# Patient Record
Sex: Male | Born: 1935 | Race: White | Hispanic: No | Smoking: Former smoker
Health system: Southern US, Community
[De-identification: ages and names within clinical notes are randomized; demographics above are authoritative.]

## PROBLEM LIST (undated history)

## (undated) DIAGNOSIS — G4700 Insomnia, unspecified: Secondary | ICD-10-CM

## (undated) DIAGNOSIS — I729 Aneurysm of unspecified site: Secondary | ICD-10-CM

## (undated) DIAGNOSIS — Z5189 Encounter for other specified aftercare: Secondary | ICD-10-CM

## (undated) DIAGNOSIS — I2510 Atherosclerotic heart disease of native coronary artery without angina pectoris: Secondary | ICD-10-CM

## (undated) DIAGNOSIS — C801 Malignant (primary) neoplasm, unspecified: Secondary | ICD-10-CM

## (undated) DIAGNOSIS — E663 Overweight: Secondary | ICD-10-CM

## (undated) DIAGNOSIS — I4510 Unspecified right bundle-branch block: Secondary | ICD-10-CM

## (undated) DIAGNOSIS — I10 Essential (primary) hypertension: Secondary | ICD-10-CM

## (undated) DIAGNOSIS — N189 Chronic kidney disease, unspecified: Secondary | ICD-10-CM

## (undated) DIAGNOSIS — I4891 Unspecified atrial fibrillation: Secondary | ICD-10-CM

## (undated) DIAGNOSIS — G4730 Sleep apnea, unspecified: Secondary | ICD-10-CM

## (undated) DIAGNOSIS — R001 Bradycardia, unspecified: Secondary | ICD-10-CM

## (undated) DIAGNOSIS — K219 Gastro-esophageal reflux disease without esophagitis: Secondary | ICD-10-CM

## (undated) DIAGNOSIS — Z79818 Long term (current) use of other agents affecting estrogen receptors and estrogen levels: Secondary | ICD-10-CM

## (undated) DIAGNOSIS — N62 Hypertrophy of breast: Secondary | ICD-10-CM

## (undated) DIAGNOSIS — C61 Malignant neoplasm of prostate: Secondary | ICD-10-CM

## (undated) DIAGNOSIS — N200 Calculus of kidney: Secondary | ICD-10-CM

## (undated) DIAGNOSIS — I5030 Unspecified diastolic (congestive) heart failure: Secondary | ICD-10-CM

## (undated) DIAGNOSIS — N529 Male erectile dysfunction, unspecified: Secondary | ICD-10-CM

## (undated) DIAGNOSIS — N1330 Unspecified hydronephrosis: Secondary | ICD-10-CM

## (undated) DIAGNOSIS — G43909 Migraine, unspecified, not intractable, without status migrainosus: Secondary | ICD-10-CM

## (undated) DIAGNOSIS — R739 Hyperglycemia, unspecified: Secondary | ICD-10-CM

## (undated) DIAGNOSIS — I639 Cerebral infarction, unspecified: Secondary | ICD-10-CM

## (undated) DIAGNOSIS — R972 Elevated prostate specific antigen [PSA]: Secondary | ICD-10-CM

## (undated) DIAGNOSIS — L719 Rosacea, unspecified: Secondary | ICD-10-CM

## (undated) DIAGNOSIS — Z9289 Personal history of other medical treatment: Secondary | ICD-10-CM

## (undated) DIAGNOSIS — Z8669 Personal history of other diseases of the nervous system and sense organs: Secondary | ICD-10-CM

## (undated) DIAGNOSIS — M8580 Other specified disorders of bone density and structure, unspecified site: Secondary | ICD-10-CM

## (undated) DIAGNOSIS — IMO0002 Reserved for concepts with insufficient information to code with codable children: Secondary | ICD-10-CM

## (undated) DIAGNOSIS — E785 Hyperlipidemia, unspecified: Secondary | ICD-10-CM

## (undated) HISTORY — DX: Other specified disorders of bone density and structure, unspecified site: M85.80

## (undated) HISTORY — DX: Calculus of kidney: N20.0

## (undated) HISTORY — DX: Cerebral infarction, unspecified: I63.9

## (undated) HISTORY — DX: Personal history of other diseases of the nervous system and sense organs: Z86.69

## (undated) HISTORY — DX: Bradycardia, unspecified: R00.1

## (undated) HISTORY — DX: Migraine, unspecified, not intractable, without status migrainosus: G43.909

## (undated) HISTORY — DX: Hypertrophy of breast: N62

## (undated) HISTORY — DX: Unspecified hydronephrosis: N13.30

## (undated) HISTORY — DX: Overweight: E66.3

## (undated) HISTORY — DX: Rosacea, unspecified: L71.9

## (undated) HISTORY — DX: Gastro-esophageal reflux disease without esophagitis: K21.9

## (undated) HISTORY — DX: Elevated prostate specific antigen (PSA): R97.20

## (undated) HISTORY — DX: Malignant (primary) neoplasm, unspecified: C80.1

## (undated) HISTORY — DX: Essential (primary) hypertension: I10

## (undated) HISTORY — DX: Hyperlipidemia, unspecified: E78.5

## (undated) HISTORY — DX: Male erectile dysfunction, unspecified: N52.9

## (undated) HISTORY — DX: Encounter for other specified aftercare: Z51.89

## (undated) HISTORY — DX: Insomnia, unspecified: G47.00

## (undated) HISTORY — DX: Hyperglycemia, unspecified: R73.9

## (undated) HISTORY — DX: Long term (current) use of other agents affecting estrogen receptors and estrogen levels: Z79.818

## (undated) HISTORY — DX: Personal history of other medical treatment: Z92.89

## (undated) HISTORY — DX: Unspecified diastolic (congestive) heart failure: I50.30

## (undated) HISTORY — DX: Chronic kidney disease, unspecified: N18.9

## (undated) HISTORY — PX: CARDIAC CATHETERIZATION: SHX172

## (undated) HISTORY — DX: Aneurysm of unspecified site: I72.9

## (undated) HISTORY — DX: Unspecified right bundle-branch block: I45.10

## (undated) HISTORY — DX: Malignant neoplasm of prostate: C61

## (undated) HISTORY — DX: Unspecified atrial fibrillation: I48.91

## (undated) HISTORY — DX: Reserved for concepts with insufficient information to code with codable children: IMO0002

---

## 1976-07-05 HISTORY — PX: VAGOTOMY: SUR1431

## 1998-10-29 ENCOUNTER — Ambulatory Visit (HOSPITAL_COMMUNITY): Admission: RE | Admit: 1998-10-29 | Discharge: 1998-10-29 | Payer: Self-pay | Admitting: Gastroenterology

## 1998-10-30 ENCOUNTER — Ambulatory Visit (HOSPITAL_COMMUNITY): Admission: RE | Admit: 1998-10-30 | Discharge: 1998-10-30 | Payer: Self-pay | Admitting: Gastroenterology

## 1998-10-31 ENCOUNTER — Ambulatory Visit (HOSPITAL_COMMUNITY): Admission: RE | Admit: 1998-10-31 | Discharge: 1998-10-31 | Payer: Self-pay | Admitting: Gastroenterology

## 1999-10-26 ENCOUNTER — Inpatient Hospital Stay (HOSPITAL_COMMUNITY): Admission: EM | Admit: 1999-10-26 | Discharge: 1999-10-30 | Payer: Self-pay | Admitting: Emergency Medicine

## 1999-10-26 ENCOUNTER — Encounter (INDEPENDENT_AMBULATORY_CARE_PROVIDER_SITE_OTHER): Payer: Self-pay

## 2002-11-27 ENCOUNTER — Inpatient Hospital Stay (HOSPITAL_COMMUNITY): Admission: AC | Admit: 2002-11-27 | Discharge: 2002-11-30 | Payer: Self-pay

## 2002-11-27 ENCOUNTER — Encounter: Payer: Self-pay | Admitting: Emergency Medicine

## 2003-07-23 ENCOUNTER — Ambulatory Visit (HOSPITAL_COMMUNITY): Admission: RE | Admit: 2003-07-23 | Discharge: 2003-07-23 | Payer: Self-pay | Admitting: Cardiology

## 2003-12-09 HISTORY — PX: PROSTATE BIOPSY: SHX241

## 2003-12-19 ENCOUNTER — Ambulatory Visit: Admission: RE | Admit: 2003-12-19 | Discharge: 2004-02-10 | Payer: Self-pay | Admitting: Radiation Oncology

## 2004-07-05 DIAGNOSIS — C801 Malignant (primary) neoplasm, unspecified: Secondary | ICD-10-CM

## 2004-07-05 HISTORY — DX: Malignant (primary) neoplasm, unspecified: C80.1

## 2004-07-29 ENCOUNTER — Ambulatory Visit: Admission: RE | Admit: 2004-07-29 | Discharge: 2004-08-14 | Payer: Self-pay | Admitting: Radiation Oncology

## 2004-10-21 ENCOUNTER — Emergency Department (HOSPITAL_COMMUNITY): Admission: EM | Admit: 2004-10-21 | Discharge: 2004-10-21 | Payer: Self-pay | Admitting: Emergency Medicine

## 2005-04-26 ENCOUNTER — Inpatient Hospital Stay (HOSPITAL_COMMUNITY): Admission: EM | Admit: 2005-04-26 | Discharge: 2005-04-27 | Payer: Self-pay | Admitting: Emergency Medicine

## 2005-04-30 ENCOUNTER — Ambulatory Visit (HOSPITAL_COMMUNITY): Admission: RE | Admit: 2005-04-30 | Discharge: 2005-04-30 | Payer: Self-pay | Admitting: Neurosurgery

## 2005-06-21 ENCOUNTER — Ambulatory Visit (HOSPITAL_COMMUNITY): Admission: RE | Admit: 2005-06-21 | Discharge: 2005-06-21 | Payer: Self-pay | Admitting: Urology

## 2005-07-02 ENCOUNTER — Ambulatory Visit: Admission: RE | Admit: 2005-07-02 | Discharge: 2005-08-12 | Payer: Self-pay | Admitting: Urology

## 2006-09-19 ENCOUNTER — Encounter: Admission: RE | Admit: 2006-09-19 | Discharge: 2006-09-19 | Payer: Self-pay | Admitting: Internal Medicine

## 2007-03-30 ENCOUNTER — Encounter: Admission: RE | Admit: 2007-03-30 | Discharge: 2007-03-30 | Payer: Self-pay | Admitting: Internal Medicine

## 2008-11-25 ENCOUNTER — Emergency Department (HOSPITAL_COMMUNITY): Admission: EM | Admit: 2008-11-25 | Discharge: 2008-11-25 | Payer: Self-pay | Admitting: Emergency Medicine

## 2010-10-13 LAB — COMPREHENSIVE METABOLIC PANEL
ALT: 29 U/L (ref 0–53)
AST: 30 U/L (ref 0–37)
Albumin: 3.6 g/dL (ref 3.5–5.2)
Alkaline Phosphatase: 78 U/L (ref 39–117)
BUN: 21 mg/dL (ref 6–23)
CO2: 27 mEq/L (ref 19–32)
Calcium: 8.8 mg/dL (ref 8.4–10.5)
Chloride: 100 mEq/L (ref 96–112)
Creatinine, Ser: 1.47 mg/dL (ref 0.4–1.5)
GFR calc Af Amer: 57 mL/min — ABNORMAL LOW (ref >60)
GFR calc non Af Amer: 47 mL/min — ABNORMAL LOW (ref >60)
Glucose, Bld: 144 mg/dL — ABNORMAL HIGH (ref 70–99)
Potassium: 3.4 mEq/L — ABNORMAL LOW (ref 3.5–5.1)
Sodium: 136 mEq/L (ref 135–145)
Total Bilirubin: 0.6 mg/dL (ref 0.3–1.2)
Total Protein: 6.8 g/dL (ref 6.0–8.3)

## 2010-10-13 LAB — CBC
HCT: 40.8 % (ref 39.0–52.0)
Hemoglobin: 13.8 g/dL (ref 13.0–17.0)
MCHC: 33.8 g/dL (ref 30.0–36.0)
MCV: 91.1 fL (ref 78.0–100.0)
Platelets: 172 10*3/uL (ref 150–400)
RBC: 4.49 MIL/uL (ref 4.22–5.81)
RDW: 13.3 % (ref 11.5–15.5)
WBC: 10.9 10*3/uL — ABNORMAL HIGH (ref 4.0–10.5)

## 2010-10-13 LAB — URINE CULTURE
Colony Count: NO GROWTH
Culture: NO GROWTH

## 2010-10-13 LAB — URINALYSIS, ROUTINE W REFLEX MICROSCOPIC
Bilirubin Urine: NEGATIVE
Glucose, UA: NEGATIVE mg/dL
Ketones, ur: NEGATIVE mg/dL
Leukocytes, UA: NEGATIVE
Nitrite: NEGATIVE
Protein, ur: NEGATIVE mg/dL
Specific Gravity, Urine: 1.017 (ref 1.005–1.030)
Urobilinogen, UA: 0.2 mg/dL (ref 0.0–1.0)
pH: 5.5 (ref 5.0–8.0)

## 2010-10-13 LAB — DIFFERENTIAL
Basophils Absolute: 0 10*3/uL (ref 0.0–0.1)
Basophils Relative: 0 % (ref 0–1)
Eosinophils Absolute: 0.1 10*3/uL (ref 0.0–0.7)
Eosinophils Relative: 1 % (ref 0–5)
Lymphocytes Relative: 15 % (ref 12–46)
Lymphs Abs: 1.6 10*3/uL (ref 0.7–4.0)
Monocytes Absolute: 0.9 10*3/uL (ref 0.1–1.0)
Monocytes Relative: 8 % (ref 3–12)
Neutro Abs: 8.3 10*3/uL — ABNORMAL HIGH (ref 1.7–7.7)
Neutrophils Relative %: 76 % (ref 43–77)

## 2010-10-13 LAB — LIPASE, BLOOD: Lipase: 18 U/L (ref 11–59)

## 2010-10-13 LAB — URINE MICROSCOPIC-ADD ON

## 2010-11-20 NOTE — Discharge Summary (Signed)
NAME:  MAY, OZMENT NO.:  [id number]   MEDICAL RECORD NO.:  [id number]          PATIENT TYPE:  INP   LOCATION:  1428                         FACILITY:  [facility]   PHYSICIAN:  Hettie Holstein, D.O.    DATE OF BIRTH:  1934-10-01   DATE OF ADMISSION:  04/26/2005  DATE OF DISCHARGE:  04/27/2005                                 DISCHARGE SUMMARY   ADDENDUM:  This is an addendum to the summary as [name] had been  scheduled for follow up on testing after this hospitalization, as he did  rule out for an acute ischemic event for presentation with chest pain.  However on the chest CT ordered for evaluation of non cardiac chest pain  there were incidental yet  concerning findings that prompted an MRI that I  had requested be sent to Dr. Aldean Ast and his cardiologist, who has to a  degree been serving as his primary care physician.  In any event, I called  [name] today as I did review these MRI findings and results and was  informed by him that he had not maintained his follow up appointment with  Dr. Aldean Ast, and I did inform him that these were positive, providing him  with the results, and subsequently I discussed this with Dr. Aldean Ast, who  stated that [name] had previously elected for observation status  regarding previously diagnosed prostate cancer and did refuse yearly  biopsies and optimal monitoring of his prostate cancer.  In any event, the  MRI does reveal a blastic lesion of his L2 vertebra, and I have scheduled an  appointment for him today at 11:30 a.m. at Dr. Valda Lamb office, and I  have conveyed this back to [name] and affirmed the importance of this  follow up and the findings on this MRI.      Hettie Holstein, D.O.  Electronically Signed     ESS/MEDQ  D:  06/17/2005  T:  06/18/2005  Job:  [id number]   cc:   Macarthur Critchley. Shelva Majestic, M.D.  Fax: [phone]   Courtney Paris, M.D.  Fax: [phone]

## 2010-11-20 NOTE — Discharge Summary (Signed)
NAMEMALEKO, GREULICH NO.:  [id number]   MEDICAL RECORD NO.:  [id number]          PATIENT TYPE:  INP   LOCATION:  1428                         FACILITY:  [facility]   PHYSICIAN:  Hettie Holstein, D.O.    DATE OF BIRTH:  24-Dec-1934   DATE OF ADMISSION:  04/26/2005  DATE OF DISCHARGE:  04/27/2005                                 DISCHARGE SUMMARY   ADMISSION DIAGNOSIS:  Chest pain.   PRIMARY CARE PHYSICIAN:  Macarthur Critchley. Shelva Majestic, M.D.  Phone number [phone].   UROLOGIST:  Courtney Paris, M.D.   PRINCIPLE DIAGNOSES:  1.  Chest pain.  No evidence of acute myocardial infarction this admission.  2.  An L2 blastic-appearing lesion noted on the CT scan with a prior history      of prostate cancer.  3.  Fatty liver noted on CT scan.  4.  Hypertension.  5.  Status post history of a partial vagotomy performed in [place].   DISCHARGE MEDICATIONS:  1.  Plavix 75 mg daily.  2.  Lisinopril 20 mg daily, as before.  3.  Atenolol 25 mg daily, as before.  4.  Verapamil 120 mg daily, as before with instructions to stop aspirin if      he develops any bleeding.   He was scheduled for an MRI as an outpatient on April 28, 2005 and was  instructed to follow up with these findings with his primary urologist, Dr.  Aldean Ast, which he already had an appointment scheduled.   HISTORY OF PRESENT ILLNESS:  Mr. Lograsso is a pleasant 76 year old male with  a longstanding history of hypertension, managed by his cardiologist, Dr.  Shelva Majestic, with limited success, according to Mr. Basso, with regard to  managing his hypertension; however, in the department, Mr. Ramsburg's blood  pressure appeared to be well controlled.  He presented with complaints of  two episodes of chest pain and epigastric pressure.  He said that he was  asleep.  This was not accompanied by diaphoresis, shortness of breath, or  radiation.  This did subside eventually.  He walked around without  recurrence and did not pursue  evaluation at that time.  He consulted his own  references and decided to pursue evaluation and proceed to the emergency  department.  In any event, he was admitted for evaluation of acute ischemia.   HOSPITAL COURSE:  His cardiac markers were cycled.  He did have a CT of his  chest and evaluation of noncardiac chest pain.  There was a possible L2  blastic lesion that was not completely imaged.  He was therefore scheduled  for outpatient MRI to follow with his primary care physician as well as  urologist.  His cardiac markers were unremarkable.  He was discharged home  improved in medically stable condition.      Hettie Holstein, D.O.  Electronically Signed     ESS/MEDQ  D:  06/17/2005  T:  06/17/2005  Job:  [id number]   cc:   Macarthur Critchley. Shelva Majestic, M.D.  Fax: [phone]   Courtney Paris, M.D.  Fax: [phone]

## 2010-11-20 NOTE — H&P (Signed)
NAME:  [name], [name] NO.:  [id number]   MEDICAL RECORD NO.:  [id number]          PATIENT TYPE:  INP   LOCATION:  0101                         [facility]ACILITY:  [facility]   PHYSICIAN:  Hettie Holstein, D.O.    DATE OF BIRTH:  December 31, 1934   DATE OF ADMISSION:  04/26/2005  DATE OF DISCHARGE:                                HISTORY & PHYSICAL   PRIMARY CARE PHYSICIAN:  Unassigned. The patient does have a cardiologist in  [facility], Dr. Harland Dingwall, who follows him for refractory hypertension.   CHIEF COMPLAINT:  Chest pain.   HISTORY OF PRESENTING ILLNESS:  [name] is a pleasant 76 year old  Caucasian male with a longstanding history of hypertension managed by a  cardiologist, Dr. Shelva Majestic, who has had limited success, according to Mr.  Mullins. However, here in the emergency department his blood pressure appears  to be adequately controlled on his current regimen. In any event, [name]  presents with complaints of two episodes of chest pain. Initially, on Sunday  morning he was awakened by mid sternal and epigastric chest pressure. He  stated that he was asleep. This was not accompanied by diaphoresis,  shortness of breath, or radiation. He said this subsided shortly thereafter.  He walked around without recurrence and did not pursue evaluation at that  time. He consulted his own health compendium reference and subsequently  decided not to pursue evaluation at that time. Once again, however, this  morning around 3 a.m. he experienced a similar discomfort with description  of chest pressure, someone sitting on his chest. He stated this felt like he  pulled a muscle in his chest and this subsequently disappeared. He presented  to [facility] ED for further evaluation.   In the department, EKG was performed and he had only sinus bradycardia and  some inferior Q's in III and aVF, no evidence of acute ischemia. He did have  some nonspecific T wave abnormalities in III and  aVF. His point of care  markers were negative at 6:20, his chest pain had resolved. Chest x-ray  revealed some atelectasis and no widening of the mediastinum. He is being  admitted for further evaluation.   PAST MEDICAL HISTORY:  Significant for:  1.  Hypertension, refractory to management in the outpatient setting. He      follows with Dr. Harland Dingwall, cardiologist, and has tried multiple      combinations of medications with moderate success, according to Mr.      Mullins.  2.  Prostate cancer, follows with Dr. Aldean Ast, a urologist, with the      current plan that of watchful waiting.  3.  Partial vagotomy in [place] for an ulcer, which [name] states has      been stable. He typically takes capsaicin on a p.r.n. basis when he      feels he has a need.   In review of the E-chart records, [name] has seen Dr. Dara Hoyer for  recurrent duodenal ulcer and H. pylori positive status in the past, and it  was noted that he  did have a selective partial vagotomy in 1975.   MEDICATIONS:  1.  Verapamil 120 mg daily.  2.  Lisinopril 20 mg daily.  3.  Atenolol 25 mg daily.  4.  He takes no aspirin due to previous history of the bleeding ulcer.  5.  Capsaicin as mentioned above.   SOCIAL HISTORY:  The patient denies current tobacco. He smoked a pipe for  about 20 years, though quit 20 years ago. He denied any alcohol. He is  divorced. He has two live children. 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 He currently lives alone. He is originally from [place], then  moved here over 20 years ago.   FAMILY HISTORY:  Mother died at age 33, unknown cause. Father died at age 36  with liver cancer and a known history of alcoholism.   REVIEW OF SYSTEMS:  The patient states that weight and appetite have been  stable. He has had no nausea, vomiting, diarrhea, hematemesis, hematochezia,  melena, and no dysuria, no abdominal discomfort or swelling or edema. He is  able to perform his job  without shortness of breath or chest pain including  ascends multiple flights of stairs and crawls in crawlspaces without  difficulty. Further review of systems is unremarkable.   PHYSICAL EXAMINATION:  VITAL SIGNS:  The patient's blood pressure is 108/63,  heart rate 40s to 50s, respirations 18, temperature 97.6.  GENERAL:  The patient is alert, awake, no acute distress.  NECK:  Supple, nontender. No palpable thyromegaly or mass.  CARDIOVASCULAR:  Reveals normal S1 and S2, a nondisplaced PMI, no evidence  of murmur.  LUNGS:  Clear to auscultation bilaterally with normal effort and no dullness  to percussion.  ABDOMEN:  Soft, obese, nontender. No palpable hepatosplenomegaly or rebound.  No suprapubic or costovertebral angle tenderness. Bowel sounds are  normoactive.  EXTREMITIES:  Reveal no edema. Peripheral pulses are symmetrical and  palpable bilaterally.  NEUROLOGIC:  Reveals the patient to be dysthymic. His affect is stable.  There are no focal deficits on examination.   LABORATORY DATA:  Point of care markers at 6:20 is negative. Sodium 138,  potassium 3.4, BUN 14, creatinine 1.1, glucose 104. WBC 7, hemoglobin 14,  platelet count 205. EKG revealed sinus bradycardia with inferior Q's, age  was indeterminate. Chest x-ray revealed cardiomegaly and bibasilar  atelectasis.   ASSESSMENT:  1.  Atypical chest pain.  2.  Asymptomatic bradycardia, suspect related to prescriptions.  3.  History of bleeding ulcers status post partial vagotomy.  4.  Obesity.   PLAN AT THIS TIME:  We are going to cycle [name]'s cardiac markers, and  anticipate outpatient cardiology follow-up and likely further outpatient  risk stratification for coronary disease.      Hettie Holstein, D.O.  Electronically Signed     ESS/MEDQ  D:  04/26/2005  T:  04/26/2005  Job:  [id number]   cc:   Macarthur Critchley. Shelva Majestic, M.D.  Fax: [phone]

## 2010-11-20 NOTE — Discharge Summary (Signed)
[facility]  Patient:    RIGGIN, CUTTINO                         MRN: [id number] Adm. Date:  [id number] Disc. Date: [id number] Attending:  Judeth Cornfield Dictator:   Mike Gip, P.A. CC:         Wilhemina Bonito. Eda Keys., M.D. [facility]             Luis Abed, M.D. [facility]                           Discharge Summary  ADMITTING DIAGNOSES: 35. A 76 year old white male with recurrent upper GI bleed felt secondary to  recurrent ulcer disease. 2. Status post selective vagotomy in 1975. 3. Questionable history of hypertension.  DISCHARGE DIAGNOSES: 1. Recurrent duodenal ulcer with visible vessel stable status post bicap and  epinephrine injection. Helicobacter pylori positive being treated. 2. Acute upper gastrointestinal bleed secondary to above stable post transfusion. 3. Abnormal EKG with bradycardic and single prolonged pause. Rule out underlying    ischemic heart disease or arrhythmia. 4. Status post selective vagotomy in 1975. 5. Questionable history of hypertension. 6. Anemia secondary to acute bleed improved.  CONSULTATIONS:  Luis Abed, M.D.  PROCEDURE:  Upper endoscopy with therapeutic bicap injection per Dr. Marina Goodell.  BRIEF HISTORY:  Mr. Chauncey Mann is a very nice 76 year old white male known to Dr. Arlyce Dice who has a history of recurrent peptic ulcer disease and upper GI bleeds. He is status post vagotomy in 1975 which was done in [place]. At this time, he presented with a two day history of melena. He said the day prior to admission, he had two black stools and on the day of admission had had two black stools. He was complaining of dizziness and light-headedness. He has not been currently on any  aspirin or NSAIDs, no regular ETOH. The patient had no complaint of chest pain r shortness of breath at the time of admission. He had been evaluated in April of  last year per Dr. Arlyce Dice for severe anemia. At that time, he was found to have active  duodenal ulcers without stigmata of active hemorrhage and a scarred bulb. He had colonoscopy as well showing diverticular disease. He had been placed on PPI  and required transfusion x 4. Unfortunately, he has not continued to take chronic PPI and had been taking an herbal remedy called ______  when he first noted his dark stool. Evaluated this time by Dr. Arlyce Dice in the emergency room and admitted with recurrent acute bleed for further diagnostic evaluation. Hemoglobin at the time of admission 11.4, hematocrit of 32.9.  LABORATORY DATA:  Again on admission April 23, WBC of 8.3, hemoglobin of 11.4, hematocrit of 32.9, MCV of 84, platelets of 193. Serial values were obtained on  April 24, hemoglobin dropped to 8.1, hematocrit of 23.9. He was transfused and n April 25, hemoglobin was 9.2, hematocrit of 26.4. It once again drifted to 8.8 nd 25.3. He was transfused again and on April 26, hemoglobin was 10.9, hematocrit f 31.2. Follow-up the following morning was 10.9, hematocrit of 32.9, stable. Protime 14.3, INR of 1.2, PTT of 25. Electrolytes on admission showed a sodium of 152, potassium 4.7, glucose of 133, BUN 37, creatinine 0.9. Follow-up on April 26, electrolytes within normal limits, potassium 3.9., BUN 11, creatinine 1.2. Calcium was 8, albumin 3.6. Again liver function studies were within  normal limits. CK-MBs negative x 2. Troponin negative x 2. TSH 2.37, gastrin level was 34, salicylate  level less than 4. H. pylori antibody was positive at 2.2. EKG on April 26 showed a normal sinus rhythm with an incomplete right bundle branch block and an ST/T wave abnormality, consider anterolateral ischemia. On the morning of April 24, he also had a 4.2 second pause and a few premature ventricular contractions.  HOSPITAL COURSE:  The patient was admitted to the service of Dr. Melvia Heaps nd then cared for by Dr. Marina Goodell who was covering the hospital. On admission, he was  placed  at bed rest, placed on IV fluids, IV Pepcid and serial H&Hs. He was scheduled to undergo upper endoscopy the following morning. This was done per Dr. Marina Goodell. There was no evidence of active bleeding but hews found to have a duodenal bulb ulcer with a visible vessel and two superficial antral ulcers. He was injected with epinephrine into the bulbar ulcer and bicapped. His hemoglobin had drifted  during the night and he was transfused two units packed red blood cells. The patient had a stable hospital course thereafter without any evidence for further active bleeding though his hemoglobin did slowly drift again and he required two further units prior to discharge. On the morning of October 29, 1999 he was noted to have a four second pause on telemetry and occasion premature ventricular contractions. He was asymptomatic, his blood pressure was stable at that time. e did obtain an EKG which was abnormal as outlined above and obtained a cardiology consultation. He was continued on telemetry. His H. pylori antibody returned positive and he was begun on a PrevPak. On that same day, his hemoglobin had drifted a bit further and that was when he was transfused two further units of packed cells. He was seen in consultation by Dr. Myrtis Ser who felt that he should have outpatient 2-D echo and 24 hour Holter as well as EP studies in the office. initially, he was to have his echo done in the hospital; however, due to scheduling difficulties it was decided to arrange all of his cardiac workup as an outpatient. Dr. Myrtis Ser was in agreement with this. He was scheduled for 2-D echo and stress Cardiolite on Nov 11, 1999 at the [place] office and then follow-up office visit  with Dr. Myrtis Ser on Nov 17, 1999. EP workup was to be completed pending results of  above. On the morning of October 30, 1999 he was stable and allowed discharge to ome with instructions to follow-up for his 2D echo and Cardiolite on May 9 and  then his appointment with Dr. Myrtis Ser on May 15 at 10:15 a.m. He was to see Dr. Arlyce Dice on Nov 12, 1999 at 1:45 p.m. He was to call us in the interim for any evidence of further  recurrent bleeding or any other problems. He was advised to take it easy and not go to work for the week after hospital discharge.  DISCHARGE MEDICATIONS:  PrevPak daily x 12 days and then Protonix 40 mg q.d. for at least three months. This patient may benefit from chronic PPI therapy given the  recurrent nature of his disease. He was also advised to avoid all aspirin and NSAID products. His gastrin level again was normal at 34.DD:  11/05/99 TD:  11/07/99 Job: [id number]
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
## 2011-04-21 ENCOUNTER — Ambulatory Visit
Admission: RE | Admit: 2011-04-21 | Discharge: 2011-04-21 | Disposition: A | Discharge status: Home / Self Care | Payer: Medicare Other | Source: Ambulatory Visit | Attending: Radiation Oncology | Admitting: Radiation Oncology

## 2011-04-21 DIAGNOSIS — C61 Malignant neoplasm of prostate: Secondary | ICD-10-CM | POA: Insufficient documentation

## 2011-04-21 DIAGNOSIS — C801 Malignant (primary) neoplasm, unspecified: Secondary | ICD-10-CM | POA: Insufficient documentation

## 2011-04-21 DIAGNOSIS — N62 Hypertrophy of breast: Secondary | ICD-10-CM | POA: Insufficient documentation

## 2011-04-21 HISTORY — DX: Hypertrophy of breast: N62

## 2011-12-17 ENCOUNTER — Encounter: Payer: Self-pay | Admitting: *Deleted

## 2012-01-17 DIAGNOSIS — R972 Elevated prostate specific antigen [PSA]: Secondary | ICD-10-CM

## 2012-01-17 HISTORY — DX: Elevated prostate specific antigen (PSA): R97.20

## 2012-02-08 ENCOUNTER — Other Ambulatory Visit (HOSPITAL_COMMUNITY): Payer: Self-pay | Admitting: Urology

## 2012-02-08 DIAGNOSIS — C61 Malignant neoplasm of prostate: Secondary | ICD-10-CM

## 2012-02-16 ENCOUNTER — Encounter (HOSPITAL_COMMUNITY)
Admission: RE | Admit: 2012-02-16 | Discharge: 2012-02-16 | Disposition: A | Discharge status: Home / Self Care | Payer: Medicare Other | Source: Ambulatory Visit | Attending: Urology | Admitting: Urology

## 2012-02-16 DIAGNOSIS — C61 Malignant neoplasm of prostate: Secondary | ICD-10-CM | POA: Insufficient documentation

## 2012-02-16 MED ORDER — TECHNETIUM TC 99M MEDRONATE IV KIT
25.0000 | PACK | Freq: Once | INTRAVENOUS | Status: AC | PRN
  Administered 2012-02-16: 25 via INTRAVENOUS

## 2012-03-20 ENCOUNTER — Encounter: Payer: Self-pay | Admitting: *Deleted

## 2012-03-20 DIAGNOSIS — C801 Malignant (primary) neoplasm, unspecified: Secondary | ICD-10-CM | POA: Insufficient documentation

## 2012-03-20 DIAGNOSIS — C61 Malignant neoplasm of prostate: Secondary | ICD-10-CM | POA: Insufficient documentation

## 2012-03-20 NOTE — Progress Notes (Signed)
Patient recon Prostate Cancer, Dx:6/62005 Adenocarcinoma,gleason=3+3=6,PSa=4.6 PSA 01/17/12=10.38 PSA 10/08/11=6.96 PSA 07/08/11= 6.26 L-2 solitary metastatic lesion  In 2006 seen by Dr.Murray, asymptomatic, no radiation On Lupron 06/2005-11/2006,intermittent ADT , Degarelix 02/2008 x3 Last Lupron 22.5mg  IM given 01/31/12  Gynecomastia  04/21/11 seen by Dr.Murray, no radiation  Alert,oriented x3, bowels normal, has frequency voids q 1 hour, no dysuria, nocturia x3 Sees Dr. Georgina Pillion in a couple weeks

## 2012-03-21 ENCOUNTER — Encounter: Payer: Self-pay | Admitting: Radiation Oncology

## 2012-03-21 ENCOUNTER — Ambulatory Visit
Admission: RE | Admit: 2012-03-21 | Discharge: 2012-03-21 | Disposition: A | Discharge status: Home / Self Care | Payer: Medicare Other | Source: Ambulatory Visit | Attending: Radiation Oncology | Admitting: Radiation Oncology

## 2012-03-21 VITALS — BP 186/93 | HR 50 | Temp 97.5°F | Resp 20 | Wt 227.8 lb

## 2012-03-21 DIAGNOSIS — C61 Malignant neoplasm of prostate: Secondary | ICD-10-CM | POA: Insufficient documentation

## 2012-03-21 DIAGNOSIS — N62 Hypertrophy of breast: Secondary | ICD-10-CM | POA: Insufficient documentation

## 2012-03-21 NOTE — Progress Notes (Signed)
Please see the Nurse Progress Note in the MD Initial Consult Encounter for this patient. 

## 2012-03-21 NOTE — Progress Notes (Signed)
Followup note:  The patient returns today for followup of his carcinoma the prostate. I saw the patient for a followup visit in October 2012 for discussion of possible lactic rest radiation to prevent painful gynecomastia. He declined breast radiation. He is been on intermittent Lupron since December 2006. He was given Deborra Medina for 3 doses in August x3 doses. He was seen by Dr. Margarita Grizzle on 01/31/2012 in his PSA was noted to have risen to 10.38 from a value of 6.96 back in April 2013. He was restarted on Lupron and one month of Casodex. During this time he noted some intermittent bilateral breast discomfort. He is having less breast discomfort this week compared to the past few weeks. He is currently on Lupron alone.  A physical examination: Alert and oriented. Wt Readings from Last 3 Encounters:  03/21/12 227 lb 12.8 oz (103.329 kg)   Temp Readings from Last 3 Encounters:  03/21/12 97.5 F (36.4 C) Oral   BP Readings from Last 3 Encounters:  03/21/12 186/93   Pulse Readings from Last 3 Encounters:  03/21/12 50   Breast examination: He does have bilateral gynecomastia with essentially fatty breast. There is no significant glandular hyperplasia appear  Impression: Bilateral gynecomastia with transient discomfort presumably related to his recent Casodex which has now been discontinued . I think there is little benefit for prophylactic breast radiation unless he is on total androgen deprivation for a period of time.  Plan: He'll be followed by Dr. Margarita Grizzle.

## 2012-04-03 NOTE — Addendum Note (Signed)
Encounter addended by: Kayshaun Polanco Mintz Shoji Pertuit, RN on: 04/03/2012  7:43 PM<BR>     Documentation filed: Charges VN

## 2014-01-17 ENCOUNTER — Encounter: Payer: Self-pay | Admitting: Gastroenterology

## 2014-02-28 ENCOUNTER — Other Ambulatory Visit: Payer: Self-pay | Admitting: Otolaryngology

## 2014-02-28 DIAGNOSIS — H905 Unspecified sensorineural hearing loss: Secondary | ICD-10-CM

## 2014-02-28 DIAGNOSIS — H903 Sensorineural hearing loss, bilateral: Secondary | ICD-10-CM

## 2014-03-18 ENCOUNTER — Ambulatory Visit
Admission: RE | Admit: 2014-03-18 | Discharge: 2014-03-18 | Disposition: A | Discharge status: Home / Self Care | Payer: Medicare Other | Source: Ambulatory Visit | Attending: Otolaryngology | Admitting: Otolaryngology

## 2014-03-18 DIAGNOSIS — H903 Sensorineural hearing loss, bilateral: Secondary | ICD-10-CM

## 2014-03-18 DIAGNOSIS — H905 Unspecified sensorineural hearing loss: Secondary | ICD-10-CM

## 2014-03-18 MED ORDER — GADOBENATE DIMEGLUMINE 529 MG/ML IV SOLN
20.0000 mL | Freq: Once | INTRAVENOUS | Status: AC | PRN
  Administered 2014-03-18: 20 mL via INTRAVENOUS

## 2014-03-19 ENCOUNTER — Ambulatory Visit (AMBULATORY_SURGERY_CENTER): Payer: Self-pay | Admitting: *Deleted

## 2014-03-19 VITALS — Ht 73.0 in | Wt 237.4 lb

## 2014-03-19 DIAGNOSIS — Z1211 Encounter for screening for malignant neoplasm of colon: Secondary | ICD-10-CM

## 2014-03-19 NOTE — Progress Notes (Signed)
No allergies to eggs or soy. No problems with anesthesia.  Pt given Emmi instructions for colonoscopy  No oxygen use  No diet drug use  

## 2014-03-28 ENCOUNTER — Encounter: Payer: Self-pay | Admitting: Gastroenterology

## 2014-04-02 ENCOUNTER — Ambulatory Visit (AMBULATORY_SURGERY_CENTER): Payer: Medicare Other | Admitting: Gastroenterology

## 2014-04-02 ENCOUNTER — Encounter: Payer: Self-pay | Admitting: Gastroenterology

## 2014-04-02 VITALS — BP 186/82 | HR 57 | Temp 97.1°F | Resp 17 | Ht 73.0 in | Wt 237.0 lb

## 2014-04-02 DIAGNOSIS — K5730 Diverticulosis of large intestine without perforation or abscess without bleeding: Secondary | ICD-10-CM

## 2014-04-02 DIAGNOSIS — D126 Benign neoplasm of colon, unspecified: Secondary | ICD-10-CM

## 2014-04-02 DIAGNOSIS — Z1211 Encounter for screening for malignant neoplasm of colon: Secondary | ICD-10-CM

## 2014-04-02 MED ORDER — SODIUM CHLORIDE 0.9 % IV SOLN: 500.0000 mL | INTRAVENOUS | Status: DC

## 2014-04-02 NOTE — Patient Instructions (Signed)
YOU HAD AN ENDOSCOPIC PROCEDURE TODAY AT THE [facility]: Refer to the procedure report that was given to you for any specific questions about what was found during the examination.  If the procedure report does not answer your questions, please call your gastroenterologist to clarify.  If you requested that your care partner not be given the details of your procedure findings, then the procedure report has been included in a sealed envelope for you to review at your convenience later.  YOU SHOULD EXPECT: Some feelings of bloating in the abdomen. Passage of more gas than usual.  Walking can help get rid of the air that was put into your GI tract during the procedure and reduce the bloating. If you had a lower endoscopy (such as a colonoscopy or flexible sigmoidoscopy) you may notice spotting of blood in your stool or on the toilet paper. If you underwent a bowel prep for your procedure, then you may not have a normal bowel movement for a few days.  DIET: Your first meal following the procedure should be a light meal and then it is ok to progress to your normal diet.  A half-sandwich or bowl of soup is an example of a good first meal.  Heavy or fried foods are harder to digest and may make you feel nauseous or bloated.  Likewise meals heavy in dairy and vegetables can cause extra gas to form and this can also increase the bloating.  Drink plenty of fluids but you should avoid alcoholic beverages for 24 hours.  ACTIVITY: Your care partner should take you home directly after the procedure.  You should plan to take it easy, moving slowly for the rest of the day.  You can resume normal activity the day after the procedure however you should NOT DRIVE or use heavy machinery for 24 hours (because of the sedation medicines used during the test).    SYMPTOMS TO REPORT IMMEDIATELY: A gastroenterologist can be reached at any hour.  During normal business hours, 8:30 AM to 5:00 PM Monday through Friday,  call [phone].  After hours and on weekends, please call the GI answering service at [phone] who will take a message and have the physician on call contact you.   Following lower endoscopy (colonoscopy or flexible sigmoidoscopy):  Excessive amounts of blood in the stool  Significant tenderness or worsening of abdominal pains  Swelling of the abdomen that is new, acute  Fever of 100F or higher    FOLLOW UP: If any biopsies were taken you will be contacted by phone or by letter within the next 1-3 weeks.  Call your gastroenterologist if you have not heard about the biopsies in 3 weeks.  Our staff will call the home number listed on your records the next business day following your procedure to check on you and address any questions or concerns that you may have at that time regarding the information given to you following your procedure. This is a courtesy call and so if there is no answer at the home number and we have not heard from you through the emergency physician on call, we will assume that you have returned to your regular daily activities without incident.  SIGNATURES/CONFIDENTIALITY: You and/or your care partner have signed paperwork which will be entered into your electronic medical record.  These signatures attest to the fact that that the information above on your After Visit Summary has been reviewed and is understood.  Full responsibility of the confidentiality   of this discharge information lies with you and/or your care-partner.   Information on polyps ,diverticulosis ,& high fiber diet given to you today 

## 2014-04-02 NOTE — Progress Notes (Signed)
Patient awakening,vss,report to rn 

## 2014-04-02 NOTE — Op Note (Signed)
[facility]  [address] [place], [address]   COLONOSCOPY PROCEDURE REPORT  PATIENT: [name], [name]  MR#: [id number] BIRTHDATE: 1935/04/25 , 78  yrs. old GENDER: male ENDOSCOPIST: Inda Castle, MD REFERRED LA:GTXM [name], M.D. PROCEDURE DATE:  04/02/2014 PROCEDURE:   Colonoscopy with snare polypectomy First Screening Colonoscopy - Avg.  risk and is 50 yrs.  old or older - No.  Prior Negative Screening - Now for repeat screening. 10 or more years since last screening  History of Adenoma - Now for follow-up colonoscopy & has been > or = to 3 yrs.  N/A  Polyps Removed Today? Yes. ASA CLASS:   Class II INDICATIONS:average risk for colon cancer. MEDICATIONS: Monitored anesthesia care and Propofol 200 mg IV  DESCRIPTION OF PROCEDURE:   After the risks benefits and alternatives of the procedure were thoroughly explained, informed consent was obtained.  The digital rectal exam revealed no abnormalities of the rectum.   The LB [id number]  endoscope was introduced through the anus and advanced to the cecum, which was identified by both the appendix and ileocecal valve. No adverse events experienced.   The quality of the prep was Suprep good  The instrument was then slowly withdrawn as the colon was fully examined.      COLON FINDINGS: A sessile polyp measuring 3 mm in size was found in the sigmoid colon.  A polypectomy was performed with a cold snare. The resection was complete, the polyp tissue was completely retrieved and sent to histology.   There was moderate diverticulosis noted in the sigmoid colon.   The colon mucosa was otherwise normal.  Retroflexed views revealed no abnormalities. The time to cecum=3 minutes 27 seconds.  Withdrawal time=8 minutes 23 seconds.  The scope was withdrawn and the procedure completed. COMPLICATIONS: There were no immediate complications.  ENDOSCOPIC IMPRESSION: 1.   Sessile polyp measuring 3 mm in size was found in the  sigmoid colon; polypectomy was performed with a cold snare 2.   Moderate diverticulosis was noted in the sigmoid colon 3.   The colon mucosa was otherwise normal  RECOMMENDATIONS: Given your age, you will not need another colonoscopy for colon cancer screening or polyp surveillance.  These types of tests usually stop around the age 72.  eSigned:  Inda Castle, MD 04/02/2014 10:14 AM   cc:   PATIENT NAME:  [name], [name] MR#: [id number]

## 2014-04-02 NOTE — Progress Notes (Signed)
Called to room to assist during endoscopic procedure.  Patient ID and intended procedure confirmed with present staff. Received instructions for my participation in the procedure from the performing physician.  

## 2014-04-03 ENCOUNTER — Telehealth: Payer: Self-pay | Admitting: *Deleted

## 2014-04-03 NOTE — Telephone Encounter (Signed)
  Follow up Call-  Call back number 04/02/2014  Post procedure Call Back phone  # [phone]  Permission to leave phone message Yes     Patient questions:  Do you have a fever, pain , or abdominal swelling? No. Pain Score  0 *  Have you tolerated food without any problems? Yes.    Have you been able to return to your normal activities? Yes.    Do you have any questions about your discharge instructions: Diet   No. Medications  No. Follow up visit  No.  Do you have questions or concerns about your Care? No.  Actions: * If pain score is 4 or above: No action needed, pain <4.

## 2014-04-08 ENCOUNTER — Encounter: Payer: Self-pay | Admitting: Gastroenterology

## 2014-10-20 ENCOUNTER — Emergency Department (HOSPITAL_COMMUNITY): Payer: Medicare Other

## 2014-10-20 ENCOUNTER — Observation Stay (HOSPITAL_COMMUNITY)
Admission: EM | Admit: 2014-10-20 | Discharge: 2014-10-22 | Disposition: A | Discharge status: Home / Self Care | Payer: Medicare Other | Attending: Internal Medicine | Admitting: Internal Medicine

## 2014-10-20 ENCOUNTER — Encounter (HOSPITAL_COMMUNITY): Payer: Self-pay | Admitting: Physical Medicine and Rehabilitation

## 2014-10-20 ENCOUNTER — Observation Stay (HOSPITAL_COMMUNITY): Payer: Medicare Other

## 2014-10-20 DIAGNOSIS — I129 Hypertensive chronic kidney disease with stage 1 through stage 4 chronic kidney disease, or unspecified chronic kidney disease: Secondary | ICD-10-CM | POA: Diagnosis not present

## 2014-10-20 DIAGNOSIS — Z5189 Encounter for other specified aftercare: Secondary | ICD-10-CM | POA: Insufficient documentation

## 2014-10-20 DIAGNOSIS — R55 Syncope and collapse: Secondary | ICD-10-CM | POA: Diagnosis not present

## 2014-10-20 DIAGNOSIS — I160 Hypertensive urgency: Secondary | ICD-10-CM | POA: Diagnosis present

## 2014-10-20 DIAGNOSIS — N182 Chronic kidney disease, stage 2 (mild): Secondary | ICD-10-CM

## 2014-10-20 DIAGNOSIS — N189 Chronic kidney disease, unspecified: Secondary | ICD-10-CM | POA: Diagnosis not present

## 2014-10-20 DIAGNOSIS — H538 Other visual disturbances: Secondary | ICD-10-CM | POA: Diagnosis not present

## 2014-10-20 DIAGNOSIS — I4510 Unspecified right bundle-branch block: Secondary | ICD-10-CM | POA: Diagnosis not present

## 2014-10-20 DIAGNOSIS — I639 Cerebral infarction, unspecified: Secondary | ICD-10-CM | POA: Diagnosis present

## 2014-10-20 DIAGNOSIS — Z8546 Personal history of malignant neoplasm of prostate: Secondary | ICD-10-CM | POA: Insufficient documentation

## 2014-10-20 DIAGNOSIS — I10 Essential (primary) hypertension: Secondary | ICD-10-CM

## 2014-10-20 DIAGNOSIS — Z79818 Long term (current) use of other agents affecting estrogen receptors and estrogen levels: Secondary | ICD-10-CM | POA: Insufficient documentation

## 2014-10-20 DIAGNOSIS — R972 Elevated prostate specific antigen [PSA]: Secondary | ICD-10-CM | POA: Diagnosis not present

## 2014-10-20 DIAGNOSIS — Z8673 Personal history of transient ischemic attack (TIA), and cerebral infarction without residual deficits: Secondary | ICD-10-CM | POA: Insufficient documentation

## 2014-10-20 DIAGNOSIS — Z7982 Long term (current) use of aspirin: Secondary | ICD-10-CM | POA: Insufficient documentation

## 2014-10-20 DIAGNOSIS — Z87891 Personal history of nicotine dependence: Secondary | ICD-10-CM | POA: Diagnosis not present

## 2014-10-20 DIAGNOSIS — R001 Bradycardia, unspecified: Secondary | ICD-10-CM | POA: Diagnosis present

## 2014-10-20 DIAGNOSIS — N1330 Unspecified hydronephrosis: Secondary | ICD-10-CM | POA: Insufficient documentation

## 2014-10-20 DIAGNOSIS — N1831 Chronic kidney disease, stage 3a: Secondary | ICD-10-CM | POA: Insufficient documentation

## 2014-10-20 DIAGNOSIS — I493 Ventricular premature depolarization: Secondary | ICD-10-CM | POA: Diagnosis present

## 2014-10-20 DIAGNOSIS — Z7901 Long term (current) use of anticoagulants: Secondary | ICD-10-CM | POA: Diagnosis not present

## 2014-10-20 DIAGNOSIS — E785 Hyperlipidemia, unspecified: Secondary | ICD-10-CM | POA: Insufficient documentation

## 2014-10-20 DIAGNOSIS — C61 Malignant neoplasm of prostate: Secondary | ICD-10-CM | POA: Diagnosis present

## 2014-10-20 DIAGNOSIS — N183 Chronic kidney disease, stage 3 unspecified: Secondary | ICD-10-CM | POA: Insufficient documentation

## 2014-10-20 HISTORY — DX: Sleep apnea, unspecified: G47.30

## 2014-10-20 LAB — BASIC METABOLIC PANEL
Anion gap: 11 (ref 5–15)
BUN: 18 mg/dL (ref 6–23)
CO2: 28 mmol/L (ref 19–32)
Calcium: 9.1 mg/dL (ref 8.4–10.5)
Chloride: 100 mmol/L (ref 96–112)
Creatinine, Ser: 1.02 mg/dL (ref 0.50–1.35)
GFR calc Af Amer: 80 mL/min — ABNORMAL LOW (ref >90)
GFR calc non Af Amer: 69 mL/min — ABNORMAL LOW (ref >90)
Glucose, Bld: 140 mg/dL — ABNORMAL HIGH (ref 70–99)
POTASSIUM: 3.4 mmol/L — AB (ref 3.5–5.1)
SODIUM: 139 mmol/L (ref 135–145)

## 2014-10-20 LAB — CBC
HCT: 43.4 % (ref 39.0–52.0)
Hemoglobin: 14.2 g/dL (ref 13.0–17.0)
MCH: 28.7 pg (ref 26.0–34.0)
MCHC: 32.7 g/dL (ref 30.0–36.0)
MCV: 87.7 fL (ref 78.0–100.0)
PLATELETS: 168 10*3/uL (ref 150–400)
RBC: 4.95 MIL/uL (ref 4.22–5.81)
RDW: 14.2 % (ref 11.5–15.5)
WBC: 6.8 10*3/uL (ref 4.0–10.5)

## 2014-10-20 LAB — I-STAT TROPONIN, ED: Troponin i, poc: 0 ng/mL (ref 0.00–0.08)

## 2014-10-20 LAB — BRAIN NATRIURETIC PEPTIDE: B Natriuretic Peptide: 78.1 pg/mL (ref 0.0–100.0)

## 2014-10-20 MED ORDER — CARVEDILOL 12.5 MG PO TABS
12.5000 mg | ORAL_TABLET | Freq: Two times a day (BID) | ORAL | Status: DC
  Filled 2014-10-20 (×3): qty 1

## 2014-10-20 MED ORDER — LISINOPRIL 20 MG PO TABS
20.0000 mg | ORAL_TABLET | Freq: Two times a day (BID) | ORAL | Status: DC
  Administered 2014-10-20 – 2014-10-22 (×4): 20 mg via ORAL
  Filled 2014-10-20 (×5): qty 1

## 2014-10-20 MED ORDER — HYDRALAZINE HCL 25 MG PO TABS
25.0000 mg | ORAL_TABLET | Freq: Two times a day (BID) | ORAL | Status: DC
  Administered 2014-10-20 – 2014-10-22 (×4): 25 mg via ORAL
  Filled 2014-10-20 (×5): qty 1

## 2014-10-20 MED ORDER — PANTOPRAZOLE SODIUM 40 MG PO TBEC
40.0000 mg | DELAYED_RELEASE_TABLET | Freq: Every day | ORAL | Status: DC
  Administered 2014-10-20 – 2014-10-22 (×3): 40 mg via ORAL
  Filled 2014-10-20 (×3): qty 1

## 2014-10-20 MED ORDER — HEPARIN SODIUM (PORCINE) 5000 UNIT/ML IJ SOLN
5000.0000 [IU] | Freq: Three times a day (TID) | INTRAMUSCULAR | Status: DC
  Administered 2014-10-20 – 2014-10-22 (×6): 5000 [IU] via SUBCUTANEOUS
  Filled 2014-10-20 (×7): qty 1

## 2014-10-20 MED ORDER — SODIUM CHLORIDE 0.9 % IJ SOLN
3.0000 mL | Freq: Two times a day (BID) | INTRAMUSCULAR | Status: DC
  Administered 2014-10-20 – 2014-10-22 (×4): 3 mL via INTRAVENOUS

## 2014-10-20 NOTE — ED Provider Notes (Signed)
CSN: [id number]     Arrival date & time 10/20/14  1216 History   First MD Initiated Contact with Patient 10/20/14 1218     Chief Complaint  Patient presents with  . Loss of Consciousness     (Consider location/radiation/quality/duration/timing/severity/associated sxs/prior Treatment) Patient is a 80 y.o. male presenting with syncope. The history is provided by the patient.  Loss of Consciousness Episode history:  Single Most recent episode:  Today Duration:  5 minutes Timing:  Constant Progression:  Unchanged Chronicity:  New Context: sitting down (was sitting down in church)   Witnessed: no   Relieved by:  Nothing Worsened by:  Nothing tried Associated symptoms: dizziness   Associated symptoms: no chest pain, no fever, no shortness of breath and no vomiting     Past Medical History  Diagnosis Date  . Hypertension   . Ulcer     peptic  . Chronic kidney disease     nephrolithiasis right kidney  . Use of leuprolide acetate (Lupron) 06/2005-11/2006    02/2008 degarelix 02/2008 x 3 doses  . Prostate cancer 12/09/03 dx    Prostate ca/adenocarcinoma,gleason=3+3=6    pSA 4.6  . Gynecomastia 04/21/11    seen by Dr.Murray, no rad tx  . Hydroureteronephrosis     right   . Osteopenia   . Cancer 2006    L-2 solitary metastatic lesion/no rad tx ,asymptomatic  . Abnormal PSA 01/17/2012    10.38/4/5/ PSA:2013=6.96/ PSA 07/08/2011=6.26/ PSA 2006=8.6  . ED (erectile dysfunction)   . Blood transfusion without reported diagnosis     during vagotomy 1976   Past Surgical History  Procedure Laterality Date  . Prostate biopsy  12/09/03    adenocarcinoma,glerason: 3+3=6  . Vagotomy  1978    partial   Family History  Problem Relation Age of Onset  . Colon cancer Neg Hx    History  Substance Use Topics  . Smoking status: Former Smoker    Types: Pipe    Quit date: 07/05/1984  . Smokeless tobacco: Never Used  . Alcohol Use: No    Review of Systems  Constitutional: Negative for fever  and chills.  Respiratory: Negative for shortness of breath.   Cardiovascular: Positive for syncope. Negative for chest pain and leg swelling.  Gastrointestinal: Negative for vomiting and abdominal pain.  Neurological: Positive for dizziness.  All other systems reviewed and are negative.     Allergies  Review of patient's allergies indicates no known allergies.  Home Medications   Prior to Admission medications   Medication Sig Start Date End Date Taking? Authorizing Provider  alendronate (FOSAMAX) 70 MG tablet Take 70 mg by mouth once a week. 10/01/14   Historical Provider, MD  Bisacodyl (DULCOLAX PO) Take by mouth. Dulcolax as directed for colonoscopy prep    Historical Provider, MD  Calcium-Vitamin D (CALTRATE 600 PLUS-VIT D PO) Take 1 tablet by mouth daily.     Historical Provider, MD  carvedilol (COREG) 12.5 MG tablet Take 12.5 mg by mouth 2 (two) times daily with a meal.    Historical Provider, MD  cholecalciferol (VITAMIN D) 1000 UNITS tablet Take 1,000 Units by mouth daily.    Historical Provider, MD  GRAPE SEED EXTRACT PO Take 2 capsules by mouth 2 (two) times daily.    Historical Provider, MD  hydrALAZINE (APRESOLINE) 25 MG tablet Take 25 mg by mouth 2 (two) times daily. 10/14/14   Historical Provider, MD  hydrochlorothiazide (HYDRODIURIL) 25 MG tablet Take 25 mg by mouth daily. 09/30/14  Historical Provider, MD  lisinopril (PRINIVIL,ZESTRIL) 40 MG tablet Take 40 mg by mouth 2 (two) times daily.     Historical Provider, MD  Magnesium 400 MG TABS Take by mouth daily.    Historical Provider, MD  saw palmetto 160 MG capsule Take 160 mg by mouth daily.    Historical Provider, MD  Selenium 200 MCG CAPS Take by mouth daily.    Historical Provider, MD  simvastatin (ZOCOR) 20 MG tablet Take 20 mg by mouth daily. 09/30/14   Historical Provider, MD  Turmeric Curcumin 500 MG CAPS Take by mouth daily.    Historical Provider, MD  [place] Name: BITTER APRICOT.  TAKES 500 MG TWICE  DAILY    Historical Provider, MD  verapamil (CALAN-SR) 120 MG CR tablet Take 120 mg by mouth daily. 09/30/14   Historical Provider, MD  vitamin C (ASCORBIC ACID) 500 MG tablet Take 1,000 mg by mouth daily.     Historical Provider, MD  vitamin E 400 UNIT capsule Take 400 Units by mouth daily.    Historical Provider, MD  zinc gluconate 50 MG tablet Take 50 mg by mouth daily.    Historical Provider, MD   BP 176/96 mmHg  Pulse 61  Temp(Src) 98.7 F (37.1 C) (Oral)  Resp 18  SpO2 97% Physical Exam  Constitutional: He is oriented to person, place, and time. He appears well-developed and well-nourished. No distress.  HENT:  Head: Normocephalic and atraumatic.  Mouth/Throat: No oropharyngeal exudate.  Eyes: EOM are normal. Pupils are equal, round, and reactive to light.  Neck: Normal range of motion. Neck supple.  Cardiovascular: Normal rate and regular rhythm.  Exam reveals no friction rub.   No murmur heard. Pulmonary/Chest: Effort normal and breath sounds normal. No respiratory distress. He has no wheezes. He has no rales.  Abdominal: He exhibits no distension. There is no tenderness. There is no rebound.  Musculoskeletal: Normal range of motion. He exhibits no edema.  Neurological: He is alert and oriented to person, place, and time.  Skin: He is not diaphoretic.  Nursing note and vitals reviewed.   ED Course  Procedures (including critical care time) Labs Review Labs Reviewed  BASIC METABOLIC PANEL - Abnormal; Notable for the following:    Potassium 3.4 (*)    Glucose, Bld 140 (*)    GFR calc non Af Amer 69 (*)    GFR calc Af Amer 80 (*)    All other components within normal limits  CBC  BRAIN NATRIURETIC PEPTIDE  I-STAT TROPOININ, ED    Imaging Review Dg Chest 2 View  10/20/2014   CLINICAL DATA:  Syncope.  History of hypertension.  EXAM: CHEST  2 VIEW  COMPARISON:  04/27/2007.  FINDINGS: Borderline enlarged cardiac silhouette with an interval decrease in size. Clear lungs.  Mildly prominent pulmonary vasculature without significant change. Mild central peribronchial thickening. Mild thoracic spine degenerative changes. Upper abdominal surgical clips.  IMPRESSION: 1. No acute abnormality. 2. Borderline cardiomegaly with mild chronic pulmonary vascular congestion. 3. Mild chronic bronchitic changes.   Electronically Signed   By: Claudie Revering M.D.   On: 10/20/2014 15:08   Ct Head Wo Contrast  10/20/2014   CLINICAL DATA:  Patient with syncope while at church. No history of stroke or seizure.  EXAM: CT HEAD WITHOUT CONTRAST  TECHNIQUE: Contiguous axial images were obtained from the base of the skull through the vertex without intravenous contrast.  COMPARISON:  Brain MRI 03/18/2014 ; head CT 11/27/2002  FINDINGS:  Ventricles and sulci are appropriate for patient's age. Periventricular and subcortical white matter hypodensity compatible with chronic small vessel ischemic changes. Chronic left basal ganglia lacunar infarct. No evidence for acute cortically based infarct, intracranial hemorrhage, mass lesion or mass-effect. Orbits are unremarkable. Mucosal thickening within the left maxillary sinus. Mastoid air cells are well aerated. Calvarium is intact.  IMPRESSION: No acute intracranial process.  Chronic small vessel ischemic changes.   Electronically Signed   By: Lovey Newcomer M.D.   On: 10/20/2014 13:41     EKG Interpretation   Date/Time:  Sunday October 20 2014 12:18:43 EDT Ventricular Rate:  51 PR Interval:  182 QRS Duration: 158 QT Interval:  514 QTC Calculation: 473 R Axis:   -19 Text Interpretation:  Sinus bradycardia with occasional Premature  ventricular complexes and Possible Premature atrial complexes with  Abberant conduction Right bundle branch block Abnormal ECG PVCs today.  No  significant change since last tracing Confirmed by Mingo Amber  MD, [place]  ([id number]) on 10/20/2014 3:52:37 PM      MDM   Final diagnoses:  Blurry vision  Syncope    46M here with  syncopal event at church. Feeling well here. Was sitting down and passed out. Patient relaxing comfortably. Mild associated dizziness. No N/V, no SOB or CP. Had mild blurry vision initially, which is similar to some blurry vision he's had in the past intermittently for years.  Occasionally dipping HRs into the 40s. He is on a beta-blocker. Labs and Head CT ok. Admitted to medicine for his syncope. Labs and CT ok.    Evelina Bucy, MD 10/20/14 [phone]

## 2014-10-20 NOTE — Plan of Care (Signed)
Problem: Phase I Progression Outcomes Goal: Initial discharge plan identified Outcome: Completed/Met Date Met:  10/20/14 To return home alone

## 2014-10-20 NOTE — ED Notes (Signed)
Pt presents to department via [facility] for evaluation of syncope. Pt passed out while in church this morning. Pt doesn't remember exact events. Pt is alert and oriented x4 upon arrival. 20g R hand. CBG 158.

## 2014-10-20 NOTE — Progress Notes (Signed)
Received report from [place], Therapist, sports, will await for pt. To arrive to 5W 39.  Alphonzo Lemmings, RN

## 2014-10-20 NOTE — H&P (Signed)
[facility] History and Physical  Omarie Parcell DVV:[id number] DOB: [birth year] DOA: 10/20/2014  Referring physician: ED PCP: Precious Reel, MD  Specialists: None yet  Chief Complaint: Syncopal episode r/o CVA  HPI:    80 y/o ?, Prior UGIB-Sessile polpy foll Dr. Fuller Plan. Diverticulosis, s/p Vagotomy 1975, Htn, Chronic Sinus brady,  prostate cancer S/D therapy [last PET scan 2013 within normal limits] , fatty liver admitted from church 10/20/14 a.m.  I will see ? 5 MI in , - urinary - fecal incontinence  No confusion subsequent - Thought that at an was out maybe for about 5 minutes Speech was clear .  Has been doing well until the past couple of months . He has been told he has headaches as well as migraines in the remote past and thought that some of his symptomatology may be related to that.    Emergency room workup = potassium 3.4 BUN/creatinine 18/1.02 , glucose 140  Point of care troponin 0.00 , BNP 78  CT head chronic small vessel ischemic changes  2 view chest x-ray borderline cardiomegaly with mild chronic pulmonary vascular congestion and mild rhonchi   Review of Systems:  Minus secondary, - chest pain, - unilateral weakness, - prior episode like this, - dysuria, - rash, - cold, - sputum, - other issue   Past Medical History  Diagnosis Date  . Hypertension   . Ulcer     peptic  . Chronic kidney disease     nephrolithiasis right kidney  . Use of leuprolide acetate (Lupron) 06/2005-11/2006    02/2008 degarelix 02/2008 x 3 doses  . Prostate cancer 12/09/03 dx    Prostate ca/adenocarcinoma,gleason=3+3=6    pSA 4.6  . Gynecomastia 04/21/11    seen by Dr.Murray, no rad tx  . Hydroureteronephrosis     right   . Osteopenia   . Cancer 2006    L-2 solitary metastatic lesion/no rad tx ,asymptomatic  . Abnormal PSA 01/17/2012    10.38/4/5/ PSA:2013=6.96/ PSA 07/08/2011=6.26/ PSA 2006=8.6  . Blood transfusion without reported diagnosis     during vagotomy 1976   Past Surgical  History  Procedure Laterality Date  . Prostate biopsy  12/09/03    adenocarcinoma,glerason: 3+3=6  . Vagotomy  1978    partial   Social History:  History   Social History Narrative    No Known Allergies  Family History  Problem Relation Age of Onset  . Colon cancer Neg Hx     Prior to Admission medications   Medication Sig Start Date End Date Taking? Authorizing Provider  alendronate (FOSAMAX) 70 MG tablet Take 70 mg by mouth once a week. 10/01/14   Historical Provider, MD  Bisacodyl (DULCOLAX PO) Take by mouth. Dulcolax as directed for colonoscopy prep    Historical Provider, MD  Calcium-Vitamin D (CALTRATE 600 PLUS-VIT D PO) Take 1 tablet by mouth daily.     Historical Provider, MD  carvedilol (COREG) 12.5 MG tablet Take 12.5 mg by mouth 2 (two) times daily with a meal.    Historical Provider, MD  cholecalciferol (VITAMIN D) 1000 UNITS tablet Take 1,000 Units by mouth daily.    Historical Provider, MD  GRAPE SEED EXTRACT PO Take 2 capsules by mouth 2 (two) times daily.    Historical Provider, MD  hydrALAZINE (APRESOLINE) 25 MG tablet Take 25 mg by mouth 2 (two) times daily. 10/14/14   Historical Provider, MD  hydrochlorothiazide (HYDRODIURIL) 25 MG tablet Take 25 mg by mouth daily. 09/30/14   Historical Provider, MD  lisinopril (PRINIVIL,ZESTRIL) 40 MG tablet Take 40 mg by mouth 2 (two) times daily.     Historical Provider, MD  Magnesium 400 MG TABS Take by mouth daily.    Historical Provider, MD  saw palmetto 160 MG capsule Take 160 mg by mouth daily.    Historical Provider, MD  Selenium 200 MCG CAPS Take by mouth daily.    Historical Provider, MD  simvastatin (ZOCOR) 20 MG tablet Take 20 mg by mouth daily. 09/30/14   Historical Provider, MD  Turmeric Curcumin 500 MG CAPS Take by mouth daily.    Historical Provider, MD  [place] Name: BITTER APRICOT.  TAKES 500 MG TWICE DAILY    Historical Provider, MD  verapamil (CALAN-SR) 120 MG CR tablet Take 120 mg by mouth daily.  09/30/14   Historical Provider, MD  vitamin C (ASCORBIC ACID) 500 MG tablet Take 1,000 mg by mouth daily.     Historical Provider, MD  vitamin E 400 UNIT capsule Take 400 Units by mouth daily.    Historical Provider, MD  zinc gluconate 50 MG tablet Take 50 mg by mouth daily.    Historical Provider, MD   Physical Exam: Filed Vitals:   10/20/14 1232 10/20/14 1239 10/20/14 1422 10/20/14 1540  BP: 155/65  176/96 159/64  Pulse: 55  61 61  Temp: 98.7 F (37.1 C) 98.7 F (37.1 C)    TempSrc: Oral     Resp: 18  18 18   SpO2: 96%  97% 96%    EOMI NCAT-pleasant alert oriented Mallampati 2, mild submandibular lymphadenopathy, no thyromegaly Cannot appreciate JVD nor bruit S1-S2 no murmur rub or gallop Abdomen soft nontender nondistended no rebound no guarding Chest clinically clear no added sound No lower extremity edema Range of motion intact bilaterally Finger-nose-finger test normal, vision by direct confrontation normal, power 5/5, reflexes 2/3, gait not assessed, no pass pointing No pronator drift noted   Labs on Admission:  Basic Metabolic Panel:  Recent Labs Lab 10/20/14 1314  NA 139  K 3.4*  CL 100  CO2 28  GLUCOSE 140*  BUN 18  CREATININE 1.02  CALCIUM 9.1   Liver Function Tests: No results for input(s): AST, ALT, ALKPHOS, BILITOT, PROT, ALBUMIN in the last 168 hours. No results for input(s): LIPASE, AMYLASE in the last 168 hours. No results for input(s): AMMONIA in the last 168 hours. CBC:  Recent Labs Lab 10/20/14 1314  WBC 6.8  HGB 14.2  HCT 43.4  MCV 87.7  PLT 168   Cardiac Enzymes: No results for input(s): CKTOTAL, CKMB, CKMBINDEX, TROPONINI in the last 168 hours.  BNP (last 3 results)  Recent Labs  10/20/14 1314  BNP 78.1    ProBNP (last 3 results) No results for input(s): PROBNP in the last 8760 hours.  CBG: No results for input(s): GLUCAP in the last 168 hours.  Radiological Exams on Admission: Dg Chest 2 View  10/20/2014   CLINICAL  DATA:  Syncope.  History of hypertension.  EXAM: CHEST  2 VIEW  COMPARISON:  04/27/2007.  FINDINGS: Borderline enlarged cardiac silhouette with an interval decrease in size. Clear lungs. Mildly prominent pulmonary vasculature without significant change. Mild central peribronchial thickening. Mild thoracic spine degenerative changes. Upper abdominal surgical clips.  IMPRESSION: 1. No acute abnormality. 2. Borderline cardiomegaly with mild chronic pulmonary vascular congestion. 3. Mild chronic bronchitic changes.   Electronically Signed   By: Claudie Revering M.D.   On: 10/20/2014 15:08   Ct Head Wo Contrast  10/20/2014  CLINICAL DATA:  Patient with syncope while at church. No history of stroke or seizure.  EXAM: CT HEAD WITHOUT CONTRAST  TECHNIQUE: Contiguous axial images were obtained from the base of the skull through the vertex without intravenous contrast.  COMPARISON:  Brain MRI 03/18/2014 ; head CT 11/27/2002  FINDINGS: Ventricles and sulci are appropriate for patient's age. Periventricular and subcortical white matter hypodensity compatible with chronic small vessel ischemic changes. Chronic left basal ganglia lacunar infarct. No evidence for acute cortically based infarct, intracranial hemorrhage, mass lesion or mass-effect. Orbits are unremarkable. Mucosal thickening within the left maxillary sinus. Mastoid air cells are well aerated. Calvarium is intact.  IMPRESSION: No acute intracranial process.  Chronic small vessel ischemic changes.   Electronically Signed   By: Lovey Newcomer M.D.   On: 10/20/2014 13:41    EKG: Independently reviewed. Sinus rhythm sinus bradycardia rate 51 PVC noted pauses   Assessment/Plan  Syncope-etiology unclear but does not fit low risk category of [place] syncope rules-patient also has palpable pauses on pulse palpation. I have  discontinued HCTZ 25, lisinopril has been halved from 40-->20 , I have discontinued verapamil 120. Patient will need telemetry at least  overnight. Given his age and unclear in this of his prior functional state at church, I will get an MRI and if his MRI shows a stroke he will need a full workup including carotids as well as echocardiogram PTOT input. He is okay to eat from my standpoint  Active Problems:   Abnormal PSA outpatient follow-up with oncologist.-   Chronic kidney disease-at baseline   Hypertension-see above medication changes   History of upper GI bleed status post vagotomy 1978-continue PPI   Hyperlipidemia-hold Zocor for now    Time spent: 75 minutes  Full code Obs inpt    Rhetta Mura [facility] Pager [phone]  If 7PM-7AM, please contact night-coverage [email or web address] Password [facility] 10/20/2014, 4:08 PM

## 2014-10-20 NOTE — Progress Notes (Signed)
[name] [id number] Admitted to 8X09: 10/20/2014 5:08 PM Attending Provider: Nita Sells, MD    [name] is a 80 y.o. male patient admitted from ED awake, alert  & orientated  X 3,  Full Code, VSS - Blood pressure 162/83, pulse 61, temperature 97.8 F (36.6 C), temperature source Oral, resp. rate 16, height 6\' 1"  (1.854 m), weight 107.366 kg (236 lb 11.2 oz), SpO2 95 %., R/A, no c/o shortness of breath, no c/o chest pain, no distress noted. Tele # 19 placed and pt is currently running:sinus bradycardia.   IV site WDL:  hand right, condition patent and no redness with a transparent dsg that's clean dry and intact.  Allergies:  No Known Allergies   Past Medical History  Diagnosis Date  . Hypertension   . Ulcer     peptic  . Chronic kidney disease     nephrolithiasis right kidney  . Use of leuprolide acetate (Lupron) 06/2005-11/2006    02/2008 degarelix 02/2008 x 3 doses  . Prostate cancer 12/09/03 dx    Prostate ca/adenocarcinoma,gleason=3+3=6    pSA 4.6  . Gynecomastia 04/21/11    seen by Dr.Murray, no rad tx  . Hydroureteronephrosis     right   . Osteopenia   . Cancer 2006    L-2 solitary metastatic lesion/no rad tx ,asymptomatic  . Abnormal PSA 01/17/2012    10.38/4/5/ PSA:2013=6.96/ PSA 07/08/2011=6.26/ PSA 2006=8.6  . Blood transfusion without reported diagnosis     during vagotomy 1976  . Sleep apnea     History: attempted to obtained from the patient, before he went to MRI.  Pt orientation to unit, room and routine. Information packet given to patient/family and safety video watched.  Admission INP armband ID verified with patient/family, and in place. SR up x 2, fall risk assessment complete with Patient and family verbalizing understanding of risks associated with falls. Pt verbalizes an understanding of how to use the call bell and to call for help before getting out of bed.  Skin, clean-dry- intact without evidence of bruising, or skin tears.   No evidence of  skin break down noted on exam.    Will cont to monitor and assist as needed.  Lindalou Hose, RN 10/20/2014 5:08 PM

## 2014-10-20 NOTE — ED Notes (Signed)
Admitting MD at bedside.

## 2014-10-21 ENCOUNTER — Encounter: Payer: Self-pay | Admitting: Vascular Surgery

## 2014-10-21 DIAGNOSIS — I10 Essential (primary) hypertension: Secondary | ICD-10-CM | POA: Diagnosis not present

## 2014-10-21 DIAGNOSIS — R55 Syncope and collapse: Secondary | ICD-10-CM | POA: Diagnosis not present

## 2014-10-21 DIAGNOSIS — R001 Bradycardia, unspecified: Secondary | ICD-10-CM | POA: Diagnosis not present

## 2014-10-21 LAB — LIPID PANEL
CHOL/HDL RATIO: 4.2 ratio
Cholesterol: 146 mg/dL (ref 0–200)
HDL: 35 mg/dL — ABNORMAL LOW (ref >39)
LDL Cholesterol: 70 mg/dL (ref 0–99)
Triglycerides: 203 mg/dL — ABNORMAL HIGH (ref <150)
VLDL: 41 mg/dL — ABNORMAL HIGH (ref 0–40)

## 2014-10-21 LAB — COMPREHENSIVE METABOLIC PANEL
ALBUMIN: 3.5 g/dL (ref 3.5–5.2)
ALK PHOS: 48 U/L (ref 39–117)
ALT: 29 U/L (ref 0–53)
ANION GAP: 13 (ref 5–15)
AST: 26 U/L (ref 0–37)
BILIRUBIN TOTAL: 1 mg/dL (ref 0.3–1.2)
BUN: 14 mg/dL (ref 6–23)
CO2: 25 mmol/L (ref 19–32)
Calcium: 8.7 mg/dL (ref 8.4–10.5)
Chloride: 102 mmol/L (ref 96–112)
Creatinine, Ser: 1 mg/dL (ref 0.50–1.35)
GFR calc Af Amer: 82 mL/min — ABNORMAL LOW (ref >90)
GFR calc non Af Amer: 70 mL/min — ABNORMAL LOW (ref >90)
Glucose, Bld: 134 mg/dL — ABNORMAL HIGH (ref 70–99)
POTASSIUM: 3.4 mmol/L — AB (ref 3.5–5.1)
Sodium: 140 mmol/L (ref 135–145)
TOTAL PROTEIN: 7 g/dL (ref 6.0–8.3)

## 2014-10-21 LAB — TROPONIN I
Troponin I: <0.03 ng/mL (ref <0.031)
Troponin I: <0.03 ng/mL (ref <0.031)
Troponin I: <0.03 ng/mL (ref <0.031)

## 2014-10-21 LAB — GLUCOSE, CAPILLARY: GLUCOSE-CAPILLARY: 132 mg/dL — AB (ref 70–99)

## 2014-10-21 MED ORDER — AMLODIPINE BESYLATE 5 MG PO TABS
5.0000 mg | ORAL_TABLET | Freq: Every day | ORAL | Status: DC
  Administered 2014-10-21 – 2014-10-22 (×2): 5 mg via ORAL
  Filled 2014-10-21 (×3): qty 1

## 2014-10-21 MED ORDER — POTASSIUM CHLORIDE CRYS ER 20 MEQ PO TBCR
40.0000 meq | EXTENDED_RELEASE_TABLET | Freq: Two times a day (BID) | ORAL | Status: AC
  Administered 2014-10-21 (×2): 40 meq via ORAL
  Filled 2014-10-21 (×2): qty 2

## 2014-10-21 MED ORDER — ASPIRIN 81 MG PO CHEW
81.0000 mg | CHEWABLE_TABLET | Freq: Every day | ORAL | Status: DC
  Administered 2014-10-21: 81 mg via ORAL
  Filled 2014-10-21 (×2): qty 1

## 2014-10-21 MED ORDER — CARVEDILOL 6.25 MG PO TABS
6.2500 mg | ORAL_TABLET | Freq: Two times a day (BID) | ORAL | Status: DC
  Administered 2014-10-21 – 2014-10-22 (×3): 6.25 mg via ORAL
  Filled 2014-10-21 (×4): qty 1

## 2014-10-21 MED ORDER — SIMVASTATIN 20 MG PO TABS
20.0000 mg | ORAL_TABLET | Freq: Every day | ORAL | Status: DC
  Administered 2014-10-21 – 2014-10-22 (×2): 20 mg via ORAL
  Filled 2014-10-21 (×2): qty 1

## 2014-10-21 NOTE — Care Management Note (Unsigned)
    Page 1 of 1   10/21/2014     3:15:47 PM CARE MANAGEMENT NOTE 10/21/2014  Patient:  [name],[name]   Account Number:  [id number]  Date Initiated:  10/21/2014  Documentation initiated by:  Tomi Bamberger  Subjective/Objective Assessment:   dx syncope  admit- home alone.     Action/Plan:   pt eval- no pt f/u   Anticipated DC Date:  10/21/2014   Anticipated DC Plan:  HOME/SELF CARE         Choice offered to / List presented to:             Status of service:  In process, will continue to follow Medicare Important Message given?   (If response is "NO", the following Medicare IM given date fields will be blank) Date Medicare IM given:   Medicare IM given by:   Date Additional Medicare IM given:   Additional Medicare IM given by:    Discharge Disposition:    Per UR Regulation:    If discussed at Long Length of Stay Meetings, dates discussed:    Comments:  10/21/14 [place] RN BSN [phone] patient is from home alone, for dopplers, per pt eval- no pt f/u needed.

## 2014-10-21 NOTE — Progress Notes (Signed)
UR completed 

## 2014-10-21 NOTE — Progress Notes (Signed)
[facility] PROGRESS NOTE  [name] TOI:[id number] DOB: 05-02-1935 DOA: 10/20/2014 PCP: Precious Reel, MD  Assessment/Plan: 1-Syncope; Patient with bradycardia, RBBB. Cycle cardiac enzymes, ECHO. Hold coreg, and verapamil. Cardiology consulted for further evaluation. Does patient needs Stress test.   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  check Hb-A1c, lipid panel, ECHO, carotid doppler.  -Will start Aspirin.   3-HTN; hold verapamil, coreg in setting of bradycardia. Continue with hydralazine, lisinopril. Hold HCTZ.   4-Bradycardia; hold verapamil and coreg. Replete K.   5-Chronic kidney disease-at baseline  6-Hyperlipidemia- Resume  Zocor. Check lipid panel.    Code Status: Full code.  Family Communication: care discussed with patient.  Disposition Plan: evaluation for syncpe and old stroke.    Consultants:  Cardiology  Procedures:  ECHO.   Doppler.   Antibiotics:  none  HPI/Subjective: Denies chest pain or dyspnea. He was at church yesterday when he felt clammy , diaphoresis and subsequently pass out.  No new medications changes, denies palpitation or chest pain prior to episode.   Objective: Filed Vitals:   10/21/14 0802  BP: 153/80  Pulse: 49  Temp: 98.2 F (36.8 C)  Resp: 14    Intake/Output Summary (Last 24 hours) at 10/21/14 0840 Last data filed at 10/21/14 0639  Gross per 24 hour  Intake      3 ml  Output    575 ml  Net   -572 ml   Filed Weights   10/20/14 1646 10/21/14 0431  Weight: 107.366 kg (236 lb 11.2 oz) 107.049 kg (236 lb)    Exam:   General:  Alert in no distress.   Cardiovascular: S 1, S 2 RRR  Respiratory: CTA  Abdomen: BS present. Soft, nt  Musculoskeletal: no edema.   Data Reviewed: Basic Metabolic Panel:  Recent Labs Lab 10/20/14 1314 10/21/14 0650  NA 139 140  K 3.4* 3.4*  CL 100 102  CO2 28 25  GLUCOSE 140* 134*  BUN 18 14  CREATININE 1.02 1.00  CALCIUM 9.1 8.7    Liver Function Tests:  Recent Labs Lab 10/21/14 0650  AST 26  ALT 29  ALKPHOS 48  BILITOT 1.0  PROT 7.0  ALBUMIN 3.5   No results for input(s): LIPASE, AMYLASE in the last 168 hours. No results for input(s): AMMONIA in the last 168 hours. CBC:  Recent Labs Lab 10/20/14 1314  WBC 6.8  HGB 14.2  HCT 43.4  MCV 87.7  PLT 168   Cardiac Enzymes: No results for input(s): CKTOTAL, CKMB, CKMBINDEX, TROPONINI in the last 168 hours. BNP (last 3 results)  Recent Labs  10/20/14 1314  BNP 78.1    ProBNP (last 3 results) No results for input(s): PROBNP in the last 8760 hours.  CBG:  Recent Labs Lab 10/21/14 0740  GLUCAP 132*    No results found for this or any previous visit (from the past 240 hour(s)).   Studies: Dg Chest 2 View  10/20/2014   CLINICAL DATA:  Syncope.  History of hypertension.  EXAM: CHEST  2 VIEW  COMPARISON:  04/27/2007.  FINDINGS: Borderline enlarged cardiac silhouette with an interval decrease in size. Clear lungs. Mildly prominent pulmonary vasculature without significant change. Mild central peribronchial thickening. Mild thoracic spine degenerative changes. Upper abdominal surgical clips.  IMPRESSION: 1. No acute abnormality. 2. Borderline cardiomegaly with mild chronic pulmonary vascular congestion. 3. Mild chronic bronchitic changes.   Electronically Signed   By: Claudie Revering M.D.   On: 10/20/2014 15:08  Ct Head Wo Contrast  10/20/2014   CLINICAL DATA:  Patient with syncope while at church. No history of stroke or seizure.  EXAM: CT HEAD WITHOUT CONTRAST  TECHNIQUE: Contiguous axial images were obtained from the base of the skull through the vertex without intravenous contrast.  COMPARISON:  Brain MRI 03/18/2014 ; head CT 11/27/2002  FINDINGS: Ventricles and sulci are appropriate for patient's age. Periventricular and subcortical white matter hypodensity compatible with chronic small vessel ischemic changes. Chronic left basal ganglia lacunar  infarct. No evidence for acute cortically based infarct, intracranial hemorrhage, mass lesion or mass-effect. Orbits are unremarkable. Mucosal thickening within the left maxillary sinus. Mastoid air cells are well aerated. Calvarium is intact.  IMPRESSION: No acute intracranial process.  Chronic small vessel ischemic changes.   Electronically Signed   By: Lovey Newcomer M.D.   On: 10/20/2014 13:41   Mr Brain Wo Contrast  10/20/2014   CLINICAL DATA:  Syncopal episode at church this morning.  EXAM: MRI HEAD WITHOUT CONTRAST  TECHNIQUE: Multiplanar, multiecho pulse sequences of the brain and surrounding structures were obtained without intravenous contrast.  COMPARISON:  CT head without contrast 10/20/2014. MRI brain 03/18/2014  FINDINGS: No acute infarct, hemorrhage, or mass lesion is present. Remote lacunar infarcts of the pons are stable. There are remote lacunar infarcts in dilated perivascular spaces within the basal ganglia bilaterally. Periventricular and subcortical T2 changes bilaterally are similar to the prior study.  No acute infarct, hemorrhage, or mass lesion is present. No significant extraaxial fluid collection is present. The ventricles are proportionate to the degree of atrophy.  Abnormal signal is present in the left vertebral artery suggesting and slow or occluded flow. Flow is otherwise present in the major intracranial arteries.  The globes and orbits are intact. The paranasal sinuses and mastoid air cells are clear.  Skullbase is within normal limits. Midline structures are unremarkable.  IMPRESSION: 1. No acute intracranial abnormality. 2. Remote lacunar infarcts of the brainstem and basal ganglia bilaterally are similar to the prior exam. 3. Advanced atrophy and white matter disease likely reflects the sequela of chronic microvascular ischemia.   Electronically Signed   By: [place] M.D.   On: 10/20/2014 17:58    Scheduled Meds: . heparin  5,000 Units Subcutaneous 3 times per  day  . hydrALAZINE  25 mg Oral BID  . lisinopril  20 mg Oral BID  . pantoprazole  40 mg Oral Daily  . potassium chloride  40 mEq Oral BID  . sodium chloride  3 mL Intravenous Q12H   Continuous Infusions:   Principal Problem:   Syncope Active Problems:   Abnormal PSA   Chronic kidney disease   Hypertension    Time spent: 35 minutes.     Niel Hummer A  [facility] Pager [phone]. If 7PM-7AM, please contact night-coverage at [email or web address], password [facility] 10/21/2014, 8:40 AM

## 2014-10-21 NOTE — Evaluation (Signed)
Physical Therapy Evaluation Patient Details Name: [name] MRN: [id number] DOB: January 07, 1935 Today's Date: 10/21/2014   History of Present Illness  Pt is a 80 y./o male admitted after syncopal episode.  Work up continues.  PMHx: prostate CA, HTN  Clinical Impression  Pt is at baseline functioning and at Independent level for the community.  Discussed with pt risk factors of stroke and suggested trying to improve 1 of the risk factors.  No further PT needs.  Will sign off.    Follow Up Recommendations No PT follow up    Equipment Recommendations  None recommended by PT    Recommendations for Other Services       Precautions / Restrictions Precautions Precautions: None      Mobility  Bed Mobility Overal bed mobility: Independent                Transfers Overall transfer level: Independent                  Ambulation/Gait Ambulation/Gait assistance: Independent   Assistive device: None Gait Pattern/deviations: WFL(Within Functional Limits) Gait velocity: fast Gait velocity interpretation: at or above normal speed for age/gender General Gait Details: safe and fluid gait  Stairs Stairs: Yes Stairs assistance: Independent Stair Management: No rails Number of Stairs: 10 General stair comments: safe and fluid  Wheelchair Mobility    Modified Rankin (Stroke Patients Only)       Balance Overall balance assessment: Independent                               Standardized Balance Assessment Standardized Balance Assessment : Dynamic Gait Index   Dynamic Gait Index Level Surface: Normal Change in Gait Speed: Normal Gait with Horizontal Head Turns: Normal Gait with Vertical Head Turns: Normal Gait and Pivot Turn: Normal Step Over Obstacle: Normal Step Around Obstacles: Normal Steps: Normal Total Score: 24       Pertinent Vitals/Pain Pain Assessment: No/denies pain    Home Living Family/patient expects to be discharged to::  Private residence Living Arrangements: Alone Available Help at Discharge: Family;Available PRN/intermittently Type of Home: House Home Access: Stairs to enter Entrance Stairs-Rails: Psychiatric nurse of Steps: several Home Layout: Multi-level Home Equipment: None      Prior Function Level of Independence: Independent               Hand Dominance        Extremity/Trunk Assessment   Upper Extremity Assessment: Overall WFL for tasks assessed           Lower Extremity Assessment: Overall WFL for tasks assessed      Cervical / Trunk Assessment: Normal  Communication   Communication: No difficulties  Cognition Arousal/Alertness: Awake/alert Behavior During Therapy: WFL for tasks assessed/performed Overall Cognitive Status: Within Functional Limits for tasks assessed                      General Comments      Exercises        Assessment/Plan    PT Assessment Patent does not need any further PT services  PT Diagnosis     PT Problem List    PT Treatment Interventions     PT Goals (Current goals can be found in the Care Plan section) Acute Rehab PT Goals PT Goal Formulation: All assessment and education complete, DC therapy    Frequency     Barriers to discharge  Co-evaluation               End of Session   Activity Tolerance: Patient tolerated treatment well Patient left: in bed Nurse Communication: Mobility status    Functional Assessment Tool Used: clinical judgement Functional Limitation: Mobility: Walking and moving around Mobility: Walking and Moving Around Current Status [phone]): 0 percent impaired, limited or restricted Mobility: Walking and Moving Around Goal Status [phone]): 0 percent impaired, limited or restricted Mobility: Walking and Moving Around Discharge Status [phone]): 0 percent impaired, limited or restricted    Time: 1005-1024 PT Time Calculation (min) (ACUTE ONLY): 19 min   Charges:    PT Evaluation $Initial PT Evaluation Tier I: 1 Procedure     PT G Codes:   PT G-Codes **NOT FOR INPATIENT CLASS** Functional Assessment Tool Used: clinical judgement Functional Limitation: Mobility: Walking and moving around Mobility: Walking and Moving Around Current Status ([id number]): 0 percent impaired, limited or restricted Mobility: Walking and Moving Around Goal Status ([id number]): 0 percent impaired, limited or restricted Mobility: Walking and Moving Around Discharge Status ([id number]): 0 percent impaired, limited or restricted    [place], Tessie Fass 10/21/2014, 10:32 AM 10/21/2014  Donnella Sham, PT [phone]  (pager)

## 2014-10-21 NOTE — Consult Note (Addendum)
CARDIOLOGY CONSULT NOTE  Patient ID: [name], MRN: [id number], DOB/AGE: 1934/10/05 80 y.o. 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 The patient was in his usual state of health. He had a normal breakfast before church. While sitting in church, he developed blurry vision and diaphoresis. He then laid down on the pew and lost consciousness. He thinks he was only out for a brief period. He awoke with a church member standing over him. He did not have chest pain, shortness of breath, or loss of bowel/bladder function. He was brought to the emergency department and admitted for observation of syncope. He has been noted to have bradycardia on telemetry overnight.  The patient has a long-standing history of episodic blurry vision. Symptoms last for just a few minutes. He has attributed this to migraine headaches. He thought his episode yesterday was similar to these chronic episodes. However, he has not had frank syncope in the past. The patient is a long time hypertensive. He takes multiple medications but states that his blood pressure is usually not under very good control. Notes that carvedilol and verapamil were both held on admission. The patient feels back to his normal state of health.  Medical History:  Past Medical History  Diagnosis Date  . Hypertension   . Ulcer     peptic  . Chronic kidney disease     nephrolithiasis right kidney  . Use of leuprolide acetate (Lupron) 06/2005-11/2006    02/2008 degarelix 02/2008 x 3 doses  . Prostate cancer 12/09/03 dx    Prostate ca/adenocarcinoma,gleason=3+3=6    pSA 4.6  . Gynecomastia 04/21/11    seen by Dr.Murray, no rad tx  . Hydroureteronephrosis     right   . Osteopenia   . Cancer 2006    L-2  solitary metastatic lesion/no rad tx ,asymptomatic  . Abnormal PSA 01/17/2012    10.38/4/5/ PSA:2013=6.96/ PSA 07/08/2011=6.26/ PSA 2006=8.6  . Blood transfusion without reported diagnosis     during vagotomy 1976  . Sleep apnea       Surgical History:  Past Surgical History  Procedure Laterality Date  . Prostate biopsy  12/09/03    adenocarcinoma,glerason: 3+3=6  . Vagotomy  1978    partial     Home Meds: Prior to Admission medications   Medication Sig Start Date End Date Taking? Authorizing Provider  alendronate (FOSAMAX) 70 MG tablet Take 70 mg by mouth once a week. 10/01/14  Yes Historical Provider, MD  Calcium-Vitamin D (CALTRATE 600 PLUS-VIT D PO) Take 1 tablet by mouth daily.    Yes Historical Provider, MD  carvedilol (COREG) 12.5 MG tablet Take 12.5 mg by mouth 2 (two) times daily with a meal.   Yes Historical Provider, MD  cholecalciferol (VITAMIN D) 1000 UNITS tablet Take 1,000 Units by mouth daily.   Yes Historical Provider, MD  GRAPE SEED EXTRACT PO Take 2 capsules by mouth 2 (two) times daily.   Yes Historical Provider, MD  hydrALAZINE (APRESOLINE) 25 MG tablet Take 25 mg by mouth 2 (two) times daily. 10/14/14  Yes Historical Provider, MD  hydrochlorothiazide (HYDRODIURIL) 25 MG tablet Take 25 mg by mouth daily. 09/30/14  Yes Historical Provider, MD  lisinopril (PRINIVIL,ZESTRIL) 40 MG tablet Take 40 mg by mouth 2 (two) times daily.    Yes Historical Provider, MD  Magnesium  400 MG TABS Take by mouth daily.   Yes Historical Provider, MD  saw palmetto 160 MG capsule Take 160 mg by mouth daily.   Yes Historical Provider, MD  Selenium 200 MCG CAPS Take by mouth daily.   Yes Historical Provider, MD  simvastatin (ZOCOR) 20 MG tablet Take 20 mg by mouth daily. 09/30/14  Yes Historical Provider, MD  Turmeric Curcumin 500 MG CAPS Take by mouth daily.   Yes Historical Provider, MD  UNABLE TO FIND Med Name: BITTER APRICOT.  TAKES 500 MG TWICE DAILY   Yes Historical Provider, MD  verapamil  (CALAN-SR) 120 MG CR tablet Take 120 mg by mouth daily. 09/30/14  Yes Historical Provider, MD  vitamin C (ASCORBIC ACID) 500 MG tablet Take 1,000 mg by mouth daily.    Yes Historical Provider, MD  vitamin E 400 UNIT capsule Take 400 Units by mouth daily.   Yes Historical Provider, MD  zinc gluconate 50 MG tablet Take 50 mg by mouth daily.   Yes Historical Provider, MD    Inpatient Medications:  . aspirin  81 mg Oral Daily  . heparin  5,000 Units Subcutaneous 3 times per day  . hydrALAZINE  25 mg Oral BID  . lisinopril  20 mg Oral BID  . pantoprazole  40 mg Oral Daily  . potassium chloride  40 mEq Oral BID  . simvastatin  20 mg Oral q1800  . sodium chloride  3 mL Intravenous Q12H      Allergies: No Known Allergies  History   Social History  . Marital Status: Married    Spouse Name: N/A  . Number of Children: 2  . 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 Smoking status: Former Smoker    Types: Pipe    Quit date: 07/05/1984  . Smokeless tobacco: Never Used  . Alcohol Use: No  . Drug Use: No  . Sexual Activity: No   Other Topics Concern  . Not on file   Social History Narrative     Family History  Problem Relation Age of Onset  . Colon cancer Neg Hx      Review of Systems: General: negative for chills, fever, night sweats or weight changes.  ENT: negative for rhinorrhea or epistaxis Cardiovascular: see HPI Dermatological: negative for rash Respiratory: negative for cough or wheezing GI: negative for nausea, vomiting, diarrhea, bright red blood per rectum, melena, or hematemesis GU: no hematuria, urgency, or frequency Neurologic: positive for visual changes (blurry vision), positive for headache (hx migraines) Heme: no easy bruising or bleeding Endo: negative for excessive thirst, thyroid disorder, or flushing Musculoskeletal: negative for joint pain or swelling, negative for myalgias  All other  systems reviewed and are otherwise negative except as noted above.  Physical Exam: Blood pressure 153/80, pulse 49, temperature 98.2 F (36.8 C), temperature source Oral, resp. rate 14, height 6\' 1"  (1.854 m), weight 236 lb (107.049 kg), SpO2 92 %. Pt is alert and oriented, WD, WN, in no distress. HEENT: normal Neck: JVP normal. Carotid upstrokes normal without bruits. No thyromegaly. Lungs: equal expansion, clear bilaterally CV: Apex is discrete and nondisplaced, RRR without murmur or gallop Abd: soft, NT, +BS, no bruit, no hepatosplenomegaly Back: no CVA tenderness Ext: no C/C/E        DP/PT pulses intact and = Skin: warm and dry without rash Neuro: CNII-XII intact  Strength intact = bilaterally    Labs: No results for input(s): CKTOTAL, CKMB, TROPONINI in the last 72 hours. Lab Results  Component Value Date   WBC 6.8 10/20/2014   HGB 14.2 10/20/2014   HCT 43.4 10/20/2014   MCV 87.7 10/20/2014   PLT 168 10/20/2014    Recent Labs Lab 10/21/14 0650  NA 140  K 3.4*  CL 102  CO2 25  BUN 14  CREATININE 1.00  CALCIUM 8.7  PROT 7.0  BILITOT 1.0  ALKPHOS 48  ALT 29  AST 26  GLUCOSE 134*   No results found for: CHOL, HDL, LDLCALC, TRIG No results found for: DDIMER  Radiology/Studies:  Dg Chest 2 View  10/20/2014   CLINICAL DATA:  Syncope.  History of hypertension.  EXAM: CHEST  2 VIEW  COMPARISON:  04/27/2007.  FINDINGS: Borderline enlarged cardiac silhouette with an interval decrease in size. Clear lungs. Mildly prominent pulmonary vasculature without significant change. Mild central peribronchial thickening. Mild thoracic spine degenerative changes. Upper abdominal surgical clips.  IMPRESSION: 1. No acute abnormality. 2. Borderline cardiomegaly with mild chronic pulmonary vascular congestion. 3. Mild chronic bronchitic changes.   Electronically Signed   By: Claudie Revering M.D.   On: 10/20/2014 15:08   Ct Head Wo Contrast  10/20/2014   CLINICAL DATA:  Patient  with syncope while at church. No history of stroke or seizure.  EXAM: CT HEAD WITHOUT CONTRAST  TECHNIQUE: Contiguous axial images were obtained from the base of the skull through the vertex without intravenous contrast.  COMPARISON:  Brain MRI 03/18/2014 ; head CT 11/27/2002  FINDINGS: Ventricles and sulci are appropriate for patient's age. Periventricular and subcortical white matter hypodensity compatible with chronic small vessel ischemic changes. Chronic left basal ganglia lacunar infarct. No evidence for acute cortically based infarct, intracranial hemorrhage, mass lesion or mass-effect. Orbits are unremarkable. Mucosal thickening within the left maxillary sinus. Mastoid air cells are well aerated. Calvarium is intact.  IMPRESSION: No acute intracranial process.  Chronic small vessel ischemic changes.   Electronically Signed   By: Lovey Newcomer M.D.   On: 10/20/2014 13:41   Mr Brain Wo Contrast  10/20/2014   CLINICAL DATA:  Syncopal episode at church this morning.  EXAM: MRI HEAD WITHOUT CONTRAST  TECHNIQUE: Multiplanar, multiecho pulse sequences of the brain and surrounding structures were obtained without intravenous contrast.  COMPARISON:  CT head without contrast 10/20/2014. MRI brain 03/18/2014  FINDINGS: No acute infarct, hemorrhage, or mass lesion is present. Remote lacunar infarcts of the pons are stable. There are remote lacunar infarcts in dilated perivascular spaces within the basal ganglia bilaterally. Periventricular and subcortical T2 changes bilaterally are similar to the prior study.  No acute infarct, hemorrhage, or mass lesion is present. No significant extraaxial fluid collection is present. The ventricles are proportionate to the degree of atrophy.  Abnormal signal is present in the left vertebral artery suggesting and slow or occluded flow. Flow is otherwise present in the major intracranial arteries.  The globes and orbits are intact. The paranasal sinuses and mastoid air cells are  clear.  Skullbase is within normal limits. Midline structures are unremarkable.  IMPRESSION: 1. No acute intracranial abnormality. 2. Remote lacunar infarcts of the brainstem and basal ganglia bilaterally are similar to the prior exam. 3. Advanced atrophy and white matter disease likely reflects the sequela of chronic microvascular ischemia.   Electronically Signed   By: San Morelle M.D.   On: 10/20/2014 17:58    EKG: Sinus brady, RBBB,  PVC's  Cardiac Studies: 2D Echo Pending  Telemetry: Sinus brady/sinus rhythm, single PVC's, one ventricular couplet  ASSESSMENT AND PLAN:  80 year old gentleman with syncope. Careful history is suggestive of a neuro depressor event. Other considerations include symptomatic bradycardia or other arrhythmogenic causes of syncope. Recommend the following:   2d Echo  Stop verapamil and change to amlodipine 5 mg daily to avoid further bradycardia  Continue carvedilol (at half of home dose - new dose 6.25 mg BID) and other antihypertensive medications   Push fluids  No indication for ischemic workup unless echo shows LV dysfunction  Outpatient event monitor - I will arrange  Further workup for MRI findings of lacunar strokes as per [facility] team. Suspect findings related to hypertensive disease. He has no clinical hx of stroke.   Signed, Sherren Mocha MD, [facility] 10/21/2014, 9:54 AM

## 2014-10-22 ENCOUNTER — Other Ambulatory Visit: Payer: Self-pay | Admitting: Physician Assistant

## 2014-10-22 ENCOUNTER — Encounter: Payer: Self-pay | Admitting: Vascular Surgery

## 2014-10-22 DIAGNOSIS — I493 Ventricular premature depolarization: Secondary | ICD-10-CM | POA: Diagnosis present

## 2014-10-22 DIAGNOSIS — R55 Syncope and collapse: Secondary | ICD-10-CM

## 2014-10-22 DIAGNOSIS — I4510 Unspecified right bundle-branch block: Secondary | ICD-10-CM | POA: Diagnosis not present

## 2014-10-22 DIAGNOSIS — R001 Bradycardia, unspecified: Secondary | ICD-10-CM | POA: Diagnosis not present

## 2014-10-22 DIAGNOSIS — I129 Hypertensive chronic kidney disease with stage 1 through stage 4 chronic kidney disease, or unspecified chronic kidney disease: Secondary | ICD-10-CM | POA: Diagnosis not present

## 2014-10-22 DIAGNOSIS — I10 Essential (primary) hypertension: Secondary | ICD-10-CM | POA: Diagnosis not present

## 2014-10-22 LAB — HEMOGLOBIN A1C
Hgb A1c MFr Bld: 7.1 % — ABNORMAL HIGH (ref 4.8–5.6)
Mean Plasma Glucose: 157 mg/dL

## 2014-10-22 LAB — GLUCOSE, CAPILLARY: Glucose-Capillary: 119 mg/dL — ABNORMAL HIGH (ref 70–99)

## 2014-10-22 MED ORDER — ASPIRIN 81 MG PO CHEW: 81.0000 mg | CHEWABLE_TABLET | Freq: Every day | ORAL | Status: DC

## 2014-10-22 MED ORDER — LISINOPRIL 20 MG PO TABS: 20.0000 mg | ORAL_TABLET | Freq: Two times a day (BID) | ORAL | Status: DC

## 2014-10-22 MED ORDER — AMLODIPINE BESYLATE 5 MG PO TABS: 5.0000 mg | ORAL_TABLET | Freq: Every day | ORAL | Status: DC

## 2014-10-22 MED ORDER — CARVEDILOL 6.25 MG PO TABS: 6.2500 mg | ORAL_TABLET | Freq: Two times a day (BID) | ORAL | Status: DC

## 2014-10-22 MED ORDER — METFORMIN HCL 500 MG PO TABS: 500.0000 mg | ORAL_TABLET | Freq: Two times a day (BID) | ORAL | Status: DC

## 2014-10-22 NOTE — Progress Notes (Signed)
Bilateral carotid artery duplex completed:  1-39% ICA stenosis.  Vertebral artery flow is antegrade.     

## 2014-10-22 NOTE — Discharge Summary (Signed)
Physician Discharge Summary  [name] WER:[id number] DOB: [birth year] DOA: 10/20/2014  PCP: Precious Reel, MD  Admit date: 10/20/2014 Discharge date: 10/22/2014  Time spent: 35 minutes  Recommendations for Outpatient Follow-up:  1. Needs follow up with cardio 2. Increase metformin if patient is tolerating current dose  3. Needs follow up with cardio for further evaluation of syncope.    Discharge Diagnoses:  Principal Problem:   Syncope and collapse Active Problems:   Hx of Prostate cancer   Hypertension   Old lacunar strokes by MRI   RBBB   Bradycardia   PVC's (premature ventricular contractions)   Discharge Condition: Stable.   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 The patient was in his usual state of health. He had a normal breakfast before church. While sitting in church, he developed blurry vision and diaphoresis. He then laid down on the pew and lost consciousness. He thinks he was only out for a brief period. He awoke with a church member standing over him. He did not have chest pain, shortness of breath, or loss of bowel/bladder function. He was brought to the emergency department and admitted for observation of syncope. He has been noted to have bradycardia on telemetry overnight.  Hospital Course:  1-Syncope; Patient with bradycardia, RBBB. cardiac enzymes negative, ECHO normal EF. Marland Kitchen Hold coreg, and verapamil. Cardiology consulted for further evaluation.  -might be related to vasovagal or hypotension/  -no further evaluation per cardio.   2-Old Lacunar stroke, finding on MRI; no prior stroke work up.Hb-A1c at 7. , lipid panel ldl at 70, ECHO no evidence of clot, carotid doppler no significant stenosis. .  -continue with Aspirin.  -on  statin.  3-HTN; hold verapamil, in setting  of bradycardia. Continue with hydralazine, lisinopril. Hold HCTZ.   4-Bradycardia; hold verapamil and  Coreg dose was decrease.    5-Chronic kidney disease-at baseline  6-Hyperlipidemia- Resume Zocor. lipid panel. LDL at 70.  7-new diagnosis of DM; hba1c at 7. Started on metformin.  Procedures:  Normal EF>   Consultations:  Cardiology  Discharge Exam: Filed Vitals:   10/22/14 1335  BP: 158/81  Pulse: 51  Temp: 97.6 F (36.4 C)  Resp: 18    General: Alert in no distress.  Cardiovascular: s 1, S 2 RRR Respiratory: CTA  Discharge Instructions   Discharge Instructions    Diet - low sodium heart healthy    Complete by:  As directed      Increase activity slowly    Complete by:  As directed           Current Discharge Medication List    START taking these medications   Details  amLODipine (NORVASC) 5 MG tablet Take 1 tablet (5 mg total) by mouth daily. Qty: 30 tablet, Refills: 0    aspirin 81 MG chewable tablet Chew 1 tablet (81 mg total) by mouth daily. Qty: 30 tablet, Refills: 0    metFORMIN (GLUCOPHAGE) 500 MG tablet Take 1 tablet (500 mg total) by mouth 2 (two) times daily with a meal. Qty: 60 tablet, Refills: 0      CONTINUE these medications which have CHANGED   Details  carvedilol (COREG) 6.25 MG tablet Take 1 tablet (6.25 mg total) by mouth 2 (two) times daily with  a meal. Qty: 60 tablet, Refills: 0    lisinopril (PRINIVIL,ZESTRIL) 20 MG tablet Take 1 tablet (20 mg total) by mouth 2 (two) times daily. Qty: 60 tablet, Refills: 0      CONTINUE these medications which have NOT CHANGED   Details  alendronate (FOSAMAX) 70 MG tablet Take 70 mg by mouth once a week. Refills: 1    Calcium-Vitamin D (CALTRATE 600 PLUS-VIT D PO) Take 1 tablet by mouth daily.     cholecalciferol (VITAMIN D) 1000 UNITS tablet Take 1,000 Units by mouth daily.    GRAPE SEED EXTRACT PO Take 2 capsules by mouth 2 (two) times  daily.    hydrALAZINE (APRESOLINE) 25 MG tablet Take 25 mg by mouth 2 (two) times daily. Refills: 1    Magnesium 400 MG TABS Take by mouth daily.    Selenium 200 MCG CAPS Take by mouth daily.    simvastatin (ZOCOR) 20 MG tablet Take 20 mg by mouth daily. Refills: 1    vitamin C (ASCORBIC ACID) 500 MG tablet Take 1,000 mg by mouth daily.     vitamin E 400 UNIT capsule Take 400 Units by mouth daily.    zinc gluconate 50 MG tablet Take 50 mg by mouth daily.      STOP taking these medications     hydrochlorothiazide (HYDRODIURIL) 25 MG tablet      saw palmetto 160 MG capsule      Turmeric Curcumin 500 MG CAPS      UNABLE TO FIND      verapamil (CALAN-SR) 120 MG CR tablet        No Known Allergies Follow-up Information    Follow up with Precious Reel, MD In 1 week.   Specialty:  Internal Medicine   Contact information:   [place]  [address] [phone]       Follow up with Sherren Mocha, MD. Call in 1 week.   Specialty:  Cardiology   Contact information:   [address] [place] [address] [phone]        The results of significant diagnostics from this hospitalization (including imaging, microbiology, ancillary and laboratory) are listed below for reference.    Significant Diagnostic Studies: Dg Chest 2 View  10/20/2014   CLINICAL DATA:  Syncope.  History of hypertension.  EXAM: CHEST  2 VIEW  COMPARISON:  04/27/2007.  FINDINGS: Borderline enlarged cardiac silhouette with an interval decrease in size. Clear lungs. Mildly prominent pulmonary vasculature without significant change. Mild central peribronchial thickening. Mild thoracic spine degenerative changes. Upper abdominal surgical clips.  IMPRESSION: 1. No acute abnormality. 2. Borderline cardiomegaly with mild chronic pulmonary vascular congestion. 3. Mild chronic bronchitic changes.   Electronically Signed   By: Claudie Revering M.D.   On: 10/20/2014 15:08   Ct Head Wo  Contrast  10/20/2014   CLINICAL DATA:  Patient with syncope while at church. No history of stroke or seizure.  EXAM: CT HEAD WITHOUT CONTRAST  TECHNIQUE: Contiguous axial images were obtained from the base of the skull through the vertex without intravenous contrast.  COMPARISON:  Brain MRI 03/18/2014 ; head CT 11/27/2002  FINDINGS: Ventricles and sulci are appropriate for patient's age. Periventricular and subcortical white matter hypodensity compatible with chronic small vessel ischemic changes. Chronic left basal ganglia lacunar infarct. No evidence for acute cortically based infarct, intracranial hemorrhage, mass lesion or mass-effect. Orbits are unremarkable. Mucosal thickening within the left maxillary sinus. Mastoid air cells are well aerated. Calvarium is  intact.  IMPRESSION: No acute intracranial process.  Chronic small vessel ischemic changes.   Electronically Signed   By: Lovey Newcomer M.D.   On: 10/20/2014 13:41   Mr Brain Wo Contrast  10/20/2014   CLINICAL DATA:  Syncopal episode at church this morning.  EXAM: MRI HEAD WITHOUT CONTRAST  TECHNIQUE: Multiplanar, multiecho pulse sequences of the brain and surrounding structures were obtained without intravenous contrast.  COMPARISON:  CT head without contrast 10/20/2014. MRI brain 03/18/2014  FINDINGS: No acute infarct, hemorrhage, or mass lesion is present. Remote lacunar infarcts of the pons are stable. There are remote lacunar infarcts in dilated perivascular spaces within the basal ganglia bilaterally. Periventricular and subcortical T2 changes bilaterally are similar to the prior study.  No acute infarct, hemorrhage, or mass lesion is present. No significant extraaxial fluid collection is present. The ventricles are proportionate to the degree of atrophy.  Abnormal signal is present in the left vertebral artery suggesting and slow or occluded flow. Flow is otherwise present in the major intracranial arteries.  The globes and orbits are intact. The  paranasal sinuses and mastoid air cells are clear.  Skullbase is within normal limits. Midline structures are unremarkable.  IMPRESSION: 1. No acute intracranial abnormality. 2. Remote lacunar infarcts of the brainstem and basal ganglia bilaterally are similar to the prior exam. 3. Advanced atrophy and white matter disease likely reflects the sequela of chronic microvascular ischemia.   Electronically Signed   By: San Morelle M.D.   On: 10/20/2014 17:58    Microbiology: No results found for this or any previous visit (from the past 240 hour(s)).   Labs: Basic Metabolic Panel:  Recent Labs Lab 10/20/14 1314 10/21/14 0650  NA 139 140  K 3.4* 3.4*  CL 100 102  CO2 28 25  GLUCOSE 140* 134*  BUN 18 14  CREATININE 1.02 1.00  CALCIUM 9.1 8.7   Liver Function Tests:  Recent Labs Lab 10/21/14 0650  AST 26  ALT 29  ALKPHOS 48  BILITOT 1.0  PROT 7.0  ALBUMIN 3.5   No results for input(s): LIPASE, AMYLASE in the last 168 hours. No results for input(s): AMMONIA in the last 168 hours. CBC:  Recent Labs Lab 10/20/14 1314  WBC 6.8  HGB 14.2  HCT 43.4  MCV 87.7  PLT 168   Cardiac Enzymes:  Recent Labs Lab 10/21/14 0926 10/21/14 1432 10/21/14 2030  TROPONINI <0.03 <0.03 <0.03   BNP: BNP (last 3 results)  Recent Labs  10/20/14 1314  BNP 78.1    ProBNP (last 3 results) No results for input(s): PROBNP in the last 8760 hours.  CBG:  Recent Labs Lab 10/21/14 0740 10/22/14 0805  GLUCAP 132* 119*       Signed:  Melodee Lupe A  [facility] 10/22/2014, 6:07 PM

## 2014-10-22 NOTE — Progress Notes (Signed)
OT Cancellation Note  Patient Details Name: [name] MRN: [id number] DOB: [birth year]   Cancelled Treatment:    Reason Eval/Treat Not Completed: OT screened, no needs identified, will sign off. Note pt is independent with PT and no concerns with balance, mobility. PT screened for OT needs and reports no self care concerns noted. Will sign off.  [place], [place] 10/22/2014, 8:20 AM

## 2014-10-22 NOTE — Progress Notes (Signed)
Subjective:  No complaints, up today without problems  Objective:  Vital Signs in the last 24 hours: Temp:  [97.7 F (36.5 C)-98.9 F (37.2 C)] 98 F (36.7 C) (04/19 1042) Pulse Rate:  [48-63] 48 (04/19 1042) Resp:  [16-20] 20 (04/19 1042) BP: (137-167)/(60-94) 167/78 mmHg (04/19 1042) SpO2:  [93 %-96 %] 93 % (04/19 1042) Weight:  [242 lb 6.4 oz (109.952 kg)] 242 lb 6.4 oz (109.952 kg) (04/19 0450)  Intake/Output from previous day:  Intake/Output Summary (Last 24 hours) at 10/22/14 1150 Last data filed at 10/22/14 0915  Gross per 24 hour  Intake    720 ml  Output      0 ml  Net    720 ml   . amLODipine  5 mg Oral Daily  . aspirin  81 mg Oral Daily  . carvedilol  6.25 mg Oral BID WC  . heparin  5,000 Units Subcutaneous 3 times per day  . hydrALAZINE  25 mg Oral BID  . lisinopril  20 mg Oral BID  . pantoprazole  40 mg Oral Daily  . simvastatin  20 mg Oral q1800  . sodium chloride  3 mL Intravenous Q12H    Physical Exam: General appearance: alert, cooperative and no distress Lungs: clear to auscultation bilaterally Heart: regular rate and rhythm Extremities: no edema Skin: Skin color, texture, turgor normal. No rashes or lesions Neurologic: Grossly normal   Rate: 48-78  Rhythm: normal sinus rhythm and sinus bradycardia  Lab Results:  Recent Labs  10/20/14 1314  WBC 6.8  HGB 14.2  PLT 168    Recent Labs  10/20/14 1314 10/21/14 0650  NA 139 140  K 3.4* 3.4*  CL 100 102  CO2 28 25  GLUCOSE 140* 134*  BUN 18 14  CREATININE 1.02 1.00    Recent Labs  10/21/14 1432 10/21/14 2030  TROPONINI <0.03 <0.03   No results for input(s): INR in the last 72 hours.  Marland Kitchen amLODipine  5 mg Oral Daily  . aspirin  81 mg Oral Daily  . carvedilol  6.25 mg Oral BID WC  . heparin  5,000 Units Subcutaneous 3 times per day  . hydrALAZINE  25 mg Oral BID  . lisinopril  20 mg Oral BID  . pantoprazole  40 mg Oral Daily  . simvastatin  20 mg Oral q1800  . sodium  chloride  3 mL Intravenous Q12H     Imaging: Dg Chest 2 View  10/20/2014   CLINICAL DATA:  Syncope.  History of hypertension.  EXAM: CHEST  2 VIEW  COMPARISON:  04/27/2007.  FINDINGS: Borderline enlarged cardiac silhouette with an interval decrease in size. Clear lungs. Mildly prominent pulmonary vasculature without significant change. Mild central peribronchial thickening. Mild thoracic spine degenerative changes. Upper abdominal surgical clips.  IMPRESSION: 1. No acute abnormality. 2. Borderline cardiomegaly with mild chronic pulmonary vascular congestion. 3. Mild chronic bronchitic changes.   Electronically Signed   By: Claudie Revering M.D.   On: 10/20/2014 15:08   Ct Head Wo Contrast  10/20/2014   CLINICAL DATA:  Patient with syncope while at church. No history of stroke or seizure.  EXAM: CT HEAD WITHOUT CONTRAST  TECHNIQUE: Contiguous axial images were obtained from the base of the skull through the vertex without intravenous contrast.  COMPARISON:  Brain MRI 03/18/2014 ; head CT 11/27/2002  FINDINGS: Ventricles and sulci are appropriate for patient's age. Periventricular and subcortical white matter hypodensity compatible with chronic small vessel ischemic changes. Chronic  left basal ganglia lacunar infarct. No evidence for acute cortically based infarct, intracranial hemorrhage, mass lesion or mass-effect. Orbits are unremarkable. Mucosal thickening within the left maxillary sinus. Mastoid air cells are well aerated. Calvarium is intact.  IMPRESSION: No acute intracranial process.  Chronic small vessel ischemic changes.   Electronically Signed   By: Lovey Newcomer M.D.   On: 10/20/2014 13:41   Mr Brain Wo Contrast  10/20/2014   CLINICAL DATA:  Syncopal episode at church this morning.  EXAM: MRI HEAD WITHOUT CONTRAST  TECHNIQUE: Multiplanar, multiecho pulse sequences of the brain and surrounding structures were obtained without intravenous contrast.  COMPARISON:  CT head without contrast 10/20/2014. MRI  brain 03/18/2014  FINDINGS: No acute infarct, hemorrhage, or mass lesion is present. Remote lacunar infarcts of the pons are stable. There are remote lacunar infarcts in dilated perivascular spaces within the basal ganglia bilaterally. Periventricular and subcortical T2 changes bilaterally are similar to the prior study.  No acute infarct, hemorrhage, or mass lesion is present. No significant extraaxial fluid collection is present. The ventricles are proportionate to the degree of atrophy.  Abnormal signal is present in the left vertebral artery suggesting and slow or occluded flow. Flow is otherwise present in the major intracranial arteries.  The globes and orbits are intact. The paranasal sinuses and mastoid air cells are clear.  Skullbase is within normal limits. Midline structures are unremarkable.  IMPRESSION: 1. No acute intracranial abnormality. 2. Remote lacunar infarcts of the brainstem and basal ganglia bilaterally are similar to the prior exam. 3. Advanced atrophy and white matter disease likely reflects the sequela of chronic microvascular ischemia.   Electronically Signed   By: San Morelle M.D.   On: 10/20/2014 17:58    Cardiac Studies: Echo pending  Assessment/Plan:  80 year old male with HTN, previously followed by Dr Rayford Halsted, admitted 10/20/14 for syncope and collapse. He was noted to be bradycardic and had PVCs and RBBB. Verapamil changed to Norvasc and beta blocker decreased.    Principal Problem:   Syncope and collapse Active Problems:   Bradycardia   Hypertension   Hx of Prostate cancer   Old lacunar strokes by MRI   RBBB   PVC's (premature ventricular contractions)   PLAN: EKG this am, echo pending.   Kerin Ransom PA-C [facility] [phone] 10/22/2014, 11:50 AM     Patient seen and examined. Agree with assessment and plan. ECG reviewed by me sinus brady at 53, PVC with retrograde P wave; now on amlodipine instead of verapamil. No chest pain, no dizziness. Echo not  yet done.   Troy Sine, MD, [facility] 10/22/2014 2:34 PM

## 2014-10-22 NOTE — Progress Notes (Signed)
UR completed 

## 2014-10-22 NOTE — Progress Notes (Signed)
  Echocardiogram 2D Echocardiogram has been performed.  [name] 10/22/2014, 4:29 PM

## 2014-10-22 NOTE — Progress Notes (Signed)
[name] discharged Home per MD order.  Discharge instructions reviewed and discussed with the patient, all questions and concerns answered. Copy of instructions, care notes and scripts given to patient.    Medication List    STOP taking these medications        hydrochlorothiazide 25 MG tablet  Commonly known as:  HYDRODIURIL     saw palmetto 160 MG capsule     Turmeric Curcumin 500 MG Caps     UNABLE TO FIND     verapamil 120 MG CR tablet  Commonly known as:  CALAN-SR      TAKE these medications        alendronate 70 MG tablet  Commonly known as:  FOSAMAX  Take 70 mg by mouth once a week.     amLODipine 5 MG tablet  Commonly known as:  NORVASC  Take 1 tablet (5 mg total) by mouth daily.     aspirin 81 MG chewable tablet  Chew 1 tablet (81 mg total) by mouth daily.     CALTRATE 600 PLUS-VIT D PO  Take 1 tablet by mouth daily.     carvedilol 6.25 MG tablet  Commonly known as:  COREG  Take 1 tablet (6.25 mg total) by mouth 2 (two) times daily with a meal.     cholecalciferol 1000 UNITS tablet  Commonly known as:  VITAMIN D  Take 1,000 Units by mouth daily.     GRAPE SEED EXTRACT PO  Take 2 capsules by mouth 2 (two) times daily.     hydrALAZINE 25 MG tablet  Commonly known as:  APRESOLINE  Take 25 mg by mouth 2 (two) times daily.     lisinopril 20 MG tablet  Commonly known as:  PRINIVIL,ZESTRIL  Take 1 tablet (20 mg total) by mouth 2 (two) times daily.     Magnesium 400 MG Tabs  Take by mouth daily.     metFORMIN 500 MG tablet  Commonly known as:  GLUCOPHAGE  Take 1 tablet (500 mg total) by mouth 2 (two) times daily with a meal.     Selenium 200 MCG Caps  Take by mouth daily.     simvastatin 20 MG tablet  Commonly known as:  ZOCOR  Take 20 mg by mouth daily.     vitamin C 500 MG tablet  Commonly known as:  ASCORBIC ACID  Take 1,000 mg by mouth daily.     vitamin E 400 UNIT capsule  Take 400 Units by mouth daily.     zinc gluconate 50 MG  tablet  Take 50 mg by mouth daily.        Patients skin is clean, dry and intact, no evidence of skin break down. IV site discontinued and catheter remains intact. Site without signs and symptoms of complications. Dressing and pressure applied.  Patient escorted to car by NT in a wheelchair,  no distress noted upon discharge.  Wynetta Emery, Cattaleya Wien C 10/22/2014 7:52 PM

## 2014-10-30 ENCOUNTER — Encounter: Payer: Self-pay | Admitting: *Deleted

## 2014-10-30 ENCOUNTER — Ambulatory Visit (INDEPENDENT_AMBULATORY_CARE_PROVIDER_SITE_OTHER): Payer: Medicare Other

## 2014-10-30 DIAGNOSIS — R55 Syncope and collapse: Secondary | ICD-10-CM

## 2014-10-30 NOTE — Progress Notes (Signed)
Patient ID: [name], male   DOB: 07-Nov-1934, 80 y.o.   MRN: [id number] [employer] 30 day cardiac event monitor applied to patient.

## 2014-11-21 ENCOUNTER — Telehealth: Payer: Self-pay | Admitting: Cardiovascular Disease

## 2014-11-21 ENCOUNTER — Other Ambulatory Visit: Payer: Self-pay | Admitting: Nurse Practitioner

## 2014-11-21 DIAGNOSIS — R55 Syncope and collapse: Secondary | ICD-10-CM

## 2014-11-21 DIAGNOSIS — R000 Tachycardia, unspecified: Secondary | ICD-10-CM

## 2014-11-21 NOTE — Telephone Encounter (Signed)
Attempted to call pt to endorse new order for Lexiscan and to give instructions for procedure. No answer- left message for pt to call and ask for a triage nurse. Will forward to triage for follow up on 11/22/14.

## 2014-11-21 NOTE — Telephone Encounter (Signed)
Received a phone call from [place] in regards to pt's event monitor. Pt noted to have an episode of tachycardia with 140-150 bpm. Spoke with pt and he states at the time of the event he was tugging and pushing on boxes in his basement. Pt states that he feels fine and did not feel his heart racing during occurrence. Had Ignacia Bayley, NP review monitor and he ordered for pt to have a Lexiscan prior to his appt with [name], PA-C on 12/04/14. Attempted to call pt back with new orders but had to leave a message to call back.

## 2014-11-22 NOTE — Telephone Encounter (Signed)
I spoke with the patient. He is aware of recommendations by Ignacia Bayley, NP for [facility] prior to his hospital follow up with [name], PA on 6/1.

## 2014-11-22 NOTE — Telephone Encounter (Signed)
Follow up      Pt was told to call and speak with a triage nurse

## 2014-11-25 ENCOUNTER — Telehealth: Payer: Self-pay | Admitting: Cardiovascular Disease

## 2014-11-25 NOTE — Telephone Encounter (Signed)
New Message       Pt calling stating that he is supposed to have his stress test done before his appt on 12/04/14 w/ [name], there are no open appts for this stress test before that date. Please call pt back and advise.

## 2014-11-25 NOTE — Telephone Encounter (Signed)
Pt scheduled for myoview on 11/27/14.

## 2014-11-26 ENCOUNTER — Telehealth (HOSPITAL_COMMUNITY): Payer: Self-pay | Admitting: *Deleted

## 2014-11-26 NOTE — Telephone Encounter (Signed)
Patient given detailed instructions per Myocardial Perfusion Study Information Sheet for test on 11/28/14 at 0815 Patient verbalized understanding. Everett Ehrler, Ranae [place]

## 2014-11-27 ENCOUNTER — Ambulatory Visit (HOSPITAL_COMMUNITY): Payer: Medicare Other | Attending: Nurse Practitioner

## 2014-11-27 ENCOUNTER — Encounter (HOSPITAL_COMMUNITY): Payer: Medicare Other

## 2014-11-27 DIAGNOSIS — R000 Tachycardia, unspecified: Secondary | ICD-10-CM | POA: Diagnosis not present

## 2014-11-27 DIAGNOSIS — R55 Syncope and collapse: Secondary | ICD-10-CM

## 2014-11-27 DIAGNOSIS — R079 Chest pain, unspecified: Secondary | ICD-10-CM | POA: Diagnosis not present

## 2014-11-27 LAB — MYOCARDIAL PERFUSION IMAGING
CHL CUP NUCLEAR SDS: 0
CHL CUP NUCLEAR SSS: 4
CHL CUP RESTING HR STRESS: 54 {beats}/min
CHL CUP STRESS STAGE 1 DBP: 89 mmHg
CHL CUP STRESS STAGE 1 HR: 50 {beats}/min
CHL CUP STRESS STAGE 2 GRADE: 0 %
CHL CUP STRESS STAGE 3 SPEED: 0 mph
CHL CUP STRESS STAGE 4 GRADE: 0 %
CHL CUP STRESS STAGE 4 SPEED: 0 mph
CHL CUP STRESS STAGE 5 DBP: 74 mmHg
CHL CUP STRESS STAGE 5 HR: 67 {beats}/min
CHL CUP STRESS STAGE 5 SBP: 141 mmHg
CHL CUP STRESS STAGE 5 SPEED: 0 mph
CHL CUP STRESS STAGE 6 SPEED: 0 mph
CSEPEW: 1 METS
CSEPPMHR: 41 %
LV sys vol: 84 mL
LVDIAVOL: 170 mL
NUC STRESS EF: 50 %
NUC STRESS TID: 0.98
Peak HR: 60 {beats}/min
RATE: 0.24
SRS: 4
Stage 1 Grade: 0 %
Stage 1 SBP: 153 mmHg
Stage 1 Speed: 0 mph
Stage 2 HR: 50 {beats}/min
Stage 2 Speed: 0 mph
Stage 3 DBP: 89 mmHg
Stage 3 Grade: 0 %
Stage 3 HR: 56 {beats}/min
Stage 3 SBP: 161 mmHg
Stage 4 HR: 60 {beats}/min
Stage 5 Grade: 0 %
Stage 6 DBP: 70 mmHg
Stage 6 Grade: 0 %
Stage 6 HR: 57 {beats}/min
Stage 6 SBP: 134 mmHg

## 2014-11-27 MED ORDER — TECHNETIUM TC 99M SESTAMIBI GENERIC - CARDIOLITE
33.0000 | Freq: Once | INTRAVENOUS | Status: AC | PRN
  Administered 2014-11-27: 33 via INTRAVENOUS

## 2014-11-27 MED ORDER — REGADENOSON 0.4 MG/5ML IV SOLN
0.4000 mg | Freq: Once | INTRAVENOUS | Status: AC
  Administered 2014-11-27: 0.4 mg via INTRAVENOUS

## 2014-11-27 MED ORDER — TECHNETIUM TC 99M SESTAMIBI GENERIC - CARDIOLITE
11.0000 | Freq: Once | INTRAVENOUS | Status: AC | PRN
  Administered 2014-11-27: 11 via INTRAVENOUS

## 2014-12-03 NOTE — Progress Notes (Signed)
Cardiology Office Note   Date:  12/04/2014   ID:  [name], DOB 30-Dec-1934, MRN [id number]  PCP:  Precious Reel, MD  Cardiologist:  Dr. Sherren Mocha     Chief Complaint  Patient presents with  . Loss of Consciousness     History of Present Illness: [name] is a 80 y.o. male with a hx of HTN, Prostate CA, PUD, OSA.  He was admitted in 4/16 with syncope.  Tele demonstrated sinus brady.  He was seen by Dr. Sherren Mocha who felt his hx was suggestive of neuro depressor event.   Given brady, his Verapamil was DC'd.  Carvedilol dose was reduced.  Echo demonstrated normal LVF, mild diastolic dysfunction, mild AI, mild MR, mod LAE.  Old lacunar infarct was noted on brain MRI.  Carotid US demonstrated no significant ICA stenosis (1-39% bilaterally).  OP event monitor was arranged.  Apparently there was an episode of "tachycardia" demonstrating a HR 140-150.  Patient was set up for a nuclear stress test.  This was low risk and neg for ischemia.  He returns for FU.  Since discharge from the hospital, he denies chest pain, significant shortness of breath, orthopnea, PND or edema. He denies further syncope. He denies dizziness or near syncope. Recently treated for bronchitis by primary care. Symptoms are improving.   Studies/Reports Reviewed Today:  Myoview 11/27/14 Study Impression There is no prior study for comparison.The ejection fraction is 50% with no focal wall motion abnormalities.  This is a low risk scan. There is no scar or ischemia. There is normal wall motion.  Echo 10/22/14 - mildconcentric hypertrophy. EF 55% to 60%. Wallmotion was normal; Grade 1 diastolic dysfunction. - Aortic valve: There was mild regurgitation. - Aortic root: The aortic root was normal in size. - Mitral valve: There was mild regurgitation. - Left atrium: The atrium was moderately dilated. - Right ventricle: Systolic function was normal. - Right atrium: The atrium was normal in size. - Tricuspid  valve: There was mild regurgitation. - Pulmonary arteries: Systolic pressure was within the normalrange. - Inferior vena cava: The vessel was normal in size. - Pericardium, extracardiac: There was no pericardial effusion.  Carotid US 10/22/14 Bilateral: 1-39% ICA stenosis  Past Medical History  Diagnosis Date  . Hypertension   . Ulcer     peptic  . Chronic kidney disease     nephrolithiasis right kidney  . Use of leuprolide acetate (Lupron) 06/2005-11/2006    02/2008 degarelix 02/2008 x 3 doses  . Prostate cancer 12/09/03 dx    Prostate ca/adenocarcinoma,gleason=3+3=6    pSA 4.6  . Gynecomastia 04/21/11    seen by Dr.Murray, no rad tx  . Hydroureteronephrosis     right   . Osteopenia   . Cancer 2006    L-2 solitary metastatic lesion/no rad tx ,asymptomatic  . Abnormal PSA 01/17/2012    10.38/4/5/ PSA:2013=6.96/ PSA 07/08/2011=6.26/ PSA 2006=8.6  . Blood transfusion without reported diagnosis     during vagotomy 1976  . Sleep apnea   . History of echocardiogram     echo 4/16:  mild LVH, EF 55-60%, Gr 1 DD, no RWMA, mild AI, mild MR, mod LAE, mild TR  . Hx of cardiovascular stress test     Myoview 5/16:  EF 50%, no scar or ischemia, low risk    Past Surgical History  Procedure Laterality Date  . Prostate biopsy  12/09/03    adenocarcinoma,glerason: 3+3=6  . Vagotomy  1978    partial  Current Outpatient Prescriptions  Medication Sig Dispense Refill  . alendronate (FOSAMAX) 70 MG tablet Take 70 mg by mouth once a week.  1  . amLODipine (NORVASC) 5 MG tablet Take 1 tablet (5 mg total) by mouth daily. 30 tablet 0  . aspirin 81 MG chewable tablet Chew 1 tablet (81 mg total) by mouth daily. (Patient taking differently: Chew 81 mg by mouth every 3 (three) days. ) 30 tablet 0  . Calcium-Vitamin D (CALTRATE 600 PLUS-VIT D PO) Take 1 tablet by mouth daily.     . carvedilol (COREG) 6.25 MG tablet Take 1 tablet (6.25 mg total) by mouth 2 (two) times daily with a meal. 60 tablet 0  .  cefdinir (OMNICEF) 300 MG capsule Take 300 mg by mouth 2 (two) times daily.   0  . cholecalciferol (VITAMIN D) 1000 UNITS tablet Take 1,000 Units by mouth daily.    Marland Kitchen GRAPE SEED EXTRACT PO Take 2 capsules by mouth 2 (two) times daily.    . hydrALAZINE (APRESOLINE) 25 MG tablet Take 25 mg by mouth 2 (two) times daily.  1  . hydrochlorothiazide (HYDRODIURIL) 25 MG tablet Take 25 mg by mouth daily.   1  . lisinopril (PRINIVIL,ZESTRIL) 20 MG tablet Take 1 tablet (20 mg total) by mouth 2 (two) times daily. 60 tablet 0  . Magnesium 400 MG TABS Take by mouth daily.    . metFORMIN (GLUCOPHAGE) 500 MG tablet Take 1 tablet (500 mg total) by mouth 2 (two) times daily with a meal. 60 tablet 0  . Selenium 200 MCG CAPS Take by mouth daily.    . simvastatin (ZOCOR) 20 MG tablet Take 20 mg by mouth daily.  1  . vitamin C (ASCORBIC ACID) 500 MG tablet Take 1,000 mg by mouth daily.     . vitamin E 400 UNIT capsule Take 400 Units by mouth daily.    Marland Kitchen zinc gluconate 50 MG tablet Take 50 mg by mouth daily.     No current facility-administered medications for this visit.    Allergies:   Review of patient's allergies indicates no known allergies.    Social History:  The patient  reports that he quit smoking about 30 years ago. His smoking use included Pipe. He has never used smokeless tobacco. He reports that he does not drink alcohol or use illicit drugs.   Family History:  The patient's family history is negative for Colon cancer and Stroke.    ROS:   Please see the history of present illness.   Review of Systems  Musculoskeletal:       Left great toe pain  All other systems reviewed and are negative.    PHYSICAL EXAM: VS:  BP 100/68 mmHg  Pulse 68  Ht 6\' 1"  (1.854 m)  Wt 237 lb (107.502 kg)  BMI 31.27 kg/m2    Wt Readings from Last 3 Encounters:  12/04/14 237 lb (107.502 kg)  11/27/14 236 lb (107.049 kg)  10/22/14 242 lb 6.4 oz (109.952 kg)     GEN: Well nourished, well developed, in no  acute distress HEENT: normal Neck: no JVD,  no masses Cardiac:  Normal S1/S2, RRR; no murmur ,  no rubs or gallops, no edema   Respiratory:  clear to auscultation bilaterally, no wheezing, rhonchi or rales. GI: soft, nontender, nondistended, + BS MS: no deformity or atrophy Skin: warm and dry  Neuro:  CNs II-XII intact, Strength and sensation are intact Psych: Normal affect   EKG:  EKG  is ordered today.  It demonstrates:   NSR, HR 68, RBBB, inferolateral T-wave inversions   Recent Labs: 10/20/2014: B Natriuretic Peptide 78.1; Hemoglobin 14.2; Platelets 168 10/21/2014: ALT 29 12/04/2014: BUN 23; Creatinine 1.22; Magnesium 2.1; Potassium 3.7; Sodium 139    Lipid Panel    Component Value Date/Time   CHOL 146 10/21/2014 0926   TRIG 203* 10/21/2014 0926   HDL 35* 10/21/2014 0926   CHOLHDL 4.2 10/21/2014 0926   VLDL 41* 10/21/2014 0926   LDLCALC 70 10/21/2014 0926      ASSESSMENT AND PLAN:  Syncope and collapse:  No recurrence.  Symptoms at time of presentation sound c/w neuro-cardiogenic syncope.  He has not had a recurrence.   Essential hypertension:  Controlled.   Old lacunar strokes by MRI:  Incidental finding at time of hospitalization.  Continue ASA, statin.  FU with PCP.  Hx of Prostate cancer:  FU with urology as planned  Chronic kidney disease, stage 2 (mild) - Plan: Basic Metabolic Panel (BMET)  Bradycardia:  HR improved.  Continue current dose of beta-blocker.   Wide-complex tachycardia:  I reviewed his event monitor today.  It demonstrates NSR and one episode of WCT with HR 155.  I reviewed this with Dr. Cleatis Polka (DOD).  It is not clear if this is VT or some type of aberrant rhythm.  It does not appear to be related to his syncopal episode as this was most c/w neurocardiogenic syncope.  His syncopal episode does not sound consistent with a lethal arrhythmia.  His echo demonstrated normal LVF.  His stress test was low risk and did not suggest ischemia.  He is  already on a beta-blocker.  We have decided to refer him to EP to further evaluate and make recommendations regarding his WCT, if any.  I reviewed this with the patient today and he understands.  Check BMET, Mg2+ today as well.    Current medicines are reviewed at length with the patient today.  Concerns regarding medicines are as outlined above.  The following changes have been made:    None    Labs/ tests ordered today include:  Orders Placed This Encounter  Procedures  . Basic Metabolic Panel (BMET)  . Magnesium  . EKG 12-Lead    Disposition:   FU with Dr. Sherren Mocha 3 mos.  Refer to EP as planned.    Signed, Versie Starks, MHS 12/04/2014 4:26 PM    [facility] [place], [place], [place]  [address] Phone: [phone]; Fax: [phone]

## 2014-12-04 ENCOUNTER — Ambulatory Visit (INDEPENDENT_AMBULATORY_CARE_PROVIDER_SITE_OTHER): Payer: Medicare Other | Admitting: Physician Assistant

## 2014-12-04 ENCOUNTER — Encounter: Payer: Self-pay | Admitting: Physician Assistant

## 2014-12-04 VITALS — BP 100/68 | HR 68 | Ht 73.0 in | Wt 237.0 lb

## 2014-12-04 DIAGNOSIS — I10 Essential (primary) hypertension: Secondary | ICD-10-CM | POA: Diagnosis not present

## 2014-12-04 DIAGNOSIS — R55 Syncope and collapse: Secondary | ICD-10-CM | POA: Diagnosis not present

## 2014-12-04 DIAGNOSIS — N182 Chronic kidney disease, stage 2 (mild): Secondary | ICD-10-CM | POA: Diagnosis not present

## 2014-12-04 DIAGNOSIS — R001 Bradycardia, unspecified: Secondary | ICD-10-CM

## 2014-12-04 DIAGNOSIS — I472 Ventricular tachycardia: Secondary | ICD-10-CM

## 2014-12-04 DIAGNOSIS — R000 Tachycardia, unspecified: Secondary | ICD-10-CM

## 2014-12-04 DIAGNOSIS — I639 Cerebral infarction, unspecified: Secondary | ICD-10-CM

## 2014-12-04 DIAGNOSIS — C61 Malignant neoplasm of prostate: Secondary | ICD-10-CM | POA: Diagnosis not present

## 2014-12-04 LAB — BASIC METABOLIC PANEL
BUN: 23 mg/dL (ref 6–23)
CO2: 33 meq/L — AB (ref 19–32)
CREATININE: 1.22 mg/dL (ref 0.40–1.50)
Calcium: 9.6 mg/dL (ref 8.4–10.5)
Chloride: 100 mEq/L (ref 96–112)
GFR: 61.11 mL/min (ref >60.00)
GLUCOSE: 121 mg/dL — AB (ref 70–99)
Potassium: 3.7 mEq/L (ref 3.5–5.1)
Sodium: 139 mEq/L (ref 135–145)

## 2014-12-04 LAB — MAGNESIUM: MAGNESIUM: 2.1 mg/dL (ref 1.5–2.5)

## 2014-12-04 NOTE — Patient Instructions (Addendum)
Medication Instructions:  Your physician recommends that you continue on your current medications as directed. Please refer to the Current Medication list given to you today.   Labwork: TODAY BMET, MAGNESIUM LEVEL  Testing/Procedures: NONE  Follow-Up: 1. YOU WILL NEED TO FOLLOW UP WITH DR. Burt Knack 3 MONTHS EITHER 03/12/15 10:15 OR 03/17/15 8:30, 10 AM  2. YOU ARE BEING REFERRED TO ELECTROPHYSIOLOGY DX WIDE COMPLEX TACHYCARDIA  Any Other Special Instructions Will Be Listed Below (If Applicable).

## 2014-12-05 ENCOUNTER — Telehealth: Payer: Self-pay | Admitting: *Deleted

## 2014-12-05 NOTE — Telephone Encounter (Signed)
Pt notified of lab results with verbal understanding by phone to results.

## 2014-12-06 ENCOUNTER — Encounter: Payer: Self-pay | Admitting: Internal Medicine

## 2014-12-06 ENCOUNTER — Ambulatory Visit (INDEPENDENT_AMBULATORY_CARE_PROVIDER_SITE_OTHER): Payer: Medicare Other | Admitting: Internal Medicine

## 2014-12-06 VITALS — BP 134/74 | HR 64 | Ht 73.0 in | Wt 239.8 lb

## 2014-12-06 DIAGNOSIS — I4729 Other ventricular tachycardia: Secondary | ICD-10-CM

## 2014-12-06 DIAGNOSIS — I472 Ventricular tachycardia: Secondary | ICD-10-CM | POA: Diagnosis not present

## 2014-12-06 DIAGNOSIS — R55 Syncope and collapse: Secondary | ICD-10-CM

## 2014-12-06 NOTE — Patient Instructions (Signed)
Medication Instructions: - no changes  Labwork: - none  Procedures/Testing: - none  Follow-Up: - As needed with Dr. Rayann Heman  Any Additional Special Instructions Will Be Listed Below (If Applicable). - none

## 2014-12-06 NOTE — Progress Notes (Signed)
Electrophysiology Office Note   Date:  12/06/2014   ID:  [name], DOB [birth year], MRN [id number]  PCP:  Calvin Reel, MD  Cardiologist:  Dr [name] Primary Electrophysiologist: Calvin Grayer, MD    Chief Complaint  Patient presents with  . Wide complex tachycardia     History of Present Illness: [name] is a 80 y.o. male who presents today for electrophysiology evaluation.   He presents for evaluation of wide complex tachycardia He has a h/o HTN, Prostate CA, and PUD s/p vagotomy.  He was admitted in 4/16 with syncope. He reports that he was at church and he was very hot.  He had a suit on and the room was very hot.  He began sweating and then became nauseated.  He then had brief loss of consciousness.  He woke with people standing over him.  He did not require CPR or any real intervention.  He took several minutes to regain composure.  He was evaluated at [facility] and his workup was unremarkable.  He was noted to be bradycardic.  His verapamil was stopped and  Carvedilol dose was reduced. Echo demonstrated normal LVF, mild diastolic dysfunction, mild AI, mild MR, mod LAE. Old lacunar infarct was noted on brain MRI. Carotid US demonstrated no significant ICA stenosis (1-39% bilaterally). OP event monitor was arranged. His event monitor documented nonsustained ventricular tachycardia at 140 bpm.  He reports that at the time of the episode, he was working vigorously in his basement trying to get widow screens out of a very tight space.  He was pulling on them very hard and using all of his energy at the time.  He says that he felt "fine" and was otherwise without chest pain, palpitations, dizziness, presyncope or syncope.    He has passed out remotely in the setting of a bleeding ulcer but has had no other episodes.   Today, he denies symptoms of palpitations, chest pain, shortness of breath, orthopnea, PND, lower extremity edema, claudication,  leeding, or neurologic sequela.   His primary concern today is with gout.  He is tolerating medications without difficulties and is otherwise without complaint today.    Past Medical History  Diagnosis Date  . Hypertension   . Ulcer     peptic  . Chronic kidney disease     nephrolithiasis right kidney  . Use of leuprolide acetate (Lupron) 06/2005-11/2006    02/2008 degarelix 02/2008 x 3 doses  . Prostate cancer 12/09/03 dx    Prostate ca/adenocarcinoma,gleason=3+3=6    pSA 4.6  . Gynecomastia 04/21/11    seen by Dr.Murray, no rad tx  . Hydroureteronephrosis     right   . Osteopenia   . Cancer 2006    L-2 solitary metastatic lesion/no rad tx ,asymptomatic  . Abnormal PSA 01/17/2012    10.38/4/5/ PSA:2013=6.96/ PSA 07/08/2011=6.26/ PSA 2006=8.6  . Blood transfusion without reported diagnosis     during vagotomy 1976  . Sleep apnea     pt unaware  . History of echocardiogram     echo 4/16:  mild LVH, EF 55-60%, Gr 1 DD, no RWMA, mild AI, mild MR, mod LAE, mild TR  . Hx of cardiovascular stress test     Myoview 5/16:  EF 50%, no scar or ischemia, low risk  . Overweight   . Stroke     asymptomatic, found on head imaging   Past Surgical History  Procedure Laterality Date  . Prostate biopsy  12/09/03    adenocarcinoma,glerason:  3+3=6  . Vagotomy  1978    partial     Current Outpatient Prescriptions  Medication Sig Dispense Refill  . alendronate (FOSAMAX) 70 MG tablet Take 70 mg by mouth once a week.  1  . amLODipine (NORVASC) 5 MG tablet Take 1 tablet (5 mg total) by mouth daily. 30 tablet 0  . aspirin 81 MG chewable tablet Chew 81 mg by mouth every 3 (three) days.    . Calcium-Vitamin D (CALTRATE 600 PLUS-VIT D PO) Take 1 tablet by mouth daily.     . carvedilol (COREG) 6.25 MG tablet Take 1 tablet (6.25 mg total) by mouth 2 (two) times daily with a meal. 60 tablet 0  . cefdinir (OMNICEF) 300 MG capsule Take 300 mg by mouth 2 (two) times daily.   0  . cholecalciferol (VITAMIN D) 1000 UNITS tablet Take 1,000 Units  by mouth daily.    Marland Kitchen GRAPE SEED EXTRACT PO Take 2 capsules by mouth 2 (two) times daily.    . hydrALAZINE (APRESOLINE) 25 MG tablet Take 25 mg by mouth 2 (two) times daily.  1  . hydrochlorothiazide (HYDRODIURIL) 25 MG tablet Take 25 mg by mouth daily.   1  . lisinopril (PRINIVIL,ZESTRIL) 20 MG tablet Take 1 tablet (20 mg total) by mouth 2 (two) times daily. 60 tablet 0  . Magnesium 400 MG TABS Take 1 tablet by mouth daily.     . metFORMIN (GLUCOPHAGE) 500 MG tablet Take 1 tablet (500 mg total) by mouth 2 (two) times daily with a meal. 60 tablet 0  . Selenium 200 MCG CAPS Take 1 capsule by mouth daily.     . simvastatin (ZOCOR) 20 MG tablet Take 20 mg by mouth daily.  1  . vitamin C (ASCORBIC ACID) 500 MG tablet Take 1,000 mg by mouth daily.     . vitamin E 400 UNIT capsule Take 400 Units by mouth daily.    Marland Kitchen zinc gluconate 50 MG tablet Take 50 mg by mouth daily.     No current facility-administered medications for this visit.    Allergies:   Review of patient's allergies indicates no known allergies.   Social History:  The patient  reports that he quit smoking about 30 years ago. His smoking use included Pipe. He has never used smokeless tobacco. He reports that he does not drink alcohol or use illicit drugs.   Family History:  The patient's  family history is negative for Colon cancer and Stroke.    ROS:  Please see the history of present illness.   All other systems are reviewed and negative.    PHYSICAL EXAM: VS:  BP 134/74 mmHg  Pulse 64  Ht 6\' 1"  (1.854 m)  Wt 108.773 kg (239 lb 12.8 oz)  BMI 31.64 kg/m2 , BMI Body mass index is 31.64 kg/(m^2). GEN: Well nourished, well developed, in no acute distress HEENT: normal Neck: no JVD, carotid bruits, or masses Cardiac: RRR; no murmurs, rubs, or gallops,no edema  Respiratory:  clear to auscultation bilaterally, normal work of breathing GI: soft, nontender, nondistended, + BS MS: no deformity or atrophy Skin: warm and dry    Neuro:  Strength and sensation are intact Psych: euthymic mood, full affect  EKG:  EKG from 6/1/ is reviewed   Recent Labs: 10/20/2014: B Natriuretic Peptide 78.1; Hemoglobin 14.2; Platelets 168 10/21/2014: ALT 29 12/04/2014: BUN 23; Creatinine 1.22; Magnesium 2.1; Potassium 3.7; Sodium 139    Lipid Panel     Component Value Date/Time  CHOL 146 10/21/2014 0926   TRIG 203* 10/21/2014 0926   HDL 35* 10/21/2014 0926   CHOLHDL 4.2 10/21/2014 0926   VLDL 41* 10/21/2014 0926   LDLCALC 70 10/21/2014 0926     Wt Readings from Last 3 Encounters:  12/06/14 108.773 kg (239 lb 12.8 oz)  12/04/14 107.502 kg (237 lb)  11/27/14 107.049 kg (236 lb)      Other studies Reviewed: Additional studies/ records that were reviewed today include: Leta Speller notes are reviewed,  Event monitor is reviewed  Review of the above records today demonstrates: nonsustained VT   ASSESSMENT AND PLAN:  1.  NSVT Asymptomatic Preserved EF, benign appearing ekg No further workup planned If he develops symptoms of arrhythmia then further workup could be considered  2. Recent syncope Clearly neurocardiogenic by history No further workup planned  EP to see as needed going forward.   Army Fossa, MD  12/06/2014 10:49 AM     [facility] [address] [place] [address] [phone] (office) [phone] (fax)

## 2014-12-08 DIAGNOSIS — I472 Ventricular tachycardia: Secondary | ICD-10-CM | POA: Insufficient documentation

## 2014-12-08 DIAGNOSIS — I4729 Other ventricular tachycardia: Secondary | ICD-10-CM | POA: Insufficient documentation

## 2015-03-12 ENCOUNTER — Ambulatory Visit: Payer: Medicare Other | Admitting: Cardiovascular Disease

## 2015-07-03 ENCOUNTER — Ambulatory Visit (INDEPENDENT_AMBULATORY_CARE_PROVIDER_SITE_OTHER): Payer: Medicare Other | Admitting: Cardiovascular Disease

## 2015-07-03 ENCOUNTER — Encounter: Payer: Self-pay | Admitting: Cardiovascular Disease

## 2015-07-03 VITALS — BP 146/94 | HR 64 | Ht 73.0 in | Wt 251.6 lb

## 2015-07-03 DIAGNOSIS — I10 Essential (primary) hypertension: Secondary | ICD-10-CM | POA: Diagnosis not present

## 2015-07-03 DIAGNOSIS — R600 Localized edema: Secondary | ICD-10-CM | POA: Diagnosis not present

## 2015-07-03 NOTE — Progress Notes (Signed)
Cardiology Office Note Date:  07/03/2015   ID:  [name], DOB 10-29-1934, MRN [id number]  PCP:  Precious Reel, MD  Cardiologist:  Sherren Mocha, MD    Chief Complaint  Patient presents with  . routine 6 month follow up    denies cp/sob    +RIGHT leg edema  . Hypertension   History of Present Illness: [name] is a 80 y.o. male who presents for follow-up evaluation. I saw him in consultation in April when he was admitted for syncope in [place]. Felt to be vasovagal in nature after undergoing evaluation. Asked today if he's had any further problems and he says 'I don't go to that [facility] anymore.'  He dropped an object on his right leg a few weeks ago and has had a lot of pain and swelling since then. Slowly getting better but not much change in swelling yet. No cardiac symptoms at this time. Today, he denies symptoms of palpitations, chest pain, shortness of breath, orthopnea, PND, dizziness, or recurrence of syncope. Was recently seen by Dr [name] and notes BP was good at that visit.    Past Medical History  Diagnosis Date  . Hypertension   . Ulcer     peptic  . Chronic kidney disease     nephrolithiasis right kidney  . Use of leuprolide acetate (Lupron) 06/2005-11/2006    02/2008 degarelix 02/2008 x 3 doses  . Prostate cancer ([place]) 12/09/03 dx    Prostate ca/adenocarcinoma,gleason=3+3=6    pSA 4.6  . Gynecomastia 04/21/11    seen by Dr.Murray, no rad tx  . Hydroureteronephrosis     right   . Osteopenia   . Cancer [facility]) 2006    L-2 solitary metastatic lesion/no rad tx ,asymptomatic  . Abnormal PSA 01/17/2012    10.38/4/5/ PSA:2013=6.96/ PSA 07/08/2011=6.26/ PSA 2006=8.6  . Blood transfusion without reported diagnosis     during vagotomy 1976  . Sleep apnea     pt unaware  . History of echocardiogram     echo 4/16:  mild LVH, EF 55-60%, Gr 1 DD, no RWMA, mild AI, mild MR, mod LAE, mild TR  . Hx of cardiovascular stress test     Myoview 5/16:  EF 50%, no scar or  ischemia, low risk  . Overweight   . Stroke [facility])     asymptomatic, found on head imaging    Past Surgical History  Procedure Laterality Date  . Prostate biopsy  12/09/03    adenocarcinoma,glerason: 3+3=6  . Vagotomy  1978    partial    Current Outpatient Prescriptions  Medication Sig Dispense Refill  . alendronate (FOSAMAX) 70 MG tablet Take 70 mg by mouth once a week.  1  . aspirin 81 MG chewable tablet Chew 81 mg by mouth every 3 (three) days.    . Calcium-Vitamin D (CALTRATE 600 PLUS-VIT D PO) Take 1 tablet by mouth daily.     Marland Kitchen lisinopril (PRINIVIL,ZESTRIL) 20 MG tablet Take 1 tablet (20 mg total) by mouth 2 (two) times daily. 60 tablet 0  . simvastatin (ZOCOR) 20 MG tablet Take 20 mg by mouth daily.  1   No current facility-administered medications for this visit.    Allergies:   Review of patient's allergies indicates no known allergies.   Social History:  The patient  reports that he quit smoking about 31 years ago. His smoking use included Pipe. He has never used smokeless tobacco. He reports that he does not drink alcohol or use illicit drugs.  Family History:  The patient's family history is negative for Colon cancer and Stroke.    ROS:  Please see the history of present illness.  Otherwise, review of systems is positive for leg swelling, orthopnea, leg pain.  All other systems are reviewed and negative.    PHYSICAL EXAM: VS:  BP 146/94 mmHg  Pulse 64  Ht 6\' 1"  (1.854 m)  Wt 251 lb 9.6 oz (114.125 kg)  BMI 33.20 kg/m2 , BMI Body mass index is 33.2 kg/(m^2). GEN: Well nourished, well developed, in no acute distress HEENT: normal Neck: no JVD, no masses. No carotid bruits Cardiac: RRR without murmur or gallop                Respiratory:  clear to auscultation bilaterally, normal work of breathing GI: soft, nontender, nondistended, + BS MS: no deformity or atrophy Ext: 2+ edema right lower leg/foot, pedal pulses 2+= bilaterally Skin: warm and dry, no  rash Neuro:  Strength and sensation are intact Psych: euthymic mood, full affect  EKG:  EKG is ordered today. The ekg ordered today shows  Normal sinus rhythm with right bundle branch block, heart rate 82 bpm.  Recent Labs: 10/20/2014: B Natriuretic Peptide 78.1; Hemoglobin 14.2; Platelets 168 10/21/2014: ALT 29 12/04/2014: BUN 23; Creatinine, Ser 1.22; Magnesium 2.1; Potassium 3.7; Sodium 139   Lipid Panel     Component Value Date/Time   CHOL 146 10/21/2014 0926   TRIG 203* 10/21/2014 0926   HDL 35* 10/21/2014 0926   CHOLHDL 4.2 10/21/2014 0926   VLDL 41* 10/21/2014 0926   LDLCALC 70 10/21/2014 0926     Wt Readings from Last 3 Encounters:  07/03/15 251 lb 9.6 oz (114.125 kg)  12/06/14 239 lb 12.8 oz (108.773 kg)  12/04/14 237 lb (107.502 kg)    Cardiac Studies Reviewed: 2D Echo 10/22/2014: Study Conclusions  - Left ventricle: The cavity size was normal. There was mild concentric hypertrophy. Systolic function was normal. The estimated ejection fraction was in the range of 55% to 60%. Wall motion was normal; there were no regional wall motion abnormalities. Doppler parameters are consistent with abnormal left ventricular relaxation (grade 1 diastolic dysfunction). - Aortic valve: There was mild regurgitation. - Aortic root: The aortic root was normal in size. - Mitral valve: There was mild regurgitation. - Left atrium: The atrium was moderately dilated. - Right ventricle: Systolic function was normal. - Right atrium: The atrium was normal in size. - Tricuspid valve: There was mild regurgitation. - Pulmonary arteries: Systolic pressure was within the normal range. - Inferior vena cava: The vessel was normal in size. - Pericardium, extracardiac: There was no pericardial effusion.  MPS 11/27/2014: Rest Perfusion There is normal uptake in all segments.    Stress Perfusion There is normal uptake in all segments.    ASSESSMENT AND PLAN: 1.  Syncope,  neurocardiogenic: no recurrence. Discussed importance of adequate hydration.  2. NSVT: seen by Dr Rayann Heman in June, observation recommended in setting normal LV function and low-risk nuclear scan.  3. Essential HTN: continue to monitor. BP in good range at recent visit with Dr [name]  4. Right leg injury/swelling: order venous duplex next week if symptoms not improved. Suspect soft tissue injury but reasonable to evaluate for DVT if symptoms persist.  Current medicines are reviewed with the patient today.  The patient does not have concerns regarding medicines.  Labs/ tests ordered today include:  No orders of the defined types were placed in this encounter.  Disposition:   FU one year  Signed, Sherren Mocha, MD  07/03/2015 3:37 PM    [facility] [place], [place], [place]  [address] Phone: [phone]; Fax: [phone]

## 2015-07-03 NOTE — Patient Instructions (Signed)
Medication Instructions:  Your physician recommends that you continue on your current medications as directed. Please refer to the Current Medication list given to you today.  Labwork: No new orders.   Testing/Procedures: Your physician has requested that you have a RIGHT lower extremity venous duplex in 1 WEEK. This test is an ultrasound of the veins in the legs. It looks at venous blood flow that carries blood from the heart to the legs. Allow one hour for a Lower Venous exam. There are no restrictions or special instructions.  Follow-Up: Your physician wants you to follow-up in: 1 YEAR with Dr Burt Knack.  You will receive a reminder letter in the mail two months in advance. If you don't receive a letter, please call our office to schedule the follow-up appointment.   Any Other Special Instructions Will Be Listed Below (If Applicable).     If you need a refill on your cardiac medications before your next appointment, please call your pharmacy.

## 2015-07-11 ENCOUNTER — Ambulatory Visit (HOSPITAL_COMMUNITY)
Admission: RE | Admit: 2015-07-11 | Discharge: 2015-07-11 | Disposition: A | Discharge status: Home / Self Care | Payer: Medicare Other | Source: Ambulatory Visit | Attending: Cardiology | Admitting: Cardiology

## 2015-07-11 DIAGNOSIS — I129 Hypertensive chronic kidney disease with stage 1 through stage 4 chronic kidney disease, or unspecified chronic kidney disease: Secondary | ICD-10-CM | POA: Diagnosis not present

## 2015-07-11 DIAGNOSIS — R600 Localized edema: Secondary | ICD-10-CM

## 2015-07-11 DIAGNOSIS — R2241 Localized swelling, mass and lump, right lower limb: Secondary | ICD-10-CM | POA: Diagnosis not present

## 2015-07-11 DIAGNOSIS — N189 Chronic kidney disease, unspecified: Secondary | ICD-10-CM | POA: Insufficient documentation

## 2015-07-19 ENCOUNTER — Inpatient Hospital Stay (HOSPITAL_COMMUNITY)
Admission: EM | Admit: 2015-07-19 | Discharge: 2015-07-20 | DRG: 305 | Disposition: A | Discharge status: Home / Self Care | Payer: Medicare Other | Attending: Internal Medicine | Admitting: Internal Medicine

## 2015-07-19 ENCOUNTER — Emergency Department (HOSPITAL_COMMUNITY): Payer: Medicare Other

## 2015-07-19 ENCOUNTER — Encounter (HOSPITAL_COMMUNITY): Payer: Self-pay | Admitting: Emergency Medicine

## 2015-07-19 DIAGNOSIS — N179 Acute kidney failure, unspecified: Secondary | ICD-10-CM | POA: Diagnosis present

## 2015-07-19 DIAGNOSIS — Z6831 Body mass index (BMI) 31.0-31.9, adult: Secondary | ICD-10-CM

## 2015-07-19 DIAGNOSIS — C61 Malignant neoplasm of prostate: Secondary | ICD-10-CM | POA: Diagnosis present

## 2015-07-19 DIAGNOSIS — Z8673 Personal history of transient ischemic attack (TIA), and cerebral infarction without residual deficits: Secondary | ICD-10-CM

## 2015-07-19 DIAGNOSIS — I4510 Unspecified right bundle-branch block: Secondary | ICD-10-CM | POA: Diagnosis present

## 2015-07-19 DIAGNOSIS — I160 Hypertensive urgency: Secondary | ICD-10-CM | POA: Diagnosis not present

## 2015-07-19 DIAGNOSIS — R7989 Other specified abnormal findings of blood chemistry: Secondary | ICD-10-CM | POA: Diagnosis present

## 2015-07-19 DIAGNOSIS — R778 Other specified abnormalities of plasma proteins: Secondary | ICD-10-CM | POA: Diagnosis present

## 2015-07-19 DIAGNOSIS — R0602 Shortness of breath: Secondary | ICD-10-CM | POA: Diagnosis not present

## 2015-07-19 DIAGNOSIS — N182 Chronic kidney disease, stage 2 (mild): Secondary | ICD-10-CM | POA: Diagnosis present

## 2015-07-19 DIAGNOSIS — Z66 Do not resuscitate: Secondary | ICD-10-CM | POA: Diagnosis present

## 2015-07-19 DIAGNOSIS — Z87891 Personal history of nicotine dependence: Secondary | ICD-10-CM

## 2015-07-19 DIAGNOSIS — I639 Cerebral infarction, unspecified: Secondary | ICD-10-CM | POA: Diagnosis present

## 2015-07-19 DIAGNOSIS — E119 Type 2 diabetes mellitus without complications: Secondary | ICD-10-CM

## 2015-07-19 DIAGNOSIS — I129 Hypertensive chronic kidney disease with stage 1 through stage 4 chronic kidney disease, or unspecified chronic kidney disease: Secondary | ICD-10-CM | POA: Diagnosis present

## 2015-07-19 DIAGNOSIS — N189 Chronic kidney disease, unspecified: Secondary | ICD-10-CM

## 2015-07-19 DIAGNOSIS — Z8546 Personal history of malignant neoplasm of prostate: Secondary | ICD-10-CM

## 2015-07-19 DIAGNOSIS — E669 Obesity, unspecified: Secondary | ICD-10-CM | POA: Diagnosis present

## 2015-07-19 DIAGNOSIS — I159 Secondary hypertension, unspecified: Secondary | ICD-10-CM

## 2015-07-19 DIAGNOSIS — Z9114 Patient's other noncompliance with medication regimen: Secondary | ICD-10-CM

## 2015-07-19 DIAGNOSIS — E785 Hyperlipidemia, unspecified: Secondary | ICD-10-CM | POA: Diagnosis present

## 2015-07-19 LAB — COMPREHENSIVE METABOLIC PANEL
ALBUMIN: 4 g/dL (ref 3.5–5.0)
ALK PHOS: 62 U/L (ref 38–126)
ALT: 47 U/L (ref 17–63)
AST: 63 U/L — ABNORMAL HIGH (ref 15–41)
Anion gap: 13 (ref 5–15)
BILIRUBIN TOTAL: 0.6 mg/dL (ref 0.3–1.2)
BUN: 22 mg/dL — AB (ref 6–20)
CALCIUM: 8.7 mg/dL — AB (ref 8.9–10.3)
CO2: 23 mmol/L (ref 22–32)
CREATININE: 1.41 mg/dL — AB (ref 0.61–1.24)
Chloride: 107 mmol/L (ref 101–111)
GFR calc Af Amer: 54 mL/min — ABNORMAL LOW (ref >60)
GFR calc non Af Amer: 46 mL/min — ABNORMAL LOW (ref >60)
GLUCOSE: 155 mg/dL — AB (ref 65–99)
Potassium: 4 mmol/L (ref 3.5–5.1)
SODIUM: 143 mmol/L (ref 135–145)
Total Protein: 7.4 g/dL (ref 6.5–8.1)

## 2015-07-19 LAB — CBC
HEMATOCRIT: 42.5 % (ref 39.0–52.0)
HEMOGLOBIN: 13.6 g/dL (ref 13.0–17.0)
MCH: 28.7 pg (ref 26.0–34.0)
MCHC: 32 g/dL (ref 30.0–36.0)
MCV: 89.7 fL (ref 78.0–100.0)
Platelets: 172 10*3/uL (ref 150–400)
RBC: 4.74 MIL/uL (ref 4.22–5.81)
RDW: 13.6 % (ref 11.5–15.5)
WBC: 7.6 10*3/uL (ref 4.0–10.5)

## 2015-07-19 LAB — CBG MONITORING, ED
Glucose-Capillary: 154 mg/dL — ABNORMAL HIGH (ref 65–99)
Glucose-Capillary: 149 mg/dL — ABNORMAL HIGH (ref 65–99)

## 2015-07-19 LAB — TROPONIN I: Troponin I: 0.05 ng/mL — ABNORMAL HIGH (ref <0.031)

## 2015-07-19 MED ORDER — LISINOPRIL 20 MG PO TABS
20.0000 mg | ORAL_TABLET | Freq: Two times a day (BID) | ORAL | Status: DC
  Administered 2015-07-19: 20 mg via ORAL
  Filled 2015-07-19 (×3): qty 1

## 2015-07-19 NOTE — ED Provider Notes (Signed)
CSN: [id number]     Arrival date & time 07/19/15  2108 History   First MD Initiated Contact with Patient 07/19/15 2144     Chief Complaint  Patient presents with  . Hypertension  . Fatigue     (Consider location/radiation/quality/duration/timing/severity/associated sxs/prior Treatment) HPI Comments: Patient presents to the emergency department with chief complaint of hypertension. States that he has been noncompliant in taking his blood pressure medicine. States that he has felt fatigued for the past week, and decided take his blood pressure today. It was 204/124. He decided to come to the emergency department because of this. He denies any chest pain, nausea, vomiting, diarrhea, constipation. Denies any headache, or vision changes. He states that he has had a cough and has had some shortness of breath. He attributes this to a recent URI. He denies any productive cough. States that he only becomes short of breath when he is doing exertional activity. Denies any associated chest pain radiating symptoms.  The history is provided by the patient. No language interpreter was used.    Past Medical History  Diagnosis Date  . Hypertension   . Ulcer     peptic  . Chronic kidney disease     nephrolithiasis right kidney  . Use of leuprolide acetate (Lupron) 06/2005-11/2006    02/2008 degarelix 02/2008 x 3 doses  . Prostate cancer ([place]) 12/09/03 dx    Prostate ca/adenocarcinoma,gleason=3+3=6    pSA 4.6  . Gynecomastia 04/21/11    seen by Dr.Murray, no rad tx  . Hydroureteronephrosis     right   . Osteopenia   . Cancer [facility]) 2006    L-2 solitary metastatic lesion/no rad tx ,asymptomatic  . Abnormal PSA 01/17/2012    10.38/4/5/ PSA:2013=6.96/ PSA 07/08/2011=6.26/ PSA 2006=8.6  . Blood transfusion without reported diagnosis     during vagotomy 1976  . Sleep apnea     pt unaware  . History of echocardiogram     echo 4/16:  mild LVH, EF 55-60%, Gr 1 DD, no RWMA, mild AI, mild MR, mod LAE, mild TR  .  Hx of cardiovascular stress test     Myoview 5/16:  EF 50%, no scar or ischemia, low risk  . Overweight   . Stroke [facility])     asymptomatic, found on head imaging   Past Surgical History  Procedure Laterality Date  . Prostate biopsy  12/09/03    adenocarcinoma,glerason: 3+3=6  . Vagotomy  1978    partial   Family History  Problem Relation Age of Onset  . Colon cancer Neg Hx   . Stroke Neg Hx    Social History  Substance Use Topics  . Smoking status: Former Smoker    Types: Pipe    Quit date: 07/05/1984  . Smokeless tobacco: Never Used  . Alcohol Use: No    Review of Systems  Constitutional: Negative for fever and chills.  Respiratory: Positive for cough and shortness of breath.   Cardiovascular: Negative for chest pain.  Gastrointestinal: Negative for nausea, vomiting, diarrhea and constipation.  Genitourinary: Negative for dysuria.  All other systems reviewed and are negative.     Allergies  Review of patient's allergies indicates no known allergies.  Home Medications   Prior to Admission medications   Medication Sig Start Date End Date Taking? Authorizing Provider  alendronate (FOSAMAX) 70 MG tablet Take 70 mg by mouth once a week. 10/01/14   Historical Provider, MD  aspirin 81 MG chewable tablet Chew 81 mg by mouth every  3 (three) days.    Historical Provider, MD  Calcium-Vitamin D (CALTRATE 600 PLUS-VIT D PO) Take 1 tablet by mouth daily.     Historical Provider, MD  lisinopril (PRINIVIL,ZESTRIL) 20 MG tablet Take 1 tablet (20 mg total) by mouth 2 (two) times daily. 10/22/14   Belkys A Regalado, MD  simvastatin (ZOCOR) 20 MG tablet Take 20 mg by mouth daily. 09/30/14   Historical Provider, MD   BP 170/101 mmHg  Pulse 81  Temp(Src) 98.7 F (37.1 C) (Oral)  Resp 18  SpO2 94% Physical Exam  Constitutional: He is oriented to person, place, and time. He appears well-developed and well-nourished.  HENT:  Head: Normocephalic and atraumatic.  Eyes: Conjunctivae and  EOM are normal. Pupils are equal, round, and reactive to light. Right eye exhibits no discharge. Left eye exhibits no discharge. No scleral icterus.  Neck: Normal range of motion. Neck supple. No JVD present.  Cardiovascular: Normal rate, regular rhythm and normal heart sounds.  Exam reveals no gallop and no friction rub.   No murmur heard. Pulmonary/Chest: Effort normal and breath sounds normal. No respiratory distress. He has no wheezes. He has no rales. He exhibits no tenderness.  Abdominal: Soft. He exhibits no distension and no mass. There is no tenderness. There is no rebound and no guarding.  Musculoskeletal: Normal range of motion. He exhibits no edema or tenderness.  Neurological: He is alert and oriented to person, place, and time.  Skin: Skin is warm and dry.  Psychiatric: He has a normal mood and affect. His behavior is normal. Judgment and thought content normal.  Nursing note and vitals reviewed.   ED Course  Procedures (including critical care time) Labs Review Labs Reviewed  CBG MONITORING, ED - Abnormal; Notable for the following:    Glucose-Capillary 154 (*)    All other components within normal limits  CBC  COMPREHENSIVE METABOLIC PANEL  TROPONIN I  CBG MONITORING, ED    Imaging Review Dg Chest 2 View  07/19/2015  CLINICAL DATA:  Fatigue, shortness of breath with exertion, difficulty sleeping, elevated blood pressure, former smoker EXAM: CHEST  2 VIEW COMPARISON:  10/20/2014 FINDINGS: Enlargement of cardiac silhouette. Tortuous aorta. Mediastinal contours and pulmonary vascularity otherwise normal. Minimal chronic peribronchial thickening. No pulmonary infiltrate, pleural effusion or pneumothorax. Bones unremarkable. IMPRESSION: Enlargement of cardiac silhouette with tortuous thoracic aorta. No acute abnormalities. Electronically Signed   By: Lavonia Dana M.D.   On: 07/19/2015 22:15   I have personally reviewed and evaluated these images and lab results as part of my  medical decision-making.   EKG Interpretation   Date/Time:  Saturday July 19 2015 22:26:59 EST Ventricular Rate:  79 PR Interval:  168 QRS Duration: 154 QT Interval:  403 QTC Calculation: 462 R Axis:   -110 Text Interpretation:  Sinus rhythm Multiple ventricular premature  complexes RBBB and LAFB Confirmed by Jeneen Rinks  MD, [place] ([id number]) on 07/19/2015  11:24:26 PM      MDM   Final diagnoses:  Secondary hypertension, unspecified     Patient seen by and discussed with Dr. Jeneen Rinks, who recommends delta troponin at 3 hours.  Doubt ACS, patient doesn't have chest pain.  Plan for discharge unless troponin is trending up.  1:16 AM Patient signed out to Annamary Carolin, who will continue care.  Plan for DC if trop is less than or equal to prior.   Montine Circle, PA-C 07/20/15 [place], MD 07/24/15 [phone]

## 2015-07-19 NOTE — ED Provider Notes (Signed)
Pt seen and evaluated.  He reports being off of BP medication, and is concerned about his BP.  Denies chest pain. 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 His been doing his normal activities of daily living there. Denies any chest pain. Denies any exertional pain. When asked about shortness of breath he states at times he will get "restless" meaning that it if he sitting he feels like he needs to get up and around and if he is up around he feels like he needs to sit. He had injured his leg in December and has had some persistent swelling but had a negative Doppler last week. No history of documented heart disease. He had a normal Myoview scan in April during hospital admission for rule out.  EKG shows right bundle-branch block and PVCs. Does not show any changes suggestive of ischemia. Initial troponin 0.05. He remains asymptomatic. Creatinine is elevated, but not above his previous baseline.  If his repeat troponin remains the same would not be concerned about this being myocardial ischemia taking the appropriate for discharge. Elevating would recommend admission for rule out. He will he neither case be restarted on his blood pressure medication and asked to be compliant.  Tanna Furry, MD 07/19/15 2337

## 2015-07-19 NOTE — ED Notes (Signed)
Pt c/o fatigue, shortness of breath with exertion, difficulty sleeping and elevated BP for the past week. States he is gradually feeling worse. Denies chest pain, nausea, headache, blurred vision. Pt had BP reading of 204/124 on home BP monitor today. States he is taking BP meds daily, but he doesn't remember the name of medication. Pt alert, no acute distress. Skin warm, dry.

## 2015-07-19 NOTE — ED Notes (Signed)
PA at bedside.

## 2015-07-20 ENCOUNTER — Other Ambulatory Visit (HOSPITAL_COMMUNITY): Payer: Medicare Other

## 2015-07-20 DIAGNOSIS — I639 Cerebral infarction, unspecified: Secondary | ICD-10-CM | POA: Diagnosis present

## 2015-07-20 DIAGNOSIS — R0602 Shortness of breath: Secondary | ICD-10-CM | POA: Diagnosis present

## 2015-07-20 DIAGNOSIS — Z87891 Personal history of nicotine dependence: Secondary | ICD-10-CM | POA: Diagnosis not present

## 2015-07-20 DIAGNOSIS — E785 Hyperlipidemia, unspecified: Secondary | ICD-10-CM | POA: Diagnosis present

## 2015-07-20 DIAGNOSIS — Z9114 Patient's other noncompliance with medication regimen: Secondary | ICD-10-CM | POA: Diagnosis not present

## 2015-07-20 DIAGNOSIS — R7989 Other specified abnormal findings of blood chemistry: Secondary | ICD-10-CM | POA: Diagnosis present

## 2015-07-20 DIAGNOSIS — I160 Hypertensive urgency: Principal | ICD-10-CM

## 2015-07-20 DIAGNOSIS — E669 Obesity, unspecified: Secondary | ICD-10-CM | POA: Diagnosis present

## 2015-07-20 DIAGNOSIS — C61 Malignant neoplasm of prostate: Secondary | ICD-10-CM

## 2015-07-20 DIAGNOSIS — E119 Type 2 diabetes mellitus without complications: Secondary | ICD-10-CM | POA: Diagnosis present

## 2015-07-20 DIAGNOSIS — Z8546 Personal history of malignant neoplasm of prostate: Secondary | ICD-10-CM | POA: Diagnosis not present

## 2015-07-20 DIAGNOSIS — I129 Hypertensive chronic kidney disease with stage 1 through stage 4 chronic kidney disease, or unspecified chronic kidney disease: Secondary | ICD-10-CM | POA: Diagnosis present

## 2015-07-20 DIAGNOSIS — N182 Chronic kidney disease, stage 2 (mild): Secondary | ICD-10-CM | POA: Diagnosis present

## 2015-07-20 DIAGNOSIS — Z6831 Body mass index (BMI) 31.0-31.9, adult: Secondary | ICD-10-CM | POA: Diagnosis not present

## 2015-07-20 DIAGNOSIS — N179 Acute kidney failure, unspecified: Secondary | ICD-10-CM | POA: Diagnosis present

## 2015-07-20 DIAGNOSIS — Z66 Do not resuscitate: Secondary | ICD-10-CM | POA: Diagnosis present

## 2015-07-20 DIAGNOSIS — N189 Chronic kidney disease, unspecified: Secondary | ICD-10-CM

## 2015-07-20 DIAGNOSIS — I4510 Unspecified right bundle-branch block: Secondary | ICD-10-CM | POA: Diagnosis present

## 2015-07-20 DIAGNOSIS — Z8673 Personal history of transient ischemic attack (TIA), and cerebral infarction without residual deficits: Secondary | ICD-10-CM | POA: Diagnosis not present

## 2015-07-20 DIAGNOSIS — R778 Other specified abnormalities of plasma proteins: Secondary | ICD-10-CM | POA: Diagnosis present

## 2015-07-20 LAB — LIPID PANEL
CHOLESTEROL: 118 mg/dL (ref 0–200)
HDL: 35 mg/dL — ABNORMAL LOW (ref >40)
LDL Cholesterol: 66 mg/dL (ref 0–99)
TRIGLYCERIDES: 86 mg/dL (ref <150)
Total CHOL/HDL Ratio: 3.4 RATIO
VLDL: 17 mg/dL (ref 0–40)

## 2015-07-20 LAB — BASIC METABOLIC PANEL
Anion gap: 13 (ref 5–15)
BUN: 20 mg/dL (ref 6–20)
CALCIUM: 8.6 mg/dL — AB (ref 8.9–10.3)
CO2: 25 mmol/L (ref 22–32)
CREATININE: 1.05 mg/dL (ref 0.61–1.24)
Chloride: 104 mmol/L (ref 101–111)
GFR calc Af Amer: >60 mL/min (ref >60)
Glucose, Bld: 128 mg/dL — ABNORMAL HIGH (ref 65–99)
POTASSIUM: 3.8 mmol/L (ref 3.5–5.1)
SODIUM: 142 mmol/L (ref 135–145)

## 2015-07-20 LAB — RAPID URINE DRUG SCREEN, HOSP PERFORMED
AMPHETAMINES: NOT DETECTED
BARBITURATES: NOT DETECTED
BENZODIAZEPINES: NOT DETECTED
Cocaine: NOT DETECTED
Opiates: NOT DETECTED
TETRAHYDROCANNABINOL: NOT DETECTED

## 2015-07-20 LAB — CBC
HEMATOCRIT: 41.1 % (ref 39.0–52.0)
Hemoglobin: 12.9 g/dL — ABNORMAL LOW (ref 13.0–17.0)
MCH: 28.8 pg (ref 26.0–34.0)
MCHC: 31.4 g/dL (ref 30.0–36.0)
MCV: 91.7 fL (ref 78.0–100.0)
PLATELETS: 169 10*3/uL (ref 150–400)
RBC: 4.48 MIL/uL (ref 4.22–5.81)
RDW: 13.9 % (ref 11.5–15.5)
WBC: 6.5 10*3/uL (ref 4.0–10.5)

## 2015-07-20 LAB — PROTIME-INR
INR: 1.17 (ref 0.00–1.49)
Prothrombin Time: 15.1 seconds (ref 11.6–15.2)

## 2015-07-20 LAB — GLUCOSE, CAPILLARY
GLUCOSE-CAPILLARY: 122 mg/dL — AB (ref 65–99)
GLUCOSE-CAPILLARY: 127 mg/dL — AB (ref 65–99)

## 2015-07-20 LAB — APTT: APTT: 36 s (ref 24–37)

## 2015-07-20 LAB — TROPONIN I
TROPONIN I: 0.03 ng/mL (ref <0.031)
TROPONIN I: 0.06 ng/mL — AB (ref <0.031)
Troponin I: 0.06 ng/mL — ABNORMAL HIGH (ref <0.031)

## 2015-07-20 LAB — SODIUM, URINE, RANDOM: SODIUM UR: 139 mmol/L

## 2015-07-20 LAB — BRAIN NATRIURETIC PEPTIDE: B NATRIURETIC PEPTIDE 5: 235.7 pg/mL — AB (ref 0.0–100.0)

## 2015-07-20 LAB — CREATININE, URINE, RANDOM: CREATININE, URINE: 127.32 mg/dL

## 2015-07-20 MED ORDER — AMLODIPINE BESYLATE 10 MG PO TABS: 10.0000 mg | ORAL_TABLET | Freq: Every day | ORAL | Status: DC

## 2015-07-20 MED ORDER — SIMVASTATIN 20 MG PO TABS: 20.0000 mg | ORAL_TABLET | Freq: Every day | ORAL | Status: DC

## 2015-07-20 MED ORDER — SODIUM CHLORIDE 0.9 % IJ SOLN
3.0000 mL | Freq: Two times a day (BID) | INTRAMUSCULAR | Status: DC
  Administered 2015-07-20 (×2): 3 mL via INTRAVENOUS

## 2015-07-20 MED ORDER — HYDRALAZINE HCL 10 MG PO TABS
10.0000 mg | ORAL_TABLET | Freq: Three times a day (TID) | ORAL | Status: DC
  Administered 2015-07-20 (×2): 10 mg via ORAL
  Filled 2015-07-20 (×2): qty 1

## 2015-07-20 MED ORDER — NITROGLYCERIN 0.4 MG SL SUBL: 0.4000 mg | SUBLINGUAL_TABLET | SUBLINGUAL | Status: DC | PRN

## 2015-07-20 MED ORDER — LISINOPRIL 10 MG PO TABS: 10.0000 mg | ORAL_TABLET | Freq: Every day | ORAL | Status: DC

## 2015-07-20 MED ORDER — ACETAMINOPHEN 325 MG PO TABS: 650.0000 mg | ORAL_TABLET | Freq: Four times a day (QID) | ORAL | Status: DC | PRN

## 2015-07-20 MED ORDER — ASPIRIN EC 81 MG PO TBEC
81.0000 mg | DELAYED_RELEASE_TABLET | Freq: Every day | ORAL | Status: DC
  Filled 2015-07-20: qty 1

## 2015-07-20 MED ORDER — HYDRALAZINE HCL 20 MG/ML IJ SOLN: 5.0000 mg | INTRAMUSCULAR | Status: DC | PRN

## 2015-07-20 MED ORDER — INSULIN ASPART 100 UNIT/ML ~~LOC~~ SOLN
0.0000 [IU] | Freq: Three times a day (TID) | SUBCUTANEOUS | Status: DC
  Administered 2015-07-20 (×2): 1 [IU] via SUBCUTANEOUS

## 2015-07-20 MED ORDER — SODIUM CHLORIDE 0.9 % IV SOLN
INTRAVENOUS | Status: DC
  Administered 2015-07-20: 05:00:00 via INTRAVENOUS

## 2015-07-20 MED ORDER — ACETAMINOPHEN 650 MG RE SUPP: 650.0000 mg | Freq: Four times a day (QID) | RECTAL | Status: DC | PRN

## 2015-07-20 MED ORDER — HEPARIN SODIUM (PORCINE) 5000 UNIT/ML IJ SOLN
5000.0000 [IU] | Freq: Three times a day (TID) | INTRAMUSCULAR | Status: DC
  Administered 2015-07-20 (×2): 5000 [IU] via SUBCUTANEOUS
  Filled 2015-07-20 (×2): qty 1

## 2015-07-20 MED ORDER — AMLODIPINE BESYLATE 10 MG PO TABS
10.0000 mg | ORAL_TABLET | Freq: Every day | ORAL | Status: DC
  Administered 2015-07-20: 10 mg via ORAL
  Filled 2015-07-20: qty 1

## 2015-07-20 MED ORDER — PANTOPRAZOLE SODIUM 40 MG PO TBEC
40.0000 mg | DELAYED_RELEASE_TABLET | Freq: Every day | ORAL | Status: DC
  Administered 2015-07-20: 40 mg via ORAL
  Filled 2015-07-20 (×2): qty 1

## 2015-07-20 NOTE — Discharge Summary (Signed)
Physician Discharge Summary  [name] [id number] DOB: 03/28/1935 DOA: 07/19/2015  PCP: Precious Reel, MD  Admit date: 07/19/2015 Discharge date: 07/20/2015  Recommendations for Outpatient Follow-up:  1. Pt will need to follow up with PCP in 2-3 weeks post discharge 2. Please obtain BMP to evaluate electrolytes and kidney function  Discharge Diagnoses:  Principal Problem:   Hypertensive urgency Active Problems:   Hx of Prostate cancer   RBBB   Acute on chronic kidney failure (HCC)   HLD (hyperlipidemia)   Stroke (cerebrum) (HCC)   Elevated troponin   Diabetes mellitus without complication ([place])    Discharge Condition: Stable  Diet recommendation: Heart healthy diet discussed in details   History of present illness:  81 y.o. male with PMH of hypertension, hyperlipidemia, CKD-2, prostate cancer (last PET scan 2013 within normal limits), stroke, obesity, right bundle blockade, upper GI bleeding (s/p Vagotomy 1975), medication noncompliance, who presented with shortness of breath and elevated blood pressure.  Patient reports that he has not taken his blood pressure medications for long time. He developed shortness of breath about one-week PTA.   In ED, patient was found to have elevated blood pressure at 186/117, positive troponin 0.05, worsening renal function. Chest x-ray showed enlargement of cardiac silhouette. TRH asked to admit for further evaluation.   Hospital Course:  Principal Problem:   Hypertensive urgency - BP better this AM - pt wants to go home - d/c on Amlodipine and Lisinopril with recommendation for outpatient follow up as soon as possible   Active Problems:   Hx of Prostate cancer - stable for now    Acute on chronic kidney failure, stage II ([place]) - resolved with IVF and BP control  - Cr is WNL this AM    Elevated troponin - from HTN-ive urgency  - stabilized with BP control  - Body mass index is 31.76 kg/(m^2).    Obesity - Body mass index  is 31.76 kg/(m^2).  Procedures/Studies: Dg Chest 2 View  07/19/2015  CLINICAL DATA:  Fatigue, shortness of breath with exertion, difficulty sleeping, elevated blood pressure, former smoker EXAM: CHEST  2 VIEW COMPARISON:  10/20/2014 FINDINGS: Enlargement of cardiac silhouette. Tortuous aorta. Mediastinal contours and pulmonary vascularity otherwise normal. Minimal chronic peribronchial thickening. No pulmonary infiltrate, pleural effusion or pneumothorax. Bones unremarkable. IMPRESSION: Enlargement of cardiac silhouette with tortuous thoracic aorta. No acute abnormalities. Electronically Signed   By: Lavonia Dana M.D.   On: 07/19/2015 22:15   Discharge Exam: Filed Vitals:   07/20/15 0829 07/20/15 1420  BP: 160/82 130/68  Pulse:  73  Temp:  97.7 F (36.5 C)  Resp:  20   Filed Vitals:   07/20/15 0330 07/20/15 0640 07/20/15 0829 07/20/15 1420  BP:  158/86 160/82 130/68  Pulse:  73  73  Temp:  98.2 F (36.8 C)  97.7 F (36.5 C)  TempSrc:  Oral  Oral  Resp:  18  20  Height: 6\' 1"  (1.854 m)     Weight: 109.181 kg (240 lb 11.2 oz)     SpO2:  95%  96%    General: Pt is alert, follows commands appropriately, not in acute distress Cardiovascular: Regular rate and rhythm, S1/S2 +, no rubs, no gallops Respiratory: Clear to auscultation bilaterally, no wheezing, no crackles, no rhonchi Abdominal: Soft, non tender, non distended, bowel sounds +, no guarding Extremities: no edema, no cyanosis, pulses palpable bilaterally DP and PT Neuro: Grossly nonfocal  Discharge Instructions  Discharge Instructions    Diet - low  sodium heart healthy    Complete by:  As directed      Increase activity slowly    Complete by:  As directed             Medication List    TAKE these medications        amLODipine 10 MG tablet  Commonly known as:  NORVASC  Take 1 tablet (10 mg total) by mouth daily.     CALTRATE 600 PLUS-VIT D PO  Take 1 tablet by mouth daily.     lisinopril 10 MG tablet   Commonly known as:  PRINIVIL,ZESTRIL  Take 1 tablet (10 mg total) by mouth daily.     simvastatin 20 MG tablet  Commonly known as:  ZOCOR  Take 20 mg by mouth daily.            Follow-up Information    Follow up with Precious Reel, MD In 3 days.   Specialty:  Internal Medicine   Contact information:   [address] [place] [address] [phone]       Follow up with [facility] In 3 days.   Specialty:  Cardiology   Contact information:   [address], [place] [phone]      Follow up with Precious Reel, MD.   Specialty:  Internal Medicine   Contact information:   [address] [place] [address] [phone]       Call Faye Ramsay, MD.   Specialty:  Internal Medicine   Why:  As needed call my cell phone [phone]   Contact information:   [address] [place] [address] [phone]        The results of significant diagnostics from this hospitalization (including imaging, microbiology, ancillary and laboratory) are listed below for reference.     Microbiology: No results found for this or any previous visit (from the past 240 hour(s)).   Labs: Basic Metabolic Panel:  Recent Labs Lab 07/19/15 2148 07/20/15 0529  NA 143 142  K 4.0 3.8  CL 107 104  CO2 23 25  GLUCOSE 155* 128*  BUN 22* 20  CREATININE 1.41* 1.05  CALCIUM 8.7* 8.6*   Liver Function Tests:  Recent Labs Lab 07/19/15 2148  AST 63*  ALT 47  ALKPHOS 62  BILITOT 0.6  PROT 7.4  ALBUMIN 4.0   No results for input(s): LIPASE, AMYLASE in the last 168 hours. No results for input(s): AMMONIA in the last 168 hours. CBC:  Recent Labs Lab 07/19/15 2148 07/20/15 0529  WBC 7.6 6.5  HGB 13.6 12.9*  HCT 42.5 41.1  MCV 89.7 91.7  PLT 172 169   Cardiac Enzymes:  Recent Labs Lab 07/19/15 2148 07/20/15 0020 07/20/15 0529 07/20/15 1127  TROPONINI 0.05* 0.06* 0.06* 0.03    BNP: BNP (last 3 results)  Recent Labs  10/20/14 1314 07/20/15 0529  BNP 78.1 235.7*    ProBNP (last 3 results) No results for input(s): PROBNP in the last 8760 hours.  CBG:  Recent Labs Lab 07/19/15 2151 07/19/15 2231 07/20/15 0749 07/20/15 1138  GLUCAP 154* 149* 122* 127*     SIGNED: Time coordinating discharge: 30 minutes  MAGICK-Euell Schiff, MD  [facility] 07/20/2015, 3:45 PM Pager [phone]  If 7PM-7AM, please contact night-coverage [email or web address] Password [email or web address]

## 2015-07-20 NOTE — ED Provider Notes (Signed)
Patient signout from [employer] PA-C. The patient came in today because of fatigue for the past week and uncontrolled hypertension. He is not compliant with his blood pressure medicines and has not taken them very long time. On arrival his blood pressure was elevated which was resolved with medications here in the emergency department. The patient was seen by Dr. Jeneen Rinks supervising physician who do not feel like this was ACS related. His initial troponin was 0.05, and therefore a delta troponin was ordered.  The repeat troponin is slightly more elevated at 0.06. It is Dr. Jeneen Rinks recommendation that the patient be admitted for cardiac rule out. The patient has not had any chest pain and currently denies chest pain or any symptoms. He is agreeable to admission.  Obs, tele, [facility] admits, [facility]  Delos Haring, PA-C 07/20/15 0211  Leo Grosser, MD 07/20/15 [phone]

## 2015-07-20 NOTE — H&P (Signed)
[facility] History and Physical  Mindy Verzosa [id number] DOB: 06-Mar-1935 DOA: 07/19/2015  Referring physician: ED physician PCP: Precious Reel, MD  Specialists:   Chief Complaint: Shortness of breath and elevated blood pressure  HPI: [name] is a 81 y.o. male with PMH of hypertension, hyperlipidemia, CKD-2, prostate cancer (last PET scan 2013 within normal limits), stroke, obesity, right bundle blockade, upper GI bleeding (s/p Vagotomy 1975), medication noncompliance, who presents with shortness of breath and elevated blood pressure.  Patient reports that he has not taken his blood pressure medications for long time. He developed shortness of breath about one-week ago. His blood pressure is elevated to 204/124 at 1 time point at home. He strongly denies chest pain. No cough, fever or chills. He does not have abdominal pain, diarrhea, symptoms of UTI or unilateral weakness. He reports that he had right lower leg injury recently, causing R leg swelling. He had LE venous Doppler by Dr. Burt Knack one week ago, which is negative for DVT.  In ED, patient was found to have elevated blood pressure at 186/117, positive troponin 0.05, worsening renal function. Chest x-ray showed enlargement of cardiac silhouette with tortuous thoracic aorta. Patient submitted 3 inpatient for further eval and treatment.  EKG: Independently reviewed. QTC 462, old right bundle blockade, bifascicular block, T-wave inversion in inferior leads and precordial leads.  Where does patient live?   At home    Can patient participate in ADLs?  Some   Review of Systems:   General: no fevers, chills, no changes in body weight, has poor appetite, has fatigue HEENT: no blurry vision, hearing changes or sore throat Pulm: has dyspnea, no coughing, wheezing CV: no chest pain, palpitations Abd: no nausea, vomiting, abdominal pain, diarrhea, constipation GU: no dysuria, burning on urination, increased urinary frequency,  hematuria  Ext: has R lower leg edema. Neuro: no unilateral weakness, numbness, or tingling, no vision change or hearing loss Skin: no rash MSK: No muscle spasm, no deformity, no limitation of range of movement in spin Heme: No easy bruising.  Travel history: No recent long distant travel.  Allergy: No Known Allergies  Past Medical History  Diagnosis Date  . Hypertension   . Ulcer     peptic  . Chronic kidney disease     nephrolithiasis right kidney  . Use of leuprolide acetate (Lupron) 06/2005-11/2006    02/2008 degarelix 02/2008 x 3 doses  . Prostate cancer ([place]) 12/09/03 dx    Prostate ca/adenocarcinoma,gleason=3+3=6    pSA 4.6  . Gynecomastia 04/21/11    seen by Dr.Murray, no rad tx  . Hydroureteronephrosis     right   . Osteopenia   . Cancer Revision [facility]) 2006    L-2 solitary metastatic lesion/no rad tx ,asymptomatic  . Abnormal PSA 01/17/2012    10.38/4/5/ PSA:2013=6.96/ PSA 07/08/2011=6.26/ PSA 2006=8.6  . Blood transfusion without reported diagnosis     during vagotomy 1976  . Sleep apnea     pt unaware  . History of echocardiogram     echo 4/16:  mild LVH, EF 55-60%, Gr 1 DD, no RWMA, mild AI, mild MR, mod LAE, mild TR  . Hx of cardiovascular stress test     Myoview 5/16:  EF 50%, no scar or ischemia, low risk  . Overweight   . Stroke [facility])     asymptomatic, found on head imaging    Past Surgical History  Procedure Laterality Date  . Prostate biopsy  12/09/03    adenocarcinoma,glerason: 3+3=6  . Vagotomy  1978    partial    Social History:  reports that he quit smoking about 31 years ago. His smoking use included Pipe. He has never used smokeless tobacco. He reports that he does not drink alcohol or use illicit drugs.  Family History:  Family History  Problem Relation Age of Onset  . Colon cancer Neg Hx   . Stroke Neg Hx      Prior to Admission medications   Medication Sig Start Date End Date Taking? Authorizing Provider  Calcium-Vitamin D (CALTRATE 600 PLUS-VIT D  PO) Take 1 tablet by mouth daily.    Yes Historical Provider, MD  simvastatin (ZOCOR) 20 MG tablet Take 20 mg by mouth daily. 09/30/14  Yes Historical Provider, MD  lisinopril (PRINIVIL,ZESTRIL) 10 MG tablet Take 1 tablet (10 mg total) by mouth daily. 07/20/15   Montine Circle, PA-C    Physical Exam: Filed Vitals:   07/20/15 0230 07/20/15 0300 07/20/15 0305 07/20/15 0330  BP: 140/94 145/100    Pulse:   71   Temp:      TempSrc:      Resp: 18 17    Height:    6\' 1"  (1.854 m)  Weight:    109.181 kg (240 lb 11.2 oz)  SpO2:   98%    General: Not in acute distress HEENT:       Eyes: PERRL, EOMI, no scleral icterus.       ENT: No discharge from the ears and nose, no pharynx injection, no tonsillar enlargement.        Neck: No JVD, no bruit, no mass felt. Heme: No neck lymph node enlargement. Cardiac: S1/S2, RRR, No murmurs, No gallops or rubs. Pulm: No rales, wheezing, rhonchi or rubs. Abd: Soft, nondistended, nontender, no rebound pain, no organomegaly, BS present. Ext: 1+ pitting right leg edema bilaterally. 2+DP/PT pulse bilaterally. Musculoskeletal: No joint deformities, No joint redness or warmth, no limitation of ROM in spin. Skin: No rashes.  Neuro: Alert, oriented X3, cranial nerves II-XII grossly intact, moving all extremities normally. Psych: Patient is not psychotic, no suicidal or hemocidal ideation.  Labs on Admission:  Basic Metabolic Panel:  Recent Labs Lab 07/19/15 2148  NA 143  K 4.0  CL 107  CO2 23  GLUCOSE 155*  BUN 22*  CREATININE 1.41*  CALCIUM 8.7*   Liver Function Tests:  Recent Labs Lab 07/19/15 2148  AST 63*  ALT 47  ALKPHOS 62  BILITOT 0.6  PROT 7.4  ALBUMIN 4.0   No results for input(s): LIPASE, AMYLASE in the last 168 hours. No results for input(s): AMMONIA in the last 168 hours. CBC:  Recent Labs Lab 07/19/15 2148  WBC 7.6  HGB 13.6  HCT 42.5  MCV 89.7  PLT 172   Cardiac Enzymes:  Recent Labs Lab 07/19/15 2148  07/20/15 0020  TROPONINI 0.05* 0.06*    BNP (last 3 results)  Recent Labs  10/20/14 1314  BNP 78.1    ProBNP (last 3 results) No results for input(s): PROBNP in the last 8760 hours.  CBG:  Recent Labs Lab 07/19/15 2151 07/19/15 2231  GLUCAP 154* 149*    Radiological Exams on Admission: Dg Chest 2 View  07/19/2015  CLINICAL DATA:  Fatigue, shortness of breath with exertion, difficulty sleeping, elevated blood pressure, former smoker EXAM: CHEST  2 VIEW COMPARISON:  10/20/2014 FINDINGS: Enlargement of cardiac silhouette. Tortuous aorta. Mediastinal contours and pulmonary vascularity otherwise normal. Minimal chronic peribronchial thickening. No pulmonary infiltrate, pleural effusion or pneumothorax.  Bones unremarkable. IMPRESSION: Enlargement of cardiac silhouette with tortuous thoracic aorta. No acute abnormalities. Electronically Signed   By: Lavonia Dana M.D.   On: 07/19/2015 22:15    Assessment/Plan Principal Problem:   Hypertensive urgency Active Problems:   Hx of Prostate cancer   RBBB   Acute on chronic kidney failure (HCC)   HLD (hyperlipidemia)   Stroke (cerebrum) (HCC)   Elevated troponin   Diabetes mellitus without complication (HCC)   Hypertensive urgency: Patient presents with elevated blood pressure, which is likely the etiology for his shortness of breath. He may have a flush of pulmonary edema. He does not have any chest pain, less likely to have pulmonary embolism. No infiltration by chest x-ray.  -Admitted to telemetry bed -IV hydralazine when necessary -Start amlodipine 10 mg daily now -check BNP and UDS  Elevated trop: Troponin 0.05->0.06, no any chest pain. Most likely due to demanding ischemic secondary to hypertensive urgency. -Start aspirin 81 mg (patient had a history of upper GI bleeding), will start Protonix to prevent GIB -When necessary nitroglycerin -Continue Zocor -Troponin 3 next-repeat EKG morning -2-D echo -Check A1c and  FLP  AoCKD-II: Baseline Cre is 1.2, his Cre 1.41, BUN 22 is on admission. Likely due to hypertensive urgency and possible prerenal failure. - IVF: Normal saline 75 mL per hour - Check FeNa  - Follow up renal function by BMP - Avoid ACEI and NSAIDs  HLD: Last LDL was 70 and 10/21/14 -Continue home medications: Zocor -Check FLP  Diabetes mellitus without complication ([place]): 123456 7.1 on 10/21/14. Patient has not taken medications at home. -SSI.  Tortuous thoracic aorta: as shown by CXR. Given his significantly elevated blood pressure, aortic dissection is a potential differential diagnosis, however patient strongly denies any chest pain, less likely to have aortic dissection at this moment -will observe patient closely. If the patient developed any chest pain, particularly typical tearing chest pain, will need to get CT angiogram to rule out aortic dissection.    DVT ppx: SQ Heparin  Code Status: DNR Family Communication: None at bed side. Disposition Plan: Admit to inpatient   Date of Service 07/20/2015    Ivor Costa [facility] Pager [phone]  If 7PM-7AM, please contact night-coverage [email or web address] Password [email or web address] 07/20/2015, 5:01 AM

## 2015-07-20 NOTE — Evaluation (Signed)
Physical Therapy Evaluation-1x Patient Details Name: [name] MRN: [id number] DOB: 1934/11/23 Today's Date: 07/20/2015   History of Present Illness  81 yo male admitted with hypertensive urgency. Hx of HTN, CKD, CVA, obesity, RBBB, noncompliance, prostate cancer.   Clinical Impression  On eval, pt was Min guard assist for mobility-walked ~500 feet. Pt tolerated distance well. LOB x1 while descending steps. Instructed pt to slow down, use caution, use handrail for safety-pt agreeable. Do not feel pt has any follow up PT needs. Will sign off. Recommend daily ambulation with supervision for remainder of stay.      Follow Up Recommendations No PT follow up    Equipment Recommendations  None recommended by PT    Recommendations for Other Services       Precautions / Restrictions Precautions Precautions: Fall Restrictions Weight Bearing Restrictions: No      Mobility  Bed Mobility Overal bed mobility: Independent                Transfers Overall transfer level: Independent                  Ambulation/Gait Ambulation/Gait assistance: Modified independent (Device/Increase time) Ambulation Distance (Feet): 500 Feet Assistive device: None Gait Pattern/deviations: WFL(Within Functional Limits)        Stairs Stairs: Yes Stairs assistance: Min guard Stair Management: Alternating pattern;Forwards Number of Stairs: 9 General stair comments: close guard for safety. LOB while descending. Instructed pt to slow down, use caution, and to use handrail for safety.   Wheelchair Mobility    Modified Rankin (Stroke Patients Only)       Balance Overall balance assessment: Needs assistance             Standing balance comment: Had pt perform static standing: EO/EC, narrow BOS, withstanding of external perturbations; 360 degree turns, picking up object-all without difficulty.              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, international aid)       Home Layout: Multi-level (4 levels to home) Home Equipment: None      Prior Function Level of Independence: Independent               Hand Dominance        Extremity/Trunk Assessment   Upper Extremity Assessment: Overall WFL for tasks assessed           Lower Extremity Assessment: Overall WFL for tasks assessed      Cervical / Trunk Assessment: Normal  Communication   Communication: No difficulties  Cognition Arousal/Alertness: Awake/alert Behavior During Therapy: WFL for tasks assessed/performed Overall Cognitive Status: Within Functional Limits for tasks assessed                      General Comments      Exercises        Assessment/Plan    PT Assessment Patent does not need any further PT services  PT Diagnosis     PT Problem List    PT Treatment Interventions     PT Goals (Current goals can be found in the Care Plan section) Acute Rehab PT Goals Patient Stated Goal: home soon  PT Goal Formulation: All assessment and education complete, DC therapy    Frequency     Barriers to discharge        Co-evaluation               End of Session Equipment Utilized During Treatment: Gait belt Activity Tolerance: Patient tolerated treatment well Patient left: in chair;with call bell/phone within reach           Time: [id number] PT Time Calculation (min) (ACUTE ONLY): 11 min   Charges:   PT Evaluation $PT Eval Low Complexity: 1 Procedure     PT G Codes:        Weston Anna, MPT Pager: [phone]

## 2015-07-20 NOTE — Progress Notes (Signed)
Pt admitted after midnight, Please see Dr. Blaine Hamper note. Admitted with hypertensive urgency and SBP in 180. SBP in 150's this AM. Pt on Norvasc and hydralazine as needed. Will add low dose of Hydralazine 10mg  TID and will monitor clinical response. Please see full detailed H&P from Dr. Blaine Hamper.  Faye Ramsay, MD  [facility] Pager [phone]  If 7PM-7AM, please contact night-coverage [email or web address] Password [email or web address]

## 2015-07-20 NOTE — Discharge Instructions (Signed)
Please follow-up with your primary care doctor or your cardiologist in the next 3 days. You need to take your lisinopril on a daily basis. You'll need to have your kidney function rechecked in 1 week's time.   Hypertension Hypertension, commonly called high blood pressure, is when the force of blood pumping through your arteries is too strong. Your arteries are the blood vessels that carry blood from your heart throughout your body. A blood pressure reading consists of a higher number over a lower number, such as 110/72. The higher number (systolic) is the pressure inside your arteries when your heart pumps. The lower number (diastolic) is the pressure inside your arteries when your heart relaxes. Ideally you want your blood pressure below 120/80. Hypertension forces your heart to work harder to pump blood. Your arteries may become narrow or stiff. Having untreated or uncontrolled hypertension can cause heart attack, stroke, kidney disease, and other problems. RISK FACTORS Some risk factors for high blood pressure are controllable. Others are not.  Risk factors you cannot control include:   Race. You may be at higher risk if you are African American.  Age. Risk increases with age.  Gender. Men are at higher risk than women before age 31 years. After age 39, women are at higher risk than men. Risk factors you can control include:  Not getting enough exercise or physical activity.  Being overweight.  Getting too much fat, sugar, calories, or salt in your diet.  Drinking too much alcohol. SIGNS AND SYMPTOMS Hypertension does not usually cause signs or symptoms. Extremely high blood pressure (hypertensive crisis) may cause headache, anxiety, shortness of breath, and nosebleed. DIAGNOSIS To check if you have hypertension, your health care provider will measure your blood pressure while you are seated, with your arm held at the level of your heart. It should be measured at least twice using the  same arm. Certain conditions can cause a difference in blood pressure between your right and left arms. A blood pressure reading that is higher than normal on one occasion does not mean that you need treatment. If it is not clear whether you have high blood pressure, you may be asked to return on a different day to have your blood pressure checked again. Or, you may be asked to monitor your blood pressure at home for 1 or more weeks. TREATMENT Treating high blood pressure includes making lifestyle changes and possibly taking medicine. Living a healthy lifestyle can help lower high blood pressure. You may need to change some of your habits. Lifestyle changes may include:  Following the DASH diet. This diet is high in fruits, vegetables, and whole grains. It is low in salt, red meat, and added sugars.  Keep your sodium intake below 2,300 mg per day.  Getting at least 30-45 minutes of aerobic exercise at least 4 times per week.  Losing weight if necessary.  Not smoking.  Limiting alcoholic beverages.  Learning ways to reduce stress. Your health care provider may prescribe medicine if lifestyle changes are not enough to get your blood pressure under control, and if one of the following is true:  You are 84-68 years of age and your systolic blood pressure is above 140.  You are 47 years of age or older, and your systolic blood pressure is above 150.  Your diastolic blood pressure is above 90.  You have diabetes, and your systolic blood pressure is over [id number] or your diastolic blood pressure is over 90.  You have kidney disease  and your blood pressure is above 140/90.  You have heart disease and your blood pressure is above 140/90. Your personal target blood pressure may vary depending on your medical conditions, your age, and other factors. HOME CARE INSTRUCTIONS  Have your blood pressure rechecked as directed by your health care provider.   Take medicines only as directed by your  health care provider. Follow the directions carefully. Blood pressure medicines must be taken as prescribed. The medicine does not work as well when you skip doses. Skipping doses also puts you at risk for problems.  Do not smoke.   Monitor your blood pressure at home as directed by your health care provider. SEEK MEDICAL CARE IF:   You think you are having a reaction to medicines taken.  You have recurrent headaches or feel dizzy.  You have swelling in your ankles.  You have trouble with your vision. SEEK IMMEDIATE MEDICAL CARE IF:  You develop a severe headache or confusion.  You have unusual weakness, numbness, or feel faint.  You have severe chest or abdominal pain.  You vomit repeatedly.  You have trouble breathing. MAKE SURE YOU:   Understand these instructions.  Will watch your condition.  Will get help right away if you are not doing well or get worse.   This information is not intended to replace advice given to you by your health care provider. Make sure you discuss any questions you have with your health care provider.   Document Released: 06/21/2005 Document Revised: 11/05/2014 Document Reviewed: 04/13/2013 Elsevier Interactive Patient Education [employer].

## 2015-07-21 LAB — HEMOGLOBIN A1C
HEMOGLOBIN A1C: 6.8 % — AB (ref 4.8–5.6)
Mean Plasma Glucose: 148 mg/dL

## 2018-04-27 ENCOUNTER — Observation Stay (HOSPITAL_COMMUNITY)
Admission: EM | Admit: 2018-04-27 | Discharge: 2018-04-29 | Disposition: A | Discharge status: Home / Self Care | Payer: Medicare Other | Attending: Internal Medicine | Admitting: Internal Medicine

## 2018-04-27 ENCOUNTER — Encounter (HOSPITAL_COMMUNITY): Payer: Self-pay | Admitting: Emergency Medicine

## 2018-04-27 ENCOUNTER — Emergency Department (HOSPITAL_COMMUNITY): Payer: Medicare Other

## 2018-04-27 ENCOUNTER — Other Ambulatory Visit: Payer: Self-pay

## 2018-04-27 DIAGNOSIS — I130 Hypertensive heart and chronic kidney disease with heart failure and stage 1 through stage 4 chronic kidney disease, or unspecified chronic kidney disease: Principal | ICD-10-CM | POA: Insufficient documentation

## 2018-04-27 DIAGNOSIS — Z8673 Personal history of transient ischemic attack (TIA), and cerebral infarction without residual deficits: Secondary | ICD-10-CM | POA: Insufficient documentation

## 2018-04-27 DIAGNOSIS — E785 Hyperlipidemia, unspecified: Secondary | ICD-10-CM | POA: Diagnosis not present

## 2018-04-27 DIAGNOSIS — I160 Hypertensive urgency: Secondary | ICD-10-CM | POA: Diagnosis present

## 2018-04-27 DIAGNOSIS — Z79899 Other long term (current) drug therapy: Secondary | ICD-10-CM | POA: Insufficient documentation

## 2018-04-27 DIAGNOSIS — E119 Type 2 diabetes mellitus without complications: Secondary | ICD-10-CM | POA: Diagnosis not present

## 2018-04-27 DIAGNOSIS — I509 Heart failure, unspecified: Secondary | ICD-10-CM

## 2018-04-27 DIAGNOSIS — C61 Malignant neoplasm of prostate: Secondary | ICD-10-CM | POA: Diagnosis present

## 2018-04-27 DIAGNOSIS — Z8546 Personal history of malignant neoplasm of prostate: Secondary | ICD-10-CM | POA: Diagnosis not present

## 2018-04-27 DIAGNOSIS — I5033 Acute on chronic diastolic (congestive) heart failure: Secondary | ICD-10-CM | POA: Diagnosis present

## 2018-04-27 DIAGNOSIS — R51 Headache: Secondary | ICD-10-CM | POA: Diagnosis not present

## 2018-04-27 DIAGNOSIS — Z87891 Personal history of nicotine dependence: Secondary | ICD-10-CM | POA: Diagnosis not present

## 2018-04-27 NOTE — ED Triage Notes (Signed)
Pt from home with c/o sob that has been ongoing x 1 week but got increasingly worse while he was working on his computer. Pt has hx of htn and is hypertensive at time of assessment. Pt denies CP but had sudden onset headache.

## 2018-04-27 NOTE — ED Provider Notes (Signed)
[facility] DEPT Provider Note   CSN: [id number] Arrival date & time: 04/27/18  2332     History   Chief Complaint Chief Complaint  Patient presents with  . Shortness of Breath    HPI Nyron Mozer is a 83 y.o. male.  Patient presents to the emergency department for evaluation of progressively worsening shortness of breath over period of 3 weeks.  Patient reports that he has noticed over this period of time that he has had progressive worsening fatigue and dyspnea on exertion.  He has not had any associated chest pain.  He does report that he has had some swelling of his ankles in the past, but is on a fluid pill and he does not have any swelling currently.  Denies any previous cardiac or pulmonary history.  He has not had any cough, chest congestion, fever.     Past Medical History:  Diagnosis Date  . Abnormal PSA 01/17/2012   10.38/4/5/ PSA:2013=6.96/ PSA 07/08/2011=6.26/ PSA 2006=8.6  . Blood transfusion without reported diagnosis    during vagotomy 1976  . Cancer [facility]) 2006   L-2 solitary metastatic lesion/no rad tx ,asymptomatic  . Chronic kidney disease    nephrolithiasis right kidney  . Gynecomastia 04/21/11   seen by Dr.Murray, no rad tx  . History of echocardiogram    echo 4/16:  mild LVH, EF 55-60%, Gr 1 DD, no RWMA, mild AI, mild MR, mod LAE, mild TR  . Hx of cardiovascular stress test    Myoview 5/16:  EF 50%, no scar or ischemia, low risk  . Hydroureteronephrosis    right   . Hypertension   . Osteopenia   . Overweight   . Prostate cancer ([place]) 12/09/03 dx   Prostate ca/adenocarcinoma,gleason=3+3=6    pSA 4.6  . Sleep apnea    pt unaware  . Stroke [facility])    asymptomatic, found on head imaging  . Ulcer    peptic  . Use of leuprolide acetate (Lupron) 06/2005-11/2006   02/2008 degarelix 02/2008 x 3 doses    Patient Active Problem List   Diagnosis Date Noted  . Acute on chronic kidney failure ([place]) 07/20/2015  . HLD  (hyperlipidemia) 07/20/2015  . Stroke (cerebrum) ([place]) 07/20/2015  . Elevated troponin 07/20/2015  . Diabetes mellitus without complication ([place]) 40/98/1191  . Nonsustained paroxysmal ventricular tachycardia ([place]) 12/08/2014  . RBBB 10/22/2014  . Bradycardia 10/22/2014  . PVC's (premature ventricular contractions) 10/22/2014  . Chronic kidney disease 10/20/2014  . Hypertensive urgency 10/20/2014  . Syncope and collapse 10/20/2014  . Use of leuprolide acetate (Lupron)   . Hydroureteronephrosis   . Blood transfusion without reported diagnosis   . Blurry vision   . Old lacunar strokes by MRI   . Hx of Prostate cancer   . Cancer ([place])   . Abnormal PSA 01/17/2012    Past Surgical History:  Procedure Laterality Date  . PROSTATE BIOPSY  12/09/03   adenocarcinoma,glerason: 3+3=6  . VAGOTOMY  1978   partial        Home Medications    Prior to Admission medications   Medication Sig Start Date End Date Taking? Authorizing Provider  alfuzosin (UROXATRAL) 10 MG 24 hr tablet Take 10 mg by mouth daily. 03/08/18  Yes [provider]  amLODipine (NORVASC) 5 MG tablet Take 5 mg by mouth daily. 04/25/18  Yes [provider]  Calcium-Vitamin D (CALTRATE 600 PLUS-VIT D PO) Take 1 tablet by mouth daily.    Yes [provider]  lisinopril (PRINIVIL,ZESTRIL) 20 MG tablet Take 20 mg by mouth daily. 02/06/18  Yes [provider]    Family History Family History  Problem Relation Age of Onset  . Colon cancer Neg Hx   . Stroke Neg Hx     Social History Social History   Tobacco Use  . Smoking status: Former Smoker    Types: Pipe    Last attempt to quit: 07/05/1984    Years since quitting: 33.8  . Smokeless tobacco: Never Used  Substance Use Topics  . Alcohol use: No  . Drug use: No     Allergies   Patient has no known allergies.   Review of Systems Review of Systems  Constitutional: Positive for fatigue.  Respiratory: Positive for shortness of  breath.   All other systems reviewed and are negative.    Physical Exam Updated Vital Signs BP (!) 151/100   Pulse 80   Temp 98.7 F (37.1 C) (Oral)   Resp 17   SpO2 94%   Physical Exam  Constitutional: He is oriented to person, place, and time. He appears well-developed and well-nourished. No distress.  HENT:  Head: Normocephalic and atraumatic.  Right Ear: Hearing normal.  Left Ear: Hearing normal.  Nose: Nose normal.  Mouth/Throat: Oropharynx is clear and moist and mucous membranes are normal.  Eyes: Pupils are equal, round, and reactive to light. Conjunctivae and EOM are normal.  Neck: Normal range of motion. Neck supple.  Cardiovascular: Regular rhythm, S1 normal and S2 normal. Exam reveals no gallop and no friction rub.  No murmur heard. Pulmonary/Chest: Effort normal and breath sounds normal. No respiratory distress. He exhibits no tenderness.  Abdominal: Soft. Normal appearance and bowel sounds are normal. There is no hepatosplenomegaly. There is no tenderness. There is no rebound, no guarding, no tenderness at McBurney's point and negative Murphy's sign. No hernia.  Musculoskeletal: Normal range of motion.  Neurological: He is alert and oriented to person, place, and time. He has normal strength. No cranial nerve deficit or sensory deficit. Coordination normal. GCS eye subscore is 4. GCS verbal subscore is 5. GCS motor subscore is 6.  Skin: Skin is warm, dry and intact. No rash noted. No cyanosis.  Psychiatric: He has a normal mood and affect. His speech is normal and behavior is normal. Thought content normal.  Nursing note and vitals reviewed.    ED Treatments / Results  Labs (all labs ordered are listed, but only abnormal results are displayed) Labs Reviewed  COMPREHENSIVE METABOLIC PANEL - Abnormal; Notable for the following components:      Result Value   Glucose, Bld 197 (*)    Calcium 8.7 (*)    AST 48 (*)    ALT 50 (*)    All other components within  normal limits  BRAIN NATRIURETIC PEPTIDE - Abnormal; Notable for the following components:   B Natriuretic Peptide 248.9 (*)    All other components within normal limits  CBC WITH DIFFERENTIAL/PLATELET - Abnormal; Notable for the following components:   Hemoglobin 12.7 (*)    All other components within normal limits  CBC WITH DIFFERENTIAL/PLATELET  I-STAT TROPONIN, ED    EKG EKG Interpretation  Date/Time:  Thursday April 27 2018 23:41:43 EDT Ventricular Rate:  89 PR Interval:    QRS Duration: 147 QT Interval:  418 QTC Calculation: 509 R Axis:   -76 Text Interpretation:  Sinus rhythm Ventricular premature complex Right bundle branch block Inferior infarct, old No significant change since last  tracing Confirmed by Orpah Greek [id number]) on 04/28/2018 12:06:57 AM   Radiology Dg Chest 2 View  Result Date: 04/28/2018 CLINICAL DATA:  Shortness of breath. Symptoms for 1 week, worsening. EXAM: CHEST - 2 VIEW COMPARISON:  Radiograph 07/19/2015. FINDINGS: Unchanged cardiomegaly and tortuous thoracic aorta. Mild vascular congestion. Suspect early pulmonary edema with faint Kerley B-lines at the lung bases. Trace pleural effusions. No confluent airspace disease. No pneumothorax. No acute osseous abnormalities. IMPRESSION: 1. Unchanged cardiomegaly and tortuous thoracic aorta. 2. Vascular congestion with suspected mild pulmonary edema. Tiny pleural effusions. Electronically Signed   By: Rakin Rake M.D.   On: 04/28/2018 00:04   Ct Angio Chest Pe W Or Wo Contrast  Result Date: 04/28/2018 CLINICAL DATA:  Shortness of breath. EXAM: CT ANGIOGRAPHY CHEST WITH CONTRAST TECHNIQUE: Multidetector CT imaging of the chest was performed using the standard protocol during bolus administration of intravenous contrast. Multiplanar CT image reconstructions and MIPs were obtained to evaluate the vascular anatomy. CONTRAST:  111mL ISOVUE-370 IOPAMIDOL (ISOVUE-370) INJECTION 76% COMPARISON:   Radiograph yesterday.  Chest CT 04/26/2005 FINDINGS: Cardiovascular: There are no filling defects within the pulmonary arteries to suggest pulmonary embolus. Multi chamber cardiomegaly with contrast refluxing into the hepatic veins and IVC. Diffuse aortic atherosclerosis and tortuosity. Fusiform aneurysmal dilatation of the ascending aorta, 4.1 cm. No periaortic stranding. Limited assessment for dissection given phase of contrast tailored to pulmonary artery evaluation. There are coronary artery calcifications. Mediastinum/Nodes: Multiple small mediastinal and hilar nodes, not enlarged by size criteria. The esophagus is decompressed. No visualized thyroid nodule. Lungs/Pleura: No confluent airspace disease. Mild basilar congestive changes and bronchial thickening. Scattered atelectasis. Motion artifact limits detailed assessment. No significant pleural effusion. No pulmonary mass. Trachea and mainstem bronchi are patent. Upper Abdomen: Contrast refluxes into the hepatic veins and IVC. Atherosclerosis of included upper abdominal vasculature. Surgical clips at the gastroesophageal junction. Musculoskeletal: No focal osseous lesion or acute abnormality. Mild degenerative change in the spine. Review of the MIP images confirms the above findings. IMPRESSION: 1. No pulmonary embolus. 2. Cardiomegaly with elevated right heart pressures. Mild vascular congestion. 3. Aortic atherosclerosis and tortuosity. Fusiform ascending aneurysmal dilatation, maximal dimension 4.1 cm. Recommend annual imaging followup by CTA or MRA. This recommendation follows 2010 ACCF/AHA/AATS/ACR/ASA/SCA/SCAI/SIR/STS/SVM Guidelines for the Diagnosis and Management of Patients with Thoracic Aortic Disease. Circulation. 2010; 121: N562-Z308 4. Dense coronary artery calcifications versus stents. Aortic Atherosclerosis (ICD10-I70.0). Electronically Signed   By: Kodey Rake M.D.   On: 04/28/2018 03:39    Procedures Procedures (including critical  care time)  Medications Ordered in ED Medications  sodium chloride 0.9 % injection (has no administration in time range)  iopamidol (ISOVUE-370) 76 % injection (has no administration in time range)  nitroGLYCERIN (NITROSTAT) SL tablet 0.4 mg (0.4 mg Sublingual Given 04/28/18 0100)  nitroGLYCERIN (NITROGLYN) 2 % ointment 1 inch (1 inch Topical Given 04/28/18 0100)  furosemide (LASIX) injection 40 mg (40 mg Intravenous Given 04/28/18 0100)  iopamidol (ISOVUE-370) 76 % injection 100 mL (100 mLs Intravenous Contrast Given 04/28/18 0322)     Initial Impression / Assessment and Plan / ED Course  I have reviewed the triage vital signs and the nursing notes.  Pertinent labs & imaging results that were available during my care of the patient were reviewed by me and considered in my medical decision making (see chart for details).     Patient presents to the emergency department for evaluation of difficulty breathing.  Patient reports a 3-week history of progressively worsening dyspnea on  exertion and shortness of breath at rest.  He has not experienced any chest pain associated with the symptoms.  Patient initially told me that he was on medication for "swelling of his ankles".  Further discussions with him, however, reveals that he has been somewhat noncompliant with medications and is not clear which ones he has been taking and when.  He has not currently on a diuretic.  He does have a history of documented diastolic heart failure.  Patient was very dyspneic at arrival.  He has been given Lasix and has diuresed 1.5 L, but is still dyspneic, tachycardic and hypoxic to 88% on room air with 25 feet of walking in the ER.  He will therefore be admitted.  Final Clinical Impressions(s) / ED Diagnoses   Final diagnoses:  Acute on chronic diastolic congestive heart failure [facility])    ED Discharge Orders    None       Senay Sistrunk, Gwenyth Allegra, MD 04/28/18 [id number]

## 2018-04-28 ENCOUNTER — Other Ambulatory Visit: Payer: Self-pay

## 2018-04-28 ENCOUNTER — Observation Stay (HOSPITAL_COMMUNITY): Payer: Medicare Other

## 2018-04-28 ENCOUNTER — Emergency Department (HOSPITAL_COMMUNITY): Payer: Medicare Other

## 2018-04-28 ENCOUNTER — Encounter (HOSPITAL_COMMUNITY): Payer: Self-pay | Admitting: Internal Medicine

## 2018-04-28 DIAGNOSIS — I5033 Acute on chronic diastolic (congestive) heart failure: Secondary | ICD-10-CM | POA: Diagnosis present

## 2018-04-28 DIAGNOSIS — I160 Hypertensive urgency: Secondary | ICD-10-CM | POA: Diagnosis not present

## 2018-04-28 DIAGNOSIS — I509 Heart failure, unspecified: Secondary | ICD-10-CM

## 2018-04-28 LAB — CBC WITH DIFFERENTIAL/PLATELET
Abs Immature Granulocytes: 0.02 10*3/uL (ref 0.00–0.07)
Basophils Absolute: 0.1 10*3/uL (ref 0.0–0.1)
Basophils Relative: 1 %
Eosinophils Absolute: 0.1 10*3/uL (ref 0.0–0.5)
Eosinophils Relative: 2 %
HCT: 40.4 % (ref 39.0–52.0)
Hemoglobin: 12.7 g/dL — ABNORMAL LOW (ref 13.0–17.0)
Immature Granulocytes: 0 %
Lymphocytes Relative: 33 %
Lymphs Abs: 2 10*3/uL (ref 0.7–4.0)
MCH: 29.2 pg (ref 26.0–34.0)
MCHC: 31.4 g/dL (ref 30.0–36.0)
MCV: 92.9 fL (ref 80.0–100.0)
Monocytes Absolute: 0.5 10*3/uL (ref 0.1–1.0)
Monocytes Relative: 8 %
Neutro Abs: 3.4 10*3/uL (ref 1.7–7.7)
Neutrophils Relative %: 56 %
Platelets: 160 10*3/uL (ref 150–400)
RBC: 4.35 MIL/uL (ref 4.22–5.81)
RDW: 14.3 % (ref 11.5–15.5)
WBC: 6.1 10*3/uL (ref 4.0–10.5)
nRBC: 0 % (ref 0.0–0.2)

## 2018-04-28 LAB — COMPREHENSIVE METABOLIC PANEL
ALBUMIN: 3.6 g/dL (ref 3.5–5.0)
ALT: 50 U/L — ABNORMAL HIGH (ref 0–44)
AST: 48 U/L — AB (ref 15–41)
Alkaline Phosphatase: 72 U/L (ref 38–126)
Anion gap: 12 (ref 5–15)
BUN: 17 mg/dL (ref 8–23)
CHLORIDE: 104 mmol/L (ref 98–111)
CO2: 24 mmol/L (ref 22–32)
Calcium: 8.7 mg/dL — ABNORMAL LOW (ref 8.9–10.3)
Creatinine, Ser: 0.86 mg/dL (ref 0.61–1.24)
GFR calc Af Amer: >60 mL/min (ref >60)
GLUCOSE: 197 mg/dL — AB (ref 70–99)
POTASSIUM: 4 mmol/L (ref 3.5–5.1)
Sodium: 140 mmol/L (ref 135–145)
Total Bilirubin: 0.7 mg/dL (ref 0.3–1.2)
Total Protein: 7.1 g/dL (ref 6.5–8.1)

## 2018-04-28 LAB — HEMOGLOBIN A1C
HEMOGLOBIN A1C: 7.5 % — AB (ref 4.8–5.6)
Mean Plasma Glucose: 168.55 mg/dL

## 2018-04-28 LAB — CBC
HCT: 41.3 % (ref 39.0–52.0)
HEMOGLOBIN: 13 g/dL (ref 13.0–17.0)
MCH: 29 pg (ref 26.0–34.0)
MCHC: 31.5 g/dL (ref 30.0–36.0)
MCV: 92.2 fL (ref 80.0–100.0)
NRBC: 0 % (ref 0.0–0.2)
PLATELETS: 168 10*3/uL (ref 150–400)
RBC: 4.48 MIL/uL (ref 4.22–5.81)
RDW: 14.4 % (ref 11.5–15.5)
WBC: 6.3 10*3/uL (ref 4.0–10.5)

## 2018-04-28 LAB — CREATININE, SERUM
CREATININE: 0.99 mg/dL (ref 0.61–1.24)
GFR calc non Af Amer: >60 mL/min (ref >60)

## 2018-04-28 LAB — MAGNESIUM: Magnesium: 2.3 mg/dL (ref 1.7–2.4)

## 2018-04-28 LAB — TROPONIN I
TROPONIN I: 0.07 ng/mL — AB (ref <0.03)
TROPONIN I: 0.07 ng/mL — AB (ref <0.03)
Troponin I: 0.06 ng/mL (ref <0.03)

## 2018-04-28 LAB — BRAIN NATRIURETIC PEPTIDE: B NATRIURETIC PEPTIDE 5: 248.9 pg/mL — AB (ref 0.0–100.0)

## 2018-04-28 LAB — I-STAT TROPONIN, ED: Troponin i, poc: 0.05 ng/mL (ref 0.00–0.08)

## 2018-04-28 MED ORDER — HYDRALAZINE HCL 20 MG/ML IJ SOLN
5.0000 mg | INTRAMUSCULAR | Status: DC | PRN
  Administered 2018-04-28: 5 mg via INTRAVENOUS
  Filled 2018-04-28: qty 1

## 2018-04-28 MED ORDER — IOPAMIDOL (ISOVUE-370) INJECTION 76%
100.0000 mL | Freq: Once | INTRAVENOUS | Status: AC | PRN
  Administered 2018-04-28: 100 mL via INTRAVENOUS

## 2018-04-28 MED ORDER — LISINOPRIL 20 MG PO TABS
20.0000 mg | ORAL_TABLET | Freq: Every day | ORAL | Status: DC
  Administered 2018-04-28 – 2018-04-29 (×2): 20 mg via ORAL
  Filled 2018-04-28 (×2): qty 1

## 2018-04-28 MED ORDER — FUROSEMIDE 10 MG/ML IJ SOLN
40.0000 mg | Freq: Two times a day (BID) | INTRAMUSCULAR | Status: DC
  Administered 2018-04-28 – 2018-04-29 (×2): 40 mg via INTRAVENOUS
  Filled 2018-04-28 (×2): qty 4

## 2018-04-28 MED ORDER — ACETAMINOPHEN 325 MG PO TABS: 650.0000 mg | ORAL_TABLET | Freq: Four times a day (QID) | ORAL | Status: DC | PRN

## 2018-04-28 MED ORDER — ACETAMINOPHEN 650 MG RE SUPP: 650.0000 mg | Freq: Four times a day (QID) | RECTAL | Status: DC | PRN

## 2018-04-28 MED ORDER — ONDANSETRON HCL 4 MG PO TABS: 4.0000 mg | ORAL_TABLET | Freq: Four times a day (QID) | ORAL | Status: DC | PRN

## 2018-04-28 MED ORDER — FUROSEMIDE 10 MG/ML IJ SOLN
40.0000 mg | Freq: Once | INTRAMUSCULAR | Status: AC
  Administered 2018-04-28: 40 mg via INTRAVENOUS
  Filled 2018-04-28: qty 4

## 2018-04-28 MED ORDER — NITROGLYCERIN 0.4 MG SL SUBL
0.4000 mg | SUBLINGUAL_TABLET | Freq: Once | SUBLINGUAL | Status: AC
  Administered 2018-04-28: 0.4 mg via SUBLINGUAL
  Filled 2018-04-28: qty 1

## 2018-04-28 MED ORDER — NITROGLYCERIN 2 % TD OINT
1.0000 [in_us] | TOPICAL_OINTMENT | Freq: Once | TRANSDERMAL | Status: AC
  Administered 2018-04-28: 1 [in_us] via TOPICAL
  Filled 2018-04-28: qty 1

## 2018-04-28 MED ORDER — SODIUM CHLORIDE 0.9 % IJ SOLN
INTRAMUSCULAR | Status: AC
  Filled 2018-04-28: qty 50

## 2018-04-28 MED ORDER — ONDANSETRON HCL 4 MG/2ML IJ SOLN: 4.0000 mg | Freq: Four times a day (QID) | INTRAMUSCULAR | Status: DC | PRN

## 2018-04-28 MED ORDER — IOPAMIDOL (ISOVUE-370) INJECTION 76%
INTRAVENOUS | Status: AC
  Filled 2018-04-28: qty 100

## 2018-04-28 MED ORDER — ENOXAPARIN SODIUM 40 MG/0.4ML ~~LOC~~ SOLN
40.0000 mg | SUBCUTANEOUS | Status: DC
  Administered 2018-04-28 – 2018-04-29 (×2): 40 mg via SUBCUTANEOUS
  Filled 2018-04-28 (×2): qty 0.4

## 2018-04-28 MED ORDER — POTASSIUM CHLORIDE CRYS ER 20 MEQ PO TBCR
20.0000 meq | EXTENDED_RELEASE_TABLET | Freq: Every day | ORAL | Status: DC
  Administered 2018-04-28 – 2018-04-29 (×2): 20 meq via ORAL
  Filled 2018-04-28 (×2): qty 1

## 2018-04-28 MED ORDER — ALFUZOSIN HCL ER 10 MG PO TB24
10.0000 mg | ORAL_TABLET | Freq: Every day | ORAL | Status: DC
  Administered 2018-04-28 – 2018-04-29 (×2): 10 mg via ORAL
  Filled 2018-04-28 (×2): qty 1

## 2018-04-28 MED ORDER — AMLODIPINE BESYLATE 5 MG PO TABS
5.0000 mg | ORAL_TABLET | Freq: Every day | ORAL | Status: DC
  Administered 2018-04-28 – 2018-04-29 (×2): 5 mg via ORAL
  Filled 2018-04-28 (×2): qty 1

## 2018-04-28 NOTE — Progress Notes (Addendum)
PROGRESS NOTE    [name]  UVO:[id number] DOB: 08-Mar-1935 DOA: 04/27/2018 PCP: [name], MD    Brief Narrative:  83 year old male who presented with dyspnea, he does have significant past medical history for hypertension, dyslipidemia and diastolic heart failure.  Reported progressive dyspnea and orthopnea, no chest pain but increased abdominal girth.  He has been not taking his antihypertensive as prescribed for last 3 days   Assessment & Plan:   Principal Problem:   Acute on chronic diastolic CHF (congestive heart failure) (HCC) Active Problems:   Hx of Prostate cancer   Hypertensive urgency   Acute CHF (congestive heart failure) ([place])   1. Decompensated diastolic heart failure, acute on chronic. Will continue diuresis with IV furosemide to target a negative fluid balance, urine output since admission 1450 ml, continue blood pressure control with amlodipine and lisinopril. Flat troponin no clinical signs of ACS. Follow on echocardiogram.  2. Uncontrolled HTN. Will continue blood pressure control with amlodipine and lisinopril.   3. Hx of prostate cancer. No signs of urinary retention. Continue uroxatral.      DVT prophylaxis: enoxaparin   Code Status: full Family Communication: no family at the bedside  Disposition Plan/ discharge barriers:  Pending clinical improvement.    Consultants:     Procedures:     Antimicrobials:       Subjective: Patient continue to have dyspnea, but improved from yesterday, no nausea or vomiting, no chest pain. Not compliant with blood pressure medications, no angina at home.   Objective: Vitals:   04/28/18 0430 04/28/18 0500 04/28/18 0502 04/28/18 0547  BP: (!) 168/104 (!) 163/102 (!) 163/102 (!) 148/78  Pulse: 81 89 78 86  Resp: 19 (!) 22 (!) 21 20  Temp:    98 F (36.7 C)  TempSrc:    Oral  SpO2: 95% 92% 93% 96%  Weight:    111.1 kg  Height:    6' (1.829 m)    Intake/Output Summary (Last 24 hours) at 04/28/2018  1604 Last data filed at 04/28/2018 0945 Gross per 24 hour  Intake 120 ml  Output 1450 ml  Net -1330 ml   Filed Weights   04/28/18 0547  Weight: 111.1 kg    Examination:   General: Not in pain or dyspnea Neurology: Awake and alert, non focal  E ENT: no pallor, no icterus, oral mucosa moist Cardiovascular: No JVD. S1-S2 present, rhythmic, no gallops, rubs, or 3/6 systolic murmur at the apex radiated to the axilla. No lower extremity edema. Pulmonary: decreased breath sounds bilaterally, poor air movement, no wheezing, rhonchi or rales. Gastrointestinal. Abdomen protuberant with no organomegaly, non tender, no rebound or guarding Skin. No rashes Musculoskeletal: no joint deformities     Data Reviewed: I have personally reviewed following labs and imaging studies  CBC: Recent Labs  Lab 04/28/18 0055 04/28/18 0618  WBC 6.1 6.3  NEUTROABS 3.4  --   HGB 12.7* 13.0  HCT 40.4 41.3  MCV 92.9 92.2  PLT 160 742   Basic Metabolic Panel: Recent Labs  Lab 04/28/18 0017 04/28/18 0618  NA 140  --   K 4.0  --   CL 104  --   CO2 24  --   GLUCOSE 197*  --   BUN 17  --   CREATININE 0.86 0.99  CALCIUM 8.7*  --   MG  --  2.3   GFR: Estimated Creatinine Clearance: 75.3 mL/min (by C-G formula based on SCr of 0.99 mg/dL). Liver Function Tests: Recent  Labs  Lab 04/28/18 0017  AST 48*  ALT 50*  ALKPHOS 72  BILITOT 0.7  PROT 7.1  ALBUMIN 3.6   No results for input(s): LIPASE, AMYLASE in the last 168 hours. No results for input(s): AMMONIA in the last 168 hours. Coagulation Profile: No results for input(s): INR, PROTIME in the last 168 hours. Cardiac Enzymes: Recent Labs  Lab 04/28/18 0618 04/28/18 1126  TROPONINI 0.07* 0.07*   BNP (last 3 results) No results for input(s): PROBNP in the last 8760 hours. HbA1C: Recent Labs    04/28/18 0618  HGBA1C 7.5*   CBG: No results for input(s): GLUCAP in the last 168 hours. Lipid Profile: No results for input(s): CHOL,  HDL, LDLCALC, TRIG, CHOLHDL, LDLDIRECT in the last 72 hours. Thyroid Function Tests: No results for input(s): TSH, T4TOTAL, FREET4, T3FREE, THYROIDAB in the last 72 hours. Anemia Panel: No results for input(s): VITAMINB12, FOLATE, FERRITIN, TIBC, IRON, RETICCTPCT in the last 72 hours.    Radiology Studies: I have reviewed all of the imaging during this hospital visit personally     Scheduled Meds: . alfuzosin  10 mg Oral Daily  . amLODipine  5 mg Oral Daily  . enoxaparin (LOVENOX) injection  40 mg Subcutaneous Q24H  . furosemide  40 mg Intravenous Q12H  . lisinopril  20 mg Oral Daily  . potassium chloride  20 mEq Oral Daily   Continuous Infusions:   LOS: 0 days        [name], MD [facility] Pager [phone]

## 2018-04-28 NOTE — ED Notes (Signed)
Ambulated patient in hallway, saturations 88-93%, hr increased to 100. EDP aware

## 2018-04-28 NOTE — ED Notes (Signed)
ED TO INPATIENT HANDOFF REPORT  Name/Age/Gender [name] 83 y.o. male  Code Status    Code Status Orders  (From admission, onward)         Start     Ordered   04/28/18 0514  Full code  Continuous     04/28/18 0516        Code Status History    Date Active Date Inactive Code Status Order ID Comments User Context   07/20/2015 0327 07/20/2015 1941 DNR [id number]  Ivor Costa, MD Inpatient   10/20/2014 1706 10/23/2014 0132 Full Code [id number]  Nita Sells, MD Inpatient      Home/SNF/Other Home  Chief Complaint Shortness of Breath   Level of Care/Admitting Diagnosis ED Disposition    ED Disposition Condition [place]: [facility] [[id number]]  Level of Care: Telemetry [5]  Admit to tele based on following criteria: Acute CHF  Diagnosis: Acute CHF (congestive heart failure) [facility]) [[id number]]  Admitting Physician: [name] [phone]  Attending Physician: [name] Lei.Right  PT Class (Do Not Modify): Observation [104]  PT Acc Code (Do Not Modify): Observation [10022]       Medical History Past Medical History:  Diagnosis Date  . Abnormal PSA 01/17/2012   10.38/4/5/ PSA:2013=6.96/ PSA 07/08/2011=6.26/ PSA 2006=8.6  . Blood transfusion without reported diagnosis    during vagotomy 1976  . Cancer [facility]) 2006   L-2 solitary metastatic lesion/no rad tx ,asymptomatic  . Chronic kidney disease    nephrolithiasis right kidney  . Gynecomastia 04/21/11   seen by Dr.Murray, no rad tx  . History of echocardiogram    echo 4/16:  mild LVH, EF 55-60%, Gr 1 DD, no RWMA, mild AI, mild MR, mod LAE, mild TR  . Hx of cardiovascular stress test    Myoview 5/16:  EF 50%, no scar or ischemia, low risk  . Hydroureteronephrosis    right   . Hypertension   . Osteopenia   . Overweight   . Prostate cancer ([place]) 12/09/03 dx   Prostate ca/adenocarcinoma,gleason=3+3=6    pSA 4.6  . Sleep apnea    pt unaware  . Stroke [facility])     asymptomatic, found on head imaging  . Ulcer    peptic  . Use of leuprolide acetate (Lupron) 06/2005-11/2006   02/2008 degarelix 02/2008 x 3 doses    Allergies No Known Allergies  IV Location/Drains/Wounds Patient Lines/Drains/Airways Status   Active Line/Drains/Airways    Name:   Placement date:   Placement time:   Site:   Days:   Peripheral IV 04/28/18 Left Forearm   04/28/18    0030    Forearm   less than 1          Labs/Imaging Results for orders placed or performed during the hospital encounter of 04/27/18 (from the past 48 hour(s))  Comprehensive metabolic panel     Status: Abnormal   Collection Time: 04/28/18 12:17 AM  Result Value Ref Range   Sodium 140 135 - 145 mmol/L   Potassium 4.0 3.5 - 5.1 mmol/L   Chloride 104 98 - 111 mmol/L   CO2 24 22 - 32 mmol/L   Glucose, Bld 197 (H) 70 - 99 mg/dL   BUN 17 8 - 23 mg/dL   Creatinine, Ser 0.86 0.61 - 1.24 mg/dL   Calcium 8.7 (L) 8.9 - 10.3 mg/dL   Total Protein 7.1 6.5 - 8.1 g/dL   Albumin 3.6 3.5 - 5.0 g/dL  AST 48 (H) 15 - 41 U/L   ALT 50 (H) 0 - 44 U/L   Alkaline Phosphatase 72 38 - 126 U/L   Total Bilirubin 0.7 0.3 - 1.2 mg/dL   GFR calc non Af Amer >60 >60 mL/min   GFR calc Af Amer >60 >60 mL/min    Comment: (NOTE) The eGFR has been calculated using the CKD EPI equation. This calculation has not been validated in all clinical situations. eGFR's persistently <60 mL/min signify possible Chronic Kidney Disease.    Anion gap 12 5 - 15    Comment: Performed at [facility], [place] [address] [place], [place] [address]  Brain natriuretic peptide     Status: Abnormal   Collection Time: 04/28/18 12:17 AM  Result Value Ref Range   B Natriuretic Peptide 248.9 (H) 0.0 - 100.0 pg/mL    Comment: Performed at [facility], [place] [address], [place], [place] [address]  I-stat troponin, ED     Status: None   Collection Time: 04/28/18 12:22 AM  Result Value Ref Range   Troponin i, poc 0.05  0.00 - 0.08 ng/mL   Comment 3            Comment: Due to the release kinetics of cTnI, a negative result within the first hours of the onset of symptoms does not rule out myocardial infarction with certainty. If myocardial infarction is still suspected, repeat the test at appropriate intervals.   CBC with Differential/Platelet     Status: Abnormal   Collection Time: 04/28/18 12:55 AM  Result Value Ref Range   WBC 6.1 4.0 - 10.5 K/uL   RBC 4.35 4.22 - 5.81 MIL/uL   Hemoglobin 12.7 (L) 13.0 - 17.0 g/dL   HCT 40.4 39.0 - 52.0 %   MCV 92.9 80.0 - 100.0 fL   MCH 29.2 26.0 - 34.0 pg   MCHC 31.4 30.0 - 36.0 g/dL   RDW 14.3 11.5 - 15.5 %   Platelets 160 150 - 400 K/uL    Comment: REPEATED TO VERIFY SPECIMEN CHECKED FOR CLOTS    nRBC 0.0 0.0 - 0.2 %   Neutrophils Relative % 56 %   Neutro Abs 3.4 1.7 - 7.7 K/uL   Lymphocytes Relative 33 %   Lymphs Abs 2.0 0.7 - 4.0 K/uL   Monocytes Relative 8 %   Monocytes Absolute 0.5 0.1 - 1.0 K/uL   Eosinophils Relative 2 %   Eosinophils Absolute 0.1 0.0 - 0.5 K/uL   Basophils Relative 1 %   Basophils Absolute 0.1 0.0 - 0.1 K/uL   Immature Granulocytes 0 %   Abs Immature Granulocytes 0.02 0.00 - 0.07 K/uL    Comment: Performed at [facility], [place] [address] [place], [place] [address]   Dg Chest 2 View  Result Date: 04/28/2018 CLINICAL DATA:  Shortness of breath. Symptoms for 1 week, worsening. EXAM: CHEST - 2 VIEW COMPARISON:  Radiograph 07/19/2015. FINDINGS: Unchanged cardiomegaly and tortuous thoracic aorta. Mild vascular congestion. Suspect early pulmonary edema with faint Kerley B-lines at the lung bases. Trace pleural effusions. No confluent airspace disease. No pneumothorax. No acute osseous abnormalities. IMPRESSION: 1. Unchanged cardiomegaly and tortuous thoracic aorta. 2. Vascular congestion with suspected mild pulmonary edema. Tiny pleural effusions. Electronically Signed   By: Zailyn Rake M.D.   On: 04/28/2018  00:04   Ct Angio Chest Pe W Or Wo Contrast  Result Date: 04/28/2018 CLINICAL DATA:  Shortness of breath. EXAM: CT ANGIOGRAPHY CHEST WITH  CONTRAST TECHNIQUE: Multidetector CT imaging of the chest was performed using the standard protocol during bolus administration of intravenous contrast. Multiplanar CT image reconstructions and MIPs were obtained to evaluate the vascular anatomy. CONTRAST:  123m ISOVUE-370 IOPAMIDOL (ISOVUE-370) INJECTION 76% COMPARISON:  Radiograph yesterday.  Chest CT 04/26/2005 FINDINGS: Cardiovascular: There are no filling defects within the pulmonary arteries to suggest pulmonary embolus. Multi chamber cardiomegaly with contrast refluxing into the hepatic veins and IVC. Diffuse aortic atherosclerosis and tortuosity. Fusiform aneurysmal dilatation of the ascending aorta, 4.1 cm. No periaortic stranding. Limited assessment for dissection given phase of contrast tailored to pulmonary artery evaluation. There are coronary artery calcifications. Mediastinum/Nodes: Multiple small mediastinal and hilar nodes, not enlarged by size criteria. The esophagus is decompressed. No visualized thyroid nodule. Lungs/Pleura: No confluent airspace disease. Mild basilar congestive changes and bronchial thickening. Scattered atelectasis. Motion artifact limits detailed assessment. No significant pleural effusion. No pulmonary mass. Trachea and mainstem bronchi are patent. Upper Abdomen: Contrast refluxes into the hepatic veins and IVC. Atherosclerosis of included upper abdominal vasculature. Surgical clips at the gastroesophageal junction. Musculoskeletal: No focal osseous lesion or acute abnormality. Mild degenerative change in the spine. Review of the MIP images confirms the above findings. IMPRESSION: 1. No pulmonary embolus. 2. Cardiomegaly with elevated right heart pressures. Mild vascular congestion. 3. Aortic atherosclerosis and tortuosity. Fusiform ascending aneurysmal dilatation, maximal dimension  4.1 cm. Recommend annual imaging followup by CTA or MRA. This recommendation follows 2010 ACCF/AHA/AATS/ACR/ASA/SCA/SCAI/SIR/STS/SVM Guidelines for the Diagnosis and Management of Patients with Thoracic Aortic Disease. Circulation. 2010; 121: eZ610-R6044. Dense coronary artery calcifications versus stents. Aortic Atherosclerosis (ICD10-I70.0). Electronically Signed   By: MKeith RakeM.D.   On: 04/28/2018 03:39   EKG Interpretation  Date/Time:  Thursday April 27 2018 23:41:43 EDT Ventricular Rate:  89 PR Interval:    QRS Duration: 147 QT Interval:  418 QTC Calculation: 509 R Axis:   -76 Text Interpretation:  Sinus rhythm Ventricular premature complex Right bundle branch block Inferior infarct, old No significant change since last tracing Confirmed by POrpah [phone] on 04/28/2018 12:06:57 AM   Pending Labs Unresulted Labs (From admission, onward)    Start     Ordered   05/05/18 0500  Creatinine, serum  (enoxaparin (LOVENOX)    CrCl >/= 30 ml/min)  Weekly,   R    Comments:  while on enoxaparin therapy    04/28/18 0516   04/29/18 01191 Basic metabolic panel  Tomorrow morning,   R     04/28/18 0516   04/29/18 0500  CBC  Tomorrow morning,   R     04/28/18 0516   04/28/18 0515  Troponin I  Now then every 6 hours,   R     04/28/18 0516   04/28/18 0515  Magnesium  Once,   R     04/28/18 0516   04/28/18 0513  CBC  (enoxaparin (LOVENOX)    CrCl >/= 30 ml/min)  Once,   R    Comments:  Baseline for enoxaparin therapy IF NOT ALREADY DRAWN.  Notify MD if PLT < 100 K.    04/28/18 0516   04/28/18 0513  Creatinine, serum  (enoxaparin (LOVENOX)    CrCl >/= 30 ml/min)  Once,   R    Comments:  Baseline for enoxaparin therapy IF NOT ALREADY DRAWN.    04/28/18 0516   04/28/18 0000  CBC with Differential/Platelet  STAT,   R     04/27/18 2359  Vitals/Pain Today's Vitals   04/28/18 0405 04/28/18 0430 04/28/18 0500 04/28/18 0502  BP: (!) 151/100 (!) 168/104 (!)  163/102 (!) 163/102  Pulse: 80 81 89 78  Resp: 17 19 (!) 22 (!) 21  Temp:      TempSrc:      SpO2: 94% 95% 92% 93%  PainSc:        Isolation Precautions No active isolations  Medications Medications  sodium chloride 0.9 % injection (has no administration in time range)  iopamidol (ISOVUE-370) 76 % injection (has no administration in time range)  amLODipine (NORVASC) tablet 5 mg (has no administration in time range)  lisinopril (PRINIVIL,ZESTRIL) tablet 20 mg (has no administration in time range)  alfuzosin (UROXATRAL) 24 hr tablet 10 mg (has no administration in time range)  acetaminophen (TYLENOL) tablet 650 mg (has no administration in time range)    Or  acetaminophen (TYLENOL) suppository 650 mg (has no administration in time range)  ondansetron (ZOFRAN) tablet 4 mg (has no administration in time range)    Or  ondansetron (ZOFRAN) injection 4 mg (has no administration in time range)  enoxaparin (LOVENOX) injection 40 mg (has no administration in time range)  hydrALAZINE (APRESOLINE) injection 5 mg (has no administration in time range)  furosemide (LASIX) injection 40 mg (has no administration in time range)  potassium chloride SA (K-DUR,KLOR-CON) CR tablet 20 mEq (has no administration in time range)  nitroGLYCERIN (NITROSTAT) SL tablet 0.4 mg (0.4 mg Sublingual Given 04/28/18 0100)  nitroGLYCERIN (NITROGLYN) 2 % ointment 1 inch (1 inch Topical Given 04/28/18 0100)  furosemide (LASIX) injection 40 mg (40 mg Intravenous Given 04/28/18 0100)  iopamidol (ISOVUE-370) 76 % injection 100 mL (100 mLs Intravenous Contrast Given 04/28/18 0322)    Mobility walks

## 2018-04-28 NOTE — Progress Notes (Signed)
CRITICAL VALUE ALERT  Critical Value:  Troponin  Date & Time Notied:  04/28/18 0728  Provider Notified: Dr Cathlean Sauer  Orders Received/Actions taken: pending return call

## 2018-04-28 NOTE — Care Management Obs Status (Signed)
MEDICARE OBSERVATION STATUS NOTIFICATION   Patient Details  Name: [name] MRN: [id number] Date of Birth: 1934-08-30   Medicare Observation Status Notification Given:  Yes    Leeroy Cha, RN 04/28/2018, 2:09 PM

## 2018-04-28 NOTE — ED Notes (Signed)
Delay in transporting patient to floor d/t radiology CT order.

## 2018-04-28 NOTE — ED Notes (Signed)
Patient transported to CT 

## 2018-04-28 NOTE — Progress Notes (Signed)
PROGRESS NOTE    Bay Wayson  TMH:[id number] DOB: 21-May-1935 DOA: 04/27/2018 PCP: [name], MD Outpatient Specialists:     Brief Narrative: Yash Cacciola is 83 y.o admitted for CHF. Has a history of prostate cancer hypertension, hyperlipidemia, diastolic CHF with EF of 55 to 60%.  Has been feeling SOB with exertion and on lying flat. Patient has not been taking one of his hypertensive medication. He has been having severe headaches and occasional epistaxis in the last couple of weeks. In the ED, BNP was 248. CT angiogram of the chest showed ascending aortic aneurysm of 4.1 cm and features concerning for elevated right heart pressure. On admission, patient BP was elevated with systolic 921 and diastolic 194.     Assessment & Plan:   Principal Problem:   Acute on chronic diastolic CHF (congestive heart failure) (HCC) Active Problems:   Hx of Prostate cancer   Hypertensive urgency   Acute CHF (congestive heart failure) ([place])  1. Acute on chronic diastolic CHF: Likely secondary to not taking hypertensive medication. EF done in 2016 wqs 55 to 60%. Patient still have SOB, mild leg swelling and abdomenal distension. Urine output -1330 with in the last 24 hrs. Continue Lasix 40 mg IV BID.   2.Hypertensive urgency: Secondary to not taking hypertensive medication and CHF. BP is trending down. Last reported 148/78. Continue on lisinopril 5mg  daily, amlodipine 5mg  daily, and hydralaxine PRN. Monitor BP levels. CT head pending.  3.Occipital HA with mild epistaxis: epistaxis has resolved, still has persistent headache. Acetaminophen 650 mg PRN  4.DM2: mentioned on chart, on no medication. A1C is 7.5. Follow up with PCP.  5.Hyperlipidemia: mentioned on chart, on no medication. Follow up with PCP.    DVT prophylaxis:  Lovenox  Code Status:   Full  Family Communication:   Patient's friend present at bed side  Disposition Plan:  Will discharge once BP level  stabilize.  Consultants:    Procedures: Antimicrobials: None  Subjective: Patient indicated that his breathing is getting better than yesterday, but not at baseline. Still has persistent headache. Denied chest pain, nausea, and vomiting.  Objective: Vitals:   04/28/18 0430 04/28/18 0500 04/28/18 0502 04/28/18 0547  BP: (!) 168/104 (!) 163/102 (!) 163/102 (!) 148/78  Pulse: 81 89 78 86  Resp: 19 (!) 22 (!) 21 20  Temp:    98 F (36.7 C)  TempSrc:    Oral  SpO2: 95% 92% 93% 96%  Weight:    111.1 kg  Height:    6' (1.829 m)    Intake/Output Summary (Last 24 hours) at 04/28/2018 1200 Last data filed at 04/28/2018 0945 Gross per 24 hour  Intake 120 ml  Output 1450 ml  Net -1330 ml   Filed Weights   04/28/18 0547  Weight: 111.1 kg    Examination:  General exam: Appears with mild discomfort Respiratory system: Clear to auscultation. Respiratory effort normal. Cardiovascular system: S1 & S2 heard, RRR. No JVD, murmurs, rubs, gallops or clicks. 1+ edema. Gastrointestinal system: Abdomen is nondistended, soft and nontender. No organomegaly or masses felt. Normal bowel sounds heard. Central nervous system: Alert and oriented. No focal neurological deficits. Extremities: Symmetric 5 x 5 power. Skin: No rashes, lesions or ulcers Psychiatry: Judgement and insight appear normal. Mood & affect appropriate.     Data Reviewed: I have personally reviewed following labs and imaging studies  CBC: Recent Labs  Lab 04/28/18 0055 04/28/18 0618  WBC 6.1 6.3  NEUTROABS 3.4  --  HGB 12.7* 13.0  HCT 40.4 41.3  MCV 92.9 92.2  PLT 160 295   Basic Metabolic Panel: Recent Labs  Lab 04/28/18 0017 04/28/18 0618  NA 140  --   K 4.0  --   CL 104  --   CO2 24  --   GLUCOSE 197*  --   BUN 17  --   CREATININE 0.86 0.99  CALCIUM 8.7*  --   MG  --  2.3   GFR: Estimated Creatinine Clearance: 75.3 mL/min (by C-G formula based on SCr of 0.99 mg/dL). Liver Function Tests: Recent  Labs  Lab 04/28/18 0017  AST 48*  ALT 50*  ALKPHOS 72  BILITOT 0.7  PROT 7.1  ALBUMIN 3.6   No results for input(s): LIPASE, AMYLASE in the last 168 hours. No results for input(s): AMMONIA in the last 168 hours. Coagulation Profile: No results for input(s): INR, PROTIME in the last 168 hours. Cardiac Enzymes: Recent Labs  Lab 04/28/18 0618  TROPONINI 0.07*   BNP (last 3 results) No results for input(s): PROBNP in the last 8760 hours. HbA1C: Recent Labs    04/28/18 0618  HGBA1C 7.5*   CBG: No results for input(s): GLUCAP in the last 168 hours. Lipid Profile: No results for input(s): CHOL, HDL, LDLCALC, TRIG, CHOLHDL, LDLDIRECT in the last 72 hours. Thyroid Function Tests: No results for input(s): TSH, T4TOTAL, FREET4, T3FREE, THYROIDAB in the last 72 hours. Anemia Panel: No results for input(s): VITAMINB12, FOLATE, FERRITIN, TIBC, IRON, RETICCTPCT in the last 72 hours. Urine analysis:    Component Value Date/Time   COLORURINE YELLOW 11/25/2008 0201   APPEARANCEUR CLEAR 11/25/2008 0201   LABSPEC 1.017 11/25/2008 0201   PHURINE 5.5 11/25/2008 0201   GLUCOSEU NEGATIVE 11/25/2008 0201   HGBUR LARGE (A) 11/25/2008 0201   BILIRUBINUR NEGATIVE 11/25/2008 0201   KETONESUR NEGATIVE 11/25/2008 0201   PROTEINUR NEGATIVE 11/25/2008 0201   UROBILINOGEN 0.2 11/25/2008 0201   NITRITE NEGATIVE 11/25/2008 0201   LEUKOCYTESUR NEGATIVE 11/25/2008 0201   Sepsis Labs: @LABRCNTIP (procalcitonin:4,lacticidven:4)  )No results found for this or any previous visit (from the past 240 hour(s)).       Radiology Studies: Dg Chest 2 View  Result Date: 04/28/2018 CLINICAL DATA:  Shortness of breath. Symptoms for 1 week, worsening. EXAM: CHEST - 2 VIEW COMPARISON:  Radiograph 07/19/2015. FINDINGS: Unchanged cardiomegaly and tortuous thoracic aorta. Mild vascular congestion. Suspect early pulmonary edema with faint Kerley B-lines at the lung bases. Trace pleural effusions. No confluent  airspace disease. No pneumothorax. No acute osseous abnormalities. IMPRESSION: 1. Unchanged cardiomegaly and tortuous thoracic aorta. 2. Vascular congestion with suspected mild pulmonary edema. Tiny pleural effusions. Electronically Signed   By: Nekoda Rake M.D.   On: 04/28/2018 00:04   Ct Angio Chest Pe W Or Wo Contrast  Result Date: 04/28/2018 CLINICAL DATA:  Shortness of breath. EXAM: CT ANGIOGRAPHY CHEST WITH CONTRAST TECHNIQUE: Multidetector CT imaging of the chest was performed using the standard protocol during bolus administration of intravenous contrast. Multiplanar CT image reconstructions and MIPs were obtained to evaluate the vascular anatomy. CONTRAST:  148mL ISOVUE-370 IOPAMIDOL (ISOVUE-370) INJECTION 76% COMPARISON:  Radiograph yesterday.  Chest CT 04/26/2005 FINDINGS: Cardiovascular: There are no filling defects within the pulmonary arteries to suggest pulmonary embolus. Multi chamber cardiomegaly with contrast refluxing into the hepatic veins and IVC. Diffuse aortic atherosclerosis and tortuosity. Fusiform aneurysmal dilatation of the ascending aorta, 4.1 cm. No periaortic stranding. Limited assessment for dissection given phase of contrast tailored to pulmonary artery evaluation.  There are coronary artery calcifications. Mediastinum/Nodes: Multiple small mediastinal and hilar nodes, not enlarged by size criteria. The esophagus is decompressed. No visualized thyroid nodule. Lungs/Pleura: No confluent airspace disease. Mild basilar congestive changes and bronchial thickening. Scattered atelectasis. Motion artifact limits detailed assessment. No significant pleural effusion. No pulmonary mass. Trachea and mainstem bronchi are patent. Upper Abdomen: Contrast refluxes into the hepatic veins and IVC. Atherosclerosis of included upper abdominal vasculature. Surgical clips at the gastroesophageal junction. Musculoskeletal: No focal osseous lesion or acute abnormality. Mild degenerative change in  the spine. Review of the MIP images confirms the above findings. IMPRESSION: 1. No pulmonary embolus. 2. Cardiomegaly with elevated right heart pressures. Mild vascular congestion. 3. Aortic atherosclerosis and tortuosity. Fusiform ascending aneurysmal dilatation, maximal dimension 4.1 cm. Recommend annual imaging followup by CTA or MRA. This recommendation follows 2010 ACCF/AHA/AATS/ACR/ASA/SCA/SCAI/SIR/STS/SVM Guidelines for the Diagnosis and Management of Patients with Thoracic Aortic Disease. Circulation. 2010; 121: T654-Y503 4. Dense coronary artery calcifications versus stents. Aortic Atherosclerosis (ICD10-I70.0). Electronically Signed   By: Stran Rake M.D.   On: 04/28/2018 03:39        Scheduled Meds: . alfuzosin  10 mg Oral Daily  . amLODipine  5 mg Oral Daily  . enoxaparin (LOVENOX) injection  40 mg Subcutaneous Q24H  . furosemide  40 mg Intravenous Q12H  . iopamidol      . lisinopril  20 mg Oral Daily  . potassium chloride  20 mEq Oral Daily  . sodium chloride       Continuous Infusions:   LOS: 0 days    Time spent:     Sterling Big, MD [facility] Pager [phone]  If 7PM-7AM, please contact night-coverage [email or web address] Password [email or web address] 04/28/2018, 12:00 PM

## 2018-04-28 NOTE — H&P (Signed)
History and Physical    [name] UXL:[id number] DOB: December 18, 1934 DOA: 04/27/2018  PCP: [name], MD  Patient coming from: Home.  Chief Complaint: Shortness of breath.  HPI: [name] is a 83 y.o. male with history of hypertension, hyperlipidemia, diastolic dysfunction per 2D echo done in April 2016 with EF of 55 to 60% presents to the ER with complaints of increasing shortness of breath over the last few days.  Patient states he gets short of breath on exertion and on lying flat.  Sometimes gets anxious.  Denies any chest pain.  Has noticed increasing abdominal girth.  Has been taking his lisinopril but has not been taking his amlodipine until last 3 days when he noticed increasing headache in the occipital area with epistaxis.  He denies any weakness of the extremities or any visual symptoms.  ED Course: In the ER labs done showed BNP of 248 troponin was negative.  CT angiogram of the chest done shows ascending aortic aneurysm of 4.1 cm and features concerning for elevated right heart pressure.  Patient on admission also was found to be having markedly elevated blood pressure with systolic blood pressure in the 215.  Diastolic 536.  Patient was given Lasix 40 mg IV and admitted for acute on chronic diastolic CHF with hypertensive urgency.  Review of Systems: As per HPI, rest all negative.   Past Medical History:  Diagnosis Date  . Abnormal PSA 01/17/2012   10.38/4/5/ PSA:2013=6.96/ PSA 07/08/2011=6.26/ PSA 2006=8.6  . Blood transfusion without reported diagnosis    during vagotomy 1976  . Cancer [facility]) 2006   L-2 solitary metastatic lesion/no rad tx ,asymptomatic  . Chronic kidney disease    nephrolithiasis right kidney  . Gynecomastia 04/21/11   seen by Dr.Murray, no rad tx  . History of echocardiogram    echo 4/16:  mild LVH, EF 55-60%, Gr 1 DD, no RWMA, mild AI, mild MR, mod LAE, mild TR  . Hx of cardiovascular stress test    Myoview 5/16:  EF 50%, no scar or ischemia, low  risk  . Hydroureteronephrosis    right   . Hypertension   . Osteopenia   . Overweight   . Prostate cancer ([place]) 12/09/03 dx   Prostate ca/adenocarcinoma,gleason=3+3=6    pSA 4.6  . Sleep apnea    pt unaware  . Stroke [facility])    asymptomatic, found on head imaging  . Ulcer    peptic  . Use of leuprolide acetate (Lupron) 06/2005-11/2006   02/2008 degarelix 02/2008 x 3 doses    Past Surgical History:  Procedure Laterality Date  . PROSTATE BIOPSY  12/09/03   adenocarcinoma,glerason: 3+3=6  . VAGOTOMY  1978   partial     reports that he quit smoking about 33 years ago. His smoking use included pipe. He has never used smokeless tobacco. He reports that he does not drink alcohol or use drugs.  No Known Allergies  Family History  Problem Relation Age of Onset  . Colon cancer Neg Hx   . Stroke Neg Hx     Prior to Admission medications   Medication Sig Start Date End Date Taking? Authorizing Provider  alfuzosin (UROXATRAL) 10 MG 24 hr tablet Take 10 mg by mouth daily. 03/08/18  Yes [provider]  amLODipine (NORVASC) 5 MG tablet Take 5 mg by mouth daily. 04/25/18  Yes [provider]  Calcium-Vitamin D (CALTRATE 600 PLUS-VIT D PO) Take 1 tablet by mouth daily.    Yes [provider]  lisinopril (PRINIVIL,ZESTRIL) 20 MG tablet Take 20 mg by mouth daily. 02/06/18  Yes [provider]    Physical Exam: Vitals:   04/28/18 0405 04/28/18 0430 04/28/18 0500 04/28/18 0502  BP: (!) 151/100 (!) 168/104 (!) 163/102 (!) 163/102  Pulse: 80 81 89 78  Resp: 17 19 (!) 22 (!) 21  Temp:      TempSrc:      SpO2: 94% 95% 92% 93%      Constitutional: Moderately built and nourished. Vitals:   04/28/18 0405 04/28/18 0430 04/28/18 0500 04/28/18 0502  BP: (!) 151/100 (!) 168/104 (!) 163/102 (!) 163/102  Pulse: 80 81 89 78  Resp: 17 19 (!) 22 (!) 21  Temp:      TempSrc:      SpO2: 94% 95% 92% 93%   Eyes: Anicteric no pallor. ENMT: No discharge from the ears  eyes nose or mouth. Neck: JVD mildly elevated no mass felt. Respiratory: No rhonchi or crepitations. Cardiovascular: S1-S2 heard no murmurs appreciated. Abdomen: Soft mildly distended nontender no guarding or rigidity. Musculoskeletal: Mild edema of the both lower extremities. Skin: No rash. Neurologic: Alert awake oriented to time place and person.  Moves all extremities. Psychiatric: Appears normal.  Normal affect.   Labs on Admission: I have personally reviewed following labs and imaging studies  CBC: Recent Labs  Lab 04/28/18 0055  WBC 6.1  NEUTROABS 3.4  HGB 12.7*  HCT 40.4  MCV 92.9  PLT 119   Basic Metabolic Panel: Recent Labs  Lab 04/28/18 0017  NA 140  K 4.0  CL 104  CO2 24  GLUCOSE 197*  BUN 17  CREATININE 0.86  CALCIUM 8.7*   GFR: CrCl cannot be calculated (Unknown ideal weight.). Liver Function Tests: Recent Labs  Lab 04/28/18 0017  AST 48*  ALT 50*  ALKPHOS 72  BILITOT 0.7  PROT 7.1  ALBUMIN 3.6   No results for input(s): LIPASE, AMYLASE in the last 168 hours. No results for input(s): AMMONIA in the last 168 hours. Coagulation Profile: No results for input(s): INR, PROTIME in the last 168 hours. Cardiac Enzymes: No results for input(s): CKTOTAL, CKMB, CKMBINDEX, TROPONINI in the last 168 hours. BNP (last 3 results) No results for input(s): PROBNP in the last 8760 hours. HbA1C: No results for input(s): HGBA1C in the last 72 hours. CBG: No results for input(s): GLUCAP in the last 168 hours. Lipid Profile: No results for input(s): CHOL, HDL, LDLCALC, TRIG, CHOLHDL, LDLDIRECT in the last 72 hours. Thyroid Function Tests: No results for input(s): TSH, T4TOTAL, FREET4, T3FREE, THYROIDAB in the last 72 hours. Anemia Panel: No results for input(s): VITAMINB12, FOLATE, FERRITIN, TIBC, IRON, RETICCTPCT in the last 72 hours. Urine analysis:    Component Value Date/Time   COLORURINE YELLOW 11/25/2008 0201   APPEARANCEUR CLEAR 11/25/2008 0201    LABSPEC 1.017 11/25/2008 0201   PHURINE 5.5 11/25/2008 0201   GLUCOSEU NEGATIVE 11/25/2008 0201   HGBUR LARGE (A) 11/25/2008 0201   BILIRUBINUR NEGATIVE 11/25/2008 0201   KETONESUR NEGATIVE 11/25/2008 0201   PROTEINUR NEGATIVE 11/25/2008 0201   UROBILINOGEN 0.2 11/25/2008 0201   NITRITE NEGATIVE 11/25/2008 0201   LEUKOCYTESUR NEGATIVE 11/25/2008 0201   Sepsis Labs: @LABRCNTIP (procalcitonin:4,lacticidven:4) )No results found for this or any previous visit (from the past 240 hour(s)).   Radiological Exams on Admission: Dg Chest 2 View  Result Date: 04/28/2018 CLINICAL DATA:  Shortness of breath. Symptoms for 1 week, worsening. EXAM: CHEST - 2 VIEW COMPARISON:  Radiograph 07/19/2015. FINDINGS:  Unchanged cardiomegaly and tortuous thoracic aorta. Mild vascular congestion. Suspect early pulmonary edema with faint Kerley B-lines at the lung bases. Trace pleural effusions. No confluent airspace disease. No pneumothorax. No acute osseous abnormalities. IMPRESSION: 1. Unchanged cardiomegaly and tortuous thoracic aorta. 2. Vascular congestion with suspected mild pulmonary edema. Tiny pleural effusions. Electronically Signed   By: Adebayo Rake M.D.   On: 04/28/2018 00:04   Ct Angio Chest Pe W Or Wo Contrast  Result Date: 04/28/2018 CLINICAL DATA:  Shortness of breath. EXAM: CT ANGIOGRAPHY CHEST WITH CONTRAST TECHNIQUE: Multidetector CT imaging of the chest was performed using the standard protocol during bolus administration of intravenous contrast. Multiplanar CT image reconstructions and MIPs were obtained to evaluate the vascular anatomy. CONTRAST:  146mL ISOVUE-370 IOPAMIDOL (ISOVUE-370) INJECTION 76% COMPARISON:  Radiograph yesterday.  Chest CT 04/26/2005 FINDINGS: Cardiovascular: There are no filling defects within the pulmonary arteries to suggest pulmonary embolus. Multi chamber cardiomegaly with contrast refluxing into the hepatic veins and IVC. Diffuse aortic atherosclerosis and tortuosity.  Fusiform aneurysmal dilatation of the ascending aorta, 4.1 cm. No periaortic stranding. Limited assessment for dissection given phase of contrast tailored to pulmonary artery evaluation. There are coronary artery calcifications. Mediastinum/Nodes: Multiple small mediastinal and hilar nodes, not enlarged by size criteria. The esophagus is decompressed. No visualized thyroid nodule. Lungs/Pleura: No confluent airspace disease. Mild basilar congestive changes and bronchial thickening. Scattered atelectasis. Motion artifact limits detailed assessment. No significant pleural effusion. No pulmonary mass. Trachea and mainstem bronchi are patent. Upper Abdomen: Contrast refluxes into the hepatic veins and IVC. Atherosclerosis of included upper abdominal vasculature. Surgical clips at the gastroesophageal junction. Musculoskeletal: No focal osseous lesion or acute abnormality. Mild degenerative change in the spine. Review of the MIP images confirms the above findings. IMPRESSION: 1. No pulmonary embolus. 2. Cardiomegaly with elevated right heart pressures. Mild vascular congestion. 3. Aortic atherosclerosis and tortuosity. Fusiform ascending aneurysmal dilatation, maximal dimension 4.1 cm. Recommend annual imaging followup by CTA or MRA. This recommendation follows 2010 ACCF/AHA/AATS/ACR/ASA/SCA/SCAI/SIR/STS/SVM Guidelines for the Diagnosis and Management of Patients with Thoracic Aortic Disease. Circulation. 2010; 121: W413-K440 4. Dense coronary artery calcifications versus stents. Aortic Atherosclerosis (ICD10-I70.0). Electronically Signed   By: Jamel Rake M.D.   On: 04/28/2018 03:39    EKG: Independently reviewed.  Normal sinus rhythm with RBBB.  Assessment/Plan Principal Problem:   Acute on chronic diastolic CHF (congestive heart failure) (HCC) Active Problems:   Hx of Prostate cancer   Hypertensive urgency   Acute CHF (congestive heart failure) ([place])    1. Acute on chronic diastolic CHF last EF  measured in 2016 was 55 to 60% with grade 1 diastolic dysfunction -patient has been placed on Lasix 40 mg IV every 12.  Closely follow intake output metabolic panel and daily weights. 2. Hypertensive urgency likely contributing to #1.  Patient has been continued on lisinopril and amlodipine.  Patient is presently on Lasix 2.  PRN IV hydralazine.  Closely follow blood pressure trends. 3. Occipital headache with mild epistaxis -epistaxis have resolved.  Occipital headache has improved but still persist.  Nonfocal appearing.  CT head is pending. 4. History of hyperlipidemia -patient states he does not like to take the statins. 5. He had prostate cancer. 6. History of diabetes mellitus type 2 per the chart patient not on any medication.  Will check hemoglobin A1c.   DVT prophylaxis: Lovenox if CT head is negative for bleed. Code Status: Full code.  Family Communication: Discussed with patient. Disposition Plan: Home. Consults called: None. Admission  status: Observation.   [name] MD [facility] Pager [phone].  If 7PM-7AM, please contact night-coverage [email or web address] Password [email or web address]  04/28/2018, 5:16 AM

## 2018-04-29 ENCOUNTER — Observation Stay (HOSPITAL_BASED_OUTPATIENT_CLINIC_OR_DEPARTMENT_OTHER): Payer: Medicare Other

## 2018-04-29 DIAGNOSIS — I5030 Unspecified diastolic (congestive) heart failure: Secondary | ICD-10-CM | POA: Diagnosis not present

## 2018-04-29 DIAGNOSIS — E1169 Type 2 diabetes mellitus with other specified complication: Secondary | ICD-10-CM

## 2018-04-29 DIAGNOSIS — I5031 Acute diastolic (congestive) heart failure: Secondary | ICD-10-CM | POA: Diagnosis not present

## 2018-04-29 DIAGNOSIS — C61 Malignant neoplasm of prostate: Secondary | ICD-10-CM | POA: Diagnosis not present

## 2018-04-29 DIAGNOSIS — I160 Hypertensive urgency: Secondary | ICD-10-CM | POA: Diagnosis not present

## 2018-04-29 DIAGNOSIS — I5033 Acute on chronic diastolic (congestive) heart failure: Secondary | ICD-10-CM | POA: Diagnosis not present

## 2018-04-29 LAB — BASIC METABOLIC PANEL
ANION GAP: 9 (ref 5–15)
BUN: 17 mg/dL (ref 8–23)
CHLORIDE: 104 mmol/L (ref 98–111)
CO2: 28 mmol/L (ref 22–32)
Calcium: 8.4 mg/dL — ABNORMAL LOW (ref 8.9–10.3)
Creatinine, Ser: 0.91 mg/dL (ref 0.61–1.24)
GFR calc Af Amer: >60 mL/min (ref >60)
Glucose, Bld: 159 mg/dL — ABNORMAL HIGH (ref 70–99)
POTASSIUM: 3.5 mmol/L (ref 3.5–5.1)
Sodium: 141 mmol/L (ref 135–145)

## 2018-04-29 LAB — CBC
HEMATOCRIT: 39.2 % (ref 39.0–52.0)
HEMOGLOBIN: 12.3 g/dL — AB (ref 13.0–17.0)
MCH: 29 pg (ref 26.0–34.0)
MCHC: 31.4 g/dL (ref 30.0–36.0)
MCV: 92.5 fL (ref 80.0–100.0)
Platelets: 176 10*3/uL (ref 150–400)
RBC: 4.24 MIL/uL (ref 4.22–5.81)
RDW: 14.6 % (ref 11.5–15.5)
WBC: 7.1 10*3/uL (ref 4.0–10.5)
nRBC: 0 % (ref 0.0–0.2)

## 2018-04-29 LAB — ECHOCARDIOGRAM COMPLETE
Height: 72 in
Weight: 3827.2 oz

## 2018-04-29 MED ORDER — FUROSEMIDE 20 MG PO TABS: 20.0000 mg | ORAL_TABLET | Freq: Every day | ORAL | 0 refills | Status: DC | PRN

## 2018-04-29 MED ORDER — ATORVASTATIN CALCIUM 20 MG PO TABS: 20.0000 mg | ORAL_TABLET | Freq: Every day | ORAL | 0 refills | Status: DC

## 2018-04-29 MED ORDER — METFORMIN HCL 500 MG PO TABS: 500.0000 mg | ORAL_TABLET | Freq: Two times a day (BID) | ORAL | 0 refills | Status: DC

## 2018-04-29 MED ORDER — AMLODIPINE BESYLATE 10 MG PO TABS: 10.0000 mg | ORAL_TABLET | Freq: Every day | ORAL | 0 refills | Status: DC

## 2018-04-29 MED ORDER — POTASSIUM CHLORIDE CRYS ER 20 MEQ PO TBCR: 20.0000 meq | EXTENDED_RELEASE_TABLET | Freq: Every day | ORAL | 0 refills | Status: DC | PRN

## 2018-04-29 MED ORDER — GABAPENTIN 100 MG PO CAPS: 100.0000 mg | ORAL_CAPSULE | Freq: Three times a day (TID) | ORAL | 0 refills | Status: DC

## 2018-04-29 NOTE — Progress Notes (Signed)
Echocardiogram 2D Echocardiogram has been performed.  [name] 04/29/2018, 9:39 AM

## 2018-04-29 NOTE — Discharge Summary (Addendum)
Physician Discharge Summary  [name] HCW:[id number] DOB: 1935/05/05 DOA: 04/27/2018  PCP: Calvin Baton, MD  Admit date: 04/27/2018 Discharge date: 04/29/2018  Admitted From: Home  Disposition:  Home   Recommendations for Outpatient Follow-up and new medication changes:  1. Follow up with Dr. Virgina Mullins in 7 days. 2. Amlodipine has been increased to 10 mg daily. 3. Added as needed furosemide for edema or weight (gain 3 lbs in 24 hours or 5 lbs in seven days).  4. Hemoglobin A1c 7.5, started on metformin 500 mg po bid. 5. Patient has been placed on low-dose atorvastatin for secondary prophylaxis.  6. Added gabapentin for peripheral neuropathy.  7. Annual follow up chest CT or MRA, for aneurysmal dilatation of ascending aorta. (4.1 cm).   Home Health: no   Equipment/Devices: no    Discharge Condition: stable  CODE STATUS: full   Diet recommendation:  Heart healthy   Brief/Interim Summary: 83 year old male who presented with dyspnea, he does have the significant past medical history for hypertension, dyslipidemia and diastolic heart failure. Reported progressive dyspnea and orthopnea, no chest pain but increased abdominal girth.  He has been not taking his antihypertensive as prescribed for last 3 days prior to hospitalization.  On his initial physical examination blood pressure 151/100, heart rate 80, respiratory rate 22, oxygen saturation 92%, positive JVD, lungs with no crackles, heart S1-S2 present rhythmic, abdomen distended, nontender, positive lower extremity edema.  Sodium 140, potassium 4.0, chloride 104, bicarb 24, glucose 197, BUN 17, creatinine 0.86, BNP 248, troponin I 0.07, white count 6.1, hemoglobin 12.7, hematocrit 40.4, platelets 160.  Chest x-ray with increased vascular congestion.  CT chest negative for pulmonary embolism, positive vascular congestion, aortic ascending aneurysm 4.1 cm dense coronary artery calcifications.  Head CT with no acute changes, old right basal  ganglia lacunar infarct.   Patient was admitted to the hospital with a working diagnosis of acute on chronic diastolic heart failure exacerbation complicated by uncontrolled hypertension/hypertensive emergency.  1.  Acute on chronic diastolic heart failure, present on admission.  Patient was admitted to the medical ward, was placed on intravenous furosemide, negative fluid balance was achieved (- 3,430 ml since admission) with improvement of his symptoms.  Echocardiography still pending report.  Patient will continue blood pressure control with an increased dose of amlodipine, continue lisinopril.  Will prescribe as needed furosemide for volume overload, weight gain 3 pounds in 24 hours or 5 pounds in 7 days.  Take potassium only with furosemide.  He was counseled about medical compliance.  2.  Uncontrolled hypertension/ hypertensive emergency.  He was diuresed with furosemide, his amlodipine has been increased to 10 mg daily, continue taking lisinopril 20 mill grams daily, discharge systolic blood pressure 176 mmHg.  3.  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  No neurologic deficit, likely related to uncontrolled hypertension.  Lipid profile with LDL of 66, HDL 35, total cholesterol 118.  Patient will be discharged on low-dose atorvastatin.  4.  Type 2 diabetes mellitus.  Patient not taking any antihyperglycemic agent, his fasting glucose 197 at discharge, his hemoglobin A1c is 7.5, patient will be discharged on metformin. Positive neuropathic pain on his lower extremities, gabapentin has been added.   Discharge Diagnoses:  Principal Problem:   Acute on chronic diastolic CHF (congestive heart failure) (HCC) Active Problems:   Hx of Prostate cancer   Hypertensive urgency   Acute CHF (congestive heart failure) ([place])    Discharge Instructions   Allergies as of 04/29/2018   No Known Allergies  Medication List    TAKE these medications   alfuzosin 10 MG 24 hr tablet Commonly known as:  UROXATRAL Take 10  mg by mouth daily.   amLODipine 10 MG tablet Commonly known as:  NORVASC Take 1 tablet (10 mg total) by mouth daily. What changed:    medication strength  how much to take   atorvastatin 20 MG tablet Commonly known as:  LIPITOR Take 1 tablet (20 mg total) by mouth daily.   CALTRATE 600 PLUS-VIT D PO Take 1 tablet by mouth daily.   furosemide 20 MG tablet Commonly known as:  LASIX Take 1 tablet (20 mg total) by mouth daily as needed for up to 20 days for fluid or edema (as needed in case of swelling, or weight gain 3 lbs in 24 hours or 5lbs in seven days.).   lisinopril 20 MG tablet Commonly known as:  PRINIVIL,ZESTRIL Take 20 mg by mouth daily.   metFORMIN 500 MG tablet Commonly known as:  GLUCOPHAGE Take 1 tablet (500 mg total) by mouth 2 (two) times daily with a meal.   potassium chloride SA 20 MEQ tablet Commonly known as:  K-DUR,KLOR-CON Take 1 tablet (20 mEq total) by mouth daily as needed for up to 20 days (Take only if taking furosemide (Lasix)).       No Known Allergies  Consultations:     Procedures/Studies: Dg Chest 2 View  Result Date: 04/28/2018 CLINICAL DATA:  Shortness of breath. Symptoms for 1 week, worsening. EXAM: CHEST - 2 VIEW COMPARISON:  Radiograph 07/19/2015. FINDINGS: Unchanged cardiomegaly and tortuous thoracic aorta. Mild vascular congestion. Suspect early pulmonary edema with faint Kerley B-lines at the lung bases. Trace pleural effusions. No confluent airspace disease. No pneumothorax. No acute osseous abnormalities. IMPRESSION: 1. Unchanged cardiomegaly and tortuous thoracic aorta. 2. Vascular congestion with suspected mild pulmonary edema. Tiny pleural effusions. Electronically Signed   By: Atif Rake M.D.   On: 04/28/2018 00:04   Ct Head Wo Contrast  Result Date: 04/28/2018 CLINICAL DATA:  83 year old male with a history headache EXAM: CT HEAD WITHOUT CONTRAST TECHNIQUE: Contiguous axial images were obtained from the base of the  skull through the vertex without intravenous contrast. COMPARISON:  MR 10/20/2014, CT 10/20/2014 FINDINGS: Brain: No acute intracranial hemorrhage. No midline shift or mass effect. Unremarkable configuration the ventricles. Mild hypodensity in the periventricular white matter. Focal hypodensity in the right basal ganglia and left basal ganglia. The right-sided hypodensity is new from the comparison CT and MR. Gray-white differentiation maintained. Vascular: Vascular calcifications. Skull: No acute bony abnormality. Sinuses/Orbits: No acute finding. Other: None. IMPRESSION: No CT evidence of acute intracranial abnormality. Since the prior imaging of April 2016 there is a new focal hypodensity in the right basal ganglia, compatible with interval development of lacunar infarct. Redemonstration of changes of microvascular ischemic disease. Electronically Signed   By: Corrie Mckusick D.O.   On: 04/28/2018 15:26   Ct Angio Chest Pe W Or Wo Contrast  Result Date: 04/28/2018 CLINICAL DATA:  Shortness of breath. EXAM: CT ANGIOGRAPHY CHEST WITH CONTRAST TECHNIQUE: Multidetector CT imaging of the chest was performed using the standard protocol during bolus administration of intravenous contrast. Multiplanar CT image reconstructions and MIPs were obtained to evaluate the vascular anatomy. CONTRAST:  173mL ISOVUE-370 IOPAMIDOL (ISOVUE-370) INJECTION 76% COMPARISON:  Radiograph yesterday.  Chest CT 04/26/2005 FINDINGS: Cardiovascular: There are no filling defects within the pulmonary arteries to suggest pulmonary embolus. Multi chamber cardiomegaly with contrast refluxing into the hepatic veins and IVC. Diffuse  aortic atherosclerosis and tortuosity. Fusiform aneurysmal dilatation of the ascending aorta, 4.1 cm. No periaortic stranding. Limited assessment for dissection given phase of contrast tailored to pulmonary artery evaluation. There are coronary artery calcifications. Mediastinum/Nodes: Multiple small mediastinal and  hilar nodes, not enlarged by size criteria. The esophagus is decompressed. No visualized thyroid nodule. Lungs/Pleura: No confluent airspace disease. Mild basilar congestive changes and bronchial thickening. Scattered atelectasis. Motion artifact limits detailed assessment. No significant pleural effusion. No pulmonary mass. Trachea and mainstem bronchi are patent. Upper Abdomen: Contrast refluxes into the hepatic veins and IVC. Atherosclerosis of included upper abdominal vasculature. Surgical clips at the gastroesophageal junction. Musculoskeletal: No focal osseous lesion or acute abnormality. Mild degenerative change in the spine. Review of the MIP images confirms the above findings. IMPRESSION: 1. No pulmonary embolus. 2. Cardiomegaly with elevated right heart pressures. Mild vascular congestion. 3. Aortic atherosclerosis and tortuosity. Fusiform ascending aneurysmal dilatation, maximal dimension 4.1 cm. Recommend annual imaging followup by CTA or MRA. This recommendation follows 2010 ACCF/AHA/AATS/ACR/ASA/SCA/SCAI/SIR/STS/SVM Guidelines for the Diagnosis and Management of Patients with Thoracic Aortic Disease. Circulation. 2010; 121: [id number] 4. Dense coronary artery calcifications versus stents. Aortic Atherosclerosis (ICD10-I70.0). Electronically Signed   By: Bazil Rake M.D.   On: 04/28/2018 03:39       Subjective: Patient is feeling better, no nausea or vomiting, no chest pain, dyspnea is back to baseline.   Discharge Exam: Vitals:   04/29/18 0509 04/29/18 0958  BP: (!) 154/95 (!) 163/104  Pulse: 87 88  Resp: 18   Temp: 98.4 F (36.9 C)   SpO2: 93%    Vitals:   04/28/18 1305 04/28/18 2040 04/29/18 0509 04/29/18 0958  BP: 137/75 135/88 (!) 154/95 (!) 163/104  Pulse: 89 89 87 88  Resp: 20 16 18    Temp: 98.6 F (37 C) 98.7 F (37.1 C) 98.4 F (36.9 C)   TempSrc: Oral Oral Oral   SpO2: 98% 96% 93%   Weight:   108.5 kg   Height:        General: Not in pain or dyspnea   Neurology: Awake and alert, non focal  E ENT: no pallor, no icterus, oral mucosa moist Cardiovascular: No JVD. S1-S2 present, rhythmic, no gallops, rubs, or murmurs. No lower extremity edema. Pulmonary: vesicular breath sounds bilaterally, adequate air movement, no wheezing, rhonchi or rales. Gastrointestinal. Abdomen with no organomegaly, non tender, no rebound or guarding Skin. No rashes Musculoskeletal: no joint deformities   The results of significant diagnostics from this hospitalization (including imaging, microbiology, ancillary and laboratory) are listed below for reference.     Microbiology: No results found for this or any previous visit (from the past 240 hour(s)).   Labs: BNP (last 3 results) Recent Labs    04/28/18 0017  BNP 376.2*   Basic Metabolic Panel: Recent Labs  Lab 04/28/18 0017 04/28/18 0618 04/29/18 0425  NA 140  --  141  K 4.0  --  3.5  CL 104  --  104  CO2 24  --  28  GLUCOSE 197*  --  159*  BUN 17  --  17  CREATININE 0.86 0.99 0.91  CALCIUM 8.7*  --  8.4*  MG  --  2.3  --    Liver Function Tests: Recent Labs  Lab 04/28/18 0017  AST 48*  ALT 50*  ALKPHOS 72  BILITOT 0.7  PROT 7.1  ALBUMIN 3.6   No results for input(s): LIPASE, AMYLASE in the last 168 hours. No results for input(s): AMMONIA  in the last 168 hours. CBC: Recent Labs  Lab 04/28/18 0055 04/28/18 0618 04/29/18 0425  WBC 6.1 6.3 7.1  NEUTROABS 3.4  --   --   HGB 12.7* 13.0 12.3*  HCT 40.4 41.3 39.2  MCV 92.9 92.2 92.5  PLT 160 168 176   Cardiac Enzymes: Recent Labs  Lab 04/28/18 0618 04/28/18 1126 04/28/18 1705  TROPONINI 0.07* 0.07* 0.06*   BNP: Invalid input(s): POCBNP CBG: No results for input(s): GLUCAP in the last 168 hours. D-Dimer No results for input(s): DDIMER in the last 72 hours. Hgb A1c Recent Labs    04/28/18 0618  HGBA1C 7.5*   Lipid Profile No results for input(s): CHOL, HDL, LDLCALC, TRIG, CHOLHDL, LDLDIRECT in the last 72  hours. Thyroid function studies No results for input(s): TSH, T4TOTAL, T3FREE, THYROIDAB in the last 72 hours.  Invalid input(s): FREET3 Anemia work up No results for input(s): VITAMINB12, FOLATE, FERRITIN, TIBC, IRON, RETICCTPCT in the last 72 hours. Urinalysis    Component Value Date/Time   COLORURINE YELLOW 11/25/2008 0201   APPEARANCEUR CLEAR 11/25/2008 0201   LABSPEC 1.017 11/25/2008 0201   PHURINE 5.5 11/25/2008 0201   GLUCOSEU NEGATIVE 11/25/2008 0201   HGBUR LARGE (A) 11/25/2008 0201   BILIRUBINUR NEGATIVE 11/25/2008 0201   KETONESUR NEGATIVE 11/25/2008 0201   PROTEINUR NEGATIVE 11/25/2008 0201   UROBILINOGEN 0.2 11/25/2008 0201   NITRITE NEGATIVE 11/25/2008 0201   LEUKOCYTESUR NEGATIVE 11/25/2008 0201   Sepsis Labs Invalid input(s): PROCALCITONIN,  WBC,  LACTICIDVEN Microbiology No results found for this or any previous visit (from the past 240 hour(s)).   Time coordinating discharge: 45 minutes  SIGNED:   Tawni Millers, MD  [facility] 04/29/2018, 12:21 PM Pager [phone]  If 7PM-7AM, please contact night-coverage [email or web address] Password [email or web address]

## 2018-04-29 NOTE — Plan of Care (Signed)
Pt SOB at rest. Pt given O2 @ 2LPM

## 2018-06-13 ENCOUNTER — Emergency Department (HOSPITAL_COMMUNITY): Payer: Medicare Other

## 2018-06-13 ENCOUNTER — Encounter (HOSPITAL_COMMUNITY): Payer: Self-pay

## 2018-06-13 ENCOUNTER — Other Ambulatory Visit: Payer: Self-pay

## 2018-06-13 ENCOUNTER — Inpatient Hospital Stay (HOSPITAL_COMMUNITY)
Admission: EM | Admit: 2018-06-13 | Discharge: 2018-06-22 | DRG: 308 | Disposition: A | Discharge status: Home / Self Care | Payer: Medicare Other | Source: Ambulatory Visit | Attending: Internal Medicine | Admitting: Internal Medicine

## 2018-06-13 DIAGNOSIS — I4891 Unspecified atrial fibrillation: Secondary | ICD-10-CM | POA: Diagnosis not present

## 2018-06-13 DIAGNOSIS — I083 Combined rheumatic disorders of mitral, aortic and tricuspid valves: Secondary | ICD-10-CM | POA: Diagnosis present

## 2018-06-13 DIAGNOSIS — Z886 Allergy status to analgesic agent status: Secondary | ICD-10-CM

## 2018-06-13 DIAGNOSIS — I4510 Unspecified right bundle-branch block: Secondary | ICD-10-CM | POA: Diagnosis present

## 2018-06-13 DIAGNOSIS — N179 Acute kidney failure, unspecified: Secondary | ICD-10-CM

## 2018-06-13 DIAGNOSIS — E785 Hyperlipidemia, unspecified: Secondary | ICD-10-CM | POA: Diagnosis present

## 2018-06-13 DIAGNOSIS — Z7984 Long term (current) use of oral hypoglycemic drugs: Secondary | ICD-10-CM

## 2018-06-13 DIAGNOSIS — Z8673 Personal history of transient ischemic attack (TIA), and cerebral infarction without residual deficits: Secondary | ICD-10-CM

## 2018-06-13 DIAGNOSIS — Z87442 Personal history of urinary calculi: Secondary | ICD-10-CM

## 2018-06-13 DIAGNOSIS — I10 Essential (primary) hypertension: Secondary | ICD-10-CM | POA: Diagnosis present

## 2018-06-13 DIAGNOSIS — I2721 Secondary pulmonary arterial hypertension: Secondary | ICD-10-CM | POA: Diagnosis present

## 2018-06-13 DIAGNOSIS — M8580 Other specified disorders of bone density and structure, unspecified site: Secondary | ICD-10-CM | POA: Diagnosis present

## 2018-06-13 DIAGNOSIS — Z8546 Personal history of malignant neoplasm of prostate: Secondary | ICD-10-CM

## 2018-06-13 DIAGNOSIS — E119 Type 2 diabetes mellitus without complications: Secondary | ICD-10-CM | POA: Diagnosis not present

## 2018-06-13 DIAGNOSIS — J9601 Acute respiratory failure with hypoxia: Secondary | ICD-10-CM | POA: Diagnosis present

## 2018-06-13 DIAGNOSIS — Z87891 Personal history of nicotine dependence: Secondary | ICD-10-CM

## 2018-06-13 DIAGNOSIS — G4733 Obstructive sleep apnea (adult) (pediatric): Secondary | ICD-10-CM | POA: Diagnosis present

## 2018-06-13 DIAGNOSIS — R0602 Shortness of breath: Secondary | ICD-10-CM | POA: Diagnosis not present

## 2018-06-13 DIAGNOSIS — E860 Dehydration: Secondary | ICD-10-CM | POA: Diagnosis present

## 2018-06-13 DIAGNOSIS — Q211 Atrial septal defect: Secondary | ICD-10-CM

## 2018-06-13 DIAGNOSIS — I5043 Acute on chronic combined systolic (congestive) and diastolic (congestive) heart failure: Secondary | ICD-10-CM | POA: Diagnosis present

## 2018-06-13 DIAGNOSIS — Z79899 Other long term (current) drug therapy: Secondary | ICD-10-CM

## 2018-06-13 DIAGNOSIS — Z8711 Personal history of peptic ulcer disease: Secondary | ICD-10-CM

## 2018-06-13 DIAGNOSIS — Z6835 Body mass index (BMI) 35.0-35.9, adult: Secondary | ICD-10-CM

## 2018-06-13 DIAGNOSIS — E669 Obesity, unspecified: Secondary | ICD-10-CM | POA: Diagnosis present

## 2018-06-13 DIAGNOSIS — I110 Hypertensive heart disease with heart failure: Secondary | ICD-10-CM | POA: Diagnosis present

## 2018-06-13 LAB — COMPREHENSIVE METABOLIC PANEL
ALBUMIN: 3.5 g/dL (ref 3.5–5.0)
ALT: 42 U/L (ref 0–44)
AST: 50 U/L — ABNORMAL HIGH (ref 15–41)
Alkaline Phosphatase: 64 U/L (ref 38–126)
Anion gap: 10 (ref 5–15)
BUN: 16 mg/dL (ref 8–23)
CO2: 23 mmol/L (ref 22–32)
Calcium: 8.5 mg/dL — ABNORMAL LOW (ref 8.9–10.3)
Chloride: 106 mmol/L (ref 98–111)
Creatinine, Ser: 1.01 mg/dL (ref 0.61–1.24)
GFR calc Af Amer: >60 mL/min (ref >60)
GFR calc non Af Amer: >60 mL/min (ref >60)
GLUCOSE: 180 mg/dL — AB (ref 70–99)
Potassium: 4 mmol/L (ref 3.5–5.1)
Sodium: 139 mmol/L (ref 135–145)
Total Bilirubin: 0.8 mg/dL (ref 0.3–1.2)
Total Protein: 6.7 g/dL (ref 6.5–8.1)

## 2018-06-13 LAB — CBC
HCT: 40.4 % (ref 39.0–52.0)
HEMOGLOBIN: 12.7 g/dL — AB (ref 13.0–17.0)
MCH: 28.5 pg (ref 26.0–34.0)
MCHC: 31.4 g/dL (ref 30.0–36.0)
MCV: 90.8 fL (ref 80.0–100.0)
Platelets: 166 10*3/uL (ref 150–400)
RBC: 4.45 MIL/uL (ref 4.22–5.81)
RDW: 14 % (ref 11.5–15.5)
WBC: 6.4 10*3/uL (ref 4.0–10.5)
nRBC: 0 % (ref 0.0–0.2)

## 2018-06-13 LAB — CBG MONITORING, ED: Glucose-Capillary: 175 mg/dL — ABNORMAL HIGH (ref 70–99)

## 2018-06-13 LAB — URINALYSIS, ROUTINE W REFLEX MICROSCOPIC
Bilirubin Urine: NEGATIVE
GLUCOSE, UA: NEGATIVE mg/dL
Hgb urine dipstick: NEGATIVE
Ketones, ur: NEGATIVE mg/dL
Leukocytes, UA: NEGATIVE
Nitrite: NEGATIVE
Protein, ur: NEGATIVE mg/dL
Specific Gravity, Urine: 1.009 (ref 1.005–1.030)
pH: 5 (ref 5.0–8.0)

## 2018-06-13 LAB — I-STAT TROPONIN, ED: Troponin i, poc: 0.05 ng/mL (ref 0.00–0.08)

## 2018-06-13 LAB — BRAIN NATRIURETIC PEPTIDE: B NATRIURETIC PEPTIDE 5: 336.7 pg/mL — AB (ref 0.0–100.0)

## 2018-06-13 LAB — I-STAT CG4 LACTIC ACID, ED: Lactic Acid, Venous: 1.68 mmol/L (ref 0.5–1.9)

## 2018-06-13 MED ORDER — IOPAMIDOL (ISOVUE-370) INJECTION 76%
100.0000 mL | Freq: Once | INTRAVENOUS | Status: AC | PRN
  Administered 2018-06-13: 100 mL via INTRAVENOUS

## 2018-06-13 MED ORDER — ALFUZOSIN HCL ER 10 MG PO TB24
10.0000 mg | ORAL_TABLET | Freq: Every day | ORAL | Status: DC
  Administered 2018-06-14 – 2018-06-22 (×9): 10 mg via ORAL
  Filled 2018-06-13 (×9): qty 1

## 2018-06-13 MED ORDER — LISINOPRIL 20 MG PO TABS
20.0000 mg | ORAL_TABLET | Freq: Every day | ORAL | Status: DC
  Filled 2018-06-13: qty 1

## 2018-06-13 MED ORDER — HEPARIN (PORCINE) 25000 UT/250ML-% IV SOLN
1900.0000 [IU]/h | INTRAVENOUS | Status: DC
  Administered 2018-06-13: 1400 [IU]/h via INTRAVENOUS
  Filled 2018-06-13 (×2): qty 250

## 2018-06-13 MED ORDER — ACETAMINOPHEN 325 MG PO TABS: 650.0000 mg | ORAL_TABLET | ORAL | Status: DC | PRN

## 2018-06-13 MED ORDER — DILTIAZEM HCL 25 MG/5ML IV SOLN
20.0000 mg | Freq: Once | INTRAVENOUS | Status: AC
  Administered 2018-06-13: 20 mg via INTRAVENOUS
  Filled 2018-06-13: qty 5

## 2018-06-13 MED ORDER — POLYVINYL ALCOHOL 1.4 % OP SOLN: 1.0000 [drp] | Freq: Three times a day (TID) | OPHTHALMIC | Status: DC | PRN

## 2018-06-13 MED ORDER — HEPARIN BOLUS VIA INFUSION
4000.0000 [IU] | Freq: Once | INTRAVENOUS | Status: AC
  Administered 2018-06-13: 4000 [IU] via INTRAVENOUS
  Filled 2018-06-13: qty 4000

## 2018-06-13 MED ORDER — DILTIAZEM HCL-DEXTROSE 100-5 MG/100ML-% IV SOLN (PREMIX)
5.0000 mg/h | INTRAVENOUS | Status: DC
  Administered 2018-06-13: 5 mg/h via INTRAVENOUS
  Filled 2018-06-13: qty 100

## 2018-06-13 MED ORDER — INSULIN ASPART 100 UNIT/ML ~~LOC~~ SOLN
0.0000 [IU] | Freq: Three times a day (TID) | SUBCUTANEOUS | Status: DC
  Administered 2018-06-14: 5 [IU] via SUBCUTANEOUS
  Administered 2018-06-14 – 2018-06-15 (×2): 3 [IU] via SUBCUTANEOUS
  Administered 2018-06-15 – 2018-06-16 (×2): 2 [IU] via SUBCUTANEOUS
  Administered 2018-06-16 – 2018-06-17 (×2): 3 [IU] via SUBCUTANEOUS
  Administered 2018-06-17 – 2018-06-18 (×3): 2 [IU] via SUBCUTANEOUS
  Administered 2018-06-18: 3 [IU] via SUBCUTANEOUS
  Administered 2018-06-19: 2 [IU] via SUBCUTANEOUS
  Administered 2018-06-19: 3 [IU] via SUBCUTANEOUS
  Administered 2018-06-19 – 2018-06-21 (×4): 2 [IU] via SUBCUTANEOUS

## 2018-06-13 MED ORDER — ONDANSETRON HCL 4 MG/2ML IJ SOLN: 4.0000 mg | Freq: Four times a day (QID) | INTRAMUSCULAR | Status: DC | PRN

## 2018-06-13 MED ORDER — ATORVASTATIN CALCIUM 10 MG PO TABS
20.0000 mg | ORAL_TABLET | Freq: Every day | ORAL | Status: DC
  Administered 2018-06-13 – 2018-06-22 (×10): 20 mg via ORAL
  Filled 2018-06-13 (×10): qty 2

## 2018-06-13 MED ORDER — GABAPENTIN 100 MG PO CAPS
100.0000 mg | ORAL_CAPSULE | Freq: Three times a day (TID) | ORAL | Status: DC
  Administered 2018-06-13 – 2018-06-22 (×25): 100 mg via ORAL
  Filled 2018-06-13 (×26): qty 1

## 2018-06-13 MED ORDER — ALBUTEROL SULFATE (2.5 MG/3ML) 0.083% IN NEBU
3.0000 mL | INHALATION_SOLUTION | RESPIRATORY_TRACT | Status: DC
  Filled 2018-06-13: qty 3

## 2018-06-13 MED ORDER — IOPAMIDOL (ISOVUE-370) INJECTION 76%
INTRAVENOUS | Status: AC
  Filled 2018-06-13: qty 100

## 2018-06-13 NOTE — ED Triage Notes (Signed)
At dr's for routine check-up following [facility] visit for CHF. Noted to be in new-onset AFIB RVR. Aox54, denies chest pain  92% on RA, SOB x1mo

## 2018-06-13 NOTE — ED Notes (Signed)
Patient transported to CT 

## 2018-06-13 NOTE — H&P (Signed)
History and Physical    [name] KTG:[id number] DOB: Feb 08, 1935 DOA: 06/13/2018  PCP: [name], MD  Patient coming from: Home  I have personally briefly reviewed patient's old medical records in [place]  Chief Complaint: New onset A.Fib RVR  HPI: [name] is a 83 y.o. male with medical history significant of prostate cancer, grade 2 diastolic CHF with PAH found on echo in October, HTN.  Patient has had DOE for past 6 months or so he says.  Symptoms constant, slow progression.  Was admitted in October for CHF admission.  Evaluated today by PCP, found to be in A.Fib RVR, sent to ED for further evaluation.  ED Course: A.Fib RVR to 140, BP 123/89, HR improved and BP dropped to 93/69 after 20mg  IV cardizem, slowly trending back up now 100-110 HR.  I stat trop 0.05, CT neg for PE.   Review of Systems: As per HPI otherwise 10 point review of systems negative.   Past Medical History:  Diagnosis Date  . Abnormal PSA 01/17/2012   10.38/4/5/ PSA:2013=6.96/ PSA 07/08/2011=6.26/ PSA 2006=8.6  . Blood transfusion without reported diagnosis    during vagotomy 1976  . Cancer [facility]) 2006   L-2 solitary metastatic lesion/no rad tx ,asymptomatic  . Chronic kidney disease    nephrolithiasis right kidney  . Gynecomastia 04/21/11   seen by Dr.Murray, no rad tx  . History of echocardiogram    echo 4/16:  mild LVH, EF 55-60%, Gr 1 DD, no RWMA, mild AI, mild MR, mod LAE, mild TR  . Hx of cardiovascular stress test    Myoview 5/16:  EF 50%, no scar or ischemia, low risk  . Hydroureteronephrosis    right   . Hypertension   . Osteopenia   . Overweight   . Prostate cancer ([place]) 12/09/03 dx   Prostate ca/adenocarcinoma,gleason=3+3=6    pSA 4.6  . Sleep apnea    pt unaware  . Stroke [facility])    asymptomatic, found on head imaging  . Ulcer    peptic  . Use of leuprolide acetate (Lupron) 06/2005-11/2006   02/2008 degarelix 02/2008 x 3 doses    Past Surgical History:  Procedure  Laterality Date  . PROSTATE BIOPSY  12/09/03   adenocarcinoma,glerason: 3+3=6  . VAGOTOMY  1978   partial     reports that he quit smoking about 33 years ago. His smoking use included pipe. He has never used smokeless tobacco. He reports that he does not drink alcohol or use drugs.  Allergies  Allergen Reactions  . Aspirin Other (See Comments)    History of "stomach ulcers," was told to not take this     Family History  Problem Relation Age of Onset  . Colon cancer Neg Hx   . Stroke Neg Hx      Prior to Admission medications   Medication Sig Start Date End Date Taking? Authorizing Provider  alfuzosin (UROXATRAL) 10 MG 24 hr tablet Take 10 mg by mouth daily. 03/08/18  Yes [provider]  amLODipine (NORVASC) 10 MG tablet Take 1 tablet (10 mg total) by mouth daily. 04/29/18 06/13/18 Yes Arrien, Jimmy Picket, MD  atorvastatin (LIPITOR) 20 MG tablet Take 1 tablet (20 mg total) by mouth daily. 04/29/18 06/13/18 Yes Arrien, Jimmy Picket, MD  Calcium-Vitamin D (CALTRATE 600 PLUS-VIT D PO) Take 1 tablet by mouth daily.    Yes [provider]  furosemide (LASIX) 20 MG tablet Take 1 tablet (20 mg total) by mouth daily as needed  for up to 20 days for fluid or edema (as needed in case of swelling, or weight gain 3 lbs in 24 hours or 5lbs in seven days.). Patient taking differently: Take 20 mg by mouth daily as needed for fluid or edema (as needed in case of swelling, or weight gain 3 lbs in 24 hours or 5lbs in seven days).  04/29/18 06/13/18 Yes Arrien, Jimmy Picket, MD  gabapentin (NEURONTIN) 100 MG capsule Take 1 capsule (100 mg total) by mouth 3 (three) times daily. 04/29/18 06/13/18 Yes Arrien, Jimmy Picket, MD  lisinopril (PRINIVIL,ZESTRIL) 20 MG tablet Take 20 mg by mouth daily. 02/06/18  Yes [provider]  metFORMIN (GLUCOPHAGE) 500 MG tablet Take 1 tablet (500 mg total) by mouth 2 (two) times daily with a meal. 04/29/18 06/13/18 Yes Arrien, Jimmy Picket, MD  potassium chloride SA (K-DUR,KLOR-CON) 20 MEQ tablet Take 1 tablet (20 mEq total) by mouth daily as needed for up to 20 days (Take only if taking furosemide (Lasix)). Patient taking differently: Take 20 mEq by mouth daily as needed (only if taking Furosemide).  04/29/18 06/13/18 Yes Arrien, Jimmy Picket, MD  PROAIR HFA 108 [phone] Base) MCG/ACT inhaler Inhale 2 puffs into the lungs See admin instructions. Inhale 2 puffs into lungs every 4-6 hours as needed for shortness of breath or wheezing 05/26/18  Yes [provider]  Propylene Glycol (SYSTANE COMPLETE) 0.6 % SOLN Place 1-2 drops into both eyes 3 (three) times daily as needed (for dryness).   Yes [provider]    Physical Exam: Vitals:   06/13/18 1828 06/13/18 1830 06/13/18 1900 06/13/18 1930  BP:  109/81 117/80 96/68  Pulse: (!) 113 (!) 114 (!) 118 (!) 119  Resp: 19 (!) 21 (!) 23 18  Temp:      TempSrc:      SpO2: 93% (!) 88% 94% 94%  Weight:      Height:        Constitutional: NAD, calm, comfortable Eyes: PERRL, lids and conjunctivae normal ENMT: Mucous membranes are moist. Posterior pharynx clear of any exudate or lesions.Normal dentition.  Neck: normal, supple, no masses, no thyromegaly Respiratory: clear to auscultation bilaterally, no wheezing, no crackles. Normal respiratory effort. No accessory muscle use.  Cardiovascular: irr, irr, tachycardic Abdomen: no tenderness, no masses palpated. No hepatosplenomegaly. Bowel sounds positive.  Musculoskeletal: no clubbing / cyanosis. No joint deformity upper and lower extremities. Good ROM, no contractures. Normal muscle tone.  Skin: no rashes, lesions, ulcers. No induration Neurologic: CN 2-12 grossly intact. Sensation intact, DTR normal. Strength 5/5 in all 4.  Psychiatric: Normal judgment and insight. Alert and oriented x 3. Normal mood.    Labs on Admission: I have personally reviewed following labs and imaging studies  CBC: Recent Labs  Lab  06/13/18 1515  WBC 6.4  HGB 12.7*  HCT 40.4  MCV 90.8  PLT 332   Basic Metabolic Panel: Recent Labs  Lab 06/13/18 1515  NA 139  K 4.0  CL 106  CO2 23  GLUCOSE 180*  BUN 16  CREATININE 1.01  CALCIUM 8.5*   GFR: Estimated Creatinine Clearance: 74.9 mL/min (by C-G formula based on SCr of 1.01 mg/dL). Liver Function Tests: Recent Labs  Lab 06/13/18 1515  AST 50*  ALT 42  ALKPHOS 64  BILITOT 0.8  PROT 6.7  ALBUMIN 3.5   No results for input(s): LIPASE, AMYLASE in the last 168 hours. No results for input(s): AMMONIA in the last 168 hours. Coagulation Profile: No results  for input(s): INR, PROTIME in the last 168 hours. Cardiac Enzymes: No results for input(s): CKTOTAL, CKMB, CKMBINDEX, TROPONINI in the last 168 hours. BNP (last 3 results) No results for input(s): PROBNP in the last 8760 hours. HbA1C: No results for input(s): HGBA1C in the last 72 hours. CBG: No results for input(s): GLUCAP in the last 168 hours. Lipid Profile: No results for input(s): CHOL, HDL, LDLCALC, TRIG, CHOLHDL, LDLDIRECT in the last 72 hours. Thyroid Function Tests: No results for input(s): TSH, T4TOTAL, FREET4, T3FREE, THYROIDAB in the last 72 hours. Anemia Panel: No results for input(s): VITAMINB12, FOLATE, FERRITIN, TIBC, IRON, RETICCTPCT in the last 72 hours. Urine analysis:    Component Value Date/Time   COLORURINE YELLOW 06/13/2018 [place] 06/13/2018 1515   LABSPEC 1.009 06/13/2018 1515   PHURINE 5.0 06/13/2018 1515   GLUCOSEU NEGATIVE 06/13/2018 1515   HGBUR NEGATIVE 06/13/2018 1515   [place] 06/13/2018 1515   KETONESUR NEGATIVE 06/13/2018 1515   PROTEINUR NEGATIVE 06/13/2018 1515   UROBILINOGEN 0.2 11/25/2008 0201   NITRITE NEGATIVE 06/13/2018 1515   LEUKOCYTESUR NEGATIVE 06/13/2018 1515    Radiological Exams on Admission: Ct Angio Chest Pe W And/or Wo Contrast  Result Date: 06/13/2018 CLINICAL DATA:  New onset AFib EXAM: CT ANGIOGRAPHY  CHEST WITH CONTRAST TECHNIQUE: Multidetector CT imaging of the chest was performed using the standard protocol during bolus administration of intravenous contrast. Multiplanar CT image reconstructions and MIPs were obtained to evaluate the vascular anatomy. CONTRAST:  167mL ISOVUE-370 IOPAMIDOL (ISOVUE-370) INJECTION 76% COMPARISON:  Chest x-ray 06/13/2018, CT chest 04/28/2018 FINDINGS: Cardiovascular: Satisfactory opacification of the pulmonary arteries to the segmental level. Poor opacification of lower order branch vessels, most notable in the right greater than left upper lobes which limits evaluation for distal emboli. No acute filling defects are seen within the central vessels. Mild aortic atherosclerosis. Aneurysmal dilatation of the ascending aorta up to 4.1 cm. Cardiomegaly. Coronary vascular calcification. No large pericardial effusion Mediastinum/Nodes: Midline trachea. No thyroid mass. No change in 15 mm low right paratracheal lymph node. Esophagus within normal limits. Lungs/Pleura: Small bilateral pleural effusions are increased compared to prior. There is no acute consolidation or pneumothorax. Bleb in the right upper lobe. Upper Abdomen: No acute abnormality. Musculoskeletal: No chest wall abnormality. No acute or significant osseous findings. Review of the MIP images confirms the above findings. IMPRESSION: 1. Limited evaluation of distal pulmonary arterial vessels due to poor opacification peripherally. No definite acute central embolus is seen. 2. Cardiomegaly.  Small bilateral pleural effusions Aortic Atherosclerosis (ICD10-I70.0). Electronically Signed   By: Donavan Foil M.D.   On: 06/13/2018 19:15   Dg Chest Port 1 View  Result Date: 06/13/2018 CLINICAL DATA:  Chronic six-month history of shortness of breath. Personal history of prostate cancer diagnosed in 2005. Current history of hypertension and prediabetes. EXAM: PORTABLE CHEST 1 VIEW COMPARISON:  CTA chest 04/28/2018. Chest x-rays  04/27/2018 and earlier. FINDINGS: Cardiac silhouette moderately enlarged, unchanged. Thoracic aorta tortuous and mildly atherosclerotic, unchanged. Hilar and mediastinal contours otherwise unremarkable. Scarring at the LEFT lung base, unchanged. Lungs otherwise clear. Pulmonary vascularity normal. No visible pleural effusions. IMPRESSION: Stable cardiomegaly without pulmonary edema. Stable scarring at the LEFT lung base. No acute cardiopulmonary disease. Electronically Signed   By: Evangeline Dakin M.D.   On: 06/13/2018 15:36    EKG: Independently reviewed.  Assessment/Plan Principal Problem:   Atrial fibrillation with RVR (HCC) Active Problems:   Diabetes mellitus without complication (HCC)   PAH (pulmonary artery hypertension) (  [place])   HTN (hypertension)    1. A.Fib RVR - 1. A.Fib pathway 2. cardizem gtt 3. Heparin gtt 4. CHADS-VASc = 5 5. Tele monitor 6. Will hold off on repeat echo since he just had one in Oct. 2. PAH - 1. PAP 75 in Oct 2. Call pulm in AM, probably needs follow up 3. Suspect he may have ambulatory O2 requirement that's not yet diagnosed. 3. DM2 - 1. Hold metformin 2. Mod scale SSI AC 4. HTN - 1. Holding Amlodipine (starting on cardizem).  DVT prophylaxis: Heparin gtt Code Status: Full Family Communication: No family in room Disposition Plan: Home after admit Consults called: None Admission status: Place in obs   Doloras [place], [place] [facility] Pager [phone] Only works nights!  If 7AM-7PM, please contact the primary day team physician taking care of patient  [email or web address] Password [email or web address]  06/13/2018, 8:01 PM

## 2018-06-13 NOTE — ED Provider Notes (Signed)
[facility] Emergency Department Provider Note MRN:  [id number]  Arrival date & time: 06/13/18     Chief Complaint   Atrial Fibrillation   History of Present Illness   [name] is a 83 y.o. year-old male with a history of heart failure, stroke presenting to the ED with chief complaint of atrial fibrillation.  Patient explains that he has been feeling general malaise, fatigue, dyspnea on exertion for several months.  Was admitted back in October for a heart failure exacerbation.  Symptoms have not changed recently, slow progression, explains that he just feels terrible, feels ill.  Evaluated by PCP today, found to be in A. fib with RVR.  Sent here for further evaluation.  Patient denies any palpitations, no chest pain, no dizziness, no abdominal pain, no fever, no cough.  Notes that he has been getting up very frequently at night to urinate recently.  Review of Systems  A complete 10 system review of systems was obtained and all systems are negative except as noted in the HPI and PMH.   Patient's Health History    Past Medical History:  Diagnosis Date  . Abnormal PSA 01/17/2012   10.38/4/5/ PSA:2013=6.96/ PSA 07/08/2011=6.26/ PSA 2006=8.6  . Blood transfusion without reported diagnosis    during vagotomy 1976  . Cancer [facility]) 2006   L-2 solitary metastatic lesion/no rad tx ,asymptomatic  . Chronic kidney disease    nephrolithiasis right kidney  . Gynecomastia 04/21/11   seen by Dr.Murray, no rad tx  . History of echocardiogram    echo 4/16:  mild LVH, EF 55-60%, Gr 1 DD, no RWMA, mild AI, mild MR, mod LAE, mild TR  . Hx of cardiovascular stress test    Myoview 5/16:  EF 50%, no scar or ischemia, low risk  . Hydroureteronephrosis    right   . Hypertension   . Osteopenia   . Overweight   . Prostate cancer ([place]) 12/09/03 dx   Prostate ca/adenocarcinoma,gleason=3+3=6    pSA 4.6  . Sleep apnea    pt unaware  . Stroke [facility])    asymptomatic, found on head imaging   . Ulcer    peptic  . Use of leuprolide acetate (Lupron) 06/2005-11/2006   02/2008 degarelix 02/2008 x 3 doses    Past Surgical History:  Procedure Laterality Date  . PROSTATE BIOPSY  12/09/03   adenocarcinoma,glerason: 3+3=6  . VAGOTOMY  1978   partial    Family History  Problem Relation Age of Onset  . Colon cancer Neg Hx   . Stroke Neg Hx     Social History   Socioeconomic History  . Marital status: Married    Spouse name: Not on file  . Number of children: 2  . Years of education: Not on file  . 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 Financial resource strain: Not on file  . Food insecurity:    Worry: Not on file    Inability: Not on file  . Transportation needs:    Medical: Not on file    Non-medical: Not on file  Tobacco Use  . Smoking status: Former Smoker    Types: Pipe    Last attempt to quit: 07/05/1984    Years since quitting: 33.9  . Smokeless tobacco: Never Used  Substance and Sexual Activity  . Alcohol use: No  . Drug use: No  . Sexual activity: Never  Lifestyle  .  Physical activity:    Days per week: Not on file    Minutes per session: Not on file  . Stress: Not on file  Relationships  . Social connections:    Talks on phone: Not on file    Gets together: Not on file    Attends religious service: Not on file    Active member of club or organization: Not on file    Attends meetings of clubs or organizations: Not on file    Relationship status: Not on file  . Intimate partner violence:    Fear of current or ex partner: Not on file    Emotionally abused: Not on file    Physically abused: Not on file    Forced sexual activity: Not on file  Other Topics Concern  . Not on file  Social History Narrative   Pt lives in [place], alone.  Widower.   Retired Animator.     Physical Exam  Vital Signs and Nursing Notes reviewed Vitals:   06/13/18 2300 06/13/18 2330  BP:  115/80 107/85  Pulse: (!) 58 63  Resp: (!) 22 (!) 29  Temp:    SpO2: 94% 93%    CONSTITUTIONAL: Well-appearing, NAD NEURO:  Alert and oriented x 3, no focal deficits EYES:  eyes equal and reactive ENT/NECK:  no LAD, no JVD CARDIO: Tachycardic and irregular rate, well-perfused, normal S1 and S2 PULM:  CTAB no wheezing or rhonchi GI/GU:  normal bowel sounds, non-distended, non-tender MSK/SPINE:  No gross deformities, no edema SKIN:  no rash, atraumatic PSYCH:  Appropriate speech and behavior  Diagnostic and Interventional Summary    EKG Interpretation  Date/Time:  Tuesday June 13 2018 15:29:17 EST Ventricular Rate:  106 PR Interval:    QRS Duration: 152 QT Interval:  375 QTC Calculation: 498 R Axis:   -75 Text Interpretation:  Atrial fibrillation Ventricular premature complex Right bundle branch block Inferior infarct, old Confirmed by Gerlene Fee [phone]) on 06/13/2018 4:14:41 PM      Labs Reviewed  CBC - Abnormal; Notable for the following components:      Result Value   Hemoglobin 12.7 (*)    All other components within normal limits  COMPREHENSIVE METABOLIC PANEL - Abnormal; Notable for the following components:   Glucose, Bld 180 (*)    Calcium 8.5 (*)    AST 50 (*)    All other components within normal limits  BRAIN NATRIURETIC PEPTIDE - Abnormal; Notable for the following components:   B Natriuretic Peptide 336.7 (*)    All other components within normal limits  CBG MONITORING, ED - Abnormal; Notable for the following components:   Glucose-Capillary 175 (*)    All other components within normal limits  URINALYSIS, ROUTINE W REFLEX MICROSCOPIC  HEPARIN LEVEL (UNFRACTIONATED)  CBC  I-STAT TROPONIN, ED  I-STAT CG4 LACTIC ACID, ED    CT Angio Chest PE W and/or Wo Contrast  Final Result    DG Chest Port 1 View  Final Result      Medications  acetaminophen (TYLENOL) tablet 650 mg (has no administration in time range)  ondansetron (ZOFRAN) injection 4  mg (has no administration in time range)  diltiazem (CARDIZEM) 100 mg in dextrose 5% 137mL (1 mg/mL) infusion (7.5 mg/hr Intravenous Rate/Dose Change 06/13/18 2322)  lisinopril (PRINIVIL,ZESTRIL) tablet 20 mg (has no administration in time range)  gabapentin (NEURONTIN) capsule 100 mg (100 mg Oral Given 06/13/18 0900)  insulin aspart (novoLOG) injection 0-15 Units (has no administration in  time range)  atorvastatin (LIPITOR) tablet 20 mg (20 mg Oral Given 06/13/18 2220)  alfuzosin (UROXATRAL) 24 hr tablet 10 mg (has no administration in time range)  albuterol (PROVENTIL) (2.5 MG/3ML) 0.083% nebulizer solution 3 mL (has no administration in time range)  polyvinyl alcohol (LIQUIFILM TEARS) 1.4 % ophthalmic solution 1-2 drop (has no administration in time range)  heparin ADULT infusion 100 units/mL (25000 units/272mL sodium chloride 0.45%) (1,400 Units/hr Intravenous New Bag/Given 06/13/18 2218)  diltiazem (CARDIZEM) injection 20 mg (20 mg Intravenous Given 06/13/18 1516)  iopamidol (ISOVUE-370) 76 % injection 100 mL (100 mLs Intravenous Contrast Given 06/13/18 1824)  heparin bolus via infusion 4,000 Units (4,000 Units Intravenous Bolus from Bag 06/13/18 2218)     Procedures Critical Care Critical Care Documentation Critical care time provided by me (excluding procedures): 38 minutes  Condition necessitating critical care: Atrial fibrillation with rapid ventricular response, hypoxic respiratory failure  Components of critical care management: reviewing of prior records, laboratory and imaging interpretation, frequent re-examination and reassessment of vital signs, administration of IV diltiazem, supplemental oxygen, discussion with consulting services.    ED Course and Medical Decision Making  I have reviewed the triage vital signs and the nursing notes.  Pertinent labs & imaging results that were available during my care of the patient were reviewed by me and considered in my medical  decision making (see below for details).  A. fib with RVR, time of onset unknown in this 83 year old male.  Will evaluate for possible secondary causes of A. fib, no lower extremity edema or tenderness, no sudden onset shortness of breath, low concern for PE.  Given single dose 20 mg diltiazem, will reassess.  Decent rate control with the diltiazem IV, heart rate down to 70s and 80s for 1 to 2 hours.  Heart rate creeping back up to the 110s, patient continues to have new oxygen requirement, CTPA unremarkable.  Admitted to hospitalist service for further care and evaluation.  Barth Kirks. Sedonia Small, MD [place] [email or web address]  Final Clinical Impressions(s) / ED Diagnoses     ICD-10-CM   1. Atrial fibrillation with RVR (HCC) I48.91   2. SOB (shortness of breath) R06.02 DG Chest [facility] 1 View    DG Chest [place] 1 View  3. Acute respiratory failure with hypoxia [facility]) J96.01     ED Discharge Orders    None         Maudie Flakes, MD 06/13/18 2354

## 2018-06-13 NOTE — Progress Notes (Signed)
Oval for heparin Indication: atrial fibrillation  Patient Measurements: Height: 6' (182.9 cm) Weight: 252 lb (114.3 kg) IBW/kg (Calculated) : 77.6 Heparin Dosing Weight: 102 kg  Vital Signs: Temp: 97.5 F (36.4 C) (12/10 1458) Temp Source: Oral (12/10 1458) BP: 96/68 (12/10 1930) Pulse Rate: 119 (12/10 1930)  Labs: Recent Labs    06/13/18 1515  HGB 12.7*  HCT 40.4  PLT 166  CREATININE 1.01    Assessment: Sent from outpatient MD with new onset AFib. He is not on any anticoagulation prior to admission.  H/h 12.7/40, plts wnl. SCr 1. Will start heparin infusion.   Goal of Therapy:  Heparin level 0.3-0.7 units/ml Monitor platelets by anticoagulation protocol: Yes    Plan:  -Heparin bolus 4000 units x1 then 1400 units/hr -Daily HL, CBC -Level with AM labs    [name] 06/13/2018,8:20 PM

## 2018-06-13 NOTE — ED Notes (Signed)
Pt given turkey sandwich and water

## 2018-06-13 NOTE — ED Notes (Signed)
ED Provider at bedside. 

## 2018-06-14 DIAGNOSIS — I4891 Unspecified atrial fibrillation: Secondary | ICD-10-CM | POA: Diagnosis present

## 2018-06-14 DIAGNOSIS — I480 Paroxysmal atrial fibrillation: Secondary | ICD-10-CM | POA: Diagnosis not present

## 2018-06-14 DIAGNOSIS — Z7984 Long term (current) use of oral hypoglycemic drugs: Secondary | ICD-10-CM | POA: Diagnosis not present

## 2018-06-14 DIAGNOSIS — Z6835 Body mass index (BMI) 35.0-35.9, adult: Secondary | ICD-10-CM | POA: Diagnosis not present

## 2018-06-14 DIAGNOSIS — I083 Combined rheumatic disorders of mitral, aortic and tricuspid valves: Secondary | ICD-10-CM | POA: Diagnosis present

## 2018-06-14 DIAGNOSIS — E860 Dehydration: Secondary | ICD-10-CM | POA: Diagnosis present

## 2018-06-14 DIAGNOSIS — I351 Nonrheumatic aortic (valve) insufficiency: Secondary | ICD-10-CM | POA: Diagnosis not present

## 2018-06-14 DIAGNOSIS — Z79899 Other long term (current) drug therapy: Secondary | ICD-10-CM | POA: Diagnosis not present

## 2018-06-14 DIAGNOSIS — I4510 Unspecified right bundle-branch block: Secondary | ICD-10-CM | POA: Diagnosis present

## 2018-06-14 DIAGNOSIS — M8580 Other specified disorders of bone density and structure, unspecified site: Secondary | ICD-10-CM | POA: Diagnosis present

## 2018-06-14 DIAGNOSIS — J9601 Acute respiratory failure with hypoxia: Secondary | ICD-10-CM | POA: Diagnosis present

## 2018-06-14 DIAGNOSIS — I10 Essential (primary) hypertension: Secondary | ICD-10-CM | POA: Diagnosis not present

## 2018-06-14 DIAGNOSIS — Z87442 Personal history of urinary calculi: Secondary | ICD-10-CM | POA: Diagnosis not present

## 2018-06-14 DIAGNOSIS — Z87891 Personal history of nicotine dependence: Secondary | ICD-10-CM | POA: Diagnosis not present

## 2018-06-14 DIAGNOSIS — I5043 Acute on chronic combined systolic (congestive) and diastolic (congestive) heart failure: Secondary | ICD-10-CM | POA: Diagnosis present

## 2018-06-14 DIAGNOSIS — Q211 Atrial septal defect: Secondary | ICD-10-CM | POA: Diagnosis not present

## 2018-06-14 DIAGNOSIS — Z8711 Personal history of peptic ulcer disease: Secondary | ICD-10-CM | POA: Diagnosis not present

## 2018-06-14 DIAGNOSIS — Z8546 Personal history of malignant neoplasm of prostate: Secondary | ICD-10-CM | POA: Diagnosis not present

## 2018-06-14 DIAGNOSIS — Z886 Allergy status to analgesic agent status: Secondary | ICD-10-CM | POA: Diagnosis not present

## 2018-06-14 DIAGNOSIS — E119 Type 2 diabetes mellitus without complications: Secondary | ICD-10-CM | POA: Diagnosis present

## 2018-06-14 DIAGNOSIS — I5033 Acute on chronic diastolic (congestive) heart failure: Secondary | ICD-10-CM | POA: Diagnosis not present

## 2018-06-14 DIAGNOSIS — R0602 Shortness of breath: Secondary | ICD-10-CM | POA: Diagnosis present

## 2018-06-14 DIAGNOSIS — I2721 Secondary pulmonary arterial hypertension: Secondary | ICD-10-CM | POA: Diagnosis present

## 2018-06-14 DIAGNOSIS — E669 Obesity, unspecified: Secondary | ICD-10-CM | POA: Diagnosis present

## 2018-06-14 DIAGNOSIS — I361 Nonrheumatic tricuspid (valve) insufficiency: Secondary | ICD-10-CM | POA: Diagnosis not present

## 2018-06-14 DIAGNOSIS — Z8673 Personal history of transient ischemic attack (TIA), and cerebral infarction without residual deficits: Secondary | ICD-10-CM | POA: Diagnosis not present

## 2018-06-14 DIAGNOSIS — I340 Nonrheumatic mitral (valve) insufficiency: Secondary | ICD-10-CM | POA: Diagnosis not present

## 2018-06-14 DIAGNOSIS — E785 Hyperlipidemia, unspecified: Secondary | ICD-10-CM | POA: Diagnosis present

## 2018-06-14 DIAGNOSIS — I110 Hypertensive heart disease with heart failure: Secondary | ICD-10-CM | POA: Diagnosis present

## 2018-06-14 DIAGNOSIS — I5021 Acute systolic (congestive) heart failure: Secondary | ICD-10-CM | POA: Diagnosis not present

## 2018-06-14 DIAGNOSIS — N179 Acute kidney failure, unspecified: Secondary | ICD-10-CM | POA: Diagnosis not present

## 2018-06-14 DIAGNOSIS — G4733 Obstructive sleep apnea (adult) (pediatric): Secondary | ICD-10-CM | POA: Diagnosis present

## 2018-06-14 LAB — CBC
HCT: 38.7 % — ABNORMAL LOW (ref 39.0–52.0)
Hemoglobin: 12.2 g/dL — ABNORMAL LOW (ref 13.0–17.0)
MCH: 28.6 pg (ref 26.0–34.0)
MCHC: 31.5 g/dL (ref 30.0–36.0)
MCV: 90.8 fL (ref 80.0–100.0)
Platelets: 150 10*3/uL (ref 150–400)
RBC: 4.26 MIL/uL (ref 4.22–5.81)
RDW: 14.2 % (ref 11.5–15.5)
WBC: 6.1 10*3/uL (ref 4.0–10.5)
nRBC: 0 % (ref 0.0–0.2)

## 2018-06-14 LAB — GLUCOSE, CAPILLARY
Glucose-Capillary: 220 mg/dL — ABNORMAL HIGH (ref 70–99)
Glucose-Capillary: 120 mg/dL — ABNORMAL HIGH (ref 70–99)

## 2018-06-14 LAB — HEPARIN LEVEL (UNFRACTIONATED)
Heparin Unfractionated: 0.2 IU/mL — ABNORMAL LOW (ref 0.30–0.70)
Heparin Unfractionated: 0.26 IU/mL — ABNORMAL LOW (ref 0.30–0.70)

## 2018-06-14 LAB — CBG MONITORING, ED: GLUCOSE-CAPILLARY: 159 mg/dL — AB (ref 70–99)

## 2018-06-14 MED ORDER — METOPROLOL TARTRATE 25 MG PO TABS
25.0000 mg | ORAL_TABLET | Freq: Two times a day (BID) | ORAL | Status: DC
  Administered 2018-06-14 – 2018-06-19 (×11): 25 mg via ORAL
  Filled 2018-06-14 (×11): qty 1

## 2018-06-14 MED ORDER — HEPARIN BOLUS VIA INFUSION
2000.0000 [IU] | Freq: Once | INTRAVENOUS | Status: AC
  Administered 2018-06-14: 2000 [IU] via INTRAVENOUS
  Filled 2018-06-14: qty 2000

## 2018-06-14 MED ORDER — APIXABAN 5 MG PO TABS
5.0000 mg | ORAL_TABLET | Freq: Two times a day (BID) | ORAL | Status: DC
Start: 2018-06-14 — End: 2018-06-21
  Administered 2018-06-14 – 2018-06-20 (×13): 5 mg via ORAL
  Filled 2018-06-14 (×14): qty 1

## 2018-06-14 MED ORDER — APIXABAN 5 MG PO TABS: 5.0000 mg | ORAL_TABLET | Freq: Two times a day (BID) | ORAL | Status: DC

## 2018-06-14 MED ORDER — SODIUM CHLORIDE 0.9 % IV BOLUS: 250.0000 mL | Freq: Once | INTRAVENOUS | Status: DC

## 2018-06-14 NOTE — ED Notes (Signed)
Admitting MD - Dr. Cathlean Sauer, paged to [facility] RN to [phone] at 1209.

## 2018-06-14 NOTE — ED Notes (Signed)
Got patient up to the bedside toilet patient has call bell in reach 

## 2018-06-14 NOTE — Progress Notes (Signed)
[place] for heparin > Eliquis Indication: atrial fibrillation  Patient Measurements: Height: 5\' 11"  (180.3 cm) Weight: 251 lb 15.8 oz (114.3 kg) IBW/kg (Calculated) : 75.3 Heparin Dosing Weight: 102 kg  Vital Signs: Temp: 97.5 F (36.4 C) (12/11 1416) Temp Source: Oral (12/11 1416) BP: 99/83 (12/11 1545) Pulse Rate: 115 (12/11 1545)  Labs: Recent Labs    06/13/18 1515 06/14/18 0227 06/14/18 0958  HGB 12.7* 12.2*  --   HCT 40.4 38.7*  --   PLT 166 150  --   HEPARINUNFRC  --  0.20* 0.26*  CREATININE 1.01  --   --     Assessment: Sent from outpatient MD with new onset AFib. He is not on any anticoagulation prior to admission.    Heparin level came back slightly subtherapeutic at 0.26, on 1700 units/hr. Hgb 12.2, plt 150. No s/sx of bleeding.   PM f/u > pharmacy asked to switch patient to Eliquis.  He is > 83 yo, but weight > 60 kg and Scr only 1.01.  Appropriate for full dose.  Goal of Therapy:  Heparin level 0.3-0.7 units/ml Monitor platelets by anticoagulation protocol: Yes    Plan:  Eliquis 5 mg BID. Will complete Eliquis education prior to discharge.  Marguerite Olea, [facility] Clinical Pharmacist Phone [phone]  06/14/2018 5:07 PM

## 2018-06-14 NOTE — ED Notes (Signed)
Paged Admitting for RN Roselyn Reef

## 2018-06-14 NOTE — Progress Notes (Signed)
[name] for heparin Indication: atrial fibrillation  Patient Measurements: Height: 6' (182.9 cm) Weight: 252 lb (114.3 kg) IBW/kg (Calculated) : 77.6 Heparin Dosing Weight: 102 kg  Vital Signs: Temp: 97.5 F (36.4 C) (12/10 1458) Temp Source: Oral (12/10 1458) BP: 109/67 (12/11 0116) Pulse Rate: 57 (12/11 0116)  Labs: Recent Labs    06/13/18 1515 06/14/18 0227  HGB 12.7* 12.2*  HCT 40.4 38.7*  PLT 166 150  HEPARINUNFRC  --  0.20*  CREATININE 1.01  --     Assessment: Sent from outpatient MD with new onset AFib. He is not on any anticoagulation prior to admission.  H/h 12.7/40, plts wnl. SCr 1. Will start heparin infusion. Initial heparin level 0.20 units/ml. Drawn ~ 4 hours after start of drip.   Goal of Therapy:  Heparin level 0.3-0.7 units/ml Monitor platelets by anticoagulation protocol: Yes    Plan:  -Heparin bolus 2000 units x1 and increase heparin drip to 1700 units/hr -Heparin level in 6-8 hours -Daily HL, CBC  Exodus Kutzer [place] 06/14/2018,2:57 AM

## 2018-06-14 NOTE — Progress Notes (Signed)
PROGRESS NOTE    [name]  [id number]:[id number] DOB: [birth year] DOA: 06/13/2018 PCP: [name], MD    Brief Narrative:  83 year old male who presented with new onset atrial fibrillation.  He does have significant past medical history for prostate cancer, diastolic heart failure, hypertension and pulmonary artery hypertension.  Patient reported progressive dyspnea exertion for the last 6 months, he was seen by his primary care physician who found him in atrial fibrillation with rapid ventricular response, was referred for admission for evaluation.  On his initial physical examination blood pressure 109/81, heart rate 113 219, respirate 23, oxygen saturation 88%.  Moist mucous membranes, lungs clear to auscultation bilaterally, heart S1-S2 present tachycardic, irregularly irregular, abdomen soft nontender nondistended, no lower extremity edema.  Patient was admitted to the hospital with working diagnosis of atrial fibrillation with rapid ventricular response.  Assessment & Plan:   Principal Problem:   Atrial fibrillation with RVR (HCC) Active Problems:   Diabetes mellitus without complication (HCC)   PAH (pulmonary artery hypertension) (HCC)   HTN (hypertension)  1. Atrial fibrillation with rapid ventricular response, complicated by acute on chronic diastolic heart failure. Patient is now off diltiazem infusion, will continue rate control with metoprolol 25 mg bid, will continue telemetry monitoring and diuresis with IV furosemide (if blood pressure allows) to target negative fluid balance. Will start patient on anticoagulation for high Chads-Vasc score 5. Echocardiogram from 04/2018 had preserved LV systolic function.   2. HTN. Episodic hypotension in the Ed, will continue to hold on amlodipine, continue blood pressure monitoring, continue metoprolol for heart rate control.   3. T2DM. Will continue glucose cover and monitoring with insulin sliding scale. Patient tolerating po well.   4. Hx  of CVA. Will continue asa and statin, physical therapy evaluation, when more stable.    DVT prophylaxis: apixaban   Code Status: full Family Communication: no family at the bedside  Disposition Plan/ discharge barriers: pending clinical improvement   Body mass index is 34.18 kg/m. Malnutrition Type:      Malnutrition Characteristics:      Nutrition Interventions:     RN Pressure Injury Documentation:     Consultants:     Procedures:     Antimicrobials:       Subjective: Patient continue to have dyspnea and lower extremity edema, positive palpitations on admission, no nausea or vomiting.   Objective: Vitals:   06/14/18 0735 06/14/18 0803 06/14/18 0816 06/14/18 0924  BP: 99/70 105/76 91/61 107/87  Pulse: 71 70 (!) 54 88  Resp:   (!) 27   Temp:      TempSrc:      SpO2: 92% 93% 92%   Weight:      Height:       No intake or output data in the 24 hours ending 06/14/18 1042 Filed Weights   06/13/18 1454  Weight: 114.3 kg    Examination:   General: deconditioned and ill looking appearing  Neurology: Awake and alert, non focal  E ENT: mild pallor, no icterus, oral mucosa moist Cardiovascular: No JVD. S1-S2 present, irregularly irregular rhythm, no gallops, rubs, or murmurs. +++ pitting bilateral lower extremity edema. Pulmonary: decreased breath sounds bilaterally, adequate air movement, no wheezing, rhonchi or rales. Gastrointestinal. Abdomen distended with no organomegaly, non tender, no rebound or guarding Skin. No rashes Musculoskeletal: no joint deformities     Data Reviewed: I have personally reviewed following labs and imaging studies  CBC: Recent Labs  Lab 06/13/18 1515 06/14/18 0227  WBC 6.4  6.1  HGB 12.7* 12.2*  HCT 40.4 38.7*  MCV 90.8 90.8  PLT 166 144   Basic Metabolic Panel: Recent Labs  Lab 06/13/18 1515  NA 139  K 4.0  CL 106  CO2 23  GLUCOSE 180*  BUN 16  CREATININE 1.01  CALCIUM 8.5*   GFR: Estimated  Creatinine Clearance: 74.9 mL/min (by C-G formula based on SCr of 1.01 mg/dL). Liver Function Tests: Recent Labs  Lab 06/13/18 1515  AST 50*  ALT 42  ALKPHOS 64  BILITOT 0.8  PROT 6.7  ALBUMIN 3.5   No results for input(s): LIPASE, AMYLASE in the last 168 hours. No results for input(s): AMMONIA in the last 168 hours. Coagulation Profile: No results for input(s): INR, PROTIME in the last 168 hours. Cardiac Enzymes: No results for input(s): CKTOTAL, CKMB, CKMBINDEX, TROPONINI in the last 168 hours. BNP (last 3 results) No results for input(s): PROBNP in the last 8760 hours. HbA1C: No results for input(s): HGBA1C in the last 72 hours. CBG: Recent Labs  Lab 06/13/18 2208 06/14/18 0757  GLUCAP 175* 159*   Lipid Profile: No results for input(s): CHOL, HDL, LDLCALC, TRIG, CHOLHDL, LDLDIRECT in the last 72 hours. Thyroid Function Tests: No results for input(s): TSH, T4TOTAL, FREET4, T3FREE, THYROIDAB in the last 72 hours. Anemia Panel: No results for input(s): VITAMINB12, FOLATE, FERRITIN, TIBC, IRON, RETICCTPCT in the last 72 hours.    Radiology Studies: I have reviewed all of the imaging during this hospital visit personally     Scheduled Meds: . albuterol  3 mL Inhalation See admin instructions  . alfuzosin  10 mg Oral Daily  . atorvastatin  20 mg Oral Daily  . gabapentin  100 mg Oral TID  . insulin aspart  0-15 Units Subcutaneous TID WC  . lisinopril  20 mg Oral Daily  . metoprolol tartrate  25 mg Oral BID   Continuous Infusions: . diltiazem (CARDIZEM) infusion 2.5 mg/hr (06/14/18 1025)  . heparin 1,700 Units/hr (06/14/18 0317)     LOS: 0 days        Tawni Millers, MD [facility] Pager [phone]

## 2018-06-14 NOTE — Progress Notes (Signed)
[place] for heparin Indication: atrial fibrillation  Patient Measurements: Height: 6' (182.9 cm) Weight: 252 lb (114.3 kg) IBW/kg (Calculated) : 77.6 Heparin Dosing Weight: 102 kg  Vital Signs: BP: 111/70 (12/11 1030) Pulse Rate: 73 (12/11 1030)  Labs: Recent Labs    06/13/18 1515 06/14/18 0227 06/14/18 0958  HGB 12.7* 12.2*  --   HCT 40.4 38.7*  --   PLT 166 150  --   HEPARINUNFRC  --  0.20* 0.26*  CREATININE 1.01  --   --     Assessment: Sent from outpatient MD with new onset AFib. He is not on any anticoagulation prior to admission.    Heparin level came back slightly subtherapeutic at 0.26, on 1700 units/hr. Hgb 12.2, plt 150. No s/sx of bleeding.   Goal of Therapy:  Heparin level 0.3-0.7 units/ml Monitor platelets by anticoagulation protocol: Yes    Plan:  -Increase heparin infusion to 1900 units/hr -Heparin level in 6-8 hours -Daily HL, CBC  Antonietta Jewel, PharmD, BCCCP Clinical Pharmacist  Pager: [phone] Phone: [phone] 06/14/2018,11:07 AM

## 2018-06-15 LAB — BASIC METABOLIC PANEL
Anion gap: 9 (ref 5–15)
BUN: 21 mg/dL (ref 8–23)
CALCIUM: 8.4 mg/dL — AB (ref 8.9–10.3)
CO2: 25 mmol/L (ref 22–32)
CREATININE: 1.18 mg/dL (ref 0.61–1.24)
Chloride: 107 mmol/L (ref 98–111)
GFR calc Af Amer: >60 mL/min (ref >60)
GFR, EST NON AFRICAN AMERICAN: 58 mL/min — AB (ref >60)
Glucose, Bld: 154 mg/dL — ABNORMAL HIGH (ref 70–99)
Potassium: 4.6 mmol/L (ref 3.5–5.1)
Sodium: 141 mmol/L (ref 135–145)

## 2018-06-15 LAB — CBC
HCT: 40 % (ref 39.0–52.0)
Hemoglobin: 11.9 g/dL — ABNORMAL LOW (ref 13.0–17.0)
MCH: 27.3 pg (ref 26.0–34.0)
MCHC: 29.8 g/dL — ABNORMAL LOW (ref 30.0–36.0)
MCV: 91.7 fL (ref 80.0–100.0)
Platelets: 167 10*3/uL (ref 150–400)
RBC: 4.36 MIL/uL (ref 4.22–5.81)
RDW: 14.2 % (ref 11.5–15.5)
WBC: 6.1 10*3/uL (ref 4.0–10.5)
nRBC: 0 % (ref 0.0–0.2)

## 2018-06-15 LAB — GLUCOSE, CAPILLARY
Glucose-Capillary: 119 mg/dL — ABNORMAL HIGH (ref 70–99)
Glucose-Capillary: 169 mg/dL — ABNORMAL HIGH (ref 70–99)
Glucose-Capillary: 127 mg/dL — ABNORMAL HIGH (ref 70–99)
Glucose-Capillary: 151 mg/dL — ABNORMAL HIGH (ref 70–99)

## 2018-06-15 MED ORDER — DILTIAZEM 12 MG/ML ORAL SUSPENSION
30.0000 mg | Freq: Three times a day (TID) | ORAL | Status: DC
  Administered 2018-06-15: 30 mg via ORAL
  Filled 2018-06-15 (×2): qty 3

## 2018-06-15 MED ORDER — DILTIAZEM HCL 60 MG PO TABS
30.0000 mg | ORAL_TABLET | Freq: Four times a day (QID) | ORAL | Status: DC
  Administered 2018-06-15 – 2018-06-16 (×3): 30 mg via ORAL
  Filled 2018-06-15 (×4): qty 1

## 2018-06-15 NOTE — Care Management (Signed)
Patient provided with 30 day Eliquis card. Will continue to follow for results of benefits check as to what the cost will be. Will update patient on cost.

## 2018-06-15 NOTE — Progress Notes (Signed)
SATURATION QUALIFICATIONS: (This note is used to comply with regulatory documentation for home oxygen)  Patient Saturations on Room Air at Rest = 91%  Patient Saturations on Room Air while Ambulating = 88%  Patient Saturations on 2 Liters of oxygen while Ambulating = 94%  Please briefly explain why patient needs home oxygen:Pt desats on RA with activity.  May need home O2.  Thanks.  [place] Pager:  [phone]  Office:  [phone]

## 2018-06-15 NOTE — Progress Notes (Addendum)
PROGRESS NOTE    [name]  URK:[id number] DOB: [birth year] DOA: 06/13/2018 PCP: [name], MD   Brief Narrative:83 year old male who presented with new onset atrial fibrillation.  He does have significant past medical history for prostate cancer, diastolic heart failure, hypertension and pulmonary artery hypertension.  Patient reported progressive dyspnea exertion for the last 6 months, he was seen by his primary care physician who found him in atrial fibrillation with rapid ventricular response, was referred for admission for evaluation.  On his initial physical examination blood pressure 109/81, heart rate 113 219, respirate 23, oxygen saturation 88%.  Moist mucous membranes, lungs clear to auscultation bilaterally, heart S1-S2 present tachycardic, irregularly irregular, abdomen soft nontender nondistended, no lower extremity edema.  Patient was admitted to the hospital with working diagnosis of atrial fibrillation with rapid ventricular response.  Assessment & Plan:   Principal Problem:   Atrial fibrillation with RVR (HCC) Active Problems:   Diabetes mellitus without complication (HCC)   PAH (pulmonary artery hypertension) (HCC)   HTN (hypertension)   Atrial fibrillation ([place])  1. Atrial fibrillation with rapid ventricular response, complicated by acute on chronic diastolic heart failure. Patient is now off diltiazem infusion, will continue rate control with metoprolol 25 mg bid, will continue telemetry monitoring and diuresis with IV furosemide (if blood pressure allows) to target negative fluid balance. Will start patient on anticoagulation for high Chads-Vasc score 5. Echocardiogram from 04/2018 had preserved LV systolic function. Still with RVR.start cardizem.  2. HTN. On metoprolol and cardizem  3. T2DM. Will continue glucose cover and monitoring with insulin sliding scale. Patient tolerating po well.   4. Hx of CVA. Will continue asa and statin  5severe pulmonary HTN  -hypoxic on ambulation dropped sats to 88.will arrange home o2.   Severity patient is at high risk for cardiac decompensation with afib rvr severe p htn and chf    Estimated body mass index is 35.3 kg/m as calculated from the following:   Height as of this encounter: 5\' 11"  (1.803 m).   Weight as of this encounter: 114.8 kg.  DVT prophylaxis: eliquis Code Status: full Family Communication: none  Disposition Plan:pending clinical improvement  Consultants:  none  Procedures:none  Antimicrobials: none   Subjective: Resting in bed  Complains of sob  Objective: Vitals:   06/14/18 2145 06/15/18 0703 06/15/18 0750 06/15/18 1100  BP:  132/90 (!) 121/97   Pulse: (!) 119 (!) 127 (!) 123 (!) 121  Resp:  20 (!) 25   Temp:  98.1 F (36.7 C)    TempSrc:      SpO2:  92% 96%   Weight:  114.8 kg    Height:        Intake/Output Summary (Last 24 hours) at 06/15/2018 1246 Last data filed at 06/15/2018 1033 Gross per 24 hour  Intake 1163.89 ml  Output 300 ml  Net 863.89 ml   Filed Weights   06/13/18 1454 06/14/18 1416 06/15/18 0703  Weight: 114.3 kg 114.3 kg 114.8 kg    Examination:  General exam: Appears calm and comfortable  Respiratory system: Clear to auscultation. Respiratory effort normal. Cardiovascular system: S1 & S2 heard,  irreg irreg No JVD, murmurs, rubs, gallops or clicks. No pedal edema. Gastrointestinal system: Abdomen is nondistended, soft and nontender. No organomegaly or masses felt. Normal bowel sounds heard. Central nervous system: Alert and oriented. No focal neurological deficits. Extremities 1 plus edema Skin: No rashes, lesions or ulcers Psychiatry: Judgement and insight appear normal. Mood & affect appropriate.  Data Reviewed: I have personally reviewed following labs and imaging studies  CBC: Recent Labs  Lab 06/13/18 1515 06/14/18 0227 06/15/18 0330  WBC 6.4 6.1 6.1  HGB 12.7* 12.2* 11.9*  HCT 40.4 38.7* 40.0  MCV 90.8 90.8 91.7    PLT 166 150 222   Basic Metabolic Panel: Recent Labs  Lab 06/13/18 1515 06/15/18 0330  NA 139 141  K 4.0 4.6  CL 106 107  CO2 23 25  GLUCOSE 180* 154*  BUN 16 21  CREATININE 1.01 1.18  CALCIUM 8.5* 8.4*   GFR: Estimated Creatinine Clearance: 63.3 mL/min (by C-G formula based on SCr of 1.18 mg/dL). Liver Function Tests: Recent Labs  Lab 06/13/18 1515  AST 50*  ALT 42  ALKPHOS 64  BILITOT 0.8  PROT 6.7  ALBUMIN 3.5   No results for input(s): LIPASE, AMYLASE in the last 168 hours. No results for input(s): AMMONIA in the last 168 hours. Coagulation Profile: No results for input(s): INR, PROTIME in the last 168 hours. Cardiac Enzymes: No results for input(s): CKTOTAL, CKMB, CKMBINDEX, TROPONINI in the last 168 hours. BNP (last 3 results) No results for input(s): PROBNP in the last 8760 hours. HbA1C: No results for input(s): HGBA1C in the last 72 hours. CBG: Recent Labs  Lab 06/14/18 0757 06/14/18 1720 06/14/18 2143 06/15/18 0753 06/15/18 1120  GLUCAP 159* 220* 120* 119* 169*   Lipid Profile: No results for input(s): CHOL, HDL, LDLCALC, TRIG, CHOLHDL, LDLDIRECT in the last 72 hours. Thyroid Function Tests: No results for input(s): TSH, T4TOTAL, FREET4, T3FREE, THYROIDAB in the last 72 hours. Anemia Panel: No results for input(s): VITAMINB12, FOLATE, FERRITIN, TIBC, IRON, RETICCTPCT in the last 72 hours. Sepsis Labs: Recent Labs  Lab 06/13/18 1529  LATICACIDVEN 1.68    No results found for this or any previous visit (from the past 240 hour(s)).       Radiology Studies: Ct Angio Chest Pe W And/or Wo Contrast  Result Date: 06/13/2018 CLINICAL DATA:  New onset AFib EXAM: CT ANGIOGRAPHY CHEST WITH CONTRAST TECHNIQUE: Multidetector CT imaging of the chest was performed using the standard protocol during bolus administration of intravenous contrast. Multiplanar CT image reconstructions and MIPs were obtained to evaluate the vascular anatomy. CONTRAST:   167mL ISOVUE-370 IOPAMIDOL (ISOVUE-370) INJECTION 76% COMPARISON:  Chest x-ray 06/13/2018, CT chest 04/28/2018 FINDINGS: Cardiovascular: Satisfactory opacification of the pulmonary arteries to the segmental level. Poor opacification of lower order branch vessels, most notable in the right greater than left upper lobes which limits evaluation for distal emboli. No acute filling defects are seen within the central vessels. Mild aortic atherosclerosis. Aneurysmal dilatation of the ascending aorta up to 4.1 cm. Cardiomegaly. Coronary vascular calcification. No large pericardial effusion Mediastinum/Nodes: Midline trachea. No thyroid mass. No change in 15 mm low right paratracheal lymph node. Esophagus within normal limits. Lungs/Pleura: Small bilateral pleural effusions are increased compared to prior. There is no acute consolidation or pneumothorax. Bleb in the right upper lobe. Upper Abdomen: No acute abnormality. Musculoskeletal: No chest wall abnormality. No acute or significant osseous findings. Review of the MIP images confirms the above findings. IMPRESSION: 1. Limited evaluation of distal pulmonary arterial vessels due to poor opacification peripherally. No definite acute central embolus is seen. 2. Cardiomegaly.  Small bilateral pleural effusions Aortic Atherosclerosis (ICD10-I70.0). Electronically Signed   By: Donavan Foil M.D.   On: 06/13/2018 19:15   Dg Chest Port 1 View  Result Date: 06/13/2018 CLINICAL DATA:  Chronic six-month history of shortness of breath. Personal  history of prostate cancer diagnosed in 2005. Current history of hypertension and prediabetes. EXAM: PORTABLE CHEST 1 VIEW COMPARISON:  CTA chest 04/28/2018. Chest x-rays 04/27/2018 and earlier. FINDINGS: Cardiac silhouette moderately enlarged, unchanged. Thoracic aorta tortuous and mildly atherosclerotic, unchanged. Hilar and mediastinal contours otherwise unremarkable. Scarring at the LEFT lung base, unchanged. Lungs otherwise clear.  Pulmonary vascularity normal. No visible pleural effusions. IMPRESSION: Stable cardiomegaly without pulmonary edema. Stable scarring at the LEFT lung base. No acute cardiopulmonary disease. Electronically Signed   By: Evangeline Dakin M.D.   On: 06/13/2018 15:36        Scheduled Meds: . albuterol  3 mL Inhalation See admin instructions  . alfuzosin  10 mg Oral Daily  . apixaban  5 mg Oral BID  . atorvastatin  20 mg Oral Daily  . diltiazem  30 mg Oral Q6H  . gabapentin  100 mg Oral TID  . insulin aspart  0-15 Units Subcutaneous TID WC  . metoprolol tartrate  25 mg Oral BID   Continuous Infusions: . sodium chloride       LOS: 1 day     Georgette Shell, MD [facility] If 7PM-7AM, please contact night-coverage [email or web address] Password [email or web address] 06/15/2018, 12:46 PM

## 2018-06-15 NOTE — Care Management (Signed)
3.  S/W   JESSIE  @ [facility] # [phone]   APIXABAN :  NONE FORMULARY  1. ELIQUIS    5 MG BID COVER- YES CO-PAY- $ 45.00    TWO PILL PER DAY TIER- 3 DRUG PRIOR APPROVAL- NO   2. ELIQUIS  2.5 MG  BID COVER- YES CO-PAY- $ 45.00    Q/L TWO PILL PER DAY TIER- 3 DRUG PRIOR APPROVAL- NO  DEDUCTIBLE: NOT MET  PREFERRED PHARMACY : YES   [facility] M/O 90 DAY SUPPLY   FOR M/O   $ 125.00

## 2018-06-15 NOTE — Evaluation (Signed)
Physical Therapy Evaluation Patient Details Name: [name] MRN: [id number] DOB: 23-Aug-1934 Today's Date: 06/15/2018   History of Present Illness  83 year old male who presented with new onset atrial fibrillation.  He does have significant past medical history for prostate cancer, diastolic heart failure, hypertension, diabetes, and pulmonary artery hypertension.  Patient reported progressive dyspnea exertion for the last 6 months, he was seen by his primary care physician who found him in atrial fibrillation with rapid ventricular response, was referred for admission for evaluation.    Clinical Impression  Pt admitted with above diagnosis. Pt currently with functional limitations due to the deficits listed below (see PT Problem List). Pt was able to ambulate without device with overall stability.  Did desat to 88% with DOE 2/4 with activity.  Will follow acutely as pt has several flights of steps at home and will need to practice.   Pt will benefit from skilled PT to increase their independence and safety with mobility to allow discharge to the venue listed below.    Patient Saturations on Room Air at Rest = 91%  Patient Saturations on [employer] while Ambulating = 88%  Patient Saturations on 2 Liters of oxygen while Ambulating = 94%  Please briefly explain why patient needs home oxygen:Pt desats on RA with activity.  May need home O2.  Follow Up Recommendations No PT follow up    Equipment Recommendations  None recommended by PT    Recommendations for Other Services       Precautions / Restrictions Precautions Precautions: Fall Restrictions Weight Bearing Restrictions: No      Mobility  Bed Mobility Overal bed mobility: Independent                Transfers Overall transfer level: Independent                  Ambulation/Gait Ambulation/Gait assistance: Supervision;Min guard Gait Distance (Feet): 450 Feet Assistive device: None Gait Pattern/deviations:  Step-through pattern;Decreased stride length   Gait velocity interpretation: 1.31 - 2.62 ft/sec, indicative of limited community ambulator General Gait Details: Pt with overall steady gait.  No LOB with min to mod challenges.  Did desat to 88% on RA but not below that.    Stairs            Wheelchair Mobility    Modified Rankin (Stroke Patients Only)       Balance Overall balance assessment: Needs assistance Sitting-balance support: No upper extremity supported;Feet supported Sitting balance-Leahy Scale: Fair     Standing balance support: No upper extremity supported;During functional activity Standing balance-Leahy Scale: Fair Standing balance comment: can stand statically without support.  Dynamic balance with minguard assist to supervision.                             Pertinent Vitals/Pain Pain Assessment: No/denies pain    Home Living Family/patient expects to be discharged to:: Private residence Living Arrangements: Alone Available Help at Discharge: Family;Available PRN/intermittently Type of Home: House(runs air BNB) Home Access: Stairs to enter Entrance Stairs-Rails: Right;Left Entrance Stairs-Number of Steps: several Home Layout: Multi-level Home Equipment: Grab bars - toilet      Prior Function Level of Independence: Independent               Hand Dominance   Dominant Hand: Right    Extremity/Trunk Assessment   Upper Extremity Assessment Upper Extremity Assessment: Defer to OT evaluation    Lower Extremity Assessment  Lower Extremity Assessment: Generalized weakness    Cervical / Trunk Assessment Cervical / Trunk Assessment: Normal  Communication   Communication: No difficulties  Cognition Arousal/Alertness: Awake/alert Behavior During Therapy: WFL for tasks assessed/performed Overall Cognitive Status: Within Functional Limits for tasks assessed                                        General Comments       Exercises General Exercises - Lower Extremity Ankle Circles/Pumps: AROM;Both;10 reps;Seated Long Arc Quad: AROM;Both;10 reps;Seated   Assessment/Plan    PT Assessment Patient needs continued PT services  PT Problem List Decreased activity tolerance;Decreased balance;Decreased mobility;Decreased knowledge of use of DME;Decreased safety awareness;Decreased knowledge of precautions       PT Treatment Interventions DME instruction;Gait training;Functional mobility training;Therapeutic activities;Therapeutic exercise;Balance training;Patient/family education;Stair training    PT Goals (Current goals can be found in the Care Plan section)  Acute Rehab PT Goals Patient Stated Goal: to go home PT Goal Formulation: With patient Time For Goal Achievement: 06/29/18 Potential to Achieve Goals: Good    Frequency Min 3X/week   Barriers to discharge        Co-evaluation               AM-PAC PT "6 Clicks" Mobility  Outcome Measure Help needed turning from your back to your side while in a flat bed without using bedrails?: None Help needed moving from lying on your back to sitting on the side of a flat bed without using bedrails?: None Help needed moving to and from a bed to a chair (including a wheelchair)?: None Help needed standing up from a chair using your arms (e.g., wheelchair or bedside chair)?: None Help needed to walk in hospital room?: A Little Help needed climbing 3-5 steps with a railing? : A Little 6 Click Score: 22    End of Session Equipment Utilized During Treatment: Gait belt;Oxygen Activity Tolerance: Patient limited by fatigue Patient left: in chair;with call bell/phone within reach Nurse Communication: Mobility status PT Visit Diagnosis: Muscle weakness (generalized) (M62.81)    Time: 3295-1884 PT Time Calculation (min) (ACUTE ONLY): 16 min   Charges:   PT Evaluation $PT Eval Moderate Complexity: 1 Mod          Syre Knerr,PT Acute  Rehabilitation Services Pager:  [phone]  Office:  [place] 06/15/2018, 1:46 PM

## 2018-06-16 ENCOUNTER — Other Ambulatory Visit: Payer: Self-pay | Admitting: Cardiology

## 2018-06-16 DIAGNOSIS — I4891 Unspecified atrial fibrillation: Principal | ICD-10-CM

## 2018-06-16 DIAGNOSIS — I2721 Secondary pulmonary arterial hypertension: Secondary | ICD-10-CM

## 2018-06-16 DIAGNOSIS — I5033 Acute on chronic diastolic (congestive) heart failure: Secondary | ICD-10-CM

## 2018-06-16 DIAGNOSIS — I10 Essential (primary) hypertension: Secondary | ICD-10-CM

## 2018-06-16 LAB — CBC
HCT: 42.2 % (ref 39.0–52.0)
Hemoglobin: 12.6 g/dL — ABNORMAL LOW (ref 13.0–17.0)
MCH: 27.5 pg (ref 26.0–34.0)
MCHC: 29.9 g/dL — ABNORMAL LOW (ref 30.0–36.0)
MCV: 92.1 fL (ref 80.0–100.0)
Platelets: 171 10*3/uL (ref 150–400)
RBC: 4.58 MIL/uL (ref 4.22–5.81)
RDW: 14.5 % (ref 11.5–15.5)
WBC: 6.4 10*3/uL (ref 4.0–10.5)
nRBC: 0 % (ref 0.0–0.2)

## 2018-06-16 LAB — TSH: TSH: 1.859 u[IU]/mL (ref 0.350–4.500)

## 2018-06-16 LAB — GLUCOSE, CAPILLARY
Glucose-Capillary: 129 mg/dL — ABNORMAL HIGH (ref 70–99)
Glucose-Capillary: 180 mg/dL — ABNORMAL HIGH (ref 70–99)
Glucose-Capillary: 120 mg/dL — ABNORMAL HIGH (ref 70–99)
Glucose-Capillary: 205 mg/dL — ABNORMAL HIGH (ref 70–99)

## 2018-06-16 MED ORDER — AMIODARONE HCL IN DEXTROSE 360-4.14 MG/200ML-% IV SOLN
30.0000 mg/h | INTRAVENOUS | Status: DC
  Administered 2018-06-17 – 2018-06-19 (×5): 30 mg/h via INTRAVENOUS
  Filled 2018-06-16 (×8): qty 200

## 2018-06-16 MED ORDER — SODIUM CHLORIDE 0.9 % IV SOLN
250.0000 mL | INTRAVENOUS | Status: DC
  Administered 2018-06-19: 250 mL via INTRAVENOUS

## 2018-06-16 MED ORDER — SODIUM CHLORIDE 0.9% FLUSH
3.0000 mL | Freq: Two times a day (BID) | INTRAVENOUS | Status: DC
  Administered 2018-06-19: 3 mL via INTRAVENOUS

## 2018-06-16 MED ORDER — AMIODARONE HCL IN DEXTROSE 360-4.14 MG/200ML-% IV SOLN
60.0000 mg/h | INTRAVENOUS | Status: AC
  Administered 2018-06-16 (×2): 60 mg/h via INTRAVENOUS

## 2018-06-16 MED ORDER — SODIUM CHLORIDE 0.9% FLUSH: 3.0000 mL | INTRAVENOUS | Status: DC | PRN

## 2018-06-16 NOTE — Discharge Instructions (Addendum)
Follow with Primary MD [name], MD in 7 days   Get CBC, CMP, 2 view Chest X ray -  checked  by Primary MD in 5-7 days   Activity: As tolerated with Full fall precautions use walker/cane & assistance as needed  Disposition Home    Diet: Heart Healthy & low carbohydrate diet, 1.5 L/day total fluid restriction.  Special Instructions: If you have smoked or chewed Tobacco  in the last 2 yrs please stop smoking, stop any regular Alcohol  and or any Recreational drug use.  On your next visit with your primary care physician please Get Medicines reviewed and adjusted.  Please request your Prim.MD to go over all Hospital Tests and Procedure/Radiological results at the follow up, please get all Hospital records sent to your Prim MD by signing hospital release before you go home.  If you experience worsening of your admission symptoms, develop shortness of breath, life threatening emergency, suicidal or homicidal thoughts you must seek medical attention immediately by calling 911 or calling your MD immediately  if symptoms less severe.  You Must read complete instructions/literature along with all the possible adverse reactions/side effects for all the Medicines you take and that have been prescribed to you. Take any new Medicines after you have completely understood and accpet all the possible adverse reactions/side effects.       Information on my medicine - ELIQUIS (apixaban)  This medication education was reviewed with me or my healthcare representative as part of my discharge preparation.  The pharmacist that spoke with me during my hospital stay was:  Einar Grad, [facility]  Why was Eliquis prescribed for you? Eliquis was prescribed for you to reduce the risk of a blood clot forming that can cause a stroke if you have a medical condition called atrial fibrillation (a type of irregular heartbeat).  What do You need to know about Eliquis ? Take your Eliquis TWICE DAILY - one tablet  in the morning and one tablet in the evening with or without food. If you have difficulty swallowing the tablet whole please discuss with your pharmacist how to take the medication safely.  Take Eliquis exactly as prescribed by your doctor and DO NOT stop taking Eliquis without talking to the doctor who prescribed the medication.  Stopping may increase your risk of developing a stroke.  Refill your prescription before you run out.  After discharge, you should have regular check-up appointments with your healthcare provider that is prescribing your Eliquis.  In the future your dose may need to be changed if your kidney function or weight changes by a significant amount or as you get older.  What do you do if you miss a dose? If you miss a dose, take it as soon as you remember on the same day and resume taking twice daily.  Do not take more than one dose of ELIQUIS at the same time to make up a missed dose.  Important Safety Information A possible side effect of Eliquis is bleeding. You should call your healthcare provider right away if you experience any of the following: ? Bleeding from an injury or your nose that does not stop. ? Unusual colored urine (red or dark brown) or unusual colored stools (red or black). ? Unusual bruising for unknown reasons. ? A serious fall or if you hit your head (even if there is no bleeding).  Some medicines may interact with Eliquis and might increase your risk of bleeding or clotting while on Eliquis.  To help avoid this, consult your healthcare provider or pharmacist prior to using any new prescription or non-prescription medications, including herbals, vitamins, non-steroidal anti-inflammatory drugs (NSAIDs) and supplements.  This website has more information on Eliquis (apixaban): http://www.eliquis.com/eliquis/home

## 2018-06-16 NOTE — Progress Notes (Signed)
BP 87/71, rechecked 75/61, HR 102 afib.  Denies dizziness.  Dr Zigmund Daniel notified.  Cardizem held.  Will continue to monitor.

## 2018-06-16 NOTE — Progress Notes (Signed)
PROGRESS NOTE    [name]  GOT:[id number] DOB: [birth year] DOA: 06/13/2018 PCP: [name], MD    Brief Narrative::83 year old male who presented with new onset atrial fibrillation. He does have significant past medical history for prostate cancer, diastolic heart failure, hypertension and pulmonary artery hypertension. Patient reported progressive dyspnea exertion for the last 6 months, he was seen by his primary care physician who found him in atrial fibrillation with rapid ventricular response, was referred for admission for evaluation. On his initial physical examination blood pressure 109/81, heart rate 113 219, respirate 23, oxygen saturation 88%. Moist mucous membranes, lungs clear to auscultation bilaterally, heart S1-S2 present tachycardic, irregularly irregular,abdomen soft nontender nondistended, no lower extremity edema.  Patient was admitted to the hospital with working diagnosis of atrial fibrillation with rapid ventricular response.   Assessment & Plan:   Principal Problem:   Atrial fibrillation with RVR (HCC) Active Problems:   Diabetes mellitus without complication (HCC)   PAH (pulmonary artery hypertension) (HCC)   HTN (hypertension)   Atrial fibrillation ([place])   1. Atrial fibrillation with rapid ventricular response, complicated by acute on chronic diastolic heart failure. Patient is now off diltiazem infusion, will continue rate control with metoprolol 25 mg bid, will continue telemetry monitoring and diuresis with IV furosemide (if blood pressure allows) to target negative fluid balance. Will start patient on anticoagulation for high Chads-Vasc score 5. Echocardiogram from 04/2018 had preserved LV systolic function. Still with RVR.started cardizem 30 q6..still in RVR.may need to add amio.?Cardiology consulted.  2. HTN. On metoprolol and cardizem  3. T2DM. Will continue glucose cover and monitoring with insulin sliding scale. Patient tolerating po well.     4. Hx of CVA. Will continue asa and statin  5severe pulmonary HTN -hypoxic on ambulation dropped sats to 88.will arrange home o2.   Severity patient is at high risk for cardiac decompensation with afib rvr severe p htn and chf    Estimated body mass index is 35.39 kg/m as calculated from the following:   Height as of this encounter: 5\' 11"  (1.803 m).   Weight as of this encounter: 115.1 kg.  DVT prophylaxis: eliquis Code Status:full Family Communication:he has no family Disposition Plan pending improvement in RVR  Consultants:   cardiology Procedures: none Antimicrobials: none Subjective:feels about the same as a month ago tired    Objective: Vitals:   06/15/18 2309 06/16/18 0545 06/16/18 0546 06/16/18 0902  BP: 110/70 98/84 98/84  109/66  Pulse:  (!) 129  90  Resp:  18    Temp:  97.7 F (36.5 C)    TempSrc:  Oral    SpO2:  92%    Weight:   115.1 kg   Height:        Intake/Output Summary (Last 24 hours) at 06/16/2018 1015 Last data filed at 06/16/2018 0500 Gross per 24 hour  Intake 355 ml  Output 300 ml  Net 55 ml   Filed Weights   06/14/18 1416 06/15/18 0703 06/16/18 0546  Weight: 114.3 kg 114.8 kg 115.1 kg    Examination:  General exam: Appears calm and comfortable  Respiratory system: Clear to auscultation. Respiratory effort normal. Cardiovascular system: S1 & S2 heard,  irreg irregNo JVD, murmurs, rubs, gallops or clicks. No pedal edema. Gastrointestinal system: Abdomen is nondistended, soft and nontender. No organomegaly or masses felt. Normal bowel sounds heard. Central nervous system: Alert and oriented. No focal neurological deficits. Extremities: 1 plus edema Skin: No rashes, lesions or ulcers Psychiatry: Judgement and insight appear  normal. Mood & affect appropriate.     Data Reviewed: I have personally reviewed following labs and imaging studies  CBC: Recent Labs  Lab 06/13/18 1515 06/14/18 0227 06/15/18 0330 06/16/18 0558   WBC 6.4 6.1 6.1 6.4  HGB 12.7* 12.2* 11.9* 12.6*  HCT 40.4 38.7* 40.0 42.2  MCV 90.8 90.8 91.7 92.1  PLT 166 150 167 237   Basic Metabolic Panel: Recent Labs  Lab 06/13/18 1515 06/15/18 0330  NA 139 141  K 4.0 4.6  CL 106 107  CO2 23 25  GLUCOSE 180* 154*  BUN 16 21  CREATININE 1.01 1.18  CALCIUM 8.5* 8.4*   GFR: Estimated Creatinine Clearance: 63.3 mL/min (by C-G formula based on SCr of 1.18 mg/dL). Liver Function Tests: Recent Labs  Lab 06/13/18 1515  AST 50*  ALT 42  ALKPHOS 64  BILITOT 0.8  PROT 6.7  ALBUMIN 3.5   No results for input(s): LIPASE, AMYLASE in the last 168 hours. No results for input(s): AMMONIA in the last 168 hours. Coagulation Profile: No results for input(s): INR, PROTIME in the last 168 hours. Cardiac Enzymes: No results for input(s): CKTOTAL, CKMB, CKMBINDEX, TROPONINI in the last 168 hours. BNP (last 3 results) No results for input(s): PROBNP in the last 8760 hours. HbA1C: No results for input(s): HGBA1C in the last 72 hours. CBG: Recent Labs  Lab 06/15/18 0753 06/15/18 1120 06/15/18 1612 06/15/18 2134 06/16/18 0738  GLUCAP 119* 169* 127* 151* 129*   Lipid Profile: No results for input(s): CHOL, HDL, LDLCALC, TRIG, CHOLHDL, LDLDIRECT in the last 72 hours. Thyroid Function Tests: No results for input(s): TSH, T4TOTAL, FREET4, T3FREE, THYROIDAB in the last 72 hours. Anemia Panel: No results for input(s): VITAMINB12, FOLATE, FERRITIN, TIBC, IRON, RETICCTPCT in the last 72 hours. Sepsis Labs: Recent Labs  Lab 06/13/18 1529  LATICACIDVEN 1.68    No results found for this or any previous visit (from the past 240 hour(s)).       Radiology Studies: No results found.      Scheduled Meds: . albuterol  3 mL Inhalation See admin instructions  . alfuzosin  10 mg Oral Daily  . apixaban  5 mg Oral BID  . atorvastatin  20 mg Oral Daily  . diltiazem  30 mg Oral Q6H  . gabapentin  100 mg Oral TID  . insulin aspart  0-15  Units Subcutaneous TID WC  . metoprolol tartrate  25 mg Oral BID   Continuous Infusions: . sodium chloride       LOS: 2 days     Georgette Shell, MD [facility] If 7PM-7AM, please contact night-coverage [email or web address] Password [email or web address] 06/16/2018, 10:15 AM

## 2018-06-16 NOTE — Care Management Important Message (Signed)
Important Message  Patient Details  Name: [name] MRN: [id number] Date of Birth: 07-06-1934   Medicare Important Message Given:  Yes    Dailon Sheeran P Luvia Orzechowski 06/16/2018, 1:05 PM

## 2018-06-16 NOTE — Consult Note (Addendum)
Cardiology Consultation:   Patient ID: [name] MRN: [id number]; DOB: 07/01/1935  Admit date: 06/13/2018 Date of Consult: 06/16/2018  Primary Care Provider: [name], MD Primary Cardiologist: Sherren Mocha, MD  Primary Electrophysiologist:  Thompson Grayer, MD    Patient Profile:   [name] is a 83 y.o. male with a hx of neurocardiogenic syncope in 6967, HTN, diastolic HF, pulmonary hypertension, h/o CVA, T2DM,  h/o prostate CA and PUD and who is being seen today for the evaluation of atrial fibrillation, at the request of Dr. Zigmund Daniel, Internal Medicine.   History of Present Illness:   [name] is a 83 y.o. male with a hx of HTN, T2DM, CVA, Prostate CA, PUD and OSA.  He was admitted in 4/16 with syncope.  Tele demonstrated sinus brady.  He was seen by Dr. Sherren Mocha who felt his syncope was vasovagal/ neurocardiogenic in nature.   Given bradycardia, his Verapamil was DC'd.  Carvedilol dose was reduced.  Echo demonstrated normal LVF, mild diastolic dysfunction, mild AI, mild MR, mod LAE.  Old lacunar infarct was noted on brain MRI.  Carotid US demonstrated no significant ICA stenosis (1-39% bilaterally).  OP event monitor was arranged.  This showed some NSVT.  Patient was set up for a nuclear stress test.  This was low risk and neg for ischemia. Pt was then referred to Dr. Rayann Heman for evaluation. Dr. Rayann Heman recommended observation in the setting of normal LVF and low risk nuclear scan. Pt has not been seen since 2016. He was lost to f/u.   Pt was admitted to the [facility] hospital this past October with dyspnea and found to be in acute CHF. This was in the setting poor compliance with his antihypertensives. His BP was noted to be elevated at time of admit. He was admitted by IM, diuresed with Lasix and BP meds adjusted. Amlodipine increased to 10 mg and lisinopril continued at 20 mg. Once diuresed with IV Lasix, he was advised to take PO lasix PRN based on weight gain.  2D echo that  admit showed normal LVEF at 55-60% and G2DD and severely increased PA pressure at 75 mm Hg. There was also moderate MR. The LA was severely dilated.   He was seen by his PCP on 06/13/18 for regular f/u and found to be in new onset atrial fibrillation and was referred to the [facility] ED.   Pt reports that he has felt short of breath for the past several months and more tired. No CP. OSA is listed in his PMH, but pt not on CPAP. No ETOH. Has h/o PUD and surgery in [place] ~50 years ago but no issues with ulcers in over 20 years. He lives home along. 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   He remains in atrial fibrillation w/ RVR in the 110s-120s. SBPs have been soft in the 90s-110s. He is currently on PO Cardizem 30 mg Q6H and metoprolol tartrate 25 mg BID. He just feels tired.     Past Medical History:  Diagnosis Date  . Abnormal PSA 01/17/2012   10.38/4/5/ PSA:2013=6.96/ PSA 07/08/2011=6.26/ PSA 2006=8.6  . Blood transfusion without reported diagnosis    during vagotomy 1976  . Cancer [facility]) 2006   L-2 solitary metastatic lesion/no rad tx ,asymptomatic  . Chronic kidney disease    nephrolithiasis right kidney  . Gynecomastia 04/21/11   seen by Dr.Murray, no rad tx  . History of echocardiogram    echo 4/16:  mild LVH, EF 55-60%, Gr  1 DD, no RWMA, mild AI, mild MR, mod LAE, mild TR  . Hx of cardiovascular stress test    Myoview 5/16:  EF 50%, no scar or ischemia, low risk  . Hydroureteronephrosis    right   . Hypertension   . Osteopenia   . Overweight   . Prostate cancer ([place]) 12/09/03 dx   Prostate ca/adenocarcinoma,gleason=3+3=6    pSA 4.6  . Sleep apnea    pt unaware  . Stroke [facility])    asymptomatic, found on head imaging  . Ulcer    peptic  . Use of leuprolide acetate (Lupron) 06/2005-11/2006   02/2008 degarelix 02/2008 x 3 doses    Past Surgical History:  Procedure Laterality Date  . PROSTATE BIOPSY  12/09/03   adenocarcinoma,glerason: 3+3=6  . VAGOTOMY  1978   partial      Home Medications:  Prior to Admission medications   Medication Sig Start Date End Date Taking? Authorizing Provider  alfuzosin (UROXATRAL) 10 MG 24 hr tablet Take 10 mg by mouth daily. 03/08/18  Yes [provider]  amLODipine (NORVASC) 10 MG tablet Take 1 tablet (10 mg total) by mouth daily. 04/29/18 06/13/18 Yes Arrien, Jimmy Picket, MD  atorvastatin (LIPITOR) 20 MG tablet Take 1 tablet (20 mg total) by mouth daily. 04/29/18 06/13/18 Yes Arrien, Jimmy Picket, MD  Calcium-Vitamin D (CALTRATE 600 PLUS-VIT D PO) Take 1 tablet by mouth daily.    Yes [provider]  furosemide (LASIX) 20 MG tablet Take 1 tablet (20 mg total) by mouth daily as needed for up to 20 days for fluid or edema (as needed in case of swelling, or weight gain 3 lbs in 24 hours or 5lbs in seven days.). Patient taking differently: Take 20 mg by mouth daily as needed for fluid or edema (as needed in case of swelling, or weight gain 3 lbs in 24 hours or 5lbs in seven days).  04/29/18 06/13/18 Yes Arrien, Jimmy Picket, MD  gabapentin (NEURONTIN) 100 MG capsule Take 1 capsule (100 mg total) by mouth 3 (three) times daily. 04/29/18 06/13/18 Yes Arrien, Jimmy Picket, MD  lisinopril (PRINIVIL,ZESTRIL) 20 MG tablet Take 20 mg by mouth daily. 02/06/18  Yes [provider]  metFORMIN (GLUCOPHAGE) 500 MG tablet Take 1 tablet (500 mg total) by mouth 2 (two) times daily with a meal. 04/29/18 06/13/18 Yes Arrien, Jimmy Picket, MD  potassium chloride SA (K-DUR,KLOR-CON) 20 MEQ tablet Take 1 tablet (20 mEq total) by mouth daily as needed for up to 20 days (Take only if taking furosemide (Lasix)). Patient taking differently: Take 20 mEq by mouth daily as needed (only if taking Furosemide).  04/29/18 06/13/18 Yes Arrien, Jimmy Picket, MD  PROAIR HFA 108 [phone] Base) MCG/ACT inhaler Inhale 2 puffs into the lungs See admin instructions. Inhale 2 puffs into lungs every 4-6 hours as needed for shortness of breath  or wheezing 05/26/18  Yes [provider]  Propylene Glycol (SYSTANE COMPLETE) 0.6 % SOLN Place 1-2 drops into both eyes 3 (three) times daily as needed (for dryness).   Yes [provider]    Inpatient Medications: Scheduled Meds: . albuterol  3 mL Inhalation See admin instructions  . alfuzosin  10 mg Oral Daily  . apixaban  5 mg Oral BID  . atorvastatin  20 mg Oral Daily  . diltiazem  30 mg Oral Q6H  . gabapentin  100 mg Oral TID  . insulin aspart  0-15 Units Subcutaneous TID WC  . metoprolol tartrate  25  mg Oral BID   Continuous Infusions: . sodium chloride     PRN Meds: acetaminophen, ondansetron (ZOFRAN) IV, polyvinyl alcohol  Allergies:    Allergies  Allergen Reactions  . Aspirin Other (See Comments)    History of "stomach ulcers," was told to not take this     Social History:   Social History   Socioeconomic History  . Marital status: Married    Spouse name: Not on file  . Number of children: 2  . Years of education: Not on file  . 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 Financial resource strain: Not on file  . Food insecurity:    Worry: Not on file    Inability: Not on file  . Transportation needs:    Medical: Not on file    Non-medical: Not on file  Tobacco Use  . Smoking status: Former Smoker    Types: Pipe    Last attempt to quit: 07/05/1984    Years since quitting: 33.9  . Smokeless tobacco: Never Used  Substance and Sexual Activity  . Alcohol use: No  . Drug use: No  . Sexual activity: Never  Lifestyle  . Physical activity:    Days per week: Not on file    Minutes per session: Not on file  . Stress: Not on file  Relationships  . Social connections:    Talks on phone: Not on file    Gets together: Not on file    Attends religious service: Not on file    Active member of club or organization: Not on file    Attends meetings of clubs or organizations: Not on  file    Relationship status: Not on file  . Intimate partner violence:    Fear of current or ex partner: Not on file    Emotionally abused: Not on file    Physically abused: Not on file    Forced sexual activity: Not on file  Other Topics Concern  . Not on file  Social History Narrative   Pt lives in [place], alone.  Widower.   Retired Animator.    Family History:    Family History  Problem Relation Age of Onset  . Colon cancer Neg Hx   . Stroke Neg Hx      ROS:  Please see the history of present illness.   All other ROS reviewed and negative.     Physical Exam/Data:   Vitals:   06/16/18 1130 06/16/18 1131 06/16/18 1137 06/16/18 1406  BP: (!) 87/71 (!) 75/61 (!) 87/71   Pulse:    87  Resp:      Temp:    97.8 F (36.6 C)  TempSrc:    Oral  SpO2:    94%  Weight:      Height:        Intake/Output Summary (Last 24 hours) at 06/16/2018 1518 Last data filed at 06/16/2018 1411 Gross per 24 hour  Intake 480 ml  Output 600 ml  Net -120 ml   Filed Weights   06/14/18 1416 06/15/18 0703 06/16/18 0546  Weight: 114.3 kg 114.8 kg 115.1 kg   Body mass index is 35.39 kg/m.  General:  83 y/o WM, looks younger that actual age, obese in no acute distress HEENT: normal Lymph: no adenopathy Neck: no JVD Endocrine:  No thryomegaly Vascular: No carotid bruits; FA pulses 2+ bilaterally without bruits  Cardiac:  Irregularly, irregularly rhythm, tachy rate Lungs:  clear to auscultation bilaterally, no wheezing, rhonchi or rales  Abd: obese, old midline surgical scar present, soft, nontender, no hepatomegaly  Ext: 1-2+ bilateral LEE  Musculoskeletal:  No deformities, BUE and BLE strength normal and equal Skin: warm and dry  Neuro:  CNs 2-12 intact, no focal abnormalities noted Psych:  Normal affect   EKG:  The EKG was personally reviewed and demonstrates:  06/13/18 Atrial fibrillation 106 bpm Telemetry:  Telemetry was personally reviewed and demonstrates:  Atrial  fibrillation w/ RVR 110s-120s  Relevant CV Studies: 2D echo 04/2018 Study Conclusions  - Left ventricle: The cavity size was normal. Wall thickness was   increased in a pattern of moderate LVH. Systolic function was   normal. The estimated ejection fraction was in the range of 55%   to 60%. Features are consistent with a pseudonormal left   ventricular filling pattern, with concomitant abnormal relaxation   and increased filling pressure (grade 2 diastolic dysfunction). - Aortic valve: There was mild regurgitation. - Mitral valve: There was moderate regurgitation. - Left atrium: The atrium was severely dilated. - Right atrium: The atrium was mildly dilated. - Pulmonary arteries: Systolic pressure was severely increased. PA   peak pressure: 75 mm Hg (S).  Laboratory Data:  Chemistry Recent Labs  Lab 06/13/18 1515 06/15/18 0330  NA 139 141  K 4.0 4.6  CL 106 107  CO2 23 25  GLUCOSE 180* 154*  BUN 16 21  CREATININE 1.01 1.18  CALCIUM 8.5* 8.4*  GFRNONAA >60 58*  GFRAA >60 >60  ANIONGAP 10 9    Recent Labs  Lab 06/13/18 1515  PROT 6.7  ALBUMIN 3.5  AST 50*  ALT 42  ALKPHOS 64  BILITOT 0.8   Hematology Recent Labs  Lab 06/14/18 0227 06/15/18 0330 06/16/18 0558  WBC 6.1 6.1 6.4  RBC 4.26 4.36 4.58  HGB 12.2* 11.9* 12.6*  HCT 38.7* 40.0 42.2  MCV 90.8 91.7 92.1  MCH 28.6 27.3 27.5  MCHC 31.5 29.8* 29.9*  RDW 14.2 14.2 14.5  PLT 150 167 171   Cardiac EnzymesNo results for input(s): TROPONINI in the last 168 hours.  Recent Labs  Lab 06/13/18 1527  TROPIPOC 0.05    BNP Recent Labs  Lab 06/13/18 1515  BNP 336.7*    DDimer No results for input(s): DDIMER in the last 168 hours.  Radiology/Studies:  Ct Angio Chest Pe W And/or Wo Contrast  Result Date: 06/13/2018 CLINICAL DATA:  New onset AFib EXAM: CT ANGIOGRAPHY CHEST WITH CONTRAST TECHNIQUE: Multidetector CT imaging of the chest was performed using the standard protocol during bolus  administration of intravenous contrast. Multiplanar CT image reconstructions and MIPs were obtained to evaluate the vascular anatomy. CONTRAST:  130mL ISOVUE-370 IOPAMIDOL (ISOVUE-370) INJECTION 76% COMPARISON:  Chest x-ray 06/13/2018, CT chest 04/28/2018 FINDINGS: Cardiovascular: Satisfactory opacification of the pulmonary arteries to the segmental level. Poor opacification of lower order branch vessels, most notable in the right greater than left upper lobes which limits evaluation for distal emboli. No acute filling defects are seen within the central vessels. Mild aortic atherosclerosis. Aneurysmal dilatation of the ascending aorta up to 4.1 cm. Cardiomegaly. Coronary vascular calcification. No large pericardial effusion Mediastinum/Nodes: Midline trachea. No thyroid mass. No change in 15 mm low right paratracheal lymph node. Esophagus within normal limits. Lungs/Pleura: Small bilateral pleural effusions are increased compared to prior. There is no acute consolidation or pneumothorax. Bleb in the right upper lobe. Upper Abdomen: No acute abnormality.  Musculoskeletal: No chest wall abnormality. No acute or significant osseous findings. Review of the MIP images confirms the above findings. IMPRESSION: 1. Limited evaluation of distal pulmonary arterial vessels due to poor opacification peripherally. No definite acute central embolus is seen. 2. Cardiomegaly.  Small bilateral pleural effusions Aortic Atherosclerosis (ICD10-I70.0). Electronically Signed   By: Donavan Foil M.D.   On: 06/13/2018 19:15   Dg Chest Port 1 View  Result Date: 06/13/2018 CLINICAL DATA:  Chronic six-month history of shortness of breath. Personal history of prostate cancer diagnosed in 2005. Current history of hypertension and prediabetes. EXAM: PORTABLE CHEST 1 VIEW COMPARISON:  CTA chest 04/28/2018. Chest x-rays 04/27/2018 and earlier. FINDINGS: Cardiac silhouette moderately enlarged, unchanged. Thoracic aorta tortuous and mildly  atherosclerotic, unchanged. Hilar and mediastinal contours otherwise unremarkable. Scarring at the LEFT lung base, unchanged. Lungs otherwise clear. Pulmonary vascularity normal. No visible pleural effusions. IMPRESSION: Stable cardiomegaly without pulmonary edema. Stable scarring at the LEFT lung base. No acute cardiopulmonary disease. Electronically Signed   By: Evangeline Dakin M.D.   On: 06/13/2018 15:36    Assessment and Plan:   [name] is a 83 y.o. male with a hx of neurocardiogenic syncope in 1497, HTN, diastolic HF, pulmonary hypertension, CVA, T2DM, h/o prostate CA and PUD and who is being seen today for the evaluation of atrial fibrillation, at the request of Dr. Zigmund Daniel, Internal Medicine.   1.  Atrial Fibrillation: new onset. Diagnosed at PCP office 06/13/18. RVR on arrival to the ED. Electrolytes have been stable. UA negative. CXR with no active disease. Recent echo 04/2018 showed normal LVEF, G2DD, severe pulmonary artery pressure, moderate MR, severely dilated LA, mildly dilated RA. No CP. He has reported h/o OSA in his chart but not on CPAP at home. He is currently on rate control therapy but upward titration is limited by soft BP. He remains in atrial fibrillation w/ RVR in the 110s-120s. SBPs have been soft in the 90s-110s. He is currently on PO Cardizem 30 mg Q6H and metoprolol tartrate 25 mg BID. Eliquis has been started for a/c. CHA2DS2 VASc score is 7 (CHF, HTN, DM, Age >75 (2), CVA (2)). This patients CHA2DS2-VASc Score and unadjusted Ischemic Stroke Rate (% per year) is equal to 11.2 % stroke rate/year from a score of 7.  - He needs better rate control. His BP limits titration of Cardizem and metoprolol. We will stop Cardizem and continue metoprolol. We will add IV amiodarone drip w/o bolus. Hold for SBP <95.  We will also attempt TEE guided DCCV on Monday (scheduled w/ Dr. Aundra Dubin at 1:15), although given severely dilated LA and untreated OSA, he may not hold NSR. He may do  better however with amiodarone on board. We will check TSH and will get updated echo.   2. Chronic Diastolic HF: pt was admitted in October for acute CHF exacerbation. He is now in atrial fibrillation w/ RVR and 1-2 + bilateral LEE on exam. His weight is up compared to recent discharge weigh in October. His weight was 108 kg (238 lb). Weight today is 253 lb and he has mild LEE on exam, BNP mildly elevated in the 300s. His lungs are CTAB. Given his soft BP today, we will hold off on Lasix. If his BP is better tomorrow, consider giving a dose of IV lasix.  He needs fluid restriction and low sodium diet. Continue daily weights, strict I/Os, close monitoring of renal function and electrolytes. Update echo.   3. Mitral Regurgitation: moderate by echo  04/2018.  Update echo.   4. ? OSA: reported in [place] but pt unaware and states he is not on CPAP at home. Recent echo showed severe Pulmonary HTN and severe dilated LA. Also new onset atrial fibrillation. He needs outpatient sleep study to confirm diagnosis and started on CPAP if study is c/w OSA.   5. H/O PUD: pt reports undergoing surgery for gastric ulcer in [place] over 50 years ago. He had recurrence after surgery but no issues in over the last 20 years. Given new afib and  CHA2DS2-VASc Score of 7, his unadjusted Ischemic Stroke Rate (% per year) is equal to 11.2 % stroke rate/year from a score of 7. He has been started on Eliquis. Recommend addition of a PPI for GI protection. He denies ETOH use. Avoid NSAID use.   6. Hypertension: h/o HTN but pressures have been low this admit. Monitor.   7. T2DM: management per IM.   8. H/o Prostate CA:  9. Pulmonary HTN: PA pressure was 75 mm Hg on recent echo in October. ? OSA. He needs sleep study. Continue management of diastolic HF. Update echo.     For questions or updates, please contact [place] Please consult [email or web address] for contact info under     Signed, Lyda Jester, PA-C  06/16/2018  3:18 PM

## 2018-06-17 ENCOUNTER — Inpatient Hospital Stay (HOSPITAL_COMMUNITY): Payer: Medicare Other

## 2018-06-17 DIAGNOSIS — I361 Nonrheumatic tricuspid (valve) insufficiency: Secondary | ICD-10-CM

## 2018-06-17 LAB — CBC
HCT: 40.8 % (ref 39.0–52.0)
Hemoglobin: 12.6 g/dL — ABNORMAL LOW (ref 13.0–17.0)
MCH: 28 pg (ref 26.0–34.0)
MCHC: 30.9 g/dL (ref 30.0–36.0)
MCV: 90.7 fL (ref 80.0–100.0)
Platelets: 163 10*3/uL (ref 150–400)
RBC: 4.5 MIL/uL (ref 4.22–5.81)
RDW: 14.4 % (ref 11.5–15.5)
WBC: 7.2 10*3/uL (ref 4.0–10.5)
nRBC: 0 % (ref 0.0–0.2)

## 2018-06-17 LAB — GLUCOSE, CAPILLARY
Glucose-Capillary: 171 mg/dL — ABNORMAL HIGH (ref 70–99)
Glucose-Capillary: 150 mg/dL — ABNORMAL HIGH (ref 70–99)
Glucose-Capillary: 124 mg/dL — ABNORMAL HIGH (ref 70–99)
Glucose-Capillary: 154 mg/dL — ABNORMAL HIGH (ref 70–99)

## 2018-06-17 LAB — ECHOCARDIOGRAM COMPLETE
Height: 71 in
Weight: 4109.37 oz

## 2018-06-17 MED ORDER — ALPRAZOLAM 0.25 MG PO TABS
0.2500 mg | ORAL_TABLET | Freq: Once | ORAL | Status: AC
  Administered 2018-06-17: 0.25 mg via ORAL
  Filled 2018-06-17: qty 1

## 2018-06-17 MED ORDER — SODIUM CHLORIDE 0.9 % IV SOLN: INTRAVENOUS | Status: DC

## 2018-06-17 NOTE — Progress Notes (Signed)
Patient ID: [name], male   DOB: 1934/09/30, 83 y.o.   MRN: [id number]   PROGRESS NOTE    [name]  ION:[id number] DOB: 1934-07-09 DOA: 06/13/2018 PCP: [name], MD    Brief Narrative:   83 year old male who presented with new onset atrial fibrillation. He does have significant past medical history for prostate cancer, diastolic heart failure, hypertension and pulmonary artery hypertension. Patient reported progressive dyspnea exertion for the last 6 months, he was seen by his primary care physician who found him in atrial fibrillation with rapid ventricular response, was referred for admission for evaluation.  Patient has had cardiac consultation.  Plan for possible ablation on Monday   Assessment & Plan:   Principal Problem:   Atrial fibrillation with RVR ([place]) Active Problems:   Diabetes mellitus without complication (HCC)   PAH (pulmonary artery hypertension) (HCC)   HTN (hypertension)   Atrial fibrillation ([place])   #1 atrial fibrillation with rapid ventricular response: Patient is being followed by cardiology and will follow recommendations.  At this point he appears to be rate controlled on amiodarone.  Cardiology has discontinued Cardizem as that has not controlled his rhythm.  Ablation of his A. fib is scheduled for Monday.  He has enlarged right atrium.  We will continue with amiodarone and anticoagulation until Monday.  Already on Eliquis.  #2 diabetes: Blood sugar is controlled at the moment.  On sliding scale insulin.  Continue monitoring.  #3 hypertension: Continue metoprolol.  Blood pressure appears controlled now.  #5 pulmonary hypertension: Severe based on echo.  Continue per cardiology.  #6 diastolic dysfunction CHF: Compensated at this point.     DVT prophylaxis: Eliquis Code Status: Full code Family Communication: No family at bedside Disposition Plan: Home most likely  Consultants:   Dr. Acie Fredrickson cardiology  Procedures:   None  today  Antimicrobials:   None   Subjective: Patient is sitting in the chair.  He denied any new chest pain.  No fever or chills no nausea vomiting or diarrhea.  Patient is worried about being in the hospital too long.  Objective: Vitals:   06/16/18 2057 06/17/18 0513 06/17/18 0513 06/17/18 0735  BP: 99/76  98/84   Pulse: 81  (!) 112   Resp:    17  Temp: 98.4 F (36.9 C)  98.5 F (36.9 C)   TempSrc: Oral  Oral   SpO2: 92%  91%   Weight:  116.5 kg    Height:        Intake/Output Summary (Last 24 hours) at 06/17/2018 0923 Last data filed at 06/17/2018 0653 Gross per 24 hour  Intake 991.83 ml  Output 675 ml  Net 316.83 ml   Filed Weights   06/15/18 0703 06/16/18 0546 06/17/18 0513  Weight: 114.8 kg 115.1 kg 116.5 kg    Examination:  General exam: Appears calm and comfortable  Respiratory system: Clear to auscultation. Respiratory effort normal. Cardiovascular system: Irregularly irregular rhythm with some mild systolic ejection murmur. Gastrointestinal system: Abdomen is nondistended, soft and nontender. No organomegaly or masses felt. Normal bowel sounds heard. Central nervous system: Alert and oriented. No focal neurological deficits. Extremities: Symmetric 5 x 5 power. Skin: No rashes, lesions or ulcers Psychiatry: Judgement and insight appear normal. Mood & affect appropriate.     Data Reviewed: I have personally reviewed following labs and imaging studies  CBC: Recent Labs  Lab 06/13/18 1515 06/14/18 0227 06/15/18 0330 06/16/18 0558 06/17/18 0446  WBC 6.4 6.1 6.1 6.4 7.2  HGB 12.7*  12.2* 11.9* 12.6* 12.6*  HCT 40.4 38.7* 40.0 42.2 40.8  MCV 90.8 90.8 91.7 92.1 90.7  PLT 166 150 167 171 381   Basic Metabolic Panel: Recent Labs  Lab 06/13/18 1515 06/15/18 0330  NA 139 141  K 4.0 4.6  CL 106 107  CO2 23 25  GLUCOSE 180* 154*  BUN 16 21  CREATININE 1.01 1.18  CALCIUM 8.5* 8.4*   GFR: Estimated Creatinine Clearance: 63.8 mL/min (by C-G  formula based on SCr of 1.18 mg/dL). Liver Function Tests: Recent Labs  Lab 06/13/18 1515  AST 50*  ALT 42  ALKPHOS 64  BILITOT 0.8  PROT 6.7  ALBUMIN 3.5   No results for input(s): LIPASE, AMYLASE in the last 168 hours. No results for input(s): AMMONIA in the last 168 hours. Coagulation Profile: No results for input(s): INR, PROTIME in the last 168 hours. Cardiac Enzymes: No results for input(s): CKTOTAL, CKMB, CKMBINDEX, TROPONINI in the last 168 hours. BNP (last 3 results) No results for input(s): PROBNP in the last 8760 hours. HbA1C: No results for input(s): HGBA1C in the last 72 hours. CBG: Recent Labs  Lab 06/16/18 0738 06/16/18 1049 06/16/18 1615 06/16/18 2057 06/17/18 0730  GLUCAP 129* 180* 120* 205* 171*   Lipid Profile: No results for input(s): CHOL, HDL, LDLCALC, TRIG, CHOLHDL, LDLDIRECT in the last 72 hours. Thyroid Function Tests: Recent Labs    06/16/18 2019  TSH 1.859   Anemia Panel: No results for input(s): VITAMINB12, FOLATE, FERRITIN, TIBC, IRON, RETICCTPCT in the last 72 hours. Sepsis Labs: Recent Labs  Lab 06/13/18 1529  LATICACIDVEN 1.68    No results found for this or any previous visit (from the past 240 hour(s)).       Radiology Studies: No results found.      Scheduled Meds: . albuterol  3 mL Inhalation See admin instructions  . alfuzosin  10 mg Oral Daily  . apixaban  5 mg Oral BID  . atorvastatin  20 mg Oral Daily  . gabapentin  100 mg Oral TID  . insulin aspart  0-15 Units Subcutaneous TID WC  . metoprolol tartrate  25 mg Oral BID  . [START ON 06/19/2018] sodium chloride flush  3 mL Intravenous Q12H   Continuous Infusions: . [START ON 06/19/2018] sodium chloride    . amiodarone 30 mg/hr (06/17/18 0518)  . sodium chloride       LOS: 3 days    Time spent: 32 minutes    Adrielle Polakowski,LAWAL, MD [facility] Pager [phone] If 7PM-7AM, please contact night-coverage [email or web address] Password  [facility] 06/17/2018, 9:23 AM

## 2018-06-17 NOTE — Progress Notes (Signed)
  Echocardiogram 2D Echocardiogram has been performed.  [name] [name] 06/17/2018, 11:55 AM

## 2018-06-18 LAB — CBC
HCT: 39.6 % (ref 39.0–52.0)
Hemoglobin: 12.6 g/dL — ABNORMAL LOW (ref 13.0–17.0)
MCH: 28.4 pg (ref 26.0–34.0)
MCHC: 31.8 g/dL (ref 30.0–36.0)
MCV: 89.2 fL (ref 80.0–100.0)
Platelets: 157 10*3/uL (ref 150–400)
RBC: 4.44 MIL/uL (ref 4.22–5.81)
RDW: 14.2 % (ref 11.5–15.5)
WBC: 7.6 10*3/uL (ref 4.0–10.5)
nRBC: 0 % (ref 0.0–0.2)

## 2018-06-18 LAB — GLUCOSE, CAPILLARY
Glucose-Capillary: 132 mg/dL — ABNORMAL HIGH (ref 70–99)
Glucose-Capillary: 173 mg/dL — ABNORMAL HIGH (ref 70–99)

## 2018-06-18 MED ORDER — ALPRAZOLAM 0.25 MG PO TABS
0.2500 mg | ORAL_TABLET | Freq: Once | ORAL | Status: AC
  Administered 2018-06-19: 0.25 mg via ORAL
  Filled 2018-06-18 (×2): qty 1

## 2018-06-18 NOTE — Progress Notes (Signed)
Progress Note  Patient Name: [name] Date of Encounter: 06/18/2018  Primary Cardiologist: Sherren Mocha, MD    Subjective   83 year old gentleman with a history of hypertension, chronic diastolic congestive heart failure, pulmonary hypertension, type II but diabetes mellitus.  He was admitted with rapid atrial fibrillation.  Scheduled for TEE/cardioversion tomorrow.  Inpatient Medications    Scheduled Meds: . albuterol  3 mL Inhalation See admin instructions  . alfuzosin  10 mg Oral Daily  . apixaban  5 mg Oral BID  . atorvastatin  20 mg Oral Daily  . gabapentin  100 mg Oral TID  . insulin aspart  0-15 Units Subcutaneous TID WC  . metoprolol tartrate  25 mg Oral BID  . [START ON 06/19/2018] sodium chloride flush  3 mL Intravenous Q12H   Continuous Infusions: . sodium chloride    . [START ON 06/19/2018] sodium chloride    . amiodarone 30 mg/hr (06/18/18 0700)  . sodium chloride     PRN Meds: acetaminophen, ondansetron (ZOFRAN) IV, polyvinyl alcohol, [START ON 06/19/2018] sodium chloride flush   Vital Signs    Vitals:   06/17/18 2048 06/17/18 2222 06/18/18 0644 06/18/18 0908  BP: 97/73  110/78 101/90  Pulse: (!) 104 (!) 109 (!) 108   Resp: 20  20   Temp: 97.9 F (36.6 C)  98.2 F (36.8 C)   TempSrc: Oral  Oral   SpO2: 90%  93%   Weight:   117.4 kg   Height:        Intake/Output Summary (Last 24 hours) at 06/18/2018 1028 Last data filed at 06/18/2018 0944 Gross per 24 hour  Intake 408.96 ml  Output 750 ml  Net -341.04 ml   Filed Weights   06/16/18 0546 06/17/18 0513 06/18/18 0644  Weight: 115.1 kg 116.5 kg 117.4 kg    Telemetry    AF , controlled ventricular response  - Personally Reviewed  ECG    Af  - Personally Reviewed  Physical Exam   GEN:  Elderly gentleman, no acute distress Neck: No JVD Cardiac:  Irregularly irregular  .  Respiratory: Clear to auscultation bilaterally. GI: Soft, nontender, non-distended  MS: No edema; No  deformity. Neuro:  Nonfocal  Psych: Normal affect   Labs    Chemistry Recent Labs  Lab 06/13/18 1515 06/15/18 0330  NA 139 141  K 4.0 4.6  CL 106 107  CO2 23 25  GLUCOSE 180* 154*  BUN 16 21  CREATININE 1.01 1.18  CALCIUM 8.5* 8.4*  PROT 6.7  --   ALBUMIN 3.5  --   AST 50*  --   ALT 42  --   ALKPHOS 64  --   BILITOT 0.8  --   GFRNONAA >60 58*  GFRAA >60 >60  ANIONGAP 10 9     Hematology Recent Labs  Lab 06/16/18 0558 06/17/18 0446 06/18/18 0342  WBC 6.4 7.2 7.6  RBC 4.58 4.50 4.44  HGB 12.6* 12.6* 12.6*  HCT 42.2 40.8 39.6  MCV 92.1 90.7 89.2  MCH 27.5 28.0 28.4  MCHC 29.9* 30.9 31.8  RDW 14.5 14.4 14.2  PLT 171 163 157    Cardiac EnzymesNo results for input(s): TROPONINI in the last 168 hours.  Recent Labs  Lab 06/13/18 1527  TROPIPOC 0.05     BNP Recent Labs  Lab 06/13/18 1515  BNP 336.7*     DDimer No results for input(s): DDIMER in the last 168 hours.   Radiology    No results  found.  Cardiac Studies    Patient Profile     83 y.o. male with a history of chronic diastolic congestive heart failure, pulmonary hypertension, hypertension.  He is admitted with rapid atrial fibrillation.  Assessment & Plan    1.  Atrial fibrillation: The patient was found to have atrial fibrillation with rapid ventricular response.  His rate is better on amiodarone drip and metoprolol 25 mg twice a day.  He continues on Eliquis.  2.  Hyperlipidemia: Continue atorvastatin 20 mg a day.        For questions or updates, please contact [place] Please consult [email or web address] for contact info under        Signed, Mertie Moores, MD  06/18/2018, 10:28 AM

## 2018-06-18 NOTE — Progress Notes (Signed)
Patient ID: [name], male   DOB: 1935/03/29, 83 y.o.   MRN: [id number]   PROGRESS NOTE    [name]  RCV:[id number] DOB: Nov 11, 1934 DOA: 06/13/2018 PCP: [name], MD    Brief Narrative:   83 year old male who presented with new onset atrial fibrillation. He does have significant past medical history for prostate cancer, diastolic heart failure, hypertension and pulmonary artery hypertension. Patient reported progressive dyspnea exertion for the last 6 months, he was seen by his primary care physician who found him in atrial fibrillation with rapid ventricular response, was referred for admission for evaluation.  Patient has had cardiac consultation.  Plan for possible ablation on Monday   Assessment & Plan:   Principal Problem:   Atrial fibrillation with RVR ([place]) Active Problems:   Diabetes mellitus without complication (HCC)   PAH (pulmonary artery hypertension) (HCC)   HTN (hypertension)   Atrial fibrillation ([place])   #1 atrial fibrillation with rapid ventricular response: Patient has no new issues today.  Is sitting in the chair awaiting procedures tomorrow and worried.  Patient is being followed by cardiology and will follow recommendations.  At this point he appears to be rate controlled on amiodarone.  Cardiology has discontinued Cardizem as that has not controlled his rhythm.  Ablation of his A. fib is scheduled for Monday.  He has enlarged right atrium.  We will continue with amiodarone and anticoagulation until Monday.  Already on Eliquis.  #2 diabetes: Blood sugar is controlled at the moment.  On sliding scale insulin.  Continue monitoring.  #3 hypertension: Continue metoprolol.  Blood pressure appears controlled now.  #5 pulmonary hypertension: Severe based on echo.  Continue per cardiology.  #6 diastolic dysfunction CHF: Compensated at this point.     DVT prophylaxis: Eliquis Code Status: Full code Family Communication: No family at bedside Disposition  Plan: Home most likely  Consultants:   Dr. Acie Fredrickson cardiology  Procedures:   None today  Antimicrobials:   None   Subjective: Patient is sitting in the chair.  Rate is still controlled on amiodarone.  No chest pain and no shortness of breath.  Objective: Vitals:   06/17/18 2048 06/17/18 2222 06/18/18 0644 06/18/18 0908  BP: 97/73  110/78 101/90  Pulse: (!) 104 (!) 109 (!) 108   Resp: 20  20   Temp: 97.9 F (36.6 C)  98.2 F (36.8 C)   TempSrc: Oral  Oral   SpO2: 90%  93%   Weight:   117.4 kg   Height:        Intake/Output Summary (Last 24 hours) at 06/18/2018 1251 Last data filed at 06/18/2018 0944 Gross per 24 hour  Intake 408.96 ml  Output 750 ml  Net -341.04 ml   Filed Weights   06/16/18 0546 06/17/18 0513 06/18/18 0644  Weight: 115.1 kg 116.5 kg 117.4 kg    Examination:  General exam: Appears calm and comfortable  Respiratory system: Clear to auscultation. Respiratory effort normal. Cardiovascular system: Irregularly irregular rhythm with some mild systolic ejection murmur. Gastrointestinal system: Abdomen is nondistended, soft and nontender. No organomegaly or masses felt. Normal bowel sounds heard. Central nervous system: Alert and oriented. No focal neurological deficits. Extremities: Symmetric 5 x 5 power. Skin: No rashes, lesions or ulcers Psychiatry: Judgement and insight appear normal. Mood & affect appropriate.     Data Reviewed: I have personally reviewed following labs and imaging studies  CBC: Recent Labs  Lab 06/14/18 0227 06/15/18 0330 06/16/18 0558 06/17/18 0446 06/18/18 0342  WBC  6.1 6.1 6.4 7.2 7.6  HGB 12.2* 11.9* 12.6* 12.6* 12.6*  HCT 38.7* 40.0 42.2 40.8 39.6  MCV 90.8 91.7 92.1 90.7 89.2  PLT 150 167 171 163 539   Basic Metabolic Panel: Recent Labs  Lab 06/13/18 1515 06/15/18 0330  NA 139 141  K 4.0 4.6  CL 106 107  CO2 23 25  GLUCOSE 180* 154*  BUN 16 21  CREATININE 1.01 1.18  CALCIUM 8.5* 8.4*    GFR: Estimated Creatinine Clearance: 64 mL/min (by C-G formula based on SCr of 1.18 mg/dL). Liver Function Tests: Recent Labs  Lab 06/13/18 1515  AST 50*  ALT 42  ALKPHOS 64  BILITOT 0.8  PROT 6.7  ALBUMIN 3.5   No results for input(s): LIPASE, AMYLASE in the last 168 hours. No results for input(s): AMMONIA in the last 168 hours. Coagulation Profile: No results for input(s): INR, PROTIME in the last 168 hours. Cardiac Enzymes: No results for input(s): CKTOTAL, CKMB, CKMBINDEX, TROPONINI in the last 168 hours. BNP (last 3 results) No results for input(s): PROBNP in the last 8760 hours. HbA1C: No results for input(s): HGBA1C in the last 72 hours. CBG: Recent Labs  Lab 06/17/18 1119 06/17/18 1659 06/17/18 2047 06/18/18 0743 06/18/18 1106  GLUCAP 150* 124* 154* 132* 173*   Lipid Profile: No results for input(s): CHOL, HDL, LDLCALC, TRIG, CHOLHDL, LDLDIRECT in the last 72 hours. Thyroid Function Tests: Recent Labs    06/16/18 2019  TSH 1.859   Anemia Panel: No results for input(s): VITAMINB12, FOLATE, FERRITIN, TIBC, IRON, RETICCTPCT in the last 72 hours. Sepsis Labs: Recent Labs  Lab 06/13/18 1529  LATICACIDVEN 1.68    No results found for this or any previous visit (from the past 240 hour(s)).       Radiology Studies: No results found.      Scheduled Meds: . albuterol  3 mL Inhalation See admin instructions  . alfuzosin  10 mg Oral Daily  . apixaban  5 mg Oral BID  . atorvastatin  20 mg Oral Daily  . gabapentin  100 mg Oral TID  . insulin aspart  0-15 Units Subcutaneous TID WC  . metoprolol tartrate  25 mg Oral BID  . [START ON 06/19/2018] sodium chloride flush  3 mL Intravenous Q12H   Continuous Infusions: . sodium chloride    . [START ON 06/19/2018] sodium chloride    . amiodarone 30 mg/hr (06/18/18 0700)  . sodium chloride       LOS: 4 days    Time spent: 22 minutes    GARBA,LAWAL, MD [facility] Pager [phone]  [phone] If 7PM-7AM, please contact night-coverage [email or web address] Password [email or web address] 06/18/2018, 12:51 PM

## 2018-06-18 NOTE — H&P (View-Only) (Signed)
Progress Note  Patient Name: [name] Date of Encounter: 06/18/2018  Primary Cardiologist: Sherren Mocha, MD    Subjective   83 year old gentleman with a history of hypertension, chronic diastolic congestive heart failure, pulmonary hypertension, type II but diabetes mellitus.  He was admitted with rapid atrial fibrillation.  Scheduled for TEE/cardioversion tomorrow.  Inpatient Medications    Scheduled Meds: . albuterol  3 mL Inhalation See admin instructions  . alfuzosin  10 mg Oral Daily  . apixaban  5 mg Oral BID  . atorvastatin  20 mg Oral Daily  . gabapentin  100 mg Oral TID  . insulin aspart  0-15 Units Subcutaneous TID WC  . metoprolol tartrate  25 mg Oral BID  . [START ON 06/19/2018] sodium chloride flush  3 mL Intravenous Q12H   Continuous Infusions: . sodium chloride    . [START ON 06/19/2018] sodium chloride    . amiodarone 30 mg/hr (06/18/18 0700)  . sodium chloride     PRN Meds: acetaminophen, ondansetron (ZOFRAN) IV, polyvinyl alcohol, [START ON 06/19/2018] sodium chloride flush   Vital Signs    Vitals:   06/17/18 2048 06/17/18 2222 06/18/18 0644 06/18/18 0908  BP: 97/73  110/78 101/90  Pulse: (!) 104 (!) 109 (!) 108   Resp: 20  20   Temp: 97.9 F (36.6 C)  98.2 F (36.8 C)   TempSrc: Oral  Oral   SpO2: 90%  93%   Weight:   117.4 kg   Height:        Intake/Output Summary (Last 24 hours) at 06/18/2018 1028 Last data filed at 06/18/2018 0944 Gross per 24 hour  Intake 408.96 ml  Output 750 ml  Net -341.04 ml   Filed Weights   06/16/18 0546 06/17/18 0513 06/18/18 0644  Weight: 115.1 kg 116.5 kg 117.4 kg    Telemetry    AF , controlled ventricular response  - Personally Reviewed  ECG    Af  - Personally Reviewed  Physical Exam   GEN:  Elderly gentleman, no acute distress Neck: No JVD Cardiac:  Irregularly irregular  .  Respiratory: Clear to auscultation bilaterally. GI: Soft, nontender, non-distended  MS: No edema; No  deformity. Neuro:  Nonfocal  Psych: Normal affect   Labs    Chemistry Recent Labs  Lab 06/13/18 1515 06/15/18 0330  NA 139 141  K 4.0 4.6  CL 106 107  CO2 23 25  GLUCOSE 180* 154*  BUN 16 21  CREATININE 1.01 1.18  CALCIUM 8.5* 8.4*  PROT 6.7  --   ALBUMIN 3.5  --   AST 50*  --   ALT 42  --   ALKPHOS 64  --   BILITOT 0.8  --   GFRNONAA >60 58*  GFRAA >60 >60  ANIONGAP 10 9     Hematology Recent Labs  Lab 06/16/18 0558 06/17/18 0446 06/18/18 0342  WBC 6.4 7.2 7.6  RBC 4.58 4.50 4.44  HGB 12.6* 12.6* 12.6*  HCT 42.2 40.8 39.6  MCV 92.1 90.7 89.2  MCH 27.5 28.0 28.4  MCHC 29.9* 30.9 31.8  RDW 14.5 14.4 14.2  PLT 171 163 157    Cardiac EnzymesNo results for input(s): TROPONINI in the last 168 hours.  Recent Labs  Lab 06/13/18 1527  TROPIPOC 0.05     BNP Recent Labs  Lab 06/13/18 1515  BNP 336.7*     DDimer No results for input(s): DDIMER in the last 168 hours.   Radiology    No results  found.  Cardiac Studies    Patient Profile     83 y.o. male with a history of chronic diastolic congestive heart failure, pulmonary hypertension, hypertension.  He is admitted with rapid atrial fibrillation.  Assessment & Plan    1.  Atrial fibrillation: The patient was found to have atrial fibrillation with rapid ventricular response.  His rate is better on amiodarone drip and metoprolol 25 mg twice a day.  He continues on Eliquis.  2.  Hyperlipidemia: Continue atorvastatin 20 mg a day.        For questions or updates, please contact [place] Please consult [email or web address] for contact info under        Signed, Mertie Moores, MD  06/18/2018, 10:28 AM

## 2018-06-19 ENCOUNTER — Encounter (HOSPITAL_COMMUNITY): Payer: Self-pay | Admitting: *Deleted

## 2018-06-19 ENCOUNTER — Encounter (HOSPITAL_COMMUNITY)
Admission: EM | Disposition: A | Discharge status: Home / Self Care | Payer: Self-pay | Source: Home / Self Care | Attending: Internal Medicine

## 2018-06-19 ENCOUNTER — Inpatient Hospital Stay (HOSPITAL_COMMUNITY): Payer: Medicare Other

## 2018-06-19 DIAGNOSIS — I4891 Unspecified atrial fibrillation: Secondary | ICD-10-CM

## 2018-06-19 DIAGNOSIS — I351 Nonrheumatic aortic (valve) insufficiency: Secondary | ICD-10-CM

## 2018-06-19 DIAGNOSIS — I340 Nonrheumatic mitral (valve) insufficiency: Secondary | ICD-10-CM

## 2018-06-19 HISTORY — PX: TEE WITHOUT CARDIOVERSION: SHX5443

## 2018-06-19 HISTORY — PX: CARDIOVERSION: SHX1299

## 2018-06-19 LAB — CBC
HCT: 39.2 % (ref 39.0–52.0)
Hemoglobin: 12.3 g/dL — ABNORMAL LOW (ref 13.0–17.0)
MCH: 28 pg (ref 26.0–34.0)
MCHC: 31.4 g/dL (ref 30.0–36.0)
MCV: 89.1 fL (ref 80.0–100.0)
Platelets: 145 10*3/uL — ABNORMAL LOW (ref 150–400)
RBC: 4.4 MIL/uL (ref 4.22–5.81)
RDW: 14.2 % (ref 11.5–15.5)
WBC: 7.3 10*3/uL (ref 4.0–10.5)
nRBC: 0 % (ref 0.0–0.2)

## 2018-06-19 LAB — BASIC METABOLIC PANEL
Anion gap: 12 (ref 5–15)
BUN: 41 mg/dL — ABNORMAL HIGH (ref 8–23)
CO2: 24 mmol/L (ref 22–32)
Calcium: 8.6 mg/dL — ABNORMAL LOW (ref 8.9–10.3)
Chloride: 99 mmol/L (ref 98–111)
Creatinine, Ser: 1.44 mg/dL — ABNORMAL HIGH (ref 0.61–1.24)
GFR calc Af Amer: 52 mL/min — ABNORMAL LOW (ref >60)
GFR calc non Af Amer: 45 mL/min — ABNORMAL LOW (ref >60)
Glucose, Bld: 142 mg/dL — ABNORMAL HIGH (ref 70–99)
Potassium: 4.6 mmol/L (ref 3.5–5.1)
Sodium: 135 mmol/L (ref 135–145)

## 2018-06-19 LAB — GLUCOSE, CAPILLARY
Glucose-Capillary: 131 mg/dL — ABNORMAL HIGH (ref 70–99)
Glucose-Capillary: 159 mg/dL — ABNORMAL HIGH (ref 70–99)
Glucose-Capillary: 128 mg/dL — ABNORMAL HIGH (ref 70–99)
Glucose-Capillary: 141 mg/dL — ABNORMAL HIGH (ref 70–99)
Glucose-Capillary: 158 mg/dL — ABNORMAL HIGH (ref 70–99)
Glucose-Capillary: 113 mg/dL — ABNORMAL HIGH (ref 70–99)

## 2018-06-19 SURGERY — ECHOCARDIOGRAM, TRANSESOPHAGEAL
Anesthesia: General

## 2018-06-19 MED ORDER — PHENYLEPHRINE HCL 10 MG/ML IJ SOLN
INTRAMUSCULAR | Status: DC | PRN
  Administered 2018-06-19: 120 ug via INTRAVENOUS
  Administered 2018-06-19 (×2): 160 ug via INTRAVENOUS
  Administered 2018-06-19: 120 ug via INTRAVENOUS
  Administered 2018-06-19: 80 ug via INTRAVENOUS
  Administered 2018-06-19: 160 ug via INTRAVENOUS

## 2018-06-19 MED ORDER — PROPOFOL 10 MG/ML IV BOLUS
INTRAVENOUS | Status: DC | PRN
  Administered 2018-06-19 (×4): 30 mg via INTRAVENOUS

## 2018-06-19 MED ORDER — EPHEDRINE SULFATE 50 MG/ML IJ SOLN
INTRAMUSCULAR | Status: DC | PRN
  Administered 2018-06-19: 10 mg via INTRAVENOUS

## 2018-06-19 MED ORDER — SODIUM CHLORIDE 0.9 % IV SOLN
INTRAVENOUS | Status: DC | PRN
  Administered 2018-06-19: 09:00:00 via INTRAVENOUS

## 2018-06-19 MED ORDER — LACTATED RINGERS IV SOLN
INTRAVENOUS | Status: AC
  Administered 2018-06-19: 12:00:00 via INTRAVENOUS

## 2018-06-19 MED ORDER — FUROSEMIDE 40 MG PO TABS
40.0000 mg | ORAL_TABLET | Freq: Every day | ORAL | Status: DC
  Administered 2018-06-19: 40 mg via ORAL
  Filled 2018-06-19 (×2): qty 1

## 2018-06-19 MED ORDER — METOPROLOL SUCCINATE ER 50 MG PO TB24
50.0000 mg | ORAL_TABLET | Freq: Every day | ORAL | Status: DC
  Administered 2018-06-19 – 2018-06-22 (×4): 50 mg via ORAL
  Filled 2018-06-19 (×4): qty 1

## 2018-06-19 MED ORDER — AMIODARONE HCL 200 MG PO TABS
200.0000 mg | ORAL_TABLET | Freq: Two times a day (BID) | ORAL | Status: DC
  Administered 2018-06-19 – 2018-06-22 (×6): 200 mg via ORAL
  Filled 2018-06-19 (×6): qty 1

## 2018-06-19 NOTE — Procedures (Signed)
Electrical Cardioversion Procedure Note [name] [id number] [birth year]  Procedure: Electrical Cardioversion Indications:  Atrial Fibrillation  Procedure Details Consent: Risks of procedure as well as the alternatives and risks of each were explained to the (patient/caregiver).  Consent for procedure obtained. Time Out: Verified patient identification, verified procedure, site/side was marked, verified correct patient position, special equipment/implants available, medications/allergies/relevent history reviewed, required imaging and test results available.  Performed  Patient placed on cardiac monitor, pulse oximetry, supplemental oxygen as necessary.  Sedation given: propofol per anesthesiology Pacer pads placed anterior and posterior chest.  Cardioverted 1 time(s).  Cardioverted at [place].  Evaluation Findings: Post procedure EKG shows: NSR Complications: None Patient did tolerate procedure well.   Loralie Champagne 06/19/2018, 9:39 AM

## 2018-06-19 NOTE — CV Procedure (Signed)
Procedure: TEE  Indication: Atrial fibrillation  Sedation: Per anesthesiology  Findings: Please see echo section for full report.  Severe LV dilation with normal wall thickness.  EF 25% with diffuse hypokinesis.  Mildly dilated RV with moderate systolic dysfunction.  Moderate left atrial enlargement, no LA appendage thrombus.  Mild right atrial enlargement.  Small PFO noted by color doppler.  Mild tricuspid regurgitation, peak RV-RA gradient 24 mmHg.  Trileaflet aortic valve with mild aortic insufficiency, no aortic stenosis.  There was severe mitral regurgitation with anteriorly-directed jet.  There appeared to be a ruptured chord and the anterior and posterior leaflets did not coapt completely with restriction of posterior leaflet.  PISA ERO 0.78 cm^2.  There was systolic flow reversal in the pulmonary vein doppler pattern.  Normal caliber aorta mild plaque.    Impression:  1. No LA thrombus, may proceed to DCCV.  2. Severe LV dilation with EF 25%, diffuse hypokinesis.  3. Mild RV dilation with moderate systolic dysfunction.  4. Severe MR with restriction of posterior leaflet and evidence for a ruptured chord.

## 2018-06-19 NOTE — Interval H&P Note (Signed)
History and Physical Interval Note:  06/19/2018 9:14 AM  [name]  has presented today for surgery, with the diagnosis of AFIB  The various methods of treatment have been discussed with the patient and family. After consideration of risks, benefits and other options for treatment, the patient has consented to  Procedure(s): TRANSESOPHAGEAL ECHOCARDIOGRAM (TEE) (N/A) CARDIOVERSION (N/A) as a surgical intervention .  The patient's history has been reviewed, patient examined, no change in status, stable for surgery.  I have reviewed the patient's chart and labs.  Questions were answered to the patient's satisfaction.     [name] [employer]

## 2018-06-19 NOTE — Progress Notes (Addendum)
Progress Note  Patient Name: [name] Date of Encounter: 06/19/2018  Primary Cardiologist: Sherren Mocha, MD   Subjective   Denies any CP or SOB. Feeling tired.   Inpatient Medications    Scheduled Meds: . albuterol  3 mL Inhalation See admin instructions  . alfuzosin  10 mg Oral Daily  . apixaban  5 mg Oral BID  . atorvastatin  20 mg Oral Daily  . gabapentin  100 mg Oral TID  . insulin aspart  0-15 Units Subcutaneous TID WC  . metoprolol tartrate  25 mg Oral BID  . sodium chloride flush  3 mL Intravenous Q12H   Continuous Infusions: . sodium chloride    . sodium chloride 250 mL (06/19/18 0026)  . amiodarone 30 mg/hr (06/19/18 0108)  . sodium chloride     PRN Meds: acetaminophen, ondansetron (ZOFRAN) IV, polyvinyl alcohol, sodium chloride flush   Vital Signs    Vitals:   06/18/18 0908 06/18/18 1431 06/18/18 2119 06/19/18 0550  BP: 101/90 90/73 110/65 90/67  Pulse:  94 96 86  Resp:   20 20  Temp:  97.6 F (36.4 C) 98.1 F (36.7 C) 98.1 F (36.7 C)  TempSrc:  Oral Oral Oral  SpO2:  98% 95% 96%  Weight:    117.8 kg  Height:        Intake/Output Summary (Last 24 hours) at 06/19/2018 0751 Last data filed at 06/18/2018 1901 Gross per 24 hour  Intake 600 ml  Output 125 ml  Net 475 ml   Filed Weights   06/17/18 0513 06/18/18 0644 06/19/18 0550  Weight: 116.5 kg 117.4 kg 117.8 kg    Telemetry    Atrial fibrillation, HR 90s - Personally Reviewed  ECG    Atrial fibrillation with RBBB - Personally Reviewed  Physical Exam   GEN: No acute distress.   Neck: No JVD Cardiac: irregularly irregular, no murmurs, rubs, or gallops.  Respiratory: Clear to auscultation bilaterally. GI: Soft, nontender, non-distended  MS: No deformity. 2+ pitting edema Neuro:  Nonfocal  Psych: Normal affect   Labs    Chemistry Recent Labs  Lab 06/13/18 1515 06/15/18 0330 06/19/18 0214  NA 139 141 135  K 4.0 4.6 4.6  CL 106 107 99  CO2 23 25 24   GLUCOSE 180*  154* 142*  BUN 16 21 41*  CREATININE 1.01 1.18 1.44*  CALCIUM 8.5* 8.4* 8.6*  PROT 6.7  --   --   ALBUMIN 3.5  --   --   AST 50*  --   --   ALT 42  --   --   ALKPHOS 64  --   --   BILITOT 0.8  --   --   GFRNONAA >60 58* 45*  GFRAA >60 >60 52*  ANIONGAP 10 9 12      Hematology Recent Labs  Lab 06/17/18 0446 06/18/18 0342 06/19/18 0214  WBC 7.2 7.6 7.3  RBC 4.50 4.44 4.40  HGB 12.6* 12.6* 12.3*  HCT 40.8 39.6 39.2  MCV 90.7 89.2 89.1  MCH 28.0 28.4 28.0  MCHC 30.9 31.8 31.4  RDW 14.4 14.2 14.2  PLT 163 157 145*    Cardiac EnzymesNo results for input(s): TROPONINI in the last 168 hours.  Recent Labs  Lab 06/13/18 1527  TROPIPOC 0.05     BNP Recent Labs  Lab 06/13/18 1515  BNP 336.7*     DDimer No results for input(s): DDIMER in the last 168 hours.   Radiology    No  results found.  Cardiac Studies   Echo 04/29/2018 LV EF: 55% -   60% Study Conclusions  - Left ventricle: The cavity size was normal. Wall thickness was   increased in a pattern of moderate LVH. Systolic function was   normal. The estimated ejection fraction was in the range of 55%   to 60%. Features are consistent with a pseudonormal left   ventricular filling pattern, with concomitant abnormal relaxation   and increased filling pressure (grade 2 diastolic dysfunction). - Aortic valve: There was mild regurgitation. - Mitral valve: There was moderate regurgitation. - Left atrium: The atrium was severely dilated. - Right atrium: The atrium was mildly dilated. - Pulmonary arteries: Systolic pressure was severely increased. PA   peak pressure: 75 mm Hg (S).     Echo 06/17/2018 LV EF: 20% Study Conclusions  - Left ventricle: The cavity size was severely dilated. There was   mild focal basal hypertrophy of the septum. The estimated   ejection fraction was 20%. Diffuse hypokinesis with regional   variation. There is severe hypokinesis of the basal-midinferior   myocardium.  Indeterminate diastolic function. - Aortic valve: Mildly calcified annulus. Trileaflet; mildly   calcified leaflets. There was mild regurgitation. Valve area   (Vmax): 1.88 cm^2. - Mitral valve: Mildly calcified annulus. Mildly thickened   leaflets. There was severe regurgitation. Regurgitant volume   (PISA): 104 ml. - Left atrium: The atrium was mildly dilated. - Right ventricle: Systolic function was moderately to severely   reduced. - Right atrium: The atrium was mildly dilated. Central venous   pressure (est): 15 mm Hg. - Tricuspid valve: There was moderate regurgitation. - Pulmonary arteries: Systolic pressure was moderately to severely   increased. PA peak pressure: 58 mm Hg (S). - Pericardium, extracardiac: There was no pericardial effusion.  Patient Profile     83 y.o. male with a history of hypertension, chronic diastolic congestive heart failure, pulmonary hypertension, type II diabetes mellitus and neurocardiogenic syncope in 2016. He was admitted with rapid atrial fibrillation.  Assessment & Plan    1. Atrial fibrillation with RVR  - started on eliquis  - pending TEE DCCV today at 1PM by Dr. Aundra Dubin  2. 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   - Echo 04/29/2018 EF 55-60%, grade 2 DD, moderate MR, PA peak pressure 45mmhg  - Echo 06/17/2018 EF 20%, severe MR, mild AI, mild LAE, severely reduced RVEF, PA peak pressure 68mmhg  - volume overloaded on exam with 1-2+ pitting edema in bilateral LE, plan to consolidate metoprolol tartrate to succinate post cath. Likely start on soft diuresis post cath as well with plan to add low dose ACEI or ARB once BP improve. Anticipate D/C in 48 hours  3. Severe MR: was moderate MR on echo 2 month ago, question if severe MR was due to volume overload. Consider repeat limited echo once patient is fully diuresed as outpatient.   4. HLD  5. Pulmonary hypertension: unclear  if due to volume overload, the previous echo was also obtained during CHF exacerbation. Maybe beneficial to repeat limited echo once fully diuresed to look at the MR and PA peak pressure  6. DM II      For questions or updates, please contact [place] Please consult [email or web address] for contact info under        Signed, Almyra Deforest, [place]  06/19/2018, 7:51 AM

## 2018-06-19 NOTE — Anesthesia Postprocedure Evaluation (Signed)
Anesthesia Post Note  Patient: [name]  Procedure(s) Performed: TRANSESOPHAGEAL ECHOCARDIOGRAM (TEE) (N/A ) CARDIOVERSION (N/A )     Patient location during evaluation: Endoscopy Anesthesia Type: General Level of consciousness: awake Pain management: pain level controlled Vital Signs Assessment: post-procedure vital signs reviewed and stable Respiratory status: spontaneous breathing Cardiovascular status: stable Postop Assessment: no apparent nausea or vomiting Anesthetic complications: no    Last Vitals:  Vitals:   06/19/18 1005 06/19/18 1212  BP: (!) 83/58 94/78  Pulse: 66 68  Resp: 18   Temp:  (!) 36.4 C  SpO2: 94% 95%    Last Pain:  Vitals:   06/19/18 1212  TempSrc: Axillary  PainSc:    Pain Goal: Patients Stated Pain Goal: 0 (06/19/18 0826)               [name]

## 2018-06-19 NOTE — Transfer of Care (Signed)
Immediate Anesthesia Transfer of Care Note  Patient: [name]  Procedure(s) Performed: TRANSESOPHAGEAL ECHOCARDIOGRAM (TEE) (N/A ) CARDIOVERSION (N/A )  Patient Location: PACU  Anesthesia Type:General  Level of Consciousness: awake, alert  and oriented  Airway & Oxygen Therapy: Patient Spontanous Breathing and Patient connected to nasal cannula oxygen  Post-op Assessment: Report given to RN, Post -op Vital signs reviewed and stable and Patient moving all extremities X 4  Post vital signs: Reviewed and stable  Last Vitals:  Vitals Value Taken Time  BP 88/65 06/19/2018  9:44 AM  Temp    Pulse 70 06/19/2018  9:46 AM  Resp 20 06/19/2018  9:46 AM  SpO2 91 % 06/19/2018  9:46 AM  Vitals shown include unvalidated device data.  Last Pain:  Vitals:   06/19/18 0855  TempSrc: Oral  PainSc: 0-No pain      Patients Stated Pain Goal: 0 (65/99/35 7017)  Complications: No apparent anesthesia complications

## 2018-06-19 NOTE — Anesthesia Preprocedure Evaluation (Addendum)
Anesthesia Evaluation  Patient identified by MRN, date of birth, ID band Patient awake    Reviewed: Allergy & Precautions, NPO status , Patient's Chart, lab work & pertinent test results  Airway Mallampati: II       Dental  (+) Poor Dentition   Pulmonary sleep apnea and Continuous Positive Airway Pressure Ventilation , former smoker,    Pulmonary exam normal        Cardiovascular hypertension, Pt. on medications +CHF  + dysrhythmias Atrial Fibrillation  Rhythm:Irregular Rate:Normal     Neuro/Psych    GI/Hepatic negative GI ROS, Neg liver ROS,   Endo/Other  diabetes, Type 2, Oral Hypoglycemic Agents  Renal/GU Renal disease     Musculoskeletal   Abdominal (+) + obese,   Peds  Hematology   Anesthesia Other Findings   Reproductive/Obstetrics                            Anesthesia Physical Anesthesia Plan  ASA: III  Anesthesia Plan: General   Post-op Pain Management:    Induction: Intravenous  PONV Risk Score and Plan: 2  Airway Management Planned: Natural Airway and Mask  Additional Equipment:   Intra-op Plan:   Post-operative Plan:   Informed Consent: I have reviewed the patients History and Physical, chart, labs and discussed the procedure including the risks, benefits and alternatives for the proposed anesthesia with the patient or authorized representative who has indicated his/her understanding and acceptance.   Dental advisory given  Plan Discussed with: CRNA  Anesthesia Plan Comments:         Anesthesia Quick Evaluation

## 2018-06-19 NOTE — Progress Notes (Signed)
  Echocardiogram Echocardiogram Transesophageal has been performed.  Bobbye Charleston 06/19/2018, 9:49 AM

## 2018-06-19 NOTE — Progress Notes (Signed)
Patient ID: [name], male   DOB: 04-Apr-1935, 83 y.o.   MRN: [id number]   PROGRESS NOTE    Brent Taillon  LPF:[id number] DOB: 06/27/1935 DOA: 06/13/2018 PCP: [name], MD    Brief Narrative:   83 year old male who presented with new onset atrial fibrillation. He does have significant past medical history for prostate cancer, diastolic heart failure, hypertension and pulmonary artery hypertension. Patient reported progressive dyspnea exertion for the last 6 months, he was seen by his primary care physician who found him in atrial fibrillation with rapid ventricular response, was referred for admission for evaluation.  Patient has had cardiac consultation.  Plan for possible ablation on Monday    Subjective:  Patient in bed, appears comfortable, denies any headache, no fever, no chest pain or pressure, no shortness of breath , no abdominal pain. No focal weakness.    Assessment & Plan:    #1. Newly diagnosed Atrial fibrillation with rapid ventricular response: [place] vas 2 score of at least 3.  Seen by cardiology, currently on amiodarone drip, oral beta-blocker along with Eliquis, due for TEE with cardioversion later on 06/19/2018.  If stable likely discharge in the next 1 to 2 days.  Currently has developed ARF.  #2 DM type II: Blood sugar is controlled at the moment.  On sliding scale insulin.  Continue monitoring.  CBG (last 3)  Recent Labs    06/18/18 1620 06/18/18 2117 06/19/18 0733  GLUCAP 131* 159* 128*     #3 hypertension: Metoprolol stable and monitor.  #5 Pulmonary hypertension: Severe based on echo.  Continue per cardiology.  #6  Chronic systolic and diastolic dysfunction CHF 53%: Compensated at this point.  Continue beta-blocker, no ACE/ARB due to ARF.  Defer further work-up for wall motion abnormality to cardiology.  #7.  ARF.  Hydrate and monitor.  #8.  Dyslipidemia.  On statin.     DVT prophylaxis: Eliquis Code Status: Full code Family  Communication: No family at bedside Disposition Plan: Home most likely  Consultants:   Dr. Acie Fredrickson cardiology  Procedures:    TTE  - Left ventricle: The cavity size was severely dilated. There was mild focal basal hypertrophy of the septum. The estimated ejection fraction was 20%. Diffuse hypokinesis with regional variation. There is severe hypokinesis of the basal-midinferior myocardium. Indeterminate diastolic function. - Aortic valve: Mildly calcified annulus. Trileaflet; mildly calcified leaflets. There was mild regurgitation. Valve area (Vmax): 1.88 cm^2. - Mitral valve: Mildly calcified annulus. Mildly thickened leaflets. There was severe regurgitation. Regurgitant volume (PISA): 104 ml. - Left atrium: The atrium was mildly dilated. - Right ventricle: Systolic function was moderately to severely reduced. - Right atrium: The atrium was mildly dilated. Central venous   pressure (est): 15 mm Hg. - Tricuspid valve: There was moderate regurgitation. - Pulmonary arteries: Systolic pressure was moderately to severely increased. PA peak pressure: 58 mm Hg (S). - Pericardium, extracardiac: There was no pericardial effusion.    Cardioversion with TEE  Antimicrobials:   None  Objective: Vitals:   06/18/18 0908 06/18/18 1431 06/18/18 2119 06/19/18 0550  BP: 101/90 90/73 110/65 90/67  Pulse:  94 96 86  Resp:   20 20  Temp:  97.6 F (36.4 C) 98.1 F (36.7 C) 98.1 F (36.7 C)  TempSrc:  Oral Oral Oral  SpO2:  98% 95% 96%  Weight:    117.8 kg  Height:        Intake/Output Summary (Last 24 hours) at 06/19/2018 0854 Last data filed at 06/18/2018 1901  Gross per 24 hour  Intake 600 ml  Output 125 ml  Net 475 ml   Filed Weights   06/17/18 0513 06/18/18 0644 06/19/18 0550  Weight: 116.5 kg 117.4 kg 117.8 kg    Examination:  Awake Alert, Oriented X 3, No new F.N deficits, Normal affect [place].AT,PERRAL Supple Neck,No JVD, No cervical lymphadenopathy appriciated.  Symmetrical  Chest wall movement, Good air movement bilaterally, CTAB iRRR,No Gallops, Rubs or new Murmurs, No Parasternal Heave +ve B.Sounds, Abd Soft, No tenderness, No organomegaly appriciated, No rebound - guarding or rigidity. No Cyanosis, Clubbing or edema, No new Rash or bruise   Data Reviewed: I have personally reviewed following labs and imaging studies  CBC: Recent Labs  Lab 06/15/18 0330 06/16/18 0558 06/17/18 0446 06/18/18 0342 06/19/18 0214  WBC 6.1 6.4 7.2 7.6 7.3  HGB 11.9* 12.6* 12.6* 12.6* 12.3*  HCT 40.0 42.2 40.8 39.6 39.2  MCV 91.7 92.1 90.7 89.2 89.1  PLT 167 171 163 157 294*   Basic Metabolic Panel: Recent Labs  Lab 06/13/18 1515 06/15/18 0330 06/19/18 0214  NA 139 141 135  K 4.0 4.6 4.6  CL 106 107 99  CO2 23 25 24   GLUCOSE 180* 154* 142*  BUN 16 21 41*  CREATININE 1.01 1.18 1.44*  CALCIUM 8.5* 8.4* 8.6*   GFR: Estimated Creatinine Clearance: 52.5 mL/min (A) (by C-G formula based on SCr of 1.44 mg/dL (H)). Liver Function Tests: Recent Labs  Lab 06/13/18 1515  AST 50*  ALT 42  ALKPHOS 64  BILITOT 0.8  PROT 6.7  ALBUMIN 3.5   No results for input(s): LIPASE, AMYLASE in the last 168 hours. No results for input(s): AMMONIA in the last 168 hours. Coagulation Profile: No results for input(s): INR, PROTIME in the last 168 hours. Cardiac Enzymes: No results for input(s): CKTOTAL, CKMB, CKMBINDEX, TROPONINI in the last 168 hours. BNP (last 3 results) No results for input(s): PROBNP in the last 8760 hours. HbA1C: No results for input(s): HGBA1C in the last 72 hours. CBG: Recent Labs  Lab 06/18/18 0743 06/18/18 1106 06/18/18 1620 06/18/18 2117 06/19/18 0733  GLUCAP 132* 173* 131* 159* 128*   Lipid Profile: No results for input(s): CHOL, HDL, LDLCALC, TRIG, CHOLHDL, LDLDIRECT in the last 72 hours. Thyroid Function Tests: Recent Labs    06/16/18 2019  TSH 1.859   Anemia Panel: No results for input(s): VITAMINB12, FOLATE, FERRITIN, TIBC,  IRON, RETICCTPCT in the last 72 hours. Sepsis Labs: Recent Labs  Lab 06/13/18 1529  LATICACIDVEN 1.68    No results found for this or any previous visit (from the past 240 hour(s)).    Radiology Studies: No results found.   Scheduled Meds: . [MAR Hold] albuterol  3 mL Inhalation See admin instructions  . [MAR Hold] alfuzosin  10 mg Oral Daily  . [MAR Hold] apixaban  5 mg Oral BID  . [MAR Hold] atorvastatin  20 mg Oral Daily  . [MAR Hold] gabapentin  100 mg Oral TID  . [MAR Hold] insulin aspart  0-15 Units Subcutaneous TID WC  . [MAR Hold] metoprolol tartrate  25 mg Oral BID   Continuous Infusions: . amiodarone 30 mg/hr (06/19/18 0108)  . lactated ringers       LOS: 5 days   Time spent: 25 minutes  Signature  Lala Lund M.D on 06/19/2018 at 8:54 AM   -  To page go to [email or web address] - password [facility]

## 2018-06-19 NOTE — Progress Notes (Signed)
Physical Therapy Treatment Patient Details Name: [name] MRN: [id number] DOB: [birth year] Today's Date: 06/19/2018    History of Present Illness 83 year old male who presented with new onset A-fib. PXT:GGYIRSWN cancer, diastolic heart failure, HTN, DM, PAH.  Patient reported progressive dyspnea exertion for the last 6 months, he was seen by his primary care physician who found him in A-fib with RVR, referred for admission for evaluation.  s/p cardioversion 12/16.    PT Comments    Patient s/p cardioversion this AM and still feeling lethargic and nauseous from procedure. Tolerated gait training with supervision for safety. Requires close Min guard for stair training due to safety/lines. Pt has to negotiate 4 flights of stairs in house as he runs an air BnB. Will attempt more stairs once lines are d/ced. Encouraged ambulation with nursing. Will follow.   Follow Up Recommendations  No PT follow up     Equipment Recommendations  None recommended by PT    Recommendations for Other Services       Precautions / Restrictions Precautions Precautions: Fall Precaution Comments: watch HR Restrictions Weight Bearing Restrictions: No    Mobility  Bed Mobility               General bed mobility comments: Up in chair upon PT arrival.  Transfers Overall transfer level: Independent               General transfer comment: Stood from chair x1, no dizziness.  Ambulation/Gait Ambulation/Gait assistance: Supervision Gait Distance (Feet): 200 Feet Assistive device: None Gait Pattern/deviations: Step-through pattern;Decreased stride length     General Gait Details: Mildly unsteady gait- still slightly nauseous and woozy from procedure, 3/4 DOE Sp02 remained >90% on RA. HR stable in NSR.   Stairs Stairs: Yes Stairs assistance: Min guard Stair Management: Alternating pattern;One rail Left Number of Stairs: 3 General stair comments: Cues for safety. CLose min guard for  safety due to lines.    Wheelchair Mobility    Modified Rankin (Stroke Patients Only)       Balance Overall balance assessment: Needs assistance Sitting-balance support: No upper extremity supported;Feet supported Sitting balance-Leahy Scale: Good     Standing balance support: No upper extremity supported;During functional activity Standing balance-Leahy Scale: Good                              Cognition Arousal/Alertness: Awake/alert Behavior During Therapy: WFL for tasks assessed/performed Overall Cognitive Status: Within Functional Limits for tasks assessed                                        Exercises      General Comments General comments (skin integrity, edema, etc.): BP soft.  pre activity BP 98/75, post BP 113/80       Pertinent Vitals/Pain Pain Assessment: No/denies pain    Home Living                      Prior Function            PT Goals (current goals can now be found in the care plan section) Progress towards PT goals: Progressing toward goals    Frequency    Min 3X/week      PT Plan Current plan remains appropriate    Co-evaluation  AM-PAC PT "6 Clicks" Mobility   Outcome Measure  Help needed turning from your back to your side while in a flat bed without using bedrails?: None Help needed moving from lying on your back to sitting on the side of a flat bed without using bedrails?: None Help needed moving to and from a bed to a chair (including a wheelchair)?: None Help needed standing up from a chair using your arms (e.g., wheelchair or bedside chair)?: None Help needed to walk in hospital room?: A Little Help needed climbing 3-5 steps with a railing? : A Little 6 Click Score: 22    End of Session Equipment Utilized During Treatment: Gait belt Activity Tolerance: Patient limited by fatigue;Patient limited by lethargy Patient left: in chair;with call bell/phone within  reach Nurse Communication: Mobility status PT Visit Diagnosis: Muscle weakness (generalized) (M62.81)     Time: 2836-6294 PT Time Calculation (min) (ACUTE ONLY): 20 min  Charges:  $Gait Training: 8-22 mins                     Wray Kearns, PT, DPT Acute Rehabilitation Services Pager [phone] Office [phone]       Marguarite Arbour A Sabra Heck 06/19/2018, 3:31 PM

## 2018-06-20 ENCOUNTER — Encounter (HOSPITAL_COMMUNITY): Payer: Self-pay | Admitting: General Practice

## 2018-06-20 ENCOUNTER — Inpatient Hospital Stay (HOSPITAL_COMMUNITY): Payer: Medicare Other

## 2018-06-20 DIAGNOSIS — I5021 Acute systolic (congestive) heart failure: Secondary | ICD-10-CM

## 2018-06-20 LAB — BASIC METABOLIC PANEL
Anion gap: 12 (ref 5–15)
BUN: 37 mg/dL — ABNORMAL HIGH (ref 8–23)
CO2: 23 mmol/L (ref 22–32)
Calcium: 8.4 mg/dL — ABNORMAL LOW (ref 8.9–10.3)
Chloride: 100 mmol/L (ref 98–111)
Creatinine, Ser: 1.55 mg/dL — ABNORMAL HIGH (ref 0.61–1.24)
GFR calc Af Amer: 48 mL/min — ABNORMAL LOW (ref >60)
GFR calc non Af Amer: 41 mL/min — ABNORMAL LOW (ref >60)
Glucose, Bld: 118 mg/dL — ABNORMAL HIGH (ref 70–99)
Potassium: 4.4 mmol/L (ref 3.5–5.1)
SODIUM: 135 mmol/L (ref 135–145)

## 2018-06-20 LAB — CBC
HCT: 37.7 % — ABNORMAL LOW (ref 39.0–52.0)
Hemoglobin: 11.6 g/dL — ABNORMAL LOW (ref 13.0–17.0)
MCH: 27.4 pg (ref 26.0–34.0)
MCHC: 30.8 g/dL (ref 30.0–36.0)
MCV: 89.1 fL (ref 80.0–100.0)
Platelets: 161 10*3/uL (ref 150–400)
RBC: 4.23 MIL/uL (ref 4.22–5.81)
RDW: 14.2 % (ref 11.5–15.5)
WBC: 6.5 10*3/uL (ref 4.0–10.5)
nRBC: 0 % (ref 0.0–0.2)

## 2018-06-20 LAB — URINALYSIS, ROUTINE W REFLEX MICROSCOPIC
Bilirubin Urine: NEGATIVE
Glucose, UA: NEGATIVE mg/dL
Hgb urine dipstick: NEGATIVE
Ketones, ur: NEGATIVE mg/dL
Leukocytes, UA: NEGATIVE
Nitrite: NEGATIVE
Protein, ur: NEGATIVE mg/dL
Specific Gravity, Urine: 1.015 (ref 1.005–1.030)
pH: 5 (ref 5.0–8.0)

## 2018-06-20 LAB — OSMOLALITY: Osmolality: 297 mosm/kg — ABNORMAL HIGH (ref 275–295)

## 2018-06-20 LAB — GLUCOSE, CAPILLARY
GLUCOSE-CAPILLARY: 150 mg/dL — AB (ref 70–99)
Glucose-Capillary: 113 mg/dL — ABNORMAL HIGH (ref 70–99)
Glucose-Capillary: 127 mg/dL — ABNORMAL HIGH (ref 70–99)
Glucose-Capillary: 165 mg/dL — ABNORMAL HIGH (ref 70–99)

## 2018-06-20 LAB — OSMOLALITY, URINE: Osmolality, Ur: 516 mOsm/kg (ref 300–900)

## 2018-06-20 LAB — CREATININE, URINE, RANDOM: Creatinine, Urine: 110 mg/dL

## 2018-06-20 LAB — SODIUM, URINE, RANDOM: Sodium, Ur: <10 mmol/L

## 2018-06-20 MED ORDER — LACTATED RINGERS IV SOLN
INTRAVENOUS | Status: DC
  Administered 2018-06-20 – 2018-06-21 (×2): via INTRAVENOUS

## 2018-06-20 NOTE — Care Management Important Message (Signed)
Important Message  Patient Details  Name: [name] MRN: [id number] Date of Birth: 12-30-1934   Medicare Important Message Given:  Yes    Manami Tutor P Tyqwan Pink 06/20/2018, 2:05 PM

## 2018-06-20 NOTE — Progress Notes (Signed)
Patient ID: [name], male   DOB: 03/25/1935, 83 y.o.   MRN: [id number]   PROGRESS NOTE    [name]  DXA:[id number] DOB: [birth year] DOA: 06/13/2018 PCP: [name], MD    Brief Narrative:   83 year old male who presented with new onset atrial fibrillation. He does have significant past medical history for prostate cancer, diastolic heart failure, hypertension and pulmonary artery hypertension. Patient reported progressive dyspnea exertion for the last 6 months, he was seen by his primary care physician who found him in atrial fibrillation with rapid ventricular response, was referred for admission for evaluation.  Patient has had cardiac consultation.  Underwent cardioversion however now developing ARF.    Subjective:  Patient in bed, appears comfortable, denies any headache, no fever, no chest pain or pressure, no shortness of breath , no abdominal pain. No focal weakness.  Assessment & Plan:    #1. Newly diagnosed Atrial fibrillation with rapid ventricular response: [place] vas 2 score of at least 3.  Seen by cardiology, he underwent TEE with cardioversion and currently in sinus, currently placed on oral amiodarone, beta-blocker and Eliquis.  His TSH and transthoracic echocardiogram were stable, he was initially on amnio drip.  He is stable from cardiac standpoint except that he is developing worsening renal function.    #2 DM type II: Blood sugar is controlled at the moment.  On sliding scale insulin.  Continue monitoring.  CBG (last 3)  Recent Labs    06/19/18 1632 06/19/18 2111 06/20/18 0729  GLUCAP 158* 113* 113*     #3 Hypertension: Metoprolol stable and monitor.  #5 Pulmonary hypertension: Severe based on echo.  Continue per cardiology.  #6  Chronic systolic and diastolic dysfunction CHF 20%: Compensated at this point.  Continue beta-blocker, no ACE/ARB due to ARF.  Defer further work-up for wall motion abnormality to cardiology.  #7.  ARF.  Check UA with urine  electrolytes, renal ultrasound nonacute, hydrate and repeat BMP in the morning.  Hold Lasix and nephrotoxins.  #8.  Dyslipidemia.  On statin.     DVT prophylaxis: Eliquis Code Status: Full code Family Communication: No family at bedside Disposition Plan: Home most likely  Consultants:   Dr. Acie Fredrickson cardiology  Procedures:    Renal ultrasound.  06/20/2018.  Nonacute.    Cardioversion with TEE by cardiology on 06/19/2018.  TTE  - Left ventricle: The cavity size was severely dilated. There was mild focal basal hypertrophy of the septum. The estimated ejection fraction was 20%. Diffuse hypokinesis with regional variation. There is severe hypokinesis of the basal-midinferior myocardium. Indeterminate diastolic function. - Aortic valve: Mildly calcified annulus. Trileaflet; mildly calcified leaflets. There was mild regurgitation. Valve area (Vmax): 1.88 cm^2. - Mitral valve: Mildly calcified annulus. Mildly thickened leaflets. There was severe regurgitation. Regurgitant volume (PISA): 104 ml. - Left atrium: The atrium was mildly dilated. - Right ventricle: Systolic function was moderately to severely reduced. - Right atrium: The atrium was mildly dilated. Central venous   pressure (est): 15 mm Hg. - Tricuspid valve: There was moderate regurgitation. - Pulmonary arteries: Systolic pressure was moderately to severely increased. PA peak pressure: 58 mm Hg (S). - Pericardium, extracardiac: There was no pericardial effusion.       Antimicrobials:   None  Objective: Vitals:   06/19/18 1358 06/19/18 1400 06/19/18 2111 06/20/18 0644  BP: 104/80 104/80 106/75 101/76  Pulse: 69 69 69 68  Resp: 20 19 11  (!) 22  Temp:   (!) 97.5 F (36.4 C) 98 F (  36.7 C)  TempSrc:   Oral Oral  SpO2: 95% 95% 93% 96%  Weight:    118.2 kg  Height:        Intake/Output Summary (Last 24 hours) at 06/20/2018 0943 Last data filed at 06/20/2018 0324 Gross per 24 hour  Intake 652 ml  Output 675  ml  Net -23 ml   Filed Weights   06/18/18 0644 06/19/18 0550 06/20/18 0644  Weight: 117.4 kg 117.8 kg 118.2 kg    Examination:  Awake Alert, Oriented X 3, No new F.N deficits, Normal affect [place].AT,PERRAL Supple Neck,No JVD, No cervical lymphadenopathy appriciated.  Symmetrical Chest wall movement, Good air movement bilaterally, CTAB RRR,No Gallops, Rubs or new Murmurs, No Parasternal Heave +ve B.Sounds, Abd Soft, No tenderness, No organomegaly appriciated, No rebound - guarding or rigidity. No Cyanosis, Clubbing or edema, No new Rash or bruise    Data Reviewed: I have personally reviewed following labs and imaging studies  CBC: Recent Labs  Lab 06/16/18 0558 06/17/18 0446 06/18/18 0342 06/19/18 0214 06/20/18 0302  WBC 6.4 7.2 7.6 7.3 6.5  HGB 12.6* 12.6* 12.6* 12.3* 11.6*  HCT 42.2 40.8 39.6 39.2 37.7*  MCV 92.1 90.7 89.2 89.1 89.1  PLT 171 163 157 145* 638   Basic Metabolic Panel: Recent Labs  Lab 06/13/18 1515 06/15/18 0330 06/19/18 0214 06/20/18 0302  NA 139 141 135 135  K 4.0 4.6 4.6 4.4  CL 106 107 99 100  CO2 23 25 24 23   GLUCOSE 180* 154* 142* 118*  BUN 16 21 41* 37*  CREATININE 1.01 1.18 1.44* 1.55*  CALCIUM 8.5* 8.4* 8.6* 8.4*   GFR: Estimated Creatinine Clearance: 48.9 mL/min (A) (by C-G formula based on SCr of 1.55 mg/dL (H)). Liver Function Tests: Recent Labs  Lab 06/13/18 1515  AST 50*  ALT 42  ALKPHOS 64  BILITOT 0.8  PROT 6.7  ALBUMIN 3.5   No results for input(s): LIPASE, AMYLASE in the last 168 hours. No results for input(s): AMMONIA in the last 168 hours. Coagulation Profile: No results for input(s): INR, PROTIME in the last 168 hours. Cardiac Enzymes: No results for input(s): CKTOTAL, CKMB, CKMBINDEX, TROPONINI in the last 168 hours. BNP (last 3 results) No results for input(s): PROBNP in the last 8760 hours. HbA1C: No results for input(s): HGBA1C in the last 72 hours. CBG: Recent Labs  Lab 06/19/18 0733 06/19/18 1201  06/19/18 1632 06/19/18 2111 06/20/18 0729  GLUCAP 128* 141* 158* 113* 113*   Lab Results  Component Value Date   TSH 1.859 06/16/2018    Lipid Profile: No results for input(s): CHOL, HDL, LDLCALC, TRIG, CHOLHDL, LDLDIRECT in the last 72 hours. Thyroid Function Tests: No results for input(s): TSH, T4TOTAL, FREET4, T3FREE, THYROIDAB in the last 72 hours. Anemia Panel: No results for input(s): VITAMINB12, FOLATE, FERRITIN, TIBC, IRON, RETICCTPCT in the last 72 hours. Sepsis Labs: Recent Labs  Lab 06/13/18 1529  LATICACIDVEN 1.68    No results found for this or any previous visit (from the past 240 hour(s)).    Radiology Studies: US Renal  Result Date: 06/20/2018 CLINICAL DATA:  Acute renal failure EXAM: RENAL / URINARY TRACT ULTRASOUND COMPLETE COMPARISON:  Abdominal CT 09/11/2014 FINDINGS: Right Kidney: Renal measurements: 12 x 5 x 5 cm = volume: 150 mL . Echogenicity within normal limits. No mass or hydronephrosis visualized. Left Kidney: Renal measurements: 12.6 x 6 x 5 cm = volume: 200 mL. Echogenicity within normal limits. No mass or hydronephrosis visualized. Bladder: Appears normal for  degree of bladder distention. Trace ascites seen about the liver IMPRESSION: Normal ultrasound of the kidneys and bladder. Trace ascites seen about the liver. Electronically Signed   By: Monte Fantasia M.D.   On: 06/20/2018 08:57     Scheduled Meds: . albuterol  3 mL Inhalation See admin instructions  . alfuzosin  10 mg Oral Daily  . amiodarone  200 mg Oral BID  . apixaban  5 mg Oral BID  . atorvastatin  20 mg Oral Daily  . gabapentin  100 mg Oral TID  . insulin aspart  0-15 Units Subcutaneous TID WC  . metoprolol succinate  50 mg Oral Daily   Continuous Infusions: . lactated ringers       LOS: 6 days   Time spent: 25 minutes  Signature  Lala Lund M.D on 06/20/2018 at 9:43 AM   -  To page go to [email or web address] - password [email or web address]

## 2018-06-20 NOTE — Progress Notes (Signed)
Progress Note  Patient Name: [name] Date of Encounter: 06/20/2018  Primary Cardiologist: Sherren Mocha, MD   Subjective   Patient awakened from sleep, lying almost flat without any trouble breathing.  He says that he still has shortness of breath with minimal activity.  No chest pain.  He slept the best he has slept last night.  He does have JVD, lower extremity edema and a few crackles in his lung bases.  Inpatient Medications    Scheduled Meds: . albuterol  3 mL Inhalation See admin instructions  . alfuzosin  10 mg Oral Daily  . amiodarone  200 mg Oral BID  . apixaban  5 mg Oral BID  . atorvastatin  20 mg Oral Daily  . furosemide  40 mg Oral Daily  . gabapentin  100 mg Oral TID  . insulin aspart  0-15 Units Subcutaneous TID WC  . metoprolol succinate  50 mg Oral Daily   Continuous Infusions:  PRN Meds: acetaminophen, ondansetron (ZOFRAN) IV, polyvinyl alcohol   Vital Signs    Vitals:   06/19/18 1358 06/19/18 1400 06/19/18 2111 06/20/18 0644  BP: 104/80 104/80 106/75 101/76  Pulse: 69 69 69 68  Resp: 20 19 11  (!) 22  Temp:   (!) 97.5 F (36.4 C) 98 F (36.7 C)  TempSrc:   Oral Oral  SpO2: 95% 95% 93% 96%  Weight:    118.2 kg  Height:        Intake/Output Summary (Last 24 hours) at 06/20/2018 0929 Last data filed at 06/20/2018 0324 Gross per 24 hour  Intake 652 ml  Output 675 ml  Net -23 ml   Filed Weights   06/18/18 0644 06/19/18 0550 06/20/18 0644  Weight: 117.4 kg 117.8 kg 118.2 kg    Telemetry    Sinus rhythm in the 60s-70s- Personally Reviewed  ECG    No new tracings- Personally Reviewed  Physical Exam   GEN: No acute distress.   Neck: + JVD Cardiac: RRR, no murmurs, rubs, or gallops.  Respiratory: Clear to auscultation bilaterally except few basilar crackles. GI: Soft, nontender, non-distended  MS:  Lower leg 1+ edema; No deformity. Neuro:  Nonfocal  Psych: Normal affect   Labs    Chemistry Recent Labs  Lab  06/13/18 1515 06/15/18 0330 06/19/18 0214 06/20/18 0302  NA 139 141 135 135  K 4.0 4.6 4.6 4.4  CL 106 107 99 100  CO2 23 25 24 23   GLUCOSE 180* 154* 142* 118*  BUN 16 21 41* 37*  CREATININE 1.01 1.18 1.44* 1.55*  CALCIUM 8.5* 8.4* 8.6* 8.4*  PROT 6.7  --   --   --   ALBUMIN 3.5  --   --   --   AST 50*  --   --   --   ALT 42  --   --   --   ALKPHOS 64  --   --   --   BILITOT 0.8  --   --   --   GFRNONAA >60 58* 45* 41*  GFRAA >60 >60 52* 48*  ANIONGAP 10 9 12 12      Hematology Recent Labs  Lab 06/18/18 0342 06/19/18 0214 06/20/18 0302  WBC 7.6 7.3 6.5  RBC 4.44 4.40 4.23  HGB 12.6* 12.3* 11.6*  HCT 39.6 39.2 37.7*  MCV 89.2 89.1 89.1  MCH 28.4 28.0 27.4  MCHC 31.8 31.4 30.8  RDW 14.2 14.2 14.2  PLT 157 145* 161    Cardiac EnzymesNo  results for input(s): TROPONINI in the last 168 hours.  Recent Labs  Lab 06/13/18 1527  TROPIPOC 0.05     BNP Recent Labs  Lab 06/13/18 1515  BNP 336.7*     DDimer No results for input(s): DDIMER in the last 168 hours.   Radiology    US Renal  Result Date: 06/20/2018 CLINICAL DATA:  Acute renal failure EXAM: RENAL / URINARY TRACT ULTRASOUND COMPLETE COMPARISON:  Abdominal CT 09/11/2014 FINDINGS: Right Kidney: Renal measurements: 12 x 5 x 5 cm = volume: 150 mL . Echogenicity within normal limits. No mass or hydronephrosis visualized. Left Kidney: Renal measurements: 12.6 x 6 x 5 cm = volume: 200 mL. Echogenicity within normal limits. No mass or hydronephrosis visualized. Bladder: Appears normal for degree of bladder distention. Trace ascites seen about the liver IMPRESSION: Normal ultrasound of the kidneys and bladder. Trace ascites seen about the liver. Electronically Signed   By: Monte Fantasia M.D.   On: 06/20/2018 08:57    Cardiac Studies   TEE 06/19/2018 Impression:  1. No LA thrombus, may proceed to DCCV.  2. Severe LV dilation with EF 25%, diffuse hypokinesis.  3. Mild RV dilation with moderate systolic  dysfunction.  4. Severe MR with restriction of posterior leaflet and evidence for a ruptured chord.    Echo 04/29/2018 LV EF: 55% - 60% Study Conclusions  - Left ventricle: The cavity size was normal. Wall thickness was increased in a pattern of moderate LVH. Systolic function was normal. The estimated ejection fraction was in the range of 55% to 60%. Features are consistent with a pseudonormal left ventricular filling pattern, with concomitant abnormal relaxation and increased filling pressure (grade 2 diastolic dysfunction). - Aortic valve: There was mild regurgitation. - Mitral valve: There was moderate regurgitation. - Left atrium: The atrium was severely dilated. - Right atrium: The atrium was mildly dilated. - Pulmonary arteries: Systolic pressure was severely increased. PA peak pressure: 75 mm Hg (S).  Echo 06/17/2018 LV EF: 20% Study Conclusions  - Left ventricle: The cavity size was severely dilated. There was mild focal basal hypertrophy of the septum. The estimated ejection fraction was 20%. Diffuse hypokinesis with regional variation. There is severe hypokinesis of the basal-midinferior myocardium. Indeterminate diastolic function. - Aortic valve: Mildly calcified annulus. Trileaflet; mildly calcified leaflets. There was mild regurgitation. Valve area (Vmax): 1.88 cm^2. - Mitral valve: Mildly calcified annulus. Mildly thickened leaflets. There was severe regurgitation. Regurgitant volume (PISA): 104 ml. - Left atrium: The atrium was mildly dilated. - Right ventricle: Systolic function was moderately to severely reduced. - Right atrium: The atrium was mildly dilated. Central venous pressure (est): 15 mm Hg. - Tricuspid valve: There was moderate regurgitation. - Pulmonary arteries: Systolic pressure was moderately to severely increased. PA peak pressure: 58 mm Hg (S). - Pericardium, extracardiac: There was no pericardial  effusion.   Patient Profile     83 y.o. male with a history of hypertension, chronic diastolic congestive heart failure, pulmonary hypertension, type II diabetes mellitus and neurocardiogenic syncope in 2016. He was admitted with rapid atrial fibrillation.   Assessment & Plan    Atrial fibrillation with RVR -On antiarrhythmic therapy with amiodarone, now on oral dosing -Patient underwent TEE guided DCCV yesterday with conversion to sinus rhythm with rate 60s-70s -He has been started on Eliquis 5 mg BID for stroke risk reduction with CHA2DS2/VAS Stroke Risk Score of at least 5 (CHF, HTN, age (2), DM) .  He is > 54 years old.  His creatinine  is 1.55 today, up from 1.18 on admission, likely related to diuresis.  He will need follow-up of labs as an outpatient, adjust Eliquis if serum creatinine remains >= 1.5.   Acute on chronic combined systolic and diastolic heart failure -Newly reduced LV function, likely tachycardia mediated cardiomyopathy -Echo in 04/2018 with EF 55-60%, echo 06/17/2018 with EF 20%, severe MR, mild AI, mild LAE, severely reduced RV EF, PA peak pressure 58 mmHg -At home the patient was on Lasix on an as-needed basis.  He has been diuresing here with 40 mg Lasix orally, daily. -Weight has been creeping up from 252 on admission to 260.5 today.  -Patient had only 675 mL urine output recorded yesterday.  He had been taking in excessive amounts of liquid which he was cautioned to reduce yesterday.  He has a net positive fluid balance of 1.8 L since admission. -Further recommendations per Dr. Harrell Gave.   Acute kidney injury -Serum creatinine has climbed from 1.01 on 06/14/2019 19-1.55 today.  -Question if related to hypoperfusion during A. fib with RVR versus related to diuresis -Renal ultrasound done by primary team with normal findings except trace ascites seen about the liver.   -Plan is to hold diuretic and nephrotoxins and repeat BMP in the morning  Severe  MR -Moderate MR on echo 2 months ago, question if severe MR related to volume overload.  Consider repeat limited echo once patient is fully diuresed as an outpatient -No murmur on exam  Hyperlipidemia -Atorvastatin 20 mg daily. No recent labs in [place].   Pulmonary hypertension  Diabetes type 2 -Last A1c was 7.5 on 04/27/18. Advise goal of <7.      For questions or updates, please contact [place] Please consult [email or web address] for contact info under        Signed, Daune Perch, NP  06/20/2018, 9:29 AM

## 2018-06-21 ENCOUNTER — Inpatient Hospital Stay (HOSPITAL_COMMUNITY): Payer: Medicare Other

## 2018-06-21 DIAGNOSIS — I5043 Acute on chronic combined systolic (congestive) and diastolic (congestive) heart failure: Secondary | ICD-10-CM

## 2018-06-21 DIAGNOSIS — R0602 Shortness of breath: Secondary | ICD-10-CM

## 2018-06-21 DIAGNOSIS — I480 Paroxysmal atrial fibrillation: Secondary | ICD-10-CM

## 2018-06-21 LAB — CBC
HCT: 37.8 % — ABNORMAL LOW (ref 39.0–52.0)
Hemoglobin: 11.9 g/dL — ABNORMAL LOW (ref 13.0–17.0)
MCH: 27.9 pg (ref 26.0–34.0)
MCHC: 31.5 g/dL (ref 30.0–36.0)
MCV: 88.5 fL (ref 80.0–100.0)
PLATELETS: 143 10*3/uL — AB (ref 150–400)
RBC: 4.27 MIL/uL (ref 4.22–5.81)
RDW: 14.1 % (ref 11.5–15.5)
WBC: 5.5 10*3/uL (ref 4.0–10.5)
nRBC: 0 % (ref 0.0–0.2)

## 2018-06-21 LAB — BASIC METABOLIC PANEL
Anion gap: 9 (ref 5–15)
BUN: 35 mg/dL — ABNORMAL HIGH (ref 8–23)
CO2: 23 mmol/L (ref 22–32)
Calcium: 8.4 mg/dL — ABNORMAL LOW (ref 8.9–10.3)
Chloride: 102 mmol/L (ref 98–111)
Creatinine, Ser: 1.5 mg/dL — ABNORMAL HIGH (ref 0.61–1.24)
GFR calc Af Amer: 50 mL/min — ABNORMAL LOW (ref >60)
GFR calc non Af Amer: 43 mL/min — ABNORMAL LOW (ref >60)
Glucose, Bld: 120 mg/dL — ABNORMAL HIGH (ref 70–99)
Potassium: 4 mmol/L (ref 3.5–5.1)
Sodium: 134 mmol/L — ABNORMAL LOW (ref 135–145)

## 2018-06-21 LAB — GLUCOSE, CAPILLARY
Glucose-Capillary: 112 mg/dL — ABNORMAL HIGH (ref 70–99)
Glucose-Capillary: 128 mg/dL — ABNORMAL HIGH (ref 70–99)
Glucose-Capillary: 98 mg/dL (ref 70–99)
Glucose-Capillary: 192 mg/dL — ABNORMAL HIGH (ref 70–99)

## 2018-06-21 MED ORDER — APIXABAN 2.5 MG PO TABS
2.5000 mg | ORAL_TABLET | Freq: Two times a day (BID) | ORAL | Status: DC
  Administered 2018-06-21 – 2018-06-22 (×3): 2.5 mg via ORAL
  Filled 2018-06-21 (×3): qty 1

## 2018-06-21 MED ORDER — FUROSEMIDE 10 MG/ML IJ SOLN
60.0000 mg | Freq: Once | INTRAMUSCULAR | Status: AC
  Administered 2018-06-21: 60 mg via INTRAVENOUS
  Filled 2018-06-21: qty 6

## 2018-06-21 NOTE — Progress Notes (Signed)
Patient ID: [name], male   DOB: 1935-04-21, 83 y.o.   MRN: [id number]   PROGRESS NOTE    [name]  VHQ:[id number] DOB: 1934/08/28 DOA: 06/13/2018 PCP: [name], MD    Brief Narrative:   83 year old male who presented with new onset atrial fibrillation. He does have significant past medical history for prostate cancer, diastolic heart failure, hypertension and pulmonary artery hypertension. Patient reported progressive dyspnea exertion for the last 6 months, he was seen by his primary care physician who found him in atrial fibrillation with rapid ventricular response, was referred for admission for evaluation.  Patient has had cardiac consultation.  Underwent cardioversion however now developing ARF.    Subjective: Patient in bed, appears comfortable, denies any headache, no fever, no chest pain or pressure, does have mild shortness of breath , no abdominal pain. No focal weakness.  Assessment & Plan:    #1. Newly diagnosed Atrial fibrillation with rapid ventricular response: [place] vas 2 score of at least 3.  Seen by cardiology, he underwent TEE with cardioversion and currently in sinus, currently placed on oral amiodarone, beta-blocker and Eliquis.  His TSH and transthoracic echocardiogram were stable, he was initially on amnio drip.  He is stable from cardiac standpoint except that he is developing worsening renal function.    #2 DM type II: Blood sugar is controlled at the moment.  On sliding scale insulin.  Continue monitoring.  CBG (last 3)  Recent Labs    06/20/18 1612 06/20/18 2120 06/21/18 0741  GLUCAP 127* 165* 112*     #3 Hypertension: Metoprolol stable and monitor.  #5 Pulmonary hypertension: Severe based on echo.  Continue per cardiology.  #6  Acute on chronic systolic and diastolic dysfunction CHF 41%: Likely decompensated, given IV Lasix 60 mg x 1 dose on 06/21/2018, continue beta-blocker, no ACE/ARB due to ARF.  Defer further work-up for wall motion  abnormality to cardiology.  #7.  ARF.  Although his UA suggests sodium of less than 10 clinically he has evidence of fluid overload and now getting short of breath, stop IV fluids and give him a trial of Lasix on 06/21/2018, repeat BMP tomorrow.  #8.  Dyslipidemia.  On statin.     DVT prophylaxis: Eliquis Code Status: Full code Family Communication: No family at bedside Disposition Plan: Home most likely  Consultants:   Dr. Acie Fredrickson cardiology  Procedures:    Renal ultrasound.  06/20/2018.  Nonacute.    Cardioversion with TEE by cardiology on 06/19/2018.  TTE  - Left ventricle: The cavity size was severely dilated. There was mild focal basal hypertrophy of the septum. The estimated ejection fraction was 20%. Diffuse hypokinesis with regional variation. There is severe hypokinesis of the basal-midinferior myocardium. Indeterminate diastolic function. - Aortic valve: Mildly calcified annulus. Trileaflet; mildly calcified leaflets. There was mild regurgitation. Valve area (Vmax): 1.88 cm^2. - Mitral valve: Mildly calcified annulus. Mildly thickened leaflets. There was severe regurgitation. Regurgitant volume (PISA): 104 ml. - Left atrium: The atrium was mildly dilated. - Right ventricle: Systolic function was moderately to severely reduced. - Right atrium: The atrium was mildly dilated. Central venous   pressure (est): 15 mm Hg. - Tricuspid valve: There was moderate regurgitation. - Pulmonary arteries: Systolic pressure was moderately to severely increased. PA peak pressure: 58 mm Hg (S). - Pericardium, extracardiac: There was no pericardial effusion.   Antimicrobials:   None  Objective: Vitals:   06/20/18 1438 06/20/18 2000 06/21/18 0455 06/21/18 0943  BP: 103/71 119/79 96/64 115/77  Pulse: 70 76 69 71  Resp: (!) 21 (!) 23 (!) 22   Temp: 97.7 F (36.5 C) 97.6 F (36.4 C) 98 F (36.7 C)   TempSrc: Oral  Oral   SpO2: 94% 94% 90%   Weight:   119.5 kg   Height:         Intake/Output Summary (Last 24 hours) at 06/21/2018 1007 Last data filed at 06/21/2018 0919 Gross per 24 hour  Intake 1946.96 ml  Output 1075 ml  Net 871.96 ml   Filed Weights   06/19/18 0550 06/20/18 0644 06/21/18 0455  Weight: 117.8 kg 118.2 kg 119.5 kg    Examination:  Awake Alert, Oriented X 3, No new F.N deficits, Normal affect [place].AT,PERRAL Supple Neck,No JVD, No cervical lymphadenopathy appriciated.  Symmetrical Chest wall movement, Good air movement bilaterally, positive Rales RRR,No Gallops, Rubs or new Murmurs, No Parasternal Heave +ve B.Sounds, Abd Soft, No tenderness, No organomegaly appriciated, No rebound - guarding or rigidity. No Cyanosis, Clubbing, 1+ edema today, No new Rash or bruise   Data Reviewed: I have personally reviewed following labs and imaging studies  CBC: Recent Labs  Lab 06/17/18 0446 06/18/18 0342 06/19/18 0214 06/20/18 0302 06/21/18 0258  WBC 7.2 7.6 7.3 6.5 5.5  HGB 12.6* 12.6* 12.3* 11.6* 11.9*  HCT 40.8 39.6 39.2 37.7* 37.8*  MCV 90.7 89.2 89.1 89.1 88.5  PLT 163 157 145* 161 846*   Basic Metabolic Panel: Recent Labs  Lab 06/15/18 0330 06/19/18 0214 06/20/18 0302 06/21/18 0258  NA 141 135 135 134*  K 4.6 4.6 4.4 4.0  CL 107 99 100 102  CO2 25 24 23 23   GLUCOSE 154* 142* 118* 120*  BUN 21 41* 37* 35*  CREATININE 1.18 1.44* 1.55* 1.50*  CALCIUM 8.4* 8.6* 8.4* 8.4*   GFR: Estimated Creatinine Clearance: 50.8 mL/min (A) (by C-G formula based on SCr of 1.5 mg/dL (H)). Liver Function Tests: No results for input(s): AST, ALT, ALKPHOS, BILITOT, PROT, ALBUMIN in the last 168 hours. No results for input(s): LIPASE, AMYLASE in the last 168 hours. No results for input(s): AMMONIA in the last 168 hours. Coagulation Profile: No results for input(s): INR, PROTIME in the last 168 hours. Cardiac Enzymes: No results for input(s): CKTOTAL, CKMB, CKMBINDEX, TROPONINI in the last 168 hours. BNP (last 3 results) No results for  input(s): PROBNP in the last 8760 hours. HbA1C: No results for input(s): HGBA1C in the last 72 hours. CBG: Recent Labs  Lab 06/20/18 0729 06/20/18 1143 06/20/18 1612 06/20/18 2120 06/21/18 0741  GLUCAP 113* 150* 127* 165* 112*   Lab Results  Component Value Date   TSH 1.859 06/16/2018    Lipid Profile: No results for input(s): CHOL, HDL, LDLCALC, TRIG, CHOLHDL, LDLDIRECT in the last 72 hours. Thyroid Function Tests: No results for input(s): TSH, T4TOTAL, FREET4, T3FREE, THYROIDAB in the last 72 hours. Anemia Panel: No results for input(s): VITAMINB12, FOLATE, FERRITIN, TIBC, IRON, RETICCTPCT in the last 72 hours. Sepsis Labs: No results for input(s): PROCALCITON, LATICACIDVEN in the last 168 hours.  No results found for this or any previous visit (from the past 240 hour(s)).    Radiology Studies: US Renal  Result Date: 06/20/2018 CLINICAL DATA:  Acute renal failure EXAM: RENAL / URINARY TRACT ULTRASOUND COMPLETE COMPARISON:  Abdominal CT 09/11/2014 FINDINGS: Right Kidney: Renal measurements: 12 x 5 x 5 cm = volume: 150 mL . Echogenicity within normal limits. No mass or hydronephrosis visualized. Left Kidney: Renal measurements: 12.6 x 6 x 5 cm =  volume: 200 mL. Echogenicity within normal limits. No mass or hydronephrosis visualized. Bladder: Appears normal for degree of bladder distention. Trace ascites seen about the liver IMPRESSION: Normal ultrasound of the kidneys and bladder. Trace ascites seen about the liver. Electronically Signed   By: Monte Fantasia M.D.   On: 06/20/2018 08:57     Scheduled Meds: . albuterol  3 mL Inhalation See admin instructions  . alfuzosin  10 mg Oral Daily  . amiodarone  200 mg Oral BID  . apixaban  2.5 mg Oral BID  . atorvastatin  20 mg Oral Daily  . furosemide  60 mg Intravenous Once  . gabapentin  100 mg Oral TID  . insulin aspart  0-15 Units Subcutaneous TID WC  . metoprolol succinate  50 mg Oral Daily   Continuous Infusions:     LOS: 7 days   Time spent: 25 minutes  Signature  Lala Lund M.D on 06/21/2018 at 10:07 AM   -  To page go to [email or web address] - password [email or web address]

## 2018-06-21 NOTE — Progress Notes (Addendum)
Progress Note  Patient Name: [name] Date of Encounter: 06/21/2018  Primary Cardiologist: Sherren Mocha, MD   Subjective   Mr Foushee still has dyspnea on exertion, JVD, lower leg edema.  He has no chest pain.  He does report that he has had dyspnea on exertion for the last 6 months.  Inpatient Medications    Scheduled Meds: . albuterol  3 mL Inhalation See admin instructions  . alfuzosin  10 mg Oral Daily  . amiodarone  200 mg Oral BID  . apixaban  5 mg Oral BID  . atorvastatin  20 mg Oral Daily  . gabapentin  100 mg Oral TID  . insulin aspart  0-15 Units Subcutaneous TID WC  . metoprolol succinate  50 mg Oral Daily   Continuous Infusions: . lactated ringers 75 mL/hr at 06/21/18 0040   PRN Meds: acetaminophen, ondansetron (ZOFRAN) IV, polyvinyl alcohol   Vital Signs    Vitals:   06/20/18 1023 06/20/18 1438 06/20/18 2000 06/21/18 0455  BP: 110/75 103/71 119/79 96/64  Pulse: 69 70 76 69  Resp:  (!) 21 (!) 23 (!) 22  Temp:  97.7 F (36.5 C) 97.6 F (36.4 C) 98 F (36.7 C)  TempSrc:  Oral  Oral  SpO2:  94% 94% 90%  Weight:    119.5 kg  Height:        Intake/Output Summary (Last 24 hours) at 06/21/2018 0703 Last data filed at 06/21/2018 0600 Gross per 24 hour  Intake 1946.96 ml  Output 700 ml  Net 1246.96 ml   Filed Weights   06/19/18 0550 06/20/18 0644 06/21/18 0455  Weight: 117.8 kg 118.2 kg 119.5 kg    Telemetry    Sinus rhythm in the 70s with occasional PACs- Personally Reviewed  ECG    No new tracings- Personally Reviewed  Physical Exam   GEN: No acute distress.   Neck: + JVD Cardiac: RRR, apical systolic murmur 1/6 Respiratory: Clear to auscultation bilaterally. GI: Soft, nontender, non-distended  MS:  1+ lower leg edema; No deformity. Neuro:  Nonfocal  Psych: Normal affect   Labs    Chemistry Recent Labs  Lab 06/19/18 0214 06/20/18 0302 06/21/18 0258  NA 135 135 134*  K 4.6 4.4 4.0  CL 99 100 102  CO2 24 23 23     GLUCOSE 142* 118* 120*  BUN 41* 37* 35*  CREATININE 1.44* 1.55* 1.50*  CALCIUM 8.6* 8.4* 8.4*  GFRNONAA 45* 41* 43*  GFRAA 52* 48* 50*  ANIONGAP 12 12 9      Hematology Recent Labs  Lab 06/19/18 0214 06/20/18 0302 06/21/18 0258  WBC 7.3 6.5 5.5  RBC 4.40 4.23 4.27  HGB 12.3* 11.6* 11.9*  HCT 39.2 37.7* 37.8*  MCV 89.1 89.1 88.5  MCH 28.0 27.4 27.9  MCHC 31.4 30.8 31.5  RDW 14.2 14.2 14.1  PLT 145* 161 143*    Cardiac EnzymesNo results for input(s): TROPONINI in the last 168 hours. No results for input(s): TROPIPOC in the last 168 hours.   BNPNo results for input(s): BNP, PROBNP in the last 168 hours.   DDimer No results for input(s): DDIMER in the last 168 hours.   Radiology    US Renal  Result Date: 06/20/2018 CLINICAL DATA:  Acute renal failure EXAM: RENAL / URINARY TRACT ULTRASOUND COMPLETE COMPARISON:  Abdominal CT 09/11/2014 FINDINGS: Right Kidney: Renal measurements: 12 x 5 x 5 cm = volume: 150 mL . Echogenicity within normal limits. No mass or hydronephrosis visualized. Left Kidney: Renal  measurements: 12.6 x 6 x 5 cm = volume: 200 mL. Echogenicity within normal limits. No mass or hydronephrosis visualized. Bladder: Appears normal for degree of bladder distention. Trace ascites seen about the liver IMPRESSION: Normal ultrasound of the kidneys and bladder. Trace ascites seen about the liver. Electronically Signed   By: Monte Fantasia M.D.   On: 06/20/2018 08:57    Cardiac Studies   TEE 06/19/2018 Impression:  1. No LA thrombus, may proceed to DCCV.  2. Severe LV dilation with EF 25%, diffuse hypokinesis.  3. Mild RV dilation with moderate systolic dysfunction.  4. Severe MR with restriction of posterior leaflet and evidence for a ruptured chord.   Echo 04/29/2018 LV EF: 55% - 60% Study Conclusions  - Left ventricle: The cavity size was normal. Wall thickness was increased in a pattern of moderate LVH. Systolic function was normal. The estimated  ejection fraction was in the range of 55% to 60%. Features are consistent with a pseudonormal left ventricular filling pattern, with concomitant abnormal relaxation and increased filling pressure (grade 2 diastolic dysfunction). - Aortic valve: There was mild regurgitation. - Mitral valve: There was moderate regurgitation. - Left atrium: The atrium was severely dilated. - Right atrium: The atrium was mildly dilated. - Pulmonary arteries: Systolic pressure was severely increased. PA peak pressure: 75 mm Hg (S).  Echo 06/17/2018 LV EF: 20% Study Conclusions  - Left ventricle: The cavity size was severely dilated. There was mild focal basal hypertrophy of the septum. The estimated ejection fraction was 20%. Diffuse hypokinesis with regional variation. There is severe hypokinesis of the basal-midinferior myocardium. Indeterminate diastolic function. - Aortic valve: Mildly calcified annulus. Trileaflet; mildly calcified leaflets. There was mild regurgitation. Valve area (Vmax): 1.88 cm^2. - Mitral valve: Mildly calcified annulus. Mildly thickened leaflets. There was severe regurgitation. Regurgitant volume (PISA): 104 ml. - Left atrium: The atrium was mildly dilated. - Right ventricle: Systolic function was moderately to severely reduced. - Right atrium: The atrium was mildly dilated. Central venous pressure (est): 15 mm Hg. - Tricuspid valve: There was moderate regurgitation. - Pulmonary arteries: Systolic pressure was moderately to severely increased. PA peak pressure: 58 mm Hg (S). - Pericardium, extracardiac: There was no pericardial effusion.   Patient Profile     83 y.o. male with a history of hypertension, chronic diastolic congestive heart failure, pulmonary hypertension, type II diabetes mellitusand neurocardiogenic syncope in 2016. He was admitted with rapid atrial fibrillation.  Assessment & Plan    Atrial fibrillation with  RVR -Maintaining sinus rhythm on amiodarone, status post successful electrical cardioversion -He has been started on Eliquis 5 mg BID for stroke risk reduction with CHA2DS2/VAS Stroke Risk Score of at least 5 (CHF, HTN, age (2), DM) .  He is > 69 years old.  His creatinine has been elevated.  His Eliquis dose may need adjustment if serum creatinine remains >= 1.5.   Acute on chronic combined systolic and diastolic heart failure -Patient with newly reduced LV function and wall motion abnormalities.  Possibly tachycardia mediated cardiomyopathy. -With his current renal function and need for uninterrupted anticoagulation a good candidate for a cardiac cath.  Also he is having no chest pain.  Dr. Harrell Gave recommends to follow-up with an echocardiogram as an outpatient and if low EF/wall motion abnormality persists, will need work-up at that time. -The patient continues to exhibit volume overload.  His weight is up another 3 pounds today.  Diuresis has been halted due to worsened renal function.  Labs appear to  indicate volume depletion and primary team is giving IV fluids.  -I will defer further recommendations to Dr. Harrell Gave  Acute kidney injury -Serum creatinine and BUN are elevated.  Primary team is giving IV fluids.  Serum creatinine is stable at 1.5, was 1.55 yesterday.  Possible sleep apnea -Apparently patient has been noted to have sleep apnea in the past but not on CPAP.  Recommend outpatient sleep study to evaluate for obstructive sleep apnea and arrange for therapy if needed.     For questions or updates, please contact [place] Please consult [email or web address] for contact info under        Signed, Daune Perch, NP  06/21/2018, 7:03 AM

## 2018-06-21 NOTE — Progress Notes (Signed)
Physical Therapy Treatment Patient Details Name: [name] MRN: [id number] DOB: 1935/03/06 Today's Date: 06/21/2018    History of Present Illness 83 year old male who presented with new onset A-fib. TKZ:SWFUXNAT cancer, diastolic heart failure, HTN, DM, PAH.  Patient reported progressive dyspnea exertion for the last 6 months, he was seen by his primary care physician who found him in A-fib with RVR, referred for admission for evaluation.  s/p cardioversion 12/16.    PT Comments    Pt admitted with above diagnosis. Pt currently with functional limitations due to the deficits listed below (see PT Problem List). Pt was able to ambulate and go up and down 18 steps today with min guard to min assist.  Pt unsteady at times and is frustrated by being in hospital so long.  Desat as well with up and down 18 steps with DOE 3/4.    Recommend HHPT and RW at home and discussed energy conservation and for pt to plan when he does steps with rest breaks.   Pt will benefit from skilled PT to increase their independence and safety with mobility to allow discharge to the venue listed below.    SATURATION QUALIFICATIONS: (This note is used to comply with regulatory documentation for home oxygen)  Patient Saturations on Room Air at Rest = 92%  Patient Saturations on Room Air while Ambulating = 87%  Patient Saturations on 2 Liters of oxygen while Ambulating = 94%  Please briefly explain why patient needs home oxygen:Pt needs O2 with activity after pt has incr activity such as long distance or up and down stairs.  May need home O2.    Follow Up Recommendations  Home health PT     Equipment Recommendations  Rolling walker with 5" wheels    Recommendations for Other Services       Precautions / Restrictions Precautions Precautions: Fall Precaution Comments: watch HR Restrictions Weight Bearing Restrictions: No    Mobility  Bed Mobility Overal bed mobility: Independent             General  bed mobility comments: Up in chair upon PT arrival.  Transfers Overall transfer level: Independent               General transfer comment: Stood from chair x1, no dizziness.  Ambulation/Gait Ambulation/Gait assistance: Supervision;Min guard Gait Distance (Feet): 200 Feet Assistive device: None Gait Pattern/deviations: Step-through pattern;Decreased stride length;Drifts right/left   Gait velocity interpretation: 1.31 - 2.62 ft/sec, indicative of limited community ambulator General Gait Details: Mildly unsteady gait- 3/4 DOE Sp02 remained >87% on RA. HR stable in NSR.   Stairs Stairs: Yes Stairs assistance: Min guard;Min assist Stair Management: Alternating pattern;One rail Left;Forwards Number of Stairs: 18 General stair comments: Cues for safety. CLose min guard for safety due to lines. Pt did lean into wall twice for balance.     Wheelchair Mobility    Modified Rankin (Stroke Patients Only)       Balance Overall balance assessment: Needs assistance Sitting-balance support: No upper extremity supported;Feet supported Sitting balance-Leahy Scale: Good     Standing balance support: No upper extremity supported;During functional activity;Single extremity supported Standing balance-Leahy Scale: Fair Standing balance comment: can stand statically without support.  Dynamic balance with minguard assist to supervision.                            Cognition Arousal/Alertness: Awake/alert Behavior During Therapy: WFL for tasks assessed/performed Overall Cognitive Status: Within Functional Limits for  tasks assessed                                        Exercises General Exercises - Lower Extremity Ankle Circles/Pumps: AROM;Both;10 reps;Seated Long Arc Quad: AROM;Both;10 reps;Seated    General Comments        Pertinent Vitals/Pain Pain Assessment: No/denies pain    Home Living                      Prior Function             PT Goals (current goals can now be found in the care plan section) Acute Rehab PT Goals Patient Stated Goal: to go home Progress towards PT goals: Progressing toward goals    Frequency    Min 3X/week      PT Plan Discharge plan needs to be updated;Equipment recommendations need to be updated    Co-evaluation              AM-PAC PT "6 Clicks" Mobility   Outcome Measure  Help needed turning from your back to your side while in a flat bed without using bedrails?: None Help needed moving from lying on your back to sitting on the side of a flat bed without using bedrails?: None Help needed moving to and from a bed to a chair (including a wheelchair)?: None Help needed standing up from a chair using your arms (e.g., wheelchair or bedside chair)?: None Help needed to walk in hospital room?: A Little Help needed climbing 3-5 steps with a railing? : A Lot 6 Click Score: 21    End of Session Equipment Utilized During Treatment: Gait belt;Oxygen Activity Tolerance: Patient limited by fatigue;Patient limited by lethargy Patient left: in chair;with call bell/phone within reach Nurse Communication: Mobility status PT Visit Diagnosis: Muscle weakness (generalized) (M62.81)     Time: 0263-7858 PT Time Calculation (min) (ACUTE ONLY): 17 min  Charges:  $Gait Training: 8-22 mins                     [place] Pager:  [phone]  Office:  [place] 06/21/2018, 12:01 PM

## 2018-06-21 NOTE — Progress Notes (Signed)
SATURATION QUALIFICATIONS: (This note is used to comply with regulatory documentation for home oxygen)  Patient Saturations on Room Air at Rest = 92%  Patient Saturations on Room Air while Ambulating = 87%  Patient Saturations on 2 Liters of oxygen while Ambulating = 94%  Please briefly explain why patient needs home oxygen:Pt needs O2 with activity after pt has incr activity such as long distance or up and down stairs.  May need home O2.   [place] Pager:  [phone]  Office:  [phone]

## 2018-06-22 ENCOUNTER — Telehealth: Payer: Self-pay | Admitting: Cardiology

## 2018-06-22 LAB — CBC
HCT: 39.1 % (ref 39.0–52.0)
Hemoglobin: 12.3 g/dL — ABNORMAL LOW (ref 13.0–17.0)
MCH: 28.6 pg (ref 26.0–34.0)
MCHC: 31.5 g/dL (ref 30.0–36.0)
MCV: 90.9 fL (ref 80.0–100.0)
Platelets: 178 10*3/uL (ref 150–400)
RBC: 4.3 MIL/uL (ref 4.22–5.81)
RDW: 14.2 % (ref 11.5–15.5)
WBC: 6.9 10*3/uL (ref 4.0–10.5)
nRBC: 0 % (ref 0.0–0.2)

## 2018-06-22 LAB — BASIC METABOLIC PANEL
ANION GAP: 11 (ref 5–15)
BUN: 29 mg/dL — ABNORMAL HIGH (ref 8–23)
CO2: 27 mmol/L (ref 22–32)
Calcium: 8.5 mg/dL — ABNORMAL LOW (ref 8.9–10.3)
Chloride: 102 mmol/L (ref 98–111)
Creatinine, Ser: 1.29 mg/dL — ABNORMAL HIGH (ref 0.61–1.24)
GFR calc non Af Amer: 52 mL/min — ABNORMAL LOW (ref >60)
GFR, EST AFRICAN AMERICAN: 60 mL/min — AB (ref >60)
Glucose, Bld: 157 mg/dL — ABNORMAL HIGH (ref 70–99)
Potassium: 3.5 mmol/L (ref 3.5–5.1)
Sodium: 140 mmol/L (ref 135–145)

## 2018-06-22 LAB — GLUCOSE, CAPILLARY
Glucose-Capillary: 111 mg/dL — ABNORMAL HIGH (ref 70–99)
Glucose-Capillary: 112 mg/dL — ABNORMAL HIGH (ref 70–99)

## 2018-06-22 MED ORDER — APIXABAN 5 MG PO TABS: 5.0000 mg | ORAL_TABLET | Freq: Two times a day (BID) | ORAL | Status: DC

## 2018-06-22 MED ORDER — FUROSEMIDE 10 MG/ML IJ SOLN
40.0000 mg | Freq: Once | INTRAMUSCULAR | Status: AC
  Administered 2018-06-22: 40 mg via INTRAVENOUS
  Filled 2018-06-22: qty 4

## 2018-06-22 MED ORDER — SPIRONOLACTONE 25 MG PO TABS
25.0000 mg | ORAL_TABLET | Freq: Every day | ORAL | Status: DC
  Administered 2018-06-22: 25 mg via ORAL
  Filled 2018-06-22: qty 1

## 2018-06-22 MED ORDER — AMIODARONE HCL 200 MG PO TABS: ORAL_TABLET | ORAL | 0 refills | Status: DC

## 2018-06-22 MED ORDER — LISINOPRIL 20 MG PO TABS: 20.0000 mg | ORAL_TABLET | Freq: Every day | ORAL | 1 refills | Status: DC

## 2018-06-22 MED ORDER — APIXABAN 5 MG PO TABS: 5.0000 mg | ORAL_TABLET | Freq: Two times a day (BID) | ORAL | 0 refills | Status: DC

## 2018-06-22 MED ORDER — METOPROLOL SUCCINATE ER 50 MG PO TB24: 50.0000 mg | ORAL_TABLET | Freq: Every day | ORAL | 0 refills | Status: DC

## 2018-06-22 MED ORDER — APIXABAN 2.5 MG PO TABS: 2.5000 mg | ORAL_TABLET | Freq: Two times a day (BID) | ORAL | 0 refills | Status: DC

## 2018-06-22 NOTE — Progress Notes (Signed)
Brief cardiology note:  Per primary team, patient planned for discharge today.  [facility] will sign off in anticipation of discharge.   Medication Recommendations:  apixaban 5 mg BID, amiodarone BID until loaded, then 200 mg daily, metoprolol succinate 50 mg BID. Would hold home lisinopril until BMET rechecked and stabilized as outpatient. Would give daily lasix and spironolactone until he returns to his baseline weight, then can dose lasix weight based as prior Other recommendations (labs, testing, etc):  BMET with PCP in one week Follow up as an outpatient:  We will arrange post hospital cardiology follow up.  Buford Dresser, MD, PhD [facility]  [address] [place], [place] [address] [phone]

## 2018-06-22 NOTE — Discharge Summary (Addendum)
[name] VEH:[id number] DOB: [birth year] DOA: 06/13/2018  PCP: [name], MD  Admit date: 06/13/2018  Discharge date: 06/22/2018  Admitted From: Home  Disposition:  Home   Recommendations for Outpatient Follow-up:   Follow up with PCP in 1-2 weeks  PCP Please obtain BMP/CBC, 2 view CXR in 1week,  (see Discharge instructions)   PCP Please follow up on the following pending results:    Home Health: None   Equipment/Devices: None  Consultations: Cards Discharge Condition: Stable CODE STATUS: Full   Diet Recommendation: Heart Healthy Low Carb, 1.5 L/day fluid restriction  Diet Order            Diet heart healthy/carb modified Room service appropriate? Yes; Fluid consistency: Thin; Fluid restriction: 1500 mL Fluid  Diet effective now               Chief Complaint  Patient presents with  . Atrial Fibrillation     Brief history of present illness from the day of admission and additional interim summary     83 year old male who presented with new onset atrial fibrillation. He does have significant past medical history for prostate cancer, diastolic heart failure, hypertension and pulmonary artery hypertension. Patient reported progressive dyspnea exertion for the last 6 months, he was seen by his primary care physician who found him in atrial fibrillation with rapid ventricular response, was referred for admission for evaluation.  Patient has had cardiac consultation.  Underwent cardioversion however now developing ARF.                                                                  Hospital Course   #1. Newly diagnosed Atrial fibrillation with rapid ventricular response: [place] vas 2 score of at least 3.  Seen by cardiology, he underwent TEE with cardioversion and currently in sinus, currently placed  on oral amiodarone, beta-blocker and Eliquis.  His TSH and transthoracic echocardiogram were stable, he will now be discharged on Eliquis and oral amiodarone along with low-dose beta-blocker.  Case discussed with cardiologist Dr. Al Pimple.    #2 DM type II:  Will continue home regimen unchanged follow with PCP in a week.  #3 Hypertension: Metoprolol stable and monitor.  # 5 Pulmonary hypertension: Severe based on echo.  Continue per cardiology.  # 6  Acute on chronic systolic and diastolic dysfunction CHF 83%: Likely decompensated, given IV Lasix 60 mg x 1 dose on 06/21/2018, continue beta-blocker, functional shortness of breath have resolved and return to normal, resume ACE inhibitor home dose in the next 2 to 3 days, other home medications resumed as well, discharged with outpatient cardiology and PCP follow-up within 1 to 2 weeks.  # 7.  ARF.    Dehydration later on she went into mild decompensation, was given IV on 06/21/2018 with  improvement in ARF and fluid status.  # 8.  Dyslipidemia.  On statin.    Discharge diagnosis     Principal Problem:   Atrial fibrillation with RVR ([place]) Active Problems:   Diabetes mellitus without complication (HCC)   PAH (pulmonary artery hypertension) ([place])   HTN (hypertension)   Atrial fibrillation [facility])    Discharge instructions    Discharge Instructions    Discharge instructions   Complete by:  As directed    Follow with Primary MD [name], MD in 7 days   Get CBC, CMP, 2 view Chest X ray -  checked  by Primary MD in 5-7 days   Activity: As tolerated with Full fall precautions use walker/cane & assistance as needed  Disposition Home    Diet: Heart Healthy & low carbohydrate diet, 1.5 L/day total fluid restriction.  Special Instructions: If you have smoked or chewed Tobacco  in the last 2 yrs please stop smoking, stop any regular Alcohol  and or any Recreational drug use.  On your next visit with your primary care  physician please Get Medicines reviewed and adjusted.  Please request your Prim.MD to go over all Hospital Tests and Procedure/Radiological results at the follow up, please get all Hospital records sent to your Prim MD by signing hospital release before you go home.  If you experience worsening of your admission symptoms, develop shortness of breath, life threatening emergency, suicidal or homicidal thoughts you must seek medical attention immediately by calling 911 or calling your MD immediately  if symptoms less severe.  You Must read complete instructions/literature along with all the possible adverse reactions/side effects for all the Medicines you take and that have been prescribed to you. Take any new Medicines after you have completely understood and accpet all the possible adverse reactions/side effects.   Increase activity slowly   Complete by:  As directed       Discharge Medications   Allergies as of 06/22/2018      Reactions   Aspirin Other (See Comments)   History of "stomach ulcers," was told to not take this       Medication List    STOP taking these medications   amLODipine 10 MG tablet Commonly known as:  NORVASC     TAKE these medications   alfuzosin 10 MG 24 hr tablet Commonly known as:  UROXATRAL Take 10 mg by mouth daily.   amiodarone 200 MG tablet Commonly known as:  PACERONE Take 200 mg twice a day for 10 days thereafter continue once a day only   apixaban 5 MG Tabs tablet Commonly known as:  ELIQUIS Take 1 tablet (5 mg total) by mouth 2 (two) times daily.   atorvastatin 20 MG tablet Commonly known as:  LIPITOR Take 1 tablet (20 mg total) by mouth daily.   CALTRATE 600 PLUS-VIT D PO Take 1 tablet by mouth daily.   furosemide 20 MG tablet Commonly known as:  LASIX Take 1 tablet (20 mg total) by mouth daily as needed for up to 20 days for fluid or edema (as needed in case of swelling, or weight gain 3 lbs in 24 hours or 5lbs in seven days.). What  changed:  reasons to take this   gabapentin 100 MG capsule Commonly known as:  NEURONTIN Take 1 capsule (100 mg total) by mouth 3 (three) times daily.   lisinopril 20 MG tablet Commonly known as:  PRINIVIL,ZESTRIL Take 1 tablet (20 mg total) by mouth daily. Start taking on:  June 24, 2018 What changed:  These instructions start on June 24, 2018. If you are unsure what to do until then, ask your doctor or other care provider.   metFORMIN 500 MG tablet Commonly known as:  GLUCOPHAGE Take 1 tablet (500 mg total) by mouth 2 (two) times daily with a meal.   metoprolol succinate 50 MG 24 hr tablet Commonly known as:  TOPROL-XL Take 1 tablet (50 mg total) by mouth daily. Take with or immediately following a meal. Start taking on:  June 23, 2018   potassium chloride SA 20 MEQ tablet Commonly known as:  K-DUR,KLOR-CON Take 1 tablet (20 mEq total) by mouth daily as needed for up to 20 days (Take only if taking furosemide (Lasix)). What changed:  reasons to take this   PROAIR HFA 108 (90 Base) MCG/ACT inhaler Generic drug:  albuterol Inhale 2 puffs into the lungs See admin instructions. Inhale 2 puffs into lungs every 4-6 hours as needed for shortness of breath or wheezing   SYSTANE COMPLETE 0.6 % Soln Generic drug:  Propylene Glycol Place 1-2 drops into both eyes 3 (three) times daily as needed (for dryness).       Follow-up Information    [name], MD. Schedule an appointment as soon as possible for a visit in 1 week(s).   Specialty:  Internal Medicine Contact information: [place] [address] [phone]        Thompson Grayer, MD. Schedule an appointment as soon as possible for a visit in 1 week(s).   Specialty:  Cardiology Contact information: [place] [address] [phone]        Sherren Mocha, MD .   Specialty:  Cardiology Contact information: [address] [place]  [address] [phone]           Major procedures and Radiology Reports - PLEASE review detailed and final reports thoroughly  -     Renal ultrasound.  06/20/2018.  Nonacute.    Cardioversion with TEE by cardiology on 06/19/2018.  TTE  - Left ventricle: The cavity size was severely dilated. There was mild focal basal hypertrophy of the septum. The estimated ejection fraction was 20%. Diffuse hypokinesis with regionalvariation. There is severe hypokinesis of the basal-midinferior myocardium. Indeterminate diastolic function. - Aortic valve: Mildly calcified annulus. Trileaflet; mildly calcified leaflets. There was mild regurgitation. Valve area(Vmax): 1.88 cm^2. - Mitral valve: Mildly calcified annulus. Mildly thickened leaflets. There was severe regurgitation. Regurgitant volume(PISA): 104 ml. - Left atrium: The atrium was mildly dilated. - Right ventricle: Systolic function was moderately to severelyreduced. - Right atrium: The atrium was mildly dilated. Central venous pressure (est): 15 mm Hg. - Tricuspid valve: There was moderate regurgitation. - Pulmonary arteries: Systolic pressure was moderately to severely increased. PA peak pressure: 58 mm Hg (S). - Pericardium, extracardiac: There was no pericardial effusion.  Dg Chest 2 View  Result Date: 06/21/2018 CLINICAL DATA:  Shortness of breath EXAM: CHEST - 2 VIEW COMPARISON:  06/13/2018 CT chest FINDINGS: There is mild bilateral interstitial thickening. There are trace bilateral pleural effusions. There is no pneumothorax. There is stable cardiomegaly. The osseous structures are unremarkable. IMPRESSION: 1. Mild CHF. Electronically Signed   By: Kathreen Devoid   On: 06/21/2018 11:07   Ct Angio Chest Pe W And/or Wo Contrast  Result Date: 06/13/2018 CLINICAL DATA:  New onset AFib EXAM: CT ANGIOGRAPHY CHEST WITH CONTRAST TECHNIQUE: Multidetector CT imaging of the chest was performed using the  standard protocol during bolus  administration of intravenous contrast. Multiplanar CT image reconstructions and MIPs were obtained to evaluate the vascular anatomy. CONTRAST:  19mL ISOVUE-370 IOPAMIDOL (ISOVUE-370) INJECTION 76% COMPARISON:  Chest x-ray 06/13/2018, CT chest 04/28/2018 FINDINGS: Cardiovascular: Satisfactory opacification of the pulmonary arteries to the segmental level. Poor opacification of lower order branch vessels, most notable in the right greater than left upper lobes which limits evaluation for distal emboli. No acute filling defects are seen within the central vessels. Mild aortic atherosclerosis. Aneurysmal dilatation of the ascending aorta up to 4.1 cm. Cardiomegaly. Coronary vascular calcification. No large pericardial effusion Mediastinum/Nodes: Midline trachea. No thyroid mass. No change in 15 mm low right paratracheal lymph node. Esophagus within normal limits. Lungs/Pleura: Small bilateral pleural effusions are increased compared to prior. There is no acute consolidation or pneumothorax. Bleb in the right upper lobe. Upper Abdomen: No acute abnormality. Musculoskeletal: No chest wall abnormality. No acute or significant osseous findings. Review of the MIP images confirms the above findings. IMPRESSION: 1. Limited evaluation of distal pulmonary arterial vessels due to poor opacification peripherally. No definite acute central embolus is seen. 2. Cardiomegaly.  Small bilateral pleural effusions Aortic Atherosclerosis (ICD10-I70.0). Electronically Signed   By: Donavan Foil M.D.   On: 06/13/2018 19:15   US Renal  Result Date: 06/20/2018 CLINICAL DATA:  Acute renal failure EXAM: RENAL / URINARY TRACT ULTRASOUND COMPLETE COMPARISON:  Abdominal CT 09/11/2014 FINDINGS: Right Kidney: Renal measurements: 12 x 5 x 5 cm = volume: 150 mL . Echogenicity within normal limits. No mass or hydronephrosis visualized. Left Kidney: Renal measurements: 12.6 x 6 x 5 cm = volume: 200 mL. Echogenicity within normal limits. No mass or  hydronephrosis visualized. Bladder: Appears normal for degree of bladder distention. Trace ascites seen about the liver IMPRESSION: Normal ultrasound of the kidneys and bladder. Trace ascites seen about the liver. Electronically Signed   By: Monte Fantasia M.D.   On: 06/20/2018 08:57   Dg Chest Port 1 View  Result Date: 06/13/2018 CLINICAL DATA:  Chronic six-month history of shortness of breath. Personal history of prostate cancer diagnosed in 2005. Current history of hypertension and prediabetes. EXAM: PORTABLE CHEST 1 VIEW COMPARISON:  CTA chest 04/28/2018. Chest x-rays 04/27/2018 and earlier. FINDINGS: Cardiac silhouette moderately enlarged, unchanged. Thoracic aorta tortuous and mildly atherosclerotic, unchanged. Hilar and mediastinal contours otherwise unremarkable. Scarring at the LEFT lung base, unchanged. Lungs otherwise clear. Pulmonary vascularity normal. No visible pleural effusions. IMPRESSION: Stable cardiomegaly without pulmonary edema. Stable scarring at the LEFT lung base. No acute cardiopulmonary disease. Electronically Signed   By: Evangeline Dakin M.D.   On: 06/13/2018 15:36    Micro Results     No results found for this or any previous visit (from the past 240 hour(s)).  Today   Subjective    [name] today has no headache,no chest abdominal pain,no new weakness tingling or numbness, feels much better wants to go home today.     Objective   Blood pressure 129/82, pulse 74, temperature 97.6 F (36.4 C), temperature source Oral, resp. rate 18, height 5\' 11"  (1.803 m), weight 119.5 kg, SpO2 90 %.   Intake/Output Summary (Last 24 hours) at 06/22/2018 0937 Last data filed at 06/21/2018 1710 Gross per 24 hour  Intake 221.8 ml  Output 1275 ml  Net -1053.2 ml    Exam  Awake Alert, Oriented x 3, No new F.N deficits, Normal affect [place].AT,PERRAL Supple Neck,No JVD, No cervical lymphadenopathy appriciated.  Symmetrical Chest wall movement, Good  air movement  bilaterally, CTAB RRR,No Gallops,Rubs or new Murmurs, No Parasternal Heave +ve B.Sounds, Abd Soft, Non tender, No organomegaly appriciated, No rebound -guarding or rigidity. No Cyanosis, Clubbing or edema, No new Rash or bruise   Data Review   CBC w Diff:  Lab Results  Component Value Date   WBC 6.9 06/22/2018   HGB 12.3 (L) 06/22/2018   HCT 39.1 06/22/2018   PLT 178 06/22/2018   LYMPHOPCT 33 04/28/2018   MONOPCT 8 04/28/2018   EOSPCT 2 04/28/2018   BASOPCT 1 04/28/2018    CMP:  Lab Results  Component Value Date   NA 140 06/22/2018   K 3.5 06/22/2018   CL 102 06/22/2018   CO2 27 06/22/2018   BUN 29 (H) 06/22/2018   CREATININE 1.29 (H) 06/22/2018   PROT 6.7 06/13/2018   ALBUMIN 3.5 06/13/2018   BILITOT 0.8 06/13/2018   ALKPHOS 64 06/13/2018   AST 50 (H) 06/13/2018   ALT 42 06/13/2018  .   Total Time in preparing paper work, data evaluation and todays exam - 29 minutes  Lala Lund M.D on 06/22/2018 at 9:37 AM  [facility]   Office  [phone]

## 2018-06-22 NOTE — Care Management Note (Signed)
Case Management Note  Patient Details  Name: [name] MRN: [id number] Date of Birth: January 29, 1935  Subjective/Objective:  Pt presented for Atrial Fib. PTA from home alone. Staff RN did re-ambulate the patient and patient does not qualify for home 02.                   Action/Plan: PT recommendations for [facility] PT- Patient declines needing services. Pt is aware to contact PCP if needs any additional services post transition home. No further needs from CM at this time.   Expected Discharge Date:  06/22/18               Expected Discharge Plan:  Home/Self Care  In-House Referral:  NA  Discharge planning Services  CM Consult  Post Acute Care Choice:  NA Choice offered to:  NA  DME Arranged:  N/A DME Agency:  NA  HH Arranged:  NA HH Agency:  NA  Status of Service:  Completed, signed off  If discussed at [place] of Stay Meetings, dates discussed:    Additional Comments:  Bethena Roys, RN 06/22/2018, 11:33 AM

## 2018-06-22 NOTE — Progress Notes (Signed)
Patient ambulated x350 feet in the hallway. Steady gait with no assistive devices. Patient maintained oxygen saturations >94% at all time. Patient became slightly short of breath towards end of the walk, but overall tolerated ambulation well.

## 2018-06-22 NOTE — Telephone Encounter (Signed)
° °  TOC appt requested by Pecolia Ades   Appt scheduled for 1/6 with [facility] (no appt on care team 1)

## 2018-06-23 NOTE — Telephone Encounter (Signed)
  Patient contacted regarding discharge from [facility] on 06/22/18 Patient understands to follow up with provider Cecilie Kicks NP  on 07/10/17 at 9:00 AM  at [facility] Patient understands discharge instructions? es  Patient understands medications and regiment? yes  Patient understands to bring all medications to this visit? yes PT STILL  NOTES SOB AND WEAKNESS WILL CALL IF HAS QUESTIONS OR INCREASE OR NO IMPROVEMENT IN SYMPTOMS

## 2018-07-03 ENCOUNTER — Encounter (HOSPITAL_COMMUNITY): Payer: Self-pay | Admitting: Cardiology

## 2018-07-09 NOTE — Progress Notes (Deleted)
Cardiology Office Note   Date:  07/09/2018   ID:  [name], DOB Nov 04, 1934, MRN [id number]  PCP:  [name], MD  Cardiologist:  Dr. Burt Knack    No chief complaint on file.     History of Present Illness: [name] is a 84 y.o. male who presents for post hospital   84 y.o.malewith a history of hypertension, chronic diastolic congestive heart failure, pulmonary hypertension, type II diabetes mellitusand neurocardiogenic syncope in 2016. He was admitted with rapid atrial fibrillation.  Placed on amiodarone and underwent DCCV and on Eliquis for CHA2DS2VASc of 5. He did have decrease in EF 20%  thought to be due to rapid A fib.   No chest pain, plan for out pt echo , if EF remains low would need cath depending on Cr.  He had AKI with hospitalization,  also possible sleep apnea, needing sleep study.   nuc study in 2016 was low risk.     Past Medical History:  Diagnosis Date  . Abnormal PSA 01/17/2012   10.38/4/5/ PSA:2013=6.96/ PSA 07/08/2011=6.26/ PSA 2006=8.6  . Blood transfusion without reported diagnosis    during vagotomy 1976  . Cancer [facility]) 2006   L-2 solitary metastatic lesion/no rad tx ,asymptomatic  . Chronic kidney disease    nephrolithiasis right kidney  . Gynecomastia 04/21/11   seen by Dr.Murray, no rad tx  . History of echocardiogram    echo 4/16:  mild LVH, EF 55-60%, Gr 1 DD, no RWMA, mild AI, mild MR, mod LAE, mild TR  . Hx of cardiovascular stress test    Myoview 5/16:  EF 50%, no scar or ischemia, low risk  . Hydroureteronephrosis    right   . Hypertension   . Osteopenia   . Overweight   . Prostate cancer ([place]) 12/09/03 dx   Prostate ca/adenocarcinoma,gleason=3+3=6    pSA 4.6  . Sleep apnea    pt unaware  . Stroke [facility])    asymptomatic, found on head imaging  . Ulcer    peptic  . Use of leuprolide acetate (Lupron) 06/2005-11/2006   02/2008 degarelix 02/2008 x 3 doses    Past Surgical History:  Procedure Laterality Date  . CARDIOVERSION N/A  06/19/2018   Procedure: CARDIOVERSION;  Surgeon: Larey Dresser, MD;  Location: [facility] ENDOSCOPY;  Service: Cardiovascular;  Laterality: N/A;  . PROSTATE BIOPSY  12/09/03   adenocarcinoma,glerason: 3+3=6  . TEE WITHOUT CARDIOVERSION N/A 06/19/2018   Procedure: TRANSESOPHAGEAL ECHOCARDIOGRAM (TEE);  Surgeon: Larey Dresser, MD;  Location: [facility] ENDOSCOPY;  Service: Cardiovascular;  Laterality: N/A;  . VAGOTOMY  1978   partial     Current Outpatient Medications  Medication Sig Dispense Refill  . alfuzosin (UROXATRAL) 10 MG 24 hr tablet Take 10 mg by mouth daily.  11  . amiodarone (PACERONE) 200 MG tablet Take 200 mg twice a day for 10 days thereafter continue once a day only 40 tablet 0  . apixaban (ELIQUIS) 5 MG TABS tablet Take 1 tablet (5 mg total) by mouth 2 (two) times daily. 60 tablet 0  . atorvastatin (LIPITOR) 20 MG tablet Take 1 tablet (20 mg total) by mouth daily. 30 tablet 0  . Calcium-Vitamin D (CALTRATE 600 PLUS-VIT D PO) Take 1 tablet by mouth daily.     . furosemide (LASIX) 20 MG tablet Take 1 tablet (20 mg total) by mouth daily as needed for up to 20 days for fluid or edema (as needed in case of swelling, or weight gain 3 lbs in  24 hours or 5lbs in seven days.). (Patient taking differently: Take 20 mg by mouth daily as needed for fluid or edema (as needed in case of swelling, or weight gain 3 lbs in 24 hours or 5lbs in seven days). ) 20 tablet 0  . gabapentin (NEURONTIN) 100 MG capsule Take 1 capsule (100 mg total) by mouth 3 (three) times daily. 90 capsule 0  . lisinopril (PRINIVIL,ZESTRIL) 20 MG tablet Take 1 tablet (20 mg total) by mouth daily.  1  . metFORMIN (GLUCOPHAGE) 500 MG tablet Take 1 tablet (500 mg total) by mouth 2 (two) times daily with a meal. 60 tablet 0  . metoprolol succinate (TOPROL-XL) 50 MG 24 hr tablet Take 1 tablet (50 mg total) by mouth daily. Take with or immediately following a meal. 30 tablet 0  . potassium chloride SA (K-DUR,KLOR-CON) 20 MEQ tablet Take 1  tablet (20 mEq total) by mouth daily as needed for up to 20 days (Take only if taking furosemide (Lasix)). (Patient taking differently: Take 20 mEq by mouth daily as needed (only if taking Furosemide). ) 20 tablet 0  . PROAIR HFA 108 (90 Base) MCG/ACT inhaler Inhale 2 puffs into the lungs See admin instructions. Inhale 2 puffs into lungs every 4-6 hours as needed for shortness of breath or wheezing  0  . Propylene Glycol (SYSTANE COMPLETE) 0.6 % SOLN Place 1-2 drops into both eyes 3 (three) times daily as needed (for dryness).     No current facility-administered medications for this visit.     Allergies:   Aspirin    Social History:  The patient  reports that he quit smoking about 34 years ago. His smoking use included pipe. He has never used smokeless tobacco. He reports that he does not drink alcohol or use drugs.   Family History:  The patient's ***family history is not on file.    ROS:  General:no colds or fevers, no weight changes Skin:no rashes or ulcers HEENT:no blurred vision, no congestion CV:see HPI PUL:see HPI GI:no diarrhea constipation or melena, no indigestion GU:no hematuria, no dysuria MS:no joint pain, no claudication Neuro:no syncope, no lightheadedness Endo:no diabetes, no thyroid disease Wt Readings from Last 3 Encounters:  06/21/18 263 lb 6.4 oz (119.5 kg)  04/29/18 239 lb 3.2 oz (108.5 kg)  07/20/15 240 lb 11.2 oz (109.2 kg)     PHYSICAL EXAM: VS:  There were no vitals taken for this visit. , BMI There is no height or weight on file to calculate BMI. General:Pleasant affect, NAD Skin:Warm and dry, brisk capillary refill HEENT:normocephalic, sclera clear, mucus membranes moist Neck:supple, no JVD, no bruits  Heart:S1S2 RRR without murmur, gallup, rub or click Lungs:clear without rales, rhonchi, or wheezes XAJ:OINO, non tender, + BS, do not palpate liver spleen or masses Ext:no lower ext edema, 2+ pedal pulses, 2+ radial pulses Neuro:alert and oriented,  MAE, follows commands, + facial symmetry    EKG:  EKG is ordered today. The ekg ordered today demonstrates ***   Recent Labs: 04/28/2018: Magnesium 2.3 06/13/2018: ALT 42; B Natriuretic Peptide 336.7 06/16/2018: TSH 1.859 06/22/2018: BUN 29; Creatinine, Ser 1.29; Hemoglobin 12.3; Platelets 178; Potassium 3.5; Sodium 140    Lipid Panel    Component Value Date/Time   CHOL 118 07/20/2015 0529   TRIG 86 07/20/2015 0529   HDL 35 (L) 07/20/2015 0529   CHOLHDL 3.4 07/20/2015 0529   VLDL 17 07/20/2015 0529   LDLCALC 66 07/20/2015 0529       Other studies Reviewed:  Additional studies/ records that were reviewed today include: ***.   ASSESSMENT AND PLAN:  1.  ***   Current medicines are reviewed with the patient today.  The patient Has no concerns regarding medicines.  The following changes have been made:  See above Labs/ tests ordered today include:see above  Disposition:   FU:  see above  Signed, Cecilie Kicks, NP  07/09/2018 10:04 PM    [address], [address] [place], [place] Phone: [phone]; Fax: [phone]

## 2018-07-10 ENCOUNTER — Ambulatory Visit: Payer: Medicare Other | Admitting: Cardiology

## 2018-07-11 ENCOUNTER — Encounter: Payer: Self-pay | Admitting: Cardiology

## 2018-07-13 NOTE — Telephone Encounter (Signed)
  Patient no showed for TOC appt 07/10/18. Patient refused TOC appt when he called to reschedule because it was too early. Patient has appt with [name] 08/10/18.

## 2018-07-25 ENCOUNTER — Encounter: Payer: Self-pay | Admitting: Cardiology

## 2018-08-06 ENCOUNTER — Other Ambulatory Visit: Payer: Self-pay

## 2018-08-06 ENCOUNTER — Emergency Department (HOSPITAL_COMMUNITY)
Admission: EM | Admit: 2018-08-06 | Discharge: 2018-08-06 | Disposition: A | Discharge status: Home / Self Care | Payer: Medicare Other | Attending: Emergency Medicine | Admitting: Emergency Medicine

## 2018-08-06 DIAGNOSIS — I4891 Unspecified atrial fibrillation: Secondary | ICD-10-CM | POA: Insufficient documentation

## 2018-08-06 DIAGNOSIS — E119 Type 2 diabetes mellitus without complications: Secondary | ICD-10-CM | POA: Diagnosis not present

## 2018-08-06 DIAGNOSIS — Z7984 Long term (current) use of oral hypoglycemic drugs: Secondary | ICD-10-CM | POA: Diagnosis not present

## 2018-08-06 DIAGNOSIS — Z7901 Long term (current) use of anticoagulants: Secondary | ICD-10-CM | POA: Diagnosis not present

## 2018-08-06 DIAGNOSIS — Z87891 Personal history of nicotine dependence: Secondary | ICD-10-CM | POA: Diagnosis not present

## 2018-08-06 DIAGNOSIS — R040 Epistaxis: Secondary | ICD-10-CM | POA: Insufficient documentation

## 2018-08-06 DIAGNOSIS — N189 Chronic kidney disease, unspecified: Secondary | ICD-10-CM | POA: Insufficient documentation

## 2018-08-06 DIAGNOSIS — I5032 Chronic diastolic (congestive) heart failure: Secondary | ICD-10-CM | POA: Diagnosis not present

## 2018-08-06 DIAGNOSIS — I130 Hypertensive heart and chronic kidney disease with heart failure and stage 1 through stage 4 chronic kidney disease, or unspecified chronic kidney disease: Secondary | ICD-10-CM | POA: Diagnosis not present

## 2018-08-06 DIAGNOSIS — Z79899 Other long term (current) drug therapy: Secondary | ICD-10-CM | POA: Diagnosis not present

## 2018-08-06 MED ORDER — CEPHALEXIN 250 MG PO CAPS: 250.0000 mg | ORAL_CAPSULE | Freq: Four times a day (QID) | ORAL | 0 refills | Status: DC

## 2018-08-06 MED ORDER — OXYMETAZOLINE HCL 0.05 % NA SOLN
1.0000 | Freq: Once | NASAL | Status: AC
  Administered 2018-08-06: 1 via NASAL
  Filled 2018-08-06: qty 15

## 2018-08-06 NOTE — Discharge Instructions (Addendum)
You were seen in the emergency department for 1 week of intermittent nosebleeds.  It is stopped when you were here but likely will recur.  We talked about some strategies to control this but I am also asking you to contact ENT for follow-up with them this week.  Please return if uncontrolled bleeding.

## 2018-08-06 NOTE — ED Notes (Signed)
Pt ambulated down hallway and back without any difficulty or without feeling dizzy.

## 2018-08-06 NOTE — ED Provider Notes (Signed)
[facility] DEPT Provider Note   CSN: [id number] Arrival date & time: 08/06/18  1059     History   Chief Complaint Chief Complaint  Patient presents with  . Epistaxis    HPI [name] is a 84 y.o. male.  He is complaining of a nosebleed from the right side that is been going on and off for over a week.  He was recently put on Eliquis for A. fib.  He denies any nasal trauma.  He said he is been troubled by nosebleeds before.  He does not have any ear nose throat doctor.  No fevers no shortness of breath.  The history is provided by the patient.  Epistaxis  Location:  R nare Severity:  Moderate Duration:  1 week Timing:  Intermittent Progression:  Unchanged Chronicity:  New Context: anticoagulants   Context: not foreign body, not home oxygen, not nose picking and not trauma   Relieved by:  Applying pressure Worsened by:  Sneezing Ineffective treatments:  Applying pressure Associated symptoms: no fever, no headaches and no sore throat   Risk factors: no recent nasal surgery     Past Medical History:  Diagnosis Date  . A-fib ([place])    RVR-DCCV  . Abnormal PSA 01/17/2012   10.38/4/5/ PSA:2013=6.96/ PSA 07/08/2011=6.26/ PSA 2006=8.6  . 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 Blood transfusion without reported diagnosis    during vagotomy 1976  . Cancer [facility]) 2006   L-2 solitary metastatic lesion/no rad tx ,asymptomatic  . Chronic kidney disease    nephrolithiasis right kidney  . ED (erectile dysfunction)   . GERD (gastroesophageal reflux disease)    PUD S/P PARTIAL VAGOTOMY  . Gynecomastia 04/21/11   seen by Dr.Murray, no rad tx  . H/O impacted cerumen    S/P ENT DR BATES  . History of echocardiogram    echo 4/16:  mild LVH, EF 55-60%, Gr 1 DD, no RWMA, mild AI, mild MR, mod LAE, mild TR  . Hx of cardiovascular stress test    Myoview 5/16:  EF 50%, no scar or ischemia, low risk  . Hydroureteronephrosis    right   .  Hyperglycemia    WITHOUT DM2  . Hyperlipidemia   . Hypertension   . Insomnia   . Migraines    WITH AURA  . Nephrolithiasis   . Osteopenia   . Overweight   . Prostate cancer ([place]) 12/09/03 dx   Prostate ca/adenocarcinoma,gleason=3+3=6    pSA 4.6  . Prostate cancer ([place])    METASTATIC WITH L2 SCLEROTIC LESION ON LUPRON------DR. WOODRUFF  . RBBB (right bundle branch block)   . Rosacea   . Sinus bradycardia   . Sleep apnea    pt unaware  . Stroke [facility])    asymptomatic, found on head imaging  . Ulcer    peptic  . Use of leuprolide acetate (Lupron) 06/2005-11/2006   02/2008 degarelix 02/2008 x 3 doses    Patient Active Problem List   Diagnosis Date Noted  . Atrial fibrillation ([place]) 06/14/2018  . Atrial fibrillation with RVR ([place]) 06/13/2018  . PAH (pulmonary artery hypertension) ([place]) 06/13/2018  . HTN (hypertension) 06/13/2018  . Acute CHF (congestive heart failure) ([place]) 04/28/2018  . Acute on chronic diastolic CHF (congestive heart failure) ([place]) 04/28/2018  . Acute on chronic kidney failure ([place]) 07/20/2015  . HLD (hyperlipidemia) 07/20/2015  . Stroke (cerebrum) ([place]) 07/20/2015  . Elevated troponin 07/20/2015  . Diabetes mellitus without  complication ([place]) 89/38/1017  . Nonsustained paroxysmal ventricular tachycardia ([place]) 12/08/2014  . RBBB 10/22/2014  . Bradycardia 10/22/2014  . PVC's (premature ventricular contractions) 10/22/2014  . Hypertensive urgency 10/20/2014  . Syncope and collapse 10/20/2014  . Use of leuprolide acetate (Lupron)   . Hydroureteronephrosis   . Blood transfusion without reported diagnosis   . Blurry vision   . Old lacunar strokes by MRI   . Hx of Prostate cancer   . Cancer ([place])   . Abnormal PSA 01/17/2012    Past Surgical History:  Procedure Laterality Date  . CARDIOVERSION N/A 06/19/2018   Procedure: CARDIOVERSION;  Surgeon: Larey Dresser, MD;  Location: [facility] ENDOSCOPY;  Service: Cardiovascular;  Laterality: N/A;  . PROSTATE BIOPSY   12/09/03   adenocarcinoma,glerason: 3+3=6  . TEE WITHOUT CARDIOVERSION N/A 06/19/2018   Procedure: TRANSESOPHAGEAL ECHOCARDIOGRAM (TEE);  Surgeon: Larey Dresser, MD;  Location: [facility] ENDOSCOPY;  Service: Cardiovascular;  Laterality: N/A;  . VAGOTOMY  1978   partial        Home Medications    Prior to Admission medications   Medication Sig Start Date End Date Taking? Authorizing Provider  alfuzosin (UROXATRAL) 10 MG 24 hr tablet Take 10 mg by mouth daily. 03/08/18   [provider]  amiodarone (PACERONE) 200 MG tablet Take 200 mg twice a day for 10 days thereafter continue once a day only 06/22/18   Thurnell Lose, MD  apixaban (ELIQUIS) 5 MG TABS tablet Take 1 tablet (5 mg total) by mouth 2 (two) times daily. 06/22/18   Thurnell Lose, MD  atorvastatin (LIPITOR) 20 MG tablet Take 1 tablet (20 mg total) by mouth daily. 04/29/18 06/13/18  Arrien, Jimmy Picket, MD  Calcium-Vitamin D (CALTRATE 600 PLUS-VIT D PO) Take 1 tablet by mouth daily.     [provider]  furosemide (LASIX) 20 MG tablet Take 1 tablet (20 mg total) by mouth daily as needed for up to 20 days for fluid or edema (as needed in case of swelling, or weight gain 3 lbs in 24 hours or 5lbs in seven days.). Patient taking differently: Take 20 mg by mouth daily as needed for fluid or edema (as needed in case of swelling, or weight gain 3 lbs in 24 hours or 5lbs in seven days).  04/29/18 06/13/18  Arrien, Jimmy Picket, MD  gabapentin (NEURONTIN) 100 MG capsule Take 1 capsule (100 mg total) by mouth 3 (three) times daily. 04/29/18 06/13/18  Arrien, Jimmy Picket, MD  lisinopril (PRINIVIL,ZESTRIL) 20 MG tablet Take 1 tablet (20 mg total) by mouth daily. 06/24/18   Thurnell Lose, MD  metFORMIN (GLUCOPHAGE) 500 MG tablet Take 1 tablet (500 mg total) by mouth 2 (two) times daily with a meal. 04/29/18 06/13/18  Arrien, Jimmy Picket, MD  metoprolol succinate (TOPROL-XL) 50 MG 24 hr tablet Take 1 tablet (50  mg total) by mouth daily. Take with or immediately following a meal. 06/23/18   Thurnell Lose, MD  potassium chloride SA (K-DUR,KLOR-CON) 20 MEQ tablet Take 1 tablet (20 mEq total) by mouth daily as needed for up to 20 days (Take only if taking furosemide (Lasix)). Patient taking differently: Take 20 mEq by mouth daily as needed (only if taking Furosemide).  04/29/18 06/13/18  Arrien, Jimmy Picket, MD  PROAIR HFA 108 [phone] Base) MCG/ACT inhaler Inhale 2 puffs into the lungs See admin instructions. Inhale 2 puffs into lungs every 4-6 hours as needed for shortness of breath or wheezing 05/26/18   [provider]  Propylene Glycol (SYSTANE COMPLETE) 0.6 % SOLN Place 1-2 drops into both eyes 3 (three) times daily as needed (for dryness).    [provider]    Family History Family History  Problem Relation Age of Onset  . Liver cancer Father   . Kidney Stones Father   . Colon cancer Neg Hx   . Stroke Neg Hx     Social History Social History   Tobacco Use  . Smoking status: Former Smoker    Types: Pipe    Last attempt to quit: 07/05/1984    Years since quitting: 34.1  . Smokeless tobacco: Never Used  Substance Use Topics  . Alcohol use: No  . Drug use: No     Allergies   Aspirin   Review of Systems Review of Systems  Constitutional: Negative for fever.  HENT: Positive for nosebleeds. Negative for sore throat.   Eyes: Negative for visual disturbance.  Respiratory: Negative for shortness of breath.   Cardiovascular: Negative for chest pain.  Gastrointestinal: Negative for abdominal pain.  Genitourinary: Negative for dysuria.  Musculoskeletal: Negative for neck pain.  Skin: Negative for rash.  Neurological: Negative for headaches.     Physical Exam Updated Vital Signs BP 135/81 (BP Location: Left Arm)   Pulse (!) 57   Temp 97.6 F (36.4 C) (Oral)   Resp 18   Ht 5\' 11"  (1.803 m)   Wt 108.9 kg   SpO2 95%   BMI 33.47 kg/m   Physical  Exam Vitals signs and nursing note reviewed.  Constitutional:      Appearance: He is well-developed.  HENT:     Head: Normocephalic and atraumatic.     Nose: No nasal deformity or nasal tenderness.     Right Nostril: Epistaxis present. No septal hematoma.     Left Nostril: No epistaxis.     Mouth/Throat:     Mouth: Mucous membranes are moist.     Pharynx: Oropharynx is clear.  Eyes:     Conjunctiva/sclera: Conjunctivae normal.  Neck:     Musculoskeletal: Neck supple.  Cardiovascular:     Rate and Rhythm: Normal rate.     Pulses: Normal pulses.  Pulmonary:     Effort: Pulmonary effort is normal.  Skin:    General: Skin is warm and dry.     Capillary Refill: Capillary refill takes less than 2 seconds.  Neurological:     General: No focal deficit present.     Mental Status: He is alert and oriented to person, place, and time.     GCS: GCS eye subscore is 4. GCS verbal subscore is 5. GCS motor subscore is 6.     Gait: Gait normal.      ED Treatments / Results  Labs (all labs ordered are listed, but only abnormal results are displayed) Labs Reviewed - No data to display  EKG None  Radiology No results found.  Procedures .Epistaxis Management Date/Time: 08/06/2018 2:15 PM Performed by: Hayden Rasmussen, MD Authorized by: Hayden Rasmussen, MD   Consent:    Consent obtained:  Verbal   Consent given by:  Patient   Risks discussed:  Bleeding, infection, nasal injury and pain   Alternatives discussed:  No treatment, delayed treatment and referral Anesthesia (see MAR for exact dosages):    Anesthesia method:  None Procedure details:    Treatment site:  R anterior   Treatment method:  Nasal tampon   Treatment complexity:  Limited   Treatment episode:  initial   Post-procedure details:    Assessment:  Bleeding decreased   Patient tolerance of procedure:  Tolerated well, no immediate complications   (including critical care time)  Medications Ordered in  ED Medications  oxymetazoline (AFRIN) 0.05 % nasal spray 1 spray (1 spray Each Nare Given 08/06/18 1326)     Initial Impression / Assessment and Plan / ED Course  I have reviewed the triage vital signs and the nursing notes.  Pertinent labs & imaging results that were available during my care of the patient were reviewed by me and considered in my medical decision making (see chart for details).  Clinical Course as of Aug 06 1522  Sun Aug 06, 2018  1332 Patient's nosebleed is started back here prior to discharge.  The pad of blood his nose remove any clot and packed him with some Afrin cotton balls.  Will reassess.   [MB]  0258 Nosebleed appears to have stopped after the [employer].  Have him ambulate and if this comes will probably discharge soon.   [MB]    Clinical Course User Index [MB] Hayden Rasmussen, MD     Final Clinical Impressions(s) / ED Diagnoses   Final diagnoses:  Right-sided epistaxis    ED Discharge Orders         Ordered    cephALEXin (KEFLEX) 250 MG capsule  4 times daily     08/06/18 1454           Hayden Rasmussen, MD 08/06/18 1524

## 2018-08-06 NOTE — ED Triage Notes (Signed)
Pt reports he has been having frequent nosebleeds. Pt reports it has been happening every day for approximately half an hour since he was discharged from the hospital.  Pt reports he was recently placed on eliquis after being hospitalized at [facility].  Pt reports the bleeding did not start until last weekend.

## 2018-08-10 ENCOUNTER — Encounter: Payer: Self-pay | Admitting: Cardiology

## 2018-08-10 ENCOUNTER — Ambulatory Visit: Payer: Medicare Other | Admitting: Cardiology

## 2018-08-10 DIAGNOSIS — I10 Essential (primary) hypertension: Secondary | ICD-10-CM | POA: Diagnosis not present

## 2018-08-10 DIAGNOSIS — I5043 Acute on chronic combined systolic (congestive) and diastolic (congestive) heart failure: Secondary | ICD-10-CM | POA: Diagnosis not present

## 2018-08-10 DIAGNOSIS — E782 Mixed hyperlipidemia: Secondary | ICD-10-CM

## 2018-08-10 DIAGNOSIS — I480 Paroxysmal atrial fibrillation: Secondary | ICD-10-CM | POA: Diagnosis not present

## 2018-08-10 DIAGNOSIS — I340 Nonrheumatic mitral (valve) insufficiency: Secondary | ICD-10-CM | POA: Diagnosis not present

## 2018-08-10 HISTORY — DX: Nonrheumatic mitral (valve) insufficiency: I34.0

## 2018-08-10 LAB — PRO B NATRIURETIC PEPTIDE: NT-Pro BNP: 942 pg/mL — ABNORMAL HIGH (ref 0–486)

## 2018-08-10 LAB — BASIC METABOLIC PANEL
BUN/Creatinine Ratio: 19 (ref 10–24)
BUN: 25 mg/dL (ref 8–27)
CO2: 27 mmol/L (ref 20–29)
Calcium: 9.1 mg/dL (ref 8.6–10.2)
Chloride: 100 mmol/L (ref 96–106)
Creatinine, Ser: 1.35 mg/dL — ABNORMAL HIGH (ref 0.76–1.27)
GFR calc non Af Amer: 49 mL/min/{1.73_m2} — ABNORMAL LOW (ref >59)
GFR, EST AFRICAN AMERICAN: 57 mL/min/{1.73_m2} — AB (ref >59)
Glucose: 164 mg/dL — ABNORMAL HIGH (ref 65–99)
Potassium: 4.8 mmol/L (ref 3.5–5.2)
Sodium: 144 mmol/L (ref 134–144)

## 2018-08-10 NOTE — Patient Instructions (Addendum)
Medication Instructions:  Your physician recommends that you continue on your current medications as directed. Please refer to the Current Medication list given to you today.  Call our office when you get home so we can document the correct medication your taking [phone]  If you need a refill on your cardiac medications before your next appointment, please call your pharmacy.   Lab work: TODAY: BMET & BNP  If you have labs (blood work) drawn today and your tests are completely normal, you will receive your results only by: Marland Kitchen MyChart Message (if you have MyChart) OR . A paper copy in the mail If you have any lab test that is abnormal or we need to change your treatment, we will call you to review the results.  Testing/Procedures: Your physician has requested that you have an echocardiogram in March. Echocardiography is a painless test that uses sound waves to create images of your heart. It provides your doctor with information about the size and shape of your heart and how well your heart's chambers and valves are working. This procedure takes approximately one hour. There are no restrictions for this procedure.    Follow-Up: At [facility], you and your health needs are our priority.  As part of our continuing mission to provide you with exceptional heart care, we have created designated Provider Care Teams.  These Care Teams include your primary Cardiologist (physician) and Advanced Practice Providers (APPs -  Physician Assistants and Nurse Practitioners) who all work together to provide you with the care you need, when you need it. You will need a follow up appointment in:  3-4 months.  Please call our office 2 months in advance to schedule this appointment.  You may see Sherren Mocha, MDor one of the following Advanced Practice Providers on your designated Care Team: [name], PA-C [place], [place] . Daune Perch, NP  Any Other Special Instructions Will Be Listed Below  (If Applicable).   Heart Failure Action Plan A heart failure action plan helps you understand what to do when you have symptoms of heart failure. Follow the plan that was created by you and your health care provider. Review your plan each time you visit your health care provider. Red zone These signs and symptoms mean you should get medical help right away:  You have trouble breathing when resting.  You have a dry cough that is getting worse.  You have swelling or pain in your legs or abdomen that is getting worse.  You suddenly gain more than 2-3 lb (0.9-1.4 kg) in a day, or more than 5 lb (2.3 kg) in one week. This amount may be more or less depending on your condition.  You have trouble staying awake or you feel confused.  You have chest pain.  You do not have an appetite.  You pass out. If you experience any of these symptoms:  Call your local emergency services (911 in the [place] right away or seek help at the emergency department of the nearest hospital. Yellow zone These signs and symptoms mean your condition may be getting worse and you should make some changes:  You have trouble breathing when you are active or you need to sleep with extra pillows.  You have swelling in your legs or abdomen.  You gain 2-3 lb (0.9-1.4 kg) in one day, or 5 lb (2.3 kg) in one week. This amount may be more or less depending on your condition.  You get tired easily.  You  have trouble sleeping.  You have a dry cough. If you experience any of these symptoms:  Contact your health care provider within the next day.  Your health care provider may adjust your medicines. Green zone These signs mean you are doing well and can continue what you are doing:  You do not have shortness of breath.  You have very little swelling or no new swelling.  Your weight is stable (no gain or loss).  You have a normal activity level.  You do not have chest pain or any other new symptoms. Follow  these instructions at home:  Take over-the-counter and prescription medicines only as told by your health care provider.  Weigh yourself daily. Your target weight is __________ lb (__________ kg). ? Call your health care provider if you gain more than __________ lb (__________ kg) in a day, or more than __________ lb (__________ kg) in one week.  Eat a heart-healthy diet. Work with a diet and nutrition specialist (dietitian) to create an eating plan that is best for you.  Keep all follow-up visits as told by your health care provider. This is important. Where to find more information  American Heart Association: www.heart.org Summary  Follow the action plan that was created by you and your health care provider.  Get help right away if you have any symptoms in the Red zone. This information is not intended to replace advice given to you by your health care provider. Make sure you discuss any questions you have with your health care provider. Document Released: 07/31/2016 Document Revised: 02/23/2017 Document Reviewed: 07/31/2016 Elsevier Interactive Patient Education  2019 [place].   Low-Sodium Eating Plan Sodium, which is an element that makes up salt, helps you maintain a healthy balance of fluids in your body. Too much sodium can increase your blood pressure and cause fluid and waste to be held in your body. Your health care provider or dietitian may recommend following this plan if you have high blood pressure (hypertension), kidney disease, liver disease, or heart failure. Eating less sodium can help lower your blood pressure, reduce swelling, and protect your heart, liver, and kidneys. What are tips for following this plan? General guidelines  Most people on this plan should limit their sodium intake to 1,500-2,000 mg (milligrams) of sodium each day. Reading food labels   The Nutrition Facts label lists the amount of sodium in one serving of the food. If you eat more than  one serving, you must multiply the listed amount of sodium by the number of servings.  Choose foods with less than 140 mg of sodium per serving.  Avoid foods with 300 mg of sodium or more per serving. Shopping  Look for lower-sodium products, often labeled as "low-sodium" or "no salt added."  Always check the sodium content even if foods are labeled as "unsalted" or "no salt added".  Buy fresh foods. ? Avoid canned foods and premade or frozen meals. ? Avoid canned, cured, or processed meats  Buy breads that have less than 80 mg of sodium per slice. Cooking  Eat more home-cooked food and less restaurant, buffet, and fast food.  Avoid adding salt when cooking. Use salt-free seasonings or herbs instead of table salt or sea salt. Check with your health care provider or pharmacist before using salt substitutes.  Cook with plant-based oils, such as canola, sunflower, or olive oil. Meal planning  When eating at a restaurant, ask that your food be prepared with less salt or no salt,  if possible.  Avoid foods that contain MSG (monosodium glutamate). MSG is sometimes added to [place] food, bouillon, and some canned foods. What foods are recommended? The items listed may not be a complete list. Talk with your dietitian about what dietary choices are best for you. Grains Low-sodium cereals, including oats, puffed wheat and rice, and shredded wheat. Low-sodium crackers. Unsalted rice. Unsalted pasta. Low-sodium bread. Whole-grain breads and whole-grain pasta. Vegetables Fresh or frozen vegetables. "No salt added" canned vegetables. "No salt added" tomato sauce and paste. Low-sodium or reduced-sodium tomato and vegetable juice. Fruits Fresh, frozen, or canned fruit. Fruit juice. Meats and other protein foods Fresh or frozen (no salt added) meat, poultry, seafood, and fish. Low-sodium canned tuna and salmon. Unsalted nuts. Dried peas, beans, and lentils without added salt. Unsalted canned  beans. Eggs. Unsalted nut butters. Dairy Milk. Soy milk. Cheese that is naturally low in sodium, such as ricotta cheese, fresh mozzarella, or Swiss cheese Low-sodium or reduced-sodium cheese. Cream cheese. Yogurt. Fats and oils Unsalted butter. Unsalted margarine with no trans fat. Vegetable oils such as canola or olive oils. Seasonings and other foods Fresh and dried herbs and spices. Salt-free seasonings. Low-sodium mustard and ketchup. Sodium-free salad dressing. Sodium-free light mayonnaise. Fresh or refrigerated horseradish. Lemon juice. Vinegar. Homemade, reduced-sodium, or low-sodium soups. Unsalted popcorn and pretzels. Low-salt or salt-free chips. What foods are not recommended? The items listed may not be a complete list. Talk with your dietitian about what dietary choices are best for you. Grains Instant hot cereals. Bread stuffing, pancake, and biscuit mixes. Croutons. Seasoned rice or pasta mixes. Noodle soup cups. Boxed or frozen macaroni and cheese. Regular salted crackers. Self-rising flour. Vegetables Sauerkraut, pickled vegetables, and relishes. Olives. [place] fries. Onion rings. Regular canned vegetables (not low-sodium or reduced-sodium). Regular canned tomato sauce and paste (not low-sodium or reduced-sodium). Regular tomato and vegetable juice (not low-sodium or reduced-sodium). Frozen vegetables in sauces. Meats and other protein foods Meat or fish that is salted, canned, smoked, spiced, or pickled. Bacon, ham, sausage, hotdogs, corned beef, chipped beef, packaged lunch meats, salt pork, jerky, pickled herring, anchovies, regular canned tuna, sardines, salted nuts. Dairy Processed cheese and cheese spreads. Cheese curds. Blue cheese. Feta cheese. String cheese. Regular cottage cheese. Buttermilk. Canned milk. Fats and oils Salted butter. Regular margarine. Ghee. Bacon fat. Seasonings and other foods Onion salt, garlic salt, seasoned salt, table salt, and sea salt. Canned  and packaged gravies. Worcestershire sauce. Tartar sauce. Barbecue sauce. Teriyaki sauce. Soy sauce, including reduced-sodium. Steak sauce. Fish sauce. Oyster sauce. Cocktail sauce. Horseradish that you find on the shelf. Regular ketchup and mustard. Meat flavorings and tenderizers. Bouillon cubes. Hot sauce and Tabasco sauce. Premade or packaged marinades. Premade or packaged taco seasonings. Relishes. Regular salad dressings. Salsa. Potato and tortilla chips. Corn chips and puffs. Salted popcorn and pretzels. Canned or dried soups. Pizza. Frozen entrees and pot pies. Summary  Eating less sodium can help lower your blood pressure, reduce swelling, and protect your heart, liver, and kidneys.  Most people on this plan should limit their sodium intake to 1,500-2,000 mg (milligrams) of sodium each day.  Canned, boxed, and frozen foods are high in sodium. Restaurant foods, fast foods, and pizza are also very high in sodium. You also get sodium by adding salt to food.  Try to cook at home, eat more fresh fruits and vegetables, and eat less fast food, canned, processed, or prepared foods. This information is not intended to replace advice given to you by your  health care provider. Make sure you discuss any questions you have with your health care provider. Document Released: 12/11/2001 Document Revised: 06/14/2016 Document Reviewed: 06/14/2016 Elsevier Interactive Patient Education  2019 [employer].

## 2018-08-10 NOTE — Progress Notes (Signed)
Cardiology Office Note:    Date:  08/10/2018   ID:  [name], DOB January 16, 1935, MRN [id number]  PCP:  [name], MD  Cardiologist:  Sherren Mocha, MD  Referring MD: [name], MD   Chief Complaint  Patient presents with  . Hospitalization Follow-up    Atrial fibrillation and heart failure    History of Present Illness:    [name] is a 84 y.o. male with a past medical history significant for hypertension, chronic diastolic CHF, pulmonary hypertension, type 2 diabetes, prostate cancer being monitored and neurocardiogenic syncope in 2016.  He was admitted to the hospital in December with rapid atrial fibrillation.  He was treated with amiodarone and discharged home on oral dosing.  Patient underwent TEE guided cardioversion and obtain sinus rhythm.  He was started on Eliquis 5 mg twice daily for stroke risk reduction with CHA2DS2/VAS Stroke Risk Score of at least 5 (CHF, HTN, age (2), DM).  He is greater than 37 years old and creatinine was elevated during hospital stay likely related to diuresis.  Will need to check renal function for apixaban dosing.  Echocardiogram showed newly reduced EF to 20% with severe MR and severely reduced RV EF.  This was felt to be possibly tachycardia mediated with heart failure.  He was diuresed and discharged on his prior Lasix 20 mg daily as needed.  Today [name] is here alone. His HR at home has been running in the low 60's. He feels that he is getting stronger each day. He runs an air B&B. He is having no chest discomfort or shortness of breath going up and down 5 flights of stairs. Prior to hospitalization he had to rest going up one flight of stairs. This is now much better. He is having no orthopnea or PND. He has occ lightheadedness when he first stands, clears quickly, no falls.    Labs from PCP office on 07/17/18. SCR 1.41, K+ 3.9. NT ProBNP 3680. Taking meds differently from on hospital AVS. Lasix 40 mg BID and Spironolacotne 25 mg   He  does not smoke or drink alcohol.    Past Medical History:  Diagnosis Date  . A-fib ([place])    RVR-DCCV  . Abnormal PSA 01/17/2012   10.38/4/5/ PSA:2013=6.96/ PSA 07/08/2011=6.26/ PSA 2006=8.6  . 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 Blood transfusion without reported diagnosis    during vagotomy 1976  . Cancer [facility]) 2006   L-2 solitary metastatic lesion/no rad tx ,asymptomatic  . Chronic kidney disease    nephrolithiasis right kidney  . ED (erectile dysfunction)   . GERD (gastroesophageal reflux disease)    PUD S/P PARTIAL VAGOTOMY  . Gynecomastia 04/21/11   seen by Dr.Murray, no rad tx  . H/O impacted cerumen    S/P ENT DR BATES  . History of echocardiogram    echo 4/16:  mild LVH, EF 55-60%, Gr 1 DD, no RWMA, mild AI, mild MR, mod LAE, mild TR  . Hx of cardiovascular stress test    Myoview 5/16:  EF 50%, no scar or ischemia, low risk  . Hydroureteronephrosis    right   . Hyperglycemia    WITHOUT DM2  . Hyperlipidemia   . Hypertension   . Insomnia   . Migraines    WITH AURA  . Nephrolithiasis   . Osteopenia   . Overweight   . Prostate cancer ([place]) 12/09/03 dx   Prostate ca/adenocarcinoma,gleason=3+3=6    pSA 4.6  .  Prostate cancer ([place])    METASTATIC WITH L2 SCLEROTIC LESION ON LUPRON------DR. WOODRUFF  . RBBB (right bundle branch block)   . Rosacea   . Sinus bradycardia   . Sleep apnea    pt unaware  . Stroke [facility])    asymptomatic, found on head imaging  . Ulcer    peptic  . Use of leuprolide acetate (Lupron) 06/2005-11/2006   02/2008 degarelix 02/2008 x 3 doses    Past Surgical History:  Procedure Laterality Date  . CARDIOVERSION N/A 06/19/2018   Procedure: CARDIOVERSION;  Surgeon: Larey Dresser, MD;  Location[facility] ENDOSCOPY;  Service: Cardiovascular;  Laterality: N/A;  . PROSTATE BIOPSY  12/09/03   adenocarcinoma,glerason: 3+3=6  . TEE WITHOUT CARDIOVERSION N/A 06/19/2018   Procedure: TRANSESOPHAGEAL ECHOCARDIOGRAM (TEE);  Surgeon: Larey Dresser, MD;  Location[facility] ENDOSCOPY;  Service: Cardiovascular;  Laterality: N/A;  . VAGOTOMY  1978   partial    Current Medications: Current Meds  Medication Sig  . alfuzosin (UROXATRAL) 10 MG 24 hr tablet Take 10 mg by mouth daily.  Marland Kitchen amiodarone (PACERONE) 200 MG tablet Take 200 mg by mouth daily.  Marland Kitchen apixaban (ELIQUIS) 5 MG TABS tablet Take 1 tablet (5 mg total) by mouth 2 (two) times daily.  . Calcium-Vitamin D (CALTRATE 600 PLUS-VIT D PO) Take 1 tablet by mouth daily.   . cephALEXin (KEFLEX) 250 MG capsule Take 1 capsule (250 mg total) by mouth 4 (four) times daily.  . furosemide (LASIX) 20 MG tablet Take 40 mg by mouth 2 (two) times daily.  Marland Kitchen lisinopril (PRINIVIL,ZESTRIL) 20 MG tablet Take 1 tablet (20 mg total) by mouth daily.  Marland Kitchen lisinopril (PRINIVIL,ZESTRIL) 20 MG tablet Take 20 mg by mouth 2 (two) times daily.  . metoprolol succinate (TOPROL-XL) 50 MG 24 hr tablet Take 1 tablet (50 mg total) by mouth daily. Take with or immediately following a meal.  . PROAIR HFA 108 (90 Base) MCG/ACT inhaler Inhale 2 puffs into the lungs See admin instructions. Inhale 2 puffs into lungs every 4-6 hours as needed for shortness of breath or wheezing  . Propylene Glycol (SYSTANE COMPLETE) 0.6 % SOLN Place 1-2 drops into both eyes 3 (three) times daily as needed (for dryness).  Marland Kitchen spironolactone (ALDACTONE) 25 MG tablet Take 1 tablet by mouth daily.     Allergies:   Aspirin   Social History   Socioeconomic History  . Marital status: Widowed    Spouse name: Not on file  . Number of children: 2  . Years of education: Not on file  . Highest education level: Not on file  Occupational History  . 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 Financial resource strain: Not on file  . Food insecurity:    Worry: Not on file    Inability: Not on file  . Transportation needs:    Medical: Not on file    Non-medical: Not on file  Tobacco Use  . Smoking status: Former  Smoker    Types: Pipe    Last attempt to quit: 07/05/1984    Years since quitting: 34.1  . Smokeless tobacco: Never Used  Substance and Sexual Activity  . Alcohol use: No  . Drug use: No  . Sexual activity: Never  Lifestyle  . Physical activity:    Days per week: Not on file    Minutes per session: Not on file  . Stress: Not on file  Relationships  . Social connections:  Talks on phone: Not on file    Gets together: Not on file    Attends religious service: Not on file    Active member of club or organization: Not on file    Attends meetings of clubs or organizations: Not on file    Relationship status: Not on file  Other Topics Concern  . Not on file  Social History Narrative   Pt lives in [place], alone.  Widower.   Retired Animator.     Family History: The patient's family history includes Kidney Stones in his father; Liver cancer in his father. There is no history of Colon cancer or Stroke. ROS:   Please see the history of present illness.     All other systems reviewed and are negative.  EKGs/Labs/Other Studies Reviewed:    The following studies were reviewed today:  TEE 06/19/2018 Impression:  1. No LA thrombus, may proceed to DCCV.  2. Severe LV dilation with EF 25%, diffuse hypokinesis.  3. Mild RV dilation with moderate systolic dysfunction.  4. Severe MR with restriction of posterior leaflet and evidence for a ruptured chord.   Echo 04/29/2018 LV EF: 55% - 60% Study Conclusions  - Left ventricle: The cavity size was normal. Wall thickness was increased in a pattern of moderate LVH. Systolic function was normal. The estimated ejection fraction was in the range of 55% to 60%. Features are consistent with a pseudonormal left ventricular filling pattern, with concomitant abnormal relaxation and increased filling pressure (grade 2 diastolic dysfunction). - Aortic valve: There was mild regurgitation. - Mitral valve: There was  moderate regurgitation. - Left atrium: The atrium was severely dilated. - Right atrium: The atrium was mildly dilated. - Pulmonary arteries: Systolic pressure was severely increased. PA peak pressure: 75 mm Hg (S).  Echo 06/17/2018 LV EF: 20% Study Conclusions  - Left ventricle: The cavity size was severely dilated. There was mild focal basal hypertrophy of the septum. The estimated ejection fraction was 20%. Diffuse hypokinesis with regional variation. There is severe hypokinesis of the basal-midinferior myocardium. Indeterminate diastolic function. - Aortic valve: Mildly calcified annulus. Trileaflet; mildly calcified leaflets. There was mild regurgitation. Valve area (Vmax): 1.88 cm^2. - Mitral valve: Mildly calcified annulus. Mildly thickened leaflets. There was severe regurgitation. Regurgitant volume (PISA): 104 ml. - Left atrium: The atrium was mildly dilated. - Right ventricle: Systolic function was moderately to severely reduced. - Right atrium: The atrium was mildly dilated. Central venous pressure (est): 15 mm Hg. - Tricuspid valve: There was moderate regurgitation. - Pulmonary arteries: Systolic pressure was moderately to severely increased. PA peak pressure: 58 mm Hg (S). - Pericardium, extracardiac: There was no pericardial effusion.   EKG:  EKG is ordered today.  The ekg ordered today demonstrates NSR with LAD, RBBB, Q waves inferiorly.  Recent Labs: 04/28/2018: Magnesium 2.3 06/13/2018: ALT 42; B Natriuretic Peptide 336.7 06/16/2018: TSH 1.859 06/22/2018: Hemoglobin 12.3; Platelets 178 08/10/2018: BUN 25; Creatinine, Ser 1.35; NT-Pro BNP 942; Potassium 4.8; Sodium 144   Recent Lipid Panel    Component Value Date/Time   CHOL 118 07/20/2015 0529   TRIG 86 07/20/2015 0529   HDL 35 (L) 07/20/2015 0529   CHOLHDL 3.4 07/20/2015 0529   VLDL 17 07/20/2015 0529   LDLCALC 66 07/20/2015 0529    Physical Exam:    VS:  BP 136/76    Pulse 60   Ht 5\' 11"  (1.803 m)   Wt 241 lb 12.8 oz (109.7 kg)   SpO2 94%  BMI 33.72 kg/m     Wt Readings from Last 3 Encounters:  08/10/18 241 lb 12.8 oz (109.7 kg)  08/06/18 240 lb (108.9 kg)  06/21/18 263 lb 6.4 oz (119.5 kg)     Physical Exam  Constitutional: He is oriented to person, place, and time. He appears well-developed and well-nourished. No distress.  HENT:  Head: Normocephalic and atraumatic.  Neck: Normal range of motion. Neck supple. No JVD present.  Cardiovascular: Normal rate, regular rhythm, normal heart sounds and intact distal pulses. Exam reveals no gallop and no friction rub.  No murmur heard. Pulmonary/Chest: Effort normal and breath sounds normal. No respiratory distress. He has no wheezes. He has no rales.  Abdominal: Soft. Bowel sounds are normal.  Musculoskeletal: Normal range of motion.        General: No deformity or edema.  Neurological: He is alert and oriented to person, place, and time.  Skin: Skin is warm and dry.  Psychiatric: He has a normal mood and affect. His behavior is normal. Judgment and thought content normal.  Vitals reviewed.   ASSESSMENT:    1. Paroxysmal atrial fibrillation (HCC)   2. Essential hypertension   3. Acute on chronic combined systolic (congestive) and diastolic (congestive) heart failure ([place])   4. Mitral valve insufficiency, unspecified etiology   5. Mixed hyperlipidemia    PLAN:    In order of problems listed above:  1.  Atrial fibrillation -Newly diagnosed in December, status post cardioversion to sinus rhythm.  Maintaining sinus rhythm on amiodarone, beta-blocker. -on Eliquis 5 mg twice daily for stroke risk reduction with CHA2DS2/VAS Stroke Risk Score of at least 5 (CHF, HTN, age (2), DM).  Patient had altered renal function in the hospital.  Will check metabolic panel today and adjust Eliquis dose if needed. He had a nose bleed last month that had to be treated with packing in the ED. No further epistaxis.     2.  Hypertension -On lisinopril 20 mg BID, metoprolol succinate 50 mg and is on lasix and spironolactone.   3.  Acute on chronic combined heart failure -Found to have newly reduced EF to 20% felt to be related to tachyarrhythmia. -Treated with metoprolol succinate, ACE inhibitor.  -Has been taking lasix 40 mg BID and spironolactone which were not on his discharge list, apparently started by his PCP due to excess volume.  -No signs symptoms or fluid overload today, looks very well.  He is quite active without any exertional dyspnea. -Will check BMet today -If labs stable will consider reducing lasix to once daily.  -Will recheck echo next month since maintaining sinus rhythm and volume status looks good. Hopefully will have improved.  -Watch potassium since patient on lisinopril 20 mg twice daily as well as spironolactone.  May consider decreasing lisinopril to once daily if we reduce Lasix.  4.  Mitral regurgitation -Had been moderate on prior echo, noted to be severe in the hospital likely related to volume overload. -We will repeat echo in March since maintaining sinus rhythm  5.  Dyslipidemia -On atorvastatin 20 mg daily   Medication Adjustments/Labs and Tests Ordered: Current medicines are reviewed at length with the patient today.  Concerns regarding medicines are outlined above. Labs and tests ordered and medication changes are outlined in the patient instructions below:  Patient Instructions  Medication Instructions:  Your physician recommends that you continue on your current medications as directed. Please refer to the Current Medication list given to you today.  Call our office  when you get home so we can document the correct medication your taking [phone]  If you need a refill on your cardiac medications before your next appointment, please call your pharmacy.   Lab work: TODAY: BMET & BNP  If you have labs (blood work) drawn today and your tests are completely  normal, you will receive your results only by: Marland Kitchen MyChart Message (if you have MyChart) OR . A paper copy in the mail If you have any lab test that is abnormal or we need to change your treatment, we will call you to review the results.  Testing/Procedures: Your physician has requested that you have an echocardiogram in March. Echocardiography is a painless test that uses sound waves to create images of your heart. It provides your doctor with information about the size and shape of your heart and how well your heart's chambers and valves are working. This procedure takes approximately one hour. There are no restrictions for this procedure.    Follow-Up: At [facility], you and your health needs are our priority.  As part of our continuing mission to provide you with exceptional heart care, we have created designated Provider Care Teams.  These Care Teams include your primary Cardiologist (physician) and Advanced Practice Providers (APPs -  Physician Assistants and Nurse Practitioners) who all work together to provide you with the care you need, when you need it. You will need a follow up appointment in:  3-4 months.  Please call our office 2 months in advance to schedule this appointment.  You may see Sherren Mocha, MDor one of the following Advanced Practice Providers on your designated Care Team: [name], PA-C [place], [place] . Daune Perch, NP  Any Other Special Instructions Will Be Listed Below (If Applicable).   Heart Failure Action Plan A heart failure action plan helps you understand what to do when you have symptoms of heart failure. Follow the plan that was created by you and your health care provider. Review your plan each time you visit your health care provider. Red zone These signs and symptoms mean you should get medical help right away:  You have trouble breathing when resting.  You have a dry cough that is getting worse.  You have swelling or pain in your legs  or abdomen that is getting worse.  You suddenly gain more than 2-3 lb (0.9-1.4 kg) in a day, or more than 5 lb (2.3 kg) in one week. This amount may be more or less depending on your condition.  You have trouble staying awake or you feel confused.  You have chest pain.  You do not have an appetite.  You pass out. If you experience any of these symptoms:  Call your local emergency services (911 in the [place] right away or seek help at the emergency department of the nearest hospital. Yellow zone These signs and symptoms mean your condition may be getting worse and you should make some changes:  You have trouble breathing when you are active or you need to sleep with extra pillows.  You have swelling in your legs or abdomen.  You gain 2-3 lb (0.9-1.4 kg) in one day, or 5 lb (2.3 kg) in one week. This amount may be more or less depending on your condition.  You get tired easily.  You have trouble sleeping.  You have a dry cough. If you experience any of these symptoms:  Contact your health care provider within the next day.  Your health  care provider may adjust your medicines. Green zone These signs mean you are doing well and can continue what you are doing:  You do not have shortness of breath.  You have very little swelling or no new swelling.  Your weight is stable (no gain or loss).  You have a normal activity level.  You do not have chest pain or any other new symptoms. Follow these instructions at home:  Take over-the-counter and prescription medicines only as told by your health care provider.  Weigh yourself daily. Your target weight is __________ lb (__________ kg). ? Call your health care provider if you gain more than __________ lb (__________ kg) in a day, or more than __________ lb (__________ kg) in one week.  Eat a heart-healthy diet. Work with a diet and nutrition specialist (dietitian) to create an eating plan that is best for you.  Keep all  follow-up visits as told by your health care provider. This is important. Where to find more information  American Heart Association: www.heart.org Summary  Follow the action plan that was created by you and your health care provider.  Get help right away if you have any symptoms in the Red zone. This information is not intended to replace advice given to you by your health care provider. Make sure you discuss any questions you have with your health care provider. Document Released: 07/31/2016 Document Revised: 02/23/2017 Document Reviewed: 07/31/2016 Elsevier Interactive Patient Education  2019 [place].   Low-Sodium Eating Plan Sodium, which is an element that makes up salt, helps you maintain a healthy balance of fluids in your body. Too much sodium can increase your blood pressure and cause fluid and waste to be held in your body. Your health care provider or dietitian may recommend following this plan if you have high blood pressure (hypertension), kidney disease, liver disease, or heart failure. Eating less sodium can help lower your blood pressure, reduce swelling, and protect your heart, liver, and kidneys. What are tips for following this plan? General guidelines  Most people on this plan should limit their sodium intake to 1,500-2,000 mg (milligrams) of sodium each day. Reading food labels   The Nutrition Facts label lists the amount of sodium in one serving of the food. If you eat more than one serving, you must multiply the listed amount of sodium by the number of servings.  Choose foods with less than 140 mg of sodium per serving.  Avoid foods with 300 mg of sodium or more per serving. Shopping  Look for lower-sodium products, often labeled as "low-sodium" or "no salt added."  Always check the sodium content even if foods are labeled as "unsalted" or "no salt added".  Buy fresh foods. ? Avoid canned foods and premade or frozen meals. ? Avoid canned, cured, or  processed meats  Buy breads that have less than 80 mg of sodium per slice. Cooking  Eat more home-cooked food and less restaurant, buffet, and fast food.  Avoid adding salt when cooking. Use salt-free seasonings or herbs instead of table salt or sea salt. Check with your health care provider or pharmacist before using salt substitutes.  Cook with plant-based oils, such as canola, sunflower, or olive oil. Meal planning  When eating at a restaurant, ask that your food be prepared with less salt or no salt, if possible.  Avoid foods that contain MSG (monosodium glutamate). MSG is sometimes added to [place] food, bouillon, and some canned foods. What foods are recommended? The items listed  may not be a complete list. Talk with your dietitian about what dietary choices are best for you. Grains Low-sodium cereals, including oats, puffed wheat and rice, and shredded wheat. Low-sodium crackers. Unsalted rice. Unsalted pasta. Low-sodium bread. Whole-grain breads and whole-grain pasta. Vegetables Fresh or frozen vegetables. "No salt added" canned vegetables. "No salt added" tomato sauce and paste. Low-sodium or reduced-sodium tomato and vegetable juice. Fruits Fresh, frozen, or canned fruit. Fruit juice. Meats and other protein foods Fresh or frozen (no salt added) meat, poultry, seafood, and fish. Low-sodium canned tuna and salmon. Unsalted nuts. Dried peas, beans, and lentils without added salt. Unsalted canned beans. Eggs. Unsalted nut butters. Dairy Milk. Soy milk. Cheese that is naturally low in sodium, such as ricotta cheese, fresh mozzarella, or Swiss cheese Low-sodium or reduced-sodium cheese. Cream cheese. Yogurt. Fats and oils Unsalted butter. Unsalted margarine with no trans fat. Vegetable oils such as canola or olive oils. Seasonings and other foods Fresh and dried herbs and spices. Salt-free seasonings. Low-sodium mustard and ketchup. Sodium-free salad dressing. Sodium-free light  mayonnaise. Fresh or refrigerated horseradish. Lemon juice. Vinegar. Homemade, reduced-sodium, or low-sodium soups. Unsalted popcorn and pretzels. Low-salt or salt-free chips. What foods are not recommended? The items listed may not be a complete list. Talk with your dietitian about what dietary choices are best for you. Grains Instant hot cereals. Bread stuffing, pancake, and biscuit mixes. Croutons. Seasoned rice or pasta mixes. Noodle soup cups. Boxed or frozen macaroni and cheese. Regular salted crackers. Self-rising flour. Vegetables Sauerkraut, pickled vegetables, and relishes. Olives. [place] fries. Onion rings. Regular canned vegetables (not low-sodium or reduced-sodium). Regular canned tomato sauce and paste (not low-sodium or reduced-sodium). Regular tomato and vegetable juice (not low-sodium or reduced-sodium). Frozen vegetables in sauces. Meats and other protein foods Meat or fish that is salted, canned, smoked, spiced, or pickled. Bacon, ham, sausage, hotdogs, corned beef, chipped beef, packaged lunch meats, salt pork, jerky, pickled herring, anchovies, regular canned tuna, sardines, salted nuts. Dairy Processed cheese and cheese spreads. Cheese curds. Blue cheese. Feta cheese. String cheese. Regular cottage cheese. Buttermilk. Canned milk. Fats and oils Salted butter. Regular margarine. Ghee. Bacon fat. Seasonings and other foods Onion salt, garlic salt, seasoned salt, table salt, and sea salt. Canned and packaged gravies. Worcestershire sauce. Tartar sauce. Barbecue sauce. Teriyaki sauce. Soy sauce, including reduced-sodium. Steak sauce. Fish sauce. Oyster sauce. Cocktail sauce. Horseradish that you find on the shelf. Regular ketchup and mustard. Meat flavorings and tenderizers. Bouillon cubes. Hot sauce and Tabasco sauce. Premade or packaged marinades. Premade or packaged taco seasonings. Relishes. Regular salad dressings. Salsa. Potato and tortilla chips. Corn chips and puffs. Salted  popcorn and pretzels. Canned or dried soups. Pizza. Frozen entrees and pot pies. Summary  Eating less sodium can help lower your blood pressure, reduce swelling, and protect your heart, liver, and kidneys.  Most people on this plan should limit their sodium intake to 1,500-2,000 mg (milligrams) of sodium each day.  Canned, boxed, and frozen foods are high in sodium. Restaurant foods, fast foods, and pizza are also very high in sodium. You also get sodium by adding salt to food.  Try to cook at home, eat more fresh fruits and vegetables, and eat less fast food, canned, processed, or prepared foods. This information is not intended to replace advice given to you by your health care provider. Make sure you discuss any questions you have with your health care provider. Document Released: 12/11/2001 Document Revised: 06/14/2016 Document Reviewed: 06/14/2016 Elsevier Interactive Patient Education  [address] [place], Daune Perch, NP  08/10/2018 [facility]

## 2018-08-15 ENCOUNTER — Telehealth: Payer: Self-pay

## 2018-08-15 MED ORDER — FUROSEMIDE 20 MG PO TABS: 40.0000 mg | ORAL_TABLET | Freq: Every day | ORAL | 5 refills | Status: DC

## 2018-08-15 NOTE — Telephone Encounter (Signed)
Per Pecolia Ades NP,          Labs shows that fluid status is much better. I think that since he is doing better with heart back in normal rhyhtm, we can reduce his lasix to 40 mg once daily instead of twice. If he develops swelling increased shortness of breath he can take a second dose for that day.    Spoke with pt. Pt verbalized understanding

## 2018-08-18 ENCOUNTER — Telehealth: Payer: Self-pay

## 2018-08-18 MED ORDER — FUROSEMIDE 40 MG PO TABS: 40.0000 mg | ORAL_TABLET | Freq: Every day | ORAL | 3 refills | Status: DC

## 2018-08-18 NOTE — Telephone Encounter (Signed)
Orders

## 2018-08-22 ENCOUNTER — Emergency Department (HOSPITAL_COMMUNITY)
Admission: EM | Admit: 2018-08-22 | Discharge: 2018-08-22 | Disposition: A | Discharge status: Home / Self Care | Payer: Medicare Other | Attending: Emergency Medicine | Admitting: Emergency Medicine

## 2018-08-22 ENCOUNTER — Encounter (HOSPITAL_COMMUNITY): Payer: Self-pay

## 2018-08-22 DIAGNOSIS — R040 Epistaxis: Secondary | ICD-10-CM | POA: Insufficient documentation

## 2018-08-22 DIAGNOSIS — E1122 Type 2 diabetes mellitus with diabetic chronic kidney disease: Secondary | ICD-10-CM | POA: Diagnosis not present

## 2018-08-22 DIAGNOSIS — Z7901 Long term (current) use of anticoagulants: Secondary | ICD-10-CM | POA: Diagnosis not present

## 2018-08-22 DIAGNOSIS — Z7984 Long term (current) use of oral hypoglycemic drugs: Secondary | ICD-10-CM | POA: Diagnosis not present

## 2018-08-22 DIAGNOSIS — Z87891 Personal history of nicotine dependence: Secondary | ICD-10-CM | POA: Diagnosis not present

## 2018-08-22 DIAGNOSIS — Z8673 Personal history of transient ischemic attack (TIA), and cerebral infarction without residual deficits: Secondary | ICD-10-CM | POA: Diagnosis not present

## 2018-08-22 DIAGNOSIS — Z8546 Personal history of malignant neoplasm of prostate: Secondary | ICD-10-CM | POA: Insufficient documentation

## 2018-08-22 DIAGNOSIS — N189 Chronic kidney disease, unspecified: Secondary | ICD-10-CM | POA: Insufficient documentation

## 2018-08-22 DIAGNOSIS — Z79899 Other long term (current) drug therapy: Secondary | ICD-10-CM | POA: Diagnosis not present

## 2018-08-22 DIAGNOSIS — I129 Hypertensive chronic kidney disease with stage 1 through stage 4 chronic kidney disease, or unspecified chronic kidney disease: Secondary | ICD-10-CM | POA: Insufficient documentation

## 2018-08-22 LAB — CBC
HCT: 39.3 % (ref 39.0–52.0)
Hemoglobin: 12.1 g/dL — ABNORMAL LOW (ref 13.0–17.0)
MCH: 27.4 pg (ref 26.0–34.0)
MCHC: 30.8 g/dL (ref 30.0–36.0)
MCV: 88.9 fL (ref 80.0–100.0)
Platelets: 188 10*3/uL (ref 150–400)
RBC: 4.42 MIL/uL (ref 4.22–5.81)
RDW: 16 % — ABNORMAL HIGH (ref 11.5–15.5)
WBC: 6.3 10*3/uL (ref 4.0–10.5)
nRBC: 0 % (ref 0.0–0.2)

## 2018-08-22 MED ORDER — OXYMETAZOLINE HCL 0.05 % NA SOLN
1.0000 | Freq: Once | NASAL | Status: AC
  Administered 2018-08-22: 1 via NASAL
  Filled 2018-08-22: qty 30

## 2018-08-22 NOTE — ED Triage Notes (Signed)
Pt presents for evaluation of ongoing nosebleeds. On elliquis for afib. Bleeding is controlled at this time.

## 2018-08-22 NOTE — ED Provider Notes (Signed)
84 year old male here with epistaxis.  Patient is currently on Eliquis for A. fib, he has slow active bleed in triage.  Afrin and gauze was placed help stabilize until patient can be seen in acute bed.  Vitals:   08/22/18 1419  BP: (!) 165/109  Pulse: 72  Resp: 18  Temp: (!) 97.4 F (36.3 C)  SpO2: 93%     Okey Regal, PA-C 08/22/18 1436    Julianne Rice, MD 08/23/18 [phone]

## 2018-08-22 NOTE — ED Notes (Signed)
Pt given discharge instructions. Pt given follow up information. Pt given the opportunity to ask questions. Pt verbalized understanding.

## 2018-08-22 NOTE — ED Provider Notes (Signed)
[facility] EMERGENCY DEPARTMENT Provider Note   CSN: [id number] Arrival date & time: 08/22/18  1415    History   Chief Complaint Chief Complaint  Patient presents with  . Epistaxis    HPI [name] is a 84 y.o. male.     HPI Patient reports he had bleeding from his right nare.  He got started on Eliquis several weeks ago.  He was having periodic bleeding and had to be seen in the emergency department about 2 weeks ago with a nasal packing.  He removed the packing after a day.  He followed up with ENT and they did not see any bleeding source at that time.  He reports this time he has been trying to hold pressure and use tissues but it has continued to bleed and bleed rather heavily. Past Medical History:  Diagnosis Date  . A-fib ([place])    RVR-DCCV  . Abnormal PSA 01/17/2012   10.38/4/5/ PSA:2013=6.96/ PSA 07/08/2011=6.26/ PSA 2006=8.6  . 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 Blood transfusion without reported diagnosis    during vagotomy 1976  . Cancer [facility]) 2006   L-2 solitary metastatic lesion/no rad tx ,asymptomatic  . Chronic kidney disease    nephrolithiasis right kidney  . ED (erectile dysfunction)   . GERD (gastroesophageal reflux disease)    PUD S/P PARTIAL VAGOTOMY  . Gynecomastia 04/21/11   seen by Dr.Murray, no rad tx  . H/O impacted cerumen    S/P ENT DR BATES  . History of echocardiogram    echo 4/16:  mild LVH, EF 55-60%, Gr 1 DD, no RWMA, mild AI, mild MR, mod LAE, mild TR  . Hx of cardiovascular stress test    Myoview 5/16:  EF 50%, no scar or ischemia, low risk  . Hydroureteronephrosis    right   . Hyperglycemia    WITHOUT DM2  . Hyperlipidemia   . Hypertension   . Insomnia   . Migraines    WITH AURA  . Nephrolithiasis   . Osteopenia   . Overweight   . Prostate cancer ([place]) 12/09/03 dx   Prostate ca/adenocarcinoma,gleason=3+3=6    pSA 4.6  . Prostate cancer ([place])    METASTATIC WITH L2 SCLEROTIC LESION ON  LUPRON------DR. WOODRUFF  . RBBB (right bundle branch block)   . Rosacea   . Sinus bradycardia   . Sleep apnea    pt unaware  . Stroke [facility])    asymptomatic, found on head imaging  . Ulcer    peptic  . Use of leuprolide acetate (Lupron) 06/2005-11/2006   02/2008 degarelix 02/2008 x 3 doses    Patient Active Problem List   Diagnosis Date Noted  . Acute on chronic combined systolic (congestive) and diastolic (congestive) heart failure ([place]) 08/10/2018  . Mitral regurgitation 08/10/2018  . Atrial fibrillation ([place]) 06/14/2018  . Atrial fibrillation with RVR ([place]) 06/13/2018  . PAH (pulmonary artery hypertension) ([place]) 06/13/2018  . HTN (hypertension) 06/13/2018  . Acute CHF (congestive heart failure) ([place]) 04/28/2018  . Acute on chronic diastolic CHF (congestive heart failure) ([place]) 04/28/2018  . Acute on chronic kidney failure ([place]) 07/20/2015  . HLD (hyperlipidemia) 07/20/2015  . Stroke (cerebrum) ([place]) 07/20/2015  . Elevated troponin 07/20/2015  . Diabetes mellitus without complication ([place]) 32/44/0102  . Nonsustained paroxysmal ventricular tachycardia ([place]) 12/08/2014  . RBBB 10/22/2014  . Bradycardia 10/22/2014  . PVC's (premature ventricular contractions) 10/22/2014  . Hypertensive urgency 10/20/2014  . Syncope and  collapse 10/20/2014  . Use of leuprolide acetate (Lupron)   . Hydroureteronephrosis   . Blood transfusion without reported diagnosis   . Blurry vision   . Old lacunar strokes by MRI   . Hx of Prostate cancer   . Cancer ([place])   . Abnormal PSA 01/17/2012    Past Surgical History:  Procedure Laterality Date  . CARDIOVERSION N/A 06/19/2018   Procedure: CARDIOVERSION;  Surgeon: Larey Dresser, MD;  Location: [facility] ENDOSCOPY;  Service: Cardiovascular;  Laterality: N/A;  . PROSTATE BIOPSY  12/09/03   adenocarcinoma,glerason: 3+3=6  . TEE WITHOUT CARDIOVERSION N/A 06/19/2018   Procedure: TRANSESOPHAGEAL ECHOCARDIOGRAM (TEE);  Surgeon: Larey Dresser, MD;   Location: [facility] ENDOSCOPY;  Service: Cardiovascular;  Laterality: N/A;  . VAGOTOMY  1978   partial        Home Medications    Prior to Admission medications   Medication Sig Start Date End Date Taking? Authorizing Provider  alfuzosin (UROXATRAL) 10 MG 24 hr tablet Take 10 mg by mouth daily. 03/08/18   [provider]  amiodarone (PACERONE) 200 MG tablet Take 200 mg by mouth daily.    [provider]  apixaban (ELIQUIS) 5 MG TABS tablet Take 1 tablet (5 mg total) by mouth 2 (two) times daily. 06/22/18   Thurnell Lose, MD  atorvastatin (LIPITOR) 20 MG tablet Take 1 tablet (20 mg total) by mouth daily. 04/29/18 06/13/18  Arrien, Jimmy Picket, MD  Calcium-Vitamin D (CALTRATE 600 PLUS-VIT D PO) Take 1 tablet by mouth daily.     [provider]  cephALEXin (KEFLEX) 250 MG capsule Take 1 capsule (250 mg total) by mouth 4 (four) times daily. 08/06/18   Hayden Rasmussen, MD  furosemide (LASIX) 40 MG tablet Take 1 tablet (40 mg total) by mouth daily. You may take an extra tablet as needed for shortness of breath and swelling 08/18/18   Daune Perch, NP  gabapentin (NEURONTIN) 100 MG capsule Take 1 capsule (100 mg total) by mouth 3 (three) times daily. 04/29/18 06/13/18  Arrien, Jimmy Picket, MD  lisinopril (PRINIVIL,ZESTRIL) 20 MG tablet Take 1 tablet (20 mg total) by mouth daily. 06/24/18   Thurnell Lose, MD  lisinopril (PRINIVIL,ZESTRIL) 20 MG tablet Take 20 mg by mouth 2 (two) times daily.    [provider]  metFORMIN (GLUCOPHAGE) 500 MG tablet Take 1 tablet (500 mg total) by mouth 2 (two) times daily with a meal. 04/29/18 06/13/18  Arrien, Jimmy Picket, MD  metoprolol succinate (TOPROL-XL) 50 MG 24 hr tablet Take 1 tablet (50 mg total) by mouth daily. Take with or immediately following a meal. 06/23/18   Thurnell Lose, MD  potassium chloride SA (K-DUR,KLOR-CON) 20 MEQ tablet Take 1 tablet (20 mEq total) by mouth daily as needed for up to 20 days  (Take only if taking furosemide (Lasix)). Patient taking differently: Take 20 mEq by mouth daily as needed (only if taking Furosemide).  04/29/18 06/13/18  Arrien, Jimmy Picket, MD  PROAIR HFA 108 [phone] Base) MCG/ACT inhaler Inhale 2 puffs into the lungs See admin instructions. Inhale 2 puffs into lungs every 4-6 hours as needed for shortness of breath or wheezing 05/26/18   [provider]  Propylene Glycol (SYSTANE COMPLETE) 0.6 % SOLN Place 1-2 drops into both eyes 3 (three) times daily as needed (for dryness).    [provider]  spironolactone (ALDACTONE) 25 MG tablet Take 1 tablet by mouth daily. 07/03/18   [provider]    Family  History Family History  Problem Relation Age of Onset  . Liver cancer Father   . Kidney Stones Father   . Colon cancer Neg Hx   . Stroke Neg Hx     Social History Social History   Tobacco Use  . Smoking status: Former Smoker    Types: Pipe    Last attempt to quit: 07/05/1984    Years since quitting: 34.1  . Smokeless tobacco: Never Used  Substance Use Topics  . Alcohol use: No  . Drug use: No     Allergies   Aspirin   Review of Systems Review of Systems 10 Systems reviewed and are negative for acute change except as noted in the HPI.   Physical Exam Updated Vital Signs BP (!) 167/92 (BP Location: Left Arm)   Pulse 64   Temp (!) 97.4 F (36.3 C) (Oral)   Resp 18   SpO2 96%   Physical Exam Constitutional:      Comments: Patient is alert and nontoxic.  No respiratory distress.  Clinically well in appearance.  HENT:     Head:     Comments: Right nare has a blood saturated gauze pledget in it.  No active bleeding at this time.  Just fine trace of blood tinged mucus in the posterior oropharynx.  No dangling clot.  Once pledget removed, nasal passages clear without residual clot.  There is a focus on Kiesselbach plexus with small pulsatile bleeding.    Mouth/Throat:     Mouth: Mucous membranes are moist.    Eyes:     Extraocular Movements: Extraocular movements intact.  Pulmonary:     Breath sounds: Normal breath sounds.  Musculoskeletal: Normal range of motion.  Skin:    General: Skin is warm and dry.  Neurological:     General: No focal deficit present.     Mental Status: He is oriented to person, place, and time.     Coordination: Coordination normal.     Comments: Speech and cognitive function normal.  Mental status clear.  Psychiatric:        Mood and Affect: Mood normal.      ED Treatments / Results  Labs (all labs ordered are listed, but only abnormal results are displayed) Labs Reviewed  CBC - Abnormal; Notable for the following components:      Result Value   Hemoglobin 12.1 (*)    RDW 16.0 (*)    All other components within normal limits    EKG None  Radiology No results found.  Procedures .Epistaxis Management Date/Time: 08/22/2018 5:41 PM Performed by: Charlesetta Shanks, MD Authorized by: Charlesetta Shanks, MD   Consent:    Consent obtained:  Verbal   Consent given by:  Patient   Risks discussed:  Bleeding, infection, nasal injury and pain   Alternatives discussed:  No treatment and delayed treatment Anesthesia (see MAR for exact dosages):    Anesthesia method:  None Procedure details:    Treatment site:  R anterior   Treatment method:  Silver nitrate and merocel sponge   Treatment complexity:  Extensive   Treatment episode: initial   Post-procedure details:    Assessment:  Bleeding stopped   Patient tolerance of procedure:  Tolerated well, no immediate complications Comments:      clot nare was cleared of all clot was good breathing through the passage.  There was a visible focus of punctate, pulsatile bleeding on the Kiesselbach plexus.  Silver nitrate applied.  There was still slight bleeding, hemostatic  wound powder used.  This resolved bleeding.  I observed the patient for 15 minutes with no bleeding through or around the quick clot.  Then 2 short  Merocel sponges placed in the anterior nose and expanded for compression on the area.  No rebleeding.  Patient tolerated well.   (including critical care time)  Medications Ordered in ED Medications  oxymetazoline (AFRIN) 0.05 % nasal spray 1 spray (1 spray Each Nare Given 08/22/18 1428)     Initial Impression / Assessment and Plan / ED Course  I have reviewed the triage vital signs and the nursing notes.  Pertinent labs & imaging results that were available during my care of the patient were reviewed by me and considered in my medical decision making (see chart for details).       Patient has had episodes of recurrent bleeding.  He is on Eliquis started recently over the past several weeks for atrial fibrillation.  Bleeding was anterior.  Management as outlined in procedure note.  Bleeding stopped and controlled.  Patient will follow-up with ENT.  He is advised to leave packing in place for 2 to 3 days.  Return precautions reviewed.  Final Clinical Impressions(s) / ED Diagnoses   Final diagnoses:  Right-sided epistaxis    ED Discharge Orders    None       Charlesetta Shanks, MD 08/22/18 [phone]

## 2018-08-22 NOTE — Discharge Instructions (Signed)
1.  Leave your packing in place for 2 to 3 days.  If you are having bleeding around or behind the packing, hold firm pressure on the nose with the packing in place for 5 minutes.  If bleeding does not stop, return to the emergency department. 2.  Make an appointment to follow-up with Dr. Blenda Nicely of ear nose throat this week.  Call tomorrow morning to schedule a recheck to have your packing removed. 3.  You can let your ear nose throat provider note that when you were seen in the emergency department, there was a punctate focus of brisk bleeding from Kiesselbach plexus on the right.

## 2018-09-07 ENCOUNTER — Ambulatory Visit (HOSPITAL_COMMUNITY): Payer: Medicare Other | Attending: Cardiovascular Disease

## 2018-09-07 DIAGNOSIS — I5043 Acute on chronic combined systolic (congestive) and diastolic (congestive) heart failure: Secondary | ICD-10-CM | POA: Diagnosis not present

## 2018-10-30 ENCOUNTER — Emergency Department (HOSPITAL_COMMUNITY): Payer: Medicare Other

## 2018-10-30 ENCOUNTER — Encounter (HOSPITAL_COMMUNITY): Payer: Self-pay | Admitting: Pharmacy Technician

## 2018-10-30 ENCOUNTER — Observation Stay (HOSPITAL_COMMUNITY)
Admission: EM | Admit: 2018-10-30 | Discharge: 2018-10-31 | Disposition: A | Discharge status: Home / Self Care | Payer: Medicare Other | Attending: Internal Medicine | Admitting: Internal Medicine

## 2018-10-30 ENCOUNTER — Other Ambulatory Visit: Payer: Self-pay

## 2018-10-30 DIAGNOSIS — I480 Paroxysmal atrial fibrillation: Secondary | ICD-10-CM

## 2018-10-30 DIAGNOSIS — I130 Hypertensive heart and chronic kidney disease with heart failure and stage 1 through stage 4 chronic kidney disease, or unspecified chronic kidney disease: Secondary | ICD-10-CM | POA: Insufficient documentation

## 2018-10-30 DIAGNOSIS — Z8673 Personal history of transient ischemic attack (TIA), and cerebral infarction without residual deficits: Secondary | ICD-10-CM | POA: Insufficient documentation

## 2018-10-30 DIAGNOSIS — E785 Hyperlipidemia, unspecified: Secondary | ICD-10-CM | POA: Diagnosis not present

## 2018-10-30 DIAGNOSIS — Z7901 Long term (current) use of anticoagulants: Secondary | ICD-10-CM | POA: Insufficient documentation

## 2018-10-30 DIAGNOSIS — I4891 Unspecified atrial fibrillation: Secondary | ICD-10-CM | POA: Diagnosis not present

## 2018-10-30 DIAGNOSIS — Z87891 Personal history of nicotine dependence: Secondary | ICD-10-CM | POA: Diagnosis not present

## 2018-10-30 DIAGNOSIS — N183 Chronic kidney disease, stage 3 unspecified: Secondary | ICD-10-CM | POA: Diagnosis present

## 2018-10-30 DIAGNOSIS — R55 Syncope and collapse: Secondary | ICD-10-CM | POA: Diagnosis present

## 2018-10-30 DIAGNOSIS — N179 Acute kidney failure, unspecified: Secondary | ICD-10-CM | POA: Diagnosis present

## 2018-10-30 DIAGNOSIS — Z79899 Other long term (current) drug therapy: Secondary | ICD-10-CM | POA: Insufficient documentation

## 2018-10-30 DIAGNOSIS — I639 Cerebral infarction, unspecified: Secondary | ICD-10-CM | POA: Diagnosis present

## 2018-10-30 DIAGNOSIS — I5022 Chronic systolic (congestive) heart failure: Secondary | ICD-10-CM | POA: Diagnosis present

## 2018-10-30 DIAGNOSIS — I10 Essential (primary) hypertension: Secondary | ICD-10-CM | POA: Diagnosis present

## 2018-10-30 DIAGNOSIS — Z8546 Personal history of malignant neoplasm of prostate: Secondary | ICD-10-CM | POA: Diagnosis not present

## 2018-10-30 LAB — CBC WITH DIFFERENTIAL/PLATELET
Abs Immature Granulocytes: 0.03 10*3/uL (ref 0.00–0.07)
Basophils Absolute: 0 10*3/uL (ref 0.0–0.1)
Basophils Relative: 0 %
Eosinophils Absolute: 0.1 10*3/uL (ref 0.0–0.5)
Eosinophils Relative: 1 %
HCT: 39.1 % (ref 39.0–52.0)
Hemoglobin: 12.3 g/dL — ABNORMAL LOW (ref 13.0–17.0)
Immature Granulocytes: 0 %
Lymphocytes Relative: 13 %
Lymphs Abs: 1 10*3/uL (ref 0.7–4.0)
MCH: 28.9 pg (ref 26.0–34.0)
MCHC: 31.5 g/dL (ref 30.0–36.0)
MCV: 92 fL (ref 80.0–100.0)
Monocytes Absolute: 0.3 10*3/uL (ref 0.1–1.0)
Monocytes Relative: 4 %
Neutro Abs: 6.2 10*3/uL (ref 1.7–7.7)
Neutrophils Relative %: 82 %
Platelets: 180 10*3/uL (ref 150–400)
RBC: 4.25 MIL/uL (ref 4.22–5.81)
RDW: 15.2 % (ref 11.5–15.5)
WBC: 7.6 10*3/uL (ref 4.0–10.5)
nRBC: 0 % (ref 0.0–0.2)

## 2018-10-30 LAB — COMPREHENSIVE METABOLIC PANEL
ALT: 21 U/L (ref 0–44)
AST: 37 U/L (ref 15–41)
Albumin: 3.9 g/dL (ref 3.5–5.0)
Alkaline Phosphatase: 62 U/L (ref 38–126)
Anion gap: 13 (ref 5–15)
BUN: 27 mg/dL — ABNORMAL HIGH (ref 8–23)
CO2: 23 mmol/L (ref 22–32)
Calcium: 9.1 mg/dL (ref 8.9–10.3)
Chloride: 103 mmol/L (ref 98–111)
Creatinine, Ser: 1.88 mg/dL — ABNORMAL HIGH (ref 0.61–1.24)
GFR calc Af Amer: 38 mL/min — ABNORMAL LOW (ref >60)
GFR calc non Af Amer: 33 mL/min — ABNORMAL LOW (ref >60)
Glucose, Bld: 158 mg/dL — ABNORMAL HIGH (ref 70–99)
Potassium: 6.1 mmol/L — ABNORMAL HIGH (ref 3.5–5.1)
Sodium: 139 mmol/L (ref 135–145)
Total Bilirubin: 1.4 mg/dL — ABNORMAL HIGH (ref 0.3–1.2)
Total Protein: 7 g/dL (ref 6.5–8.1)

## 2018-10-30 LAB — POTASSIUM: Potassium: 4.9 mmol/L (ref 3.5–5.1)

## 2018-10-30 LAB — TROPONIN I: Troponin I: <0.03 ng/mL (ref <0.03)

## 2018-10-30 MED ORDER — ATORVASTATIN CALCIUM 10 MG PO TABS: 20.0000 mg | ORAL_TABLET | Freq: Every day | ORAL | Status: DC

## 2018-10-30 MED ORDER — POLYVINYL ALCOHOL 1.4 % OP SOLN
1.0000 [drp] | Freq: Three times a day (TID) | OPHTHALMIC | Status: DC | PRN
  Filled 2018-10-30: qty 15

## 2018-10-30 MED ORDER — SODIUM CHLORIDE 0.9 % IV SOLN
INTRAVENOUS | Status: DC
  Administered 2018-10-30: 22:00:00 via INTRAVENOUS

## 2018-10-30 MED ORDER — INSULIN ASPART 100 UNIT/ML ~~LOC~~ SOLN: 0.0000 [IU] | Freq: Three times a day (TID) | SUBCUTANEOUS | Status: DC

## 2018-10-30 MED ORDER — GABAPENTIN 100 MG PO CAPS
100.0000 mg | ORAL_CAPSULE | Freq: Every morning | ORAL | Status: DC
  Administered 2018-10-31: 100 mg via ORAL
  Filled 2018-10-30: qty 1

## 2018-10-30 MED ORDER — GABAPENTIN 100 MG PO CAPS
200.0000 mg | ORAL_CAPSULE | Freq: Every day | ORAL | Status: DC
  Administered 2018-10-30: 22:00:00 200 mg via ORAL
  Filled 2018-10-30: qty 2

## 2018-10-30 MED ORDER — ACETAMINOPHEN 650 MG RE SUPP: 650.0000 mg | Freq: Four times a day (QID) | RECTAL | Status: DC | PRN

## 2018-10-30 MED ORDER — ACETAMINOPHEN 325 MG PO TABS: 650.0000 mg | ORAL_TABLET | Freq: Four times a day (QID) | ORAL | Status: DC | PRN

## 2018-10-30 MED ORDER — APIXABAN 2.5 MG PO TABS
2.5000 mg | ORAL_TABLET | Freq: Two times a day (BID) | ORAL | Status: DC
  Administered 2018-10-30 – 2018-10-31 (×2): 2.5 mg via ORAL
  Filled 2018-10-30 (×2): qty 1

## 2018-10-30 MED ORDER — SODIUM CHLORIDE 0.9% FLUSH
3.0000 mL | Freq: Two times a day (BID) | INTRAVENOUS | Status: DC
  Administered 2018-10-30: 3 mL via INTRAVENOUS

## 2018-10-30 MED ORDER — INSULIN ASPART 100 UNIT/ML ~~LOC~~ SOLN: 0.0000 [IU] | Freq: Every day | SUBCUTANEOUS | Status: DC

## 2018-10-30 MED ORDER — SODIUM CHLORIDE 0.9 % IV BOLUS
500.0000 mL | Freq: Once | INTRAVENOUS | Status: AC
  Administered 2018-10-30: 500 mL via INTRAVENOUS

## 2018-10-30 MED ORDER — ALFUZOSIN HCL ER 10 MG PO TB24
10.0000 mg | ORAL_TABLET | Freq: Every day | ORAL | Status: DC
  Administered 2018-10-31: 09:00:00 10 mg via ORAL
  Filled 2018-10-30: qty 1

## 2018-10-30 MED ORDER — AMIODARONE HCL 200 MG PO TABS
200.0000 mg | ORAL_TABLET | Freq: Every day | ORAL | Status: DC
  Administered 2018-10-31: 09:00:00 200 mg via ORAL
  Filled 2018-10-30: qty 1

## 2018-10-30 MED ORDER — HYDRALAZINE HCL 20 MG/ML IJ SOLN: 5.0000 mg | INTRAMUSCULAR | Status: DC | PRN

## 2018-10-30 MED ORDER — GLUCOSAMINE-CHONDROITIN 250-200 MG PO TABS: ORAL_TABLET | Freq: Two times a day (BID) | ORAL | Status: DC

## 2018-10-30 NOTE — ED Provider Notes (Signed)
[facility] EMERGENCY DEPARTMENT Provider Note   CSN: [id number] Arrival date & time: 10/30/18  1402    History   Chief Complaint Chief Complaint  Patient presents with  . Loss of Consciousness    HPI [name] is a 84 y.o. male.     HPI Patient presents after syncopal episode.  Was out in the backyard of his daughter's place doing work.  States he began to feel bad.  States his vision got blurry which is not unusual for him.  However then he felt as if he had to have diarrhea.  States he went and had diarrhea and then felt worse.  Reportedly passed out.  Reportedly was only responsive to pain and was diaphoretic.  Reportedly had heart rate of 40 with a blood pressure of 70 palp.  Pressure now improved with some IV fluids.  States he has been feeling little fatigued and eating less over the last 2 days.  States that his probably related to a break-up he has had.  No chest pain.  No fevers or chills.  No trauma. Past Medical History:  Diagnosis Date  . A-fib ([place])    RVR-DCCV  . Abnormal PSA 01/17/2012   10.38/4/5/ PSA:2013=6.96/ PSA 07/08/2011=6.26/ PSA 2006=8.6  . 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 Blood transfusion without reported diagnosis    during vagotomy 1976  . Cancer [facility]) 2006   L-2 solitary metastatic lesion/no rad tx ,asymptomatic  . Chronic kidney disease    nephrolithiasis right kidney  . ED (erectile dysfunction)   . GERD (gastroesophageal reflux disease)    PUD S/P PARTIAL VAGOTOMY  . Gynecomastia 04/21/11   seen by Dr.Murray, no rad tx  . H/O impacted cerumen    S/P ENT DR BATES  . History of echocardiogram    echo 4/16:  mild LVH, EF 55-60%, Gr 1 DD, no RWMA, mild AI, mild MR, mod LAE, mild TR  . Hx of cardiovascular stress test    Myoview 5/16:  EF 50%, no scar or ischemia, low risk  . Hydroureteronephrosis    right   . Hyperglycemia    WITHOUT DM2  . Hyperlipidemia   . Hypertension   . Insomnia   .  Migraines    WITH AURA  . Nephrolithiasis   . Osteopenia   . Overweight   . Prostate cancer ([place]) 12/09/03 dx   Prostate ca/adenocarcinoma,gleason=3+3=6    pSA 4.6  . Prostate cancer ([place])    METASTATIC WITH L2 SCLEROTIC LESION ON LUPRON------DR. WOODRUFF  . RBBB (right bundle branch block)   . Rosacea   . Sinus bradycardia   . Sleep apnea    pt unaware  . Stroke [facility])    asymptomatic, found on head imaging  . Ulcer    peptic  . Use of leuprolide acetate (Lupron) 06/2005-11/2006   02/2008 degarelix 02/2008 x 3 doses    Patient Active Problem List   Diagnosis Date Noted  . Acute on chronic combined systolic (congestive) and diastolic (congestive) heart failure ([place]) 08/10/2018  . Mitral regurgitation 08/10/2018  . Atrial fibrillation ([place]) 06/14/2018  . Atrial fibrillation with RVR ([place]) 06/13/2018  . PAH (pulmonary artery hypertension) ([place]) 06/13/2018  . HTN (hypertension) 06/13/2018  . Acute CHF (congestive heart failure) ([place]) 04/28/2018  . Acute on chronic diastolic CHF (congestive heart failure) ([place]) 04/28/2018  . Acute on chronic kidney failure ([place]) 07/20/2015  . HLD (hyperlipidemia) 07/20/2015  . Stroke (cerebrum) ([place])  07/20/2015  . Elevated troponin 07/20/2015  . Diabetes mellitus without complication ([place]) 78/58/8502  . Nonsustained paroxysmal ventricular tachycardia ([place]) 12/08/2014  . RBBB 10/22/2014  . Bradycardia 10/22/2014  . PVC's (premature ventricular contractions) 10/22/2014  . Hypertensive urgency 10/20/2014  . Syncope and collapse 10/20/2014  . Use of leuprolide acetate (Lupron)   . Hydroureteronephrosis   . Blood transfusion without reported diagnosis   . Blurry vision   . Old lacunar strokes by MRI   . Hx of Prostate cancer   . Cancer ([place])   . Abnormal PSA 01/17/2012    Past Surgical History:  Procedure Laterality Date  . CARDIOVERSION N/A 06/19/2018   Procedure: CARDIOVERSION;  Surgeon: Larey Dresser, MD;  Location[facility] ENDOSCOPY;   Service: Cardiovascular;  Laterality: N/A;  . PROSTATE BIOPSY  12/09/03   adenocarcinoma,glerason: 3+3=6  . TEE WITHOUT CARDIOVERSION N/A 06/19/2018   Procedure: TRANSESOPHAGEAL ECHOCARDIOGRAM (TEE);  Surgeon: Larey Dresser, MD;  Location: [facility] ENDOSCOPY;  Service: Cardiovascular;  Laterality: N/A;  . VAGOTOMY  1978   partial        Home Medications    Prior to Admission medications   Medication Sig Start Date End Date Taking? Authorizing Provider  alfuzosin (UROXATRAL) 10 MG 24 hr tablet Take 10 mg by mouth daily. 03/08/18   [provider]  amiodarone (PACERONE) 200 MG tablet Take 200 mg by mouth daily.    [provider]  apixaban (ELIQUIS) 5 MG TABS tablet Take 1 tablet (5 mg total) by mouth 2 (two) times daily. 06/22/18   Thurnell Lose, MD  atorvastatin (LIPITOR) 20 MG tablet Take 1 tablet (20 mg total) by mouth daily. 04/29/18 06/13/18  Arrien, Jimmy Picket, MD  Calcium-Vitamin D (CALTRATE 600 PLUS-VIT D PO) Take 1 tablet by mouth daily.     [provider]  cephALEXin (KEFLEX) 250 MG capsule Take 1 capsule (250 mg total) by mouth 4 (four) times daily. 08/06/18   Hayden Rasmussen, MD  furosemide (LASIX) 40 MG tablet Take 1 tablet (40 mg total) by mouth daily. You may take an extra tablet as needed for shortness of breath and swelling 08/18/18   Daune Perch, NP  gabapentin (NEURONTIN) 100 MG capsule Take 1 capsule (100 mg total) by mouth 3 (three) times daily. 04/29/18 06/13/18  Arrien, Jimmy Picket, MD  lisinopril (PRINIVIL,ZESTRIL) 20 MG tablet Take 1 tablet (20 mg total) by mouth daily. 06/24/18   Thurnell Lose, MD  lisinopril (PRINIVIL,ZESTRIL) 20 MG tablet Take 20 mg by mouth 2 (two) times daily.    [provider]  metFORMIN (GLUCOPHAGE) 500 MG tablet Take 1 tablet (500 mg total) by mouth 2 (two) times daily with a meal. 04/29/18 06/13/18  Arrien, Jimmy Picket, MD  metoprolol succinate (TOPROL-XL) 50 MG 24 hr tablet Take 1  tablet (50 mg total) by mouth daily. Take with or immediately following a meal. 06/23/18   Thurnell Lose, MD  potassium chloride SA (K-DUR,KLOR-CON) 20 MEQ tablet Take 1 tablet (20 mEq total) by mouth daily as needed for up to 20 days (Take only if taking furosemide (Lasix)). Patient taking differently: Take 20 mEq by mouth daily as needed (only if taking Furosemide).  04/29/18 06/13/18  Arrien, Jimmy Picket, MD  PROAIR HFA 108 [phone] Base) MCG/ACT inhaler Inhale 2 puffs into the lungs See admin instructions. Inhale 2 puffs into lungs every 4-6 hours as needed for shortness of breath or wheezing 05/26/18   [provider]  Propylene Glycol (SYSTANE  COMPLETE) 0.6 % SOLN Place 1-2 drops into both eyes 3 (three) times daily as needed (for dryness).    [provider]  spironolactone (ALDACTONE) 25 MG tablet Take 1 tablet by mouth daily. 07/03/18   [provider]    Family History Family History  Problem Relation Age of Onset  . Liver cancer Father   . Kidney Stones Father   . Colon cancer Neg Hx   . Stroke Neg Hx     Social History Social History   Tobacco Use  . Smoking status: Former Smoker    Types: Pipe    Last attempt to quit: 07/05/1984    Years since quitting: 34.3  . Smokeless tobacco: Never Used  Substance Use Topics  . Alcohol use: No  . Drug use: No     Allergies   Aspirin   Review of Systems Review of Systems  Constitutional: Positive for appetite change and diaphoresis.  Respiratory: Negative for shortness of breath.   Cardiovascular: Negative for chest pain.  Gastrointestinal: Positive for diarrhea. Negative for abdominal distention.  Genitourinary: Negative for flank pain.  Musculoskeletal: Negative for arthralgias.  Neurological: Positive for syncope.  Psychiatric/Behavioral: Negative for confusion.     Physical Exam Updated Vital Signs BP (!) 141/71   Pulse (!) 53   Temp 97.6 F (36.4 C) (Oral)   Resp 12   Ht 5\' 11"   (1.803 m)   Wt 108.9 kg   SpO2 99%   BMI 33.47 kg/m   Physical Exam Vitals signs and nursing note reviewed.  HENT:     Head: Normocephalic.  Eyes:     Extraocular Movements: Extraocular movements intact.  Neck:     Musculoskeletal: Neck supple.  Cardiovascular:     Rate and Rhythm: Regular rhythm. Bradycardia present.  Pulmonary:     Effort: Pulmonary effort is normal.  Abdominal:     Tenderness: There is no abdominal tenderness.  Musculoskeletal:     Right lower leg: No edema.     Left lower leg: No edema.  Skin:    General: Skin is warm.     Capillary Refill: Capillary refill takes less than 2 seconds.  Neurological:     General: No focal deficit present.      ED Treatments / Results  Labs (all labs ordered are listed, but only abnormal results are displayed) Labs Reviewed  CBC WITH DIFFERENTIAL/PLATELET - Abnormal; Notable for the following components:      Result Value   Hemoglobin 12.3 (*)    All other components within normal limits  TROPONIN I  COMPREHENSIVE METABOLIC PANEL    EKG None  Radiology Dg Chest Portable 1 View  Result Date: 10/30/2018 CLINICAL DATA:  Syncope EXAM: PORTABLE CHEST 1 VIEW COMPARISON:  06/21/2018 and prior radiographs FINDINGS: Cardiomegaly again noted. Defibrillator/pacing pads overlying the chest noted. There is no evidence of focal airspace disease, pulmonary edema, suspicious pulmonary nodule/mass, pleural effusion, or pneumothorax. No acute bony abnormalities are identified. IMPRESSION: Cardiomegaly without evidence of acute cardiopulmonary disease. Electronically Signed   By: Margarette Canada M.D.   On: 10/30/2018 14:57    Procedures Procedures (including critical care time)  Medications Ordered in ED Medications - No data to display   Initial Impression / Assessment and Plan / ED Course  I have reviewed the triage vital signs and the nursing notes.  Pertinent labs & imaging results that were available during my care of  the patient were reviewed by me and considered in my medical  decision making (see chart for details).        Patient with syncope.  Potentially could have been a vagal event but also has history of atrial fibrillation.  In sinus rhythm now.  May require admission to the hospital for further work-up of the syncope.  Care turned over to Dr. Vanita Panda.  Final Clinical Impressions(s) / ED Diagnoses   Final diagnoses:  Syncope, unspecified syncope type    ED Discharge Orders    None       Davonna Belling, MD 10/30/18 1523

## 2018-10-30 NOTE — ED Provider Notes (Signed)
Patient awake and alert. We reviewed all findings, including elevated creatinine, elevated BUN.  Patient is on spironolactone and Lasix, which may be contributing to his presentation. With concern for syncope, patient will be admitted for further monitoring, management.   Carmin Muskrat, MD 10/30/18 [phone]

## 2018-10-30 NOTE — ED Notes (Signed)
ED TO INPATIENT HANDOFF REPORT  ED Nurse Name and Phone #: [phone]  S Name/Age/Gender [name] 84 y.o. male Room/Bed: 023C/023C  Code Status   Code Status: Full Code  Home/SNF/Other Home Patient oriented to: self, place, time and situation Is this baseline? Yes   Triage Complete: Triage complete  Chief Complaint synco  Triage Note Pt bib ems after doing yard work, acute onset of ams. Became unresponsive. On scene pt Pale cool diaphoretic. Responded to pain initially. Initial bp on scene 70 palpated. HR 44. En route BP increased. 500cc given by ems. 118/64, HR 48-55, cbg 176, RR 16.    Allergies Allergies  Allergen Reactions  . Aspirin Other (See Comments)    History of "stomach ulcers," was told to not take this     Level of Care/Admitting Diagnosis ED Disposition    ED Disposition Condition [place]: [place] [100100]  Level of Care: Telemetry Medical [104]  I expect the patient will be discharged within 24 hours: Yes  LOW acuity---Tx typically complete <24 hrs---ACUTE conditions typically can be evaluated <24 hours---LABS likely to return to acceptable levels <24 hours---IS near functional baseline---EXPECTED to return to current living arrangement---NOT newly hypoxic: Meets criteria for 5C-Observation unit  Covid Evaluation: N/A  Diagnosis: Syncope [206001]  Admitting Physician: Ivor Costa [[id number]]  Attending Physician: Ivor Costa [[id number]]  PT Class (Do Not Modify): Observation [104]  PT Acc Code (Do Not Modify): Observation [10022]       B Medical/Surgery History Past Medical History:  Diagnosis Date  . A-fib ([place])    RVR-DCCV  . Abnormal PSA 01/17/2012   10.38/4/5/ PSA:2013=6.96/ PSA 07/08/2011=6.26/ PSA 2006=8.6  . 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 Blood transfusion without reported diagnosis    during vagotomy 1976  . Cancer [facility]) 2006   L-2 solitary metastatic lesion/no rad tx  ,asymptomatic  . Chronic kidney disease    nephrolithiasis right kidney  . ED (erectile dysfunction)   . GERD (gastroesophageal reflux disease)    PUD S/P PARTIAL VAGOTOMY  . Gynecomastia 04/21/11   seen by Dr.Murray, no rad tx  . H/O impacted cerumen    S/P ENT DR BATES  . History of echocardiogram    echo 4/16:  mild LVH, EF 55-60%, Gr 1 DD, no RWMA, mild AI, mild MR, mod LAE, mild TR  . Hx of cardiovascular stress test    Myoview 5/16:  EF 50%, no scar or ischemia, low risk  . Hydroureteronephrosis    right   . Hyperglycemia    WITHOUT DM2  . Hyperlipidemia   . Hypertension   . Insomnia   . Migraines    WITH AURA  . Nephrolithiasis   . Osteopenia   . Overweight   . Prostate cancer ([place]) 12/09/03 dx   Prostate ca/adenocarcinoma,gleason=3+3=6    pSA 4.6  . Prostate cancer ([place])    METASTATIC WITH L2 SCLEROTIC LESION ON LUPRON------DR. WOODRUFF  . RBBB (right bundle branch block)   . Rosacea   . Sinus bradycardia   . Sleep apnea    pt unaware  . Stroke [facility])    asymptomatic, found on head imaging  . Ulcer    peptic  . Use of leuprolide acetate (Lupron) 06/2005-11/2006   02/2008 degarelix 02/2008 x 3 doses   Past Surgical History:  Procedure Laterality Date  . CARDIOVERSION N/A 06/19/2018   Procedure: CARDIOVERSION;  Surgeon: Larey Dresser, MD;  Location: [facility] ENDOSCOPY;  Service: Cardiovascular;  Laterality: N/A;  . PROSTATE BIOPSY  12/09/03   adenocarcinoma,glerason: 3+3=6  . TEE WITHOUT CARDIOVERSION N/A 06/19/2018   Procedure: TRANSESOPHAGEAL ECHOCARDIOGRAM (TEE);  Surgeon: Larey Dresser, MD;  Location: [facility] ENDOSCOPY;  Service: Cardiovascular;  Laterality: N/A;  . VAGOTOMY  1978   partial     A IV Location/Drains/Wounds Patient Lines/Drains/Airways Status   Active Line/Drains/Airways    Name:   Placement date:   Placement time:   Site:   Days:   Peripheral IV 10/30/18 Left Forearm   10/30/18    1327    Forearm   less than 1          Intake/Output Last  24 hours No intake or output data in the 24 hours ending 10/30/18 2051  Labs/Imaging Results for orders placed or performed during the hospital encounter of 10/30/18 (from the past 48 hour(s))  CBC with Differential     Status: Abnormal   Collection Time: 10/30/18  2:24 PM  Result Value Ref Range   WBC 7.6 4.0 - 10.5 K/uL   RBC 4.25 4.22 - 5.81 MIL/uL   Hemoglobin 12.3 (L) 13.0 - 17.0 g/dL   HCT 39.1 39.0 - 52.0 %   MCV 92.0 80.0 - 100.0 fL   MCH 28.9 26.0 - 34.0 pg   MCHC 31.5 30.0 - 36.0 g/dL   RDW 15.2 11.5 - 15.5 %   Platelets 180 150 - 400 K/uL   nRBC 0.0 0.0 - 0.2 %   Neutrophils Relative % 82 %   Neutro Abs 6.2 1.7 - 7.7 K/uL   Lymphocytes Relative 13 %   Lymphs Abs 1.0 0.7 - 4.0 K/uL   Monocytes Relative 4 %   Monocytes Absolute 0.3 0.1 - 1.0 K/uL   Eosinophils Relative 1 %   Eosinophils Absolute 0.1 0.0 - 0.5 K/uL   Basophils Relative 0 %   Basophils Absolute 0.0 0.0 - 0.1 K/uL   Immature Granulocytes 0 %   Abs Immature Granulocytes 0.03 0.00 - 0.07 K/uL    Comment: Performed at [facility], [address], [place], [place] [address]  Troponin I - Once     Status: None   Collection Time: 10/30/18  2:24 PM  Result Value Ref Range   Troponin I <0.03 <0.03 ng/mL    Comment: Performed at [place] [address], [place], [place] [address]  Comprehensive metabolic panel     Status: Abnormal   Collection Time: 10/30/18  2:24 PM  Result Value Ref Range   Sodium 139 135 - 145 mmol/L   Potassium 6.1 (H) 3.5 - 5.1 mmol/L    Comment: SLIGHT HEMOLYSIS   Chloride 103 98 - 111 mmol/L   CO2 23 22 - 32 mmol/L   Glucose, Bld 158 (H) 70 - 99 mg/dL   BUN 27 (H) 8 - 23 mg/dL   Creatinine, Ser 1.88 (H) 0.61 - 1.24 mg/dL   Calcium 9.1 8.9 - 10.3 mg/dL   Total Protein 7.0 6.5 - 8.1 g/dL   Albumin 3.9 3.5 - 5.0 g/dL   AST 37 15 - 41 U/L   ALT 21 0 - 44 U/L   Alkaline Phosphatase 62 38 - 126 U/L   Total Bilirubin 1.4 (H) 0.3 - 1.2 mg/dL   GFR calc non Af Amer  33 (L) >60 mL/min   GFR calc Af Amer 38 (L) >60 mL/min   Anion gap 13 5 - 15  Comment: Performed at [facility], [place] [address], [place], [place] [address]  Potassium     Status: None   Collection Time: 10/30/18  5:24 PM  Result Value Ref Range   Potassium 4.9 3.5 - 5.1 mmol/L    Comment: Performed at [facility], [place] [address] [place], [place] [address]   Dg Chest Portable 1 View  Result Date: 10/30/2018 CLINICAL DATA:  Syncope EXAM: PORTABLE CHEST 1 VIEW COMPARISON:  06/21/2018 and prior radiographs FINDINGS: Cardiomegaly again noted. Defibrillator/pacing pads overlying the chest noted. There is no evidence of focal airspace disease, pulmonary edema, suspicious pulmonary nodule/mass, pleural effusion, or pneumothorax. No acute bony abnormalities are identified. IMPRESSION: Cardiomegaly without evidence of acute cardiopulmonary disease. Electronically Signed   By: Margarette Canada M.D.   On: 10/30/2018 14:57    Pending Labs Unresulted Labs (From admission, onward)    Start     Ordered   10/31/18 0500  Brain natriuretic peptide  Tomorrow morning,   R     10/30/18 1957          Vitals/Pain Today's Vitals   10/30/18 1800 10/30/18 1815 10/30/18 1830 10/30/18 1940  BP: (!) 159/77 (!) 154/77 (!) 159/78 (!) 148/82  Pulse: (!) 56 (!) 53 (!) 55 (!) 52  Resp: 16 15 16 17   Temp:      TempSrc:      SpO2: 100% 99% 98% 100%  Weight:      Height:      PainSc:        Isolation Precautions No active isolations  Medications Medications  amiodarone (PACERONE) tablet 200 mg (has no administration in time range)  atorvastatin (LIPITOR) tablet 20 mg (has no administration in time range)  alfuzosin (UROXATRAL) 24 hr tablet 10 mg (has no administration in time range)  apixaban (ELIQUIS) tablet 2.5 mg (has no administration in time range)  gabapentin (NEURONTIN) capsule 100 mg (has no administration in time range)  Propylene Glycol 0.6 % SOLN 1-2 drop (has no administration in  time range)  insulin aspart (novoLOG) injection 0-9 Units (has no administration in time range)  insulin aspart (novoLOG) injection 0-5 Units (has no administration in time range)  hydrALAZINE (APRESOLINE) injection 5 mg (has no administration in time range)  sodium chloride flush (NS) 0.9 % injection 3 mL (has no administration in time range)  acetaminophen (TYLENOL) tablet 650 mg (has no administration in time range)    Or  acetaminophen (TYLENOL) suppository 650 mg (has no administration in time range)  0.9 %  sodium chloride infusion (has no administration in time range)  gabapentin (NEURONTIN) capsule 200 mg (has no administration in time range)  sodium chloride 0.9 % bolus 500 mL (0 mLs Intravenous Stopped 10/30/18 1908)    Mobility walks Low fall risk   Focused Assessments Cardiac Assessment Handoff:  Cardiac Rhythm: Sinus bradycardia Lab Results  Component Value Date   TROPONINI <0.03 10/30/2018   No results found for: DDIMER Does the Patient currently have chest pain? No     R Recommendations: See Admitting Provider Note  Report given to:   Additional Notes:

## 2018-10-30 NOTE — ED Notes (Signed)
ED Provider at bedside. 

## 2018-10-30 NOTE — H&P (Signed)
History and Physical    [name] EXB:[id number] DOB: 02-11-1935 DOA: 10/30/2018  Referring MD/NP/PA:   PCP: [name], MD   Patient coming from:  The patient is coming from home.  At baseline, pt is partially dependent for most of ADL.        Chief Complaint: syncope  HPI: [name] is a 84 y.o. male with medical history significant of dementia, hypertension, hyperlipidemia, diabetes mellitus, sCHF, stroke, GERD, atrial fibrillation on Eliquis, prostate cancer, right bundle blockage, OSA not on CPAP, peptic ulcer disease, CKD 3, dementia, who presents with syncope.  Per report, pt was out in the backyard of his daughter's place at about 1:00 PM, and began to feel bad, and had blurred vision. He felt a need to have a bowel movement, then went inside. He had one episode of diarrhea, then reportedly passed out briefly. Pt is not sure how long he has passed out. Per report, pt had blood pressure of 70s, RR 16 and HR 44. His Bp improved to 119/65 after giving 500 cc of NS. Pt states that he had mild abdominal discomfort, which has resolved.  No nausea or vomiting.  Denies symptoms of UTI.  Patient denies unilateral weakness, numbness or tingling to extremities.  No facial droop or slurred speech.  He does not have chest pain or shortness of breath.  No cough, fever or chills. Pt states he has been feeling little fatigued and eating less over the last 2 days.  ED Course: pt was found to have WBC 7.6, negative troponin, worsening renal function, temperature normal, oxygen saturation 95% on room air, heart rate 50s, current blood pressure 159/82.  Chest x-ray showed cardiomegaly without pulmonary edema. Pt is place on tele bed for obs.   Review of Systems:   General: no fevers, chills, no body weight gain, has poor appetite, has fatigue HEENT: had blurry vision, no hearing changes or sore throat Respiratory: no dyspnea, coughing, wheezing CV: no chest pain, no palpitations GI: no nausea,  vomiting, has abdominal discomfort and diarrhea, no constipation GU: no dysuria, burning on urination, increased urinary frequency, hematuria  Ext: no leg edema Neuro: no unilateral weakness, numbness, or tingling, no vision loss or hearing loss. Has syncope Skin: no rash, no skin tear. MSK: No muscle spasm, no deformity, no limitation of range of movement in spin Heme: No easy bruising.  Travel history: No recent long distant travel.  Allergy:  Allergies  Allergen Reactions  . Aspirin Other (See Comments)    History of "stomach ulcers," was told to not take this     Past Medical History:  Diagnosis Date  . A-fib ([place])    RVR-DCCV  . Abnormal PSA 01/17/2012   10.38/4/5/ PSA:2013=6.96/ PSA 07/08/2011=6.26/ PSA 2006=8.6  . 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 Blood transfusion without reported diagnosis    during vagotomy 1976  . Cancer [facility]) 2006   L-2 solitary metastatic lesion/no rad tx ,asymptomatic  . Chronic kidney disease    nephrolithiasis right kidney  . ED (erectile dysfunction)   . GERD (gastroesophageal reflux disease)    PUD S/P PARTIAL VAGOTOMY  . Gynecomastia 04/21/11   seen by Dr.Murray, no rad tx  . H/O impacted cerumen    S/P ENT DR BATES  . History of echocardiogram    echo 4/16:  mild LVH, EF 55-60%, Gr 1 DD, no RWMA, mild AI, mild MR, mod LAE, mild TR  . Hx of cardiovascular stress  test    Myoview 5/16:  EF 50%, no scar or ischemia, low risk  . Hydroureteronephrosis    right   . Hyperglycemia    WITHOUT DM2  . Hyperlipidemia   . Hypertension   . Insomnia   . Migraines    WITH AURA  . Nephrolithiasis   . Osteopenia   . Overweight   . Prostate cancer ([place]) 12/09/03 dx   Prostate ca/adenocarcinoma,gleason=3+3=6    pSA 4.6  . Prostate cancer ([place])    METASTATIC WITH L2 SCLEROTIC LESION ON LUPRON------DR. WOODRUFF  . RBBB (right bundle branch block)   . Rosacea   . Sinus bradycardia   . Sleep apnea    pt unaware  . Stroke  [facility])    asymptomatic, found on head imaging  . Ulcer    peptic  . Use of leuprolide acetate (Lupron) 06/2005-11/2006   02/2008 degarelix 02/2008 x 3 doses    Past Surgical History:  Procedure Laterality Date  . CARDIOVERSION N/A 06/19/2018   Procedure: CARDIOVERSION;  Surgeon: Larey Dresser, MD;  Location: [facility] ENDOSCOPY;  Service: Cardiovascular;  Laterality: N/A;  . PROSTATE BIOPSY  12/09/03   adenocarcinoma,glerason: 3+3=6  . TEE WITHOUT CARDIOVERSION N/A 06/19/2018   Procedure: TRANSESOPHAGEAL ECHOCARDIOGRAM (TEE);  Surgeon: Larey Dresser, MD;  Location: [facility] ENDOSCOPY;  Service: Cardiovascular;  Laterality: N/A;  . VAGOTOMY  1978   partial    Social History:  reports that he quit smoking about 34 years ago. His smoking use included pipe. He has never used smokeless tobacco. He reports that he does not drink alcohol or use drugs.  Family History:  Family History  Problem Relation Age of Onset  . Liver cancer Father   . Kidney Stones Father   . Colon cancer Neg Hx   . Stroke Neg Hx      Prior to Admission medications   Medication Sig Start Date End Date Taking? Authorizing Provider  alfuzosin (UROXATRAL) 10 MG 24 hr tablet Take 10 mg by mouth daily. 03/08/18  Yes [provider]  amiodarone (PACERONE) 200 MG tablet Take 200 mg by mouth daily.   Yes [provider]  apixaban (ELIQUIS) 5 MG TABS tablet Take 1 tablet (5 mg total) by mouth 2 (two) times daily. Patient taking differently: Take 2.5 mg by mouth 2 (two) times daily.  06/22/18  Yes Thurnell Lose, MD  atorvastatin (LIPITOR) 20 MG tablet Take 1 tablet (20 mg total) by mouth daily. 04/29/18 11/29/18 Yes Arrien, Jimmy Picket, MD  Calcium-Vitamin D (CALTRATE 600 PLUS-VIT D PO) Take 1 tablet by mouth daily.    Yes [provider]  furosemide (LASIX) 40 MG tablet Take 1 tablet (40 mg total) by mouth daily. You may take an extra tablet as needed for shortness of breath and swelling Patient  taking differently: Take 20 mg by mouth 2 (two) times daily.  08/18/18  Yes Daune Perch, NP  gabapentin (NEURONTIN) 100 MG capsule Take 1 capsule (100 mg total) by mouth 3 (three) times daily. Patient taking differently: Take 100-200 mg by mouth See admin instructions. Take one capsule (100 mg) by mouth every morning and two capsules (200 mg) at night 04/29/18 11/29/18 Yes Arrien, Jimmy Picket, MD  GLUCOSAMINE-CHONDROITIN PO Take 1 tablet by mouth 2 (two) times a day.   Yes [provider]  lisinopril (PRINIVIL,ZESTRIL) 20 MG tablet Take 1 tablet (20 mg total) by mouth daily. Patient taking differently: Take 20 mg by mouth 2 (two) times a day.  06/24/18  Yes Thurnell Lose, MD  metFORMIN (GLUCOPHAGE) 500 MG tablet Take 1 tablet (500 mg total) by mouth 2 (two) times daily with a meal. 04/29/18 11/29/18 Yes Arrien, Jimmy Picket, MD  metoprolol succinate (TOPROL-XL) 50 MG 24 hr tablet Take 1 tablet (50 mg total) by mouth daily. Take with or immediately following a meal. 06/23/18  Yes Thurnell Lose, MD  potassium chloride SA (K-DUR,KLOR-CON) 20 MEQ tablet Take 1 tablet (20 mEq total) by mouth daily as needed for up to 20 days (Take only if taking furosemide (Lasix)). Patient taking differently: Take 20 mEq by mouth every other day.  04/29/18 11/29/18 Yes Arrien, Jimmy Picket, MD  Propylene Glycol (SYSTANE COMPLETE) 0.6 % SOLN Place 1-2 drops into both eyes 3 (three) times daily as needed (for dryness).   Yes [provider]  spironolactone (ALDACTONE) 25 MG tablet Take 25 mg by mouth daily.  07/03/18  Yes [provider]    Physical Exam: Vitals:   10/30/18 1815 10/30/18 1830 10/30/18 1940 10/30/18 2150  BP: (!) 154/77 (!) 159/78 (!) 148/82 (!) 169/95  Pulse: (!) 53 (!) 55 (!) 52 60  Resp: 15 16 17 20   Temp:    97.9 F (36.6 C)  TempSrc:    Oral  SpO2: 99% 98% 100% 98%  Weight:      Height:       General: Not in acute distress HEENT:       Eyes:  PERRL, EOMI, no scleral icterus.       ENT: No discharge from the ears and nose, no pharynx injection, no tonsillar enlargement.        Neck: No JVD, no bruit, no mass felt. Heme: No neck lymph node enlargement. Cardiac: S1/S2, RRR, No murmurs, No gallops or rubs. Respiratory: No rales, wheezing, rhonchi or rubs. GI: Soft, nondistended, nontender, no rebound pain, no organomegaly, BS present. GU: No hematuria Ext: No pitting leg edema bilaterally. 2+DP/PT pulse bilaterally. Musculoskeletal: No joint deformities, No joint redness or warmth, no limitation of ROM in spin. Skin: No rashes.  Neuro: Alert, oriented X3, cranial nerves II-XII grossly intact, moves all extremities normally. Muscle strength 5/5 in all extremities, sensation to light touch intact. Brachial reflex 2+ bilaterally. Psych: Patient is not psychotic, no suicidal or hemocidal ideation.  Labs on Admission: I have personally reviewed following labs and imaging studies  CBC: Recent Labs  Lab 10/30/18 1424  WBC 7.6  NEUTROABS 6.2  HGB 12.3*  HCT 39.1  MCV 92.0  PLT 301   Basic Metabolic Panel: Recent Labs  Lab 10/30/18 1424 10/30/18 1724  NA 139  --   K 6.1* 4.9  CL 103  --   CO2 23  --   GLUCOSE 158*  --   BUN 27*  --   CREATININE 1.88*  --   CALCIUM 9.1  --    GFR: Estimated Creatinine Clearance: 38.7 mL/min (A) (by C-G formula based on SCr of 1.88 mg/dL (H)). Liver Function Tests: Recent Labs  Lab 10/30/18 1424  AST 37  ALT 21  ALKPHOS 62  BILITOT 1.4*  PROT 7.0  ALBUMIN 3.9   No results for input(s): LIPASE, AMYLASE in the last 168 hours. No results for input(s): AMMONIA in the last 168 hours. Coagulation Profile: No results for input(s): INR, PROTIME in the last 168 hours. Cardiac Enzymes: Recent Labs  Lab 10/30/18 1424  TROPONINI <0.03   BNP (last 3 results) Recent Labs    08/10/18 1148  PROBNP 942*   HbA1C: No results for input(s): HGBA1C in the last 72 hours. CBG: No results  for input(s): GLUCAP in the last 168 hours. Lipid Profile: No results for input(s): CHOL, HDL, LDLCALC, TRIG, CHOLHDL, LDLDIRECT in the last 72 hours. Thyroid Function Tests: No results for input(s): TSH, T4TOTAL, FREET4, T3FREE, THYROIDAB in the last 72 hours. Anemia Panel: No results for input(s): VITAMINB12, FOLATE, FERRITIN, TIBC, IRON, RETICCTPCT in the last 72 hours. Urine analysis:    Component Value Date/Time   COLORURINE YELLOW 06/20/2018 1318   APPEARANCEUR CLEAR 06/20/2018 1318   LABSPEC 1.015 06/20/2018 1318   PHURINE 5.0 06/20/2018 1318   GLUCOSEU NEGATIVE 06/20/2018 1318   HGBUR NEGATIVE 06/20/2018 1318   BILIRUBINUR NEGATIVE 06/20/2018 1318   KETONESUR NEGATIVE 06/20/2018 1318   PROTEINUR NEGATIVE 06/20/2018 1318   UROBILINOGEN 0.2 11/25/2008 0201   NITRITE NEGATIVE 06/20/2018 1318   LEUKOCYTESUR NEGATIVE 06/20/2018 1318   Sepsis Labs: @LABRCNTIP (procalcitonin:4,lacticidven:4) )No results found for this or any previous visit (from the past 240 hour(s)).   Radiological Exams on Admission: Dg Chest Portable 1 View  Result Date: 10/30/2018 CLINICAL DATA:  Syncope EXAM: PORTABLE CHEST 1 VIEW COMPARISON:  06/21/2018 and prior radiographs FINDINGS: Cardiomegaly again noted. Defibrillator/pacing pads overlying the chest noted. There is no evidence of focal airspace disease, pulmonary edema, suspicious pulmonary nodule/mass, pleural effusion, or pneumothorax. No acute bony abnormalities are identified. IMPRESSION: Cardiomegaly without evidence of acute cardiopulmonary disease. Electronically Signed   By: Margarette Canada M.D.   On: 10/30/2018 14:57     EKG: Independently reviewed.  Sinus rhythm, QTC 522, bradycardia, bifascicular block, poor R wave progression  Assessment/Plan Principal Problem:   Syncope and collapse Active Problems:   HLD (hyperlipidemia)   Stroke (cerebrum) (HCC)   HTN (hypertension)   Atrial fibrillation (HCC)   Chronic systolic CHF (congestive heart  failure) (HCC)   Acute renal failure superimposed on stage 3 chronic kidney disease (HCC)   Syncope and collapse: Etiology is not clear. Per report pt had hypotension with BP 70s and bradycardia with HR of 44. Currently his Bp is 159/82 and HR at 50s. Pt could have vasovagal syncope.  Other differential diagnosis includeTIA, ACS (less likely, given no chest pain) and orthostatic status. Pt just had 2D echo on 09/07/2018 which showed EF 50-55%. No focal neurologic findings on physical examination.  - Place on tele bed for obs - Orthostatic vital signs  - MRI-brain - Neuro checks  - IVF: NS 500 cc and then 75 cc/h - hold metoprolol, Lasix and spironolactone tonight - Neuro check  HLD (hyperlipidemia): -Lipitor  Stroke (cerebrum) ([place]): -On Eliquis and Lipitor  HTN (hypertension): -hold metoprolol due to bradycardia -Hold lisinopril due to worsening renal function -Hold Lasix and spironolactone as above -IV hydralazine.  Atrial fibrillation [facility]): CHA2DS2-VASc Score is 7, needs oral anticoagulation. Patient is on Eliquis at home. Heart rate is slow, at 50s now. -hold metoprolol -Continue amiodarone -Continue Eliquis   Chronic systolic CHF (congestive heart failure) ([place]): 2D echo on 09/07/2018 showed EF of 50-55%.  No leg edema JVD.  CHF is compensated. - Check BNP -Hold Lasix and spironolactone  Acute renal failure superimposed on stage 3 chronic kidney disease ([place]): Baseline Cre is 1.2-1.3.  His creatinine is 1.88, BUN 27. Likely due to dehydration and continuation of ACEI, diuretics. - IVF as above - Follow up renal function by BMP - Hold diuretics and lisinopril   DVT ppx: Eliquis Code Status: Full code Family Communication: None at  bed side.  Disposition Plan:  Anticipate discharge back to previous home environment Consults called:  none Admission status: Obs / tele     Date of Service 10/30/2018    [facility]   If 7PM-7AM, please contact  night-coverage [email or web address] Password [facility] 10/30/2018, 10:59 PM

## 2018-10-30 NOTE — ED Triage Notes (Signed)
Pt bib ems after doing yard work, acute onset of ams. Became unresponsive. On scene pt Pale cool diaphoretic. Responded to pain initially. Initial bp on scene 70 palpated. HR 44. En route BP increased. 500cc given by ems. 118/64, HR 48-55, cbg 176, RR 16.

## 2018-10-31 ENCOUNTER — Observation Stay (HOSPITAL_COMMUNITY): Payer: Medicare Other

## 2018-10-31 DIAGNOSIS — R55 Syncope and collapse: Secondary | ICD-10-CM | POA: Diagnosis not present

## 2018-10-31 LAB — BASIC METABOLIC PANEL
Anion gap: 6 (ref 5–15)
BUN: 24 mg/dL — ABNORMAL HIGH (ref 8–23)
CO2: 26 mmol/L (ref 22–32)
Calcium: 8.6 mg/dL — ABNORMAL LOW (ref 8.9–10.3)
Chloride: 108 mmol/L (ref 98–111)
Creatinine, Ser: 1.38 mg/dL — ABNORMAL HIGH (ref 0.61–1.24)
GFR calc Af Amer: 55 mL/min — ABNORMAL LOW (ref >60)
GFR calc non Af Amer: 48 mL/min — ABNORMAL LOW (ref >60)
Glucose, Bld: 106 mg/dL — ABNORMAL HIGH (ref 70–99)
Potassium: 4.2 mmol/L (ref 3.5–5.1)
Sodium: 140 mmol/L (ref 135–145)

## 2018-10-31 LAB — BRAIN NATRIURETIC PEPTIDE: B Natriuretic Peptide: 126.8 pg/mL — ABNORMAL HIGH (ref 0.0–100.0)

## 2018-10-31 MED ORDER — GABAPENTIN 100 MG PO CAPS: 100.0000 mg | ORAL_CAPSULE | ORAL | Status: DC

## 2018-10-31 MED ORDER — APIXABAN 5 MG PO TABS: 2.5000 mg | ORAL_TABLET | Freq: Two times a day (BID) | ORAL | Status: DC

## 2018-10-31 MED ORDER — METOPROLOL SUCCINATE ER 25 MG PO TB24: 50.0000 mg | ORAL_TABLET | Freq: Every day | ORAL | 0 refills | Status: DC

## 2018-10-31 NOTE — TOC Initial Note (Signed)
Transition of Care [facility]) - Initial/Assessment Note    Patient Details  Name: [name] MRN: [id number] Date of Birth: 1934/12/11  Transition of Care [facility]) CM/SW Contact:    Pollie Friar, RN Phone Number: 10/31/2018, 11:23 AM  Clinical Narrative:                   Expected Discharge Plan: Home/Self Care Barriers to Discharge: No Barriers Identified   Patient Goals and CMS Choice Patient states their goals for this hospitalization and ongoing recovery are:: to get back home      Expected Discharge Plan and Services Expected Discharge Plan: Home/Self Care       Living arrangements for the past 2 months: Single Family Home(4 levels) Expected Discharge Date: 10/31/18                                    Prior Living Arrangements/Services Living arrangements for the past 2 months: Single Family Home(4 levels) Lives with:: Roommate Patient language and need for interpreter reviewed:: Yes(no needs) Do you feel safe going back to the place where you live?: Yes      Need for Family Participation in Patient Care: No (Comment) Care giver support system in place?: No (comment)   Criminal Activity/Legal Involvement Pertinent to Current Situation/Hospitalization: No - Comment as needed  Activities of Daily Living Home Assistive Devices/Equipment: None ADL Screening (condition at time of admission) Patient's cognitive ability adequate to safely complete daily activities?: Yes Is the patient deaf or have difficulty hearing?: Yes Does the patient have difficulty seeing, even when wearing glasses/contacts?: Yes Does the patient have difficulty concentrating, remembering, or making decisions?: No Patient able to express need for assistance with ADLs?: Yes Does the patient have difficulty dressing or bathing?: No Independently performs ADLs?: Yes (appropriate for developmental age) Does the patient have difficulty walking or climbing stairs?: No Weakness of Legs:  None Weakness of Arms/Hands: None  Permission Sought/Granted                  Emotional Assessment Appearance:: Appears stated age Attitude/Demeanor/Rapport: Engaged Affect (typically observed): Accepting, Appropriate, Pleasant Orientation: : Oriented to Self, Oriented to Place, Oriented to  Time, Oriented to Situation   Psych Involvement: No (comment)  Admission diagnosis:  Syncope, unspecified syncope type [R55] Patient Active Problem List   Diagnosis Date Noted  . Chronic systolic CHF (congestive heart failure) ([place]) 10/30/2018  . Acute renal failure superimposed on stage 3 chronic kidney disease ([place]) 10/30/2018  . Syncope 10/30/2018  . Acute on chronic combined systolic (congestive) and diastolic (congestive) heart failure ([place]) 08/10/2018  . Mitral regurgitation 08/10/2018  . Atrial fibrillation ([place]) 06/14/2018  . Atrial fibrillation with RVR ([place]) 06/13/2018  . PAH (pulmonary artery hypertension) ([place]) 06/13/2018  . HTN (hypertension) 06/13/2018  . Acute CHF (congestive heart failure) ([place]) 04/28/2018  . Acute on chronic diastolic CHF (congestive heart failure) ([place]) 04/28/2018  . Acute on chronic kidney failure ([place]) 07/20/2015  . HLD (hyperlipidemia) 07/20/2015  . Stroke (cerebrum) ([place]) 07/20/2015  . Elevated troponin 07/20/2015  . Diabetes mellitus without complication ([place]) 23/55/7322  . Nonsustained paroxysmal ventricular tachycardia ([place]) 12/08/2014  . RBBB 10/22/2014  . Bradycardia 10/22/2014  . PVC's (premature ventricular contractions) 10/22/2014  . Hypertensive urgency 10/20/2014  . Syncope and collapse 10/20/2014  . Use of leuprolide acetate (Lupron)   . Hydroureteronephrosis   . Blood transfusion without reported diagnosis   .  Blurry vision   . Old lacunar strokes by MRI   . Hx of Prostate cancer   . Cancer ([place])   . Abnormal PSA 01/17/2012   PCP:  [name], MD Pharmacy:   [facility] [address] [place]  AT [place] [address] [place] [address] Phone: [phone] Fax: [phone]     Social Determinants of Health (SDOH) Interventions    Readmission Risk Interventions Readmission Risk Prevention Plan 10/31/2018  Transportation Screening Complete  PCP or Specialist Appt within 3-5 Days Complete  HRI or [place] Not Complete  HRI or Home Care Consult comments no needs  Social Work Consult for [place] Planning/Counseling Not Complete  SW consult not completed comments no needs  Palliative Care Screening Not Applicable  Medication Review (RN Transport planner) Referral to Pharmacy  Some recent data might be hidden

## 2018-10-31 NOTE — Discharge Summary (Signed)
Physician Discharge Summary  [name] GXQ:[id number] DOB: Nov 19, 1934 DOA: 10/30/2018  PCP: [name], MD  Admit date: 10/30/2018 Discharge date: 10/31/2018  Admitted From: home Discharge disposition: home   Recommendations for Outpatient Follow-Up:   1. Encouraged hydration, especially when working outside 2. BB dose decreased, may need further titration-- also may need less diuretics as well-- cardiology reduced in February already   Discharge Diagnosis:   Principal Problem:   Syncope and collapse Active Problems:   HLD (hyperlipidemia)   Stroke (cerebrum) (HCC)   HTN (hypertension)   Atrial fibrillation (HCC)   Chronic systolic CHF (congestive heart failure) (HCC)   Acute renal failure superimposed on stage 3 chronic kidney disease ([place])    Discharge Condition: Improved.  Diet recommendation: Low sodium, heart healthy  Wound care: None.  Code status: Full.   History of Present Illness:   [name] is a 84 y.o. male with medical history significant of dementia, hypertension, hyperlipidemia, diabetes mellitus, sCHF, stroke, GERD, atrial fibrillation on Eliquis, prostate cancer, right bundle blockage, OSA not on CPAP, peptic ulcer disease, CKD 3, dementia, who presents with syncope.   Hospital Course by Problem:   Syncope and collapse:  -suspect vasovagal syncope.  -just had 2D echo on 09/07/2018 which showed EF 50-55%. No focal neurologic findings on physical examination. - MRI-brain: 1. No acute abnormality to explain syncopal episode. 2. Remote lacunar infarcts involving the right centrum semi ovale and the left basal ganglia. 3. Moderate atrophy and white matter disease likely reflects the sequela of chronic microvascular ischemia. 4. Multiple punctate foci of susceptibility consistent with remote foci of punctate hemorrhage suggest an underlying vasculitis. No acute hemorrhage is present. - improved s/p IVF  HLD (hyperlipidemia): -Lipitor   H/o Stroke (cerebrum) ([place]): -On Eliquis and Lipitor  HTN (hypertension): -reduced dose of metoprolol due to bradycardia  Atrial fibrillation ([place]): CHA2DS2-VASc Score is 7, needs oral anticoagulation. Patient is on Eliquis at home.    Chronic systolic CHF (congestive heart failure) ([place]): 2D echo on 09/07/2018 showed EF of 50-55%.  No leg edema JVD.  CHF is compensated.  Acute renal failure superimposed on stage 3 chronic kidney disease ([place]): Baseline Cre is 1.2-1.3.  His creatinine is 1.88, BUN 27. Likely due to dehydration  -monitoring of diuretic as an outpatient-- may need further reduction    Medical Consultants:      Discharge Exam:   Vitals:   10/31/18 0509 10/31/18 0834  BP: (!) 168/88 131/72  Pulse: (!) 56 (!) 52  Resp: 20 15  Temp: 98.1 F (36.7 C) (!) 97.5 F (36.4 C)  SpO2: 95% 95%   Vitals:   10/30/18 2351 10/31/18 0500 10/31/18 0509 10/31/18 0834  BP: 139/75  (!) 168/88 131/72  Pulse: (!) 59  (!) 56 (!) 52  Resp: 20  20 15   Temp: 98.1 F (36.7 C)  98.1 F (36.7 C) (!) 97.5 F (36.4 C)  TempSrc: Oral  Oral Oral  SpO2: 95%  95% 95%  Weight:  110.2 kg    Height:        General exam: Appears calm and comfortable. Up walking around  The results of significant diagnostics from this hospitalization (including imaging, microbiology, ancillary and laboratory) are listed below for reference.     Procedures and Diagnostic Studies:   Mr Brain Wo Contrast  Result Date: 10/31/2018 CLINICAL DATA:  Syncope. Episode of blurred vision. Syncopal episode associated with bowel movement. EXAM: MRI HEAD WITHOUT CONTRAST TECHNIQUE: Multiplanar, multiecho  pulse sequences of the brain and surrounding structures were obtained without intravenous contrast. COMPARISON:  None. FINDINGS: Brain: No acute infarct, hemorrhage, or mass lesion is present. The lacunar infarct of the right centrum semi ovale is remote. There is a remote lacunar infarct in the left lentiform  nucleus as well. Susceptibility weighted images demonstrate multiple punctate foci of susceptibility bilaterally. No acute hemorrhage is present. Moderate generalized white matter disease is present. Moderate periventricular T2 signal changes are present. Scattered subcortical T2 hyperintensities are noted as well. White matter changes extend into the brainstem. Cerebellum is within normal limits. The ventricles are of proportionate to the degree of atrophy. Vascular: Flow is present in the major intracranial arteries. Skull and upper cervical spine: The craniocervical junction is normal. Upper cervical spine is within normal limits. Marrow signal is unremarkable. Sinuses/Orbits: The paranasal sinuses and mastoid air cells are clear. The globes and orbits are within normal limits. IMPRESSION: 1. No acute abnormality to explain syncopal episode. 2. Remote lacunar infarcts involving the right centrum semi ovale and the left basal ganglia. 3. Moderate atrophy and white matter disease likely reflects the sequela of chronic microvascular ischemia. 4. Multiple punctate foci of susceptibility consistent with remote foci of punctate hemorrhage suggest an underlying vasculitis. No acute hemorrhage is present. Electronically Signed   By: San Morelle M.D.   On: 10/31/2018 04:59   Dg Chest Portable 1 View  Result Date: 10/30/2018 CLINICAL DATA:  Syncope EXAM: PORTABLE CHEST 1 VIEW COMPARISON:  06/21/2018 and prior radiographs FINDINGS: Cardiomegaly again noted. Defibrillator/pacing pads overlying the chest noted. There is no evidence of focal airspace disease, pulmonary edema, suspicious pulmonary nodule/mass, pleural effusion, or pneumothorax. No acute bony abnormalities are identified. IMPRESSION: Cardiomegaly without evidence of acute cardiopulmonary disease. Electronically Signed   By: Margarette Canada M.D.   On: 10/30/2018 14:57     Labs:   Basic Metabolic Panel: Recent Labs  Lab 10/30/18 1424 10/30/18  1724 10/31/18 0631  NA 139  --  140  K 6.1* 4.9 4.2  CL 103  --  108  CO2 23  --  26  GLUCOSE 158*  --  106*  BUN 27*  --  24*  CREATININE 1.88*  --  1.38*  CALCIUM 9.1  --  8.6*   GFR Estimated Creatinine Clearance: 53 mL/min (A) (by C-G formula based on SCr of 1.38 mg/dL (H)). Liver Function Tests: Recent Labs  Lab 10/30/18 1424  AST 37  ALT 21  ALKPHOS 62  BILITOT 1.4*  PROT 7.0  ALBUMIN 3.9   No results for input(s): LIPASE, AMYLASE in the last 168 hours. No results for input(s): AMMONIA in the last 168 hours. Coagulation profile No results for input(s): INR, PROTIME in the last 168 hours.  CBC: Recent Labs  Lab 10/30/18 1424  WBC 7.6  NEUTROABS 6.2  HGB 12.3*  HCT 39.1  MCV 92.0  PLT 180   Cardiac Enzymes: Recent Labs  Lab 10/30/18 1424  TROPONINI <0.03   BNP: Invalid input(s): POCBNP CBG: No results for input(s): GLUCAP in the last 168 hours. D-Dimer No results for input(s): DDIMER in the last 72 hours. Hgb A1c No results for input(s): HGBA1C in the last 72 hours. Lipid Profile No results for input(s): CHOL, HDL, LDLCALC, TRIG, CHOLHDL, LDLDIRECT in the last 72 hours. Thyroid function studies No results for input(s): TSH, T4TOTAL, T3FREE, THYROIDAB in the last 72 hours.  Invalid input(s): FREET3 Anemia work up No results for input(s): VITAMINB12, FOLATE, FERRITIN, TIBC, IRON,  RETICCTPCT in the last 72 hours. Microbiology No results found for this or any previous visit (from the past 240 hour(s)).   Discharge Instructions:   Discharge Instructions    Diet - low sodium heart healthy   Complete by:  As directed    Diet Carb Modified   Complete by:  As directed    Discharge instructions   Complete by:  As directed    HR low-- have decreased your metoprolol   Increase activity slowly   Complete by:  As directed      Allergies as of 10/31/2018      Reactions   Aspirin Other (See Comments)   History of "stomach ulcers," was told to not  take this       Medication List    TAKE these medications   alfuzosin 10 MG 24 hr tablet Commonly known as:  UROXATRAL Take 10 mg by mouth daily.   amiodarone 200 MG tablet Commonly known as:  PACERONE Take 200 mg by mouth daily.   apixaban 5 MG Tabs tablet Commonly known as:  ELIQUIS Take 1 tablet (5 mg total) by mouth 2 (two) times daily. What changed:  how much to take   atorvastatin 20 MG tablet Commonly known as:  Lipitor Take 1 tablet (20 mg total) by mouth daily.   CALTRATE 600 PLUS-VIT D PO Take 1 tablet by mouth daily.   furosemide 40 MG tablet Commonly known as:  LASIX Take 1 tablet (40 mg total) by mouth daily. You may take an extra tablet as needed for shortness of breath and swelling What changed:    how much to take  when to take this  additional instructions   gabapentin 100 MG capsule Commonly known as:  Neurontin Take 1-2 capsules (100-200 mg total) by mouth See admin instructions for 30 days. Take one capsule (100 mg) by mouth every morning and two capsules (200 mg) at night   GLUCOSAMINE-CHONDROITIN PO Take 1 tablet by mouth 2 (two) times a day.   lisinopril 20 MG tablet Commonly known as:  ZESTRIL Take 1 tablet (20 mg total) by mouth daily. What changed:  when to take this   metFORMIN 500 MG tablet Commonly known as:  Glucophage Take 1 tablet (500 mg total) by mouth 2 (two) times daily with a meal.   metoprolol succinate 25 MG 24 hr tablet Commonly known as:  TOPROL-XL Take 2 tablets (50 mg total) by mouth daily. Take with or immediately following a meal. What changed:  medication strength   potassium chloride SA 20 MEQ tablet Commonly known as:  K-DUR Take 1 tablet (20 mEq total) by mouth daily as needed for up to 20 days (Take only if taking furosemide (Lasix)). What changed:  when to take this   spironolactone 25 MG tablet Commonly known as:  ALDACTONE Take 25 mg by mouth daily.   Systane Complete 0.6 % Soln Generic drug:   Propylene Glycol Place 1-2 drops into both eyes 3 (three) times daily as needed (for dryness).      Follow-up Information    [name], MD Follow up in 1 week(s).   Specialty:  Internal Medicine Why:  check BP/HR and BMP Contact information: [facility] [place] [address] [phone]        Sherren Mocha, MD .   Specialty:  Cardiology Contact information: [address] [place] [address] [phone]            Time coordinating discharge:  25 min  Signed:  Geradine Girt DO  [facility] 10/31/2018, 10:16 AM

## 2018-10-31 NOTE — TOC Transition Note (Signed)
Transition of Care [facility]) - CM/SW Discharge Note   Patient Details  Name: Seward Coran MRN: [id number] Date of Birth: [birth year]  [facility] CM/SW Contact:  Pollie Friar, RN Phone Number: 10/31/2018, 11:23 AM   Clinical Narrative:    Pt discharging home with self care. Pt has transportation home.  Pt's daughter or exwife can assist with transport as needed. Pt denies issues with home meds.   Final next level of care: Home/Self Care Barriers to Discharge: No Barriers Identified   Patient Goals and CMS Choice Patient states their goals for this hospitalization and ongoing recovery are:: to get back home      Discharge Placement                       Discharge Plan and Services                                     Social Determinants of Health (SDOH) Interventions     Readmission Risk Interventions Readmission Risk Prevention Plan 10/31/2018  Transportation Screening Complete  PCP or Specialist Appt within 3-5 Days Complete  HRI or [place] Not Complete  HRI or Home Care Consult comments no needs  Social Work Consult for [place] Planning/Counseling Not Complete  SW consult not completed comments no needs  Palliative Care Screening Not Applicable  Medication Review (RN Transport planner) Referral to Pharmacy  Some recent data might be hidden

## 2018-11-03 ENCOUNTER — Telehealth: Payer: Self-pay | Admitting: Physician Assistant

## 2018-11-03 LAB — GLUCOSE, CAPILLARY
Glucose-Capillary: 197 mg/dL — ABNORMAL HIGH (ref 70–99)
Glucose-Capillary: 110 mg/dL — ABNORMAL HIGH (ref 70–99)
Glucose-Capillary: 110 mg/dL — ABNORMAL HIGH (ref 70–99)
Glucose-Capillary: 125 mg/dL — ABNORMAL HIGH (ref 70–99)

## 2018-11-03 NOTE — Telephone Encounter (Signed)
VIDEO/Doxy.me visit on 11/08/2018     Phone Call to obtain consent    -   11/03/2018         Virtual Visit Pre-Appointment Phone Call  "(Name), I am calling you today to discuss your upcoming appointment. We are currently trying to limit exposure to the virus that causes COVID-19 by seeing patients at home rather than in the office."  1. "What is the BEST phone number to call the day of the visit?" - include this in appointment notes  2. Do you have or have access to (through a family member/friend) a smartphone with video capability that we can use for your visit?" a. If yes - list this number in appt notes as cell (if different from BEST phone #) and list the appointment type as a VIDEO visit in appointment notes b. If no - list the appointment type as a PHONE visit in appointment notes  3. Confirm consent - "In the setting of the current Covid19 crisis, you are scheduled for a (phone or video) visit with your provider on (date) at (time).  Just as we do with many in-office visits, in order for you to participate in this visit, we must obtain consent.  If you'd like, I can send this to your mychart (if signed up) or email for you to review.  Otherwise, I can obtain your verbal consent now.  All virtual visits are billed to your insurance company just like a normal visit would be.  By agreeing to a virtual visit, we'd like you to understand that the technology does not allow for your provider to perform an examination, and thus may limit your provider's ability to fully assess your condition. If your provider identifies any concerns that need to be evaluated in person, we will make arrangements to do so.  Finally, though the technology is pretty good, we cannot assure that it will always work on either your or our end, and in the setting of a video visit, we may have to convert it to a phone-only visit.  In either situation, we cannot ensure that we have a secure connection.  Are you  willing to proceed?" STAFF: Did the patient verbally acknowledge consent to telehealth visit? Document YES/NO here: YES  4. Advise patient to be prepared - "Two hours prior to your appointment, go ahead and check your blood pressure, pulse, oxygen saturation, and your weight (if you have the equipment to check those) and write them all down. When your visit starts, your provider will ask you for this information. If you have an Apple Watch or Kardia device, please plan to have heart rate information ready on the day of your appointment. Please have a pen and paper handy nearby the day of the visit as well."  5. Give patient instructions for MyChart download to smartphone OR Doximity/Doxy.me as below if video visit (depending on what platform provider is using)  6. Inform patient they will receive a phone call 15 minutes prior to their appointment time (may be from unknown caller ID) so they should be prepared to answer    TELEPHONE CALL NOTE  [name] has been deemed a candidate for a follow-up tele-health visit to limit community exposure during the Covid-19 pandemic. I spoke with the patient via phone to ensure availability of phone/video source, confirm preferred email & phone number, and discuss instructions and expectations.  I reminded [name] to be prepared with any vital sign and/or heart rhythm information  that could potentially be obtained via home monitoring, at the time of his visit. I reminded [name] to expect a phone call prior to his visit.  Thayer Headings 11/03/2018 11:13 AM   INSTRUCTIONS FOR DOWNLOADING THE MYCHART APP TO SMARTPHONE  - The patient must first make sure to have activated MyChart and know their login information - If Apple, go to [employer] and type in MyChart in the search bar and download the app. If Android, ask patient to go to [employer] and type in [place] in the search bar and download the app. The app is free but as with any other app  downloads, their phone may require them to verify saved payment information or Apple/Android password.  - The patient will need to then log into the app with their MyChart username and password, and select [facility] as their healthcare provider to link the account. When it is time for your visit, go to the MyChart app, find appointments, and click Begin Video Visit. Be sure to Select Allow for your device to access the Microphone and Camera for your visit. You will then be connected, and your provider will be with you shortly.  **If they have any issues connecting, or need assistance please contact MyChart service desk [phone])**  **If using a computer, in order to ensure the best quality for their visit they will need to use either of the following Internet Browsers: [employer], or Google Chrome**  IF USING DOXIMITY or DOXY.ME - The patient will receive a link just prior to their visit by text.     FULL LENGTH CONSENT FOR TELE-HEALTH VISIT   I hereby voluntarily request, consent and authorize [facility] and its employed or contracted physicians, physician assistants, nurse practitioners or other licensed health care professionals (the Practitioner), to provide me with telemedicine health care services (the Services") as deemed necessary by the treating Practitioner. I acknowledge and consent to receive the Services by the Practitioner via telemedicine. I understand that the telemedicine visit will involve communicating with the Practitioner through live audiovisual communication technology and the disclosure of certain medical information by electronic transmission. I acknowledge that I have been given the opportunity to request an in-person assessment or other available alternative prior to the telemedicine visit and am voluntarily participating in the telemedicine visit.  I understand that I have the right to withhold or withdraw my consent to the use of telemedicine  in the course of my care at any time, without affecting my right to future care or treatment, and that the Practitioner or I may terminate the telemedicine visit at any time. I understand that I have the right to inspect all information obtained and/or recorded in the course of the telemedicine visit and may receive copies of available information for a reasonable fee.  I understand that some of the potential risks of receiving the Services via telemedicine include:   Delay or interruption in medical evaluation due to technological equipment failure or disruption;  Information transmitted may not be sufficient (e.g. poor resolution of images) to allow for appropriate medical decision making by the Practitioner; and/or   In rare instances, security protocols could fail, causing a breach of personal health information.  Furthermore, I acknowledge that it is my responsibility to provide information about my medical history, conditions and care that is complete and accurate to the best of my ability. I acknowledge that Practitioner's advice, recommendations, and/or decision may be based on factors not within their  control, such as incomplete or inaccurate data provided by me or distortions of diagnostic images or specimens that may result from electronic transmissions. I understand that the practice of medicine is not an exact science and that Practitioner makes no warranties or guarantees regarding treatment outcomes. I acknowledge that I will receive a copy of this consent concurrently upon execution via email to the email address I last provided but may also request a printed copy by calling the office of [facility].    I understand that my insurance will be billed for this visit.   I have read or had this consent read to me.  I understand the contents of this consent, which adequately explains the benefits and risks of the Services being provided via telemedicine.   I have been provided ample  opportunity to ask questions regarding this consent and the Services and have had my questions answered to my satisfaction.  I give my informed consent for the services to be provided through the use of telemedicine in my medical care  By participating in this telemedicine visit I agree to the above.

## 2018-11-08 ENCOUNTER — Telehealth: Payer: Self-pay | Admitting: Physician Assistant

## 2018-11-08 ENCOUNTER — Telehealth (INDEPENDENT_AMBULATORY_CARE_PROVIDER_SITE_OTHER): Payer: Medicare Other | Admitting: Physician Assistant

## 2018-11-08 ENCOUNTER — Encounter: Payer: Self-pay | Admitting: Physician Assistant

## 2018-11-08 ENCOUNTER — Other Ambulatory Visit: Payer: Self-pay

## 2018-11-08 VITALS — BP 137/74 | HR 60 | Ht 71.0 in | Wt 238.0 lb

## 2018-11-08 DIAGNOSIS — N183 Chronic kidney disease, stage 3 unspecified: Secondary | ICD-10-CM

## 2018-11-08 DIAGNOSIS — R55 Syncope and collapse: Secondary | ICD-10-CM

## 2018-11-08 DIAGNOSIS — Z79899 Other long term (current) drug therapy: Secondary | ICD-10-CM

## 2018-11-08 DIAGNOSIS — I10 Essential (primary) hypertension: Secondary | ICD-10-CM

## 2018-11-08 DIAGNOSIS — I5022 Chronic systolic (congestive) heart failure: Secondary | ICD-10-CM

## 2018-11-08 DIAGNOSIS — Z7189 Other specified counseling: Secondary | ICD-10-CM

## 2018-11-08 DIAGNOSIS — I4819 Other persistent atrial fibrillation: Secondary | ICD-10-CM

## 2018-11-08 DIAGNOSIS — I340 Nonrheumatic mitral (valve) insufficiency: Secondary | ICD-10-CM

## 2018-11-08 NOTE — Telephone Encounter (Addendum)
[facility] Visit Initial Request  Agency Requested:    [facility] Contact:  Glory Buff, NP [place], [place] [address] Phone #:  [phone] Fax #:  [phone]  Patient Demographic Information: Name:  [name] Lessley Age:  84 y.o.   DOB:  February 25, 1935  MRN:  [id number]   Address:   [address] [place] [address]   Phone Numbers:   Home Phone [phone]  Mobile [phone]     Emergency Contact Information on File:   Contact Information    Name Relation Home Work Mobile   [place] Daughter   [phone]   [place], [name]   [phone]   Preciliano, Castell Daughter   [phone]      The above family members may be contacted for information on this patient (review DPR on file):  No - No DPR on file   Patient Clinical Information:  Primary Care Provider:  [name], MD  Primary Cardiologist:  Sherren Mocha, MD  Primary Electrophysiologist:  Thompson Grayer, MD   Requesting Provider:  [name], PA-C     Past Medical Hx: Mr. Marinos  has a past medical history of A-fib [facility]), Abnormal PSA (01/17/2012), Aneurysm ([facility]), Blood transfusion without reported diagnosis, Cancer ([place]) (2006), Chronic kidney disease, ED (erectile dysfunction), GERD (gastroesophageal reflux disease), Gynecomastia (04/21/11), H/O impacted cerumen, History of echocardiogram, cardiovascular stress test, Hydroureteronephrosis, Hyperglycemia, Hyperlipidemia, Hypertension, Insomnia, Migraines, Nephrolithiasis, Osteopenia, Overweight, Prostate cancer ([facility]) (12/09/03 dx), Prostate cancer ([facility]), RBBB (right bundle branch block), Rosacea, Sinus bradycardia, Sleep apnea, Stroke ([facility]), Ulcer, and Use of leuprolide acetate (Lupron) (06/2005-11/2006).   Allergies: He is allergic to aspirin.   Medications: Current Outpatient Medications on File Prior to Visit  Medication Sig  . alfuzosin (UROXATRAL) 10 MG 24 hr tablet Take 10 mg by mouth daily.   Marland Kitchen amiodarone (PACERONE) 200 MG tablet Take 200 mg by mouth daily.  Marland Kitchen apixaban (ELIQUIS) 5 MG TABS tablet Take 2.5 mg by mouth 2 (two) times daily.  Marland Kitchen atorvastatin (LIPITOR) 20 MG tablet Take 1 tablet (20 mg total) by mouth daily.  . furosemide (LASIX) 40 MG tablet Take 40 mg by mouth daily.  Marland Kitchen gabapentin (NEURONTIN) 100 MG capsule Take 300 mg by mouth daily.  Marland Kitchen GLUCOSAMINE-CHONDROITIN PO Take 1 tablet by mouth 2 (two) times a day.  . lisinopril (ZESTRIL) 20 MG tablet Take 20 mg by mouth 2 (two) times a day.  . metFORMIN (GLUCOPHAGE) 500 MG tablet Take 1 tablet (500 mg total) by mouth 2 (two) times daily with a meal.  . metoprolol succinate (TOPROL-XL) 50 MG 24 hr tablet Take 50 mg by mouth daily. Take with or immediately following a meal.  . nystatin cream (MYCOSTATIN) APP EXT AA BID  . potassium chloride SA (K-DUR) 20 MEQ tablet Take 20 mEq by mouth every other day.  Marland Kitchen Propylene Glycol (SYSTANE COMPLETE) 0.6 % SOLN Place 1-2 drops into both eyes 3 (three) times daily as needed (for dryness).  Marland Kitchen spironolactone (ALDACTONE) 25 MG tablet Take 25 mg by mouth daily.    No current facility-administered medications on file prior to visit.      Social Hx: He  reports that he quit smoking about 34 years ago. His smoking use included pipe. He has never used smokeless tobacco. He reports that he does not drink alcohol or use drugs.    Diagnosis/Reason for Visit:   CKD, AFib on Amiodarone - Patient high risk for COVID-19; needs follow  up labs  Services Requested:  Labs:  BMET, TSH  # of Visits Needed/Frequency per Week: 1   A copy of the office note will be faxed with this form.  All labs ordered for this home visit have been released.

## 2018-11-08 NOTE — Progress Notes (Signed)
Virtual Visit via Video Note   This visit type was conducted due to national recommendations for restrictions regarding the COVID-19 Pandemic (e.g. social distancing) in an effort to limit this patient's exposure and mitigate transmission in our community.  Due to his co-morbid illnesses, this patient is at least at moderate risk for complications without adequate follow up.  This format is felt to be most appropriate for this patient at this time.  All issues noted in this document were discussed and addressed.  A limited physical exam was performed with this format.  Please refer to the patient's chart for his consent to telehealth for [facility].   Date:  11/08/2018   ID:  [name], DOB [birth year], MRN [id number]  Patient Location: Home Provider Location: Home  PCP:  [name], MD  Cardiologist:  Sherren Mocha, MD   Electrophysiologist:  Thompson Grayer, MD   Evaluation Performed:  Follow-Up Visit  Chief Complaint: Posthospitalization follow-up, admitted for syncope  History of Present Illness:    [name] is a 84 y.o. male with prior hx of syncope in 2016 felt to be vasovagal in nature, NSVT managed conservatively due to hx of normal LVEF, diastolic CHF, pulmonary hypertension, diabetes, hypertension, prior stroke, sleep apnea, prostate cancer, dementia, RBBB.  He was admitted in 06/2018 with new onset systolic CHF in the setting of AFib with RVR.  He was placed on Amiodarone, Eliquis and underwent TEE guided DCCV.  Of note, he had severe mitral regurgitation noted on transthoracic and trans-esophageal echocardiogram.  He was last seen in clinic by Daune Perch, NP in 08/2018.  He was maintaining normal sinus rhythm at that time.  Follow-up echo in March 2020 demonstrated improved LV function with an EF of 50-55, moderate MR.  He was admitted 4/27-4/28 with syncope.  Notes indicate he was working outside when the syncopal episode occurred.  He was hypotensive with systolic  blood pressures in the 70s and his creatinine had increased to 1.88.  Medications were held and he had improvement with IV fluids.  ECG did not demonstrate any significant change from his prior tracing.  He was noted to have sinus bradycardia with heart rate in the 50s.  Cardiac enzymes were negative.  MRI did not demonstrate anything acute.  He had remote lacunar infarcts, moderate atrophy and evidence of remote foci of punctate hemorrhage suggesting an underlying vasculitis but no acute hemorrhage.  Creatinine improved to 1.38 prior to discharge.  His syncope was thought to be vasovagal.  Of note, his lisinopril, metoprolol, spironolactone and furosemide were continued at discharge.  Today, he notes he is doing well.  He has not had any further syncope or near syncope.  He remains quite active and works on his house doing some repair work.  On the day he passed out, he was outside working for a long time.  He went inside and had diarrhea.  Shortly after this, he passed out.  He has not had any chest discomfort, significant shortness of breath, orthopnea or lower extremity swelling.  Of note, his Apixaban was reduced to 2.5 mg twice daily several weeks ago due to recurrent epistaxis.  He has not had any epistaxis since that time.  The patient does not have symptoms concerning for COVID-19 infection (fever, chills, cough, or new shortness of breath).    Past Medical History:  Diagnosis Date  . A-fib ([place])    RVR-DCCV  . Abnormal PSA 01/17/2012   10.38/4/5/ PSA:2013=6.96/ PSA 07/08/2011=6.26/ PSA 2006=8.6  .  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 Blood transfusion without reported diagnosis    during vagotomy 1976  . Cancer [facility]) 2006   L-2 solitary metastatic lesion/no rad tx ,asymptomatic  . Chronic kidney disease    nephrolithiasis right kidney  . ED (erectile dysfunction)   . GERD (gastroesophageal reflux disease)    PUD S/P PARTIAL VAGOTOMY  . Gynecomastia 04/21/11   seen  by Dr.Murray, no rad tx  . H/O impacted cerumen    S/P ENT DR BATES  . History of echocardiogram    echo 4/16:  mild LVH, EF 55-60%, Gr 1 DD, no RWMA, mild AI, mild MR, mod LAE, mild TR  . Hx of cardiovascular stress test    Myoview 5/16:  EF 50%, no scar or ischemia, low risk  . Hydroureteronephrosis    right   . Hyperglycemia    WITHOUT DM2  . Hyperlipidemia   . Hypertension   . Insomnia   . Migraines    WITH AURA  . Nephrolithiasis   . Osteopenia   . Overweight   . Prostate cancer ([place]) 12/09/03 dx   Prostate ca/adenocarcinoma,gleason=3+3=6    pSA 4.6  . Prostate cancer ([place])    METASTATIC WITH L2 SCLEROTIC LESION ON LUPRON------DR. WOODRUFF  . RBBB (right bundle branch block)   . Rosacea   . Sinus bradycardia   . Sleep apnea    pt unaware  . Stroke [facility])    asymptomatic, found on head imaging  . Ulcer    peptic  . Use of leuprolide acetate (Lupron) 06/2005-11/2006   02/2008 degarelix 02/2008 x 3 doses   Past Surgical History:  Procedure Laterality Date  . CARDIOVERSION N/A 06/19/2018   Procedure: CARDIOVERSION;  Surgeon: Larey Dresser, MD;  Location: [facility] ENDOSCOPY;  Service: Cardiovascular;  Laterality: N/A;  . PROSTATE BIOPSY  12/09/03   adenocarcinoma,glerason: 3+3=6  . TEE WITHOUT CARDIOVERSION N/A 06/19/2018   Procedure: TRANSESOPHAGEAL ECHOCARDIOGRAM (TEE);  Surgeon: Larey Dresser, MD;  Location: [facility] ENDOSCOPY;  Service: Cardiovascular;  Laterality: N/A;  . VAGOTOMY  1978   partial     Current Meds  Medication Sig  . alfuzosin (UROXATRAL) 10 MG 24 hr tablet Take 10 mg by mouth daily.  Marland Kitchen amiodarone (PACERONE) 200 MG tablet Take 200 mg by mouth daily.  Marland Kitchen apixaban (ELIQUIS) 5 MG TABS tablet Take 2.5 mg by mouth 2 (two) times daily.  Marland Kitchen atorvastatin (LIPITOR) 20 MG tablet Take 1 tablet (20 mg total) by mouth daily.  . furosemide (LASIX) 40 MG tablet Take 40 mg by mouth daily.  Marland Kitchen gabapentin (NEURONTIN) 100 MG capsule Take 300 mg by mouth daily.  Marland Kitchen  GLUCOSAMINE-CHONDROITIN PO Take 1 tablet by mouth 2 (two) times a day.  . lisinopril (ZESTRIL) 20 MG tablet Take 20 mg by mouth 2 (two) times a day.  . metFORMIN (GLUCOPHAGE) 500 MG tablet Take 1 tablet (500 mg total) by mouth 2 (two) times daily with a meal.  . metoprolol succinate (TOPROL-XL) 50 MG 24 hr tablet Take 50 mg by mouth daily. Take with or immediately following a meal.  . nystatin cream (MYCOSTATIN) APP EXT AA BID  . potassium chloride SA (K-DUR) 20 MEQ tablet Take 20 mEq by mouth every other day.  Marland Kitchen Propylene Glycol (SYSTANE COMPLETE) 0.6 % SOLN Place 1-2 drops into both eyes 3 (three) times daily as needed (for dryness).  Marland Kitchen spironolactone (ALDACTONE) 25 MG tablet Take 25 mg by mouth daily.  Allergies:   Aspirin   Social History   Tobacco Use  . Smoking status: Former Smoker    Types: Pipe    Last attempt to quit: 07/05/1984    Years since quitting: 34.3  . Smokeless tobacco: Never Used  Substance Use Topics  . Alcohol use: No  . Drug use: No     Family Hx: The patient's family history includes Kidney Stones in his father; Liver cancer in his father. There is no history of Colon cancer or Stroke.  ROS:   Please see the history of present illness.    All other systems reviewed and are negative.   Prior CV studies:   The following studies were reviewed today:  Echo 09/07/2018 EF 50-55, moderate LVH, normal diastolic function, moderate LAE, moderate MAC, moderate MR, mild to moderate AI  TEE 06/19/2018 - 1. No LA thrombus, may proceed to DCCV.   2. Severe LV dilation with EF 25%, diffuse hypokinesis.   3. Mild RV dilation with moderate systolic dysfunction.   4. Severe MR with restriction of posterior leaflet and evidence   for a ruptured chord.  Echocardiogram 06/17/2018 Mild focal basal septal hypertrophy, EF 20, diffuse HK with severe inferior HK, mild AI, mild MAC, severe MR, mild LAE, moderate to severe RVE, mild RAE, moderate TR, PASP 58   Echocardiogram 04/29/2018 Moderate LVH, EF 40-10, grade 2 diastolic dysfunction, mild AI, moderate MR, severe LAE, mild RAE, PASP 75  Myoview 11/27/2014 EF 50, no ischemia  Event monitor 10/24/2014 Sinus rhythm, PVCs, NSVT  Echocardiogram 10/22/2014 Mild concentric LVH, EF 55-60, normal wall motion, grade 1 diastolic dysfunction, mild AI, mild MR, moderate LAE, mild TR  Carotid US 10/22/2014 Bilateral ICA 1-39  Labs/Other Tests and Data Reviewed:    Brain MRI 10/31/2018 IMPRESSION: 1. No acute abnormality to explain syncopal episode. 2. Remote lacunar infarcts involving the right centrum semi ovale and the left basal ganglia. 3. Moderate atrophy and white matter disease likely reflects the sequela of chronic microvascular ischemia. 4. Multiple punctate foci of susceptibility consistent with remote foci of punctate hemorrhage suggest an underlying vasculitis. No acute hemorrhage is present.   EKG:  An ECG dated 10/30/2018 was personally reviewed today and demonstrated:  Sinus bradycardia, heart rate 51, right bundle branch block, left anterior fascicular block, QTC 522, no significant change when compared to prior tracing dated 08/10/2018  Recent Labs: 04/28/2018: Magnesium 2.3 06/16/2018: TSH 1.859 08/10/2018: NT-Pro BNP 942 10/30/2018: ALT 21; Hemoglobin 12.3; Platelets 180 10/31/2018: B Natriuretic Peptide 126.8; BUN 24; Creatinine, Ser 1.38; Potassium 4.2; Sodium 140   Recent Lipid Panel Lab Results  Component Value Date/Time   CHOL 118 07/20/2015 05:29 AM   TRIG 86 07/20/2015 05:29 AM   HDL 35 (L) 07/20/2015 05:29 AM   CHOLHDL 3.4 07/20/2015 05:29 AM   LDLCALC 66 07/20/2015 05:29 AM     Wt Readings from Last 3 Encounters:  11/08/18 238 lb (108 kg)  10/31/18 242 lb 15.2 oz (110.2 kg)  08/10/18 241 lb 12.8 oz (109.7 kg)     Objective:    Vital Signs:  BP 137/74   Pulse 60   Ht _0  (1.803 m)   Wt 238 lb (108 kg)   BMI 33.19 kg/m    VITAL SIGNS:  reviewed GEN:   no acute distress EYES:  sclerae anicteric, EOMI - Extraocular Movements Intact RESPIRATORY:  Normal respiratory effort NEURO:  alert and oriented x 3, no obvious focal deficit PSYCH:  normal affect  ASSESSMENT &  PLAN:    Vasovagal syncope Recent admission to the hospital with syncope.  His syndrome is consistent with vasovagal syncope.  He had been working outside on a warmer day and also had an episode of diarrhea.  His creatinine was elevated upon presentation to the hospital and he had low blood pressure.  His symptoms improved with IV fluids.  Recent echocardiogram demonstrated normal LV function.  I reviewed his cardiac telemetry from the hospital and it did not demonstrate any significant arrhythmias.  At this point, I do not think he needs an event monitor.  Chronic systolic CHF (congestive heart failure) ([place]) He has a history of tachycardia induced cardiomyopathy.  His EF was in the 20% range at the time of his admission for atrial fibrillation with rapid rate.  His EF has returned to normal.  Continue beta-blocker, ACE inhibitor, spironolactone.  Given his recent admission to the hospital, he likely does not need as much diuretic as previous.  Therefore, I have asked him to reduce his Lasix to 20 mg daily and potassium to 10 mEq every other day.  CKD (chronic kidney disease) stage 3, GFR 30-59 ml/min (HCC) Creatinine prior to discharge had improved.  I will arrange a follow-up BMET.  Other persistent atrial fibrillation He is currently only taking Apixaban 2.5 mg twice daily.  Given his recent creatinine of 1.38 and his weight >60 mg, he should be on 5 mg twice daily.  I will arrange a follow-up BMET to confirm that his creatinine remains <1.5.  If this is the case, I will increase his Apixaban back to 5 mg twice daily.  If he has recurrent epistaxis, we may need to consider changing him to a different oral anticoagulant.  Recent LFTs were normal.  He remains on amiodarone.  I will  obtain a TSH with his upcoming lab work.  Mitral valve insufficiency, unspecified etiology Moderate by recent echocardiogram.  He is asymptomatic.  Essential hypertension Fair control.  Continue current therapy.  Educated About Covid-19 Virus Infection The signs and symptoms of COVID-19 were discussed with the patient and how to seek care for testing (follow up with PCP or arrange E-visit).  The importance of social distancing was discussed today.  Time:   Today, I have spent 28 minutes with the patient with telehealth technology discussing the above problems.     Medication Adjustments/Labs and Tests Ordered: Current medicines are reviewed at length with the patient today.  Concerns regarding medicines are outlined above.   Tests Ordered: No orders of the defined types were placed in this encounter.   Medication Changes: No orders of the defined types were placed in this encounter.   Disposition:  Follow up in 6-8 week(s)  Signed, [name], PA-C  11/08/2018 2:16 PM    [facility]

## 2018-11-08 NOTE — Patient Instructions (Signed)
Medication Instructions:   Decrease Lasix (furosemide) to 20 mg daily  Decrease potassium to 10 mEq every other day   If you need a refill on your cardiac medications before your next appointment, please call your pharmacy.   Lab work:  I will arrange a home visit to obtain labs (BMET, TSH)  If you have labs (blood work) drawn today and your tests are completely normal, you will receive your results only by: Marland Kitchen MyChart Message (if you have MyChart) OR . A paper copy in the mail If you have any lab test that is abnormal or we need to change your treatment, we will call you to review the results.  Testing/Procedures: None  Follow-Up: At [facility], you and your health needs are our priority.  As part of our continuing mission to provide you with exceptional heart care, we have created designated Provider Care Teams.  These Care Teams include your primary Cardiologist (physician) and Advanced Practice Providers (APPs -  Physician Assistants and Nurse Practitioners) who all work together to provide you with the care you need, when you need it. You will need a follow up appointment in:  6 weeks.  Please call our office 2 months in advance to schedule this appointment.  You may see Sherren Mocha, MD or [name], PA-C   Any Other Special Instructions Will Be Listed Below (If Applicable).  I will arrange a home visit to obtain labs.  If her lab work continues to demonstrate that your kidney function is improved, I will adjust your Eliquis to the higher dose.

## 2018-11-09 NOTE — Addendum Note (Signed)
Addended byKathlen Mody, Nicki Reaper T on: 11/09/2018 08:38 PM   Modules accepted: Orders

## 2018-11-09 NOTE — Telephone Encounter (Signed)
Label Here   [place] Page 1 of 1  Account #: [id number] Collection Date:       Req/Control #: [id number] Collection Time:       Client / Ordering Site Information: Physician Information:  Account Name: [facility]  Address 1: [address]  Address 2:   [place], [place] Zip: [place], [place] [address]  Phone: [phone]   Ordering: Liliane Shi  Degree: PA-C  NPI: [id number]  UPIN: [id number]  Physician ID:       Patient Information:  Name: [name],[name]  Gender: Male  Date of Birth: 29-Apr-1935  Age: 84 years  Address: [place], [place] Zip: [place], [place] [address]   SSN: [id number]  Patient ID: [id number]  Phone: [phone]     Alt Control#: [id number]           Comments:      Order Code Tests Ordered (Total: 2)   Order Code Tests Ordered    544920 TSH   100712 Basic metabolic panel          Diagnosis Codes:  I48.19 Z79.899 N18.3       Bill Type: Third-Party   Carrier Code          Responsible Party / Guarantor Information:  Name:   Address:   [place], [place] Zip:   Phone:   Relation to Pt:   Employer Name:       ABN:    Worker's Comp:  Date of Injury:         Insurance underwriter Information:  Primary Insurance: Secondary Insurance:  Ins Co Name: [facility] MEDICARE  Address 1: [address] [address]  Address 2:   [place] [address] [place] [address]  Policy Number: [id number]  Group #: [id number]   Ins Co Name:   Address 1:   Address 2:   [place], [place] Zip:   Policy Number:   Group #:     Producer, television/film/video / Insured: Pharmacologist / Insured:  Name: Robeck,Juancarlos  Address: [address]   [place], [place] [address]  Pt Relation to Subscriber: Self   Name:   Address:      Pt Relation to Subscriber:       Authorization - Please Sign and Date I hereby authorize the release of medical information related to the  services described hereon and authorize payment directly to [employer].   __________________________________                    ______________ Patient Signature                                                                    Date   __________________________________                     ______________ Physician Signature  Date     [name],[name]  26-Apr-1935 [id number]  MRN: [id number]  Coll Dt/Time: -    [name],[name]  1935/05/13 [id number]  MRN: [id number]  Coll Dt/Time: -    [name],[name]  1935/04/27 [id number]  MRN: [id number]  Coll Dt/Time: -    [name],[name]  12-Apr-1935 [id number]  MRN: [id number]  Coll Dt/Time: -     [name],[name]  [birth year] [id number]  MRN: [id number]  Coll Dt/Time: -    [name],[name]  08/20/1934 [id number]  MRN: [id number]  Coll Dt/Time: -    [name],[name]  1934-10-31 [id number]  MRN: [id number]  Coll Dt/Time: -    [name],[name]  1935-06-15 [id number]  MRN: [id number]  Coll Dt/Time: -

## 2018-11-11 LAB — BASIC METABOLIC PANEL
BUN/Creatinine Ratio: 22 (ref 10–24)
BUN: 22 mg/dL (ref 8–27)
CO2: 24 mmol/L (ref 20–29)
Calcium: 9 mg/dL (ref 8.6–10.2)
Chloride: 104 mmol/L (ref 96–106)
Creatinine, Ser: 0.98 mg/dL (ref 0.76–1.27)
GFR calc Af Amer: 83 mL/min/{1.73_m2} (ref >59)
GFR calc non Af Amer: 72 mL/min/{1.73_m2} (ref >59)
Glucose: 234 mg/dL — ABNORMAL HIGH (ref 65–99)
Potassium: 5.2 mmol/L (ref 3.5–5.2)
Sodium: 143 mmol/L (ref 134–144)

## 2018-11-11 LAB — TSH: TSH: 4.09 u[IU]/mL (ref 0.450–4.500)

## 2018-11-13 NOTE — Progress Notes (Signed)
See labs dated 5.8.2020 Yandiel Bergum, PA-C    11/13/2018 5:44 PM

## 2018-11-15 ENCOUNTER — Telehealth: Payer: Self-pay | Admitting: *Deleted

## 2018-11-15 NOTE — Telephone Encounter (Signed)
LVMOM OF RESULTS AND TO CALL CLINIC BACK IF ANY QUESTIONS

## 2019-04-22 ENCOUNTER — Other Ambulatory Visit: Payer: Self-pay

## 2019-04-22 ENCOUNTER — Inpatient Hospital Stay (HOSPITAL_COMMUNITY)
Admission: EM | Admit: 2019-04-22 | Discharge: 2019-04-26 | DRG: 286 | Disposition: A | Discharge status: Home / Self Care | Payer: Medicare Other | Attending: Internal Medicine | Admitting: Internal Medicine

## 2019-04-22 ENCOUNTER — Encounter (HOSPITAL_COMMUNITY): Payer: Self-pay | Admitting: Emergency Medicine

## 2019-04-22 ENCOUNTER — Emergency Department (HOSPITAL_COMMUNITY): Payer: Medicare Other

## 2019-04-22 DIAGNOSIS — I5022 Chronic systolic (congestive) heart failure: Secondary | ICD-10-CM | POA: Diagnosis not present

## 2019-04-22 DIAGNOSIS — I10 Essential (primary) hypertension: Secondary | ICD-10-CM | POA: Diagnosis present

## 2019-04-22 DIAGNOSIS — N1831 Chronic kidney disease, stage 3a: Secondary | ICD-10-CM | POA: Diagnosis not present

## 2019-04-22 DIAGNOSIS — I130 Hypertensive heart and chronic kidney disease with heart failure and stage 1 through stage 4 chronic kidney disease, or unspecified chronic kidney disease: Principal | ICD-10-CM | POA: Diagnosis present

## 2019-04-22 DIAGNOSIS — Z87442 Personal history of urinary calculi: Secondary | ICD-10-CM

## 2019-04-22 DIAGNOSIS — R778 Other specified abnormalities of plasma proteins: Secondary | ICD-10-CM | POA: Diagnosis not present

## 2019-04-22 DIAGNOSIS — N183 Chronic kidney disease, stage 3 unspecified: Secondary | ICD-10-CM | POA: Diagnosis present

## 2019-04-22 DIAGNOSIS — R531 Weakness: Secondary | ICD-10-CM

## 2019-04-22 DIAGNOSIS — Z87891 Personal history of nicotine dependence: Secondary | ICD-10-CM

## 2019-04-22 DIAGNOSIS — F329 Major depressive disorder, single episode, unspecified: Secondary | ICD-10-CM | POA: Diagnosis present

## 2019-04-22 DIAGNOSIS — E785 Hyperlipidemia, unspecified: Secondary | ICD-10-CM | POA: Diagnosis present

## 2019-04-22 DIAGNOSIS — I2721 Secondary pulmonary arterial hypertension: Secondary | ICD-10-CM

## 2019-04-22 DIAGNOSIS — R7989 Other specified abnormal findings of blood chemistry: Secondary | ICD-10-CM | POA: Diagnosis present

## 2019-04-22 DIAGNOSIS — R609 Edema, unspecified: Secondary | ICD-10-CM

## 2019-04-22 DIAGNOSIS — Z7901 Long term (current) use of anticoagulants: Secondary | ICD-10-CM

## 2019-04-22 DIAGNOSIS — E119 Type 2 diabetes mellitus without complications: Secondary | ICD-10-CM

## 2019-04-22 DIAGNOSIS — I2510 Atherosclerotic heart disease of native coronary artery without angina pectoris: Secondary | ICD-10-CM | POA: Diagnosis present

## 2019-04-22 DIAGNOSIS — R600 Localized edema: Secondary | ICD-10-CM

## 2019-04-22 DIAGNOSIS — E782 Mixed hyperlipidemia: Secondary | ICD-10-CM

## 2019-04-22 DIAGNOSIS — I480 Paroxysmal atrial fibrillation: Secondary | ICD-10-CM | POA: Diagnosis not present

## 2019-04-22 DIAGNOSIS — Z79899 Other long term (current) drug therapy: Secondary | ICD-10-CM

## 2019-04-22 DIAGNOSIS — Z8673 Personal history of transient ischemic attack (TIA), and cerebral infarction without residual deficits: Secondary | ICD-10-CM

## 2019-04-22 DIAGNOSIS — I452 Bifascicular block: Secondary | ICD-10-CM | POA: Diagnosis present

## 2019-04-22 DIAGNOSIS — N179 Acute kidney failure, unspecified: Secondary | ICD-10-CM | POA: Diagnosis present

## 2019-04-22 DIAGNOSIS — I160 Hypertensive urgency: Secondary | ICD-10-CM | POA: Diagnosis present

## 2019-04-22 DIAGNOSIS — Z886 Allergy status to analgesic agent status: Secondary | ICD-10-CM

## 2019-04-22 DIAGNOSIS — M8580 Other specified disorders of bone density and structure, unspecified site: Secondary | ICD-10-CM | POA: Diagnosis present

## 2019-04-22 DIAGNOSIS — Z20828 Contact with and (suspected) exposure to other viral communicable diseases: Secondary | ICD-10-CM | POA: Diagnosis present

## 2019-04-22 DIAGNOSIS — Z7984 Long term (current) use of oral hypoglycemic drugs: Secondary | ICD-10-CM

## 2019-04-22 DIAGNOSIS — Z800 Family history of malignant neoplasm of digestive organs: Secondary | ICD-10-CM

## 2019-04-22 DIAGNOSIS — T464X5A Adverse effect of angiotensin-converting-enzyme inhibitors, initial encounter: Secondary | ICD-10-CM | POA: Diagnosis present

## 2019-04-22 DIAGNOSIS — I5021 Acute systolic (congestive) heart failure: Secondary | ICD-10-CM | POA: Diagnosis present

## 2019-04-22 DIAGNOSIS — E1122 Type 2 diabetes mellitus with diabetic chronic kidney disease: Secondary | ICD-10-CM | POA: Diagnosis present

## 2019-04-22 DIAGNOSIS — Z8711 Personal history of peptic ulcer disease: Secondary | ICD-10-CM

## 2019-04-22 DIAGNOSIS — Z8546 Personal history of malignant neoplasm of prostate: Secondary | ICD-10-CM

## 2019-04-22 DIAGNOSIS — I4891 Unspecified atrial fibrillation: Secondary | ICD-10-CM | POA: Diagnosis present

## 2019-04-22 DIAGNOSIS — Z9114 Patient's other noncompliance with medication regimen: Secondary | ICD-10-CM

## 2019-04-22 DIAGNOSIS — K219 Gastro-esophageal reflux disease without esophagitis: Secondary | ICD-10-CM | POA: Diagnosis present

## 2019-04-22 DIAGNOSIS — I5043 Acute on chronic combined systolic (congestive) and diastolic (congestive) heart failure: Secondary | ICD-10-CM | POA: Diagnosis present

## 2019-04-22 LAB — MAGNESIUM: Magnesium: 1.9 mg/dL (ref 1.7–2.4)

## 2019-04-22 LAB — SARS CORONAVIRUS 2 (TAT 6-24 HRS): SARS Coronavirus 2: NEGATIVE

## 2019-04-22 LAB — HEPATIC FUNCTION PANEL
ALT: 24 U/L (ref 0–44)
AST: 25 U/L (ref 15–41)
Albumin: 3.8 g/dL (ref 3.5–5.0)
Alkaline Phosphatase: 87 U/L (ref 38–126)
Bilirubin, Direct: 0.2 mg/dL (ref 0.0–0.2)
Indirect Bilirubin: 0.6 mg/dL (ref 0.3–0.9)
Total Bilirubin: 0.8 mg/dL (ref 0.3–1.2)
Total Protein: 7.1 g/dL (ref 6.5–8.1)

## 2019-04-22 LAB — URINALYSIS, COMPLETE (UACMP) WITH MICROSCOPIC
Bacteria, UA: NONE SEEN
Bilirubin Urine: NEGATIVE
Glucose, UA: NEGATIVE mg/dL
Hgb urine dipstick: NEGATIVE
Ketones, ur: NEGATIVE mg/dL
Leukocytes,Ua: NEGATIVE
Nitrite: NEGATIVE
Protein, ur: NEGATIVE mg/dL
Specific Gravity, Urine: 1.012 (ref 1.005–1.030)
pH: 5 (ref 5.0–8.0)

## 2019-04-22 LAB — GLUCOSE, CAPILLARY
Glucose-Capillary: 133 mg/dL — ABNORMAL HIGH (ref 70–99)
Glucose-Capillary: 131 mg/dL — ABNORMAL HIGH (ref 70–99)

## 2019-04-22 LAB — CBC
HCT: 40.1 % (ref 39.0–52.0)
Hemoglobin: 12.8 g/dL — ABNORMAL LOW (ref 13.0–17.0)
MCH: 30 pg (ref 26.0–34.0)
MCHC: 31.9 g/dL (ref 30.0–36.0)
MCV: 93.9 fL (ref 80.0–100.0)
Platelets: 199 10*3/uL (ref 150–400)
RBC: 4.27 MIL/uL (ref 4.22–5.81)
RDW: 13.1 % (ref 11.5–15.5)
WBC: 6.9 10*3/uL (ref 4.0–10.5)
nRBC: 0 % (ref 0.0–0.2)

## 2019-04-22 LAB — BASIC METABOLIC PANEL
Anion gap: 11 (ref 5–15)
BUN: 20 mg/dL (ref 8–23)
CO2: 22 mmol/L (ref 22–32)
Calcium: 8.8 mg/dL — ABNORMAL LOW (ref 8.9–10.3)
Chloride: 107 mmol/L (ref 98–111)
Creatinine, Ser: 1.18 mg/dL (ref 0.61–1.24)
GFR calc Af Amer: >60 mL/min (ref >60)
GFR calc non Af Amer: 57 mL/min — ABNORMAL LOW (ref >60)
Glucose, Bld: 138 mg/dL — ABNORMAL HIGH (ref 70–99)
Potassium: 4.2 mmol/L (ref 3.5–5.1)
Sodium: 140 mmol/L (ref 135–145)

## 2019-04-22 LAB — TROPONIN I (HIGH SENSITIVITY)
Troponin I (High Sensitivity): 36 ng/L — ABNORMAL HIGH (ref <18)
Troponin I (High Sensitivity): 43 ng/L — ABNORMAL HIGH (ref <18)
Troponin I (High Sensitivity): 40 ng/L — ABNORMAL HIGH (ref <18)

## 2019-04-22 LAB — LIPID PANEL
Cholesterol: 129 mg/dL (ref 0–200)
HDL: 34 mg/dL — ABNORMAL LOW (ref >40)
LDL Cholesterol: 71 mg/dL (ref 0–99)
Total CHOL/HDL Ratio: 3.8 RATIO
Triglycerides: 118 mg/dL (ref <150)
VLDL: 24 mg/dL (ref 0–40)

## 2019-04-22 LAB — BRAIN NATRIURETIC PEPTIDE: B Natriuretic Peptide: 671.8 pg/mL — ABNORMAL HIGH (ref 0.0–100.0)

## 2019-04-22 LAB — PHOSPHORUS: Phosphorus: 3.6 mg/dL (ref 2.5–4.6)

## 2019-04-22 LAB — TSH: TSH: 1.485 u[IU]/mL (ref 0.350–4.500)

## 2019-04-22 MED ORDER — SODIUM CHLORIDE 0.9% FLUSH: 3.0000 mL | Freq: Once | INTRAVENOUS | Status: DC

## 2019-04-22 MED ORDER — ONDANSETRON HCL 4 MG/2ML IJ SOLN: 4.0000 mg | Freq: Four times a day (QID) | INTRAMUSCULAR | Status: DC | PRN

## 2019-04-22 MED ORDER — ACETAMINOPHEN 325 MG PO TABS: 650.0000 mg | ORAL_TABLET | Freq: Four times a day (QID) | ORAL | Status: DC | PRN

## 2019-04-22 MED ORDER — INSULIN ASPART 100 UNIT/ML ~~LOC~~ SOLN: 0.0000 [IU] | Freq: Every day | SUBCUTANEOUS | Status: DC

## 2019-04-22 MED ORDER — METOPROLOL SUCCINATE ER 25 MG PO TB24
25.0000 mg | ORAL_TABLET | Freq: Every morning | ORAL | Status: DC
  Administered 2019-04-23: 09:00:00 25 mg via ORAL
  Filled 2019-04-22: qty 1

## 2019-04-22 MED ORDER — FUROSEMIDE 10 MG/ML IJ SOLN
40.0000 mg | Freq: Once | INTRAMUSCULAR | Status: AC
  Administered 2019-04-22: 40 mg via INTRAVENOUS
  Filled 2019-04-22: qty 4

## 2019-04-22 MED ORDER — ACETAMINOPHEN 650 MG RE SUPP: 650.0000 mg | Freq: Four times a day (QID) | RECTAL | Status: DC | PRN

## 2019-04-22 MED ORDER — APIXABAN 2.5 MG PO TABS
2.5000 mg | ORAL_TABLET | Freq: Two times a day (BID) | ORAL | Status: DC
  Administered 2019-04-22 – 2019-04-24 (×4): 2.5 mg via ORAL
  Filled 2019-04-22 (×4): qty 1

## 2019-04-22 MED ORDER — METOPROLOL TARTRATE 5 MG/5ML IV SOLN
5.0000 mg | Freq: Once | INTRAVENOUS | Status: AC
  Administered 2019-04-22: 15:00:00 5 mg via INTRAVENOUS
  Filled 2019-04-22: qty 5

## 2019-04-22 MED ORDER — INSULIN ASPART 100 UNIT/ML ~~LOC~~ SOLN
0.0000 [IU] | Freq: Three times a day (TID) | SUBCUTANEOUS | Status: DC
  Administered 2019-04-22 – 2019-04-23 (×2): 2 [IU] via SUBCUTANEOUS
  Administered 2019-04-23: 09:00:00 3 [IU] via SUBCUTANEOUS
  Administered 2019-04-24 – 2019-04-25 (×2): 2 [IU] via SUBCUTANEOUS

## 2019-04-22 MED ORDER — ATORVASTATIN CALCIUM 10 MG PO TABS
20.0000 mg | ORAL_TABLET | Freq: Every day | ORAL | Status: DC
  Administered 2019-04-22 – 2019-04-26 (×4): 20 mg via ORAL
  Filled 2019-04-22 (×4): qty 2

## 2019-04-22 MED ORDER — ONDANSETRON HCL 4 MG PO TABS: 4.0000 mg | ORAL_TABLET | Freq: Four times a day (QID) | ORAL | Status: DC | PRN

## 2019-04-22 MED ORDER — SODIUM CHLORIDE 0.9 % IV SOLN
INTRAVENOUS | Status: DC
  Administered 2019-04-22: 13:00:00 via INTRAVENOUS

## 2019-04-22 MED ORDER — FUROSEMIDE 10 MG/ML IJ SOLN
40.0000 mg | Freq: Two times a day (BID) | INTRAMUSCULAR | Status: DC
  Administered 2019-04-22 – 2019-04-24 (×4): 40 mg via INTRAVENOUS
  Filled 2019-04-22 (×4): qty 4

## 2019-04-22 MED ORDER — LISINOPRIL 20 MG PO TABS
20.0000 mg | ORAL_TABLET | Freq: Every morning | ORAL | Status: DC
  Administered 2019-04-23 – 2019-04-24 (×2): 20 mg via ORAL
  Filled 2019-04-22 (×2): qty 1

## 2019-04-22 MED ORDER — SODIUM CHLORIDE 0.9 % IV BOLUS: 1000.0000 mL | Freq: Once | INTRAVENOUS | Status: DC

## 2019-04-22 NOTE — ED Notes (Signed)
Patient transported to x-ray. ?

## 2019-04-22 NOTE — ED Notes (Signed)
Admitting at bedside 

## 2019-04-22 NOTE — ED Triage Notes (Signed)
Pt reports weakness for 6 weeks that has worsened. Endorses SOB and leg swelling. Denies CP

## 2019-04-22 NOTE — ED Provider Notes (Signed)
[facility] EMERGENCY DEPARTMENT Provider Note   CSN: [id number] Arrival date & time: 04/22/19  1109     History   Chief Complaint Chief Complaint  Patient presents with  . Weakness    HPI [name] is a 84 y.o. male.     Pt presents to the ED today with weakness.  He said sx have been going on for 6 weeks, but they have gotten worse over the last few days.  He has had a persistent cough and some swelling in his legs.  His lasix was decreased in May.  He denies any f/c.  No known covid exposures, but he rents out spare rooms in his house with [place].  Pt denies any cp.     Past Medical History:  Diagnosis Date  . A-fib ([place])    RVR-DCCV  . Abnormal PSA 01/17/2012   10.38/4/5/ PSA:2013=6.96/ PSA 07/08/2011=6.26/ PSA 2006=8.6  . 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 Blood transfusion without reported diagnosis    during vagotomy 1976  . Cancer [facility]) 2006   L-2 solitary metastatic lesion/no rad tx ,asymptomatic  . Chronic kidney disease    nephrolithiasis right kidney  . ED (erectile dysfunction)   . GERD (gastroesophageal reflux disease)    PUD S/P PARTIAL VAGOTOMY  . Gynecomastia 04/21/11   seen by Dr.Murray, no rad tx  . H/O impacted cerumen    S/P ENT DR BATES  . History of echocardiogram    echo 4/16:  mild LVH, EF 55-60%, Gr 1 DD, no RWMA, mild AI, mild MR, mod LAE, mild TR  . Hx of cardiovascular stress test    Myoview 5/16:  EF 50%, no scar or ischemia, low risk  . Hydroureteronephrosis    right   . Hyperglycemia    WITHOUT DM2  . Hyperlipidemia   . Hypertension   . Insomnia   . Migraines    WITH AURA  . Nephrolithiasis   . Osteopenia   . Overweight   . Prostate cancer ([place]) 12/09/03 dx   Prostate ca/adenocarcinoma,gleason=3+3=6    pSA 4.6  . Prostate cancer ([place])    METASTATIC WITH L2 SCLEROTIC LESION ON LUPRON------DR. WOODRUFF  . RBBB (right bundle branch block)   . Rosacea   . Sinus bradycardia   .  Sleep apnea    pt unaware  . Stroke [facility])    asymptomatic, found on head imaging  . Ulcer    peptic  . Use of leuprolide acetate (Lupron) 06/2005-11/2006   02/2008 degarelix 02/2008 x 3 doses    Patient Active Problem List   Diagnosis Date Noted  . Chronic systolic CHF (congestive heart failure) ([place]) 10/30/2018  . Acute renal failure superimposed on stage 3 chronic kidney disease ([place]) 10/30/2018  . Syncope 10/30/2018  . Mitral regurgitation 08/10/2018  . Atrial fibrillation ([place]) 06/14/2018  . PAH (pulmonary artery hypertension) ([place]) 06/13/2018  . HTN (hypertension) 06/13/2018  . Acute on chronic kidney failure ([place]) 07/20/2015  . HLD (hyperlipidemia) 07/20/2015  . Stroke (cerebrum) ([place]) 07/20/2015  . Elevated troponin 07/20/2015  . Diabetes mellitus without complication ([place]) [id number]  . Nonsustained paroxysmal ventricular tachycardia ([place]) 12/08/2014  . RBBB 10/22/2014  . Bradycardia 10/22/2014  . PVC's (premature ventricular contractions) 10/22/2014  . Use of leuprolide acetate (Lupron)   . Hydroureteronephrosis   . Blood transfusion without reported diagnosis   . Blurry vision   . Old lacunar strokes by MRI   . Hx  of Prostate cancer   . Cancer ([place])   . Abnormal PSA 01/17/2012    Past Surgical History:  Procedure Laterality Date  . CARDIOVERSION N/A 06/19/2018   Procedure: CARDIOVERSION;  Surgeon: Larey Dresser, MD;  Location: [facility] ENDOSCOPY;  Service: Cardiovascular;  Laterality: N/A;  . PROSTATE BIOPSY  12/09/03   adenocarcinoma,glerason: 3+3=6  . TEE WITHOUT CARDIOVERSION N/A 06/19/2018   Procedure: TRANSESOPHAGEAL ECHOCARDIOGRAM (TEE);  Surgeon: Larey Dresser, MD;  Location: [facility] ENDOSCOPY;  Service: Cardiovascular;  Laterality: N/A;  . VAGOTOMY  1978   partial        Home Medications    Prior to Admission medications   Medication Sig Start Date End Date Taking? Authorizing Provider  alfuzosin (UROXATRAL) 10 MG 24 hr tablet Take 10 mg by mouth  daily. 03/08/18   [provider]  amiodarone (PACERONE) 200 MG tablet Take 200 mg by mouth daily.    [provider]  apixaban (ELIQUIS) 5 MG TABS tablet Take 2.5 mg by mouth 2 (two) times daily.    [provider]  atorvastatin (LIPITOR) 20 MG tablet Take 1 tablet (20 mg total) by mouth daily. 04/29/18 11/29/18  Arrien, Jimmy Picket, MD  furosemide (LASIX) 40 MG tablet Take 40 mg by mouth daily.    [provider]  gabapentin (NEURONTIN) 100 MG capsule Take 300 mg by mouth daily.    [provider]  GLUCOSAMINE-CHONDROITIN PO Take 1 tablet by mouth 2 (two) times a day.    [provider]  lisinopril (ZESTRIL) 20 MG tablet Take 20 mg by mouth 2 (two) times a day.    [provider]  metFORMIN (GLUCOPHAGE) 500 MG tablet Take 1 tablet (500 mg total) by mouth 2 (two) times daily with a meal. 04/29/18 11/29/18  Arrien, Jimmy Picket, MD  metoprolol succinate (TOPROL-XL) 50 MG 24 hr tablet Take 50 mg by mouth daily. Take with or immediately following a meal.    [provider]  nystatin cream (MYCOSTATIN) APP EXT AA BID 10/17/18   [provider]  potassium chloride SA (K-DUR) 20 MEQ tablet Take 20 mEq by mouth every other day.    [provider]  Propylene Glycol (SYSTANE COMPLETE) 0.6 % SOLN Place 1-2 drops into both eyes 3 (three) times daily as needed (for dryness).    [provider]  spironolactone (ALDACTONE) 25 MG tablet Take 25 mg by mouth daily.  07/03/18   [provider]    Family History Family History  Problem Relation Age of Onset  . Liver cancer Father   . Kidney Stones Father   . Colon cancer Neg Hx   . Stroke Neg Hx     Social History Social History   Tobacco Use  . Smoking status: Former Smoker    Types: Pipe    Quit date: 07/05/1984    Years since quitting: 34.8  . Smokeless tobacco: Never Used  Substance Use Topics  . Alcohol use: No  . Drug use: No      Allergies   Aspirin   Review of Systems Review of Systems  Constitutional: Positive for fatigue.  Respiratory: Positive for cough and shortness of breath.   Cardiovascular: Positive for leg swelling.  Neurological: Positive for weakness.  All other systems reviewed and are negative.    Physical Exam Updated Vital Signs BP (!) 196/132   Pulse 73   Temp 98.3 F (36.8 C) (Oral)   Resp (!) 21   Ht 6' (1.829 m)  Wt 108.9 kg   SpO2 95%   BMI 32.55 kg/m   Physical Exam Vitals signs and nursing note reviewed.  Constitutional:      Appearance: Normal appearance.  HENT:     Head: Normocephalic and atraumatic.     Right Ear: External ear normal.     Left Ear: External ear normal.     Nose: Nose normal.     Mouth/Throat:     Mouth: Mucous membranes are moist.     Pharynx: Oropharynx is clear.  Eyes:     Extraocular Movements: Extraocular movements intact.     Conjunctiva/sclera: Conjunctivae normal.     Pupils: Pupils are equal, round, and reactive to light.  Neck:     Musculoskeletal: Normal range of motion and neck supple.  Cardiovascular:     Rate and Rhythm: Normal rate and regular rhythm.     Pulses: Normal pulses.     Heart sounds: Normal heart sounds.  Pulmonary:     Effort: Pulmonary effort is normal.     Breath sounds: Normal breath sounds.  Abdominal:     General: Abdomen is flat. Bowel sounds are normal.     Palpations: Abdomen is soft.  Musculoskeletal:     Right lower leg: Edema present.     Left lower leg: Edema present.  Skin:    General: Skin is warm.     Capillary Refill: Capillary refill takes less than 2 seconds.  Neurological:     General: No focal deficit present.     Mental Status: He is alert and oriented to person, place, and time.  Psychiatric:        Mood and Affect: Mood normal.        Behavior: Behavior normal.      ED Treatments / Results  Labs (all labs ordered are listed, but only abnormal results are displayed) Labs  Reviewed  BASIC METABOLIC PANEL - Abnormal; Notable for the following components:      Result Value   Glucose, Bld 138 (*)    Calcium 8.8 (*)    GFR calc non Af Amer 57 (*)    All other components within normal limits  CBC - Abnormal; Notable for the following components:   Hemoglobin 12.8 (*)    All other components within normal limits  TROPONIN I (HIGH SENSITIVITY) - Abnormal; Notable for the following components:   Troponin I (High Sensitivity) 36 (*)    All other components within normal limits  TROPONIN I (HIGH SENSITIVITY) - Abnormal; Notable for the following components:   Troponin I (High Sensitivity) 43 (*)    All other components within normal limits  SARS CORONAVIRUS 2 (TAT 6-24 HRS)  URINALYSIS, COMPLETE (UACMP) WITH MICROSCOPIC  HEPATIC FUNCTION PANEL  CBG MONITORING, ED    EKG EKG Interpretation  Date/Time:  Sunday April 22 2019 11:18:17 EDT Ventricular Rate:  71 PR Interval:  194 QRS Duration: 144 QT Interval:  464 QTC Calculation: 504 R Axis:   -78 Text Interpretation:  Normal sinus rhythm Left axis deviation Right bundle branch block Abnormal ECG No significant change since last tracing Confirmed by Isla Pence [phone]) on 04/22/2019 11:49:14 AM   Radiology Dg Chest 2 View  Result Date: 04/22/2019 CLINICAL DATA:  84 year old male with history of shortness of breath. EXAM: CHEST - 2 VIEW COMPARISON:  Chest x-ray 10/30/2018. FINDINGS: Lung volumes are normal. No consolidative airspace disease. No pleural effusions. No pneumothorax. No pulmonary nodule or mass noted. Pulmonary vasculature is normal. Heart  size is mildly enlarged. Upper mediastinal contours are within normal limits. IMPRESSION: 1. No radiographic evidence of acute cardiopulmonary disease. 2. Mild cardiomegaly. Electronically Signed   By: Vinnie Langton M.D.   On: 04/22/2019 13:28    Procedures Procedures (including critical care time)  Medications Ordered in ED Medications  sodium  chloride flush (NS) 0.9 % injection 3 mL (3 mLs Intravenous Not Given 04/22/19 1408)  0.9 %  sodium chloride infusion ( Intravenous New Bag/Given 04/22/19 1245)  metoprolol tartrate (LOPRESSOR) injection 5 mg (has no administration in time range)  furosemide (LASIX) injection 40 mg (40 mg Intravenous Given 04/22/19 1405)     Initial Impression / Assessment and Plan / ED Course  I have reviewed the triage vital signs and the nursing notes.  Pertinent labs & imaging results that were available during my care of the patient were reviewed by me and considered in my medical decision making (see chart for details).     Pt does have a hx of CHF.  EF had been as low as 20% (December 2019), but has recovered (as of March 2020).  He has not had any recent cardiac caths.  He has a hx of afib, but is not in afib now.  He is on Eliquis.  CHA2DS2/VAS Stroke Risk Points  Current as of 35 minutes ago     7 >= 2 Points: High Risk  1 - 1.99 Points: Medium Risk  0 Points: Low Risk    The patient's score has not changed in the past year.: No Change     Details    This score determines the patient's risk of having a stroke if the  patient has atrial fibrillation.       Points Metrics  1 Has Congestive Heart Failure:  Yes    Current as of 35 minutes ago  0 Has Vascular Disease:  No    Current as of 35 minutes ago  1 Has Hypertension:  Yes    Current as of 35 minutes ago  2 Age:  37    Current as of 35 minutes ago  1 Has Diabetes:  Yes    Current as of 35 minutes ago  2 Had Stroke:  Yes  Had TIA:  No  Had thromboembolism:  No    Current as of 35 minutes ago  0 Male:  No    Current as of 35 minutes ago       Pt does not have cp, but may have an anginal equivalent as his troponin is slightly elevated.  He was d/w Dr. Doristine Bosworth for obs.    Final Clinical Impressions(s) / ED Diagnoses   Final diagnoses:  Elevated troponin  Essential hypertension  Peripheral edema    ED Discharge Orders     None       Isla Pence, MD 04/22/19 [phone]

## 2019-04-22 NOTE — ED Notes (Signed)
Patient provided with urinal. Call bell and phone within reach.

## 2019-04-22 NOTE — H&P (Signed)
History and Physical    [name] [id number] DOB: [birth year] DOA: 04/22/2019  PCP: [name], MD  Patient coming from: Home I have personally briefly reviewed patient's old medical records in [place]  Chief Complaint: Progressive exertional shortness of breath, generalized weakness since 6 weeks.  HPI: [name] is a 84 y.o. male with medical history significant of hypertension, diabetes, A. Fib-on Eliquis, CKD stage III, chronic systolic congestive heart failure, hyperlipidemia, RBBB, presents to emergency department due to worsening exertional shortness of breath, generalized weakness since 6 weeks.  Patient reports shortness of breath associated with mild exertion, orthopnea, PND, leg swelling, intermittent productive cough with whitish sputum.  Denies chest pain, palpitation, headache, blurry vision, fever, chills, sore throat, runny nose, nausea, vomiting, epigastric pain, diarrhea, decreased appetite, urinary or bowel changes.  I reviewed his home meds-he is currently not on any diuretics.  He lives alone and he rents out spare rooms in his house with [place].  Denies recent travel, COVID-19 exposure, smoking, alcohol, illicit drug use.  Reports being compliant with his medications.  Does not see any cardiologist outpatient.  ED Course: Upon arrival: His blood pressure was elevated.  Troponin trended up from 36-43.  EKG shows normal sinus rhythm, right bundle branch block, left axis deviation.  Chest x-ray was obtained which came back negative for fluid overload.  Patient received IV labetalol for his elevated blood pressure.  Review of Systems: As per HPI otherwise negative.    Past Medical History:  Diagnosis Date  . A-fib ([place])    RVR-DCCV  . Abnormal PSA 01/17/2012   10.38/4/5/ PSA:2013=6.96/ PSA 07/08/2011=6.26/ PSA 2006=8.6  . 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 Blood transfusion without reported diagnosis    during vagotomy 1976  .  Cancer [facility]) 2006   L-2 solitary metastatic lesion/no rad tx ,asymptomatic  . Chronic kidney disease    nephrolithiasis right kidney  . ED (erectile dysfunction)   . GERD (gastroesophageal reflux disease)    PUD S/P PARTIAL VAGOTOMY  . Gynecomastia 04/21/11   seen by Dr.Murray, no rad tx  . H/O impacted cerumen    S/P ENT DR BATES  . History of echocardiogram    echo 4/16:  mild LVH, EF 55-60%, Gr 1 DD, no RWMA, mild AI, mild MR, mod LAE, mild TR  . Hx of cardiovascular stress test    Myoview 5/16:  EF 50%, no scar or ischemia, low risk  . Hydroureteronephrosis    right   . Hyperglycemia    WITHOUT DM2  . Hyperlipidemia   . Hypertension   . Insomnia   . Migraines    WITH AURA  . Nephrolithiasis   . Osteopenia   . Overweight   . Prostate cancer ([place]) 12/09/03 dx   Prostate ca/adenocarcinoma,gleason=3+3=6    pSA 4.6  . Prostate cancer ([place])    METASTATIC WITH L2 SCLEROTIC LESION ON LUPRON------DR. WOODRUFF  . RBBB (right bundle branch block)   . Rosacea   . Sinus bradycardia   . Sleep apnea    pt unaware  . Stroke [facility])    asymptomatic, found on head imaging  . Ulcer    peptic  . Use of leuprolide acetate (Lupron) 06/2005-11/2006   02/2008 degarelix 02/2008 x 3 doses    Past Surgical History:  Procedure Laterality Date  . CARDIOVERSION N/A 06/19/2018   Procedure: CARDIOVERSION;  Surgeon: Larey Dresser, MD;  Location: [facility] ENDOSCOPY;  Service: Cardiovascular;  Laterality: N/A;  . PROSTATE BIOPSY  12/09/03   adenocarcinoma,glerason: 3+3=6  . TEE WITHOUT CARDIOVERSION N/A 06/19/2018   Procedure: TRANSESOPHAGEAL ECHOCARDIOGRAM (TEE);  Surgeon: Larey Dresser, MD;  Location: [facility] ENDOSCOPY;  Service: Cardiovascular;  Laterality: N/A;  . VAGOTOMY  1978   partial     reports that he quit smoking about 34 years ago. His smoking use included pipe. He has never used smokeless tobacco. He reports that he does not drink alcohol or use drugs.  Allergies  Allergen Reactions  .  Aspirin Other (See Comments)    History of "stomach ulcers," was told to not take this     Family History  Problem Relation Age of Onset  . Liver cancer Father   . Kidney Stones Father   . Colon cancer Neg Hx   . Stroke Neg Hx     Prior to Admission medications   Medication Sig Start Date End Date Taking? Authorizing Provider  alfuzosin (UROXATRAL) 10 MG 24 hr tablet Take 10 mg by mouth daily. 03/08/18   [provider]  amiodarone (PACERONE) 200 MG tablet Take 200 mg by mouth daily.    [provider]  apixaban (ELIQUIS) 5 MG TABS tablet Take 2.5 mg by mouth 2 (two) times daily.    [provider]  atorvastatin (LIPITOR) 20 MG tablet Take 1 tablet (20 mg total) by mouth daily. 04/29/18 11/29/18  Arrien, Jimmy Picket, MD  furosemide (LASIX) 40 MG tablet Take 40 mg by mouth daily.    [provider]  gabapentin (NEURONTIN) 100 MG capsule Take 300 mg by mouth daily.    [provider]  GLUCOSAMINE-CHONDROITIN PO Take 1 tablet by mouth 2 (two) times a day.    [provider]  lisinopril (ZESTRIL) 20 MG tablet Take 20 mg by mouth 2 (two) times a day.    [provider]  metFORMIN (GLUCOPHAGE) 500 MG tablet Take 1 tablet (500 mg total) by mouth 2 (two) times daily with a meal. 04/29/18 11/29/18  Arrien, Jimmy Picket, MD  metoprolol succinate (TOPROL-XL) 50 MG 24 hr tablet Take 50 mg by mouth daily. Take with or immediately following a meal.    [provider]  nystatin cream (MYCOSTATIN) APP EXT AA BID 10/17/18   [provider]  potassium chloride SA (K-DUR) 20 MEQ tablet Take 20 mEq by mouth every other day.    [provider]  Propylene Glycol (SYSTANE COMPLETE) 0.6 % SOLN Place 1-2 drops into both eyes 3 (three) times daily as needed (for dryness).    [provider]  spironolactone (ALDACTONE) 25 MG tablet Take 25 mg by mouth daily.  07/03/18   [provider]    Physical Exam:  Vitals:   04/22/19 1315 04/22/19 1408 04/22/19 1430 04/22/19 1530  BP:  (!) 181/117 (!) 196/132 (!) 207/130  Pulse: 66 64 73 64  Resp: (!) 21 20 (!) 21 (!) 22  Temp:      TempSrc:      SpO2: 94% 95% 95% 96%  Weight:      Height:        Constitutional: NAD, calm, comfortable Vitals:   04/22/19 1315 04/22/19 1408 04/22/19 1430 04/22/19 1530  BP:  (!) 181/117 (!) 196/132 (!) 207/130  Pulse: 66 64 73 64  Resp: (!) 21 20 (!) 21 (!) 22  Temp:      TempSrc:      SpO2: 94% 95% 95% 96%  Weight:  Height:       Constitutional: Alert and oriented x3, not in acute distress, communicating well.   Eyes: PERRL, lids and conjunctivae normal ENMT: Mucous membranes are moist. Posterior pharynx clear of any exudate or lesions.Normal dentition.  Neck: normal, supple, no masses, no thyromegaly Respiratory: clear to auscultation bilaterally, no wheezing, no crackles. Normal respiratory effort. No accessory muscle use.  Cardiovascular: Regular rate and rhythm, no murmurs / rubs / gallops.  Bilateral 3+ pitting edema positive. 2+ pedal pulses. No carotid bruits.  Abdomen: no tenderness, no masses palpated. No hepatosplenomegaly. Bowel sounds positive.  Musculoskeletal: no clubbing / cyanosis. No joint deformity upper and lower extremities. Good ROM, no contractures. Normal muscle tone.  Skin: no rashes, lesions, ulcers. No induration Neurologic: CN 2-12 grossly intact. Sensation intact, DTR normal. Strength 5/5 in all 4.  Psychiatric: Normal judgment and insight. Alert and oriented x 3. Normal mood.    Labs on Admission: I have personally reviewed following labs and imaging studies  CBC: Recent Labs  Lab 04/22/19 1142  WBC 6.9  HGB 12.8*  HCT 40.1  MCV 93.9  PLT 123XX123   Basic Metabolic Panel: Recent Labs  Lab 04/22/19 1142  NA 140  K 4.2  CL 107  CO2 22  GLUCOSE 138*  BUN 20  CREATININE 1.18  CALCIUM 8.8*   GFR: Estimated Creatinine Clearance: 61.5 mL/min (by C-G formula  based on SCr of 1.18 mg/dL). Liver Function Tests: Recent Labs  Lab 04/22/19 1142  AST 25  ALT 24  ALKPHOS 87  BILITOT 0.8  PROT 7.1  ALBUMIN 3.8   No results for input(s): LIPASE, AMYLASE in the last 168 hours. No results for input(s): AMMONIA in the last 168 hours. Coagulation Profile: No results for input(s): INR, PROTIME in the last 168 hours. Cardiac Enzymes: No results for input(s): CKTOTAL, CKMB, CKMBINDEX, TROPONINI in the last 168 hours. BNP (last 3 results) Recent Labs    08/10/18 1148  PROBNP 942*   HbA1C: No results for input(s): HGBA1C in the last 72 hours. CBG: No results for input(s): GLUCAP in the last 168 hours. Lipid Profile: No results for input(s): CHOL, HDL, LDLCALC, TRIG, CHOLHDL, LDLDIRECT in the last 72 hours. Thyroid Function Tests: No results for input(s): TSH, T4TOTAL, FREET4, T3FREE, THYROIDAB in the last 72 hours. Anemia Panel: No results for input(s): VITAMINB12, FOLATE, FERRITIN, TIBC, IRON, RETICCTPCT in the last 72 hours. Urine analysis:    Component Value Date/Time   COLORURINE YELLOW 04/22/2019 1258   APPEARANCEUR CLEAR 04/22/2019 1258   LABSPEC 1.012 04/22/2019 1258   PHURINE 5.0 04/22/2019 1258   GLUCOSEU NEGATIVE 04/22/2019 1258   HGBUR NEGATIVE 04/22/2019 [place] 04/22/2019 [place] 04/22/2019 1258   PROTEINUR NEGATIVE 04/22/2019 1258   UROBILINOGEN 0.2 11/25/2008 0201   NITRITE NEGATIVE 04/22/2019 1258   LEUKOCYTESUR NEGATIVE 04/22/2019 1258    Radiological Exams on Admission: Dg Chest 2 View  Result Date: 04/22/2019 CLINICAL DATA:  84 year old male with history of shortness of breath. EXAM: CHEST - 2 VIEW COMPARISON:  Chest x-ray 10/30/2018. FINDINGS: Lung volumes are normal. No consolidative airspace disease. No pleural effusions. No pneumothorax. No pulmonary nodule or mass noted. Pulmonary vasculature is normal. Heart size is mildly enlarged. Upper mediastinal contours are within  normal limits. IMPRESSION: 1. No radiographic evidence of acute cardiopulmonary disease. 2. Mild cardiomegaly. Electronically Signed   By: Vinnie Langton M.D.   On: 04/22/2019 13:28    EKG: Normal sinus rhythm, right  bundle branch block, left axis deviation, no ST elevation or depression noted.  Assessment/Plan Active Problems:   CKD (chronic kidney disease), stage III   HLD (hyperlipidemia)   Elevated troponin   Diabetes mellitus without complication (HCC)   HTN (hypertension)   Atrial fibrillation (HCC)   Chronic systolic CHF (congestive heart failure) (HCC)   Weakness generalized    Acute on chronic CHF: -Patient presented with worsening shortness of breath, orthopnea, PND and leg swelling. -Chest x-ray is negative for fluid overload.  Ordered proBNP and TSH. -Admit patient for observation.  On telemetry. -Strict INO's and daily weight. -Monitor electrolytes closely.  Reviewed echo from 09/2018 which shows ejection fraction of 50 to 55%. -Continue lisinopril, metoprolol, statin -We will start on Lasix 40 mg IV twice daily.  Check magnesium level.-Consider repeating echo-if he continues to have fluid overload symptoms.  Accelerated hypertension: Blood pressure is elevated upon arrival. -Received IV labetalol in the ED. -We will continue his home meds-lisinopril, metoprolol and monitor his blood pressure closely.  Paroxysmal A. fib: Rate controlled -On telemetry.  Continue metoprolol and Eliquis  Elevated troponin: -Patient denies chest pain. -Troponin elevated from 36-43.  Trend troponin.  EKG no acute changes.  Diabetes mellitus: Check A1c -Hold metformin for now.  Started on sliding scale insulin  Hyperlipidemia: Check lipid panel -Continue atorvastatin.  CKD stage III: Stable -Monitor kidney function.  DVT prophylaxis: TED/SCD/Eliquis Code Status: Full code  family Communication: None present at bedside.  Plan of care discussed with patient in length and he  verbalized understanding and agreed with it. Disposition Plan: Likely home in 1 to 2 days Consults called: None Admission status: Observation   Mckinley Jewel MD [facility] Pager [phone]  If 7PM-7AM, please contact night-coverage [email or web address] Password [email or web address]  04/22/2019, 3:47 PM

## 2019-04-23 ENCOUNTER — Observation Stay (HOSPITAL_BASED_OUTPATIENT_CLINIC_OR_DEPARTMENT_OTHER): Payer: Medicare Other

## 2019-04-23 DIAGNOSIS — I340 Nonrheumatic mitral (valve) insufficiency: Secondary | ICD-10-CM

## 2019-04-23 DIAGNOSIS — R778 Other specified abnormalities of plasma proteins: Secondary | ICD-10-CM | POA: Diagnosis not present

## 2019-04-23 DIAGNOSIS — I361 Nonrheumatic tricuspid (valve) insufficiency: Secondary | ICD-10-CM

## 2019-04-23 DIAGNOSIS — I5021 Acute systolic (congestive) heart failure: Secondary | ICD-10-CM

## 2019-04-23 DIAGNOSIS — I5022 Chronic systolic (congestive) heart failure: Secondary | ICD-10-CM | POA: Diagnosis not present

## 2019-04-23 DIAGNOSIS — I480 Paroxysmal atrial fibrillation: Secondary | ICD-10-CM | POA: Diagnosis not present

## 2019-04-23 LAB — ECHOCARDIOGRAM COMPLETE
Height: 72 in
Weight: 3785.6 oz

## 2019-04-23 LAB — GLUCOSE, CAPILLARY
Glucose-Capillary: 197 mg/dL — ABNORMAL HIGH (ref 70–99)
Glucose-Capillary: 121 mg/dL — ABNORMAL HIGH (ref 70–99)
Glucose-Capillary: 82 mg/dL (ref 70–99)
Glucose-Capillary: 149 mg/dL — ABNORMAL HIGH (ref 70–99)

## 2019-04-23 LAB — BASIC METABOLIC PANEL
Anion gap: 13 (ref 5–15)
BUN: 21 mg/dL (ref 8–23)
CO2: 25 mmol/L (ref 22–32)
Calcium: 8.7 mg/dL — ABNORMAL LOW (ref 8.9–10.3)
Chloride: 104 mmol/L (ref 98–111)
Creatinine, Ser: 1.3 mg/dL — ABNORMAL HIGH (ref 0.61–1.24)
GFR calc Af Amer: 59 mL/min — ABNORMAL LOW (ref >60)
GFR calc non Af Amer: 51 mL/min — ABNORMAL LOW (ref >60)
Glucose, Bld: 117 mg/dL — ABNORMAL HIGH (ref 70–99)
Potassium: 3.5 mmol/L (ref 3.5–5.1)
Sodium: 142 mmol/L (ref 135–145)

## 2019-04-23 LAB — CBC
HCT: 39.9 % (ref 39.0–52.0)
Hemoglobin: 13 g/dL (ref 13.0–17.0)
MCH: 30.4 pg (ref 26.0–34.0)
MCHC: 32.6 g/dL (ref 30.0–36.0)
MCV: 93.4 fL (ref 80.0–100.0)
Platelets: 221 10*3/uL (ref 150–400)
RBC: 4.27 MIL/uL (ref 4.22–5.81)
RDW: 13.1 % (ref 11.5–15.5)
WBC: 6.7 10*3/uL (ref 4.0–10.5)
nRBC: 0 % (ref 0.0–0.2)

## 2019-04-23 MED ORDER — HYDRALAZINE HCL 20 MG/ML IJ SOLN
5.0000 mg | Freq: Once | INTRAMUSCULAR | Status: AC
  Administered 2019-04-23: 01:00:00 5 mg via INTRAVENOUS
  Filled 2019-04-23: qty 1

## 2019-04-23 MED ORDER — HYDRALAZINE HCL 25 MG PO TABS
25.0000 mg | ORAL_TABLET | Freq: Three times a day (TID) | ORAL | Status: DC
  Administered 2019-04-23 – 2019-04-24 (×4): 25 mg via ORAL
  Filled 2019-04-23 (×4): qty 1

## 2019-04-23 NOTE — Evaluation (Signed)
Physical Therapy Evaluation Patient Details Name: [name] MRN: [id number] DOB: [birth year] Today's Date: 04/23/2019   History of Present Illness  Patient is a 84 y/o male who presents with SOB, and weakness over the last 6 weeks. Admitted with acute on chronic CHF and accelerated HTN. PMH includes prostate ca, diastolic heart failure, HTN, DM, PAH, A. Fib-on Eliquis, CKD stage III, RBBB.  Clinical Impression  Patient presents with generalized weakness, dyspnea on exertion, BLE swelling and impaired mobility s/p above. 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 Has to be able to negotiate flights of stairs to run Air bnb and get to basement. Tolerated transfers and gait training with Min A-Min guard for safety. Noted to have 2/4 DOE and SP02 remained >94% on RA. Encouraged pt to increase activity while in the hospital. Will follow acutely to maximize independence and mobility prior to return home. Will need to negotiate stairs next session as tolerated.     Follow Up Recommendations Supervision - Intermittent;No PT follow up    Equipment Recommendations  None recommended by PT    Recommendations for Other Services       Precautions / Restrictions Precautions Precautions: Fall Precaution Comments: watch BP Restrictions Weight Bearing Restrictions: No      Mobility  Bed Mobility Overal bed mobility: Modified Independent             General bed mobility comments: Increased time and use of rail, no assist needed.  Transfers Overall transfer level: Needs assistance Equipment used: None Transfers: Sit to/from Stand Sit to Stand: Min guard         General transfer comment: Min guard for safety, reaching over to counter for support initially needing Min A.  Ambulation/Gait Ambulation/Gait assistance: Min guard Gait Distance (Feet): 250 Feet Assistive device: None Gait Pattern/deviations: Step-through pattern;Decreased stride length Gait velocity: decreased Gait velocity  interpretation: 1.31 - 2.62 ft/sec, indicative of limited community ambulator General Gait Details: Slow, mildly unsteady gait with right knee instability, no buckling. Sp02 remained >94% on RA. 2/4 DOE.  Stairs            Wheelchair Mobility    Modified Rankin (Stroke Patients Only)       Balance Overall balance assessment: History of Falls;Needs assistance Sitting-balance support: Feet supported;No upper extremity supported Sitting balance-Leahy Scale: Good     Standing balance support: During functional activity Standing balance-Leahy Scale: Fair Standing balance comment: Initially pt reaching for counter upon standing due to LOB but able to recover wtih Min A.                             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 Also has a cleaning lady.     Hand Dominance   Dominant Hand: Right    Extremity/Trunk Assessment   Upper Extremity Assessment Upper Extremity Assessment: Defer to OT evaluation    Lower Extremity Assessment Lower Extremity Assessment: Generalized  weakness(but functional)       Communication   Communication: HOH  Cognition Arousal/Alertness: Awake/alert Behavior During Therapy: WFL for tasks assessed/performed Overall Cognitive Status: Within Functional Limits for tasks assessed                                 General Comments: Does not  have a good understanding of heart failure and health conditions      General Comments General comments (skin integrity, edema, etc.): Bp 161/107 prior to session, given BP medication.    Exercises     Assessment/Plan    PT Assessment Patient needs continued PT services  PT Problem List Decreased strength;Decreased mobility;Decreased balance;Cardiopulmonary status limiting activity;Decreased activity tolerance       PT Treatment Interventions Therapeutic activities;Gait training;Therapeutic exercise;Patient/family education;Stair training;Balance training;Functional mobility training    PT Goals (Current goals can be found in the Care Plan section)  Acute Rehab PT Goals Patient Stated Goal: to feel better PT Goal Formulation: With patient Time For Goal Achievement: 05/07/19 Potential to Achieve Goals: Good    Frequency Min 3X/week   Barriers to discharge Inaccessible home environment stairs    Co-evaluation               AM-PAC PT "6 Clicks" Mobility  Outcome Measure Help needed turning from your back to your side while in a flat bed without using bedrails?: A Little Help needed moving from lying on your back to sitting on the side of a flat bed without using bedrails?: A Little Help needed moving to and from a bed to a chair (including a wheelchair)?: A Little Help needed standing up from a chair using your arms (e.g., wheelchair or bedside chair)?: A Little Help needed to walk in hospital room?: A Little Help needed climbing 3-5 steps with a railing? : A Little 6 Click Score: 18    End of Session Equipment Utilized During Treatment: Gait belt Activity Tolerance: Patient tolerated treatment well Patient left: in bed;with call bell/phone within reach;with bed alarm set Nurse Communication: Mobility status PT Visit Diagnosis: Muscle weakness (generalized) (M62.81);Difficulty in walking, not elsewhere classified (R26.2)    Time: [id number] PT Time Calculation  (min) (ACUTE ONLY): 23 min   Charges:   PT Evaluation $PT Eval Moderate Complexity: 1 Mod PT Treatments $Gait Training: 8-22 mins        Wray Kearns, PT, DPT Acute Rehabilitation Services Pager [phone] Office [place] 04/23/2019, 4:21 PM

## 2019-04-23 NOTE — Progress Notes (Signed)
BP 159/103. Repeated twice. Paged provider.

## 2019-04-23 NOTE — Progress Notes (Signed)
SATURATION QUALIFICATIONS: (This note is used to comply with regulatory documentation for home oxygen) ? ?Patient Saturations on Room Air at Rest = 94% ? ?Patient Saturations on Room Air while Ambulating = 95% ? ?Patient Saturations on 0 Liters of oxygen while Ambulating = 95% ? ?Please briefly explain why patient needs home oxygen: ?

## 2019-04-23 NOTE — Progress Notes (Signed)
PROGRESS NOTE  [name] [id number] DOB: [birth year] DOA: 04/22/2019 PCP: [name], MD  Brief History   [name] is a 84 y.o. male with medical history significant of hypertension, diabetes, A. Fib-on Eliquis, CKD stage III, chronic systolic congestive heart failure, hyperlipidemia, RBBB, presents to emergency department due to worsening exertional shortness of breath, generalized weakness since 6 weeks.  Patient reports shortness of breath associated with mild exertion, orthopnea, PND, leg swelling, intermittent productive cough with whitish sputum.  Denies chest pain, palpitation, headache, blurry vision, fever, chills, sore throat, runny nose, nausea, vomiting, epigastric pain, diarrhea, decreased appetite, urinary or bowel changes.  I reviewed his home meds-he is currently not on any diuretics.  He lives alone and he rents out spare rooms in his house with air PND.  Denies recent travel, COVID-19 exposure, smoking, alcohol, illicit drug use.  Reports being compliant with his medications.  Does not see any cardiologist outpatient.  ED Course: Upon arrival: His blood pressure was elevated.  Troponin trended up from 36-43.  EKG shows normal sinus rhythm, right bundle branch block, left axis deviation.  Chest x-ray was obtained which came back negative for fluid overload.  Patient received IV labetalol for his elevated blood pressure.  The patient was admitted to a telemetry bed. He has been diuresed, and his fluid balance is negative by 2449 cc this morning.  Consultants  . None  Procedures  . None  Antibiotics   Anti-infectives (From admission, onward)   None    .  Subjective  The patient states that his breathing is better, but that his legs were still swollen.   Objective   Vitals:  Vitals:   04/23/19 0743 04/23/19 1110  BP: (!) 156/104 130/70  Pulse: (!) 58 (!) 53  Resp: 20 15  Temp: 98.2 F (36.8 C) 98.4 F (36.9 C)  SpO2: 93% 94%   Exam:   Constitutional:  . The patient is awake, alert, and oriented x 3. No acute distress. Respiratory:  . No increased work of breathing. . No wheezes, rales, or rhonchi . No tactile fremitus Cardiovascular:  . Regular rate and rhythm . No murmurs, ectopy, or gallups. . No lateral PMI. No thrills. Abdomen:  . Abdomen is soft, non-tender, non-distended. . The abdomen is morbidly obese. . No hernias, masses, or organomegaly . Normoactive bowel sounds.  Musculoskeletal:  No cyanosis or clubbing Lower extremities 3-4+ pitting edema bilaterally. . No rashes, lesions, ulcers . palpation of skin: no induration or nodules Neurologic:  . CN 2-12 intact . Sensation all 4 extremities intact Psychiatric:  . Mental status o Mood, affect appropriate o Orientation to person, place, time  . judgment and insight appear intact  I have personally reviewed the following:   Today's Data  Vitals, BMP, CBC  Cardiology Data  . EKG, Echocardiogram is pending.  Other Data   Scheduled Meds: . apixaban  2.5 mg Oral BID  . atorvastatin  20 mg Oral Daily  . furosemide  40 mg Intravenous Q12H  . hydrALAZINE  25 mg Oral Q8H  . insulin aspart  0-15 Units Subcutaneous TID WC  . insulin aspart  0-5 Units Subcutaneous QHS  . lisinopril  20 mg Oral q morning - 10a  . sodium chloride flush  3 mL Intravenous Once   Continuous Infusions: . sodium chloride Stopped (04/22/19 1600)    Active Problems:   CKD (chronic kidney disease), stage III   HLD (hyperlipidemia)   Elevated troponin   Diabetes mellitus without  complication (HCC)   HTN (hypertension)   Atrial fibrillation (HCC)   Chronic systolic CHF (congestive heart failure) (HCC)   Weakness generalized   LOS: 0 days   A & P  Acute on chronic CHF: The patient was admitted to a telemetry bed. He has been diuresed, and his fluid balance is negative by 2449 cc this morning. Patient presented with worsening shortness of breath, orthopnea, PND and  leg swelling. He is currently saturating in the low to mid nineties on room air. He continues to have severe lower extremity edema. Repeat echocardiogram has been ordered. Echo performed in March of [id number] had an systolic EF of [id number] and no evidence of diastolic dysfunction, and normal right systolic failure. Chest x-ray was negative for fluid overload.  The patient's volume status is being monitored by strict INO's and daily weight. Creatinine and electrolytes are also being carefully  I have reviewed echo from 09/2018 which shows ejection fraction of 50 to 55%. This will be repeated in light of the patient's obvious volume overload. He will be continued on lisinopril, metoprolol, and statin. He is receiving Lasix 40 mg IV twice daily.   Accelerated hypertension: Blood pressure is elevated upon arrival. The patient's hypertension is better controlled after the patient received IV labetalol in the ED. Metoprolol and hydralazine were also given on the floor. Due to the patient's apparent exacerbation of CHF, I will stop the metoprolol and restart lisinopril Monitor.   Paroxysmal A. fib: Rate controlled The patient is being monitored on telemetry. Continue Eliquis. Stop metoprolol due to CHF exacerbation.   Elevated troponin: Troponins have remained flat. No complaints of chest pain.  Diabetes mellitus: Check A1c. Metformin held for now. Glucoses have run 125 - 197 in the last 24 hours with correction insulin onlhy.  Hyperlipidemia: Continue atorvastatin.  AKI on CKDIII Baseline creatinine appears to be CKD stage III: Baseline creatinine appears to be about 1, although it has frequently been between 1.2 and 1.4 in the past few months. Creatinine 1.3 this morning. Monitor creatinine, electrolytes, and volume status. Avoid nephrotoxic medications and hypotension.   DVT prophylaxis: TED/SCD/Eliquis Code Status: Full code  family Communication: None present at bedside.  Plan of care discussed with  patient in length and he verbalized understanding and agreed with it. Disposition Plan: Likely home in 1 to 2 days   Kariana Wiles, DO [facility] Direct contact: see [email or web address]  7PM-7AM contact night coverage as above 04/23/2019, 1:47 PM  LOS: 0 days

## 2019-04-23 NOTE — Progress Notes (Signed)
  Echocardiogram 2D Echocardiogram has been performed.  [name] 04/23/2019, 3:58 PM

## 2019-04-23 NOTE — Care Management Obs Status (Signed)
MEDICARE OBSERVATION STATUS NOTIFICATION   Patient Details  Name: Jakari Jean MRN: [id number] Date of Birth: Sep 13, 1934   Medicare Observation Status Notification Given:  Yes    Zenon Mayo, RN 04/23/2019, 5:10 PM

## 2019-04-24 DIAGNOSIS — I480 Paroxysmal atrial fibrillation: Secondary | ICD-10-CM | POA: Diagnosis present

## 2019-04-24 DIAGNOSIS — I160 Hypertensive urgency: Secondary | ICD-10-CM | POA: Diagnosis present

## 2019-04-24 DIAGNOSIS — I5021 Acute systolic (congestive) heart failure: Secondary | ICD-10-CM | POA: Diagnosis present

## 2019-04-24 DIAGNOSIS — F329 Major depressive disorder, single episode, unspecified: Secondary | ICD-10-CM | POA: Diagnosis present

## 2019-04-24 DIAGNOSIS — I5043 Acute on chronic combined systolic (congestive) and diastolic (congestive) heart failure: Secondary | ICD-10-CM | POA: Diagnosis present

## 2019-04-24 DIAGNOSIS — Z8673 Personal history of transient ischemic attack (TIA), and cerebral infarction without residual deficits: Secondary | ICD-10-CM | POA: Diagnosis not present

## 2019-04-24 DIAGNOSIS — I2721 Secondary pulmonary arterial hypertension: Secondary | ICD-10-CM | POA: Diagnosis present

## 2019-04-24 DIAGNOSIS — N179 Acute kidney failure, unspecified: Secondary | ICD-10-CM | POA: Diagnosis present

## 2019-04-24 DIAGNOSIS — E1122 Type 2 diabetes mellitus with diabetic chronic kidney disease: Secondary | ICD-10-CM | POA: Diagnosis present

## 2019-04-24 DIAGNOSIS — K219 Gastro-esophageal reflux disease without esophagitis: Secondary | ICD-10-CM | POA: Diagnosis present

## 2019-04-24 DIAGNOSIS — Z20828 Contact with and (suspected) exposure to other viral communicable diseases: Secondary | ICD-10-CM | POA: Diagnosis present

## 2019-04-24 DIAGNOSIS — Z8711 Personal history of peptic ulcer disease: Secondary | ICD-10-CM | POA: Diagnosis not present

## 2019-04-24 DIAGNOSIS — Z886 Allergy status to analgesic agent status: Secondary | ICD-10-CM | POA: Diagnosis not present

## 2019-04-24 DIAGNOSIS — Z79899 Other long term (current) drug therapy: Secondary | ICD-10-CM | POA: Diagnosis not present

## 2019-04-24 DIAGNOSIS — I452 Bifascicular block: Secondary | ICD-10-CM | POA: Diagnosis present

## 2019-04-24 DIAGNOSIS — M8580 Other specified disorders of bone density and structure, unspecified site: Secondary | ICD-10-CM | POA: Diagnosis present

## 2019-04-24 DIAGNOSIS — N183 Chronic kidney disease, stage 3 unspecified: Secondary | ICD-10-CM | POA: Diagnosis present

## 2019-04-24 DIAGNOSIS — R609 Edema, unspecified: Secondary | ICD-10-CM | POA: Diagnosis not present

## 2019-04-24 DIAGNOSIS — Z7901 Long term (current) use of anticoagulants: Secondary | ICD-10-CM | POA: Diagnosis not present

## 2019-04-24 DIAGNOSIS — R778 Other specified abnormalities of plasma proteins: Secondary | ICD-10-CM | POA: Diagnosis not present

## 2019-04-24 DIAGNOSIS — R531 Weakness: Secondary | ICD-10-CM | POA: Diagnosis present

## 2019-04-24 DIAGNOSIS — Z7984 Long term (current) use of oral hypoglycemic drugs: Secondary | ICD-10-CM | POA: Diagnosis not present

## 2019-04-24 DIAGNOSIS — Z87442 Personal history of urinary calculi: Secondary | ICD-10-CM | POA: Diagnosis not present

## 2019-04-24 DIAGNOSIS — T464X5A Adverse effect of angiotensin-converting-enzyme inhibitors, initial encounter: Secondary | ICD-10-CM | POA: Diagnosis present

## 2019-04-24 DIAGNOSIS — I2510 Atherosclerotic heart disease of native coronary artery without angina pectoris: Secondary | ICD-10-CM | POA: Diagnosis present

## 2019-04-24 DIAGNOSIS — I130 Hypertensive heart and chronic kidney disease with heart failure and stage 1 through stage 4 chronic kidney disease, or unspecified chronic kidney disease: Secondary | ICD-10-CM | POA: Diagnosis present

## 2019-04-24 DIAGNOSIS — I10 Essential (primary) hypertension: Secondary | ICD-10-CM | POA: Diagnosis not present

## 2019-04-24 DIAGNOSIS — E785 Hyperlipidemia, unspecified: Secondary | ICD-10-CM | POA: Diagnosis present

## 2019-04-24 DIAGNOSIS — Z87891 Personal history of nicotine dependence: Secondary | ICD-10-CM | POA: Diagnosis not present

## 2019-04-24 DIAGNOSIS — N1831 Chronic kidney disease, stage 3a: Secondary | ICD-10-CM | POA: Diagnosis not present

## 2019-04-24 LAB — CBC WITH DIFFERENTIAL/PLATELET
Abs Immature Granulocytes: 0.02 10*3/uL (ref 0.00–0.07)
Basophils Absolute: 0.1 10*3/uL (ref 0.0–0.1)
Basophils Relative: 1 %
Eosinophils Absolute: 0.1 10*3/uL (ref 0.0–0.5)
Eosinophils Relative: 2 %
HCT: 41.6 % (ref 39.0–52.0)
Hemoglobin: 13.6 g/dL (ref 13.0–17.0)
Immature Granulocytes: 0 %
Lymphocytes Relative: 20 %
Lymphs Abs: 1.3 10*3/uL (ref 0.7–4.0)
MCH: 30.2 pg (ref 26.0–34.0)
MCHC: 32.7 g/dL (ref 30.0–36.0)
MCV: 92.4 fL (ref 80.0–100.0)
Monocytes Absolute: 0.4 10*3/uL (ref 0.1–1.0)
Monocytes Relative: 7 %
Neutro Abs: 4.5 10*3/uL (ref 1.7–7.7)
Neutrophils Relative %: 70 %
Platelets: 210 10*3/uL (ref 150–400)
RBC: 4.5 MIL/uL (ref 4.22–5.81)
RDW: 13.1 % (ref 11.5–15.5)
WBC: 6.4 10*3/uL (ref 4.0–10.5)
nRBC: 0 % (ref 0.0–0.2)

## 2019-04-24 LAB — HEMOGLOBIN A1C
Hgb A1c MFr Bld: 6.3 % — ABNORMAL HIGH (ref 4.8–5.6)
Mean Plasma Glucose: 134 mg/dL

## 2019-04-24 LAB — GLUCOSE, CAPILLARY
Glucose-Capillary: 126 mg/dL — ABNORMAL HIGH (ref 70–99)
Glucose-Capillary: 114 mg/dL — ABNORMAL HIGH (ref 70–99)
Glucose-Capillary: 103 mg/dL — ABNORMAL HIGH (ref 70–99)
Glucose-Capillary: 156 mg/dL — ABNORMAL HIGH (ref 70–99)

## 2019-04-24 LAB — BASIC METABOLIC PANEL
Anion gap: 13 (ref 5–15)
BUN: 24 mg/dL — ABNORMAL HIGH (ref 8–23)
CO2: 28 mmol/L (ref 22–32)
Calcium: 8.7 mg/dL — ABNORMAL LOW (ref 8.9–10.3)
Chloride: 100 mmol/L (ref 98–111)
Creatinine, Ser: 1.59 mg/dL — ABNORMAL HIGH (ref 0.61–1.24)
GFR calc Af Amer: 46 mL/min — ABNORMAL LOW (ref >60)
GFR calc non Af Amer: 40 mL/min — ABNORMAL LOW (ref >60)
Glucose, Bld: 225 mg/dL — ABNORMAL HIGH (ref 70–99)
Potassium: 3.5 mmol/L (ref 3.5–5.1)
Sodium: 141 mmol/L (ref 135–145)

## 2019-04-24 LAB — PROCALCITONIN: Procalcitonin: <0.1 ng/mL

## 2019-04-24 LAB — BRAIN NATRIURETIC PEPTIDE: B Natriuretic Peptide: 163.1 pg/mL — ABNORMAL HIGH (ref 0.0–100.0)

## 2019-04-24 MED ORDER — SODIUM CHLORIDE 0.9% FLUSH
3.0000 mL | Freq: Two times a day (BID) | INTRAVENOUS | Status: DC
  Administered 2019-04-24 – 2019-04-26 (×3): 3 mL via INTRAVENOUS

## 2019-04-24 MED ORDER — METOPROLOL SUCCINATE ER 25 MG PO TB24
12.5000 mg | ORAL_TABLET | Freq: Every day | ORAL | Status: DC
  Administered 2019-04-24 – 2019-04-26 (×2): 12.5 mg via ORAL
  Filled 2019-04-24 (×2): qty 1

## 2019-04-24 MED ORDER — HYDRALAZINE HCL 50 MG PO TABS
50.0000 mg | ORAL_TABLET | Freq: Three times a day (TID) | ORAL | Status: DC
  Administered 2019-04-24 – 2019-04-26 (×6): 50 mg via ORAL
  Filled 2019-04-24 (×7): qty 1

## 2019-04-24 NOTE — Progress Notes (Signed)
Patient awake. States feeling anxious about diagnosis and aging.   Provided emotional comfort.   Patient requested consult with mental health or psychiatry.

## 2019-04-24 NOTE — Evaluation (Addendum)
Occupational Therapy Evaluation and Discharge Patient Details Name: [name] MRN: [id number] DOB: [birth year] Today's Date: 04/24/2019    History of Present Illness Patient is a 84 y/o male who presents with SOB, and weakness over the last 6 weeks. Admitted with acute on chronic CHF and accelerated HTN. PMH includes prostate ca, diastolic heart failure, HTN, DM, PAH, A. Fib-on Eliquis, CKD stage III, RBBB.   Clinical Impression   Patient supine in bed and agreeable to OT.  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  Admitted for above and limited by problem list below, including decreased activity tolerance.  Patient completing bed mobility, transfers and ADls in room with modified independence, good safety awareness and no losses of balance noted today.  He was educated on energy conservation strategies and recommendations for increased independence and tolerance to complete IADLs, slow progression of IADL engagement. Patient voices understanding, and reports plan to get house cleaner to assist more at dc.  Encouraged mobility with nursing staff, pt agreeable. Based on performance today, no further OT needs have been identified. OT will sign off.  If further needs arise, please re-consult.     Follow Up Recommendations  No OT follow up;Supervision - Intermittent    Equipment Recommendations  Tub/shower seat    Recommendations for Other Services       Precautions / Restrictions Precautions Precautions: Fall Precaution Comments: watch BP Restrictions Weight Bearing Restrictions: No      Mobility Bed Mobility Overal bed mobility: Modified Independent             General bed mobility comments: no assist required  Transfers Overall transfer level: Modified independent   Transfers: Sit to/from Stand Sit to Stand: Modified independent (Device/Increase time)         General transfer comment: no assist required    Balance   Sitting-balance support: No upper  extremity supported;Feet supported Sitting balance-Leahy Scale: Good     Standing balance support: No upper extremity supported;During functional activity Standing balance-Leahy Scale: Fair Standing balance comment: no assist required                            ADL either performed or assessed with clinical judgement   ADL Overall ADL's : Modified independent;At baseline                                       General ADL Comments: patient at modified independent level for ADLs, in room mobility and transfers; educated on energy conservation techniques to maximize participation in ADLs/ IADLs     Vision Baseline Vision/History: Wears glasses Patient Visual Report: No change from baseline Vision Assessment?: No apparent visual deficits     Perception     Praxis      Pertinent Vitals/Pain Pain Assessment: No/denies pain     Hand Dominance Right   Extremity/Trunk Assessment Upper Extremity Assessment Upper Extremity Assessment: Overall WFL for tasks assessed   Lower Extremity Assessment Lower Extremity Assessment: Defer to PT evaluation   Cervical / Trunk Assessment Cervical / Trunk Assessment: Normal   Communication Communication Communication: HOH   Cognition Arousal/Alertness: Awake/alert Behavior During Therapy: WFL for tasks assessed/performed Overall Cognitive Status: Within Functional Limits for tasks assessed  General Comments  reviewed energy conservation techniques     Exercises     Shoulder Instructions      Home Living Family/patient expects to be discharged to:: Private residence Living Arrangements: Alone Available Help at Discharge: Family;Available PRN/intermittently Type of Home: House Home Access: Stairs to enter [employer] of Steps: 1 flight- down to basement where he lives Entrance Stairs-Rails: Right;Left Home Layout: Multi-level Alternate Level  Stairs-Number of Steps: 4 flights, 4 level home (air bnb) Alternate Level Stairs-Rails: Right;Left Bathroom Shower/Tub: Occupational psychologist: Standard     Home Equipment: Grab bars - toilet;Cane - single point          Prior Functioning/Environment Level of Independence: Independent        Comments: Drives, 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 Also has a cleaning lady.        OT Problem List: Decreased activity tolerance;Decreased knowledge of precautions;Decreased knowledge of use of DME or AE;Cardiopulmonary status limiting activity;Obesity      OT Treatment/Interventions:      OT Goals(Current goals can be found in the care plan section) Acute Rehab OT Goals Patient Stated Goal: to be able to get home and feel better OT Goal Formulation: With patient  OT Frequency:     Barriers to D/C:            Co-evaluation              AM-PAC OT "6 Clicks" Daily Activity     Outcome Measure Help from another person eating meals?: None Help from another person taking care of personal grooming?: None Help from another person toileting, which includes using toliet, bedpan, or urinal?: None Help from another person bathing (including washing, rinsing, drying)?: None Help from another person to put on and taking off regular upper body clothing?: None Help from another person to put on and taking off regular lower body clothing?: None 6 Click Score: 24   End of Session Nurse Communication: Mobility status  Activity Tolerance: Patient tolerated treatment well Patient left: in chair;with call bell/phone within reach  OT Visit Diagnosis: Muscle weakness (generalized) (M62.81);Other (comment)(decreased act tolerance)                Time: [id number] OT Time Calculation (min): 22 min Charges:  OT General Charges $OT Visit: 1 Visit OT Evaluation $OT Eval Low Complexity: [place], OT Acute Rehabilitation Services Pager  [phone] Office [phone]   Delight Stare 04/24/2019, 9:02 AM

## 2019-04-24 NOTE — Consult Note (Addendum)
Cardiology Consultation:   Patient ID: [name]; [id number]; 08-20-1934   Admit date: 04/22/2019 Date of Consult: 04/24/2019  Primary Care Provider: [name], MD Primary Cardiologist: Sherren Mocha, MD Primary Electrophysiologist:  Thompson Grayer, MD   Patient Profile:   [name] is a 84 y.o. male with a PMH of chronic combined CHF, paroxysmal atrial fibrillation on Eliquis, HTN, HLD, pulmonary artery HTN, moderate MR, DM type 2, CKD stage 3, who is being seen today for the evaluation of acute on chronic combined CHF at the request of Dr. Benny Lennert.  History of Present Illness:   [name] was in his usual state of health until 6 weeks ago when he developed progressive DOE and generalized weakness. He also had complaints of orthopnea, PND, LE edema, and intermittent productive cough. Given progressive symptoms, he presented to the ED for further evaluation.   He was last evaluated by cardiology via a telemedicine visit with [name], PA-C 11/2018, at which time he was doing well from a cardiac standpoint. He has had multiple admissions over the past year. He was admitted from 04/27/2018-04/29/2018 for acute on chronic diastolic CHF which improved with IV diuresis. He then was admitted from 06/13/2018-06/22/2018 with new onset atrial fibrillation and acute combined CHF, felt to be tachycardia mediated. He underwent TEE/DCCV with successful return to NSR. Echo that admission revealed EF 20% with diffuse hypokinesis with severe hypokineisis of the basal-midinferior myocardium. He had a repeat echocardiogram 09/2018 which showed improvement in EF to 50-55% and normal LV diastolic parameters. He then was admitted from 10/30/2018-10/31/2018 after a syncopal event felt to be 2/2 vasovagal syncope. His last ischemic evaluation appears to be a NST in 2016 which was without ischemia.  At the time of this evaluation he reports improvement in SOB and LE edema. His cough is the most bothersome  symptom at this point. He is fairly active at baseline. 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 He has 4 floors and has noticed increased SOB with climbing stairs in recent weeks. No complaints of chest pain, palpitations, dizziness, lightheadedness, or syncope. He reported recent visit with visit with his PCP where his medications were whittled down. Since his last cardiology visit, he is no longer taking spironolactone, lasix, or amiodarone. He admits to medication non-compliance, missing a few doses of medications a week, including his eliquis.    Hospital course: Markedly hypertensive on arrival which is overall improved today, intermittently tachypneic to 20s and bradycardic to 50s, otherwise VSS. Labs notable for electrolytes wnl, Cr 1.18>1.3>1.59, CBC wnl, Trop 36>43>40, BNP 671>163, TSH wnl, Hgb A1C 6.3. EKG with sinus rhythm, rate 71, chronic RBBB, LAD, no STE/D, no TWI. Echo 04/23/2019 with EF 35-40% (previously 50-55% 09/2018), moderate LVH, diffuse hypokinesis worse in the inferior wall, normal RV function, and mild-moderate MR. Patient was started on IV lasix 40mg  BID with UOP net -2.4L this admission. Weight down to 232lbs from 240lbs on admission. Cardiology asked to evaluate for acute on chronic combined CHF.   Past Medical History:  Diagnosis Date  . A-fib ([place])    RVR-DCCV  . Abnormal PSA 01/17/2012   10.38/4/5/ PSA:2013=6.96/ PSA 07/08/2011=6.26/ PSA 2006=8.6  . 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 Blood transfusion without reported diagnosis    during vagotomy 1976  . Cancer [facility]) 2006   L-2 solitary metastatic lesion/no rad tx ,asymptomatic  . Chronic kidney disease    nephrolithiasis right kidney  . ED (erectile dysfunction)   .  GERD (gastroesophageal reflux disease)    PUD S/P PARTIAL VAGOTOMY  . Gynecomastia 04/21/11   seen by Dr.Murray, no rad tx  . H/O impacted cerumen    S/P ENT DR BATES  . History of echocardiogram    echo 4/16:  mild LVH, EF 55-60%, Gr  1 DD, no RWMA, mild AI, mild MR, mod LAE, mild TR  . Hx of cardiovascular stress test    Myoview 5/16:  EF 50%, no scar or ischemia, low risk  . Hydroureteronephrosis    right   . Hyperglycemia    WITHOUT DM2  . Hyperlipidemia   . Hypertension   . Insomnia   . Migraines    WITH AURA  . Nephrolithiasis   . Osteopenia   . Overweight   . Prostate cancer ([place]) 12/09/03 dx   Prostate ca/adenocarcinoma,gleason=3+3=6    pSA 4.6  . Prostate cancer ([place])    METASTATIC WITH L2 SCLEROTIC LESION ON LUPRON------DR. WOODRUFF  . RBBB (right bundle branch block)   . Rosacea   . Sinus bradycardia   . Sleep apnea    pt unaware  . Stroke [facility])    asymptomatic, found on head imaging  . Ulcer    peptic  . Use of leuprolide acetate (Lupron) 06/2005-11/2006   02/2008 degarelix 02/2008 x 3 doses    Past Surgical History:  Procedure Laterality Date  . CARDIOVERSION N/A 06/19/2018   Procedure: CARDIOVERSION;  Surgeon: Larey Dresser, MD;  Location: [facility] ENDOSCOPY;  Service: Cardiovascular;  Laterality: N/A;  . PROSTATE BIOPSY  12/09/03   adenocarcinoma,glerason: 3+3=6  . TEE WITHOUT CARDIOVERSION N/A 06/19/2018   Procedure: TRANSESOPHAGEAL ECHOCARDIOGRAM (TEE);  Surgeon: Larey Dresser, MD;  Location: [facility] ENDOSCOPY;  Service: Cardiovascular;  Laterality: N/A;  . VAGOTOMY  1978   partial     Home Medications:  Prior to Admission medications   Medication Sig Start Date End Date Taking? Authorizing Provider  apixaban (ELIQUIS) 5 MG TABS tablet Take 2.5 mg by mouth 2 (two) times daily.   Yes [provider]  atorvastatin (LIPITOR) 20 MG tablet Take 1 tablet (20 mg total) by mouth daily. Patient taking differently: Take 20 mg by mouth every morning.  04/29/18 04/21/20 Yes Arrien, Jimmy Picket, MD  cholecalciferol (VITAMIN D3) 25 MCG (1000 UT) tablet Take 1,000 Units by mouth every morning.   Yes [provider]  lisinopril (ZESTRIL) 20 MG tablet Take 20 mg by mouth every morning.     Yes [provider]  metoprolol succinate (TOPROL-XL) 50 MG 24 hr tablet Take 25 mg by mouth every morning. Take with or immediately following a meal.    Yes [provider]  metFORMIN (GLUCOPHAGE) 500 MG tablet Take 1 tablet (500 mg total) by mouth 2 (two) times daily with a meal. Patient not taking: Reported on 04/22/2019 04/29/18 11/29/18  Arrien, Jimmy Picket, MD    Inpatient Medications: Scheduled Meds: . apixaban  2.5 mg Oral BID  . atorvastatin  20 mg Oral Daily  . furosemide  40 mg Intravenous Q12H  . hydrALAZINE  25 mg Oral Q8H  . insulin aspart  0-15 Units Subcutaneous TID WC  . insulin aspart  0-5 Units Subcutaneous QHS  . lisinopril  20 mg Oral q morning - 10a  . sodium chloride flush  3 mL Intravenous Once   Continuous Infusions: . sodium chloride Stopped (04/22/19 1600)   PRN Meds: acetaminophen **OR** acetaminophen, ondansetron **OR** ondansetron (ZOFRAN) IV  Allergies:    Allergies  Allergen  Reactions  . Aspirin Other (See Comments)    History of "stomach ulcers," was told to not take this     Social History:   Social History   Socioeconomic History  . Marital status: Widowed    Spouse name: Not on file  . Number of children: 2  . Years of education: Not on file  . Highest education level: Not on file  Occupational History  . 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 Financial resource strain: Not on file  . Food insecurity    Worry: Not on file    Inability: Not on file  . Transportation needs    Medical: Not on file    Non-medical: Not on file  Tobacco Use  . Smoking status: Former Smoker    Types: Pipe    Quit date: 07/05/1984    Years since quitting: 34.8  . Smokeless tobacco: Never Used  Substance and Sexual Activity  . Alcohol use: No  . Drug use: No  . Sexual activity: Never  Lifestyle  . Physical activity    Days per week: Not on file    Minutes per session: Not on file  . Stress:  Not on file  Relationships  . Social Herbalist on phone: Not on file    Gets together: Not on file    Attends religious service: Not on file    Active member of club or organization: Not on file    Attends meetings of clubs or organizations: Not on file    Relationship status: Not on file  . Intimate partner violence    Fear of current or ex partner: Not on file    Emotionally abused: Not on file    Physically abused: Not on file    Forced sexual activity: Not on file  Other Topics Concern  . Not on file  Social History Narrative   Pt lives in [place], alone.  Widower.   Retired Animator.    Family History:    Family History  Problem Relation Age of Onset  . Liver cancer Father   . Kidney Stones Father   . Colon cancer Neg Hx   . Stroke Neg Hx      ROS:  Please see the history of present illness.   All other ROS reviewed and negative.     Physical Exam/Data:   Vitals:   04/23/19 2009 04/23/19 2358 04/24/19 0738 04/24/19 0854  BP: (!) 148/89 (!) 164/98  124/75  Pulse: 72 (!) 59  74  Resp: 17 16  19   Temp: 98.4 F (36.9 C) 97.8 F (36.6 C)  98.4 F (36.9 C)  TempSrc: Oral Oral  Oral  SpO2: 96% 97%  95%  Weight:   105.5 kg   Height:        Intake/Output Summary (Last 24 hours) at 04/24/2019 1055 Last data filed at 04/24/2019 0857 Gross per 24 hour  Intake 837 ml  Output 800 ml  Net 37 ml   Filed Weights   04/22/19 1731 04/23/19 0555 04/24/19 0738  Weight: 109 kg 107.3 kg 105.5 kg   Body mass index is 31.53 kg/m.  General:  Well nourished, well developed, sitting in bedside chair in no acute distress HEENT: sclera anicteric  Neck: no JVD Vascular: No carotid bruits; distal pulses 2+ bilaterally Cardiac:  normal S1, S2; RRR; no murmurs, rubs, or gallops Lungs:  clear to auscultation bilaterally, no wheezing,  rhonchi or rales  Abd: NABS, soft, nontender, no hepatomegaly Ext: trace LE edema Musculoskeletal:  No deformities, BUE  and BLE strength normal and equal Skin: warm and dry  Neuro:  CNs 2-12 intact, no focal abnormalities noted Psych:  Normal affect   EKG:  The EKG was personally reviewed and demonstrates:  sinus rhythm, rate 71, chronic RBBB, LAD, no STE/D, no TWI Telemetry:  Telemetry was personally reviewed and demonstrates:  Sinus rhythm with nocturnal bradycardia down to 40s.   Relevant CV Studies: Echocardiogram 04/23/2019: 1. Left ventricular ejection fraction, by visual estimation, is 35 to 40%. The left ventricle has moderately decreased function. Moderately increased left ventricular size. There is no left ventricular hypertrophy.  2. Diffuse hypokinesis worse in inferior wall.  3. Global right ventricle has normal systolic function.The right ventricular size is normal. No increase in right ventricular wall thickness.  4. Left atrial size was normal.  5. Right atrial size was normal.  6. The mitral valve is normal in structure. Mild to moderate mitral valve regurgitation.  7. Eccentric anteriorly directed and not well characterized by color flow.  8. The tricuspid valve is normal in structure. Tricuspid valve regurgitation moderate.  9. The aortic valve is tricuspid Aortic valve regurgitation is mild by color flow Doppler. Mild to moderate aortic valve sclerosis/calcification without any evidence of aortic stenosis. 10. The pulmonic valve was grossly normal. Pulmonic valve regurgitation is mild by color flow Doppler. 11. Moderately elevated pulmonary artery systolic pressure.  Laboratory Data:  Chemistry Recent Labs  Lab 04/22/19 1142 04/23/19 0349 04/24/19 0944  NA 140 142 141  K 4.2 3.5 3.5  CL 107 104 100  CO2 22 25 28   GLUCOSE 138* 117* 225*  BUN 20 21 24*  CREATININE 1.18 1.30* 1.59*  CALCIUM 8.8* 8.7* 8.7*  GFRNONAA 57* 51* 40*  GFRAA >60 59* 46*  ANIONGAP 11 13 13     Recent Labs  Lab 04/22/19 1142  PROT 7.1  ALBUMIN 3.8  AST 25  ALT 24  ALKPHOS 87  BILITOT 0.8    Hematology Recent Labs  Lab 04/22/19 1142 04/23/19 0349 04/24/19 0944  WBC 6.9 6.7 6.4  RBC 4.27 4.27 4.50  HGB 12.8* 13.0 13.6  HCT 40.1 39.9 41.6  MCV 93.9 93.4 92.4  MCH 30.0 30.4 30.2  MCHC 31.9 32.6 32.7  RDW 13.1 13.1 13.1  PLT 199 221 210   Cardiac EnzymesNo results for input(s): TROPONINI in the last 168 hours. No results for input(s): TROPIPOC in the last 168 hours.  BNP Recent Labs  Lab 04/22/19 1759  BNP 671.8*    DDimer No results for input(s): DDIMER in the last 168 hours.  Radiology/Studies:  Dg Chest 2 View  Result Date: 04/22/2019 CLINICAL DATA:  84 year old male with history of shortness of breath. EXAM: CHEST - 2 VIEW COMPARISON:  Chest x-ray 10/30/2018. FINDINGS: Lung volumes are normal. No consolidative airspace disease. No pleural effusions. No pneumothorax. No pulmonary nodule or mass noted. Pulmonary vasculature is normal. Heart size is mildly enlarged. Upper mediastinal contours are within normal limits. IMPRESSION: 1. No radiographic evidence of acute cardiopulmonary disease. 2. Mild cardiomegaly. Electronically Signed   By: Vinnie Langton M.D.   On: 04/22/2019 13:28    Assessment and Plan:   1. Acute on chronic combined CHF: patient presented with SOB, LE edema, orthopnea, and PND. BNP initially in the 600s>100s. CXR without overt edema. He was started on IV lasix with UOP net -2.4L this admission. Weight is down  8lbs.  Echo with drop in EF to 35-40% from 50-55% 09/2018. He was markedly hypertensive on arrival. Prior systolic dysfunction was felt to be tachycardia medicated in the setting of atrial fibrillation, however he has been maintaining sinus rhythm this admission. Possible this is HTN mediated, though suspect ischemic etiology. (CT from 2019 showed coronary calcifications of all 3 coronary arteries)   He has no chest pain complaints but progressive DOE could be anginal equivalent.  Overall he appears to be euvolemic on exam. - Will plan for Right  and Left HC to further evaluate newly reduced EF - Will hold lisinopril given uptrending trop - Will restart low does metoprolol succinate - no symptomatic bradycardia.  - Will hold diuresis at this time and gently hydrate in preparation for LHC.  - Could consider transition to entresto if Cr improves post cath.   2. Paroxysmal atrial fibrillation: s/p TEE/DCCV 06/2018  maintaining sinus rhythm this admission. Not on any AV nodal blocking agents as home metoprolol was held. On eliquis for CHA2DS2-VASc Score of at least 5 (CHF, HTN, DM type 2, and Age >75) - Continue eliquis for stroke ppx - dose adjusted for age and Cr. Also with hx of epistaxis which has improved since lowing eliquis dose.  - Will restart metoprolol succinate  3. Hypertensive urgency: BP markedly elevated on admission, improved today after starting hydralazine 25mg  TID. He has been continued on lisinopril 20mg  daily and home metoprolol was discontinued presumably due to bradycardia. Possible his cough is related to lisinopril.  - Trial off lisinopril to see if cough improves - Will increase hydralazine to 50mg  TID for now, though TID dosing not ideal for patient with medication compliance issues.  - Could consider transition to entresto if Cr improves  4. HLD: LDL 71 this admission; near goal of <70 - Continue atorvastatin  5. DM type 2: Hgb A1C 6.5 this admission; at goal of <7 - Continue management per primary team.  6. CKD stage 3: Cr 1.18>1.30>1.59; baseline 0.9-1.3 - Continue to monitor closely with diuresis.   For questions or updates, please contact [place] Please consult [email or web address] for contact info under Cardiology/STEMI.   Signed, Abigail Butts, PA-C  04/24/2019 10:55 AM [phone]  Pt seen and examined   I agree with findings as noted above by Teodoro Kil.  Pt is an 84 yo with hx of HTN, HL, PAF, CHF in past though LVEF normalized   Admitted with progressive SOB and weakness    Notes cough  productive of clearish sputum for a couple wks.  SInce admt he has been in [place]    He has been diuresed and his symptoms have improved some  Echo done which shows LVEF is down, probably 35 to 40%  LV is dilated  There is probably moderater MR   LA is dilated  Difficult windows but appears infeiror, inferolateral walls not moving as well  On exam, pt is comfortable Neck:  JVP appears normal Lungs  Rhonchi Cardiac exam:  RRR  No S3    No signi murmurs    Abd is benign Ext are without edema   Impression:  CHF with new LVEF dysfunction  Unclear  Etiology for drop in EF    WOrrisom for ischemia   CT scan of chest in past showed significant coronary artery calcifications.   Reviewed with pt    I would recomm R and L heart cath to define coronary anatomy Would hold further diuresis and actually hydrate givne bump  in creatinine. Hold Eliquis for now given plans for procedure  Risks /benefits described   Pt understands and agrees to proceed with R and L heart cath. Repeat labs tonight and in AM  Dorris Carnes MD

## 2019-04-24 NOTE — H&P (View-Only) (Signed)
Cardiology Consultation:   Patient ID: [name]; [id number]; 03-Mar-1935   Admit date: 04/22/2019 Date of Consult: 04/24/2019  Primary Care Provider: [name], MD Primary Cardiologist: Calvin Mocha, MD Primary Electrophysiologist:  Calvin Grayer, MD   Patient Profile:   [name] is a 84 y.o. male with a PMH of chronic combined CHF, paroxysmal atrial fibrillation on Eliquis, HTN, HLD, pulmonary artery HTN, moderate MR, DM type 2, CKD stage 3, who is being seen today for the evaluation of acute on chronic combined CHF at the request of [name].  History of Present Illness:   [name] was in his usual state of health until 6 weeks ago when he developed progressive DOE and generalized weakness. He also had complaints of orthopnea, PND, LE edema, and intermittent productive cough. Given progressive symptoms, he presented to the ED for further evaluation.   He was last evaluated by cardiology via a telemedicine visit with Calvin Dopp, PA-C 11/2018, at which time he was doing well from a cardiac standpoint. He has had multiple admissions over the past year. He was admitted from 04/27/2018-04/29/2018 for acute on chronic diastolic CHF which improved with IV diuresis. He then was admitted from 06/13/2018-06/22/2018 with new onset atrial fibrillation and acute combined CHF, felt to be tachycardia mediated. He underwent TEE/DCCV with successful return to NSR. Echo that admission revealed EF 20% with diffuse hypokinesis with severe hypokineisis of the basal-midinferior myocardium. He had a repeat echocardiogram 09/2018 which showed improvement in EF to 50-55% and normal LV diastolic parameters. He then was admitted from 10/30/2018-10/31/2018 after a syncopal event felt to be 2/2 vasovagal syncope. His last ischemic evaluation appears to be a NST in 2016 which was without ischemia.  At the time of this evaluation he reports improvement in SOB and LE edema. His cough is the most bothersome  symptom at this point. He is fairly active at baseline. 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 He has 4 floors and has noticed increased SOB with climbing stairs in recent weeks. No complaints of chest pain, palpitations, dizziness, lightheadedness, or syncope. He reported recent visit with visit with his PCP where his medications were whittled down. Since his last cardiology visit, he is no longer taking spironolactone, lasix, or amiodarone. He admits to medication non-compliance, missing a few doses of medications a week, including his eliquis.    Hospital course: Markedly hypertensive on arrival which is overall improved today, intermittently tachypneic to 20s and bradycardic to 50s, otherwise VSS. Labs notable for electrolytes wnl, Cr 1.18>1.3>1.59, CBC wnl, Trop 36>43>40, BNP 671>163, TSH wnl, Hgb A1C 6.3. EKG with sinus rhythm, rate 71, chronic RBBB, LAD, no STE/D, no TWI. Echo 04/23/2019 with EF 35-40% (previously 50-55% 09/2018), moderate LVH, diffuse hypokinesis worse in the inferior wall, normal RV function, and mild-moderate MR. Patient was started on IV lasix 40mg  BID with UOP net -2.4L this admission. Weight down to 232lbs from 240lbs on admission. Cardiology asked to evaluate for acute on chronic combined CHF.   Past Medical History:  Diagnosis Date  . A-fib ([place])    RVR-DCCV  . Abnormal PSA 01/17/2012   10.38/4/5/ PSA:2013=6.96/ PSA 07/08/2011=6.26/ PSA 2006=8.6  . 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 Blood transfusion without reported diagnosis    during vagotomy 1976  . Cancer [facility]) 2006   L-2 solitary metastatic lesion/no rad tx ,asymptomatic  . Chronic kidney disease    nephrolithiasis right kidney  . ED (erectile dysfunction)   .  GERD (gastroesophageal reflux disease)    PUD S/P PARTIAL VAGOTOMY  . Gynecomastia 04/21/11   seen by [name], no rad tx  . H/O impacted cerumen    S/P ENT DR Mullins  . History of echocardiogram    echo 4/16:  mild LVH, EF 55-60%, Gr  1 DD, no RWMA, mild AI, mild MR, mod LAE, mild TR  . Hx of cardiovascular stress test    Myoview 5/16:  EF 50%, no scar or ischemia, low risk  . Hydroureteronephrosis    right   . Hyperglycemia    WITHOUT DM2  . Hyperlipidemia   . Hypertension   . Insomnia   . Migraines    WITH AURA  . Nephrolithiasis   . Osteopenia   . Overweight   . Prostate cancer ([place]) 12/09/03 dx   Prostate ca/adenocarcinoma,gleason=3+3=6    pSA 4.6  . Prostate cancer ([place])    METASTATIC WITH L2 SCLEROTIC LESION ON LUPRON------[name]  . RBBB (right bundle branch block)   . Rosacea   . Sinus bradycardia   . Sleep apnea    pt unaware  . Stroke [facility])    asymptomatic, found on head imaging  . Ulcer    peptic  . Use of leuprolide acetate (Lupron) 06/2005-11/2006   02/2008 degarelix 02/2008 x 3 doses    Past Surgical History:  Procedure Laterality Date  . CARDIOVERSION N/A 06/19/2018   Procedure: CARDIOVERSION;  Surgeon: Calvin Dresser, MD;  Location: [facility] ENDOSCOPY;  Service: Cardiovascular;  Laterality: N/A;  . PROSTATE BIOPSY  12/09/03   adenocarcinoma,glerason: 3+3=6  . TEE WITHOUT CARDIOVERSION N/A 06/19/2018   Procedure: TRANSESOPHAGEAL ECHOCARDIOGRAM (TEE);  Surgeon: Calvin Dresser, MD;  Location: [facility] ENDOSCOPY;  Service: Cardiovascular;  Laterality: N/A;  . VAGOTOMY  1978   partial     Home Medications:  Prior to Admission medications   Medication Sig Start Date End Date Taking? Authorizing Provider  apixaban (ELIQUIS) 5 MG TABS tablet Take 2.5 mg by mouth 2 (two) times daily.   Yes [provider]  atorvastatin (LIPITOR) 20 MG tablet Take 1 tablet (20 mg total) by mouth daily. Patient taking differently: Take 20 mg by mouth every morning.  04/29/18 04/21/20 Yes Arrien, Jimmy Picket, MD  cholecalciferol (VITAMIN D3) 25 MCG (1000 UT) tablet Take 1,000 Units by mouth every morning.   Yes [provider]  lisinopril (ZESTRIL) 20 MG tablet Take 20 mg by mouth every morning.     Yes [provider]  metoprolol succinate (TOPROL-XL) 50 MG 24 hr tablet Take 25 mg by mouth every morning. Take with or immediately following a meal.    Yes [provider]  metFORMIN (GLUCOPHAGE) 500 MG tablet Take 1 tablet (500 mg total) by mouth 2 (two) times daily with a meal. Patient not taking: Reported on 04/22/2019 04/29/18 11/29/18  Arrien, Jimmy Picket, MD    Inpatient Medications: Scheduled Meds: . apixaban  2.5 mg Oral BID  . atorvastatin  20 mg Oral Daily  . furosemide  40 mg Intravenous Q12H  . hydrALAZINE  25 mg Oral Q8H  . insulin aspart  0-15 Units Subcutaneous TID WC  . insulin aspart  0-5 Units Subcutaneous QHS  . lisinopril  20 mg Oral q morning - 10a  . sodium chloride flush  3 mL Intravenous Once   Continuous Infusions: . sodium chloride Stopped (04/22/19 1600)   PRN Meds: acetaminophen **OR** acetaminophen, ondansetron **OR** ondansetron (ZOFRAN) IV  Allergies:    Allergies  Allergen  Reactions  . Aspirin Other (See Comments)    History of "stomach ulcers," was told to not take this     Social History:   Social History   Socioeconomic History  . Marital status: Widowed    Spouse name: Not on file  . Number of children: 2  . Years of education: Not on file  . Highest education level: Not on file  Occupational History  . 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 Financial resource strain: Not on file  . Food insecurity    Worry: Not on file    Inability: Not on file  . Transportation needs    Medical: Not on file    Non-medical: Not on file  Tobacco Use  . Smoking status: Former Smoker    Types: Pipe    Quit date: 07/05/1984    Years since quitting: 34.8  . Smokeless tobacco: Never Used  Substance and Sexual Activity  . Alcohol use: No  . Drug use: No  . Sexual activity: Never  Lifestyle  . Physical activity    Days per week: Not on file    Minutes per session: Not on file  . Stress:  Not on file  Relationships  . Social Herbalist on phone: Not on file    Gets together: Not on file    Attends religious service: Not on file    Active member of club or organization: Not on file    Attends meetings of clubs or organizations: Not on file    Relationship status: Not on file  . Intimate partner violence    Fear of current or ex partner: Not on file    Emotionally abused: Not on file    Physically abused: Not on file    Forced sexual activity: Not on file  Other Topics Concern  . Not on file  Social History Narrative   Pt lives in [place], alone.  Widower.   Retired Animator.    Family History:    Family History  Problem Relation Age of Onset  . Liver cancer Father   . Kidney Stones Father   . Colon cancer Neg Hx   . Stroke Neg Hx      ROS:  Please see the history of present illness.   All other ROS reviewed and negative.     Physical Exam/Data:   Vitals:   04/23/19 2009 04/23/19 2358 04/24/19 0738 04/24/19 0854  BP: (!) 148/89 (!) 164/98  124/75  Pulse: 72 (!) 59  74  Resp: 17 16  19   Temp: 98.4 F (36.9 C) 97.8 F (36.6 C)  98.4 F (36.9 C)  TempSrc: Oral Oral  Oral  SpO2: 96% 97%  95%  Weight:   105.5 kg   Height:        Intake/Output Summary (Last 24 hours) at 04/24/2019 1055 Last data filed at 04/24/2019 0857 Gross per 24 hour  Intake 837 ml  Output 800 ml  Net 37 ml   Filed Weights   04/22/19 1731 04/23/19 0555 04/24/19 0738  Weight: 109 kg 107.3 kg 105.5 kg   Body mass index is 31.53 kg/m.  General:  Well nourished, well developed, sitting in bedside chair in no acute distress HEENT: sclera anicteric  Neck: no JVD Vascular: No carotid bruits; distal pulses 2+ bilaterally Cardiac:  normal S1, S2; RRR; no murmurs, rubs, or gallops Lungs:  clear to auscultation bilaterally, no wheezing,  rhonchi or rales  Abd: NABS, soft, nontender, no hepatomegaly Ext: trace LE edema Musculoskeletal:  No deformities, BUE  and BLE strength normal and equal Skin: warm and dry  Neuro:  CNs 2-12 intact, no focal abnormalities noted Psych:  Normal affect   EKG:  The EKG was personally reviewed and demonstrates:  sinus rhythm, rate 71, chronic RBBB, LAD, no STE/D, no TWI Telemetry:  Telemetry was personally reviewed and demonstrates:  Sinus rhythm with nocturnal bradycardia down to 40s.   Relevant CV Studies: Echocardiogram 04/23/2019: 1. Left ventricular ejection fraction, by visual estimation, is 35 to 40%. The left ventricle has moderately decreased function. Moderately increased left ventricular size. There is no left ventricular hypertrophy.  2. Diffuse hypokinesis worse in inferior wall.  3. Global right ventricle has normal systolic function.The right ventricular size is normal. No increase in right ventricular wall thickness.  4. Left atrial size was normal.  5. Right atrial size was normal.  6. The mitral valve is normal in structure. Mild to moderate mitral valve regurgitation.  7. Eccentric anteriorly directed and not well characterized by color flow.  8. The tricuspid valve is normal in structure. Tricuspid valve regurgitation moderate.  9. The aortic valve is tricuspid Aortic valve regurgitation is mild by color flow Doppler. Mild to moderate aortic valve sclerosis/calcification without any evidence of aortic stenosis. 10. The pulmonic valve was grossly normal. Pulmonic valve regurgitation is mild by color flow Doppler. 11. Moderately elevated pulmonary artery systolic pressure.  Laboratory Data:  Chemistry Recent Labs  Lab 04/22/19 1142 04/23/19 0349 04/24/19 0944  NA 140 142 141  K 4.2 3.5 3.5  CL 107 104 100  CO2 22 25 28   GLUCOSE 138* 117* 225*  BUN 20 21 24*  CREATININE 1.18 1.30* 1.59*  CALCIUM 8.8* 8.7* 8.7*  GFRNONAA 57* 51* 40*  GFRAA >60 59* 46*  ANIONGAP 11 13 13     Recent Labs  Lab 04/22/19 1142  PROT 7.1  ALBUMIN 3.8  AST 25  ALT 24  ALKPHOS 87  BILITOT 0.8    Hematology Recent Labs  Lab 04/22/19 1142 04/23/19 0349 04/24/19 0944  WBC 6.9 6.7 6.4  RBC 4.27 4.27 4.50  HGB 12.8* 13.0 13.6  HCT 40.1 39.9 41.6  MCV 93.9 93.4 92.4  MCH 30.0 30.4 30.2  MCHC 31.9 32.6 32.7  RDW 13.1 13.1 13.1  PLT 199 221 210   Cardiac EnzymesNo results for input(s): TROPONINI in the last 168 hours. No results for input(s): TROPIPOC in the last 168 hours.  BNP Recent Labs  Lab 04/22/19 1759  BNP 671.8*    DDimer No results for input(s): DDIMER in the last 168 hours.  Radiology/Studies:  Dg Chest 2 View  Result Date: 04/22/2019 CLINICAL DATA:  84 year old male with history of shortness of breath. EXAM: CHEST - 2 VIEW COMPARISON:  Chest x-ray 10/30/2018. FINDINGS: Lung volumes are normal. No consolidative airspace disease. No pleural effusions. No pneumothorax. No pulmonary nodule or mass noted. Pulmonary vasculature is normal. Heart size is mildly enlarged. Upper mediastinal contours are within normal limits. IMPRESSION: 1. No radiographic evidence of acute cardiopulmonary disease. 2. Mild cardiomegaly. Electronically Signed   By: Vinnie Langton M.D.   On: 04/22/2019 13:28    Assessment and Plan:   1. Acute on chronic combined CHF: patient presented with SOB, LE edema, orthopnea, and PND. BNP initially in the 600s>100s. CXR without overt edema. He was started on IV lasix with UOP net -2.4L this admission. Weight is down  8lbs.  Echo with drop in EF to 35-40% from 50-55% 09/2018. He was markedly hypertensive on arrival. Prior systolic dysfunction was felt to be tachycardia medicated in the setting of atrial fibrillation, however he has been maintaining sinus rhythm this admission. Possible this is HTN mediated, though suspect ischemic etiology. (CT from 2019 showed coronary calcifications of all 3 coronary arteries)   He has no chest pain complaints but progressive DOE could be anginal equivalent.  Overall he appears to be euvolemic on exam. - Will plan for Right  and Left HC to further evaluate newly reduced EF - Will hold lisinopril given uptrending trop - Will restart low does metoprolol succinate - no symptomatic bradycardia.  - Will hold diuresis at this time and gently hydrate in preparation for LHC.  - Could consider transition to entresto if Cr improves post cath.   2. Paroxysmal atrial fibrillation: s/p TEE/DCCV 06/2018  maintaining sinus rhythm this admission. Not on any AV nodal blocking agents as home metoprolol was held. On eliquis for CHA2DS2-VASc Score of at least 5 (CHF, HTN, DM type 2, and Age >75) - Continue eliquis for stroke ppx - dose adjusted for age and Cr. Also with hx of epistaxis which has improved since lowing eliquis dose.  - Will restart metoprolol succinate  3. Hypertensive urgency: BP markedly elevated on admission, improved today after starting hydralazine 25mg  TID. He has been continued on lisinopril 20mg  daily and home metoprolol was discontinued presumably due to bradycardia. Possible his cough is related to lisinopril.  - Trial off lisinopril to see if cough improves - Will increase hydralazine to 50mg  TID for now, though TID dosing not ideal for patient with medication compliance issues.  - Could consider transition to entresto if Cr improves  4. HLD: LDL 71 this admission; near goal of <70 - Continue atorvastatin  5. DM type 2: Hgb A1C 6.5 this admission; at goal of <7 - Continue management per primary team.  6. CKD stage 3: Cr 1.18>1.30>1.59; baseline 0.9-1.3 - Continue to monitor closely with diuresis.   For questions or updates, please contact [place] Please consult [email or web address] for contact info under Cardiology/STEMI.   Signed, Abigail Butts, PA-C  04/24/2019 10:55 AM [phone]  Pt seen and examined   I agree with findings as noted above by Teodoro Kil.  Pt is an 84 yo with hx of HTN, HL, PAF, CHF in past though LVEF normalized   Admitted with progressive SOB and weakness    Notes cough  productive of clearish sputum for a couple wks.  SInce admt he has been in [place]    He has been diuresed and his symptoms have improved some  Echo done which shows LVEF is down, probably 35 to 40%  LV is dilated  There is probably moderater MR   LA is dilated  Difficult windows but appears infeiror, inferolateral walls not moving as well  On exam, pt is comfortable Neck:  JVP appears normal Lungs  Rhonchi Cardiac exam:  RRR  No S3    No signi murmurs    Abd is benign Ext are without edema   Impression:  CHF with new LVEF dysfunction  Unclear  Etiology for drop in EF    WOrrisom for ischemia   CT scan of chest in past showed significant coronary artery calcifications.   Reviewed with pt    I would recomm R and L heart cath to define coronary anatomy Would hold further diuresis and actually hydrate givne bump  in creatinine. Hold Eliquis for now given plans for procedure  Risks /benefits described   Pt understands and agrees to proceed with R and L heart cath. Repeat labs tonight and in AM  Dorris Carnes MD

## 2019-04-24 NOTE — Progress Notes (Signed)
Lab informed RN that Patient refused lab draw this morning.

## 2019-04-24 NOTE — Progress Notes (Signed)
PROGRESS NOTE  [name] [id number] DOB: 1934/12/10 DOA: 04/22/2019 PCP: [name], MD  Brief History   [name] is a 84 y.o. male with medical history significant of hypertension, diabetes, A. Fib-on Eliquis, CKD stage III, chronic systolic congestive heart failure, hyperlipidemia, RBBB, presents to emergency department due to worsening exertional shortness of breath, generalized weakness since 6 weeks.  Patient reports shortness of breath associated with mild exertion, orthopnea, PND, leg swelling, intermittent productive cough with whitish sputum.  Denies chest pain, palpitation, headache, blurry vision, fever, chills, sore throat, runny nose, nausea, vomiting, epigastric pain, diarrhea, decreased appetite, urinary or bowel changes.  I reviewed his home meds-he is currently not on any diuretics.  He lives alone and he rents out spare rooms in his house with air PND.  Denies recent travel, COVID-19 exposure, smoking, alcohol, illicit drug use.  Reports being compliant with his medications.  Does not see any cardiologist outpatient.  ED Course: Upon arrival: His blood pressure was elevated.  Troponin trended up from 36-43.  EKG shows normal sinus rhythm, right bundle branch block, left axis deviation.  Chest x-ray was obtained which came back negative for fluid overload.  Patient received IV labetalol for his elevated blood pressure.  The patient was admitted to a telemetry bed. He has been diuresed, and his fluid balance is negative by 2312.6 cc this morning. Echocardiogram was obtained and demonstrated new findings of EF of 30-35% and diffuse global hypokinesis. Cardiology has been consulted.  Consultants  . None  Procedures  . None  Antibiotics   Anti-infectives (From admission, onward)   None     Subjective  The patient states that his breathing is better, and edema of legs has decreased.  Objective   Vitals:  Vitals:   04/24/19 0854 04/24/19 1137  BP: 124/75  130/85  Pulse: 74 (!) 51  Resp: 19 17  Temp: 98.4 F (36.9 C) (!) 97.3 F (36.3 C)  SpO2: 95% 96%   Exam:  Constitutional:  . The patient is awake, alert, and oriented x 3. No acute distress. Respiratory:  . No increased work of breathing. . No wheezes, rales, or rhonchi . No tactile fremitus Cardiovascular:  . Regular rate and rhythm . No murmurs, ectopy, or gallups. . No lateral PMI. No thrills. Abdomen:  . Abdomen is soft, non-tender, non-distended. . The abdomen is morbidly obese. . No hernias, masses, or organomegaly . Normoactive bowel sounds.  Musculoskeletal:  No cyanosis or clubbing Lower extremities 2+ pitting edema bilaterally. . No rashes, lesions, ulcers . palpation of skin: no induration or nodules Neurologic:  . CN 2-12 intact . Sensation all 4 extremities intact Psychiatric:  . Mental status o Mood, affect appropriate o Orientation to person, place, time  . judgment and insight appear intact  I have personally reviewed the following:   Today's Data  Vitals, BMP, CBC  Cardiology Data  . EKG, Echocardiogram is pending.  Other Data   Scheduled Meds: . apixaban  2.5 mg Oral BID  . atorvastatin  20 mg Oral Daily  . furosemide  40 mg Intravenous Q12H  . hydrALAZINE  25 mg Oral Q8H  . insulin aspart  0-15 Units Subcutaneous TID WC  . insulin aspart  0-5 Units Subcutaneous QHS  . lisinopril  20 mg Oral q morning - 10a  . sodium chloride flush  3 mL Intravenous Once   Continuous Infusions: . sodium chloride Stopped (04/22/19 1600)    Active Problems:   CKD (chronic kidney disease),  stage III   HLD (hyperlipidemia)   Elevated troponin   Diabetes mellitus without complication (HCC)   HTN (hypertension)   Atrial fibrillation (HCC)   Chronic systolic CHF (congestive heart failure) (HCC)   Weakness generalized   LOS: 0 days   A & P  Acute on chronic CHF: The patient was admitted to a telemetry bed. He has been diuresed, and his fluid  balance is negative by 2449 cc this morning. Patient presented with worsening shortness of breath, orthopnea, PND and leg swelling. He is currently saturating in the low to mid nineties on room air. He continues to have severe lower extremity edema. Repeat echocardiogram has been ordered. Echo performed in March of [id number] had an systolic EF of [id number] and no evidence of diastolic dysfunction, and normal right systolic failure. Chest x-ray was negative for fluid overload.  The patient's volume status is being monitored by strict INO's and daily weight. Creatinine and electrolytes are also being carefully  I have reviewed echo from 09/2018 which shows ejection fraction of 50 to 55%. TTE performed on 04/23/2019 demonstrated a dramatically lower EF of 30-35% with global hypokinesis. Cardiology has been consulted. I appreciate that assistance.He will be continued on lisinopril, metoprolol, and statin. He is receiving Lasix 40 mg IV twice daily.   Accelerated hypertension: Blood pressure is elevated upon arrival. The patient's hypertension is currently well controlled on lisinopril and hydralazine. Metoprolol has been stopped due to the patient's apparent exacerbation of CHF. and hydralazine were also given on the floor. Cardiology has been consulted.  Paroxysmal A. fib: Rate controlled The patient is being monitored on telemetry. Continue Eliquis. Stop metoprolol due to CHF exacerbation.   Elevated troponin: Troponins have remained flat. No complaints of chest pain.  Diabetes mellitus: Check A1c. Metformin held for now. Glucoses have run 82 - 126 in the last 24 hours with correction insulin onlhy.  Hyperlipidemia: Continue atorvastatin.  AKI on CKDIII Baseline creatinine appears to be CKD stage III: Baseline creatinine appears to be about 1, although it has frequently been between 1.2 and 1.4 in the past few months. Creatinine 1.59 this morning. Monitor creatinine, electrolytes, and volume status. Avoid  nephrotoxic medications and hypotension.   I have seen and examined this patient myself. I have spent 34 minutes in his evaluation and care.  DVT prophylaxis: TED/SCD/Eliquis Code Status: Full code  Family Communication: None present at bedside.  Disposition Plan: Likely home in 1 to 2 days   Jacia Sickman, DO [facility] Direct contact: see [email or web address]  7PM-7AM contact night coverage as above 04/24/2019, 1:35 PM  LOS: 0 days

## 2019-04-25 ENCOUNTER — Encounter (HOSPITAL_COMMUNITY)
Admission: EM | Disposition: A | Discharge status: Home / Self Care | Payer: Self-pay | Source: Home / Self Care | Attending: Internal Medicine

## 2019-04-25 ENCOUNTER — Encounter (HOSPITAL_COMMUNITY): Payer: Self-pay | Admitting: Cardiology

## 2019-04-25 DIAGNOSIS — R609 Edema, unspecified: Secondary | ICD-10-CM

## 2019-04-25 DIAGNOSIS — R778 Other specified abnormalities of plasma proteins: Secondary | ICD-10-CM

## 2019-04-25 DIAGNOSIS — N1831 Chronic kidney disease, stage 3a: Secondary | ICD-10-CM

## 2019-04-25 DIAGNOSIS — R600 Localized edema: Secondary | ICD-10-CM

## 2019-04-25 DIAGNOSIS — I10 Essential (primary) hypertension: Secondary | ICD-10-CM

## 2019-04-25 HISTORY — PX: RIGHT/LEFT HEART CATH AND CORONARY ANGIOGRAPHY: CATH118266

## 2019-04-25 LAB — POCT I-STAT EG7
Acid-Base Excess: 4 mmol/L — ABNORMAL HIGH (ref 0.0–2.0)
Acid-Base Excess: 6 mmol/L — ABNORMAL HIGH (ref 0.0–2.0)
Bicarbonate: 29.5 mmol/L — ABNORMAL HIGH (ref 20.0–28.0)
Bicarbonate: 32 mmol/L — ABNORMAL HIGH (ref 20.0–28.0)
Calcium, Ion: 1.11 mmol/L — ABNORMAL LOW (ref 1.15–1.40)
Calcium, Ion: 1.17 mmol/L (ref 1.15–1.40)
HCT: 40 % (ref 39.0–52.0)
HCT: 40 % (ref 39.0–52.0)
Hemoglobin: 13.6 g/dL (ref 13.0–17.0)
Hemoglobin: 13.6 g/dL (ref 13.0–17.0)
O2 Saturation: 76 %
O2 Saturation: 77 %
Potassium: 3.4 mmol/L — ABNORMAL LOW (ref 3.5–5.1)
Potassium: 3.4 mmol/L — ABNORMAL LOW (ref 3.5–5.1)
Sodium: 141 mmol/L (ref 135–145)
Sodium: 142 mmol/L (ref 135–145)
TCO2: 31 mmol/L (ref 22–32)
TCO2: 34 mmol/L — ABNORMAL HIGH (ref 22–32)
pCO2, Ven: 47.6 mmHg (ref 44.0–60.0)
pCO2, Ven: 52 mmHg (ref 44.0–60.0)
pH, Ven: 7.397 (ref 7.250–7.430)
pH, Ven: 7.401 (ref 7.250–7.430)
pO2, Ven: 42 mmHg (ref 32.0–45.0)
pO2, Ven: 42 mmHg (ref 32.0–45.0)

## 2019-04-25 LAB — BASIC METABOLIC PANEL
Anion gap: 11 (ref 5–15)
BUN: 26 mg/dL — ABNORMAL HIGH (ref 8–23)
CO2: 28 mmol/L (ref 22–32)
Calcium: 8.6 mg/dL — ABNORMAL LOW (ref 8.9–10.3)
Chloride: 100 mmol/L (ref 98–111)
Creatinine, Ser: 1.46 mg/dL — ABNORMAL HIGH (ref 0.61–1.24)
GFR calc Af Amer: 51 mL/min — ABNORMAL LOW (ref >60)
GFR calc non Af Amer: 44 mL/min — ABNORMAL LOW (ref >60)
Glucose, Bld: 118 mg/dL — ABNORMAL HIGH (ref 70–99)
Potassium: 3.6 mmol/L (ref 3.5–5.1)
Sodium: 139 mmol/L (ref 135–145)

## 2019-04-25 LAB — CBC
HCT: 43.3 % (ref 39.0–52.0)
Hemoglobin: 13.7 g/dL (ref 13.0–17.0)
MCH: 29.1 pg (ref 26.0–34.0)
MCHC: 31.6 g/dL (ref 30.0–36.0)
MCV: 92.1 fL (ref 80.0–100.0)
Platelets: 211 10*3/uL (ref 150–400)
RBC: 4.7 MIL/uL (ref 4.22–5.81)
RDW: 13 % (ref 11.5–15.5)
WBC: 7.2 10*3/uL (ref 4.0–10.5)
nRBC: 0 % (ref 0.0–0.2)

## 2019-04-25 LAB — POCT I-STAT 7, (LYTES, BLD GAS, ICA,H+H)
Acid-Base Excess: 4 mmol/L — ABNORMAL HIGH (ref 0.0–2.0)
Bicarbonate: 29.2 mmol/L — ABNORMAL HIGH (ref 20.0–28.0)
Calcium, Ion: 1.17 mmol/L (ref 1.15–1.40)
HCT: 41 % (ref 39.0–52.0)
Hemoglobin: 13.9 g/dL (ref 13.0–17.0)
O2 Saturation: 98 %
Potassium: 3.5 mmol/L (ref 3.5–5.1)
Sodium: 141 mmol/L (ref 135–145)
TCO2: 31 mmol/L (ref 22–32)
pCO2 arterial: 46 mmHg (ref 32.0–48.0)
pH, Arterial: 7.411 (ref 7.350–7.450)
pO2, Arterial: 106 mmHg (ref 83.0–108.0)

## 2019-04-25 LAB — GLUCOSE, CAPILLARY
Glucose-Capillary: 131 mg/dL — ABNORMAL HIGH (ref 70–99)
Glucose-Capillary: 124 mg/dL — ABNORMAL HIGH (ref 70–99)
Glucose-Capillary: 110 mg/dL — ABNORMAL HIGH (ref 70–99)
Glucose-Capillary: 110 mg/dL — ABNORMAL HIGH (ref 70–99)

## 2019-04-25 SURGERY — RIGHT/LEFT HEART CATH AND CORONARY ANGIOGRAPHY
Anesthesia: LOCAL

## 2019-04-25 MED ORDER — SODIUM CHLORIDE 0.9% FLUSH
3.0000 mL | Freq: Two times a day (BID) | INTRAVENOUS | Status: DC
  Administered 2019-04-25 – 2019-04-26 (×3): 3 mL via INTRAVENOUS

## 2019-04-25 MED ORDER — HEPARIN (PORCINE) IN NACL 1000-0.9 UT/500ML-% IV SOLN
INTRAVENOUS | Status: AC
  Filled 2019-04-25: qty 1000

## 2019-04-25 MED ORDER — IOHEXOL 350 MG/ML SOLN
INTRAVENOUS | Status: DC | PRN
  Administered 2019-04-25: 135 mL

## 2019-04-25 MED ORDER — LIDOCAINE HCL (PF) 1 % IJ SOLN
INTRAMUSCULAR | Status: AC
  Filled 2019-04-25: qty 30

## 2019-04-25 MED ORDER — FENTANYL CITRATE (PF) 100 MCG/2ML IJ SOLN
INTRAMUSCULAR | Status: DC | PRN
  Administered 2019-04-25: 25 ug via INTRAVENOUS

## 2019-04-25 MED ORDER — HEPARIN SODIUM (PORCINE) 1000 UNIT/ML IJ SOLN
INTRAMUSCULAR | Status: AC
  Filled 2019-04-25: qty 1

## 2019-04-25 MED ORDER — APIXABAN 2.5 MG PO TABS: 2.5000 mg | ORAL_TABLET | Freq: Two times a day (BID) | ORAL | Status: DC

## 2019-04-25 MED ORDER — ASPIRIN 81 MG PO CHEW
81.0000 mg | CHEWABLE_TABLET | ORAL | Status: AC
  Administered 2019-04-25: 81 mg via ORAL
  Filled 2019-04-25: qty 1

## 2019-04-25 MED ORDER — MIDAZOLAM HCL 2 MG/2ML IJ SOLN
INTRAMUSCULAR | Status: DC | PRN
  Administered 2019-04-25: 1 mg via INTRAVENOUS

## 2019-04-25 MED ORDER — SODIUM CHLORIDE 0.9% FLUSH: 3.0000 mL | INTRAVENOUS | Status: DC | PRN

## 2019-04-25 MED ORDER — FENTANYL CITRATE (PF) 100 MCG/2ML IJ SOLN
INTRAMUSCULAR | Status: AC
  Filled 2019-04-25: qty 2

## 2019-04-25 MED ORDER — MIDAZOLAM HCL 2 MG/2ML IJ SOLN
INTRAMUSCULAR | Status: AC
  Filled 2019-04-25: qty 2

## 2019-04-25 MED ORDER — HEPARIN SODIUM (PORCINE) 1000 UNIT/ML IJ SOLN
INTRAMUSCULAR | Status: DC | PRN
  Administered 2019-04-25: 5000 [IU] via INTRAVENOUS

## 2019-04-25 MED ORDER — POTASSIUM CHLORIDE CRYS ER 20 MEQ PO TBCR
40.0000 meq | EXTENDED_RELEASE_TABLET | Freq: Once | ORAL | Status: AC
  Administered 2019-04-25: 40 meq via ORAL
  Filled 2019-04-25: qty 2

## 2019-04-25 MED ORDER — HEPARIN SODIUM (PORCINE) 5000 UNIT/ML IJ SOLN
5000.0000 [IU] | Freq: Three times a day (TID) | INTRAMUSCULAR | Status: AC
  Administered 2019-04-25: 22:00:00 5000 [IU] via SUBCUTANEOUS
  Filled 2019-04-25: qty 1

## 2019-04-25 MED ORDER — SODIUM CHLORIDE 0.9 % IV SOLN
INTRAVENOUS | Status: AC
  Administered 2019-04-25: 10:00:00 via INTRAVENOUS

## 2019-04-25 MED ORDER — APIXABAN 2.5 MG PO TABS
2.5000 mg | ORAL_TABLET | Freq: Two times a day (BID) | ORAL | Status: DC
  Administered 2019-04-26: 2.5 mg via ORAL
  Filled 2019-04-25: qty 1

## 2019-04-25 MED ORDER — HEPARIN (PORCINE) IN NACL 1000-0.9 UT/500ML-% IV SOLN
INTRAVENOUS | Status: DC | PRN
  Administered 2019-04-25 (×2): 500 mL

## 2019-04-25 MED ORDER — LABETALOL HCL 5 MG/ML IV SOLN: 10.0000 mg | INTRAVENOUS | Status: AC | PRN

## 2019-04-25 MED ORDER — ASPIRIN 81 MG PO CHEW: 81.0000 mg | CHEWABLE_TABLET | ORAL | Status: DC

## 2019-04-25 MED ORDER — VERAPAMIL HCL 2.5 MG/ML IV SOLN
INTRAVENOUS | Status: AC
  Filled 2019-04-25: qty 2

## 2019-04-25 MED ORDER — LIDOCAINE HCL (PF) 1 % IJ SOLN
INTRAMUSCULAR | Status: DC | PRN
  Administered 2019-04-25 (×2): 2 mL

## 2019-04-25 MED ORDER — SODIUM CHLORIDE 0.9 % IV SOLN
INTRAVENOUS | Status: DC
  Administered 2019-04-25: 05:00:00 via INTRAVENOUS

## 2019-04-25 MED ORDER — SODIUM CHLORIDE 0.9 % IV SOLN: 250.0000 mL | INTRAVENOUS | Status: DC | PRN

## 2019-04-25 MED ORDER — VERAPAMIL HCL 2.5 MG/ML IV SOLN
INTRAVENOUS | Status: DC | PRN
  Administered 2019-04-25: 10 mL via INTRA_ARTERIAL

## 2019-04-25 MED ORDER — HYDRALAZINE HCL 20 MG/ML IJ SOLN: 10.0000 mg | INTRAMUSCULAR | Status: AC | PRN

## 2019-04-25 SURGICAL SUPPLY — 14 items
CATH BALLN WEDGE 5F 110CM (CATHETERS) ×1 IMPLANT
CATH INFINITI JR4 5F (CATHETERS) ×1 IMPLANT
CATH LAUNCHER 5F EBU3.5 (CATHETERS) ×1 IMPLANT
CATH OPTITORQUE TIG 4.0 5F (CATHETERS) ×1 IMPLANT
DEVICE RAD COMP TR BAND LRG (VASCULAR PRODUCTS) ×1 IMPLANT
GLIDESHEATH SLEND SS 6F .021 (SHEATH) ×1 IMPLANT
GUIDEWIRE INQWIRE 1.5J.035X260 (WIRE) IMPLANT
INQWIRE 1.5J .035X260CM (WIRE) ×2
KIT HEART LEFT (KITS) ×2 IMPLANT
PACK CARDIAC CATHETERIZATION (CUSTOM PROCEDURE TRAY) ×2 IMPLANT
SHEATH GLIDE SLENDER 4/5FR (SHEATH) ×1 IMPLANT
SHEATH PROBE COVER 6X72 (BAG) ×1 IMPLANT
TRANSDUCER W/STOPCOCK (MISCELLANEOUS) ×2 IMPLANT
TUBING CIL FLEX 10 FLL-RA (TUBING) ×2 IMPLANT

## 2019-04-25 NOTE — Progress Notes (Signed)
Progress Note  Patient Name: [name] Date of Encounter: 04/25/2019  Primary Cardiologist: Sherren Mocha, MD   Subjective   Patient breathing OK   NO CP     Inpatient Medications    Scheduled Meds: . atorvastatin  20 mg Oral Daily  . hydrALAZINE  50 mg Oral Q8H  . insulin aspart  0-15 Units Subcutaneous TID WC  . insulin aspart  0-5 Units Subcutaneous QHS  . metoprolol succinate  12.5 mg Oral Daily  . sodium chloride flush  3 mL Intravenous Once  . sodium chloride flush  3 mL Intravenous Q12H   Continuous Infusions: . sodium chloride    . sodium chloride 50 mL/hr at 04/25/19 0450   PRN Meds: sodium chloride, acetaminophen **OR** acetaminophen, ondansetron **OR** ondansetron (ZOFRAN) IV, sodium chloride flush   Vital Signs    Vitals:   04/24/19 1958 04/25/19 0042 04/25/19 0514 04/25/19 0744  BP: 136/82 (!) 162/94 (!) 140/93 139/86  Pulse: (!) 59 69 60 68  Resp: 18 18 18 20   Temp: 97.7 F (36.5 C) 97.8 F (36.6 C) 98.5 F (36.9 C) 97.9 F (36.6 C)  TempSrc: Oral Oral Oral Oral  SpO2: 95% 99% 94% 93%  Weight:   105.8 kg   Height:        Intake/Output Summary (Last 24 hours) at 04/25/2019 0801 Last data filed at 04/25/2019 0517 Gross per 24 hour  Intake 591.26 ml  Output 603 ml  Net -11.74 ml   Last 3 Weights 04/25/2019 04/24/2019 04/24/2019  Weight (lbs) 233 lb 3.2 oz 232 lb 8 oz (No Data)  Weight (kg) 105.779 kg 105.461 kg (No Data)      Telemetry    Atrial fib  - Personally Reviewed   Physical Exam   GEN: No acute distress.   Neck: JVP is not elevated Cardiac:  no murmurs Respiratory: Clear to auscultation bilaterally. GI: Soft, nontender, non-distended  MS: No edema; No deformity. Neuro:  Nonfocal  Psych: Normal affect   Labs    High Sensitivity Troponin:   Recent Labs  Lab 04/22/19 1142 04/22/19 1402 04/22/19 1759  TROPONINIHS 36* 43* 40*      Chemistry Recent Labs  Lab 04/22/19 1142 04/23/19 0349 04/24/19 0944  04/25/19 0437  NA 140 142 141 139  K 4.2 3.5 3.5 3.6  CL 107 104 100 100  CO2 22 25 28 28   GLUCOSE 138* 117* 225* 118*  BUN 20 21 24* 26*  CREATININE 1.18 1.30* 1.59* 1.46*  CALCIUM 8.8* 8.7* 8.7* 8.6*  PROT 7.1  --   --   --   ALBUMIN 3.8  --   --   --   AST 25  --   --   --   ALT 24  --   --   --   ALKPHOS 87  --   --   --   BILITOT 0.8  --   --   --   GFRNONAA 57* 51* 40* 44*  GFRAA >60 59* 46* 51*  ANIONGAP 11 13 13 11      Hematology Recent Labs  Lab 04/22/19 1142 04/23/19 0349 04/24/19 0944  WBC 6.9 6.7 6.4  RBC 4.27 4.27 4.50  HGB 12.8* 13.0 13.6  HCT 40.1 39.9 41.6  MCV 93.9 93.4 92.4  MCH 30.0 30.4 30.2  MCHC 31.9 32.6 32.7  RDW 13.1 13.1 13.1  PLT 199 221 210    BNP Recent Labs  Lab 04/22/19 1759 04/24/19 0944  BNP 671.8*  163.1*     DDimer No results for input(s): DDIMER in the last 168 hours.   Radiology    No results found.  Cardiac Studies  ECHO 10/19 Echocardiogram 04/23/2019: 1. Left ventricular ejection fraction, by visual estimation, is 35 to 40%. The left ventricle has moderately decreased function. Moderately increased left ventricular size. There is no left ventricular hypertrophy. 2. Diffuse hypokinesis worse in inferior wall. 3. Global right ventricle has normal systolic function.The right ventricular size is normal. No increase in right ventricular wall thickness. 4. Left atrial size was normal. 5. Right atrial size was normal. 6. The mitral valve is normal in structure. Mild to moderate mitral valve regurgitation. 7. Eccentric anteriorly directed and not well characterized by color flow. 8. The tricuspid valve is normal in structure. Tricuspid valve regurgitation moderate. 9. The aortic valve is tricuspid Aortic valve regurgitation is mild by color flow Doppler. Mild to moderate aortic valve sclerosis/calcification without any evidence of aortic stenosis. 10. The pulmonic valve was grossly normal. Pulmonic valve  regurgitation is mild by color flow Doppler. 11. Moderately elevated pulmonary artery systolic pressure.    Patient Profile     [name] is a 84 y.o. male with a PMH of chronic combined CHF, paroxysmal atrial fibrillation on Eliquis, HTN, HLD, pulmonary artery HTN, moderate MR, DM type 2, CKD stage 3, who is being seen today for the evaluation of acute on chronic combined CHF at the request of Dr. Benny Lennert.   Assessment & Plan    1  CHF  Acute systolic CHF    LVEF on echo is newly reduced.   LVEF 35 to 40%   He has diuresed significatnly    Has coronary calciium on CT of chest (extensive)   With drop in LVEF have set up for L heart cath  Which was done today   This showed results above   No critical leasions to explain LVEF   (Pt depressed about this) REocmm:   Follow on medical Rx    Will plan to reeval with echo in several months   2  PAF  Pt s/p TEE/DCCV 12/19  CHA2DS2 VASc is at least 5  Needs to resume ELiquis 2.5 bid  Can do in AM     3  HTN  BP markely right after cath   Resume meds Toprol and hydralazine and follow  No ACEI   Add Imdur    4  HL  On lipitor   5 CKD  WIll need to follow Cr after cath   Pt still finishing up with hydration orders    For questions or updates, please contact [facility] Please consult [email or web address] for contact info under        Signed, Dorris Carnes, MD  04/25/2019, 8:01 AM

## 2019-04-25 NOTE — Progress Notes (Signed)
Pt returned form cath lab tr band in place rt radial wrist. Level 0. Pt. Reoriented to room VSS. Educated pt on TR band protocol.

## 2019-04-25 NOTE — Interval H&P Note (Signed)
History and Physical Interval Note:  04/25/2019 8:48 AM  [name]  has presented today for surgery, with the diagnosis of heart failure.  The various methods of treatment have been discussed with the patient and family. After consideration of risks, benefits and other options for treatment, the patient has consented to  Procedure(s): RIGHT/LEFT HEART CATH AND CORONARY ANGIOGRAPHY (N/A)  PERCUTANEOUS CORONARY HYPERTENSION   as a surgical intervention.  The patient's history has been reviewed, patient examined, no change in status, stable for surgery.  I have reviewed the patient's chart and labs.  Questions were answered to the patient's satisfaction.     Glenetta Hew

## 2019-04-25 NOTE — Plan of Care (Signed)
°  Problem: Education: °Goal: Ability to demonstrate management of disease process will improve °Outcome: Progressing °Goal: Ability to verbalize understanding of medication therapies will improve °Outcome: Progressing °Goal: Individualized Educational Video(s) °Outcome: Progressing °  °

## 2019-04-25 NOTE — Progress Notes (Signed)
Physical Therapy Treatment & Discharge Patient Details Name: [name] MRN: [id number] DOB: 09-30-1934 Today's Date: 04/25/2019    History of Present Illness Patient is a 84 y/o male who presents with SOB, and weakness over the last 6 weeks. Admitted with acute on chronic CHF and accelerated HTN. PMH includes prostate ca, diastolic heart failure, HTN, DM, PAH, A. Fib-on Eliquis, CKD stage III, RBBB.    PT Comments    Patient progressing with mobility and demonstrating safe technique on stairs and sufficient community mobility with DGI 22/24.  Currently stable for home and no further skilled PT needs, though discussed walking plan if ok per heart MD.  PT signing off as goals achieved.    Follow Up Recommendations  Supervision - Intermittent;No PT follow up     Equipment Recommendations  None recommended by PT    Recommendations for Other Services       Precautions / Restrictions Precautions Precautions: Fall Precaution Comments: recent cath watch R wrist    Mobility  Bed Mobility Overal bed mobility: Modified Independent                Transfers Overall transfer level: Modified independent Equipment used: None Transfers: Sit to/from Stand Sit to Stand: Modified independent (Device/Increase time)            Ambulation/Gait Ambulation/Gait assistance: Independent Gait Distance (Feet): 300 Feet Assistive device: None Gait Pattern/deviations: Step-through pattern     General Gait Details: completed DGI, see balance section   Stairs Stairs: Yes Stairs assistance: Modified independent (Device/Increase time) Stair Management: Alternating pattern;Forwards Number of Stairs: 10 General stair comments: cues to pause and rest on landing prior to coming down   Wheelchair Mobility    Modified Rankin (Stroke Patients Only)       Balance Overall balance assessment: Modified Independent   Sitting balance-Leahy Scale: Good       Standing balance-Leahy  Scale: Good                   Standardized Balance Assessment Standardized Balance Assessment : Dynamic Gait Index   Dynamic Gait Index Level Surface: Normal Change in Gait Speed: Normal Gait with Horizontal Head Turns: Normal Gait with Vertical Head Turns: Mild Impairment Gait and Pivot Turn: Normal Step Over Obstacle: Normal Step Around Obstacles: Normal Steps: Mild Impairment Total Score: 22      Cognition Arousal/Alertness: Awake/alert Behavior During Therapy: WFL for tasks assessed/performed Overall Cognitive Status: Within Functional Limits for tasks assessed                                        Exercises      General Comments General comments (skin integrity, edema, etc.): Patient reports difficulty managing taking care of his B&B and discussed possibly having his daughter and family move in.  Also discussed "common sense" of stopping to rest when needed, using a bag to carry items up stairs to limit trips, etc.      Pertinent Vitals/Pain Pain Assessment: No/denies pain    Home Living                      Prior Function            PT Goals (current goals can now be found in the care plan section) Progress towards PT goals: Goals met/education completed, patient discharged from PT    Frequency  Min 3X/week      PT Plan Current plan remains appropriate    Co-evaluation              AM-PAC PT "6 Clicks" Mobility   Outcome Measure  Help needed turning from your back to your side while in a flat bed without using bedrails?: None Help needed moving from lying on your back to sitting on the side of a flat bed without using bedrails?: None Help needed moving to and from a bed to a chair (including a wheelchair)?: None Help needed standing up from a chair using your arms (e.g., wheelchair or bedside chair)?: None Help needed to walk in hospital room?: None Help needed climbing 3-5 steps with a railing? : None 6  Click Score: 24    End of Session   Activity Tolerance: Patient tolerated treatment well Patient left: in bed;with call bell/phone within reach   PT Visit Diagnosis: Muscle weakness (generalized) (M62.81);Difficulty in walking, not elsewhere classified (R26.2)     Time: 1540-1605 PT Time Calculation (min) (ACUTE ONLY): 25 min  Charges:  $Gait Training: 8-22 mins $Self Care/Home Management: [place] [phone] 04/25/2019    Reginia Naas 04/25/2019, 4:34 PM

## 2019-04-25 NOTE — Progress Notes (Signed)
PROGRESS NOTE    [name]  [id number] DOB: 21-Feb-1935 DOA: 04/22/2019 PCP: [name], MD    Brief Narrative:  [name] a 84 y.o.malewith medical history significant ofhypertension, diabetes, A. Fib-on Eliquis, CKD stage III, chronic systolic congestive heart failure, hyperlipidemia,RBBB,presents to emergency department due to worsening exertional shortness of breath, generalized weakness since 6 weeks. Patient reports shortness of breath associated withmild exertion,orthopnea, PND, leg swelling, intermittent productive cough with whitish sputum.  Denies chest pain, palpitation, headache, blurry vision, fever, chills, sore throat, runny nose, nausea, vomiting, epigastric pain, diarrhea, decreased appetite, urinary or bowel changes.I reviewed his home meds-he is currently not on any diuretics.  He lives alone and he rents out spare rooms in his house with air PND. Denies recent travel, COVID-19 exposure, smoking, alcohol, illicit drug use. Reports being compliant with his medications. Does not see any cardiologist outpatient.  ED Course:Upon arrival: His blood pressure was elevated. Troponin trended up from 36-43.EKG shows normal sinus rhythm, right bundle branch block, left axis deviation. Chest x-ray was obtained which came back negative for fluid overload. Patient received IV labetalol for his elevated blood pressure.  The patient was admitted to a telemetry bed. He has been diuresed, and his fluid balance is negative by 2166 cc this morning. Echocardiogram was obtained and demonstrated new findings of EF of 30-35% and diffuse global hypokinesis. Cardiology was consulted.  Patient taken to cath lab today (10/21) with no obstructive disease to explain the newly reduced EF.  Will continue to manage medically at this time.   Assessment & Plan:   Active Problems:   CKD (chronic kidney disease), stage III   HLD (hyperlipidemia)   Elevated troponin  Diabetes mellitus without complication (HCC)   HTN (hypertension)   Atrial fibrillation (HCC)   Acute on chronic combined systolic and diastolic CHF (congestive heart failure) (HCC)   Chronic systolic CHF (congestive heart failure) (HCC)   Weakness generalized   Acute systolic CHF (congestive heart failure) (HCC)   Peripheral edema   Acute on chronic combined systolic/diastolic CHF: The patient was admitted to a telemetry bed. He has been diuresed, and his fluid balance is net negative by 2166 cc since admission. Patient presented with worsening shortness of breath, orthopnea, PND and leg swelling, consistent with decompensated CHF. He is currently saturating in the low to mid nineties on room air. He continues to have severe lower extremity edema.  Chest x-ray was negative for fluid overload on admission.  Echo performed in March of [id number] had a systolic EF of [id number] and no evidence of diastolic dysfunction, and normal right systolic function.  Echo obtained on 10/19 showed reduced EF of 30-35% with global hypokinesis.  Lasix d/c'd 10/20 in prep for cath.  Patient taken for Right/Left heart cath today, 10/21 which showed no critical obstructive disease to explain the newly reduced EF.  Continuing medical management per cardiology. - strict I/O's, daily weights - sodium & fluid restriction - monitor renal function & electrolytes closely - cardiology following, appreciate recs  - resume Eliquis 2.5 mg BID tomorrow AM for pAfib  - resume Toprol and hydralazine  - add Imdur  - no ACEI   Accelerated hypertension: Blood pressure was elevated upon admission, but better controlled on lisinopril and hydralazine.  Lisinopril since HELD due to heart cath. Hydralazine were also given on the floor. Cardiology has been consulted.  Paroxysmal A. fib: Rate controlled  - monitor on telemetry - resume Eliquis 2.5 mg BID tomorrow AM - resumed Toprol  -  cardiology following  Elevated troponin: likely  demand ischemia, troponins have remained stable and patient without complaints of chest pain.  Diabetes mellitus: A1c 6.3% on 10/18. Glucose 103-156 over past 24 hours. - hold Metformin for now, resume on d/c - sliding scale insulin  Hyperlipidemia: Continue atorvastatin.  AKI on CKDIII: Baseline creatinine appears to be about 1, although it has frequently been between 1.2 and 1.4 in the past few months. Creatinine 1.46 this morning.  - Monitor creatinine, electrolytes, and volume status - Avoid nephrotoxic medications and hypotension.    DVT prophylaxis: TED/SCD's while Eliquis held Code Status:   Code Status: Full Code Family Communication: none at bedside Disposition Plan:  Expect home tomorrow, pending cardiology clearance   Consultants:   Cardiology  Procedures:   Right/Left Heart Cath 04/25/2019  Antimicrobials:   none    Subjective: Patient seen and examined, laying in bed status post heart cath.  Reports breathing is better, but expresses frustration and worry that no cause of his symptoms and reduced EF were identified on cath today.  Continues to complain of cough (lisinopril stopped this admission).  Says he may have some postnasal drip. Denies fever/chills, N/V/D, CP, SOB.    Objective: Vitals:   04/25/19 0930 04/25/19 0935 04/25/19 0940 04/25/19 0945  BP: (!) 165/92 (!) 166/101 (!) 167/99 (!) 165/105  Pulse: 66 66 66 67  Resp: 14 15 18 20   Temp:      TempSrc:      SpO2: 96% 97% 96% 98%  Weight:      Height:        Intake/Output Summary (Last 24 hours) at 04/25/2019 1344 Last data filed at 04/25/2019 0517 Gross per 24 hour  Intake 351.26 ml  Output 203 ml  Net 148.26 ml   Filed Weights   04/23/19 0555 04/24/19 0738 04/25/19 0514  Weight: 107.3 kg 105.5 kg 105.8 kg    Examination:  General exam: awake, alert, no acute distress, obese HEENT: atraumatic, clear conjunctiva, anicteric sclera, moist mucus membranes, hearing grossly normal   Respiratory system: clear to auscultation bilaterally, no wheezes, rales or rhonchi, normal respiratory effort. Cardiovascular system: normal S1/S2, RRR, no murmurs, rubs, gallops, pitting edema b/l LE's.   Gastrointestinal system: soft, non-tender, normal bowel sounds. Central nervous system: alert and oriented x4. no gross focal neurologic deficits, normal speech Extremities: moves all, normal tone Skin: dry, intact, normal temperature,  Psychiatry: depressed mood, congruent affect, judgement and insight appear normal   Data Reviewed: I have personally reviewed following labs and imaging studies  CBC: Recent Labs  Lab 04/22/19 1142 04/23/19 0349 04/24/19 0944 04/25/19 1041  WBC 6.9 6.7 6.4 7.2  NEUTROABS  --   --  4.5  --   HGB 12.8* 13.0 13.6 13.7  HCT 40.1 39.9 41.6 43.3  MCV 93.9 93.4 92.4 92.1  PLT 199 221 210 [id number]   Basic Metabolic Panel: Recent Labs  Lab 04/22/19 1142 04/22/19 1759 04/23/19 0349 04/24/19 0944 04/25/19 0437  NA 140  --  142 141 139  K 4.2  --  3.5 3.5 3.6  CL 107  --  104 100 100  CO2 22  --  25 28 28   GLUCOSE 138*  --  117* 225* 118*  BUN 20  --  21 24* 26*  CREATININE 1.18  --  1.30* 1.59* 1.46*  CALCIUM 8.8*  --  8.7* 8.7* 8.6*  MG  --  1.9  --   --   --   PHOS  --  3.6  --   --   --    GFR: Estimated Creatinine Clearance: 49.1 mL/min (A) (by C-G formula based on SCr of 1.46 mg/dL (H)). Liver Function Tests: Recent Labs  Lab 04/22/19 1142  AST 25  ALT 24  ALKPHOS 87  BILITOT 0.8  PROT 7.1  ALBUMIN 3.8   No results for input(s): LIPASE, AMYLASE in the last 168 hours. No results for input(s): AMMONIA in the last 168 hours. Coagulation Profile: No results for input(s): INR, PROTIME in the last 168 hours. Cardiac Enzymes: No results for input(s): CKTOTAL, CKMB, CKMBINDEX, TROPONINI in the last 168 hours. BNP (last 3 results) Recent Labs    08/10/18 1148  PROBNP 942*   HbA1C: Recent Labs    04/22/19 1759  HGBA1C 6.3*    CBG: Recent Labs  Lab 04/24/19 1138 04/24/19 1614 04/24/19 2108 04/25/19 0630 04/25/19 1200  GLUCAP 114* 103* 156* 131* 124*   Lipid Profile: Recent Labs    04/22/19 1759  CHOL 129  HDL 34*  LDLCALC 71  TRIG 118  CHOLHDL 3.8   Thyroid Function Tests: Recent Labs    04/22/19 1759  TSH 1.485   Anemia Panel: No results for input(s): VITAMINB12, FOLATE, FERRITIN, TIBC, IRON, RETICCTPCT in the last 72 hours. Sepsis Labs: Recent Labs  Lab 04/24/19 [place] <0.10    Recent Results (from the past 240 hour(s))  SARS CORONAVIRUS 2 (TAT 6-24 HRS) Nasopharyngeal Nasopharyngeal Swab     Status: None   Collection Time: 04/22/19  3:34 PM   Specimen: Nasopharyngeal Swab  Result Value Ref Range Status   SARS Coronavirus 2 NEGATIVE NEGATIVE Final    Comment: (NOTE) SARS-CoV-2 target nucleic acids are NOT DETECTED. The SARS-CoV-2 RNA is generally detectable in upper and lower respiratory specimens during the acute phase of infection. Negative results do not preclude SARS-CoV-2 infection, do not rule out co-infections with other pathogens, and should not be used as the sole basis for treatment or other patient management decisions. Negative results must be combined with clinical observations, patient history, and epidemiological information. The expected result is Negative. Fact Sheet for Patients: SugarRoll.be Fact Sheet for Healthcare Providers: https://www.woods-mathews.com/ This test is not yet approved or cleared by the [place] FDA and  has been authorized for detection and/or diagnosis of SARS-CoV-2 by FDA under an Emergency Use Authorization (EUA). This EUA will remain  in effect (meaning this test can be used) for the duration of the COVID-19 declaration under Section 56 4(b)(1) of the Act, 21 U.S.C. section 360bbb-3(b)(1), unless the authorization is terminated or revoked sooner. Performed at [facility], [place] [address] [place], [place] [address]          Radiology Studies: No results found.      Scheduled Meds: . atorvastatin  20 mg Oral Daily  . heparin  5,000 Units Subcutaneous Q8H  . hydrALAZINE  50 mg Oral Q8H  . insulin aspart  0-15 Units Subcutaneous TID WC  . insulin aspart  0-5 Units Subcutaneous QHS  . metoprolol succinate  12.5 mg Oral Daily  . sodium chloride flush  3 mL Intravenous Once  . sodium chloride flush  3 mL Intravenous Q12H  . sodium chloride flush  3 mL Intravenous Q12H   Continuous Infusions: . sodium chloride 75 mL/hr at 04/25/19 1014  . sodium chloride       LOS: 1 day    Time spent: 30-35 min    Ezekiel Slocumb, DO [facility] Pager: [phone]  If 7PM-7AM, please contact night-coverage [email or web address] Password [email or web address] 04/25/2019, 1:44 PM

## 2019-04-26 ENCOUNTER — Telehealth: Payer: Self-pay | Admitting: Physician Assistant

## 2019-04-26 DIAGNOSIS — I2721 Secondary pulmonary arterial hypertension: Secondary | ICD-10-CM

## 2019-04-26 LAB — BASIC METABOLIC PANEL
Anion gap: 9 (ref 5–15)
BUN: 21 mg/dL (ref 8–23)
CO2: 24 mmol/L (ref 22–32)
Calcium: 8.5 mg/dL — ABNORMAL LOW (ref 8.9–10.3)
Chloride: 106 mmol/L (ref 98–111)
Creatinine, Ser: 1.2 mg/dL (ref 0.61–1.24)
GFR calc Af Amer: >60 mL/min (ref >60)
GFR calc non Af Amer: 56 mL/min — ABNORMAL LOW (ref >60)
Glucose, Bld: 111 mg/dL — ABNORMAL HIGH (ref 70–99)
Potassium: 4.8 mmol/L (ref 3.5–5.1)
Sodium: 139 mmol/L (ref 135–145)

## 2019-04-26 LAB — CBC WITH DIFFERENTIAL/PLATELET
Abs Immature Granulocytes: 0.02 10*3/uL (ref 0.00–0.07)
Basophils Absolute: 0.1 10*3/uL (ref 0.0–0.1)
Basophils Relative: 1 %
Eosinophils Absolute: 0.2 10*3/uL (ref 0.0–0.5)
Eosinophils Relative: 3 %
HCT: 40.7 % (ref 39.0–52.0)
Hemoglobin: 13.1 g/dL (ref 13.0–17.0)
Immature Granulocytes: 0 %
Lymphocytes Relative: 32 %
Lymphs Abs: 2 10*3/uL (ref 0.7–4.0)
MCH: 29.8 pg (ref 26.0–34.0)
MCHC: 32.2 g/dL (ref 30.0–36.0)
MCV: 92.5 fL (ref 80.0–100.0)
Monocytes Absolute: 0.5 10*3/uL (ref 0.1–1.0)
Monocytes Relative: 8 %
Neutro Abs: 3.6 10*3/uL (ref 1.7–7.7)
Neutrophils Relative %: 56 %
Platelets: 216 10*3/uL (ref 150–400)
RBC: 4.4 MIL/uL (ref 4.22–5.81)
RDW: 13.2 % (ref 11.5–15.5)
WBC: 6.3 10*3/uL (ref 4.0–10.5)
nRBC: 0 % (ref 0.0–0.2)

## 2019-04-26 LAB — GLUCOSE, CAPILLARY
Glucose-Capillary: 119 mg/dL — ABNORMAL HIGH (ref 70–99)
Glucose-Capillary: 106 mg/dL — ABNORMAL HIGH (ref 70–99)

## 2019-04-26 LAB — MAGNESIUM: Magnesium: 2.2 mg/dL (ref 1.7–2.4)

## 2019-04-26 MED ORDER — LORATADINE 10 MG PO TABS: 10.0000 mg | ORAL_TABLET | Freq: Every day | ORAL | 1 refills | Status: DC

## 2019-04-26 MED ORDER — METOPROLOL SUCCINATE ER 25 MG PO TB24: 25.0000 mg | ORAL_TABLET | Freq: Every day | ORAL | 1 refills | Status: DC

## 2019-04-26 MED ORDER — FLUTICASONE PROPIONATE 50 MCG/ACT NA SUSP
2.0000 | Freq: Every day | NASAL | Status: DC
  Filled 2019-04-26: qty 16

## 2019-04-26 MED ORDER — METOPROLOL SUCCINATE ER 25 MG PO TB24: 25.0000 mg | ORAL_TABLET | Freq: Every day | ORAL | Status: DC

## 2019-04-26 MED ORDER — HYDRALAZINE HCL 50 MG PO TABS: 50.0000 mg | ORAL_TABLET | Freq: Three times a day (TID) | ORAL | 1 refills | Status: DC

## 2019-04-26 MED ORDER — LOSARTAN POTASSIUM 25 MG PO TABS: 25.0000 mg | ORAL_TABLET | Freq: Every day | ORAL | 1 refills | Status: DC

## 2019-04-26 MED ORDER — LORATADINE 10 MG PO TABS
10.0000 mg | ORAL_TABLET | Freq: Every day | ORAL | Status: DC
  Administered 2019-04-26: 11:00:00 10 mg via ORAL
  Filled 2019-04-26: qty 1

## 2019-04-26 MED ORDER — BENZONATATE 200 MG PO CAPS: 200.0000 mg | ORAL_CAPSULE | Freq: Three times a day (TID) | ORAL | 0 refills | Status: DC | PRN

## 2019-04-26 MED ORDER — APIXABAN 5 MG PO TABS: 5.0000 mg | ORAL_TABLET | Freq: Two times a day (BID) | ORAL | 1 refills | Status: DC

## 2019-04-26 MED ORDER — APIXABAN 5 MG PO TABS: 5.0000 mg | ORAL_TABLET | Freq: Two times a day (BID) | ORAL | Status: DC

## 2019-04-26 MED ORDER — LOSARTAN POTASSIUM 25 MG PO TABS
25.0000 mg | ORAL_TABLET | Freq: Every day | ORAL | Status: DC
  Administered 2019-04-26: 25 mg via ORAL
  Filled 2019-04-26: qty 1

## 2019-04-26 MED ORDER — BENZONATATE 100 MG PO CAPS: 200.0000 mg | ORAL_CAPSULE | Freq: Three times a day (TID) | ORAL | Status: DC | PRN

## 2019-04-26 NOTE — Progress Notes (Addendum)
Progress Note  Patient Name: [name] Date of Encounter: 04/26/2019  Primary Cardiologist: Sherren Mocha, MD  Subjective   Feeling totally back to normal. No CP, SOB, leg swelling.  He has a hard time hearing through the mask + shield so you have to speak slowly and clearly.   We went through his prior medication history. He is not sure why he is no longer on spironolactone but reports over the summer he saw his PCP insisting to "pare down" his medicine list to bare essentials because he was fearful of side effects and medications working against each other.   Inpatient Medications    Scheduled Meds: . apixaban  2.5 mg Oral BID  . atorvastatin  20 mg Oral Daily  . hydrALAZINE  50 mg Oral Q8H  . insulin aspart  0-15 Units Subcutaneous TID WC  . insulin aspart  0-5 Units Subcutaneous QHS  . metoprolol succinate  12.5 mg Oral Daily  . sodium chloride flush  3 mL Intravenous Once  . sodium chloride flush  3 mL Intravenous Q12H  . sodium chloride flush  3 mL Intravenous Q12H   Continuous Infusions: . sodium chloride     PRN Meds: sodium chloride, acetaminophen **OR** acetaminophen, ondansetron **OR** ondansetron (ZOFRAN) IV, sodium chloride flush   Vital Signs    Vitals:   04/26/19 0131 04/26/19 0513 04/26/19 0655 04/26/19 0743  BP: (!) 150/89 (!) 153/89  (!) 154/92  Pulse: 67 67  67  Resp: 20 20  18   Temp: 98.6 F (37 C) 98.4 F (36.9 C)  98 F (36.7 C)  TempSrc: Oral Oral  Oral  SpO2: 94% 95%  95%  Weight:   106.3 kg   Height:        Intake/Output Summary (Last 24 hours) at 04/26/2019 1000 Last data filed at 04/25/2019 2151 Gross per 24 hour  Intake 777.89 ml  Output 300 ml  Net 477.89 ml   Last 3 Weights 04/26/2019 04/25/2019 04/24/2019  Weight (lbs) 234 lb 6.4 oz 233 lb 3.2 oz 232 lb 8 oz  Weight (kg) 106.323 kg 105.779 kg 105.461 kg     Telemetry    SB/NSR no heart rates slower than 55 - Personally Reviewed  Physical Exam   GEN: No acute  distress.  HEENT: Normocephalic, atraumatic, sclera non-icteric. Neck: No JVD or bruits. Cardiac: RRR no murmurs, rubs, or gallops.  Radials/DP/PT 1+ and equal bilaterally.  Respiratory: Clear to auscultation bilaterally. Breathing is unlabored. GI: Soft, nontender, non-distended, BS +x 4. MS: no deformity. Extremities: No clubbing or cyanosis. No edema. Distal pedal pulses are 2+ and equal bilaterally. Right radial cath site without hematoma or ecchymosis; good pulse. Neuro:  AAOx3. Follows commands. Psych:  Responds to questions appropriately with a normal affect.  Labs    High Sensitivity Troponin:   Recent Labs  Lab 04/22/19 1142 04/22/19 1402 04/22/19 1759  TROPONINIHS 36* 43* 40*      Cardiac EnzymesNo results for input(s): TROPONINI in the last 168 hours. No results for input(s): TROPIPOC in the last 168 hours.   Chemistry Recent Labs  Lab 04/22/19 1142  04/24/19 0944 04/25/19 0437  04/25/19 0909 04/25/19 0913 04/26/19 0427  NA 140   < > 141 139   < > 142 141 139  K 4.2   < > 3.5 3.6   < > 3.4* 3.4* 4.8  CL 107   < > 100 100  --   --   --  106  CO2  22   < > 28 28  --   --   --  24  GLUCOSE 138*   < > 225* 118*  --   --   --  111*  BUN 20   < > 24* 26*  --   --   --  21  CREATININE 1.18   < > 1.59* 1.46*  --   --   --  1.20  CALCIUM 8.8*   < > 8.7* 8.6*  --   --   --  8.5*  PROT 7.1  --   --   --   --   --   --   --   ALBUMIN 3.8  --   --   --   --   --   --   --   AST 25  --   --   --   --   --   --   --   ALT 24  --   --   --   --   --   --   --   ALKPHOS 87  --   --   --   --   --   --   --   BILITOT 0.8  --   --   --   --   --   --   --   GFRNONAA 57*   < > 40* 44*  --   --   --  56*  GFRAA >60   < > 46* 51*  --   --   --  >60  ANIONGAP 11   < > 13 11  --   --   --  9   < > = values in this interval not displayed.     Hematology Recent Labs  Lab 04/24/19 0944  04/25/19 0913 04/25/19 1041 04/26/19 0427  WBC 6.4  --   --  7.2 6.3  RBC 4.50  --    --  4.70 4.40  HGB 13.6   < > 13.6 13.7 13.1  HCT 41.6   < > 40.0 43.3 40.7  MCV 92.4  --   --  92.1 92.5  MCH 30.2  --   --  29.1 29.8  MCHC 32.7  --   --  31.6 32.2  RDW 13.1  --   --  13.0 13.2  PLT 210  --   --  211 216   < > = values in this interval not displayed.    BNP Recent Labs  Lab 04/22/19 1759 04/24/19 0944  BNP 671.8* 163.1*     DDimer No results for input(s): DDIMER in the last 168 hours.   Radiology    No results found.  Cardiac Studies   Cardiac cath summary 04/25/19 SUMMARY  Diffuse mild to moderate CAD: Most notable 60% proximal OM1, 50 to 60% mid LAD at D2.  No obvious culprit lesion to explain significantly reduced EF.  Mild proximal LM and ostial LCx-calcified lesions.  Mild pulmonary hypertension with mean PA pressure of 28 mmHg.  LVEDP and PCWP from 12 to 15 mmHg.  Borderline CO-CI  RECOMMENDATIONS  Return to nursing unit for ongoing care, TR band removal.  Would continue aggressive risk factor modification and heart failure medications.    Appears to be at relatively well diuresed, would titrate up afterload reduction  If he has any concerning symptoms for possible angina, would consider noninvasive evaluation with Myoview, or perhaps coronary CTA  to determine presence of either anterior or lateral ischemia suggesting LAD or OM lesions are more significant than they appear angiographically.   Patient Profile     84 y.o. male with a PMH of chronic combined CHF, paroxysmal atrial fibrillation on Eliquis, HTN, HLD, pulmonary artery HTN,moderate MR,DM type 2, CKD stage 3 who was admitted with worsening heart failure. He has had tumultous year. He was admitted [id number] with diastolic CHF. He was admitted 06/2018 with new onset AF and drop in LV function (EF 20%) and underwent TEE/DCCV. Repeat echo 09/2018 showed normalization of EF. Admitted 10/2018 with vasovagal syncope. Readmitted this time with SOB, edema, weight gain, orthopnea, and cough.  2D echo showed recurrent LV dysfunction.  Assessment & Plan    1. Acute on chronic combined CHF with recurrent drop in LV function - cath as above without culprit. Suspect mixed CM. Mild pulmonary HTN noted, appeared to be well diuresed. Metoprolol dose was decreased this admission for bradycardia. Now on hydralazine. At last OP OV 11/2018 was on Lasix 40mg  daily, Lisinopril 20mg  daily and spironolactone 25mg  daily and was tolerating well with stable renal function. He thinks the spironolactone was discontinued over the summer when he asked his PCP to minimize his regimen. We had a long discussion about the diagnosis of CHF, long term impact on quality/quantity of life, and reason for the medications. I feel he would benefit from consideration of Entresto. Will discuss this plus plan for diuretic with Dr. Harrington Challenger. Reviewed 2g sodium restriction, 2L fluid restriction, daily weights with patient.  2. CAD as above - no obvious culprit to explain significantly reduced LVEF. Not on ASA due to concomitant Eliquis. Consider statin titration discussion as outpatient. Do not want to overwhelm him with too many changes as inpatient.  3. Paroxysmal atrial fibrillation, with baseline sinus bradycardia when in normal rhythm - in NSR this admission. Resume Eliquis. Question whether monitor should be considered as OP to exclude salvos of PAF contributing to LV dysfunction. Unclear why he has been on 2.5mg  dose of Eliquis chronically. OV in 11/2018 planned to titrate back to 5mg  BID. Will review with Dr. Harrington Challenger.  4. Accelerated HTN - anticipate following in context of HF med titration.  5. CKD stage III - creatinine appears at baseline.  6. Hyperlipidemia - LDL 71 this admission. Consider titration of atorvastatin to 40mg  daily as outpatient.  I went ahead and arranged TOC f/u 11/3 in our office and sent phone note to office for [facility] call after discharge.  For questions or updates, please contact [place] Please  consult [email or web address] for contact info under Cardiology/STEMI.  Signed, Charlie Pitter, PA-C 04/26/2019, 10:00 AM    Patient seen and examined  I agree with findings of D Dunn above    Cath without critical narrowings  LVEF down Plan for medical Rx   On exam, volume is not bad   BP is still up   Lungs are CTA Cardiac RRR   No S3 Legs with trivial edema  Would keep on hydralazine for now and add cozaar  (too close for Entresto)   Meds can be adjusted as outpt Increase toprol to 25 mg daily Hold on diuretic  (lasix or aldactone) for now Will need close f/u Discussed daily wts   Discussed low Na diet  Dorris Carnes MD 04/26/2019

## 2019-04-26 NOTE — Discharge Summary (Signed)
Physician Discharge Summary  [name] YTK:[id number] DOB: Jan 16, 1935 DOA: 04/22/2019  PCP: [name], MD  Admit date: 04/22/2019 Discharge date: 04/26/2019  Admitted From: Home Disposition:  Home  Recommendations for Outpatient Follow-up:  1. Follow up with PCP in 1-2 weeks 2. Please obtain BMP/CBC in one week 3. Please follow up with cardiology  Home Health: No Equipment/Devices: none  Discharge Condition: Stable CODE STATUS: Full Diet recommendation: Heart Healthy / Carb Modified / Fluid Restriction  Brief/Interim Summary:  [name] a 84 y.o.malewith medical history significant ofhypertension, diabetes, A. Fib-on Eliquis, CKD stage III, chronic systolic congestive heart failure, hyperlipidemia,RBBB,presents to emergency department due to worsening exertional shortness of breath, generalized weakness since 6 weeks. Patient reports shortness of breath associated withmild exertion,orthopnea, PND, leg swelling, intermittent productive cough with whitish sputum. Denies chest pain, palpitation, headache, blurry vision, fever, chills, sore throat, runny nose, nausea, vomiting, epigastric pain, diarrhea, decreased appetite, urinary or bowel changes.Was not on home diuretics on admission.  Reports being compliant with his medications. Does not see any cardiologist outpatient. ED Course:Upon arrival: His blood pressure was elevated. Troponin trended up from 36-43.EKG shows normal sinus rhythm, right bundle branch block, left axis deviation. Chest x-ray was obtained which came back negative for fluid overload. Patient received IV labetalol for his elevated blood pressure. He was admitted to a telemetry and cardiology consulted.  Diuresed well with fluid balance is negative by 2 L over course of admission, with improvement in his symptoms.  Echocardiogram was obtained and demonstrated new findings of EF of 30-35% (previously 50-55%) and diffuse global hypokinesis.  Patient taken to cath lab on 10/21 with no obstructive disease to explain the newly reduced EF.  Cardiology recommended medical management at this time.  Patients medications were optimized, including beta blocker, ARB, hydralazine.  Additional diuretics were held on discharge, per cardiology.  Close follow up recommended.  Patient advised to weigh daily, follow low sodium and fluid restricted diet.  He is clinically improved and hemodynamically stable for discharge home today, and expressed understanding and agreement with the plan.   Discharge Diagnoses: Active Problems:   CKD (chronic kidney disease), stage III   HLD (hyperlipidemia)   Elevated troponin   Diabetes mellitus without complication (HCC)   HTN (hypertension)   Atrial fibrillation (HCC)   Acute on chronic combined systolic and diastolic CHF (congestive heart failure) (HCC)   Chronic systolic CHF (congestive heart failure) (HCC)   Weakness generalized   Acute systolic CHF (congestive heart failure) ([place])   Peripheral edema    Discharge Instructions   Discharge Instructions    (HEART FAILURE PATIENTS) Call MD:  Anytime you have any of the following symptoms: 1) 3 pound weight gain in 24 hours or 5 pounds in 1 week 2) shortness of breath, with or without a dry hacking cough 3) swelling in the hands, feet or stomach 4) if you have to sleep on extra pillows at night in order to breathe.   Complete by: As directed    Call MD for:   Complete by: As directed    Worsening shortness of breath on exertion.   Diet - low sodium heart healthy   Complete by: As directed    Diet Carb Modified   Complete by: As directed    Discharge instructions   Complete by: As directed    Please follow a LOW SODIUM diet (maximum 2 g per day), and no more than 2 Liters fluid per day. Record your weight daily and bring  that to follow up appointments.   Heart Failure patients record your daily weight using the same scale at the same time of day    Complete by: As directed    Increase activity slowly   Complete by: As directed    STOP any activity that causes chest pain, shortness of breath, dizziness, sweating, or exessive weakness   Complete by: As directed      Allergies as of 04/26/2019      Reactions   Aspirin Other (See Comments)   History of "stomach ulcers," was told to not take this       Medication List    STOP taking these medications   lisinopril 20 MG tablet Commonly known as: ZESTRIL     TAKE these medications   apixaban 5 MG Tabs tablet Commonly known as: ELIQUIS Take 1 tablet (5 mg total) by mouth 2 (two) times daily. What changed: how much to take   atorvastatin 20 MG tablet Commonly known as: Lipitor Take 1 tablet (20 mg total) by mouth daily. What changed: when to take this   benzonatate 200 MG capsule Commonly known as: TESSALON Take 1 capsule (200 mg total) by mouth 3 (three) times daily as needed for cough.   cholecalciferol 25 MCG (1000 UT) tablet Commonly known as: VITAMIN D3 Take 1,000 Units by mouth every morning.   hydrALAZINE 50 MG tablet Commonly known as: APRESOLINE Take 1 tablet (50 mg total) by mouth every 8 (eight) hours.   loratadine 10 MG tablet Commonly known as: CLARITIN Take 1 tablet (10 mg total) by mouth daily. Start taking on: April 27, 2019   losartan 25 MG tablet Commonly known as: COZAAR Take 1 tablet (25 mg total) by mouth daily.   metFORMIN 500 MG tablet Commonly known as: Glucophage Take 1 tablet (500 mg total) by mouth 2 (two) times daily with a meal.   metoprolol succinate 25 MG 24 hr tablet Commonly known as: TOPROL-XL Take 1 tablet (25 mg total) by mouth daily. Start taking on: April 27, 2019 What changed:   medication strength  when to take this  additional instructions      Follow-up Information    [name], MD.   Specialty: Internal Medicine Why: [name] will call patient with appointment Contact information: [place] [address] [phone]        Daune Perch, NP Follow up.   Specialty: Cardiology Why: [facility] - see appointment information below with Pecolia Ades on 05/08/19. [name] is one of the nurse practitioners who works closely with Dr. Burt Knack. You have met before. Contact information: [address] [place] [address] [phone]          Allergies  Allergen Reactions  . Aspirin Other (See Comments)    History of "stomach ulcers," was told to not take this     Consultations:  Cardiology    Procedures/Studies: Dg Chest 2 View  Result Date: 04/22/2019 CLINICAL DATA:  84 year old male with history of shortness of breath. EXAM: CHEST - 2 VIEW COMPARISON:  Chest x-ray 10/30/2018. FINDINGS: Lung volumes are normal. No consolidative airspace disease. No pleural effusions. No pneumothorax. No pulmonary nodule or mass noted. Pulmonary vasculature is normal. Heart size is mildly enlarged. Upper mediastinal contours are within normal limits. IMPRESSION: 1. No radiographic evidence of acute cardiopulmonary disease. 2. Mild cardiomegaly. Electronically Signed   By: Vinnie Langton M.D.   On: 04/22/2019 13:28      Right/Left Heart Cath  04/25/2019 SUMMARY  Diffuse mild to moderate CAD: Most notable 60% proximal OM1, 50 to 60% mid LAD at D2. No obvious culprit lesion to explain significantly reduced EF. Mild proximal LM and ostial LCx-calcified lesions.  Mild pulmonary hypertension with mean PA pressure of 28 mmHg. LVEDP and PCWP from 12 to 15 mmHg.  Borderline CO-CI   Subjective: patient seen sitting up in chair.  Reports no difficulty breathing, no chest pain.  Denies fever, chills, N/V/D or other complaints.  States he needs to go home today "has things to do".   Discharge Exam: Vitals:   04/26/19 0743 04/26/19 1144  BP: (!) 154/92 (!) 152/91  Pulse: 67 (!) 52  Resp: 18 18  Temp: 98 F (36.7 C) 97.6 F (36.4 C)  SpO2: 95% 96%    Vitals:   04/26/19 0513 04/26/19 0655 04/26/19 0743 04/26/19 1144  BP: (!) 153/89  (!) 154/92 (!) 152/91  Pulse: 67  67 (!) 52  Resp: _0 Temp: 98.4 F (36.9 C)  98 F (36.7 C) 97.6 F (36.4 C)  TempSrc: Oral  Oral Oral  SpO2: 95%  95% 96%  Weight:  106.3 kg    Height:        General: Pt is alert, awake, not in acute distress Cardiovascular: RRR, S1/S2 +, no rubs, no gallops Respiratory: CTA bilaterally, no wheezing, no rhonchi Abdominal: Soft, NT, ND, bowel sounds + Extremities: no edema, no cyanosis    The results of significant diagnostics from this hospitalization (including imaging, microbiology, ancillary and laboratory) are listed below for reference.     Microbiology: Recent Results (from the past 240 hour(s))  SARS CORONAVIRUS 2 (TAT 6-24 HRS) Nasopharyngeal Nasopharyngeal Swab     Status: None   Collection Time: 04/22/19  3:34 PM   Specimen: Nasopharyngeal Swab  Result Value Ref Range Status   SARS Coronavirus 2 NEGATIVE NEGATIVE Final    Comment: (NOTE) SARS-CoV-2 target nucleic acids are NOT DETECTED. The SARS-CoV-2 RNA is generally detectable in upper and lower respiratory specimens during the acute phase of infection. Negative results do not preclude SARS-CoV-2 infection, do not rule out co-infections with other pathogens, and should not be used as the sole basis for treatment or other patient management decisions. Negative results must be combined with clinical observations, patient history, and epidemiological information. The expected result is Negative. Fact Sheet for Patients: SugarRoll.be Fact Sheet for Healthcare Providers: https://www.woods-mathews.com/ This test is not yet approved or cleared by the [place] FDA and  has been authorized for detection and/or diagnosis of SARS-CoV-2 by FDA under an Emergency Use Authorization (EUA). This EUA will remain  in effect (meaning this test can be  used) for the duration of the COVID-19 declaration under Section 56 4(b)(1) of the Act, 21 U.S.C. section 360bbb-3(b)(1), unless the authorization is terminated or revoked sooner. Performed at [facility], [place] [address], [place], [place] [address]      Labs: BNP (last 3 results) Recent Labs    10/31/18 0630 04/22/19 1759 04/24/19 0944  BNP 126.8* 671.8* 299.3*   Basic Metabolic Panel: Recent Labs  Lab 04/22/19 1142 04/22/19 1759 04/23/19 0349 04/24/19 0944 04/25/19 0437 04/25/19 0902 04/25/19 0909 04/25/19 0913 04/26/19 0427  NA 140  --  142 141 139 141 142 141 139  K 4.2  --  3.5 3.5 3.6 3.5 3.4* 3.4* 4.8  CL 107  --  104 100 100  --   --   --  106  CO2 22  --  _0 --   --   --  24  GLUCOSE 138*  --  117* 225* 118*  --   --   --  111*  BUN 20  --  21 24* 26*  --   --   --  21  CREATININE 1.18  --  1.30* 1.59* 1.46*  --   --   --  1.20  CALCIUM 8.8*  --  8.7* 8.7* 8.6*  --   --   --  8.5*  MG  --  1.9  --   --   --   --   --   --  2.2  PHOS  --  3.6  --   --   --   --   --   --   --    Liver Function Tests: Recent Labs  Lab 04/22/19 1142  AST 25  ALT 24  ALKPHOS 87  BILITOT 0.8  PROT 7.1  ALBUMIN 3.8   No results for input(s): LIPASE, AMYLASE in the last 168 hours. No results for input(s): AMMONIA in the last 168 hours. CBC: Recent Labs  Lab 04/22/19 1142 04/23/19 0349 04/24/19 0944 04/25/19 0902 04/25/19 0909 04/25/19 0913 04/25/19 1041 04/26/19 0427  WBC 6.9 6.7 6.4  --   --   --  7.2 6.3  NEUTROABS  --   --  4.5  --   --   --   --  3.6  HGB 12.8* 13.0 13.6 13.9 13.6 13.6 13.7 13.1  HCT 40.1 39.9 41.6 41.0 40.0 40.0 43.3 40.7  MCV 93.9 93.4 92.4  --   --   --  92.1 92.5  PLT 199 221 210  --   --   --  211 216   Cardiac Enzymes: No results for input(s): CKTOTAL, CKMB, CKMBINDEX, TROPONINI in the last 168 hours. BNP: Invalid input(s): POCBNP CBG: Recent Labs  Lab 04/25/19 1200 04/25/19 1628 04/25/19 2152 04/26/19 0615  04/26/19 1141  GLUCAP 124* 110* 110* 119* 106*   D-Dimer No results for input(s): DDIMER in the last 72 hours. Hgb A1c No results for input(s): HGBA1C in the last 72 hours. Lipid Profile No results for input(s): CHOL, HDL, LDLCALC, TRIG, CHOLHDL, LDLDIRECT in the last 72 hours. Thyroid function studies No results for input(s): TSH, T4TOTAL, T3FREE, THYROIDAB in the last 72 hours.  Invalid input(s): FREET3 Anemia work up No results for input(s): VITAMINB12, FOLATE, FERRITIN, TIBC, IRON, RETICCTPCT in the last 72 hours. Urinalysis    Component Value Date/Time   COLORURINE YELLOW 04/22/2019 1258   APPEARANCEUR CLEAR 04/22/2019 1258   LABSPEC 1.012 04/22/2019 1258   PHURINE 5.0 04/22/2019 1258   GLUCOSEU NEGATIVE 04/22/2019 [place] 04/22/2019 [place] 04/22/2019 [place] 04/22/2019 1258   PROTEINUR NEGATIVE 04/22/2019 1258   UROBILINOGEN 0.2 11/25/2008 0201   NITRITE NEGATIVE 04/22/2019 1258   LEUKOCYTESUR NEGATIVE 04/22/2019 1258   Sepsis Labs Invalid input(s): PROCALCITONIN,  WBC,  LACTICIDVEN Microbiology Recent Results (from the past 240 hour(s))  SARS CORONAVIRUS 2 (TAT 6-24 HRS) Nasopharyngeal Nasopharyngeal Swab     Status: None   Collection Time: 04/22/19  3:34 PM   Specimen: Nasopharyngeal Swab  Result Value Ref Range Status   SARS Coronavirus 2 NEGATIVE NEGATIVE Final    Comment: (NOTE) SARS-CoV-2 target nucleic acids are NOT DETECTED. The SARS-CoV-2 RNA is generally detectable in upper and lower respiratory specimens during the  acute phase of infection. Negative results do not preclude SARS-CoV-2 infection, do not rule out co-infections with other pathogens, and should not be used as the sole basis for treatment or other patient management decisions. Negative results must be combined with clinical observations, patient history, and epidemiological information. The expected result is Negative. Fact Sheet for  Patients: SugarRoll.be Fact Sheet for Healthcare Providers: https://www.woods-mathews.com/ This test is not yet approved or cleared by the [place] FDA and  has been authorized for detection and/or diagnosis of SARS-CoV-2 by FDA under an Emergency Use Authorization (EUA). This EUA will remain  in effect (meaning this test can be used) for the duration of the COVID-19 declaration under Section 56 4(b)(1) of the Act, 21 U.S.C. section 360bbb-3(b)(1), unless the authorization is terminated or revoked sooner. Performed at [facility], [place] [address], [place], [place] [address]      Time coordinating discharge: Over 30 minutes  SIGNED:   Ezekiel Slocumb, DO [facility] 04/26/2019, 9:17 PM Pager [phone]  If 7PM-7AM, please contact night-coverage [email or web address] Password [email or web address]

## 2019-04-26 NOTE — Telephone Encounter (Signed)
Pt is likely being dc today or tomorrow. I arranged TOC f/u with [name] on 11/3. He will need TOC call. Thanks. Dayna Dunn PA-C

## 2019-04-27 NOTE — Telephone Encounter (Signed)
Patient contacted regarding discharge from [facility] on10/22/2020.  Patient understands to follow up with provider Daune Perch, NP on 05/08/2019 at 2 pm at [place] in [place],  [address]. Patient understands discharge instructions? Yes Patient understands medications and regiment? Yes  The pt was prescribed Eliquis 5 mg BID for A-Fib while in hosp this stay. He reports that he took Eliquis 5 mg in the past but Dr [name] had to lower the dose to 2.5 mg BID due to reoccurring bothersome  nose bleeds and that his nose bleeds stopped. He states that he is taking 1/2 of his Eliquis 5 mg tablet (2.5 mg) BID at this time as he stressed that he does not want any more nose bleeds.  I have advised him that we will s/w our DOD to see if it is ok for him to take his Eliquis at half the dose prescribed while in hosp. And will call him back with recommendation(s).    Patient understands to bring all medications to this visit? Yes

## 2019-04-27 NOTE — Telephone Encounter (Signed)
Patient should probably be tried on another DOAC like Xarelto to see if nose bleeds are better on this. He is at high risk of clot on a lower dose of Elqiuis. If he insists on staying on Eliquis 2.5 he would need new Rx as its not ideal to cut tablets and he would need to understand that he is putting himself at higher risk of clot. I will send to both DOD and Dr. Burt Knack for input

## 2019-04-28 NOTE — Telephone Encounter (Signed)
Agree with Melissa. Would try Xarelto first to see if this is better tolerated. If he has recurrent nose bleeds, then dropping to the lower dose of Eliquis is a reasonable second option. As she points out, it does fall out of the recommended dosing range for this patient. Other question is whether he has seen ENT?

## 2019-04-30 NOTE — Telephone Encounter (Signed)
Left message on patient voicemail to discuss below.  Would patient be willing to try another blood thinner such as xarelto 20mg  daily Has he been following recommendations by ENT- humidifier, Pronaris nasal emollient If he is unwilling to change, will need patient to verbalize that he understands he is at higher risk of stroke on lower dose.

## 2019-05-01 MED ORDER — RIVAROXABAN 20 MG PO TABS: 20.0000 mg | ORAL_TABLET | Freq: Every day | ORAL | 5 refills | Status: DC

## 2019-05-01 NOTE — Telephone Encounter (Signed)
Patient agreeable to switch to Xarelto 20mg  daily. Advised to try using humidifier and Pronaris nasal emollient as recommended by ENT. Advised patient to start xarelto when his next dose of Eliquis is due. Xarelto should be taken once a day (different that Eliquis) with the largest meal of the day (usually dinner). Rx for Xarelto 20mg  daily sent to pharmacy.

## 2019-05-04 ENCOUNTER — Telehealth: Payer: Self-pay | Admitting: Cardiovascular Disease

## 2019-05-04 NOTE — Telephone Encounter (Signed)
New message   Patient states that he is going to stop taking xarelto and go back on apixaban (ELIQUIS) 5 MG TABS tablet due to having nose bleeds. Please call to discuss.

## 2019-05-04 NOTE — Telephone Encounter (Signed)
I spoke to the patient who was recently switched from Eliquis 2.5 mg bid (per patient) to Xarelto 20 mg Daily.  He started having nose bleeds yesterday 10/29 and wants to stop the Xarelto and get back on the Eliquis 2.5 mg bid.  Please advise, thank you.

## 2019-05-06 NOTE — Telephone Encounter (Signed)
This is fine. Doesn't sound like he is going to be able to tolerate full-dose anticoagulation. thanks

## 2019-05-07 NOTE — Telephone Encounter (Signed)
Left message to call back  

## 2019-05-08 ENCOUNTER — Other Ambulatory Visit: Payer: Self-pay

## 2019-05-08 ENCOUNTER — Ambulatory Visit: Payer: Medicare Other | Admitting: Cardiology

## 2019-05-08 ENCOUNTER — Encounter: Payer: Self-pay | Admitting: Cardiology

## 2019-05-08 VITALS — BP 138/70 | HR 63 | Ht 72.0 in | Wt 249.0 lb

## 2019-05-08 DIAGNOSIS — E785 Hyperlipidemia, unspecified: Secondary | ICD-10-CM

## 2019-05-08 DIAGNOSIS — N1831 Chronic kidney disease, stage 3a: Secondary | ICD-10-CM

## 2019-05-08 DIAGNOSIS — I480 Paroxysmal atrial fibrillation: Secondary | ICD-10-CM

## 2019-05-08 DIAGNOSIS — I5043 Acute on chronic combined systolic (congestive) and diastolic (congestive) heart failure: Secondary | ICD-10-CM

## 2019-05-08 DIAGNOSIS — I10 Essential (primary) hypertension: Secondary | ICD-10-CM | POA: Diagnosis not present

## 2019-05-08 DIAGNOSIS — I2510 Atherosclerotic heart disease of native coronary artery without angina pectoris: Secondary | ICD-10-CM

## 2019-05-08 MED ORDER — ENTRESTO 49-51 MG PO TABS: 1.0000 | ORAL_TABLET | Freq: Two times a day (BID) | ORAL | 0 refills | Status: DC

## 2019-05-08 NOTE — Patient Instructions (Addendum)
Medication Instructions:  1.) STOP: Losartan   2.) START: Entresto 49-51 MG twice a day (Do not start until tomorrow)  *If you need a refill on your cardiac medications before your next appointment, please call your pharmacy*  Lab Work: FUTURE: BMET when you come in to see the pharmacist   If you have labs (blood work) drawn today and your tests are completely normal, you will receive your results only by: Marland Kitchen MyChart Message (if you have MyChart) OR . A paper copy in the mail If you have any lab test that is abnormal or we need to change your treatment, we will call you to review the results.  Testing/Procedures: None   Follow-Up: You are scheduled to see the pharmacist on 05/22/2019 @ 1:30 PM  You are scheduled to see Dr. Burt Knack on 08/10/2019 @ 9:20 AM  Other Instructions  Do the following things EVERY DAY:   1. Weigh yourself EVERY morning after you go to the bathroom but before you eat or drink anything. Write this number down in a weight log/diary. If you gain 3 pounds overnight or 5 pounds in a week, call the office.   2. Take your medicines as prescribed. If you have concerns about your medications, please call us before you stop taking them.    3. Eat low salt foods-Limit salt (sodium) to 2000 mg per day. This will help prevent your body from holding onto fluid. Read food labels as many processed foods have a lot of sodium, especially canned goods and prepackaged meats. If you would like some assistance choosing low sodium foods, we would be happy to set you up with a nutritionist.   4. Stay as active as you can everyday. Staying active will give you more energy and make your muscles stronger. Start with 5 minutes at a time and work your way up to 30 minutes a day. Break up your activities--do some in the morning and some in the afternoon. Start with 3 days per week and work your way up to 5 days as you can.  If you have chest pain, feel short of breath, dizzy, or lightheaded,  STOP. If you don't feel better after a short rest, call 911. If you do feel better, call the office to let us know you have symptoms with exercise.   5. Limit all fluids for the day to less than 2 liters. Fluid includes all drinks, coffee, juice, ice chips, soup, jello, and all other liquids.

## 2019-05-08 NOTE — Progress Notes (Addendum)
Cardiology Office Note:    Date:  05/08/2019   ID:  [name], DOB May 02, 1935, MRN [id number]  PCP:  [name], MD  Cardiologist:  Sherren Mocha, MD  Referring MD: [name], MD   Chief Complaint  Patient presents with  . Hospitalization Follow-up  . Congestive Heart Failure    History of Present Illness:    [name] is a 84 y.o. male with a past medical history significant for chronic combined CHF, paroxysmal atrial fibrillation on Eliquis, HTN, HLD, pulmonary artery HTN,moderate MR,DM type 2, CKD stage 3 who was admitted with worsening heart failure. He has had tumultous year. He was admitted [id number] with diastolic CHF. He was admitted 06/2018 with new onset AF and drop in LV function (EF 20%) and underwent TEE/DCCV. Repeat echo 09/2018 showed normalization of EF. Admitted 10/2018 with vasovagal syncope. Readmitted 04/2019 with SOB, edema, weight gain, orthopnea, and cough. 2D echo showed recurrent LV dysfunction.  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  Metoprolol dose was decreased during recent admission due to bradycardia.  He is now on hydralazine.  He continues on Lasix 40 mg daily, losartan 25 mg daily and spironolactone 25 mg daily.  He had stable renal function during recent hospitalization.  Patient has a history of paroxysmal atrial fibrillation with baseline sinus bradycardia when in normal rhythm.  He was on Eliquis for stroke risk reduction at a reduced dose due to history of bothersome nosebleeds.  His Eliquis was increased to the recommended dosage at the hospital.  Recently the patient was concerned about the return of nosebleeds and he was switched to Xarelto to see if he would tolerate this better.  The patient again developed nosebleeds for the past 3 days.  He is now back to Eliquis 2.5 mg BID per Dr. [name].  Pt is here today for hospital  follow after cath. His right wrist cath site is healing well. He denies any chest pain. He has mild DOE with climbing up 4 flights of stairs in his house.  He went to bed and breakfast with 5 Gastrinex.  He denies orthopnea, PND, Edema. He says that his ankle edema has resolved. He had had a productive cough which he says has also resolved.   He feels like his quality of life is going down. He has a lack of energy. He has also had no sexual functioning since his prostate cancer years ago. He is happy that he still drives. He would like to be able to walk better. He feels like he has leg weakness.   Cardiac studies   Cardiac cath summary 04/25/19 SUMMARY  Diffuse mild to moderate CAD: Most notable 60% proximal OM1, 50 to 60% mid LAD at D2. No obvious culprit lesion to explain significantly reduced EF. Mild proximal LM and ostial LCx-calcified lesions.  Mild pulmonary hypertension with mean PA pressure of 28 mmHg. LVEDP and PCWP from 12 to 15 mmHg.  Borderline CO-CI  RECOMMENDATIONS  Return to nursing unit for ongoing care, TR band removal.  Would continue aggressive risk factor modification and heart failure medications.   Appears to be at relatively well diuresed, would titrate up afterload reduction  If he has any concerning symptoms for possible angina, would consider noninvasive evaluation with Myoview, or perhaps coronary CTA to determine presence of either anterior or lateral ischemia suggesting LAD or OM lesions are more significant than they appear angiographically.   Past  Medical History:  Diagnosis Date  . A-fib ([place])    RVR-DCCV  . Abnormal PSA 01/17/2012   10.38/4/5/ PSA:2013=6.96/ PSA 07/08/2011=6.26/ PSA 2006=8.6  . 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 Blood transfusion without reported diagnosis    during vagotomy 1976  . Cancer [facility]) 2006   L-2 solitary metastatic lesion/no rad tx ,asymptomatic  . Chronic kidney disease    nephrolithiasis  right kidney  . ED (erectile dysfunction)   . GERD (gastroesophageal reflux disease)    PUD S/P PARTIAL VAGOTOMY  . Gynecomastia 04/21/11   seen by Dr.Murray, no rad tx  . H/O impacted cerumen    S/P ENT DR BATES  . History of echocardiogram    echo 4/16:  mild LVH, EF 55-60%, Gr 1 DD, no RWMA, mild AI, mild MR, mod LAE, mild TR  . Hx of cardiovascular stress test    Myoview 5/16:  EF 50%, no scar or ischemia, low risk  . Hydroureteronephrosis    right   . Hyperglycemia    WITHOUT DM2  . Hyperlipidemia   . Hypertension   . Insomnia   . Migraines    WITH AURA  . Nephrolithiasis   . Osteopenia   . Overweight   . Prostate cancer ([name]) 12/09/03 dx   Prostate ca/adenocarcinoma,gleason=3+3=6    pSA 4.6  . Prostate cancer ([name])    METASTATIC WITH L2 SCLEROTIC LESION ON LUPRON------DR. WOODRUFF  . RBBB (right bundle branch block)   . Rosacea   . Sinus bradycardia   . Sleep apnea    pt unaware  . Stroke [facility])    asymptomatic, found on head imaging  . Ulcer    peptic  . Use of leuprolide acetate (Lupron) 06/2005-11/2006   02/2008 degarelix 02/2008 x 3 doses    Past Surgical History:  Procedure Laterality Date  . CARDIOVERSION N/A 06/19/2018   Procedure: CARDIOVERSION;  Surgeon: Larey Dresser, MD;  Location: [facility] ENDOSCOPY;  Service: Cardiovascular;  Laterality: N/A;  . PROSTATE BIOPSY  12/09/03   adenocarcinoma,glerason: 3+3=6  . RIGHT/LEFT HEART CATH AND CORONARY ANGIOGRAPHY N/A 04/25/2019   Procedure: RIGHT/LEFT HEART CATH AND CORONARY ANGIOGRAPHY;  Surgeon: Leonie Man, MD;  Location: [facility] CV LAB;  Service: Cardiovascular;  Laterality: N/A;  . TEE WITHOUT CARDIOVERSION N/A 06/19/2018   Procedure: TRANSESOPHAGEAL ECHOCARDIOGRAM (TEE);  Surgeon: Larey Dresser, MD;  Location: [facility] ENDOSCOPY;  Service: Cardiovascular;  Laterality: N/A;  . VAGOTOMY  1978   partial    Current Medications: Current Meds  Medication Sig  . apixaban (ELIQUIS) 2.5 MG TABS tablet Take  2.5 mg by mouth 2 (two) times daily.  Marland Kitchen atorvastatin (LIPITOR) 20 MG tablet Take 1 tablet (20 mg total) by mouth daily. (Patient taking differently: Take 20 mg by mouth every morning. )  . benzonatate (TESSALON) 200 MG capsule Take 1 capsule (200 mg total) by mouth 3 (three) times daily as needed for cough.  . cholecalciferol (VITAMIN D3) 25 MCG (1000 UT) tablet Take 1,000 Units by mouth every morning.  . furosemide (LASIX) 20 MG tablet Take 20 mg by mouth daily.  . hydrALAZINE (APRESOLINE) 50 MG tablet Take 1 tablet (50 mg total) by mouth every 8 (eight) hours.  Marland Kitchen loratadine (CLARITIN) 10 MG tablet Take 1 tablet (10 mg total) by mouth daily.  . metoprolol succinate (TOPROL-XL) 25 MG 24 hr tablet Take 1 tablet (25 mg total) by mouth daily.  . [DISCONTINUED] apixaban (ELIQUIS) 5 MG TABS  tablet Take 1 tablet (5 mg total) by mouth 2 (two) times daily.  . [DISCONTINUED] losartan (COZAAR) 25 MG tablet Take 1 tablet (25 mg total) by mouth daily.  . [DISCONTINUED] rivaroxaban (XARELTO) 20 MG TABS tablet Take 1 tablet (20 mg total) by mouth daily with supper.     Allergies:   Aspirin   Social History   Socioeconomic History  . Marital status: Widowed    Spouse name: Not on file  . Number of children: 2  . Years of education: Not on file  . Highest education level: Not on file  Occupational History  . 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 Financial resource strain: Not on file  . Food insecurity    Worry: Not on file    Inability: Not on file  . Transportation needs    Medical: Not on file    Non-medical: Not on file  Tobacco Use  . Smoking status: Former Smoker    Types: Pipe    Quit date: 07/05/1984    Years since quitting: 34.8  . Smokeless tobacco: Never Used  Substance and Sexual Activity  . Alcohol use: No  . Drug use: No  . Sexual activity: Never  Lifestyle  . Physical activity    Days per week: Not on file    Minutes per session: Not on  file  . Stress: Not on file  Relationships  . Social Herbalist on phone: Not on file    Gets together: Not on file    Attends religious service: Not on file    Active member of club or organization: Not on file    Attends meetings of clubs or organizations: Not on file    Relationship status: Not on file  Other Topics Concern  . Not on file  Social History Narrative   Pt lives in [place], alone.  Widower.   Retired Animator.     Family History: The patient's family history includes Kidney Stones in his father; Liver cancer in his father. There is no history of Colon cancer or Stroke. ROS:   Please see the history of present illness.     All other systems reviewed and are negative.   EKG:  EKG is not ordered today.    Recent Labs: 08/10/2018: NT-Pro BNP 942 04/22/2019: ALT 24; TSH 1.485 04/24/2019: B Natriuretic Peptide 163.1 04/26/2019: BUN 21; Creatinine, Ser 1.20; Hemoglobin 13.1; Magnesium 2.2; Platelets 216; Potassium 4.8; Sodium 139   Recent Lipid Panel    Component Value Date/Time   CHOL 129 04/22/2019 1759   TRIG 118 04/22/2019 1759   HDL 34 (L) 04/22/2019 1759   CHOLHDL 3.8 04/22/2019 1759   VLDL 24 04/22/2019 1759   LDLCALC 71 04/22/2019 1759    Physical Exam:    VS:  BP 138/70   Pulse 63   Ht 6' (1.829 m)   Wt 249 lb (112.9 kg)   SpO2 93%   BMI 33.77 kg/m     Wt Readings from Last 6 Encounters:  05/08/19 249 lb (112.9 kg)  04/26/19 234 lb 6.4 oz (106.3 kg)  11/08/18 238 lb (108 kg)  10/31/18 242 lb 15.2 oz (110.2 kg)  08/10/18 241 lb 12.8 oz (109.7 kg)  08/06/18 240 lb (108.9 kg)     Physical Exam  Constitutional: He is oriented to person, place, and time. He appears well-developed and well-nourished. No distress.  HENT:  Head: Normocephalic and  atraumatic.  Neck: Normal range of motion. Neck supple. No JVD present.  Cardiovascular: Normal rate, regular rhythm and intact distal pulses. Exam reveals no gallop and no  friction rub.  Murmur heard. High-pitched blowing holosystolic murmur is present with a grade of 2/6 at the upper left sternal border and apex. Pulmonary/Chest: Breath sounds normal. No respiratory distress. He has no wheezes. He has no rales.  Abdominal: Soft.  Musculoskeletal: Normal range of motion.        General: No edema.  Neurological: He is alert and oriented to person, place, and time.  Skin: Skin is warm and dry.  Psychiatric: He has a normal mood and affect. His behavior is normal. Judgment and thought content normal.  Vitals reviewed.   ASSESSMENT:    1. Acute on chronic combined systolic and diastolic CHF (congestive heart failure) ([place])   2. Coronary artery disease involving native coronary artery of native heart without angina pectoris   3. Paroxysmal atrial fibrillation (HCC)   4. Essential (primary) hypertension   5. Stage 3a chronic kidney disease   6. Hyperlipidemia, unspecified hyperlipidemia type    PLAN:    In order of problems listed above:  1. Acute on chronic combined CHF  -Patient with history of decreased EF but subsequently improved. Recently hospitalized 04/25/2019 and found to have recurrent drop in LV function - cath as above without culprit. Suspect mixed CM. Mild pulmonary HTN noted, appeared to be well diuresed. -Metoprolol dose was decreased this admission for bradycardia. Now on hydralazine.  Patient had previously been on spironolactone but was discontinued at some point for unclear reason, possibly to pare down his medication list. -Patient was started on losartan 25 mg daily in place of ACE inhibitor.  -Volume status is stable today.  He is getting back to activity with no significant exertional dyspnea or chest discomfort.  With his not feeling quite back to normal and several recent hospitalizations for heart failure, I feel that he would benefit from [place].  He says that his home blood pressures have been elevated up to the 180s.  We will  discontinue his losartan and start the mid dose of Entresto 49/51 mg twice daily.  Patient given instructions not to start Entresto until 24 hours off of losartan (would be tomorrow).  He will continue to monitor home blood pressures. -I will have the patient follow-up with pharmacy in 2 weeks to evaluate his response to [facility] and a basic metabolic panel. -With the significant diuretic effects of Entresto, the patient may need decrease or elimination of his Lasix at follow-up in 2 weeks.  Patient advised to watch for dehydration, lightheadedness, low blood pressure. -Continue 2g sodium restriction, 2L fluid restriction, daily weights with patient.  2. CAD as above  -no obvious culprit to explain significantly reduced LVEF.  Not on ASA due to concomitant anticoagulation.  -No chest discomfort.   3. Paroxysmal atrial fibrillation -with baseline sinus bradycardia when in normal rhythm - in NSR during recent admission.  Heart rate is regular today. -Patient was previously on reduced dose of Eliquis for stroke risk reduction due to bothersome nosebleeds.  He tried Xarelto but his nosebleeds reoccurred so he was placed back on Eliquis 2.5 mg twice daily by Dr. [name].  4. Accelerated HTN  -BP is currently well controlled today but patient reports elevated blood pressures at home with systolic blood pressure up to the 180s.  As above we will switch losartan to the mid dose of Entresto.  If he  tolerates Entresto and has further elevated blood pressures could consider resuming spironolactone.  5. CKD stage III  -creatinine at baseline during recent hospitalization. -We will recheck metabolic panel 2 weeks after initiation of Entresto.  6. Hyperlipidemia  -LDL 71 on 04/22/2019.  -Historically LDL has been around 70.  Will continue current therapy with atorvastatin 20 mg daily   Medication Adjustments/Labs and Tests Ordered: Current medicines are reviewed at length with the patient today.   Concerns regarding medicines are outlined above. Labs and tests ordered and medication changes are outlined in the patient instructions below:  Patient Instructions  Medication Instructions:  1.) STOP: Losartan   2.) START: Entresto 49-51 MG twice a day (Do not start until tomorrow)  *If you need a refill on your cardiac medications before your next appointment, please call your pharmacy*  Lab Work: FUTURE: BMET when you come in to see the pharmacist   If you have labs (blood work) drawn today and your tests are completely normal, you will receive your results only by: Marland Kitchen MyChart Message (if you have MyChart) OR . A paper copy in the mail If you have any lab test that is abnormal or we need to change your treatment, we will call you to review the results.  Testing/Procedures: None   Follow-Up: You are scheduled to see the pharmacist on 05/22/2019 @ 1:30 PM  You are scheduled to see Dr. Burt Knack on 08/10/2019 @ 9:20 AM  Other Instructions  Do the following things EVERY DAY:   1. Weigh yourself EVERY morning after you go to the bathroom but before you eat or drink anything. Write this number down in a weight log/diary. If you gain 3 pounds overnight or 5 pounds in a week, call the office.   2. Take your medicines as prescribed. If you have concerns about your medications, please call us before you stop taking them.    3. Eat low salt foods-Limit salt (sodium) to 2000 mg per day. This will help prevent your body from holding onto fluid. Read food labels as many processed foods have a lot of sodium, especially canned goods and prepackaged meats. If you would like some assistance choosing low sodium foods, we would be happy to set you up with a nutritionist.   4. Stay as active as you can everyday. Staying active will give you more energy and make your muscles stronger. Start with 5 minutes at a time and work your way up to 30 minutes a day. Break up your activities--do some in the morning  and some in the afternoon. Start with 3 days per week and work your way up to 5 days as you can.  If you have chest pain, feel short of breath, dizzy, or lightheaded, STOP. If you don't feel better after a short rest, call 911. If you do feel better, call the office to let us know you have symptoms with exercise.   5. Limit all fluids for the day to less than 2 liters. Fluid includes all drinks, coffee, juice, ice chips, soup, jello, and all other liquids.       Signed, Daune Perch, NP  05/08/2019 5:54 PM    [place]

## 2019-05-09 NOTE — Telephone Encounter (Signed)
See 11/3 OV with Pecolia Ades.

## 2019-05-19 NOTE — Progress Notes (Unsigned)
Patient ID: [name]                 DOB: 21-May-1935                      MRN: [id number]     HPI: [name] is a 84 y.o. male patient of Dr. Burt Knack referred by Daune Perch for management of Entresto dosing management. Patient was recently started on Entresto 49/51 mg BID at prior appt with Daune Perch on 05/08/2019. PMH is significant for chronic combined CHF, Afib, HTN, HLD, pulmonary artery HTN,moderate MR, T2DM, CKD stage 3, and hx of prostate cancer. Of note, patient is on apixaban 2.5 mg BID due to history of nosebleeds per Dr. [name].  Patient has experienced multiple hospitalizations within the past year. The most recent hospitalization was on 04/22/2019 due to SOB, edema, weight gain, orthopnea, and cough. His 2D echo showed recurrent LV dysfunction. Metoprolol dose was decreased and hydralazine was initiated during hospitalization. 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  Pt presents today for initial appt.    Why did he d/c amlodipine, carvedilol, HCTZ????  Current HTN/HF meds: Entresto 49/51 mg BID, hydralazine 50 mg TID, metoprolol XL 25 mg daily, furosemide 20 mg daily Previously tried: amlodipine, carvedilol, HCTZ, lisinopril (switched to losartan), losartan (switched to [place]), spironolactone (may have been discontinued when he asked PCP to simplify med regimen) BP goal: <130/80 mmHg  Family History: father (liver cancer, kidney stones)  Social History:   Diet:   Exercise:   Home BP readings:   Wt Readings from Last 3 Encounters:  05/08/19 249 lb (112.9 kg)  04/26/19 234 lb 6.4 oz (106.3 kg)  11/08/18 238 lb (108 kg)   BP Readings from Last 3 Encounters:  05/08/19 138/70  04/26/19 (!) 152/91  11/08/18 137/74   Pulse Readings from Last 3 Encounters:  05/08/19 63  04/26/19 (!) 52  11/08/18 60    Renal function: CrCl cannot be  calculated (Patient's most recent lab result is older than the maximum 21 days allowed.).  Past Medical History:  Diagnosis Date  . A-fib ([place])    RVR-DCCV  . Abnormal PSA 01/17/2012   10.38/4/5/ PSA:2013=6.96/ PSA 07/08/2011=6.26/ PSA 2006=8.6  . 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 Blood transfusion without reported diagnosis    during vagotomy 1976  . Cancer [facility]) 2006   L-2 solitary metastatic lesion/no rad tx ,asymptomatic  . Chronic kidney disease    nephrolithiasis right kidney  . ED (erectile dysfunction)   . GERD (gastroesophageal reflux disease)    PUD S/P PARTIAL VAGOTOMY  . Gynecomastia 04/21/11   seen by Dr.Murray, no rad tx  . H/O impacted cerumen    S/P ENT DR BATES  . History of echocardiogram    echo 4/16:  mild LVH, EF 55-60%, Gr 1 DD, no RWMA, mild AI, mild MR, mod LAE, mild TR  . Hx of cardiovascular stress test    Myoview 5/16:  EF 50%, no scar or ischemia, low risk  . Hydroureteronephrosis    right   . Hyperglycemia    WITHOUT DM2  . Hyperlipidemia   . Hypertension   . Insomnia   . Migraines    WITH AURA  . Nephrolithiasis   . Osteopenia   . Overweight   . Prostate cancer ([place]) 12/09/03 dx   Prostate ca/adenocarcinoma,gleason=3+3=6  pSA 4.6  . Prostate cancer ([place])    METASTATIC WITH L2 SCLEROTIC LESION ON LUPRON------DR. WOODRUFF  . RBBB (right bundle branch block)   . Rosacea   . Sinus bradycardia   . Sleep apnea    pt unaware  . Stroke [facility])    asymptomatic, found on head imaging  . Ulcer    peptic  . Use of leuprolide acetate (Lupron) 06/2005-11/2006   02/2008 degarelix 02/2008 x 3 doses    Current Outpatient Medications on File Prior to Visit  Medication Sig Dispense Refill  . apixaban (ELIQUIS) 2.5 MG TABS tablet Take 2.5 mg by mouth 2 (two) times daily.    Marland Kitchen atorvastatin (LIPITOR) 20 MG tablet Take 1 tablet (20 mg total) by mouth daily. (Patient taking differently: Take 20 mg by mouth every morning. ) 30 tablet 0   . benzonatate (TESSALON) 200 MG capsule Take 1 capsule (200 mg total) by mouth 3 (three) times daily as needed for cough. 20 capsule 0  . cholecalciferol (VITAMIN D3) 25 MCG (1000 UT) tablet Take 1,000 Units by mouth every morning.    . furosemide (LASIX) 20 MG tablet Take 20 mg by mouth daily.    . hydrALAZINE (APRESOLINE) 50 MG tablet Take 1 tablet (50 mg total) by mouth every 8 (eight) hours. 90 tablet 1  . loratadine (CLARITIN) 10 MG tablet Take 1 tablet (10 mg total) by mouth daily. 30 tablet 1  . metFORMIN (GLUCOPHAGE) 500 MG tablet Take 1 tablet (500 mg total) by mouth 2 (two) times daily with a meal. (Patient not taking: Reported on 04/22/2019) 60 tablet 0  . metoprolol succinate (TOPROL-XL) 25 MG 24 hr tablet Take 1 tablet (25 mg total) by mouth daily. 30 tablet 1  . sacubitril-valsartan (ENTRESTO) 49-51 MG Take 1 tablet by mouth 2 (two) times daily. 60 tablet 0   No current facility-administered medications on file prior to visit.     Allergies  Allergen Reactions  . Aspirin Other (See Comments)    History of "stomach ulcers," was told to not take this      Assessment/Plan:  1. Hypertension - goal BP <130/80 mmHg.   Thank you for involving pharmacy to assist in providing [name]'s care.   Drexel Iha, PharmD PGY2 Ambulatory Care Pharmacy Resident

## 2019-05-22 ENCOUNTER — Other Ambulatory Visit: Payer: Medicare Other

## 2019-05-22 ENCOUNTER — Ambulatory Visit: Payer: Medicare Other

## 2019-05-29 ENCOUNTER — Other Ambulatory Visit: Payer: Medicare Other

## 2019-05-29 ENCOUNTER — Other Ambulatory Visit: Payer: Self-pay

## 2019-05-29 DIAGNOSIS — I10 Essential (primary) hypertension: Secondary | ICD-10-CM

## 2019-05-30 LAB — BASIC METABOLIC PANEL
BUN/Creatinine Ratio: 20 (ref 10–24)
BUN: 24 mg/dL (ref 8–27)
CO2: 25 mmol/L (ref 20–29)
Calcium: 9.1 mg/dL (ref 8.6–10.2)
Chloride: 104 mmol/L (ref 96–106)
Creatinine, Ser: 1.21 mg/dL (ref 0.76–1.27)
GFR calc Af Amer: 64 mL/min/{1.73_m2} (ref >59)
GFR calc non Af Amer: 55 mL/min/{1.73_m2} — ABNORMAL LOW (ref >59)
Glucose: 154 mg/dL — ABNORMAL HIGH (ref 65–99)
Potassium: 4.1 mmol/L (ref 3.5–5.2)
Sodium: 144 mmol/L (ref 134–144)

## 2019-06-04 ENCOUNTER — Other Ambulatory Visit: Payer: Self-pay

## 2019-06-04 ENCOUNTER — Encounter: Payer: Self-pay | Admitting: Pharmacist

## 2019-06-04 ENCOUNTER — Ambulatory Visit (INDEPENDENT_AMBULATORY_CARE_PROVIDER_SITE_OTHER): Payer: Medicare Other | Admitting: Pharmacist

## 2019-06-04 VITALS — BP 182/90 | HR 60

## 2019-06-04 DIAGNOSIS — I10 Essential (primary) hypertension: Secondary | ICD-10-CM | POA: Diagnosis not present

## 2019-06-04 DIAGNOSIS — I5022 Chronic systolic (congestive) heart failure: Secondary | ICD-10-CM | POA: Diagnosis not present

## 2019-06-04 NOTE — Patient Instructions (Addendum)
It was a pleasure to meet you today  Goals from today's visit  1. Please focus on remembering to take your medications everyday. Your Entresto, metoprolol and furosemide will help you feel better and keep you out of the hospital. Your hydralazine is important in keeping your blood pressure down.  2. Please check your blood pressure at home once daily and keep a log. Bring your meter with you to your next visit so we can check to see if it is accurate.  Follow up on Dec 14 @ 1:30  Please call us at [phone] with any questions for concerns

## 2019-06-04 NOTE — Progress Notes (Signed)
Patient ID: [name]                 DOB: March 02, 1935                      MRN: [id number]     HPI: [name] is a 84 y.o. male patient of Dr. Antionette Char referred by Dr. Pecolia Ades, NP to PharmD clinic. PMH is significant for  chronic combined CHF, paroxysmal atrial fibrillation on Eliquis, HTN, HLD, pulmonary artery HTN,moderate MR,DM type 2 and CKD stage 3. He has had tumultous year. He was admitted [id number] with diastolic CHF. He was admitted 06/2018 with new onset AF and drop in LV function (EF 20%) and underwent TEE/DCCV. Repeat echo 09/2018 showed normalization of EF. Admitted 10/2018 with vasovagal syncope. Readmitted 04/2019 with SOB, edema, weight gain, orthopnea, and cough. 2D echo showed recurrent LV dysfunction (EF 35-40%). Of note, patient is on the reduced dose of Eliquis due to recurrent nose bleeds. He was tried on Xarelto and nose bleeds resumes. Therefore his PCP resumed him on the lower dose of Eliquis. Patient was started on [facility] 05/08/2019.   Patient is here today for follow up after starting Entresto and for possible medication titration. Patient admits that he is a "terrbile patient." He will often go several days without taking his medications. 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 He did not realize his hydralazine was supposed to be three times a day. He does not weigh himself everyday nor does he check his blood pressure daily. He bought a new cuff at [facility] but it told him his BP was [id number] systolic so he returned it. He denies any headaches or blurred vision. States his SOB is about the same but his ability to do activity has improved. Denies any swelling. Admits to some dizziness if he turns around too quickly.  Patient did not take his  AM medications today and his last dose of blood pressure medications was 7PM last night. He has been taking hydralazine only twice daily. States he watches his salt intake. Does not  do constructive exercise, but is active. He cares more about his quality of life than quanity. Upset because his grandchildren are moving to the other side of [place].   Current CHF meds:  Entresto 49/51mg  twice a day, metoprolol succinate 25mg  daily, hydralazie 50mg  three times a day, furosemide 20mg  daily Previously tried: spironolactone (unknown why it was stopped) metoprolol XL 50mg  (bradycardia)  Family History: The patient's family history includes Kidney Stones in his father; Liver cancer in his father. There is no history of Colon cancer or Stroke.  Social History: former smoker, no ETOH use  Diet: rarely puts salt on food,   Exercise: lots a stairs in house- does not walk  Home BP readings: 180's/80's  Wt Readings from Last 3 Encounters:  05/08/19 249 lb (112.9 kg)  04/26/19 234 lb 6.4 oz (106.3 kg)  11/08/18 238 lb (108 kg)   BP Readings from Last 3 Encounters:  05/08/19 138/70  04/26/19 (!) 152/91  11/08/18 137/74   Pulse Readings from Last 3 Encounters:  05/08/19 63  04/26/19 (!) 52  11/08/18 60    Renal function: CrCl cannot be calculated (Unknown ideal weight.).  Past Medical History:  Diagnosis Date  . A-fib ([place])    RVR-DCCV  . Abnormal PSA 01/17/2012   10.38/4/5/ PSA:2013=6.96/ PSA 07/08/2011=6.26/ PSA 2006=8.6  . Aneurysm ([place])  MILD 2.7 CM FUSIFORM llIAC ANEURYSM  . Blood transfusion without reported diagnosis    during vagotomy 1976  . Cancer [facility]) 2006   L-2 solitary metastatic lesion/no rad tx ,asymptomatic  . Chronic kidney disease    nephrolithiasis right kidney  . ED (erectile dysfunction)   . GERD (gastroesophageal reflux disease)    PUD S/P PARTIAL VAGOTOMY  . Gynecomastia 04/21/11   seen by Dr.Murray, no rad tx  . H/O impacted cerumen    S/P ENT DR BATES  . History of echocardiogram    echo 4/16:  mild LVH, EF 55-60%, Gr 1 DD, no RWMA, mild AI, mild MR, mod LAE, mild TR  . Hx of cardiovascular stress test    Myoview 5/16:  EF  50%, no scar or ischemia, low risk  . Hydroureteronephrosis    right   . Hyperglycemia    WITHOUT DM2  . Hyperlipidemia   . Hypertension   . Insomnia   . Migraines    WITH AURA  . Nephrolithiasis   . Osteopenia   . Overweight   . Prostate cancer ([place]) 12/09/03 dx   Prostate ca/adenocarcinoma,gleason=3+3=6    pSA 4.6  . Prostate cancer ([place])    METASTATIC WITH L2 SCLEROTIC LESION ON LUPRON------DR. WOODRUFF  . RBBB (right bundle branch block)   . Rosacea   . Sinus bradycardia   . Sleep apnea    pt unaware  . Stroke [facility])    asymptomatic, found on head imaging  . Ulcer    peptic  . Use of leuprolide acetate (Lupron) 06/2005-11/2006   02/2008 degarelix 02/2008 x 3 doses    Current Outpatient Medications on File Prior to Visit  Medication Sig Dispense Refill  . apixaban (ELIQUIS) 2.5 MG TABS tablet Take 2.5 mg by mouth 2 (two) times daily.    Marland Kitchen atorvastatin (LIPITOR) 20 MG tablet Take 1 tablet (20 mg total) by mouth daily. (Patient taking differently: Take 20 mg by mouth every morning. ) 30 tablet 0  . benzonatate (TESSALON) 200 MG capsule Take 1 capsule (200 mg total) by mouth 3 (three) times daily as needed for cough. 20 capsule 0  . cholecalciferol (VITAMIN D3) 25 MCG (1000 UT) tablet Take 1,000 Units by mouth every morning.    . furosemide (LASIX) 20 MG tablet Take 20 mg by mouth daily.    . hydrALAZINE (APRESOLINE) 50 MG tablet Take 1 tablet (50 mg total) by mouth every 8 (eight) hours. 90 tablet 1  . loratadine (CLARITIN) 10 MG tablet Take 1 tablet (10 mg total) by mouth daily. 30 tablet 1  . metFORMIN (GLUCOPHAGE) 500 MG tablet Take 1 tablet (500 mg total) by mouth 2 (two) times daily with a meal. (Patient not taking: Reported on 04/22/2019) 60 tablet 0  . metoprolol succinate (TOPROL-XL) 25 MG 24 hr tablet Take 1 tablet (25 mg total) by mouth daily. 30 tablet 1  . sacubitril-valsartan (ENTRESTO) 49-51 MG Take 1 tablet by mouth 2 (two) times daily. 60 tablet 0   No current  facility-administered medications on file prior to visit.     Allergies  Allergen Reactions  . Aspirin Other (See Comments)    History of "stomach ulcers," was told to not take this     There were no vitals taken for this visit.   Assessment/Plan:  1. CHF medication titration - Blood pressure is significantly elevated today. Patient however, has not taken any of his medications since 7PM last night. Discussed importance of medications on making  him feel better and keeping him out of the hospital. Discussed using alarms or an app to help him remember to take his medications. Patient states he will try harder not to miss medications. He will also take his medications as soon as he gets home. I have asked him to take his medications daily and check his blood pressure once daily. He was also asked to bring his meter with him to his next appointment and take his medications at least 1 hr prior to coming to his appointment. I will not make any changes today as I do not have an accurate picture of his blood pressure on Entresto. Scr and K were stable on labs, therefore can increase Entresto at next appointment if needed. Follow up in clinic in 2 weeks.   Thank you  Ramond Dial, Pharm.D, BCPS, CPP [place]  [id number], [place], [place] [address]  Phone: [phone]; Fax: [phone]

## 2019-06-12 ENCOUNTER — Other Ambulatory Visit: Payer: Self-pay

## 2019-06-12 MED ORDER — ENTRESTO 49-51 MG PO TABS: 1.0000 | ORAL_TABLET | Freq: Two times a day (BID) | ORAL | 10 refills | Status: DC

## 2019-06-15 NOTE — Progress Notes (Signed)
Patient ID: [name]                 DOB: 02-02-1935                      MRN: [id number]     HPI: [name] is a 84 y.o. male patient of Dr. Antionette Char referred by Dr. Pecolia Ades, NP to PharmD clinic. PMH is significant for  chronic combined CHF, paroxysmal atrial fibrillation on Eliquis, HTN, HLD, pulmonary artery HTN,moderate MR,DM type 2 and CKD stage 3. He has had tumultous year. He was admitted [id number] with diastolic CHF. He was admitted 06/2018 with new onset AF and drop in LV function (EF 20%) and underwent TEE/DCCV. Repeat echo 09/2018 showed normalization of EF. Admitted 10/2018 with vasovagal syncope. Readmitted 04/2019 with SOB, edema, weight gain, orthopnea, and cough. 2D echo showed recurrent LV dysfunction (EF 35-40%). Of note, patient is on the reduced dose of Eliquis due to recurrent nose bleeds. He was tried on Xarelto and nose bleeds resumed. Therefore his PCP resumed him on the lower dose of Eliquis. Patient was started on [facility] 05/08/2019.   At last appointment with pharmacist, his Delene Loll was continued at the same dose despite BP of 182/90 since he had not taken his medication since the night prior and was not checking BP at home. He admitted to forgetting to take his medications frequently and would often go several days without taking his medications. He was also only taking his hydralazine twice daily.   Patient presents for follow up. He states he forgets to take his medications typically in the evening but has only missed about 2 doses in the last week or so. He remembered to take his medication this morning when he got up around 11AM. He reports he has been taking his hydralazine TID since his last visit. He denies shortness of breath, dizziness, lightheadedness, or swelling. Of note, he says he checks his BP at home right before taking his medications. His BP in clinic was 124/64. He brought in his home BP cuff (he says it is about 82 years old) to check the accuracy,  but received an error message (Error [phone]) twice - confirmed it was positioned correctly.  Current CHF meds:  Entresto 49/51mg  twice a day, metoprolol succinate 25mg  daily, hydralazie 50mg  three times a day, furosemide 20mg  daily  Previously tried: spironolactone (unknown why it was stopped during hospital admission) metoprolol XL 50mg  (bradycardia)  Family History: The patient's family history includes Kidney Stones in his father; Liver cancer in his father. There is no history of Colon cancer or Stroke.  Social History: former smoker, no ETOH use  Diet: rarely puts salt on food  Exercise: lots a stairs in house- does not walk  Home BP readings (prior to taking medications): 179/94, 169/81, 143/82, 158/89, 1467/85, 176/94, 157/83 Home HR readings: 57, 54, 56, 52, 74, 56, 59  Wt Readings from Last 3 Encounters:  05/08/19 249 lb (112.9 kg)  04/26/19 234 lb 6.4 oz (106.3 kg)  11/08/18 238 lb (108 kg)   BP Readings from Last 3 Encounters:  06/04/19 (!) 182/90  05/08/19 138/70  04/26/19 (!) 152/91   Pulse Readings from Last 3 Encounters:  06/04/19 60  05/08/19 63  04/26/19 (!) 52    Renal function: CrCl cannot be calculated (Unknown ideal weight.).  Past Medical History:  Diagnosis Date  . A-fib ([place])    RVR-DCCV  . Abnormal PSA 01/17/2012   10.38/4/5/ PSA:2013=6.96/  PSA 07/08/2011=6.26/ PSA 2006=8.6  . 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 Blood transfusion without reported diagnosis    during vagotomy 1976  . Cancer [facility]) 2006   L-2 solitary metastatic lesion/no rad tx ,asymptomatic  . Chronic kidney disease    nephrolithiasis right kidney  . ED (erectile dysfunction)   . GERD (gastroesophageal reflux disease)    PUD S/P PARTIAL VAGOTOMY  . Gynecomastia 04/21/11   seen by Dr.Murray, no rad tx  . H/O impacted cerumen    S/P ENT DR BATES  . History of echocardiogram    echo 4/16:  mild LVH, EF 55-60%, Gr 1 DD, no RWMA, mild AI, mild MR, mod LAE,  mild TR  . Hx of cardiovascular stress test    Myoview 5/16:  EF 50%, no scar or ischemia, low risk  . Hydroureteronephrosis    right   . Hyperglycemia    WITHOUT DM2  . Hyperlipidemia   . Hypertension   . Insomnia   . Migraines    WITH AURA  . Nephrolithiasis   . Osteopenia   . Overweight   . Prostate cancer ([place]) 12/09/03 dx   Prostate ca/adenocarcinoma,gleason=3+3=6    pSA 4.6  . Prostate cancer ([place])    METASTATIC WITH L2 SCLEROTIC LESION ON LUPRON------DR. WOODRUFF  . RBBB (right bundle branch block)   . Rosacea   . Sinus bradycardia   . Sleep apnea    pt unaware  . Stroke [facility])    asymptomatic, found on head imaging  . Ulcer    peptic  . Use of leuprolide acetate (Lupron) 06/2005-11/2006   02/2008 degarelix 02/2008 x 3 doses    Current Outpatient Medications on File Prior to Visit  Medication Sig Dispense Refill  . apixaban (ELIQUIS) 2.5 MG TABS tablet Take 2.5 mg by mouth 2 (two) times daily.    Marland Kitchen atorvastatin (LIPITOR) 20 MG tablet Take 1 tablet (20 mg total) by mouth daily. (Patient taking differently: Take 20 mg by mouth every morning. ) 30 tablet 0  . benzonatate (TESSALON) 200 MG capsule Take 1 capsule (200 mg total) by mouth 3 (three) times daily as needed for cough. (Patient not taking: Reported on 06/04/2019) 20 capsule 0  . CALCIUM CITRATE PO Take 600 mg by mouth 2 (two) times daily.    . cholecalciferol (VITAMIN D3) 25 MCG (1000 UT) tablet Take 1,000 Units by mouth every morning.    . furosemide (LASIX) 20 MG tablet Take 20 mg by mouth daily.    . hydrALAZINE (APRESOLINE) 50 MG tablet Take 1 tablet (50 mg total) by mouth every 8 (eight) hours. 90 tablet 1  . loratadine (CLARITIN) 10 MG tablet Take 1 tablet (10 mg total) by mouth daily. (Patient not taking: Reported on 06/04/2019) 30 tablet 1  . metFORMIN (GLUCOPHAGE) 500 MG tablet Take 1 tablet (500 mg total) by mouth 2 (two) times daily with a meal. (Patient not taking: Reported on 04/22/2019) 60 tablet 0  .  metoprolol succinate (TOPROL-XL) 25 MG 24 hr tablet Take 1 tablet (25 mg total) by mouth daily. 30 tablet 1  . sacubitril-valsartan (ENTRESTO) 49-51 MG Take 1 tablet by mouth 2 (two) times daily. 60 tablet 10  . Thiamine HCl (VITAMIN B-1) 250 MG tablet Take 250 mg by mouth daily.    . vitamin B-12 (CYANOCOBALAMIN) 100 MCG tablet Take 100 mcg by mouth daily.     No current facility-administered medications on file prior to visit.  Allergies  Allergen Reactions  . Aspirin Other (See Comments)    History of "stomach ulcers," was told to not take this     There were no vitals taken for this visit.   Assessment/Plan:  1. CHF medication titration - BP is within goal <130/80. Continue Entresto 49/51 mg BID, metoprolol succinate 25 mg daily, hydralazie 50 mg TID, and furosemide 20 mg daily. Informed pt he should buy a new BP cuff as the one he currently has is no longer functioning properly. Instructed he should check his BP about 1-2 hours after taking his medications and to call if his BP remains high. Provided pt with information on Entresto copay assistance for Medicare patients. Follow up with Dr. Burt Knack in February.   Vertis Kelch, PharmD, [place] PGY2 Cardiology Pharmacy Resident [place] [id number] [place], [place] [address] Phone: [phone]; Fax: [phone]

## 2019-06-18 ENCOUNTER — Other Ambulatory Visit: Payer: Self-pay

## 2019-06-18 ENCOUNTER — Ambulatory Visit (INDEPENDENT_AMBULATORY_CARE_PROVIDER_SITE_OTHER): Payer: Medicare Other

## 2019-06-18 VITALS — BP 124/64 | HR 64

## 2019-06-18 DIAGNOSIS — I5022 Chronic systolic (congestive) heart failure: Secondary | ICD-10-CM

## 2019-06-18 DIAGNOSIS — I10 Essential (primary) hypertension: Secondary | ICD-10-CM

## 2019-06-18 NOTE — Patient Instructions (Addendum)
It was nice to see you today!  Your BP goal is < 130/80.  Continue taking your medications as prescribed.  Please buy a new blood pressure cuff and start taking your blood pressure about 2 hours after you take your medicines.  Call us if your blood pressure remains high and we can schedule another appointment with you before your next visit with Dr. Burt Knack in February.   Our clinic phone number: [phone]  Entresto phone number for patient assistance: [phone]

## 2019-08-10 ENCOUNTER — Other Ambulatory Visit: Payer: Self-pay

## 2019-08-10 ENCOUNTER — Encounter: Payer: Self-pay | Admitting: Cardiovascular Disease

## 2019-08-10 ENCOUNTER — Ambulatory Visit: Payer: Medicare Other | Admitting: Cardiovascular Disease

## 2019-08-10 VITALS — BP 162/94 | HR 67 | Ht 72.0 in | Wt 251.4 lb

## 2019-08-10 DIAGNOSIS — I2510 Atherosclerotic heart disease of native coronary artery without angina pectoris: Secondary | ICD-10-CM

## 2019-08-10 DIAGNOSIS — I5022 Chronic systolic (congestive) heart failure: Secondary | ICD-10-CM

## 2019-08-10 DIAGNOSIS — I480 Paroxysmal atrial fibrillation: Secondary | ICD-10-CM

## 2019-08-10 MED ORDER — SACUBITRIL-VALSARTAN 97-103 MG PO TABS: 1.0000 | ORAL_TABLET | Freq: Two times a day (BID) | ORAL | 3 refills | Status: DC

## 2019-08-10 NOTE — Progress Notes (Signed)
Cardiology Office Note:    Date:  08/10/2019   ID:  [name], DOB 1935/06/10, MRN [id number]  PCP:  [name], MD  Cardiologist:  Sherren Mocha, MD  Electrophysiologist:  Thompson Grayer, MD   Referring MD: [name], MD   Chief Complaint  Patient presents with  . Shortness of Breath    History of Present Illness:    [name] is a 85 y.o. male with a hx of paroxysmal atrial fibrillation, chronic combined systolic and diastolic heart failure, hypertension, mixed hyperlipidemia, type 2 diabetes, stage III chronic kidney disease, and moderate mitral regurgitation who presents for follow-up evaluation.  The patient is here alone today.  He feels like he is doing okay from a cardiac perspective.  He has mild dyspnea with exertion but denies orthopnea, PND, or leg swelling.  He has had no recent chest pain or pressure.  He denies lightheadedness or syncope.  He does have some exercise intolerance and notes that it is more difficult for him to complete tasks than it has been in the past.  He changed a water heater recently and felt pretty exhausted after that.  Past Medical History:  Diagnosis Date  . A-fib ([place])    RVR-DCCV  . Abnormal PSA 01/17/2012   10.38/4/5/ PSA:2013=6.96/ PSA 07/08/2011=6.26/ PSA 2006=8.6  . 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 Blood transfusion without reported diagnosis    during vagotomy 1976  . Cancer [facility]) 2006   L-2 solitary metastatic lesion/no rad tx ,asymptomatic  . Chronic kidney disease    nephrolithiasis right kidney  . ED (erectile dysfunction)   . GERD (gastroesophageal reflux disease)    PUD S/P PARTIAL VAGOTOMY  . Gynecomastia 04/21/11   seen by Dr.Murray, no rad tx  . H/O impacted cerumen    S/P ENT DR BATES  . History of echocardiogram    echo 4/16:  mild LVH, EF 55-60%, Gr 1 DD, no RWMA, mild AI, mild MR, mod LAE, mild TR  . Hx of cardiovascular stress test    Myoview 5/16:  EF 50%, no scar or ischemia, low  risk  . Hydroureteronephrosis    right   . Hyperglycemia    WITHOUT DM2  . Hyperlipidemia   . Hypertension   . Insomnia   . Migraines    WITH AURA  . Nephrolithiasis   . Osteopenia   . Overweight   . Prostate cancer ([place]) 12/09/03 dx   Prostate ca/adenocarcinoma,gleason=3+3=6    pSA 4.6  . Prostate cancer ([place])    METASTATIC WITH L2 SCLEROTIC LESION ON LUPRON------DR. WOODRUFF  . RBBB (right bundle branch block)   . Rosacea   . Sinus bradycardia   . Sleep apnea    pt unaware  . Stroke [facility])    asymptomatic, found on head imaging  . Ulcer    peptic  . Use of leuprolide acetate (Lupron) 06/2005-11/2006   02/2008 degarelix 02/2008 x 3 doses    Past Surgical History:  Procedure Laterality Date  . CARDIOVERSION N/A 06/19/2018   Procedure: CARDIOVERSION;  Surgeon: Larey Dresser, MD;  Location: [facility] ENDOSCOPY;  Service: Cardiovascular;  Laterality: N/A;  . PROSTATE BIOPSY  12/09/03   adenocarcinoma,glerason: 3+3=6  . RIGHT/LEFT HEART CATH AND CORONARY ANGIOGRAPHY N/A 04/25/2019   Procedure: RIGHT/LEFT HEART CATH AND CORONARY ANGIOGRAPHY;  Surgeon: Leonie Man, MD;  Location: [place] CV LAB;  Service: Cardiovascular;  Laterality: N/A;  . TEE WITHOUT CARDIOVERSION N/A 06/19/2018  Procedure: TRANSESOPHAGEAL ECHOCARDIOGRAM (TEE);  Surgeon: Larey Dresser, MD;  Location: [facility] ENDOSCOPY;  Service: Cardiovascular;  Laterality: N/A;  . VAGOTOMY  1978   partial    Current Medications: Current Meds  Medication Sig  . atorvastatin (LIPITOR) 20 MG tablet Take 1 tablet (20 mg total) by mouth daily.  Marland Kitchen CALCIUM CITRATE PO Take 600 mg by mouth 2 (two) times daily.  . cholecalciferol (VITAMIN D3) 25 MCG (1000 UT) tablet Take 1,000 Units by mouth every morning.  . furosemide (LASIX) 40 MG tablet Take 40 mg by mouth daily.   Marland Kitchen GLUCOSAMINE-CHONDROIT-CALCIUM PO Take 1 capsule by mouth daily.  Nyoka Cowden Coffee Bean-Yerba Mate (GREEN COFFEE BEAN EXTRACT PO) Take 1 capsule by mouth daily.  .  metFORMIN (GLUCOPHAGE) 500 MG tablet Take 1 tablet (500 mg total) by mouth 2 (two) times daily with a meal.  . metoprolol succinate (TOPROL-XL) 50 MG 24 hr tablet Take 50 mg by mouth daily. Take with or immediately following a meal.  . Thiamine HCl (VITAMIN B-1) 250 MG tablet Take 250 mg by mouth daily.  . vitamin B-12 (CYANOCOBALAMIN) 100 MCG tablet Take 100 mcg by mouth daily.  Alveda Reasons 20 MG TABS tablet Take 20 mg by mouth daily.  . [DISCONTINUED] sacubitril-valsartan (ENTRESTO) 49-51 MG Take 1 tablet by mouth 2 (two) times daily.     Allergies:   Aspirin   Social History   Socioeconomic History  . Marital status: Widowed    Spouse name: Not on file  . Number of children: 2  . Years of education: Not on file  . Highest education level: Not on file  Occupational History  . 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 Smoking status: Former Smoker    Types: Pipe    Quit date: 07/05/1984    Years since quitting: 35.1  . Smokeless tobacco: Never Used  Substance and Sexual Activity  . Alcohol use: No  . Drug use: No  . Sexual activity: Never  Other Topics Concern  . Not on file  Social History Narrative   Pt lives in [place], alone.  Widower.   Retired Animator.   Social Determinants of Health   Financial Resource Strain:   . Difficulty of Paying Living Expenses: Not on file  Food Insecurity:   . Worried About Charity fundraiser in the Last Year: Not on file  . Ran Out of Food in the Last Year: Not on file  Transportation Needs:   . Lack of Transportation (Medical): Not on file  . Lack of Transportation (Non-Medical): Not on file  Physical Activity:   . Days of Exercise per Week: Not on file  . Minutes of Exercise per Session: Not on file  Stress:   . Feeling of Stress : Not on file  Social Connections:   . Frequency of Communication with Friends and Family: Not on file  . Frequency of Social Gatherings with Friends and Family:  Not on file  . Attends Religious Services: Not on file  . Active Member of Clubs or Organizations: Not on file  . Attends Archivist Meetings: Not on file  . Marital Status: Not on file     Family History: The patient's family history includes Kidney Stones in his father; Liver cancer in his father. There is no history of Colon cancer or Stroke.  ROS:   Please see the history of present illness.    All other  systems reviewed and are negative.  EKGs/Labs/Other Studies Reviewed:    The following studies were reviewed today: Cardiac Catheterization 04-25-2019: Conclusion    Hemodynamic findings consistent with mild pulmonary hypertension - mean PAP 28 mmHg  LV end diastolic pressure is mildly elevated. - LVEDP 12-15 mmHg  Cardiac output-index 7.44-3.27.  ---------------------------------  Ost LM to Mid LM lesion is 20% stenosed. Ost Cx lesion is 30% stenosed. -- calcified  1st Mrg lesion is 60% stenosed.  Dist LAD lesion is 55% stenosed with 55% stenosed side branch in 2nd Diag. - the downstream LAD is relatively the same diameter as noted in the stenosis.  Otherwise minimal to mild disease throughout.   SUMMARY  Diffuse mild to moderate CAD: Most notable 60% proximal OM1, 50 to 60% mid LAD at D2.  No obvious culprit lesion to explain significantly reduced EF.  Mild proximal LM and ostial LCx-calcified lesions.  Mild pulmonary hypertension with mean PA pressure of 28 mmHg.  LVEDP and PCWP from 12 to 15 mmHg.  Borderline CO-CI  RECOMMENDATIONS  Return to nursing unit for ongoing care, TR band removal.  Would continue aggressive risk factor modification and heart failure medications.    Appears to be at relatively well diuresed, would titrate up afterload reduction  If he has any concerning symptoms for possible angina, would consider noninvasive evaluation with Myoview, or perhaps coronary CTA to determine presence of either anterior or lateral ischemia  suggesting LAD or OM lesions are more significant than they appear angiographically.    Coronary Diagrams  Diagnostic Dominance: Right   Echo 04-23-2019: IMPRESSIONS    1. Left ventricular ejection fraction, by visual estimation, is 35 to  40%. The left ventricle has moderately decreased function. Moderately  increased left ventricular size. There is no left ventricular hypertrophy.  2. Diffuse hypokinesis worse in inferior wall.  3. Global right ventricle has normal systolic function.The right  ventricular size is normal. No increase in right ventricular wall  thickness.  4. Left atrial size was normal.  5. Right atrial size was normal.  6. The mitral valve is normal in structure. Mild to moderate mitral valve  regurgitation.  7. Eccentric anteriorly directed and not well characterized by color  flow.  8. The tricuspid valve is normal in structure. Tricuspid valve  regurgitation moderate.  9. The aortic valve is tricuspid Aortic valve regurgitation is mild by  color flow Doppler. Mild to moderate aortic valve sclerosis/calcification  without any evidence of aortic stenosis.  10. The pulmonic valve was grossly normal. Pulmonic valve regurgitation is  mild by color flow Doppler.  11. Moderately elevated pulmonary artery systolic pressure.   FINDINGS  Left Ventricle: Left ventricular ejection fraction, by visual estimation,  is 35 to 40%. The left ventricle has moderately decreased function. There  is no left ventricular hypertrophy. Moderately increased left ventricular  size. Diffuse hypokinesis worse  in inferior wall.   Right Ventricle: The right ventricular size is normal. No increase in  right ventricular wall thickness. Global RV systolic function is has  normal systolic function. The tricuspid regurgitant velocity is 3.52 m/s,  and with an assumed right atrial pressure  of 3 mmHg, the estimated right ventricular systolic pressure is  moderately  elevated at 52.6 mmHg.   Left Atrium: Left atrial size was normal in size.   Right Atrium: Right atrial size was normal in size   Pericardium: There is no evidence of pericardial effusion.   Mitral Valve: The mitral valve is normal in  structure. There is mild  thickening of the mitral valve leaflet(s). There is mild calcification of  the mitral valve leaflet(s). Mild to moderate mitral valve regurgitation.  Eccentric anteriorly directed and not  well characterized by color flow.   Tricuspid Valve: The tricuspid valve is normal in structure. Tricuspid  valve regurgitation moderate by color flow Doppler.   Aortic Valve: The aortic valve is tricuspid. Aortic valve regurgitation is  mild by color flow Doppler. Aortic regurgitation PHT measures 585 msec.  Mild to moderate aortic valve sclerosis/calcification is present, without  any evidence of aortic stenosis.  Aortic valve mean gradient measures 3.0 mmHg. Aortic valve peak gradient  measures 6.5 mmHg. Aortic valve area, by VTI measures 2.34 cm.   Pulmonic Valve: The pulmonic valve was grossly normal. Pulmonic valve  regurgitation is mild by color flow Doppler.   Aorta: The aortic root is normal in size and structure.   IAS/Shunts: No atrial level shunt detected by color flow Doppler.   EKG:  EKG is not ordered today.   Recent Labs: 04/22/2019: ALT 24; TSH 1.485 04/24/2019: B Natriuretic Peptide 163.1 04/26/2019: Hemoglobin 13.1; Magnesium 2.2; Platelets 216 05/29/2019: BUN 24; Creatinine, Ser 1.21; Potassium 4.1; Sodium 144  Recent Lipid Panel    Component Value Date/Time   CHOL 129 04/22/2019 1759   TRIG 118 04/22/2019 1759   HDL 34 (L) 04/22/2019 1759   CHOLHDL 3.8 04/22/2019 1759   VLDL 24 04/22/2019 1759   LDLCALC 71 04/22/2019 1759    Physical Exam:    VS:  BP (!) 162/94   Pulse 67   Ht 6' (1.829 m)   Wt 251 lb 6.4 oz (114 kg)   SpO2 96%   BMI 34.10 kg/m     Wt Readings from Last 3 Encounters:  08/10/19  251 lb 6.4 oz (114 kg)  05/08/19 249 lb (112.9 kg)  04/26/19 234 lb 6.4 oz (106.3 kg)     GEN:  Well nourished, well developed in no acute distress HEENT: Normal NECK: No JVD; No carotid bruits LYMPHATICS: No lymphadenopathy CARDIAC: RRR, no murmurs, rubs, gallops RESPIRATORY:  Clear to auscultation without rales, wheezing or rhonchi  ABDOMEN: Soft, non-tender, non-distended MUSCULOSKELETAL:  No edema; No deformity  SKIN: Warm and dry NEUROLOGIC:  Alert and oriented x 3 PSYCHIATRIC:  Normal affect   ASSESSMENT:    1. Chronic systolic CHF (congestive heart failure) (HCC)   2. Paroxysmal atrial fibrillation ([place])   3. Coronary artery disease involving native coronary artery of native heart without angina pectoris    PLAN:    In order of problems listed above:  1. The patient appears stable with New York Heart Association functional class IIb symptoms.  He has no exam evidence of volume excess.  He will continue on Entresto, metoprolol succinate, and furosemide.  I do not think he would tolerate spironolactone well since his potassium is already in the high normal range.  I did review his home blood pressure readings today and they are above goal.  I recommended that he increase Entresto to the 97/103 mg dose.  Will arrange APP follow-up in 6 months and I will see him back in 1 year. 2. Currently anticoagulated with rivaroxaban.  Appears stable and no changes are made in his regimen.  He is treated with a beta-blocker. 3. I reviewed his heart catheterization from October.  He was noted to have moderate nonobstructive disease.  Medical therapy is indicated with ongoing risk reduction measures.  He is treated with  a statin drug.  He is not on antiplatelet therapy because of chronic oral anticoagulation.   Medication Adjustments/Labs and Tests Ordered: Current medicines are reviewed at length with the patient today.  Concerns regarding medicines are outlined above.  No orders of the  defined types were placed in this encounter.  Meds ordered this encounter  Medications  . sacubitril-valsartan (ENTRESTO) 97-103 MG    Sig: Take 1 tablet by mouth 2 (two) times daily.    Dispense:  180 tablet    Refill:  3    Patient Instructions  Medication Instructions:  1) INCREASE ENTRESTO to 97/103 mg twice daily *If you need a refill on your cardiac medications before your next appointment, please call your pharmacy*  Follow-Up: At [facility], you and your health needs are our priority.  As part of our continuing mission to provide you with exceptional heart care, we have created designated Provider Care Teams.  These Care Teams include your primary Cardiologist (physician) and Advanced Practice Providers (APPs -  Physician Assistants and Nurse Practitioners) who all work together to provide you with the care you need, when you need it. Your next appointment:   6 month(s) The format for your next appointment:   In Person Provider:   [name], PA-C     Signed, Sherren Mocha, MD  08/10/2019 1:35 PM    [place]

## 2019-08-10 NOTE — Patient Instructions (Addendum)
Medication Instructions:  1) INCREASE ENTRESTO to 97/103 mg twice daily *If you need a refill on your cardiac medications before your next appointment, please call your pharmacy*  Follow-Up: At [facility], you and your health needs are our priority.  As part of our continuing mission to provide you with exceptional heart care, we have created designated Provider Care Teams.  These Care Teams include your primary Cardiologist (physician) and Advanced Practice Providers (APPs -  Physician Assistants and Nurse Practitioners) who all work together to provide you with the care you need, when you need it. Your next appointment:   6 month(s) The format for your next appointment:   In Person Provider:   [name], PA-C

## 2019-10-28 IMAGING — MR MRI HEAD WITHOUT CONTRAST
12 of 13 series · 43 of 48 positions shown · non-contrast
Comparison: None.

CLINICAL DATA: Syncope. Episode of blurred vision. Syncopal episode
associated with bowel movement.

EXAM:
MRI HEAD WITHOUT CONTRAST
TECHNIQUE: Multiplanar, multiecho pulse sequences of the brain and surrounding
structures were obtained without intravenous contrast.

[Series 5: DWI · axial · 4.0mm · 0.88mm/px · z∈[-17,+113]mm · 5 of 70 slices shown (1 of 4)]
[im 1/70]
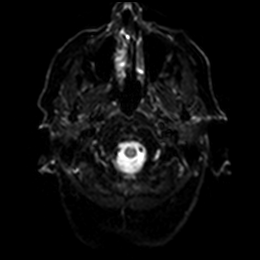
[im 18/70]
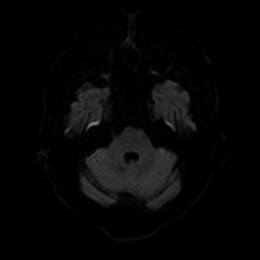
[im 35/70]
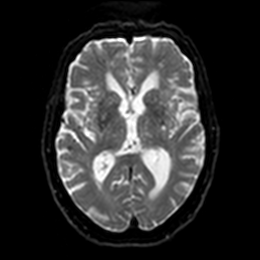
[im 52/70]
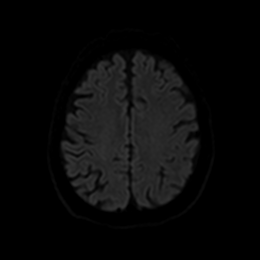
[im 70/70]
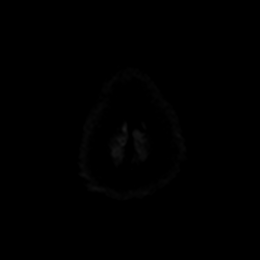

[Series 6: DWI · axial · 4.0mm · 0.88mm/px · z∈[-17,+113]mm · 3 of 35 slices shown (2 of 4)]
[im 1/35]
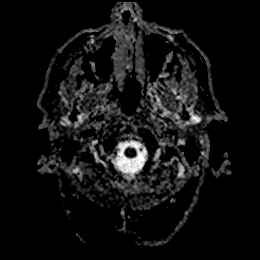
[im 18/35]
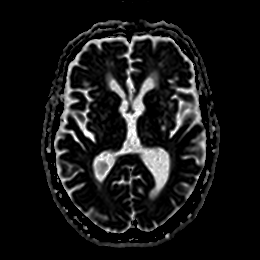
[im 35/35]
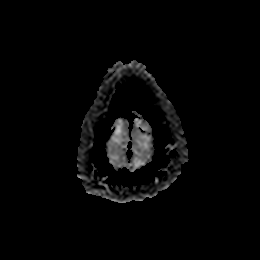

[Series 7: DWI · coronal · 4.0mm · 0.88mm/px · 5 of 68 slices shown (3 of 4)]
[im 1/68]
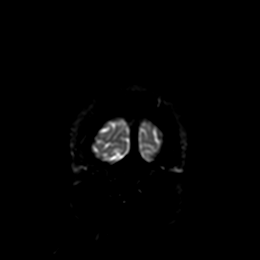
[im 17/68]
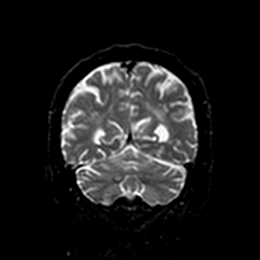
[im 34/68]
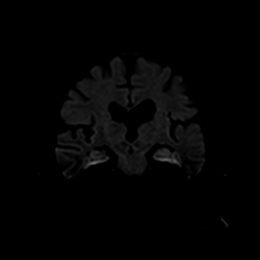
[im 51/68]
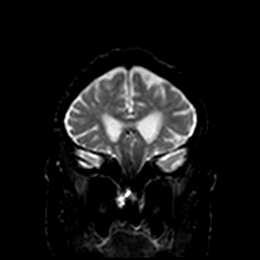
[im 68/68]
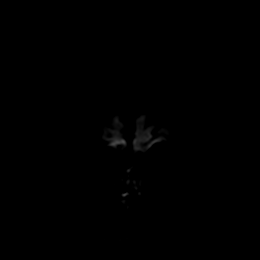

[Series 8: DWI · coronal · 4.0mm · 0.88mm/px · 3 of 32 slices shown (4 of 4)]
[im 1/32]
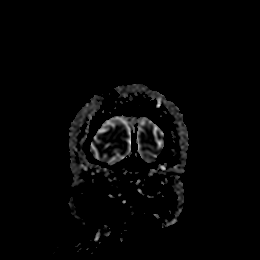
[im 16/32]
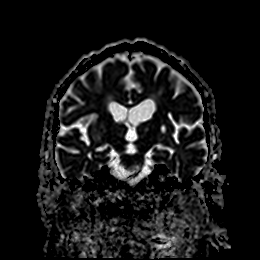
[im 32/32]
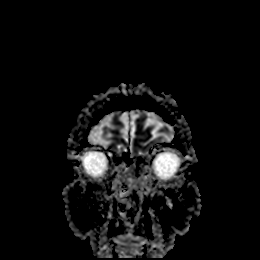

[Series 9: T1 · sagittal · 5.0mm · 0.75mm/px · 2 of 24 slices shown]
[im 1/24]
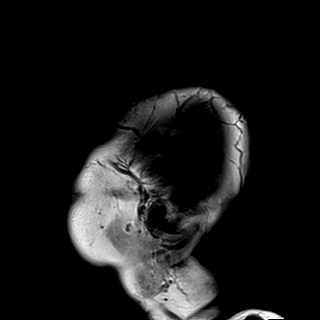
[im 24/24]
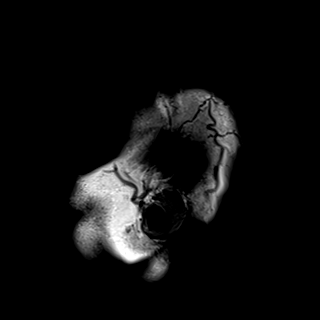

[Series 10: T2 · axial · 5.0mm · 0.72mm/px · z∈[-24,+113]mm · 2 of 25 slices shown (1 of 2)]
[im 1/25]
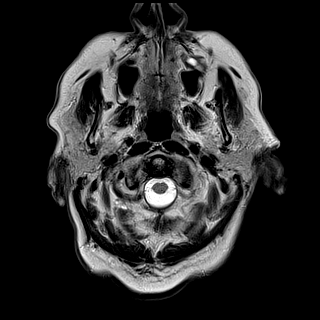
[im 25/25]
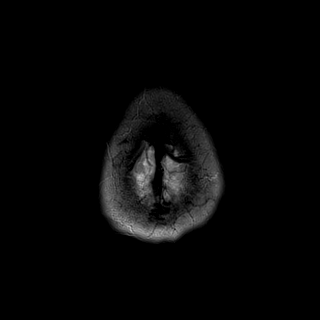

[Series 11: FLAIR · axial · 5.0mm · 0.45mm/px · z∈[-22,+115]mm · 2 of 25 slices shown]
[im 1/25]
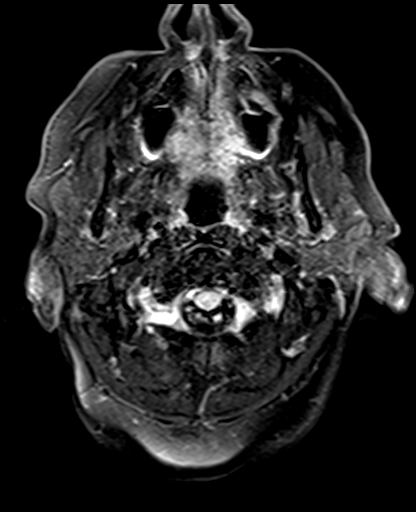
[im 25/25]
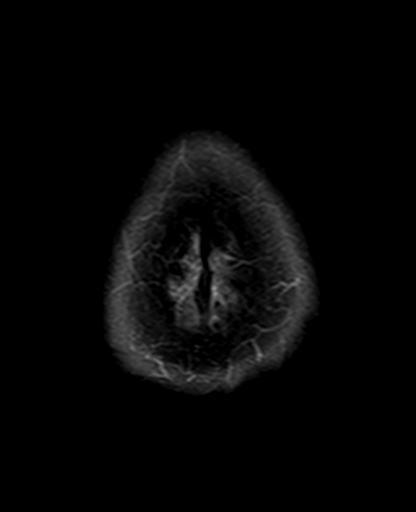

[Series 12: mag_images · axial · 3.0mm · 0.90mm/px · z∈[-32,+137]mm · 5 of 60 slices shown]
[im 1/60]
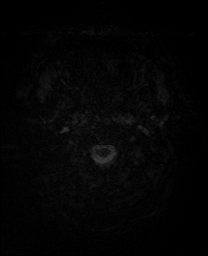
[im 15/60]
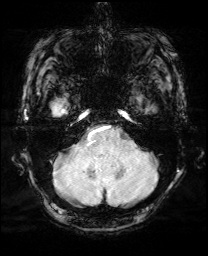
[im 30/60]
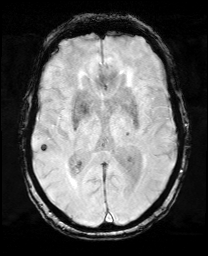
[im 45/60]
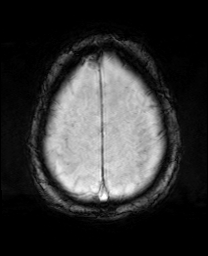
[im 60/60]
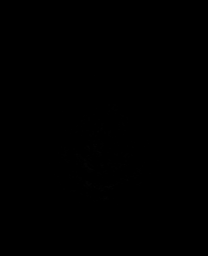

[Series 13: pha_images · axial · 3.0mm · 0.90mm/px · z∈[-32,+126]mm · 5 of 56 slices shown]
[im 1/56]
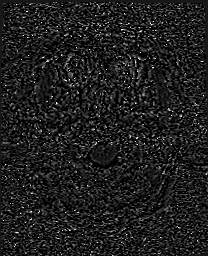
[im 14/56]
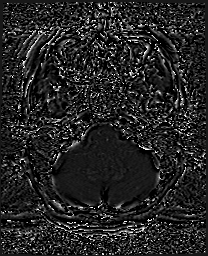
[im 28/56]
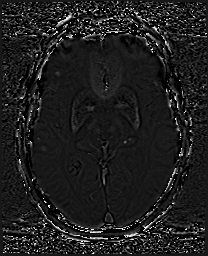
[im 42/56]
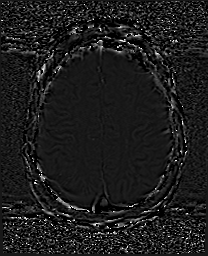
[im 56/56]
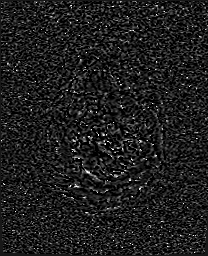

[Series 14: swi_images · axial · 3.0mm · 0.90mm/px · z∈[-32,+137]mm · 5 of 60 slices shown]
[im 1/60]
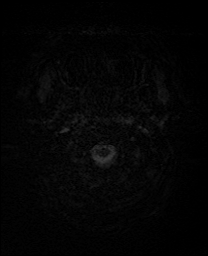
[im 15/60]
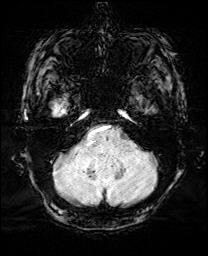
[im 30/60]
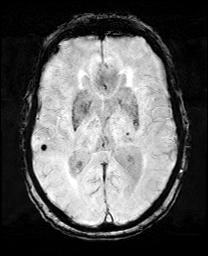
[im 45/60]
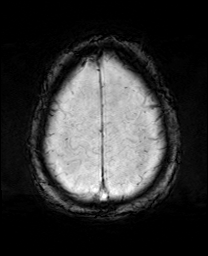
[im 60/60]
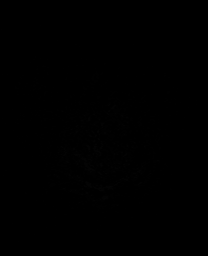

[Series 15: mip_images(sw) · axial · 24.0mm · 0.90mm/px · z∈[-22,+127]mm · 4 of 53 slices shown]
[im 1/53]
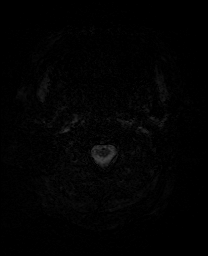
[im 18/53]
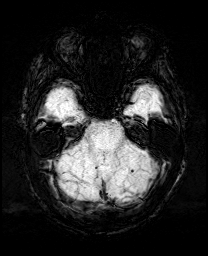
[im 35/53]
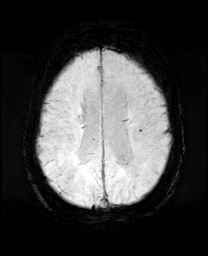
[im 53/53]
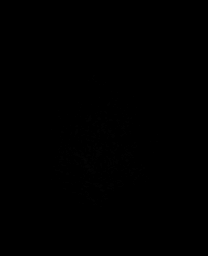

[Series 17: T2 · coronal · 5.0mm · 0.34mm/px · 2 of 29 slices shown (2 of 2)]
[im 1/29]
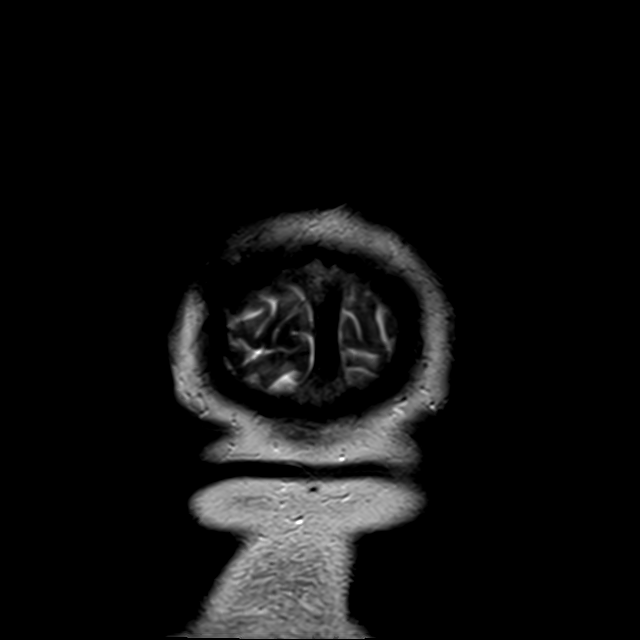
[im 29/29]
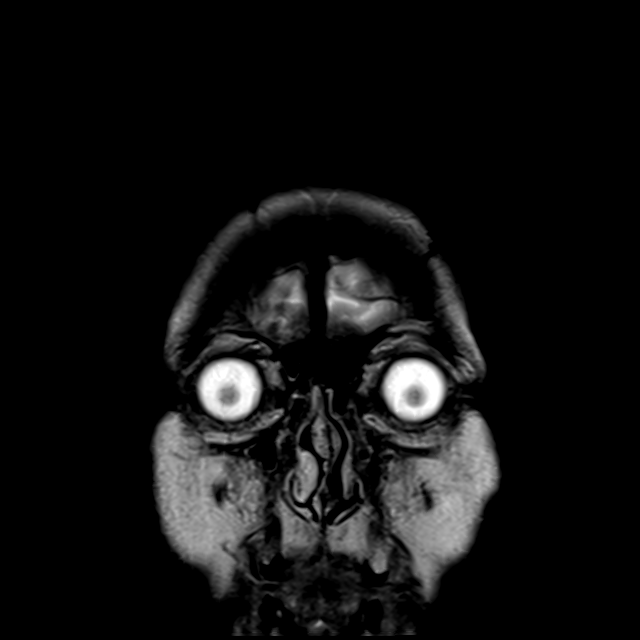

[43 of 48 positions shown; findings below may reference images not displayed]

FINDINGS: Brain: No acute infarct, hemorrhage, or mass lesion is present. The
lacunar infarct of the right centrum semi ovale is remote. There is
a remote lacunar infarct in the left lentiform nucleus as well.

Susceptibility weighted images demonstrate multiple punctate foci of
susceptibility bilaterally. No acute hemorrhage is present. Moderate
generalized white matter disease is present. Moderate
periventricular T2 signal changes are present. Scattered subcortical
T2 hyperintensities are noted as well. White matter changes extend
into the brainstem. Cerebellum is within normal limits. The
ventricles are of proportionate to the degree of atrophy.

Vascular: Flow is present in the major intracranial arteries.

Skull and upper cervical spine: The craniocervical junction is
normal. Upper cervical spine is within normal limits. Marrow signal
is unremarkable.

Sinuses/Orbits: The paranasal sinuses and mastoid air cells are
clear. The globes and orbits are within normal limits.
IMPRESSION: 1. No acute abnormality to explain syncopal episode.
2. Remote lacunar infarcts involving the right centrum semi ovale
and the left basal ganglia.
3. Moderate atrophy and white matter disease likely reflects the
sequela of chronic microvascular ischemia.
4. Multiple punctate foci of susceptibility consistent with remote
foci of punctate hemorrhage suggest an underlying vasculitis. No
acute hemorrhage is present.

## 2019-12-31 ENCOUNTER — Other Ambulatory Visit: Payer: Self-pay

## 2019-12-31 ENCOUNTER — Encounter (HOSPITAL_COMMUNITY): Payer: Self-pay

## 2019-12-31 ENCOUNTER — Emergency Department (HOSPITAL_COMMUNITY): Payer: Medicare Other

## 2019-12-31 ENCOUNTER — Emergency Department (HOSPITAL_COMMUNITY)
Admission: EM | Admit: 2019-12-31 | Discharge: 2020-01-01 | Disposition: A | Discharge status: Home / Self Care | Payer: Medicare Other | Attending: Emergency Medicine | Admitting: Emergency Medicine

## 2019-12-31 DIAGNOSIS — Z87891 Personal history of nicotine dependence: Secondary | ICD-10-CM | POA: Diagnosis not present

## 2019-12-31 DIAGNOSIS — I4891 Unspecified atrial fibrillation: Secondary | ICD-10-CM | POA: Insufficient documentation

## 2019-12-31 DIAGNOSIS — I130 Hypertensive heart and chronic kidney disease with heart failure and stage 1 through stage 4 chronic kidney disease, or unspecified chronic kidney disease: Secondary | ICD-10-CM | POA: Insufficient documentation

## 2019-12-31 DIAGNOSIS — Z7901 Long term (current) use of anticoagulants: Secondary | ICD-10-CM | POA: Insufficient documentation

## 2019-12-31 DIAGNOSIS — I5023 Acute on chronic systolic (congestive) heart failure: Secondary | ICD-10-CM | POA: Diagnosis not present

## 2019-12-31 DIAGNOSIS — E1122 Type 2 diabetes mellitus with diabetic chronic kidney disease: Secondary | ICD-10-CM | POA: Diagnosis not present

## 2019-12-31 DIAGNOSIS — N183 Chronic kidney disease, stage 3 unspecified: Secondary | ICD-10-CM | POA: Diagnosis not present

## 2019-12-31 DIAGNOSIS — Z7984 Long term (current) use of oral hypoglycemic drugs: Secondary | ICD-10-CM | POA: Insufficient documentation

## 2019-12-31 DIAGNOSIS — M25511 Pain in right shoulder: Secondary | ICD-10-CM | POA: Insufficient documentation

## 2019-12-31 MED ORDER — ACETAMINOPHEN 500 MG PO TABS
1000.0000 mg | ORAL_TABLET | Freq: Once | ORAL | Status: AC
  Administered 2019-12-31: 1000 mg via ORAL
  Filled 2019-12-31: qty 2

## 2019-12-31 MED ORDER — DICLOFENAC SODIUM 1 % EX GEL: 4.0000 g | Freq: Four times a day (QID) | CUTANEOUS | 0 refills | Status: DC

## 2019-12-31 MED ORDER — KETOROLAC TROMETHAMINE 30 MG/ML IJ SOLN
15.0000 mg | Freq: Once | INTRAMUSCULAR | Status: AC
  Administered 2019-12-31: 15 mg via INTRAMUSCULAR
  Filled 2019-12-31: qty 1

## 2019-12-31 NOTE — ED Provider Notes (Signed)
[facility] DEPT Provider Note   CSN: [id number] Arrival date & time: 12/31/19  2101     History Chief Complaint  Patient presents with  . Shoulder Pain    [name] is a 85 y.o. male.  85 yo M with a chief complaints of right arm pain.  Patient states that he had a mechanical fall few days ago and had pain to the right lateral aspect of the arm.  Worse when he tries to raise it above his head.  Experiencing some swelling down to the hand.  He denies other area of injury in the fall.  Denies head injury denies confusion denies vomiting.  The history is provided by the patient.  Shoulder Pain Location:  Arm Arm location:  R arm Injury: yes   Time since incident:  2 days Mechanism of injury: fall   Fall:    Fall occurred:  Walking   Impact surface:  Hard floor   Point of impact:  Outstretched arms   Entrapped after fall: no   Pain details:    Quality:  Aching   Radiates to:  Does not radiate   Severity:  Moderate   Onset quality:  Gradual   Duration:  2 days   Timing:  Constant   Progression:  Worsening Handedness:  Right-handed Dislocation: no   Prior injury to area:  No Relieved by:  Nothing Worsened by:  Bearing weight, exercise, movement, stress and stretching area Ineffective treatments:  NSAIDs Associated symptoms: no fever        Past Medical History:  Diagnosis Date  . A-fib ([place])    RVR-DCCV  . Abnormal PSA 01/17/2012   10.38/4/5/ PSA:2013=6.96/ PSA 07/08/2011=6.26/ PSA 2006=8.6  . 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 Blood transfusion without reported diagnosis    during vagotomy 1976  . Cancer [facility]) 2006   L-2 solitary metastatic lesion/no rad tx ,asymptomatic  . Chronic kidney disease    nephrolithiasis right kidney  . ED (erectile dysfunction)   . GERD (gastroesophageal reflux disease)    PUD S/P PARTIAL VAGOTOMY  . Gynecomastia 04/21/11   seen by Dr.Murray, no rad tx  . H/O impacted  cerumen    S/P ENT DR BATES  . History of echocardiogram    echo 4/16:  mild LVH, EF 55-60%, Gr 1 DD, no RWMA, mild AI, mild MR, mod LAE, mild TR  . Hx of cardiovascular stress test    Myoview 5/16:  EF 50%, no scar or ischemia, low risk  . Hydroureteronephrosis    right   . Hyperglycemia    WITHOUT DM2  . Hyperlipidemia   . Hypertension   . Insomnia   . Migraines    WITH AURA  . Nephrolithiasis   . Osteopenia   . Overweight   . Prostate cancer ([place]) 12/09/03 dx   Prostate ca/adenocarcinoma,gleason=3+3=6    pSA 4.6  . Prostate cancer ([place])    METASTATIC WITH L2 SCLEROTIC LESION ON LUPRON------DR. WOODRUFF  . RBBB (right bundle branch block)   . Rosacea   . Sinus bradycardia   . Sleep apnea    pt unaware  . Stroke [facility])    asymptomatic, found on head imaging  . Ulcer    peptic  . Use of leuprolide acetate (Lupron) 06/2005-11/2006   02/2008 degarelix 02/2008 x 3 doses    Patient Active Problem List   Diagnosis Date Noted  . Peripheral edema   . Acute systolic  CHF (congestive heart failure) ([place]) 04/24/2019  . Weakness generalized 04/22/2019  . Chronic systolic CHF (congestive heart failure) ([place]) 10/30/2018  . Acute renal failure superimposed on stage 3 chronic kidney disease ([place]) 10/30/2018  . Syncope 10/30/2018  . Acute on chronic combined systolic and diastolic CHF (congestive heart failure) ([place]) 08/10/2018  . Mitral regurgitation 08/10/2018  . Atrial fibrillation ([place]) 06/14/2018  . PAH (pulmonary artery hypertension) ([place]) 06/13/2018  . HTN (hypertension) 06/13/2018  . Acute on chronic kidney failure ([place]) 07/20/2015  . HLD (hyperlipidemia) 07/20/2015  . Stroke (cerebrum) ([place]) 07/20/2015  . Elevated troponin 07/20/2015  . Diabetes mellitus without complication ([place]) 20/94/7096  . Nonsustained paroxysmal ventricular tachycardia ([place]) 12/08/2014  . RBBB 10/22/2014  . Bradycardia 10/22/2014  . PVC's (premature ventricular contractions) 10/22/2014  . CKD (chronic  kidney disease), stage III 10/20/2014  . Use of leuprolide acetate (Lupron)   . Hydroureteronephrosis   . Blood transfusion without reported diagnosis   . Blurry vision   . Old lacunar strokes by MRI   . Hx of Prostate cancer   . Cancer ([place])   . Abnormal PSA 01/17/2012    Past Surgical History:  Procedure Laterality Date  . CARDIOVERSION N/A 06/19/2018   Procedure: CARDIOVERSION;  Surgeon: Larey Dresser, MD;  Location: [facility] ENDOSCOPY;  Service: Cardiovascular;  Laterality: N/A;  . PROSTATE BIOPSY  12/09/03   adenocarcinoma,glerason: 3+3=6  . RIGHT/LEFT HEART CATH AND CORONARY ANGIOGRAPHY N/A 04/25/2019   Procedure: RIGHT/LEFT HEART CATH AND CORONARY ANGIOGRAPHY;  Surgeon: Leonie Man, MD;  Location: [facility] CV LAB;  Service: Cardiovascular;  Laterality: N/A;  . TEE WITHOUT CARDIOVERSION N/A 06/19/2018   Procedure: TRANSESOPHAGEAL ECHOCARDIOGRAM (TEE);  Surgeon: Larey Dresser, MD;  Location: [facility] ENDOSCOPY;  Service: Cardiovascular;  Laterality: N/A;  . VAGOTOMY  1978   partial       Family History  Problem Relation Age of Onset  . Liver cancer Father   . Kidney Stones Father   . Colon cancer Neg Hx   . Stroke Neg Hx     Social History   Tobacco Use  . Smoking status: Former Smoker    Types: Pipe    Quit date: 07/05/1984    Years since quitting: 35.5  . Smokeless tobacco: Never Used  Vaping Use  . Vaping Use: Never used  Substance Use Topics  . Alcohol use: No  . Drug use: No    Home Medications Prior to Admission medications   Medication Sig Start Date End Date Taking? Authorizing Provider  atorvastatin (LIPITOR) 20 MG tablet Take 1 tablet (20 mg total) by mouth daily. 04/29/18 04/21/20  Arrien, Jimmy Picket, MD  CALCIUM CITRATE PO Take 600 mg by mouth 2 (two) times daily.    [provider]  cholecalciferol (VITAMIN D3) 25 MCG (1000 UT) tablet Take 1,000 Units by mouth every morning.    [provider]  diclofenac Sodium (VOLTAREN)  1 % GEL Apply 4 g topically 4 (four) times daily. 12/31/19   Deno Etienne, DO  furosemide (LASIX) 40 MG tablet Take 40 mg by mouth daily.  05/01/19   [provider]  GLUCOSAMINE-CHONDROIT-CALCIUM PO Take 1 capsule by mouth daily.    [provider]  Nyoka Cowden Coffee Bean-Yerba Mate (GREEN COFFEE BEAN EXTRACT PO) Take 1 capsule by mouth daily.    [provider]  metFORMIN (GLUCOPHAGE) 500 MG tablet Take 1 tablet (500 mg total) by mouth 2 (two) times daily with a meal. 04/29/18 08/10/19  Arrien,  Jimmy Picket, MD  metoprolol succinate (TOPROL-XL) 50 MG 24 hr tablet Take 50 mg by mouth daily. Take with or immediately following a meal.    [provider]  sacubitril-valsartan (ENTRESTO) 97-103 MG Take 1 tablet by mouth 2 (two) times daily. 08/10/19 08/04/20  Sherren Mocha, MD  Thiamine HCl (VITAMIN B-1) 250 MG tablet Take 250 mg by mouth daily.    [provider]  vitamin B-12 (CYANOCOBALAMIN) 100 MCG tablet Take 100 mcg by mouth daily.    [provider]  XARELTO 20 MG TABS tablet Take 20 mg by mouth daily. 07/23/19   [provider]    Allergies    Aspirin  Review of Systems   Review of Systems  Constitutional: Negative for chills and fever.  HENT: Negative for congestion and facial swelling.   Eyes: Negative for discharge and visual disturbance.  Respiratory: Negative for shortness of breath.   Cardiovascular: Negative for chest pain and palpitations.  Gastrointestinal: Negative for abdominal pain, diarrhea and vomiting.  Musculoskeletal: Negative for arthralgias and myalgias.  Skin: Negative for color change and rash.  Neurological: Negative for tremors, syncope and headaches.  Psychiatric/Behavioral: Negative for confusion and dysphoric mood.    Physical Exam Updated Vital Signs BP (!) 148/94 (BP Location: Left Arm)   Pulse 73   Temp 97.7 F (36.5 C) (Oral)   Resp 16   Ht 6' (1.829 m)   Wt 108.9 kg   SpO2 97%   BMI 32.55  kg/m   Physical Exam Vitals and nursing note reviewed.  Constitutional:      Appearance: He is well-developed.  HENT:     Head: Normocephalic and atraumatic.  Eyes:     Pupils: Pupils are equal, round, and reactive to light.  Neck:     Vascular: No JVD.  Cardiovascular:     Rate and Rhythm: Normal rate and regular rhythm.     Heart sounds: No murmur heard.  No friction rub. No gallop.   Pulmonary:     Effort: No respiratory distress.     Breath sounds: No wheezing.  Abdominal:     General: There is no distension.     Tenderness: There is no abdominal tenderness. There is no guarding or rebound.  Musculoskeletal:        General: Tenderness present. Normal range of motion.     Cervical back: Normal range of motion and neck supple.     Comments: Tenderness and swelling to the right arm.  Limited forward flexion to the RUE to about 90 degrees.  Swelling to the elbow, full ROM without pain.  PMS intact distally.   Skin:    Coloration: Skin is not pale.     Findings: No rash.  Neurological:     Mental Status: He is alert and oriented to person, place, and time.  Psychiatric:        Behavior: Behavior normal.     ED Results / Procedures / Treatments   Labs (all labs ordered are listed, but only abnormal results are displayed) Labs Reviewed - No data to display  EKG None  Radiology DG Shoulder Right  Result Date: 12/31/2019 CLINICAL DATA:  Shoulder pain. EXAM: RIGHT SHOULDER - 2+ VIEW COMPARISON:  None. FINDINGS: There are mild degenerative changes of the right glenohumeral joint and right AC joint. There is no acute displaced fracture. No dislocation. IMPRESSION: Mild degenerative changes of the right shoulder. Electronically Signed   By: Constance Holster M.D.   On: 12/31/2019  21:35    Procedures Procedures (including critical care time)  Medications Ordered in ED Medications  acetaminophen (TYLENOL) tablet 1,000 mg (1,000 mg Oral Given 12/31/19 2339)  ketorolac  (TORADOL) 30 MG/ML injection 15 mg (15 mg Intramuscular Given 12/31/19 2341)    ED Course  I have reviewed the triage vital signs and the nursing notes.  Pertinent labs & imaging results that were available during my care of the patient were reviewed by me and considered in my medical decision making (see chart for details).    MDM Rules/Calculators/A&P                          85 yo M with a chief complaints of right shoulder pain after a mechanical fall couple days ago.  Patient with pain with range of motion.  Plain film viewed by me without fracture.  Limited exam secondary to tenderness.  Offered plain film of the elbow and he is declining.  Will place in a sling for comfort.  Tylenol and diclofenac gel.  PCP follow-up.  12:21 AM:  I have discussed the diagnosis/risks/treatment options with the patient and believe the pt to be eligible for discharge home to follow-up with PCP. We also discussed returning to the ED immediately if new or worsening sx occur. We discussed the sx which are most concerning (e.g., sudden worsening pain, fever, inability to tolerate by mouth) that necessitate immediate return. Medications administered to the patient during their visit and any new prescriptions provided to the patient are listed below.  Medications given during this visit Medications  acetaminophen (TYLENOL) tablet 1,000 mg (1,000 mg Oral Given 12/31/19 2339)  ketorolac (TORADOL) 30 MG/ML injection 15 mg (15 mg Intramuscular Given 12/31/19 2341)     The patient appears reasonably screen and/or stabilized for discharge and I doubt any other medical condition or other [facility] requiring further screening, evaluation, or treatment in the ED at this time prior to discharge.     Final Clinical Impression(s) / ED Diagnoses Final diagnoses:  Acute pain of right shoulder    Rx / DC Orders ED Discharge Orders         Ordered    diclofenac Sodium (VOLTAREN) 1 % GEL  4 times daily     Discontinue   Reprint     12/31/19 2319           Deno Etienne, DO 01/01/20 0021

## 2019-12-31 NOTE — ED Triage Notes (Signed)
Right sided shoulder pain for 1 week. Limited rom.

## 2019-12-31 NOTE — Discharge Instructions (Signed)
Take the gel as prescribed. Also take tylenol 1000mg (2 extra strength) four times a day.   Return for worsening pain, fever.  Follow up with your doctor in the office.  Your sling is for your comfort.  You do need to take it out of the sling and do range of motion exercises at least 4 times a day.

## 2020-04-04 ENCOUNTER — Other Ambulatory Visit: Payer: Self-pay | Admitting: Internal Medicine

## 2020-04-04 DIAGNOSIS — R7989 Other specified abnormal findings of blood chemistry: Secondary | ICD-10-CM

## 2020-04-16 ENCOUNTER — Encounter: Payer: Self-pay | Admitting: Physician Assistant

## 2020-05-08 ENCOUNTER — Encounter: Payer: Self-pay | Admitting: Physician Assistant

## 2020-05-08 ENCOUNTER — Ambulatory Visit (INDEPENDENT_AMBULATORY_CARE_PROVIDER_SITE_OTHER): Payer: Medicare Other | Admitting: Physician Assistant

## 2020-05-08 VITALS — BP 140/80 | HR 98 | Ht 72.0 in | Wt 240.2 lb

## 2020-05-08 DIAGNOSIS — Z7901 Long term (current) use of anticoagulants: Secondary | ICD-10-CM | POA: Diagnosis not present

## 2020-05-08 DIAGNOSIS — R195 Other fecal abnormalities: Secondary | ICD-10-CM

## 2020-05-08 MED ORDER — SUTAB 1479-225-188 MG PO TABS: 1.0000 | ORAL_TABLET | Freq: Once | ORAL | 0 refills | Status: AC

## 2020-05-08 NOTE — Progress Notes (Signed)
Chief Complaint: Positive Hemoccult test  HPI:    [name] is an 85 year old male from [place], previously known to Dr. Deatra Ina, with a past medical history as listed below including A. fib on Xarelto, who was referred to me by [name], MD for a complaint of positive Hemoccult test.      04/02/2014 colonoscopy with a sessile polyp measuring 3 mm in the sigmoid colon, moderate diverticulosis in the sigmoid colon otherwise normal.  Pathology showed tubular adenoma.  No repeat recommended due to age.    03/06/2020 CMP normal, CBC normal.    03/15/2019 Hemosure positive.    03/31/2028 Hemoccult positive x3.    Today, the patient presents to clinic and tells me that he has seen some bright red blood on the toilet paper when wiping off and on and for a consistent 1 to 2 weeks recently, but this is only a little bit.  Denies any constipation or diarrhea.  Tells me that he really has no GI issues.  His biggest issue right now is his balance when walking.  Tells me that he is not taking his Xarelto currently because this was giving him nosebleeds.    Patient is originally from [place] and moved over here for a girl, now has no family in town other than 2 small grandchildren.      Denies fever, chills, weight loss, change in bowel habits or abdominal pain.  Past Medical History:  Diagnosis Date  . A-fib ([place])    RVR-DCCV  . Abnormal PSA 01/17/2012   10.38/4/5/ PSA:2013=6.96/ PSA 07/08/2011=6.26/ PSA 2006=8.6  . 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 Blood transfusion without reported diagnosis    during vagotomy 1976  . Cancer [facility]) 2006   L-2 solitary metastatic lesion/no rad tx ,asymptomatic  . Chronic kidney disease    nephrolithiasis right kidney  . ED (erectile dysfunction)   . GERD (gastroesophageal reflux disease)    PUD S/P PARTIAL VAGOTOMY  . Gynecomastia 04/21/11   seen by Dr.Murray, no rad tx  . H/O impacted cerumen    S/P ENT DR BATES  . History of echocardiogram     echo 4/16:  mild LVH, EF 55-60%, Gr 1 DD, no RWMA, mild AI, mild MR, mod LAE, mild TR  . Hx of cardiovascular stress test    Myoview 5/16:  EF 50%, no scar or ischemia, low risk  . Hydroureteronephrosis    right   . Hyperglycemia    WITHOUT DM2  . Hyperlipidemia   . Hypertension   . Insomnia   . Migraines    WITH AURA  . Nephrolithiasis   . Osteopenia   . Overweight   . Prostate cancer ([place]) 12/09/03 dx   Prostate ca/adenocarcinoma,gleason=3+3=6    pSA 4.6  . Prostate cancer ([place])    METASTATIC WITH L2 SCLEROTIC LESION ON LUPRON------DR. WOODRUFF  . RBBB (right bundle branch block)   . Rosacea   . Sinus bradycardia   . Sleep apnea    pt unaware  . Stroke [facility])    asymptomatic, found on head imaging  . Ulcer    peptic  . Use of leuprolide acetate (Lupron) 06/2005-11/2006   02/2008 degarelix 02/2008 x 3 doses    Past Surgical History:  Procedure Laterality Date  . CARDIOVERSION N/A 06/19/2018   Procedure: CARDIOVERSION;  Surgeon: Larey Dresser, MD;  Location: [facility] ENDOSCOPY;  Service: Cardiovascular;  Laterality: N/A;  . PROSTATE BIOPSY  12/09/03  adenocarcinoma,glerason: 3+3=6  . RIGHT/LEFT HEART CATH AND CORONARY ANGIOGRAPHY N/A 04/25/2019   Procedure: RIGHT/LEFT HEART CATH AND CORONARY ANGIOGRAPHY;  Surgeon: Leonie Man, MD;  Location: [facility] CV LAB;  Service: Cardiovascular;  Laterality: N/A;  . TEE WITHOUT CARDIOVERSION N/A 06/19/2018   Procedure: TRANSESOPHAGEAL ECHOCARDIOGRAM (TEE);  Surgeon: Larey Dresser, MD;  Location: [facility] ENDOSCOPY;  Service: Cardiovascular;  Laterality: N/A;  . VAGOTOMY  1978   partial    Current Outpatient Medications  Medication Sig Dispense Refill  . atorvastatin (LIPITOR) 20 MG tablet Take 1 tablet (20 mg total) by mouth daily. 30 tablet 0  . CALCIUM CITRATE PO Take 600 mg by mouth 2 (two) times daily.    . cholecalciferol (VITAMIN D3) 25 MCG (1000 UT) tablet Take 1,000 Units by mouth every morning.    . diclofenac Sodium  (VOLTAREN) 1 % GEL Apply 4 g topically 4 (four) times daily. 100 g 0  . furosemide (LASIX) 40 MG tablet Take 40 mg by mouth daily.     Marland Kitchen GLUCOSAMINE-CHONDROIT-CALCIUM PO Take 1 capsule by mouth daily.    Nyoka Cowden Coffee Bean-Yerba Mate (GREEN COFFEE BEAN EXTRACT PO) Take 1 capsule by mouth daily.    . metFORMIN (GLUCOPHAGE) 500 MG tablet Take 1 tablet (500 mg total) by mouth 2 (two) times daily with a meal. 60 tablet 0  . metoprolol succinate (TOPROL-XL) 50 MG 24 hr tablet Take 50 mg by mouth daily. Take with or immediately following a meal.    . sacubitril-valsartan (ENTRESTO) 97-103 MG Take 1 tablet by mouth 2 (two) times daily. 180 tablet 3  . Thiamine HCl (VITAMIN B-1) 250 MG tablet Take 250 mg by mouth daily.    . vitamin B-12 (CYANOCOBALAMIN) 100 MCG tablet Take 100 mcg by mouth daily.    Alveda Reasons 20 MG TABS tablet Take 20 mg by mouth daily.     No current facility-administered medications for this visit.    Allergies as of 05/08/2020 - Review Complete 12/31/2019  Allergen Reaction Noted  . Aspirin Other (See Comments) 06/13/2018    Family History  Problem Relation Age of Onset  . Liver cancer Father   . Kidney Stones Father   . Colon cancer Neg Hx   . Stroke Neg Hx     Social History   Socioeconomic History  . Marital status: Widowed    Spouse name: Not on file  . Number of children: 2  . Years of education: Not on file  . Highest education level: Not on file  Occupational History  . 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 Smoking status: Former Smoker    Types: Pipe    Quit date: 07/05/1984    Years since quitting: 35.8  . Smokeless tobacco: Never Used  Vaping Use  . Vaping Use: Never used  Substance and Sexual Activity  . Alcohol use: No  . Drug use: No  . Sexual activity: Never  Other Topics Concern  . Not on file  Social History Narrative   Pt lives in [place], alone.  Widower.   Retired Animator.   Social  Determinants of Health   Financial Resource Strain:   . Difficulty of Paying Living Expenses: Not on file  Food Insecurity:   . Worried About Charity fundraiser in the Last Year: Not on file  . Ran Out of Food in the Last Year: Not on file  Transportation Needs:   .  Lack of Transportation (Medical): Not on file  . Lack of Transportation (Non-Medical): Not on file  Physical Activity:   . Days of Exercise per Week: Not on file  . Minutes of Exercise per Session: Not on file  Stress:   . Feeling of Stress : Not on file  Social Connections:   . Frequency of Communication with Friends and Family: Not on file  . Frequency of Social Gatherings with Friends and Family: Not on file  . Attends Religious Services: Not on file  . Active Member of Clubs or Organizations: Not on file  . Attends Archivist Meetings: Not on file  . Marital Status: Not on file  Intimate Partner Violence:   . Fear of Current or Ex-Partner: Not on file  . Emotionally Abused: Not on file  . Physically Abused: Not on file  . Sexually Abused: Not on file    Review of Systems:    Constitutional: No weight loss, fever or chills Skin: No rash  Cardiovascular: No chest pain   Respiratory: No SOB Gastrointestinal: See HPI and otherwise negative Genitourinary: No dysuria Neurological: No headache, dizziness or syncope Musculoskeletal: No new muscle or joint pain Hematologic: No bruising Psychiatric: No history of depression or anxiety   Physical Exam:  Vital signs: BP 140/80   Pulse 98   Ht 6' (1.829 m)   Wt 240 lb 3.2 oz (109 kg)   BMI 32.58 kg/m   Constitutional:   Pleasant elderly Caucasian male appears to be in NAD, Well developed, Well nourished, alert and cooperative Head:  Normocephalic and atraumatic. Eyes:   PEERL, EOMI. No icterus. Conjunctiva pink. Ears:  Normal auditory acuity. Neck:  Supple Throat: Oral cavity and pharynx without inflammation, swelling or lesion.  Respiratory:  Respirations even and unlabored. Lungs clear to auscultation bilaterally.   No wheezes, crackles, or rhonchi.  Cardiovascular: Normal S1, S2. No MRG. Regular rate and rhythm. No peripheral edema, cyanosis or pallor.  Gastrointestinal:  Soft, nondistended, nontender. No rebound or guarding. Normal bowel sounds. No appreciable masses or hepatomegaly. Rectal:  Not performed.  Msk:  Symmetrical without gross deformities. Without edema, no deformity or joint abnormality.  Neurologic:  Alert and  oriented x4;  grossly normal neurologically.  Skin:   Dry and intact without significant lesions or rashes. Psychiatric: Demonstrates good judgement and reason without abnormal affect or behaviors.  See HPI for recent labs.  Assessment: 1.  Hemoccult positive: Recent Hemoccult and hematuria testing positive, last colonoscopy in 2015 with an adenomatous polyp; consider hemorrhoids versus other 2.  Chronic anticoagulation for A. fib: Patient is supposed to be taking Xarelto he tells me he stopped it on his own day nosebleeds  Plan: 1.  Patient was scheduled for diagnostic colonoscopy in the [place] with Dr. Tarri Glenn.  Did discuss risks, benefits, limitations and alternatives and patient agrees to proceed.  He has not had a Covid vaccine and will be Covid tested 2 days prior to time of procedure. 2.  Patient was advised to stop his Xarelto if he is taking it (he is pretty sure he isn't).  We will discuss with Dr. [name] and ensure that him stopping his Xarelto is acceptable for him. 3.  Provided the patient with information on a driving service that he can get to this appointment. 4.  Patient to follow in clinic per recommendations after time of procedure with Dr. Tarri Glenn.  Ellouise Newer, PA-C [facility] 05/08/2020, 1:33 PM  Cc: [name], MD

## 2020-05-08 NOTE — Progress Notes (Signed)
Reviewed and agree with management plans. ? ?Calvin Searles L. Verlie Hellenbrand, MD, MPH  ?

## 2020-05-08 NOTE — Patient Instructions (Signed)
If you are age 83 or older, your body mass index should be between 23-30. Your Body mass index is 32.58 kg/m. If this is out of the aforementioned range listed, please consider follow up with your Primary Care Provider.  If you are age 19 or younger, your body mass index should be between 19-25. Your Body mass index is 32.58 kg/m. If this is out of the aformentioned range listed, please consider follow up with your Primary Care Provider.   You have been scheduled for a colonoscopy. Please follow written instructions given to you at your visit today.  Please pick up your prep supplies at the pharmacy within the next 1-3 days. If you use inhalers (even only as needed), please bring them with you on the day of your procedure.  Due to recent changes in healthcare laws, you may see the results of your imaging and laboratory studies on MyChart before your provider has had a chance to review them.  We understand that in some cases there may be results that are confusing or concerning to you. Not all laboratory results come back in the same time frame and the provider may be waiting for multiple results in order to interpret others.  Please give [place] 48 hours in order for your provider to thoroughly review all the results before contacting the office for clarification of your results.

## 2020-05-09 ENCOUNTER — Telehealth: Payer: Self-pay | Admitting: *Deleted

## 2020-05-09 NOTE — Telephone Encounter (Signed)
Faxed clearance request to Dr. [name] office.

## 2020-05-11 NOTE — Progress Notes (Signed)
Virtual Visit via Telephone Note   This visit type was conducted due to national recommendations for restrictions regarding the COVID-19 Pandemic (e.g. social distancing) in an effort to limit this patient's exposure and mitigate transmission in our community.  Due to his co-morbid illnesses, this patient is at least at moderate risk for complications without adequate follow up.  This format is felt to be most appropriate for this patient at this time.  The patient did not have access to video technology/had technical difficulties with video requiring transitioning to audio format only (telephone).  All issues noted in this document were discussed and addressed.  No physical exam could be performed with this format.  Please refer to the patient's chart for his  consent to telehealth for [name].    Date:  05/12/2020   ID:  [name], DOB 1935-01-09, MRN [id number] The patient was identified using 2 identifiers.  Patient Location: Home Provider Location: Office/Clinic  PCP:  Calvin Baton, MD  Cardiologist:  Calvin Mocha, MD   Electrophysiologist:  Calvin Grayer, MD   Evaluation Performed:  Follow-Up Visit  Chief Complaint:  Follow-up (CHF, AFib, CAD)    Patient Profile: [name] is a 85 y.o. male with:  Hx of syncope  Vasovagal (Admx 10/2018)  NSVT   Heart failure with reduced ejection fraction   Hx of tachy mediated CM in 12/19 (AF w RVR) >> EF returned to normal in NSR  Non-ischemic cardiomyopathy   Echocardiogram 04/2019: EF 35-40  Cath w mod non-obs CAD  Coronary artery disease   Cath 04/2019: mod non-obstructive CAD   Persistent atrial fibrillation  CHA2DS2-VASc Score = 8 [CHF History: 1, HTN History: 1, Diabetes History: 1, Stroke History: 2, Vascular Disease History: 1, Age Score: 2, Gender Score: 0].  Therefore, the patient's annual risk of stroke is 10.8 %.      Admx 12/19 >> Amio, Apixaban >> s/p TEE-DCCV   Mitral regurgitation   Pulmonary  HTN   Diabetes mellitus   Hypertension   Hyperlipidemia   Chronic kidney disease   Prior CVA   OSA   Prostate CA   Dementia   RBBB   Prior CV studies:  Cardiac catheterization 04/25/2019 LM ost 20 LAD calcified, dist 55; D2 55 LCx ost 30, prox 25, mid 35; OM1 60 RCA irregs  Echocardiogram 04/23/2019 EF 35-40, diff HK worse in inf wall, normla RVSF, mild to mod MR, mod TR, mild AI, mild to mod AV sclerosis (no AS), mild PI, RVSP 52.6  Echo 09/07/2018 EF 50-55, moderate LVH, normal diastolic function, moderate LAE, moderate MAC, moderate MR, mild to moderate AI  TEE 06/19/2018 - 1. No LA thrombus, may proceed to DCCV. 2. Severe LV dilation with EF 25%, diffuse hypokinesis. 3. Mild RV dilation with moderate systolic dysfunction. 4. Severe MR with restriction of posterior leaflet and evidence for a ruptured chord.  Echocardiogram 06/17/2018 Mild focal basal septal hypertrophy, EF 20, diffuse HK with severe inferior HK, mild AI, mild MAC, severe MR, mild LAE, moderate to severe RVE, mild RAE, moderate TR, PASP 58  Echocardiogram 04/29/2018 Moderate LVH, EF 40-98, grade 2 diastolic dysfunction, mild AI, moderate MR, severe LAE, mild RAE, PASP 75  Myoview 11/27/2014 EF 50, no ischemia  Event monitor 10/24/2014 Sinus rhythm, PVCs, NSVT  Echocardiogram 10/22/2014 Mild concentric LVH, EF 55-60, normal wall motion, grade 1 diastolic dysfunction, mild AI, mild MR, moderate LAE, mild TR  Carotid US 10/22/2014 Bilateral ICA 1-39   History of Present Illness:  [name] was last seen by [name] in 08/2019.  His Entresto dose was increased.  He was not felt to be able to tolerate Spironolactone due to underlying chronic kidney disease.  Of note, he recently saw GI for heme positive stools.  He is seen for follow up.   He has not had chest discomfort, significant shortness of breath, orthopnea, leg swelling or syncope.  He has dyspnea with more moderate to  extreme activities.  He is off of most of his medications.  He is no longer on rivaroxaban.  He notes a significant history of epistaxis.  He tells me he did see an Calvin in the past without significant improvement in his symptoms.  He notes that he has been off of rivaroxaban for about a year.  He is also no longer on metoprolol succinate.  It is unclear why this was stopped.  In addition, he is no longer on atorvastatin.  He is not clear why this was stopped as well.  He is being set up for a colonoscopy with gastroenterology.  It looks like gastroenterology recommended stopping anticoagulation.  Past Medical History:  Diagnosis Date  . A-fib ([name])    RVR-DCCV  . Abnormal PSA 01/17/2012   10.38/4/5/ PSA:2013=6.96/ PSA 07/08/2011=6.26/ PSA 2006=8.6  . 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 Blood transfusion without reported diagnosis    during vagotomy 1976  . Cancer [facility]) 2006   L-2 solitary metastatic lesion/no rad tx ,asymptomatic  . Chronic kidney disease    nephrolithiasis right kidney  . ED (erectile dysfunction)   . GERD (gastroesophageal reflux disease)    PUD S/P PARTIAL VAGOTOMY  . Gynecomastia 04/21/11   seen by [name], no rad tx  . H/O impacted cerumen    S/P [name]  . History of echocardiogram    echo 4/16:  mild LVH, EF 55-60%, Gr 1 DD, no RWMA, mild AI, mild MR, mod LAE, mild TR  . Hx of cardiovascular stress test    Myoview 5/16:  EF 50%, no scar or ischemia, low risk  . Hydroureteronephrosis    right   . Hyperglycemia    WITHOUT DM2  . Hyperlipidemia   . Hypertension   . Insomnia   . Migraines    WITH AURA  . Nephrolithiasis   . Osteopenia   . Overweight   . Prostate cancer ([place]) 12/09/03 dx   Prostate ca/adenocarcinoma,gleason=3+3=6    pSA 4.6  . Prostate cancer ([place])    METASTATIC WITH L2 SCLEROTIC LESION ON LUPRON------[name]  . RBBB (right bundle branch block)   . Rosacea   . Sinus bradycardia   . Sleep apnea    pt  unaware  . Stroke [facility])    asymptomatic, found on head imaging  . Ulcer    peptic  . Use of leuprolide acetate (Lupron) 06/2005-11/2006   02/2008 degarelix 02/2008 x 3 doses   Past Surgical History:  Procedure Laterality Date  . CARDIOVERSION N/A 06/19/2018   Procedure: CARDIOVERSION;  Surgeon: Larey Dresser, MD;  Location: [facility] ENDOSCOPY;  Service: Cardiovascular;  Laterality: N/A;  . PROSTATE BIOPSY  12/09/03   adenocarcinoma,glerason: 3+3=6  . RIGHT/LEFT HEART CATH AND CORONARY ANGIOGRAPHY N/A 04/25/2019   Procedure: RIGHT/LEFT HEART CATH AND CORONARY ANGIOGRAPHY;  Surgeon: Leonie Man, MD;  Location: [place] CV LAB;  Service: Cardiovascular;  Laterality: N/A;  . TEE WITHOUT CARDIOVERSION N/A 06/19/2018   Procedure: TRANSESOPHAGEAL ECHOCARDIOGRAM (TEE);  Surgeon:  Larey Dresser, MD;  Location: [facility] ENDOSCOPY;  Service: Cardiovascular;  Laterality: N/A;  . VAGOTOMY  1978   partial     Current Meds  Medication Sig  . cholecalciferol (VITAMIN D3) 25 MCG (1000 UT) tablet Take 1,000 Units by mouth every morning.  . furosemide (LASIX) 40 MG tablet Take 40 mg by mouth daily.   Marland Kitchen GLUCOSAMINE-CHONDROIT-CALCIUM PO Take 1 capsule by mouth daily.  . metFORMIN (GLUCOPHAGE) 500 MG tablet Take 1 tablet (500 mg total) by mouth 2 (two) times daily with a meal.  . Multiple Vitamin (MULTIVITAMIN) tablet Take 1 tablet by mouth daily.  . sacubitril-valsartan (ENTRESTO) 49-51 MG Take 1 tablet by mouth 2 (two) times daily.  . vitamin B-12 (CYANOCOBALAMIN) 100 MCG tablet Take 100 mcg by mouth daily.  . vitamin E (VITAMIN E) 180 MG (400 UNITS) capsule Take 400 Units by mouth daily.     Allergies:   Aspirin   Social History   Tobacco Use  . Smoking status: Former Smoker    Types: Pipe    Quit date: 07/05/1984    Years since quitting: 35.8  . Smokeless tobacco: Never Used  Vaping Use  . Vaping Use: Never used  Substance Use Topics  . Alcohol use: No  . Drug use: No     Family Hx: The  patient's family history includes Kidney Stones in his father; Liver cancer in his father. There is no history of Colon cancer or Stroke.  ROS:   Please see the history of present illness.    He notes difficulty with balance.   Labs/Other Tests and Data Reviewed:    EKG:  No ECG reviewed.  Recent Labs: 05/29/2019: BUN 24; Creatinine, Ser 1.21; Potassium 4.1; Sodium 144   Recent Lipid Panel Lab Results  Component Value Date/Time   CHOL 129 04/22/2019 05:59 PM   TRIG 118 04/22/2019 05:59 PM   HDL 34 (L) 04/22/2019 05:59 PM   CHOLHDL 3.8 04/22/2019 05:59 PM   LDLCALC 71 04/22/2019 05:59 PM   Labs from [place] personally reviewed and interpreted 10/08/2019: Hemoglobin 14, creatinine 1.1   Wt Readings from Last 3 Encounters:  05/12/20 240 lb (108.9 kg)  05/08/20 240 lb 3.2 oz (109 kg)  12/31/19 240 lb (108.9 kg)     Risk Assessment/Calculations:     CHA2DS2-VASc Score = 8  This indicates a 10.8% annual risk of stroke. The patient's score is based upon: CHF History: 1 HTN History: 1 Diabetes History: 1 Stroke History: 2 Vascular Disease History: 1 Age Score: 2 Gender Score: 0     Objective:    Vital Signs:  BP 140/80   Ht 6' (1.829 m)   Wt 240 lb (108.9 kg)   BMI 32.55 kg/m    VITAL SIGNS:  reviewed GEN:  no acute distress PSYCH:  normal affect  ASSESSMENT & PLAN:    1. HFrEF (heart failure with reduced ejection fraction) (HCC) Nonischemic cardiomyopathy.  EF 35-40% by echocardiogram October 2020.  NYHA II.  Volume status seems to be stable.  He is currently only on Entresto.  He was to increase his dose after last visit but never did so.  He is off of metoprolol succinate for unclear reasons.  I had a long discussion with the patient today regarding all of his medications and the importance of these drugs.  I have recommended that we resume metoprolol succinate 25 mg daily.  Arrange follow-up in 6 to 8 weeks, in person.  2. Paroxysmal atrial fibrillation  (  [place]) CHA2DS2-VASc Score = 8 [CHF History: 1, HTN History: 1, Diabetes History: 1, Stroke History: 2, Vascular Disease History: 1, Age Score: 2, Gender Score: 0].  Therefore, the patient's annual risk of stroke is 10.8 %.   We discussed the significance of his stroke risk.  He stopped anticoagulation in the past due to nosebleeds.  I explained to him that we would likely need to refer him back to Calvin if he had recurrent nosebleeds on anticoagulation.  He also has recently been seen by gastroenterology for evaluation of heme positive stools.  The patient notes that he was actually able to see blood in his stool as well.  Therefore, I am not certain if he is able to resume anticoagulation at this time.  In reading the notes from gastroenterology it appears that he was to remain off of anticoagulation.  I will reach out to gastroenterology to confirm if he should remain off of anticoagulation at this time.  Once he is cleared to resume anticoagulation, I would suggest starting back on Apixaban 5 mg twice daily.  If he has recurrent epistaxis, he will need referral back to Calvin for further management.  3. Coronary artery disease involving native coronary artery of native heart without angina pectoris Moderate nonobstructive disease by cardiac catheterization October 2020.  He is not having anginal symptoms.  He is currently not on antiplatelet therapy due to need for anticoagulation therapy.  If he cannot tolerate anticoagulation therapy going forward, we should likely try to initiate aspirin therapy.  4. Stage 3a chronic kidney disease (HCC) Recent creatinine normal.  5. Essential hypertension Blood pressure above goal.  Restart metoprolol succinate as noted.  6. Heme positive stool As noted, he has seen gastroenterology with plans for colonoscopy in the near future.   .  Time:   Today, I have spent 31 minutes with the patient with telehealth technology discussing the above problems.     Medication  Adjustments/Labs and Tests Ordered: Current medicines are reviewed at length with the patient today.  Concerns regarding medicines are outlined above.   Tests Ordered: No orders of the defined types were placed in this encounter.   Medication Changes: No orders of the defined types were placed in this encounter.   Follow Up:  In Person in 6 week(s)  Signed, [name], PA-C  05/12/2020 12:58 PM    [place]

## 2020-05-12 ENCOUNTER — Encounter: Payer: Self-pay | Admitting: Physician Assistant

## 2020-05-12 ENCOUNTER — Telehealth (INDEPENDENT_AMBULATORY_CARE_PROVIDER_SITE_OTHER): Payer: Medicare Other | Admitting: Physician Assistant

## 2020-05-12 ENCOUNTER — Telehealth: Payer: Self-pay

## 2020-05-12 ENCOUNTER — Other Ambulatory Visit: Payer: Self-pay

## 2020-05-12 VITALS — BP 140/80 | Ht 72.0 in | Wt 240.0 lb

## 2020-05-12 DIAGNOSIS — R195 Other fecal abnormalities: Secondary | ICD-10-CM

## 2020-05-12 DIAGNOSIS — N1831 Chronic kidney disease, stage 3a: Secondary | ICD-10-CM

## 2020-05-12 DIAGNOSIS — I480 Paroxysmal atrial fibrillation: Secondary | ICD-10-CM | POA: Diagnosis not present

## 2020-05-12 DIAGNOSIS — I10 Essential (primary) hypertension: Secondary | ICD-10-CM

## 2020-05-12 DIAGNOSIS — I2510 Atherosclerotic heart disease of native coronary artery without angina pectoris: Secondary | ICD-10-CM

## 2020-05-12 DIAGNOSIS — E785 Hyperlipidemia, unspecified: Secondary | ICD-10-CM

## 2020-05-12 DIAGNOSIS — I5020 Unspecified systolic (congestive) heart failure: Secondary | ICD-10-CM

## 2020-05-12 MED ORDER — FUROSEMIDE 20 MG PO TABS: 20.0000 mg | ORAL_TABLET | ORAL | 1 refills | Status: DC | PRN

## 2020-05-12 MED ORDER — METOPROLOL SUCCINATE ER 25 MG PO TB24: 25.0000 mg | ORAL_TABLET | Freq: Every day | ORAL | 1 refills | Status: DC

## 2020-05-12 MED ORDER — ENTRESTO 49-51 MG PO TABS: 1.0000 | ORAL_TABLET | Freq: Two times a day (BID) | ORAL | 1 refills | Status: DC

## 2020-05-12 NOTE — Telephone Encounter (Signed)
°  Patient Consent for Virtual Visit         [name] has provided verbal consent on 05/12/2020 for a virtual visit (video or telephone).   CONSENT FOR VIRTUAL VISIT FOR:  [name]  By participating in this virtual visit I agree to the following:  I hereby voluntarily request, consent and authorize [facility] and its employed or contracted physicians, physician assistants, nurse practitioners or other licensed health care professionals (the Practitioner), to provide me with telemedicine health care services (the Services") as deemed necessary by the treating Practitioner. I acknowledge and consent to receive the Services by the Practitioner via telemedicine. I understand that the telemedicine visit will involve communicating with the Practitioner through live audiovisual communication technology and the disclosure of certain medical information by electronic transmission. I acknowledge that I have been given the opportunity to request an in-person assessment or other available alternative prior to the telemedicine visit and am voluntarily participating in the telemedicine visit.  I understand that I have the right to withhold or withdraw my consent to the use of telemedicine in the course of my care at any time, without affecting my right to future care or treatment, and that the Practitioner or I may terminate the telemedicine visit at any time. I understand that I have the right to inspect all information obtained and/or recorded in the course of the telemedicine visit and may receive copies of available information for a reasonable fee.  I understand that some of the potential risks of receiving the Services via telemedicine include:   Delay or interruption in medical evaluation due to technological equipment failure or disruption;  Information transmitted may not be sufficient (e.g. poor resolution of images) to allow for appropriate medical decision making by the Practitioner;  and/or   In rare instances, security protocols could fail, causing a breach of personal health information.  Furthermore, I acknowledge that it is my responsibility to provide information about my medical history, conditions and care that is complete and accurate to the best of my ability. I acknowledge that Practitioner's advice, recommendations, and/or decision may be based on factors not within their control, such as incomplete or inaccurate data provided by me or distortions of diagnostic images or specimens that may result from electronic transmissions. I understand that the practice of medicine is not an exact science and that Practitioner makes no warranties or guarantees regarding treatment outcomes. I acknowledge that a copy of this consent can be made available to me via my patient portal ([facility]), or I can request a printed copy by calling the office of [place].    I understand that my insurance will be billed for this visit.   I have read or had this consent read to me.  I understand the contents of this consent, which adequately explains the benefits and risks of the Services being provided via telemedicine.   I have been provided ample opportunity to ask questions regarding this consent and the Services and have had my questions answered to my satisfaction.  I give my informed consent for the services to be provided through the use of telemedicine in my medical care

## 2020-05-12 NOTE — Patient Instructions (Signed)
Medication Instructions:  Your physician has recommended you make the following change in your medication:   START: Metoprolol Succinate 25mg  daily START: Furosemide 20mg  1 daily as needed  Continue taking Entresto 49-51mg  twice daily   *If you need a refill on your cardiac medications before your next appointment, please call your pharmacy*   Lab Work: None ordered today If you have labs (blood work) drawn today and your tests are completely normal, you will receive your results only by:  [place] (if you have MyChart) OR  A paper copy in the mail If you have any lab test that is abnormal or we need to change your treatment, we will call you to review the results.  Follow-Up: At [facility], you and your health needs are our priority.  As part of our continuing mission to provide you with exceptional heart care, we have created designated Provider Care Teams.  These Care Teams include your primary Cardiologist (physician) and Advanced Practice Providers (APPs -  Physician Assistants and Nurse Practitioners) who all work together to provide you with the care you need, when you need it.  We recommend signing up for the patient portal called "MyChart".  Sign up information is provided on this After Visit Summary.  MyChart is used to connect with patients for Virtual Visits (Telemedicine).  Patients are able to view lab/test results, encounter notes, upcoming appointments, etc.  Non-urgent messages can be sent to your provider as well.   To learn more about what you can do with MyChart, go to [email or web address].    Your next appointment:   06/23/2020  The format for your next appointment:   In Person  Provider:   [name], PA-C   Other Instructions You have been referred to our Pharmacist for help with your medications. They will contact you to get this appointment scheduled.

## 2020-05-13 ENCOUNTER — Telehealth: Payer: Self-pay | Admitting: Physician Assistant

## 2020-05-13 MED ORDER — APIXABAN 5 MG PO TABS: 5.0000 mg | ORAL_TABLET | Freq: Two times a day (BID) | ORAL | 3 refills | Status: DC

## 2020-05-13 NOTE — Telephone Encounter (Signed)
-----   Message from [place], [place] sent at 05/12/2020  2:34 PM EST ----- He can be on it until time of procedure but we will need to stop it 2 days before procedure if that is ok with you.  Thanks Ellouise Newer ----- Message ----- From: Sharmon Revere Sent: 05/12/2020  12:43 PM EST To: Liliane Shi, PA-C, #  Hi Adele Schilder saw this patient recently. He was off his Xarelto when he saw you.  He apparently stopped it on his own about a year ago.  His risk for stroke is pretty significant.  Is his risk of bleeding significant enough we should leave him off of anticoagulation until after his colonoscopy? If not, I wanted to get him back on anticoagulation - Eliquis 5 mg twice daily. If his bleeding risk is significant, we can hold off until you guys do his procedure and then decide based upon the results. Thank you! Ashyla Luth

## 2020-05-13 NOTE — Telephone Encounter (Signed)
See telephone contact from cardiology and starting Eliquis. Patient was informed to hold Eliquis 2 days prior to upcoming procedure.

## 2020-05-13 NOTE — Telephone Encounter (Signed)
-----   Message from Levin Erp, [place] sent at 05/13/2020  9:16 AM EST ----- Regarding: bt I think PCP is restarting blood thinner-can you make sure he stops it two days prior to procedure-thanks!  JLL ----- Message ----- From: Sharmon Revere Sent: 05/13/2020   9:01 AM EST To: Levin Erp, PA  Yes. That will be fine.  Thank you! Scott ----- Message ----- From: Levin Erp, PA Sent: 05/12/2020   2:34 PM EST To: Liliane Shi, PA-C  He can be on it until time of procedure but we will need to stop it 2 days before procedure if that is ok with you.  Thanks Ellouise Newer ----- Message ----- From: Sharmon Revere Sent: 05/12/2020  12:43 PM EST To: Liliane Shi, PA-C, #  Hi Adele Schilder saw this patient recently. He was off his Xarelto when he saw you.  He apparently stopped it on his own about a year ago.  His risk for stroke is pretty significant.  Is his risk of bleeding significant enough we should leave him off of anticoagulation until after his colonoscopy? If not, I wanted to get him back on anticoagulation - Eliquis 5 mg twice daily. If his bleeding risk is significant, we can hold off until you guys do his procedure and then decide based upon the results. Thank you! Scott

## 2020-05-13 NOTE — Telephone Encounter (Signed)
Reviewed with GI.  It is ok for the patient to start back on anticoagulation at this time.  He will need to hold it for 2 days prior to his colonoscopy.    PLAN:  1. Start Apixaban (Eliquis) 5 mg twice daily. 2. Do not take aspirin. 3. Please see if he can get into the PharmD clinic for med rec (referral placed yesterday at [place]) in the next 2 weeks.  He needs help with understanding his medications and what they are for.  He should bring all the medications he has at home with him to that appt. 4. He should hold his Apixaban for 2 days prior to his colonoscopy. 5. The doctor that does his colonoscopy will tell him when it is safe to resume Apixaban. [name], PA-C    05/13/2020 9:06 AM

## 2020-05-13 NOTE — Telephone Encounter (Signed)
Spoke with the patient and reviewed information from [employer], [place]. Rx has been sent in for Eliquis 5 mg BID. He is aware to not take ASA and to hold Eliquis 2 days prior to colonoscopy. Appointment scheduled for 11/23 with PharmD.

## 2020-05-27 ENCOUNTER — Other Ambulatory Visit: Payer: Self-pay

## 2020-05-27 ENCOUNTER — Ambulatory Visit (INDEPENDENT_AMBULATORY_CARE_PROVIDER_SITE_OTHER): Payer: Medicare Other | Admitting: Pharmacist

## 2020-05-27 VITALS — BP 128/80 | HR 83

## 2020-05-27 DIAGNOSIS — E119 Type 2 diabetes mellitus without complications: Secondary | ICD-10-CM

## 2020-05-27 DIAGNOSIS — I10 Essential (primary) hypertension: Secondary | ICD-10-CM | POA: Diagnosis not present

## 2020-05-27 DIAGNOSIS — I480 Paroxysmal atrial fibrillation: Secondary | ICD-10-CM | POA: Diagnosis not present

## 2020-05-27 DIAGNOSIS — E782 Mixed hyperlipidemia: Secondary | ICD-10-CM

## 2020-05-27 DIAGNOSIS — I5022 Chronic systolic (congestive) heart failure: Secondary | ICD-10-CM

## 2020-05-27 DIAGNOSIS — I5020 Unspecified systolic (congestive) heart failure: Secondary | ICD-10-CM | POA: Diagnosis not present

## 2020-05-27 MED ORDER — ROSUVASTATIN CALCIUM 20 MG PO TABS: 20.0000 mg | ORAL_TABLET | Freq: Every day | ORAL | 11 refills | Status: DC

## 2020-05-27 MED ORDER — METOPROLOL SUCCINATE ER 50 MG PO TB24
50.0000 mg | ORAL_TABLET | Freq: Every day | ORAL | 11 refills | Status: DC
Start: 2020-05-27 — End: 2020-06-23

## 2020-05-27 MED ORDER — APIXABAN 5 MG PO TABS: 5.0000 mg | ORAL_TABLET | Freq: Two times a day (BID) | ORAL | 0 refills | Status: DC

## 2020-05-27 NOTE — Progress Notes (Signed)
Patient ID: [name]                 DOB: March 27, 1935                      MRN: [id number]     HPI: [name] is a 85 y.o. male referred by [name], PA to PharmD clinic for medication reconciliation. PMH is significant for syncope, HFrEF with LVEF 35-40% on 04/2019 echo, moderate nonobstructive CAD, pulmonary HTN, DM, afib, HTN, HLD, CKD, CVA, OSA, dementia, RBBB, and prostate cancer. He had a telemedicine visit with [name] on 05/12/20. At that time, pt had been off of anticoag for a year, was not taking his Toprol or atorvastatin but was unsure why these were stopped, and had not increased Entresto dose as advised at previous visit. His Toprol was resumed. GI cleared pt to resume on anticoag - changed to Eliquis for lower bleed risk given pt's history.  Pt brings all meds to visit today. Meds/disease states reviewed below.  CHF meds:  -Pt did increase Entresto to 49-51mg  BID - will recheck BMET today -Pt did resume Toprol 25mg  daily, also on Lasix 20mg  - takes 1 pill daily, denies swelling -Note mentions pt didn't tolerate spiro due to CKD but SCr was 1.1 most recently and would benefit from therapy  Afib: -CHADS2VASc score is 41 (age x2, CHF, HTN, DM, CVA, CAD) -Xarelto d/c due to epistaxis, heme positive stool -Has been off anticoag for a year, GI recently cleared for pt to resume -Pt did start Eliquis 5mg  BID - was previously cleared to hold 2 days prior to colonoscopy  ASCVD: -Last visit mentioned pt stopped atorvastatin but unclear why -Not started on anything else since then -Had previously been on atorvastatin 20mg  daily and simvastatin 20mg  daily -LDL goal < 55mg /dL due to hx of CVA and DM -Pt states he has read negative things about statins on YouTube  DM: -Taking metformin 500mg  but only once daily instead of BID as prescribed. States he read negative things about metformin on YouTube as well. -Taking 2 glucose lowering OTC supplements - Gluco Defend and  Glucofort -A1c 6.3% on 10/08/19 -Would benefit from SGLT2i given DM and HF if affordable  Family History: The patient's family history includes Kidney Stones in his father; Liver cancer in his father. There is no history of Colon cancer or Stroke.  Social History: Former smoker, quit in 1986, denies alcohol and drug use.  Labs: 10/08/19: SCr 1.1, Na 140, A1c 6.3%  Wt Readings from Last 3 Encounters:  05/12/20 240 lb (108.9 kg)  05/08/20 240 lb 3.2 oz (109 kg)  12/31/19 240 lb (108.9 kg)   BP Readings from Last 3 Encounters:  05/12/20 140/80  05/08/20 140/80  12/31/19 (!) 148/94   Pulse Readings from Last 3 Encounters:  05/08/20 98  12/31/19 73  08/10/19 67    Renal function: CrCl cannot be calculated (Patient's most recent lab result is older than the maximum 21 days allowed.).  Past Medical History:  Diagnosis Date  . A-fib ([place])    RVR-DCCV  . Abnormal PSA 01/17/2012   10.38/4/5/ PSA:2013=6.96/ PSA 07/08/2011=6.26/ PSA 2006=8.6  . 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 Blood transfusion without reported diagnosis    during vagotomy 1976  . Cancer [facility]) 2006   L-2 solitary metastatic lesion/no rad tx ,asymptomatic  . Chronic kidney disease    nephrolithiasis right kidney  . ED (  erectile dysfunction)   . GERD (gastroesophageal reflux disease)    PUD S/P PARTIAL VAGOTOMY  . Gynecomastia 04/21/11   seen by Dr.Murray, no rad tx  . H/O impacted cerumen    S/P ENT DR BATES  . History of echocardiogram    echo 4/16:  mild LVH, EF 55-60%, Gr 1 DD, no RWMA, mild AI, mild MR, mod LAE, mild TR  . Hx of cardiovascular stress test    Myoview 5/16:  EF 50%, no scar or ischemia, low risk  . Hydroureteronephrosis    right   . Hyperglycemia    WITHOUT DM2  . Hyperlipidemia   . Hypertension   . Insomnia   . Migraines    WITH AURA  . Nephrolithiasis   . Osteopenia   . Overweight   . Prostate cancer ([place]) 12/09/03 dx   Prostate ca/adenocarcinoma,gleason=3+3=6     pSA 4.6  . Prostate cancer ([place])    METASTATIC WITH L2 SCLEROTIC LESION ON LUPRON------DR. WOODRUFF  . RBBB (right bundle branch block)   . Rosacea   . Sinus bradycardia   . Sleep apnea    pt unaware  . Stroke [facility])    asymptomatic, found on head imaging  . Ulcer    peptic  . Use of leuprolide acetate (Lupron) 06/2005-11/2006   02/2008 degarelix 02/2008 x 3 doses    Current Outpatient Medications on File Prior to Visit  Medication Sig Dispense Refill  . apixaban (ELIQUIS) 5 MG TABS tablet Take 1 tablet (5 mg total) by mouth 2 (two) times daily. 180 tablet 3  . cholecalciferol (VITAMIN D3) 25 MCG (1000 UT) tablet Take 1,000 Units by mouth every morning.    . furosemide (LASIX) 20 MG tablet Take 1 tablet (20 mg total) by mouth as needed for fluid or edema. Take 1 tablet daily as needed for leg swelling or sudden weight gain over 3lbs in 1 day. 90 tablet 1  . GLUCOSAMINE-CHONDROIT-CALCIUM PO Take 1 capsule by mouth daily.    . metFORMIN (GLUCOPHAGE) 500 MG tablet Take 1 tablet (500 mg total) by mouth 2 (two) times daily with a meal. 60 tablet 0  . metoprolol succinate (TOPROL-XL) 25 MG 24 hr tablet Take 1 tablet (25 mg total) by mouth daily. 90 tablet 1  . Multiple Vitamin (MULTIVITAMIN) tablet Take 1 tablet by mouth daily.    . sacubitril-valsartan (ENTRESTO) 49-51 MG Take 1 tablet by mouth 2 (two) times daily. 180 tablet 1  . vitamin B-12 (CYANOCOBALAMIN) 100 MCG tablet Take 100 mcg by mouth daily.    . vitamin E (VITAMIN E) 180 MG (400 UNITS) capsule Take 400 Units by mouth daily.     No current facility-administered medications on file prior to visit.    Allergies  Allergen Reactions  . Aspirin Other (See Comments)    History of "stomach ulcers," was told to not take this      Assessment/Plan:  1. CHF - LVEF 35-40%. Checking BMET today since pt resumed Entresto 49-51mg  BID. Will increase Toprol to 50mg  daily to target HR closer 60. Prior note mentioned pt not on  spironolactone due to CKD however SCr is 1.1 and pt would benefit from aldosterone antagonist therapy for mortality benefit in HFrEF. Recommend starting at next visit in 1 month. SGLT2i unfortunately cost prohibitive.  2. Afib - Pt now taking Eliquis 5mg  BID for afib. He had been off of anticoag for 1 year, previously on Xarelto but experienced epistaxis and blood in his stool, following with  GI now who cleared pt to resume anticoag. Pt was previously cleared to hold Eliquis for 2 days prior to upcoming colonoscopy. Stressed the importance of adherence to anticoag given elevated CHADS2VASc score.   3. ASCVD - LDL goal < 55 following AACE guidelines due to hx of stroke and DM. Previously on atorvastatin, unclear why he stopped this. Will start rosuvastatin 20mg  daily for similar efficacy but better side effect profile and will need repeat labs in 2-3 months.  4. DM - Pt only taking metformin 500mg  once daily rather than BID as prescribed. He was advised to take this BID but stop his 2 OTC glucose lowering supplements. A1c 6.3 earlier this year - PCP is following.  Provided pt with list of medications as well as their indications. Can follow up with pt as needed after next scheduled appt with [name] in 1 month.   Wanza Szumski E. Kieren Ricci, PharmD, BCACP, [facility] [address] [place],  [address] Phone: [phone]; Fax: [phone] 05/27/2020 3:20 PM

## 2020-05-27 NOTE — Patient Instructions (Addendum)
You can stop taking Gluco defend and Glucofort.  Start taking your metformin 1 tablet twice a day for your blood sugar  Start taking rosuvastatin 20mg  1 tablet once a day for your cholesterol  Increase your metoprolol from 25mg  to 50mg  once a day. You can take 2 of your 25mg  tablets until you run out, then pick up your new prescription for the higher dose and go back to taking 1 tablet each day  Keep your follow up with [name] next month

## 2020-05-28 LAB — BASIC METABOLIC PANEL
BUN/Creatinine Ratio: 14 (ref 10–24)
BUN: 14 mg/dL (ref 8–27)
CO2: 26 mmol/L (ref 20–29)
Calcium: 9.5 mg/dL (ref 8.6–10.2)
Chloride: 104 mmol/L (ref 96–106)
Creatinine, Ser: 1 mg/dL (ref 0.76–1.27)
GFR calc Af Amer: 80 mL/min/{1.73_m2} (ref >59)
GFR calc non Af Amer: 69 mL/min/{1.73_m2} (ref >59)
Glucose: 121 mg/dL — ABNORMAL HIGH (ref 65–99)
Potassium: 4.2 mmol/L (ref 3.5–5.2)
Sodium: 144 mmol/L (ref 134–144)

## 2020-06-10 ENCOUNTER — Other Ambulatory Visit: Payer: Self-pay | Admitting: Gastroenterology

## 2020-06-10 LAB — SARS CORONAVIRUS 2 (TAT 6-24 HRS): SARS Coronavirus 2: NEGATIVE

## 2020-06-12 ENCOUNTER — Ambulatory Visit (AMBULATORY_SURGERY_CENTER): Payer: Medicare Other | Admitting: Gastroenterology

## 2020-06-12 ENCOUNTER — Other Ambulatory Visit: Payer: Self-pay

## 2020-06-12 ENCOUNTER — Encounter: Payer: Self-pay | Admitting: Gastroenterology

## 2020-06-12 VITALS — BP 145/79 | HR 75 | Temp 95.3°F | Resp 14 | Ht 72.0 in | Wt 240.0 lb

## 2020-06-12 DIAGNOSIS — D122 Benign neoplasm of ascending colon: Secondary | ICD-10-CM

## 2020-06-12 DIAGNOSIS — R195 Other fecal abnormalities: Secondary | ICD-10-CM | POA: Diagnosis not present

## 2020-06-12 DIAGNOSIS — D123 Benign neoplasm of transverse colon: Secondary | ICD-10-CM

## 2020-06-12 MED ORDER — SODIUM CHLORIDE 0.9 % IV SOLN: 500.0000 mL | Freq: Once | INTRAVENOUS | Status: DC

## 2020-06-12 NOTE — Patient Instructions (Addendum)
Resume Eliquis tomorrow  tomorrow  Handouts on polyps and hemorrhoids given to you  Await pathology results on polyps removed     YOU HAD AN ENDOSCOPIC PROCEDURE TODAY AT [facility]:   Refer to the procedure report that was given to you for any specific questions about what was found during the examination.  If the procedure report does not answer your questions, please call your gastroenterologist to clarify.  If you requested that your care partner not be given the details of your procedure findings, then the procedure report has been included in a sealed envelope for you to review at your convenience later.  YOU SHOULD EXPECT: Some feelings of bloating in the abdomen. Passage of more gas than usual.  Walking can help get rid of the air that was put into your GI tract during the procedure and reduce the bloating. If you had a lower endoscopy (such as a colonoscopy or flexible sigmoidoscopy) you may notice spotting of blood in your stool or on the toilet paper. If you underwent a bowel prep for your procedure, you may not have a normal bowel movement for a few days.  Please Note:  You might notice some irritation and congestion in your nose or some drainage.  This is from the oxygen used during your procedure.  There is no need for concern and it should clear up in a day or so.  SYMPTOMS TO REPORT IMMEDIATELY:   Following lower endoscopy (colonoscopy or flexible sigmoidoscopy):  Excessive amounts of blood in the stool  Significant tenderness or worsening of abdominal pains  Swelling of the abdomen that is new, acute  Fever of 100F or higher    For urgent or emergent issues, a gastroenterologist can be reached at any hour by calling [phone]. Do not use MyChart messaging for urgent concerns.    DIET:  We do recommend a small meal at first, but then you may proceed to your regular diet.  Drink plenty of fluids but you should avoid alcoholic beverages for 24  hours.  ACTIVITY:  You should plan to take it easy for the rest of today and you should NOT DRIVE or use heavy machinery until tomorrow (because of the sedation medicines used during the test).    FOLLOW UP: Our staff will call the number listed on your records 48-72 hours following your procedure to check on you and address any questions or concerns that you may have regarding the information given to you following your procedure. If we do not reach you, we will leave a message.  We will attempt to reach you two times.  During this call, we will ask if you have developed any symptoms of COVID 19. If you develop any symptoms (ie: fever, flu-like symptoms, shortness of breath, cough etc.) before then, please call [phone].  If you test positive for Covid 19 in the 2 weeks post procedure, please call and report this information to [place].    If any biopsies were taken you will be contacted by phone or by letter within the next 1-3 weeks.  Please call us at [phone] if you have not heard about the biopsies in 3 weeks.    SIGNATURES/CONFIDENTIALITY: You and/or your care partner have signed paperwork which will be entered into your electronic medical record.  These signatures attest to the fact that that the information above on your After Visit Summary has been reviewed and is understood.  Full responsibility of the confidentiality of this  discharge information lies with you and/or your care-partner.

## 2020-06-12 NOTE — Progress Notes (Signed)
Called to room to assist during endoscopic procedure.  Patient ID and intended procedure confirmed with present staff. Received instructions for my participation in the procedure from the performing physician.  

## 2020-06-12 NOTE — Progress Notes (Signed)
C.W. vital signs. 

## 2020-06-12 NOTE — Op Note (Signed)
[facility] Patient Name: [name] Procedure Date: 06/12/2020 2:19 PM MRN: [id number] Endoscopist: Thornton Park MD, MD Age: 85 Referring MD:  Date of Birth: 09-Sep-1934 Gender: Male Account #: [id number] Procedure:                Colonoscopy Indications:              Hemoccult positive: Recent Hemoccult and hematuria                            testing positive, last colonoscopy in 2015 with an                            adenomatous polyp Medicines:                Monitored Anesthesia Care Procedure:                Pre-Anesthesia Assessment:                           - Prior to the procedure, a History and Physical                            was performed, and patient medications and                            allergies were reviewed. The patient's tolerance of                            previous anesthesia was also reviewed. The risks                            and benefits of the procedure and the sedation                            options and risks were discussed with the patient.                            All questions were answered, and informed consent                            was obtained. Prior Anticoagulants: The patient has                            taken Xarelto (rivaroxaban), last dose was 2 days                            prior to procedure. ASA Grade Assessment: III - A                            patient with severe systemic disease. After                            reviewing the risks and benefits, the patient was  deemed in satisfactory condition to undergo the                            procedure.                           After obtaining informed consent, the colonoscope                            was passed under direct vision. Throughout the                            procedure, the patient's blood pressure, pulse, and                            oxygen saturations were monitored continuously. The                             Colonoscope was introduced through the anus and                            advanced to the 3 cm into the ileum. The                            colonoscopy was performed without difficulty. The                            patient tolerated the procedure well. The quality                            of the bowel preparation was good. The terminal                            ileum, ileocecal valve, appendiceal orifice, and                            rectum were photographed. Scope In: 2:43:01 PM Scope Out: 3:01:44 PM Scope Withdrawal Time: 0 hours 16 minutes 11 seconds  Total Procedure Duration: 0 hours 18 minutes 43 seconds  Findings:                 The perianal and digital rectal examinations were                            normal.                           Non-bleeding internal hemorrhoids were found.                           Two flat polyps were found in the transverse colon.                            The polyps were 1 to 2 mm in size. These polyps  were removed with a piecemeal technique using a                            cold biopsy forceps. Resection and retrieval were                            complete. Estimated blood loss was minimal.                           Two sessile polyps were found in the ascending                            colon. The polyps were 1 to 2 mm in size. These                            polyps were removed with a piecemeal technique                            using a cold biopsy forceps. Resection and                            retrieval were complete. Estimated blood loss was                            minimal.                           The exam was otherwise without abnormality on                            direct and retroflexion views. Complications:            No immediate complications. Estimated blood loss:                            Minimal. Estimated Blood Loss:     Estimated blood loss was minimal. Impression:                - Non-bleeding internal hemorrhoids.                           - Two 1 to 2 mm polyps in the transverse colon,                            removed piecemeal using a cold biopsy forceps.                            Resected and retrieved.                           - Two 1 to 2 mm polyps in the ascending colon,                            removed piecemeal using a cold biopsy forceps.  Resected and retrieved.                           - The examination was otherwise normal on direct                            and retroflexion views. Recommendation:           - Patient has a contact number available for                            emergencies. The signs and symptoms of potential                            delayed complications were discussed with the                            patient. Return to normal activities tomorrow.                            Written discharge instructions were provided to the                            patient.                           - Resume previous diet.                           - Continue present medications.                           - Resume Xarelto tomorrow.                           - Await pathology results.                           - Repeat colonoscopy is not recommended due to age.                           - Emerging evidence supports eating a diet of                            fruits, vegetables, grains, calcium, and yogurt                            while reducing red meat and alcohol may reduce the                            risk of colon cancer.                           - Thank you for allowing me to be involved in your  colon cancer prevention. Thornton Park MD, MD 06/12/2020 3:08:02 PM This report has been signed electronically.

## 2020-06-12 NOTE — Progress Notes (Signed)
pt tolerated well. VSS. awake and to recovery. Report given to RN.  

## 2020-06-12 NOTE — Progress Notes (Signed)
Pt's states no medical or surgical changes since previsit or office visit. 

## 2020-06-16 ENCOUNTER — Telehealth: Payer: Self-pay | Admitting: *Deleted

## 2020-06-16 NOTE — Telephone Encounter (Signed)
°  Follow up Call-  Call back number 06/12/2020  Post procedure Call Back phone  # [phone]  Permission to leave phone message Yes  Some recent data might be hidden     Patient questions:  Do you have a fever, pain , or abdominal swelling? No. Pain Score  0 *  Have you tolerated food without any problems? Yes.    Have you been able to return to your normal activities? Yes.    Do you have any questions about your discharge instructions: Diet   No. Medications  No. Follow up visit  No.  Do you have questions or concerns about your Care? No.  Actions: * If pain score is 4 or above: No action needed, pain <4.  1. Have you developed a fever since your procedure? no  2.   Have you had an respiratory symptoms (SOB or cough) since your procedure? no  3.   Have you tested positive for COVID 19 since your procedure no  4.   Have you had any family members/close contacts diagnosed with the COVID 19 since your procedure?  no   If yes to any of these questions please route to Joylene John, RN and Joella Prince, RN

## 2020-06-18 ENCOUNTER — Encounter: Payer: Self-pay | Admitting: Gastroenterology

## 2020-06-22 NOTE — Progress Notes (Signed)
Cardiology Office Note:    Date:  06/23/2020   ID:  [name], DOB 02/16/1935, MRN [id number]  PCP:  [name], MD  [facility] Cardiologist:  Sherren Mocha, MD   [facility] Electrophysiologist:  Thompson Grayer, MD   Referring MD: [name], MD   Chief Complaint:  Follow-up (CHF, AFib)    Patient Profile:    [name] is a 85 y.o. male with:   Hx of syncope ? Vasovagal (Admx 10/2018)  NSVT   Heart failure with reduced ejection fraction  ? Hx of tachy mediated CM in 12/19 (AF w RVR) >> EF returned to normal in NSR ? Non-ischemic cardiomyopathy   Echocardiogram 04/2019: EF 35-40  Cath w mod non-obs CAD  Coronary artery disease  ? Cath 04/2019: mod non-obstructive CAD   Persistent atrial fibrillation   CHA2DS2-VASc Score = 8 [CHF History: 1, HTN History: 1, Diabetes History: 1, Stroke History: 2, Vascular Disease History: 1, Age Score: 2, Gender Score: 0].  Therefore, the patient's annual risk of stroke is 10.8 %.       Admx 12/19 >> Amio, Apixaban >> s/p TEE-DCCV   Mitral regurgitation   Pulmonary HTN   Diabetes mellitus   Hypertension   Hyperlipidemia   Chronic kidney disease   Prior CVA   OSA   Prostate CA   Dementia   RBBB   Prior CV studies:  Cardiac catheterization 04/25/2019 LM ost 20 LAD calcified, dist 55; D2 55 LCx ost 30, prox 25, mid 35; OM1 60 RCA irregs  Echocardiogram 04/23/2019 EF 35-40, diff HK worse in inf wall, normla RVSF, mild to mod MR, mod TR, mild AI, mild to mod AV sclerosis (no AS), mild PI, RVSP 52.6  Echo 09/07/2018 EF 50-55, moderate LVH, normal diastolic function, moderate LAE, moderate MAC, moderate MR, mild to moderate AI  TEE 06/19/2018 - 1. No LA thrombus, may proceed to DCCV. 2. Severe LV dilation with EF 25%, diffuse hypokinesis. 3. Mild RV dilation with moderate systolic dysfunction. 4. Severe MR with restriction of posterior leaflet and evidence for a ruptured  chord.  Echocardiogram 06/17/2018 Mild focal basal septal hypertrophy, EF 20, diffuse HKwith severe inferior HK, mild AI, mild MAC, severe MR, mild LAE, moderate to severe RVE, mild RAE, moderate TR, PASP 58  Echocardiogram 04/29/2018 Moderate LVH, EF 26-33, grade 2 diastolic dysfunction, mild AI, moderate MR, severe LAE, mild RAE, PASP 75  Myoview 11/27/2014 EF 50, no ischemia  Event monitor 10/24/2014 Sinus rhythm, PVCs, NSVT  Echocardiogram 10/22/2014 Mild concentric LVH, EF 55-60, normal wall motion, grade 1 diastolic dysfunction, mild AI, mild MR, moderate LAE, mild TR  Carotid US 10/22/2014 Bilateral ICA 1-39  History of Present Illness:    [name] was last seen via Telemedicine in 05/2020.  He was being set up for a colonoscopy due to heme + stools.  He was off of several of his medications (Metoprolol succinate, Atorvastatin, Rivaroxaban).  He noted a hx of epistaxis as the reason why his anticoagulation was stopped.  I reviewed with GI and we were able to get him back on anticoagulation with Apixaban.  I also had him resume his Metoprolol succinate.  I had him see our PharmD to help with medication reconciliation.  He was started back on statin Rx with Rosuvastatin.  Of note, SGLT2i is cost prohibitive for him.  He has since had his colonoscopy which demonstrated non-bleeding internal hemorrhoids and some polyps.  Bx was neg for malignancy.  He returns for  f/u.  He is here alone.  He does note fatigue/lethargy.  However, he has not had chest pain, significant shortness of breath, orthopnea, leg swelling or syncope.  Past Medical History:  Diagnosis Date  . A-fib ([place])    RVR-DCCV  . Abnormal PSA 01/17/2012   10.38/4/5/ PSA:2013=6.96/ PSA 07/08/2011=6.26/ PSA 2006=8.6  . 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 Blood transfusion without reported diagnosis    during vagotomy 1976  . Cancer [facility]) 2006   L-2 solitary metastatic lesion/no rad tx ,asymptomatic   . Chronic kidney disease    nephrolithiasis right kidney  . ED (erectile dysfunction)   . GERD (gastroesophageal reflux disease)    PUD S/P PARTIAL VAGOTOMY  . Gynecomastia 04/21/11   seen by Dr.Murray, no rad tx  . H/O impacted cerumen    S/P ENT DR BATES  . History of echocardiogram    echo 4/16:  mild LVH, EF 55-60%, Gr 1 DD, no RWMA, mild AI, mild MR, mod LAE, mild TR  . Hx of cardiovascular stress test    Myoview 5/16:  EF 50%, no scar or ischemia, low risk  . Hydroureteronephrosis    right   . Hyperglycemia    WITHOUT DM2  . Hyperlipidemia   . Hypertension   . Insomnia   . Migraines    WITH AURA  . Nephrolithiasis   . Osteopenia   . Overweight   . Prostate cancer ([place]) 12/09/03 dx   Prostate ca/adenocarcinoma,gleason=3+3=6    pSA 4.6  . Prostate cancer ([place])    METASTATIC WITH L2 SCLEROTIC LESION ON LUPRON------DR. WOODRUFF  . RBBB (right bundle branch block)   . Rosacea   . Sinus bradycardia   . Sleep apnea    pt unaware  . Stroke [facility])    asymptomatic, found on head imaging  . Ulcer    peptic  . Use of leuprolide acetate (Lupron) 06/2005-11/2006   02/2008 degarelix 02/2008 x 3 doses    Current Medications: Current Meds  Medication Sig  . apixaban (ELIQUIS) 5 MG TABS tablet Take 1 tablet (5 mg total) by mouth 2 (two) times daily.  . Calcium Carb-Cholecalciferol (HM CALCIUM-VITAMIN D) 600-800 MG-UNIT TABS Take 1 tablet by mouth daily.  . cholecalciferol (VITAMIN D3) 25 MCG (1000 UT) tablet Take 1,000 Units by mouth every morning.  . furosemide (LASIX) 20 MG tablet Take 1 tablet (20 mg total) by mouth as needed for fluid or edema. Take 1 tablet daily as needed for leg swelling or sudden weight gain over 3lbs in 1 day.  Marland Kitchen GLUCOSAMINE-CHONDROIT-CALCIUM PO Take 1 capsule by mouth in the morning and at bedtime.   . metFORMIN (GLUCOPHAGE) 500 MG tablet Take 1 tablet (500 mg total) by mouth 2 (two) times daily with a meal.  . metoprolol succinate (TOPROL-XL) 50 MG 24 hr  tablet Take 50 mg by mouth in the morning and at bedtime. Take with or immediately following a meal.  . rosuvastatin (CRESTOR) 20 MG tablet Take 1 tablet (20 mg total) by mouth daily.  . sacubitril-valsartan (ENTRESTO) 49-51 MG Take 1 tablet by mouth 2 (two) times daily.  . vitamin E 180 MG (400 UNITS) capsule Take 400 Units by mouth daily.     Allergies:   Aspirin   Social History   Tobacco Use  . Smoking status: Former Smoker    Types: Pipe    Quit date: 07/05/1984    Years since quitting: 35.9  . Smokeless tobacco:  Never Used  Vaping Use  . Vaping Use: Never used  Substance Use Topics  . Alcohol use: No  . Drug use: No     Family Hx: The patient's family history includes Kidney Stones in his father; Liver cancer in his father. There is no history of Colon cancer or Stroke.  Review of Systems  HENT: Negative for nosebleeds.   Gastrointestinal: Negative for hematochezia.     EKGs/Labs/Other Test Reviewed:    EKG:  EKG is   ordered today.  The ekg ordered today demonstrates sinus bradycardia, HR 56, left intrafascicular block, right bundle branch block, nonspecific ST-T wave changes, PVCs, QTC 443  Recent Labs: 05/27/2020: BUN 14; Creatinine, Ser 1.00; Potassium 4.2; Sodium 144   Recent Lipid Panel Lab Results  Component Value Date/Time   CHOL 129 04/22/2019 05:59 PM   TRIG 118 04/22/2019 05:59 PM   HDL 34 (L) 04/22/2019 05:59 PM   CHOLHDL 3.8 04/22/2019 05:59 PM   [place] 71 04/22/2019 05:59 PM      Risk Assessment/Calculations:     CHA2DS2-VASc Score = 8  This indicates a 10.8% annual risk of stroke. The patient's score is based upon: CHF History: Yes HTN History: Yes Diabetes History: Yes Stroke History: Yes Vascular Disease History: Yes Age Score: 2 Gender Score: 0     Physical Exam:    VS:  BP 138/76   Pulse (!) 56   Ht 6' (1.829 m)   Wt 242 lb 9.6 oz (110 kg)   SpO2 94%   BMI 32.90 kg/m     Wt Readings from Last 3 Encounters:  06/23/20  242 lb 9.6 oz (110 kg)  06/12/20 240 lb (108.9 kg)  05/12/20 240 lb (108.9 kg)     Constitutional:      Appearance: Healthy appearance. Not in distress.  Neck:     Vascular: No JVR.  Pulmonary:     Effort: Pulmonary effort is normal.     Breath sounds: No wheezing. No rales.  Cardiovascular:     Normal rate. Regular rhythm. Normal S1. Normal S2.     Murmurs: There is no murmur.  Edema:    Ankle: bilateral trace edema of the ankle. Abdominal:     Palpations: Abdomen is soft.  Skin:    General: Skin is warm and dry.  Neurological:     General: No focal deficit present.     Mental Status: Alert and oriented to person, place and time.     Cranial Nerves: Cranial nerves are intact.       ASSESSMENT & PLAN:    1. HFrEF (heart failure with reduced ejection fraction) (HCC) EF 35-40.  Nonischemic cardiomyopathy.  NYHA II.  Volume status appears stable.  We discussed the importance of beta-blocker, ARNI and mineralocorticoid receptor antagonist therapy in the treatment of heart failure.  Continue current dose of Entresto, metoprolol succinate.  Add spironolactone 12.5 mg daily.  Obtain BMET weekly x2.  Follow-up with Dr. Burt Knack or me in 6 to 8 weeks.  Once medications maximized, consider repeat echocardiogram.  2. Coronary artery disease involving native coronary artery of native heart without angina pectoris Moderate nonobstructive disease by cardiac catheterization October 2020.  He has not having any anginal symptoms.  He is not on antiplatelet therapy as he is on Apixaban.  Continue rosuvastatin.  3. Paroxysmal atrial fibrillation (HCC) CHA2DS2-VASc Score = 8 [CHF History: Yes, HTN History: Yes, Diabetes History: Yes, Stroke History: Yes, Vascular Disease History: Yes, Age Score: 2, Gender  Score: 0].  Therefore, the patient's annual risk of stroke is 10.8 %.   Maintaining sinus rhythm.  He is tolerating anticoagulation.  Arrange follow-up CBC with upcoming BMET.  Continue current dose  of Apixaban.  4. Essential hypertension Blood pressure close to goal.  Add spironolactone as noted.  Continue to monitor.  5. Mixed hyperlipidemia Continue rosuvastatin.  At next visit, arrange follow-up fasting lipids and LFTs.  6. PVC (premature ventricular contraction) He has PVCs noted on electrocardiogram today.  Question if PVCs may be playing a role in his cardiomyopathy.  Obtain 3-day Zio patch to assess PVC burden.     Dispo:  Return in about 8 weeks (around 08/18/2020) for Routine Follow Up, w/ Dr. Burt Knack, or [name], PA-C, in person.   Medication Adjustments/Labs and Tests Ordered: Current medicines are reviewed at length with the patient today.  Concerns regarding medicines are outlined above.  Tests Ordered: Orders Placed This Encounter  Procedures  . Basic metabolic panel  . CBC  . Basic metabolic panel  . LONG TERM MONITOR (3-14 DAYS)  . EKG 12-Lead   Medication Changes: Meds ordered this encounter  Medications  . spironolactone (ALDACTONE) 25 MG tablet    Sig: Take 0.5 tablets (12.5 mg total) by mouth daily.    Dispense:  45 tablet    Refill:  3    Signed, [name], PA-C  06/23/2020 4:39 PM    [facility] [place], [place], [place]  [address] Phone: [phone]; Fax: [phone]

## 2020-06-23 ENCOUNTER — Ambulatory Visit: Payer: Medicare Other | Admitting: Physician Assistant

## 2020-06-23 ENCOUNTER — Other Ambulatory Visit: Payer: Self-pay

## 2020-06-23 ENCOUNTER — Ambulatory Visit (INDEPENDENT_AMBULATORY_CARE_PROVIDER_SITE_OTHER): Payer: Medicare Other

## 2020-06-23 ENCOUNTER — Encounter: Payer: Self-pay | Admitting: *Deleted

## 2020-06-23 ENCOUNTER — Encounter: Payer: Self-pay | Admitting: Physician Assistant

## 2020-06-23 VITALS — BP 138/76 | HR 56 | Ht 72.0 in | Wt 242.6 lb

## 2020-06-23 DIAGNOSIS — I493 Ventricular premature depolarization: Secondary | ICD-10-CM

## 2020-06-23 DIAGNOSIS — I2510 Atherosclerotic heart disease of native coronary artery without angina pectoris: Secondary | ICD-10-CM | POA: Diagnosis not present

## 2020-06-23 DIAGNOSIS — I480 Paroxysmal atrial fibrillation: Secondary | ICD-10-CM | POA: Diagnosis not present

## 2020-06-23 DIAGNOSIS — E782 Mixed hyperlipidemia: Secondary | ICD-10-CM

## 2020-06-23 DIAGNOSIS — I10 Essential (primary) hypertension: Secondary | ICD-10-CM | POA: Diagnosis not present

## 2020-06-23 DIAGNOSIS — I5020 Unspecified systolic (congestive) heart failure: Secondary | ICD-10-CM

## 2020-06-23 MED ORDER — SPIRONOLACTONE 25 MG PO TABS: 12.5000 mg | ORAL_TABLET | Freq: Every day | ORAL | 3 refills | Status: DC

## 2020-06-23 NOTE — Progress Notes (Signed)
Patient ID: [name], male   DOB: 09-Nov-1934, 85 y.o.   MRN: [id number] Patient enrolled for [employer] to ship a 3 day ZIO XT long term holter monitor to his home.

## 2020-06-23 NOTE — Patient Instructions (Addendum)
Medication Instructions:  Your physician has recommended you make the following change in your medication:   1) Start Spironolactone 25 mg, take 0.5 tablet by mouth once a day  *If you need a refill on your cardiac medications before your next appointment, please call your pharmacy*  Lab Work: Your physician recommends that you return for lab work in 1 week on 06/30/20  Your physician recommends that you return for lab work in 2 weeks on 07/07/20  If you have labs (blood work) drawn today and your tests are completely normal, you will receive your results only by: Marland Kitchen MyChart Message (if you have MyChart) OR . A paper copy in the mail If you have any lab test that is abnormal or we need to change your treatment, we will call you to review the results.  Testing/Procedures: None ordered today  Follow-Up: On 08/12/2020 at 2:15PM with [name], PA-C

## 2020-06-25 DIAGNOSIS — I493 Ventricular premature depolarization: Secondary | ICD-10-CM

## 2020-06-30 ENCOUNTER — Other Ambulatory Visit: Payer: Medicare Other | Admitting: *Deleted

## 2020-06-30 ENCOUNTER — Other Ambulatory Visit: Payer: Self-pay

## 2020-06-30 DIAGNOSIS — I5020 Unspecified systolic (congestive) heart failure: Secondary | ICD-10-CM

## 2020-06-30 DIAGNOSIS — I480 Paroxysmal atrial fibrillation: Secondary | ICD-10-CM

## 2020-07-01 ENCOUNTER — Telehealth: Payer: Self-pay

## 2020-07-01 LAB — CBC
Hematocrit: 42 % (ref 37.5–51.0)
Hemoglobin: 14.1 g/dL (ref 13.0–17.7)
MCH: 29.7 pg (ref 26.6–33.0)
MCHC: 33.6 g/dL (ref 31.5–35.7)
MCV: 88 fL (ref 79–97)
Platelets: 210 10*3/uL (ref 150–450)
RBC: 4.75 x10E6/uL (ref 4.14–5.80)
RDW: 12.8 % (ref 11.6–15.4)
WBC: 7.5 10*3/uL (ref 3.4–10.8)

## 2020-07-01 LAB — BASIC METABOLIC PANEL
BUN/Creatinine Ratio: 23 (ref 10–24)
BUN: 21 mg/dL (ref 8–27)
CO2: 21 mmol/L (ref 20–29)
Calcium: 9.2 mg/dL (ref 8.6–10.2)
Chloride: 101 mmol/L (ref 96–106)
Creatinine, Ser: 0.92 mg/dL (ref 0.76–1.27)
GFR calc Af Amer: 89 mL/min/{1.73_m2} (ref >59)
GFR calc non Af Amer: 77 mL/min/{1.73_m2} (ref >59)
Glucose: 186 mg/dL — ABNORMAL HIGH (ref 65–99)
Potassium: 4 mmol/L (ref 3.5–5.2)
Sodium: 141 mmol/L (ref 134–144)

## 2020-07-01 NOTE — Telephone Encounter (Signed)
Eliquis once every other day provides no protection from having a stroke. Hold Eliquis x 3 days, then resume twice daily  Refer to ENT for epistaxis. Tereso Newcomer, PA-C    07/01/2020 5:22 PM

## 2020-07-01 NOTE — Telephone Encounter (Signed)
I called and spoke with patient about lab results. Patient states that since being seen in the office, on 06/24/20 he has been having nose bleeds once every day that would last about an hour. The nose bleeds would come from the right nostril. Patient has not had any nose bleeds in 2 days. Patient states that he has decreased Eliquis to one tablet by mouth every other day.

## 2020-07-02 NOTE — Telephone Encounter (Signed)
I called and spoke with patient, he is aware that every other day Eliquis is not providing protection from stroke. Patient will stop Eliquis for 3 days then resume the medication twice a day. Patient will call if bleeding resumes.

## 2020-07-07 ENCOUNTER — Other Ambulatory Visit: Payer: Self-pay

## 2020-07-07 ENCOUNTER — Other Ambulatory Visit: Payer: Medicare Other

## 2020-07-07 DIAGNOSIS — I480 Paroxysmal atrial fibrillation: Secondary | ICD-10-CM

## 2020-07-07 DIAGNOSIS — I5020 Unspecified systolic (congestive) heart failure: Secondary | ICD-10-CM

## 2020-07-07 LAB — BASIC METABOLIC PANEL
BUN/Creatinine Ratio: 15 (ref 10–24)
BUN: 16 mg/dL (ref 8–27)
CO2: 25 mmol/L (ref 20–29)
Calcium: 9.4 mg/dL (ref 8.6–10.2)
Chloride: 101 mmol/L (ref 96–106)
Creatinine, Ser: 1.04 mg/dL (ref 0.76–1.27)
GFR calc Af Amer: 76 mL/min/{1.73_m2} (ref >59)
GFR calc non Af Amer: 66 mL/min/{1.73_m2} (ref >59)
Glucose: 147 mg/dL — ABNORMAL HIGH (ref 65–99)
Potassium: 4.8 mmol/L (ref 3.5–5.2)
Sodium: 140 mmol/L (ref 134–144)

## 2020-07-11 ENCOUNTER — Other Ambulatory Visit: Payer: Self-pay | Admitting: Cardiovascular Disease

## 2020-07-11 MED ORDER — ENTRESTO 49-51 MG PO TABS: 1.0000 | ORAL_TABLET | Freq: Two times a day (BID) | ORAL | 3 refills | Status: DC

## 2020-08-12 ENCOUNTER — Other Ambulatory Visit: Payer: Self-pay

## 2020-08-12 ENCOUNTER — Ambulatory Visit: Payer: Medicare Other | Admitting: Physician Assistant

## 2020-08-12 ENCOUNTER — Encounter: Payer: Self-pay | Admitting: Physician Assistant

## 2020-08-12 VITALS — BP 150/86 | HR 59 | Ht 72.0 in | Wt 246.0 lb

## 2020-08-12 DIAGNOSIS — I480 Paroxysmal atrial fibrillation: Secondary | ICD-10-CM

## 2020-08-12 DIAGNOSIS — I2510 Atherosclerotic heart disease of native coronary artery without angina pectoris: Secondary | ICD-10-CM | POA: Diagnosis not present

## 2020-08-12 DIAGNOSIS — I10 Essential (primary) hypertension: Secondary | ICD-10-CM | POA: Diagnosis not present

## 2020-08-12 DIAGNOSIS — I493 Ventricular premature depolarization: Secondary | ICD-10-CM

## 2020-08-12 DIAGNOSIS — I5020 Unspecified systolic (congestive) heart failure: Secondary | ICD-10-CM | POA: Diagnosis not present

## 2020-08-12 DIAGNOSIS — R350 Frequency of micturition: Secondary | ICD-10-CM

## 2020-08-12 DIAGNOSIS — N1831 Chronic kidney disease, stage 3a: Secondary | ICD-10-CM

## 2020-08-12 MED ORDER — ENTRESTO 97-103 MG PO TABS: 1.0000 | ORAL_TABLET | Freq: Two times a day (BID) | ORAL | 4 refills | Status: DC

## 2020-08-12 MED ORDER — FUROSEMIDE 20 MG PO TABS: 20.0000 mg | ORAL_TABLET | ORAL | 1 refills | Status: DC | PRN

## 2020-08-12 NOTE — Progress Notes (Signed)
Cardiology Office Note:    Date:  08/12/2020   ID:  [name], DOB 1935/05/31, MRN [id number]  PCP:  [name], MD  [facility] Cardiologist:  Sherren Mocha, MD   [facility] Electrophysiologist:  Thompson Grayer, MD   Referring MD: [name], MD   Chief Complaint:  No chief complaint on file.    Patient Profile:    [name] is a 86 y.o. male with:   Hx of syncope ? Vasovagal (Admx 10/2018)  NSVT   (HFrEF) heart failure with reduced ejection fraction  ? Hx of tachy mediated CM in 12/19 (AF w RVR) >> EF returned to normal in NSR ? Non-ischemic cardiomyopathy   Echocardiogram 04/2019: EF 35-40  Cath w mod non-obs CAD  Coronary artery disease  ? Cath 04/2019: mod non-obstructive CAD   Persistent atrial fibrillation  CHA2DS2-VASc Score =8[CHF History: 1, HTN History: 1, Diabetes History: 1, Stroke History: 2, Vascular Disease History: 1, Age Score: 2, Gender Score: 0].    Admx 12/19 >>Amio, Apixaban >>s/p TEE-DCCV   Mitral regurgitation   Pulmonary HTN   Diabetes mellitus   Hypertension   Hyperlipidemia   Chronic kidney disease   Prior CVA   OSA   Prostate CA   Dementia   RBBB  Prior CV studies: Zio Cardiac Monitor 07/2020 NSR, avg HR 68, occ PVCs (1.8%), rare supraventricular beats, runs (HR 105)  Cardiac catheterization 04/25/2019 LM ost 20 LAD calcified, dist 55; D2 55 LCx ost 30, prox 25, mid 35; OM1 60 RCA irregs  Echocardiogram 04/23/2019 EF 35-40, diff HK worse in inf wall, normla RVSF, mild to mod MR, mod TR, mild AI, mild to mod AV sclerosis (no AS), mild PI, RVSP 52.6  Echo 09/07/2018 EF 50-55, moderate LVH, normal diastolic function, moderate LAE, moderate MAC, moderate MR, mild to moderate AI  TEE 06/19/2018 - 1. No LA thrombus, may proceed to DCCV. 2. Severe LV dilation with EF 25%, diffuse hypokinesis. 3. Mild RV dilation with moderate systolic dysfunction. 4. Severe MR with restriction of posterior  leaflet and evidence for a ruptured chord.  Echocardiogram 06/17/2018 EF 20, severe MR, mild LAE, moderate TR, PASP 58  Echocardiogram 04/29/2018 EF 55-60, moderate MR, PASP 75  Myoview 11/27/2014 EF 50, no ischemia  Event monitor 10/24/2014 Sinus rhythm, PVCs, NSVT  Echocardiogram 10/22/2014 EF 50-93, grade 1 diastolic dysfunction, mild MR  Carotid US 10/22/2014 Bilateral ICA 1-39  History of Present Illness:    [name] was last seen in 12/21.  Since then, he developed epistaxis and reduced his Apixaban to every other day.  We had him hold Apixaban for 3 days then resume it daily.  He will need referral to ENT if epistaxis recurs.    He returns for f/u.  He is here alone.  He has not had any further epistaxis.  He is urinating frequently.  No dysuria.  He does not feel like he empties his bladder.  He had not had any worsening shortness of breath.  He has not had chest pain, orthopnea, lege edema, syncope.        Past Medical History:  Diagnosis Date  . A-fib ([place])    RVR-DCCV  . Abnormal PSA 01/17/2012   10.38/4/5/ PSA:2013=6.96/ PSA 07/08/2011=6.26/ PSA 2006=8.6  . 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 Blood transfusion without reported diagnosis    during vagotomy 1976  . Cancer [facility]) 2006   L-2 solitary metastatic lesion/no rad tx ,asymptomatic  .  Chronic kidney disease    nephrolithiasis right kidney  . ED (erectile dysfunction)   . GERD (gastroesophageal reflux disease)    PUD S/P PARTIAL VAGOTOMY  . Gynecomastia 04/21/11   seen by Dr.Murray, no rad tx  . H/O impacted cerumen    S/P ENT DR BATES  . History of echocardiogram    echo 4/16:  mild LVH, EF 55-60%, Gr 1 DD, no RWMA, mild AI, mild MR, mod LAE, mild TR  . Hx of cardiovascular stress test    Myoview 5/16:  EF 50%, no scar or ischemia, low risk  . Hydroureteronephrosis    right   . Hyperglycemia    WITHOUT DM2  . Hyperlipidemia   . Hypertension   . Insomnia   . Migraines     WITH AURA  . Nephrolithiasis   . Osteopenia   . Overweight   . Prostate cancer ([place]) 12/09/03 dx   Prostate ca/adenocarcinoma,gleason=3+3=6    pSA 4.6  . Prostate cancer ([place])    METASTATIC WITH L2 SCLEROTIC LESION ON LUPRON------DR. WOODRUFF  . RBBB (right bundle branch block)   . Rosacea   . Sinus bradycardia   . Sleep apnea    pt unaware  . Stroke [facility])    asymptomatic, found on head imaging  . Ulcer    peptic  . Use of leuprolide acetate (Lupron) 06/2005-11/2006   02/2008 degarelix 02/2008 x 3 doses    Current Medications: Current Meds  Medication Sig  . apixaban (ELIQUIS) 5 MG TABS tablet Take 1 tablet (5 mg total) by mouth 2 (two) times daily.  . Calcium Carb-Cholecalciferol (HM CALCIUM-VITAMIN D) 600-800 MG-UNIT TABS Take 1 tablet by mouth daily.  . cholecalciferol (VITAMIN D3) 25 MCG (1000 UT) tablet Take 1,000 Units by mouth every morning.  Marland Kitchen GLUCOSAMINE-CHONDROIT-CALCIUM PO Take 1 capsule by mouth in the morning and at bedtime.   . metoprolol succinate (TOPROL-XL) 50 MG 24 hr tablet Take 50 mg by mouth in the morning and at bedtime. Take with or immediately following a meal.  . rosuvastatin (CRESTOR) 20 MG tablet Take 1 tablet (20 mg total) by mouth daily.  . sacubitril-valsartan (ENTRESTO) 97-103 MG Take 1 tablet by mouth 2 (two) times daily.  Marland Kitchen spironolactone (ALDACTONE) 25 MG tablet Take 0.5 tablets (12.5 mg total) by mouth daily.  . vitamin E 180 MG (400 UNITS) capsule Take 400 Units by mouth daily.  . [DISCONTINUED] furosemide (LASIX) 20 MG tablet Take 1 tablet (20 mg total) by mouth as needed for fluid or edema. Take 1 tablet daily as needed for leg swelling or sudden weight gain over 3lbs in 1 day.  . [DISCONTINUED] sacubitril-valsartan (ENTRESTO) 49-51 MG Take 1 tablet by mouth 2 (two) times daily.     Allergies:   Aspirin   Social History   Tobacco Use  . Smoking status: Former Smoker    Types: Pipe    Quit date: 07/05/1984    Years since quitting: 36.1  .  Smokeless tobacco: Never Used  Vaping Use  . Vaping Use: Never used  Substance Use Topics  . Alcohol use: No  . Drug use: No     Family Hx: The patient's family history includes Kidney Stones in his father; Liver cancer in his father. There is no history of Colon cancer or Stroke.  ROS   EKGs/Labs/Other Test Reviewed:    EKG:  EKG is not ordered today.  The ekg ordered today demonstrates n/a  Recent Labs: 06/30/2020: Hemoglobin 14.1; Platelets 210  07/07/2020: BUN 16; Creatinine, Ser 1.04; Potassium 4.8; Sodium 140   Recent Lipid Panel Lab Results  Component Value Date/Time   CHOL 129 04/22/2019 05:59 PM   TRIG 118 04/22/2019 05:59 PM   HDL 34 (L) 04/22/2019 05:59 PM   CHOLHDL 3.8 04/22/2019 05:59 PM   [place] 71 04/22/2019 05:59 PM      Risk Assessment/Calculations:    CHA2DS2-VASc Score = 8  This indicates a 10.8% annual risk of stroke. The patient's score is based upon: CHF History: Yes HTN History: Yes Diabetes History: Yes Stroke History: Yes Vascular Disease History: Yes Age Score: 2 Gender Score: 0     Physical Exam:    VS:  BP (!) 150/86   Pulse (!) 59   Ht 6' (1.829 m)   Wt 246 lb (111.6 kg)   SpO2 97%   BMI 33.36 kg/m     Wt Readings from Last 3 Encounters:  08/12/20 246 lb (111.6 kg)  06/23/20 242 lb 9.6 oz (110 kg)  06/12/20 240 lb (108.9 kg)     Constitutional:      Appearance: Healthy appearance. Not in distress.  Neck:     Vascular: No JVR. JVD normal.  Pulmonary:     Effort: Pulmonary effort is normal.     Breath sounds: No wheezing. No rales.  Cardiovascular:     PMI at left midclavicular line. Bradycardia present. Regular rhythm. Normal S1. Normal S2.     Murmurs: There is no murmur.  Edema:    Peripheral edema absent.  Abdominal:     Palpations: Abdomen is soft.     Tenderness: There is no abdominal tenderness.  Musculoskeletal: Normal range of motion. Skin:    General: Skin is warm and dry.  Neurological:     General:  No focal deficit present.     Mental Status: Alert and oriented to person, place and time.       ASSESSMENT & PLAN:    1. HFrEF (heart failure with reduced ejection fraction) (HCC) EF 35-40. NYHA II.  Non-ischemic cardiomyopathy.  Volume status stable.  He does note urinary frequency.  I think he can cut his Lasix back to once daily as needed.  Weigh daily and take Lasix if weight increases >/= 3 lbs in 1 day or increased leg edema.  His BP is also up.  I will increase his Entresto to 97/103 mg twice daily (he can double up the dose he currently has until he starts the new prescription).  BMET in 2 weeks.  Continue current dose of Spironolactone, Metoprolol succinate.  F/u 3-4 weeks.  If BP at goal, arrange f/u echocardiogram.   2. Coronary artery disease involving native coronary artery of native heart without angina pectoris Non-obstructive CAD by cath in 2020.  No angina.  Continue rosuvastain.    3. Paroxysmal atrial fibrillation (HCC) Maintaining normal sinus rhythm.  Continue current dose of Apixaban.  If epistaxis recurs, refer to ENT.  4. Essential hypertension BP above goal.  Increase Entresto as noted.    5. Stage 3a chronic kidney disease ([place]) Recent creatinine normal.  Repeat BMET in 2 weeks.   6. PVC (premature ventricular contraction) Low burden on recent monitor.  Continue beta-blocker.   7. Urinary frequency We tried to obtain a U/A today.  However, he went to the bathroom before we could collect a sample. Reduce Lasix to prn.  If continues, f/u with PCP for U/A, etc.    Dispo:  Return in about 4 weeks (  around 09/09/2020) for Routine Follow Up, w/ [name], PA-C, in person.   Medication Adjustments/Labs and Tests Ordered: Current medicines are reviewed at length with the patient today.  Concerns regarding medicines are outlined above.  Tests Ordered: Orders Placed This Encounter  Procedures  . Basic metabolic panel   Medication Changes: Meds ordered this  encounter  Medications  . sacubitril-valsartan (ENTRESTO) 97-103 MG    Sig: Take 1 tablet by mouth 2 (two) times daily.    Dispense:  60 tablet    Refill:  4  . furosemide (LASIX) 20 MG tablet    Sig: Take 1 tablet (20 mg total) by mouth as needed for fluid or edema. Take 1 tablet daily as needed for leg swelling or sudden weight gain over 3lbs in 1 day.    Dispense:  90 tablet    Refill:  1    Signed, [name], PA-C  08/12/2020 5:30 PM    [facility] [place], [place], [place]  [address] Phone: [phone]; Fax: [phone]

## 2020-08-12 NOTE — Patient Instructions (Addendum)
Medication Instructions:  1)  Change Lasix to 1 tablet daily as needed for leg swelling or sudden weight gain over 3lbs in 1 day.  2) Increase Entresto to 97/103 twice a day   *If you need a refill on your cardiac medications before your next appointment, please call your pharmacy*   Lab Work Your physician recommends that you return for lab work in: 2 weeks   If you have labs (blood work) drawn today and your tests are completely normal, you will receive your results only by: Marland Kitchen MyChart Message (if you have MyChart) OR . A paper copy in the mail If you have any lab test that is abnormal or we need to change your treatment, we will call you to review the results.   Testing/Procedures: None ordered    Follow-Up: At [facility], you and your health needs are our priority.  As part of our continuing mission to provide you with exceptional heart care, we have created designated Provider Care Teams.  These Care Teams include your primary Cardiologist (physician) and Advanced Practice Providers (APPs -  Physician Assistants and Nurse Practitioners) who all work together to provide you with the care you need, when you need it.  We recommend signing up for the patient portal called "MyChart".  Sign up information is provided on this After Visit Summary.  MyChart is used to connect with patients for Virtual Visits (Telemedicine).  Patients are able to view lab/test results, encounter notes, upcoming appointments, etc.  Non-urgent messages can be sent to your provider as well.   To learn more about what you can do with MyChart, go to [email or web address].    Your next appointment:   3-4 week(s)  The format for your next appointment:   In Person  Provider:   You may see Sherren Mocha, MD or one of the following Advanced Practice Providers on your designated Care Team:    [name], PA-C  Robbie Lis, [place]    Other Instructions Call if you are lightheaded with the increased  dose of Entresto.  Weight yourself daily

## 2020-08-25 ENCOUNTER — Other Ambulatory Visit: Payer: Self-pay

## 2020-08-25 ENCOUNTER — Other Ambulatory Visit: Payer: Medicare Other | Admitting: *Deleted

## 2020-08-25 DIAGNOSIS — I5020 Unspecified systolic (congestive) heart failure: Secondary | ICD-10-CM

## 2020-08-25 LAB — BASIC METABOLIC PANEL
BUN/Creatinine Ratio: 17 (ref 10–24)
BUN: 16 mg/dL (ref 8–27)
CO2: 20 mmol/L (ref 20–29)
Calcium: 9.2 mg/dL (ref 8.6–10.2)
Chloride: 101 mmol/L (ref 96–106)
Creatinine, Ser: 0.93 mg/dL (ref 0.76–1.27)
GFR calc Af Amer: 87 mL/min/{1.73_m2} (ref >59)
GFR calc non Af Amer: 76 mL/min/{1.73_m2} (ref >59)
Glucose: 158 mg/dL — ABNORMAL HIGH (ref 65–99)
Potassium: 4.4 mmol/L (ref 3.5–5.2)
Sodium: 139 mmol/L (ref 134–144)

## 2020-08-26 ENCOUNTER — Telehealth: Payer: Self-pay

## 2020-08-26 NOTE — Telephone Encounter (Signed)
Called pt to give him his lab results, per APP. Pt verbalized understanding and is aware of his f/u appt next month. No questions/concerns at this time.

## 2020-09-09 NOTE — Progress Notes (Signed)
Cardiology Office Note:    Date:  09/10/2020   ID:  Field Staniszewski, DOB 1935-05-11, MRN [id number]  PCP:  [name], MD  [facility] Cardiologist:  Sherren Mocha, MD   [facility] Electrophysiologist:  Thompson Grayer, MD   Referring MD: [name], MD   Chief Complaint:    Follow-up (CHF, AFib)   Patient Profile:    [name] is a 86 y.o. male with:   Hx of syncope ? Vasovagal (Admx 10/2018)  NSVT   (HFrEF) heart failure with reduced ejection fraction  ? Hx of tachy mediated CM in 12/19 (AF w RVR) >> EF returned to normal in NSR ? Non-ischemic cardiomyopathy   Echocardiogram 04/2019: EF 35-40  Cath w mod non-obs CAD  Coronary artery disease  ? Cath 04/2019: mod non-obstructive CAD   Persistent atrial fibrillation  CHA2DS2-VASc Score =8[CHF History: 1, HTN History: 1, Diabetes History: 1, Stroke History: 2, Vascular Disease History: 1, Age Score: 2, Gender Score: 0].    Admx 12/19 >>Amio, Apixaban >>s/p TEE-DCCV   Mitral regurgitation   Pulmonary HTN   Diabetes mellitus   Hypertension   Hyperlipidemia   Chronic kidney disease   Prior CVA   OSA   Prostate CA   Dementia   RBBB  Prior CV studies: Zio Cardiac Monitor 07/2020 NSR, avg HR 68, occ PVCs (1.8%), rare supraventricular beats, runs (HR 105)  Cardiac catheterization 04/25/2019 LM ost 20 LAD calcified, dist 55; D2 55 LCx ost 30, prox 25, mid 35; OM1 60 RCA irregs  Echocardiogram 04/23/2019 EF 35-40, diff HK worse in inf wall, normla RVSF, mild to mod MR, mod TR, mild AI, mild to mod AV sclerosis (no AS), mild PI, RVSP 52.6  Echo 09/07/2018 EF 50-55, moderate LVH, normal diastolic function, moderate LAE, moderate MAC, moderate MR, mild to moderate AI  TEE 06/19/2018 - 1. No LA thrombus, may proceed to DCCV. 2. Severe LV dilation with EF 25%, diffuse hypokinesis. 3. Mild RV dilation with moderate systolic dysfunction. 4. Severe MR with restriction of posterior  leaflet and evidence for a ruptured chord.  Echocardiogram 06/17/2018 EF 20, severe MR, mild LAE, moderate TR, PASP 58  Echocardiogram 04/29/2018 EF 55-60, moderate MR, PASP 75  Myoview 11/27/2014 EF 50, no ischemia  Event monitor 10/24/2014 Sinus rhythm, PVCs, NSVT  Echocardiogram 10/22/2014 EF 41-93, grade 1 diastolic dysfunction, mild MR  Carotid US 10/22/2014 Bilateral ICA 1-39  History of Present Illness:    [name] was last seen 08/12/20.  He returns for f/u on CHF.  He is here alone.  He remains fairly stable.  He has not had any significant issues with taking furosemide as needed.  He has not had to take any doses since he was here last.  He sometimes feels "nervous" when he lays flat.  He has had this for years without significant change.  He has not had PND.  He has not had leg edema.  He has not had chest pain.  He does get short of breath with more extreme activity.  This is stable without significant change.        Past Medical History:  Diagnosis Date  . A-fib ([place])    RVR-DCCV  . Abnormal PSA 01/17/2012   10.38/4/5/ PSA:2013=6.96/ PSA 07/08/2011=6.26/ PSA 2006=8.6  . 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 Blood transfusion without reported diagnosis    during vagotomy 1976  . Cancer [facility]) 2006   L-2 solitary metastatic lesion/no  rad tx ,asymptomatic  . Chronic kidney disease    nephrolithiasis right kidney  . ED (erectile dysfunction)   . GERD (gastroesophageal reflux disease)    PUD S/P PARTIAL VAGOTOMY  . Gynecomastia 04/21/11   seen by Dr.Murray, no rad tx  . H/O impacted cerumen    S/P ENT DR BATES  . History of echocardiogram    echo 4/16:  mild LVH, EF 55-60%, Gr 1 DD, no RWMA, mild AI, mild MR, mod LAE, mild TR  . Hx of cardiovascular stress test    Myoview 5/16:  EF 50%, no scar or ischemia, low risk  . Hydroureteronephrosis    right   . Hyperglycemia    WITHOUT DM2  . Hyperlipidemia   . Hypertension   . Insomnia   .  Migraines    WITH AURA  . Nephrolithiasis   . Osteopenia   . Overweight   . Prostate cancer ([place]) 12/09/03 dx   Prostate ca/adenocarcinoma,gleason=3+3=6    pSA 4.6  . Prostate cancer ([place])    METASTATIC WITH L2 SCLEROTIC LESION ON LUPRON------DR. WOODRUFF  . RBBB (right bundle branch block)   . Rosacea   . Sinus bradycardia   . Sleep apnea    pt unaware  . Stroke [facility])    asymptomatic, found on head imaging  . Ulcer    peptic  . Use of leuprolide acetate (Lupron) 06/2005-11/2006   02/2008 degarelix 02/2008 x 3 doses    Current Medications: Current Meds  Medication Sig  . apixaban (ELIQUIS) 5 MG TABS tablet Take 1 tablet (5 mg total) by mouth 2 (two) times daily.  . Calcium Carb-Cholecalciferol (HM CALCIUM-VITAMIN D) 600-800 MG-UNIT TABS Take 1 tablet by mouth daily.  . cholecalciferol (VITAMIN D3) 25 MCG (1000 UT) tablet Take 1,000 Units by mouth every morning.  . furosemide (LASIX) 20 MG tablet Take 1 tablet (20 mg total) by mouth as needed for fluid or edema. Take 1 tablet daily as needed for leg swelling or sudden weight gain over 3lbs in 1 day.  Marland Kitchen GLUCOSAMINE-CHONDROIT-CALCIUM PO Take 1 capsule by mouth in the morning and at bedtime.   . metFORMIN (GLUCOPHAGE) 500 MG tablet Take 500 mg by mouth 2 (two) times daily with a meal.  . metoprolol succinate (TOPROL-XL) 50 MG 24 hr tablet Take 50 mg by mouth in the morning and at bedtime. Take with or immediately following a meal.  . rosuvastatin (CRESTOR) 20 MG tablet Take 1 tablet (20 mg total) by mouth daily.  . sacubitril-valsartan (ENTRESTO) 97-103 MG Take 1 tablet by mouth 2 (two) times daily.  Marland Kitchen spironolactone (ALDACTONE) 25 MG tablet Take 0.5 tablets (12.5 mg total) by mouth daily.  . vitamin E 180 MG (400 UNITS) capsule Take 400 Units by mouth daily.  . [DISCONTINUED] metFORMIN (GLUCOPHAGE) 500 MG tablet Take 1 tablet (500 mg total) by mouth 2 (two) times daily with a meal.     Allergies:   Aspirin   Social History    Tobacco Use  . Smoking status: Former Smoker    Types: Pipe    Quit date: 07/05/1984    Years since quitting: 36.2  . Smokeless tobacco: Never Used  Vaping Use  . Vaping Use: Never used  Substance Use Topics  . Alcohol use: No  . Drug use: No     Family Hx: The patient's family history includes Kidney Stones in his father; Liver cancer in his father. There is no history of Colon cancer or Stroke.  Review  of Systems  HENT: Negative for nosebleeds.       EKGs/Labs/Other Test Reviewed:    EKG:  EKG is not ordered today.  The ekg ordered today demonstrates n/a  Recent Labs: 06/30/2020: Hemoglobin 14.1; Platelets 210 08/25/2020: BUN 16; Creatinine, Ser 0.93; Potassium 4.4; Sodium 139   Recent Lipid Panel Lab Results  Component Value Date/Time   CHOL 129 04/22/2019 05:59 PM   TRIG 118 04/22/2019 05:59 PM   HDL 34 (L) 04/22/2019 05:59 PM   CHOLHDL 3.8 04/22/2019 05:59 PM   LDLCALC 71 04/22/2019 05:59 PM      Risk Assessment/Calculations:    CHA2DS2-VASc Score = 8  This indicates a 10.8% annual risk of stroke. The patient's score is based upon: CHF History: Yes HTN History: Yes Diabetes History: Yes Stroke History: Yes Vascular Disease History: Yes Age Score: 2 Gender Score: 0     Physical Exam:    VS:  BP (!) 160/100   Pulse 75   Ht 6' (1.829 m)   Wt 249 lb 12.8 oz (113.3 kg)   SpO2 96%   BMI 33.88 kg/m     Wt Readings from Last 3 Encounters:  09/10/20 249 lb 12.8 oz (113.3 kg)  08/12/20 246 lb (111.6 kg)  06/23/20 242 lb 9.6 oz (110 kg)     Constitutional:      Appearance: Healthy appearance. Not in distress.  Neck:     Vascular: No JVR. JVD normal.  Pulmonary:     Effort: Pulmonary effort is normal.     Breath sounds: No wheezing. No rales.  Cardiovascular:     Normal rate. Irregularly irregular rhythm. Normal S1. Normal S2.     Murmurs: There is no murmur.  Edema:    Peripheral edema absent.  Abdominal:     Palpations: Abdomen is soft.   Skin:    General: Skin is warm and dry.  Neurological:     General: No focal deficit present.     Mental Status: Alert and oriented to person, place and time.     Cranial Nerves: Cranial nerves are intact.       ASSESSMENT & PLAN:    1. HFrEF (heart failure with reduced ejection fraction) (HCC) EF 35-40.  NYHA II.  Nonischemic cardiomyopathy.  Volume status is stable.  He can continue on as needed furosemide.  Blood pressure is out of control.  He has not taken any medications yet today.  I will adjust his spironolactone as outlined below.  If his blood pressure remains uncontrolled, I will start adding hydralazine and nitrates.  Once his blood pressure is controlled, I will arrange a follow-up echocardiogram.  2. Paroxysmal atrial fibrillation (HCC) Rate is controlled.  He is tolerating anticoagulation.  Continue current dose of Apixaban.  3. Essential hypertension Blood pressure is out of control.  As noted, he has not take any medications yet today.  Continue current dose of Entresto, metoprolol succinate.  Increase spironolactone to 25 mg daily.  Obtain BMET 1 week.  If his blood pressure remains uncontrolled, start adding hydralazine and nitrates.     Dispo:  Return in about 2 weeks (around 09/24/2020) for Routine Follow Up, w/ [name], PA-C, in person.   Medication Adjustments/Labs and Tests Ordered: Current medicines are reviewed at length with the patient today.  Concerns regarding medicines are outlined above.  Tests Ordered: No orders of the defined types were placed in this encounter.  Medication Changes: No orders of the defined types were placed in  this encounter.   Signed, [name], PA-C  09/10/2020 12:40 PM    [facility] [place], [place], [place]  [address] Phone: [phone]; Fax: [phone]

## 2020-09-10 ENCOUNTER — Other Ambulatory Visit: Payer: Self-pay

## 2020-09-10 ENCOUNTER — Ambulatory Visit: Payer: Medicare Other | Admitting: Physician Assistant

## 2020-09-10 ENCOUNTER — Encounter: Payer: Self-pay | Admitting: Physician Assistant

## 2020-09-10 VITALS — BP 160/100 | HR 75 | Ht 72.0 in | Wt 249.8 lb

## 2020-09-10 DIAGNOSIS — N1831 Chronic kidney disease, stage 3a: Secondary | ICD-10-CM

## 2020-09-10 DIAGNOSIS — I480 Paroxysmal atrial fibrillation: Secondary | ICD-10-CM | POA: Diagnosis not present

## 2020-09-10 DIAGNOSIS — I2510 Atherosclerotic heart disease of native coronary artery without angina pectoris: Secondary | ICD-10-CM

## 2020-09-10 DIAGNOSIS — I5020 Unspecified systolic (congestive) heart failure: Secondary | ICD-10-CM

## 2020-09-10 DIAGNOSIS — I10 Essential (primary) hypertension: Secondary | ICD-10-CM

## 2020-09-10 MED ORDER — SPIRONOLACTONE 25 MG PO TABS: 25.0000 mg | ORAL_TABLET | Freq: Every day | ORAL | 3 refills | Status: DC

## 2020-09-10 NOTE — Patient Instructions (Signed)
Medication Instructions:  Your physician has recommended you make the following change in your medication:   1.  Increase Sprionolactone to one tablet by mouth ( 25 mg) daily.  *If you need a refill on your cardiac medications before your next appointment, please call your pharmacy*   Lab Work: Your physician recommends that you return for lab work in: one week for BMET  If you have labs (blood work) drawn today and your tests are completely normal, you will receive your results only by: Marland Kitchen MyChart Message (if you have MyChart) OR . A paper copy in the mail If you have any lab test that is abnormal or we need to change your treatment, we will call you to review the results.   Testing/Procedures: -None   Follow-Up: At [facility], you and your health needs are our priority.  As part of our continuing mission to provide you with exceptional heart care, we have created designated Provider Care Teams.  These Care Teams include your primary Cardiologist (physician) and Advanced Practice Providers (APPs -  Physician Assistants and Nurse Practitioners) who all work together to provide you with the care you need, when you need it.  We recommend signing up for the patient portal called "MyChart".  Sign up information is provided on this After Visit Summary.  MyChart is used to connect with patients for Virtual Visits (Telemedicine).  Patients are able to view lab/test results, encounter notes, upcoming appointments, etc.  Non-urgent messages can be sent to your provider as well.   To learn more about what you can do with MyChart, go to [email or web address].    Your next appointment:   2 week(s)  The format for your next appointment:   In Person  Provider:   You will see one of the following Advanced Practice Providers on your designated Care Team:    [name], PA-C      Other Instructions Make sure you take your am medications before you come in for your office visit with  [name], PA.

## 2020-09-19 ENCOUNTER — Other Ambulatory Visit: Payer: Self-pay

## 2020-09-19 ENCOUNTER — Other Ambulatory Visit: Payer: Medicare Other | Admitting: *Deleted

## 2020-09-19 DIAGNOSIS — I5020 Unspecified systolic (congestive) heart failure: Secondary | ICD-10-CM

## 2020-09-19 DIAGNOSIS — I10 Essential (primary) hypertension: Secondary | ICD-10-CM

## 2020-09-19 DIAGNOSIS — I480 Paroxysmal atrial fibrillation: Secondary | ICD-10-CM

## 2020-09-20 LAB — BASIC METABOLIC PANEL
BUN/Creatinine Ratio: 17 (ref 10–24)
BUN: 19 mg/dL (ref 8–27)
CO2: 23 mmol/L (ref 20–29)
Calcium: 9.2 mg/dL (ref 8.6–10.2)
Chloride: 101 mmol/L (ref 96–106)
Creatinine, Ser: 1.15 mg/dL (ref 0.76–1.27)
Glucose: 157 mg/dL — ABNORMAL HIGH (ref 65–99)
Potassium: 4.6 mmol/L (ref 3.5–5.2)
Sodium: 142 mmol/L (ref 134–144)
eGFR: 63 mL/min/{1.73_m2} (ref >59)

## 2020-09-28 ENCOUNTER — Emergency Department (HOSPITAL_COMMUNITY)
Admission: EM | Admit: 2020-09-28 | Discharge: 2020-09-29 | Disposition: A | Discharge status: Home / Self Care | Payer: Medicare Other | Attending: Emergency Medicine | Admitting: Emergency Medicine

## 2020-09-28 DIAGNOSIS — I10 Essential (primary) hypertension: Secondary | ICD-10-CM

## 2020-09-28 DIAGNOSIS — Z7901 Long term (current) use of anticoagulants: Secondary | ICD-10-CM | POA: Diagnosis not present

## 2020-09-28 DIAGNOSIS — Z7984 Long term (current) use of oral hypoglycemic drugs: Secondary | ICD-10-CM | POA: Insufficient documentation

## 2020-09-28 DIAGNOSIS — I130 Hypertensive heart and chronic kidney disease with heart failure and stage 1 through stage 4 chronic kidney disease, or unspecified chronic kidney disease: Secondary | ICD-10-CM | POA: Insufficient documentation

## 2020-09-28 DIAGNOSIS — Z8673 Personal history of transient ischemic attack (TIA), and cerebral infarction without residual deficits: Secondary | ICD-10-CM | POA: Insufficient documentation

## 2020-09-28 DIAGNOSIS — I480 Paroxysmal atrial fibrillation: Secondary | ICD-10-CM | POA: Insufficient documentation

## 2020-09-28 DIAGNOSIS — E1122 Type 2 diabetes mellitus with diabetic chronic kidney disease: Secondary | ICD-10-CM | POA: Insufficient documentation

## 2020-09-28 DIAGNOSIS — N183 Chronic kidney disease, stage 3 unspecified: Secondary | ICD-10-CM | POA: Diagnosis not present

## 2020-09-28 DIAGNOSIS — M542 Cervicalgia: Secondary | ICD-10-CM | POA: Diagnosis present

## 2020-09-28 DIAGNOSIS — Z79899 Other long term (current) drug therapy: Secondary | ICD-10-CM | POA: Diagnosis not present

## 2020-09-28 DIAGNOSIS — I5043 Acute on chronic combined systolic (congestive) and diastolic (congestive) heart failure: Secondary | ICD-10-CM | POA: Diagnosis not present

## 2020-09-28 DIAGNOSIS — M436 Torticollis: Secondary | ICD-10-CM | POA: Diagnosis not present

## 2020-09-28 DIAGNOSIS — I2510 Atherosclerotic heart disease of native coronary artery without angina pectoris: Secondary | ICD-10-CM | POA: Diagnosis not present

## 2020-09-28 DIAGNOSIS — Z8546 Personal history of malignant neoplasm of prostate: Secondary | ICD-10-CM | POA: Diagnosis not present

## 2020-09-28 MED ORDER — FENTANYL CITRATE (PF) 100 MCG/2ML IJ SOLN
50.0000 ug | Freq: Once | INTRAMUSCULAR | Status: AC
  Administered 2020-09-28: 50 ug via INTRAVENOUS
  Filled 2020-09-28: qty 2

## 2020-09-28 MED ORDER — METHOCARBAMOL 500 MG PO TABS
500.0000 mg | ORAL_TABLET | Freq: Once | ORAL | Status: AC
  Administered 2020-09-28: 500 mg via ORAL
  Filled 2020-09-28: qty 1

## 2020-09-28 MED ORDER — HYDROCODONE-ACETAMINOPHEN 5-325 MG PO TABS
1.0000 | ORAL_TABLET | Freq: Once | ORAL | Status: DC
  Administered 2020-09-28: 1 via ORAL
  Filled 2020-09-28: qty 1

## 2020-09-28 NOTE — ED Triage Notes (Signed)
Patient presents to ED via POV for stiff/painful neck since this afternoon. Patient denies any falls/trauma to neck recently. Patient attempted to take Tylenol at home with no relief. Patient denies any past history of same. Patient ambulatory to room without any assistance, AxOx4.

## 2020-09-28 NOTE — ED Provider Notes (Signed)
[place] DEPT Provider Note   CSN: [id number] Arrival date & time: 09/28/20  2147     History Chief Complaint  Patient presents with  . Torticollis    [name] is a 86 y.o. male.  Patient with history of CHF, CAD, HTN, PAF, CKD, HLD to ED for evaluation pain in the right lateral neck, progressively worsening throughout today. Worse with movement, better with resting position of head. No dizziness, nausea, cervical pain. No history of previous similar episodes.   The history is provided by the patient. No language interpreter was used.       Past Medical History:  Diagnosis Date  . A-fib ([place])    RVR-DCCV  . Abnormal PSA 01/17/2012   10.38/4/5/ PSA:2013=6.96/ PSA 07/08/2011=6.26/ PSA 2006=8.6  . 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 Blood transfusion without reported diagnosis    during vagotomy 1976  . Cancer [facility]) 2006   L-2 solitary metastatic lesion/no rad tx ,asymptomatic  . Chronic kidney disease    nephrolithiasis right kidney  . ED (erectile dysfunction)   . GERD (gastroesophageal reflux disease)    PUD S/P PARTIAL VAGOTOMY  . Gynecomastia 04/21/11   seen by Dr.Murray, no rad tx  . H/O impacted cerumen    S/P ENT DR BATES  . History of echocardiogram    echo 4/16:  mild LVH, EF 55-60%, Gr 1 DD, no RWMA, mild AI, mild MR, mod LAE, mild TR  . Hx of cardiovascular stress test    Myoview 5/16:  EF 50%, no scar or ischemia, low risk  . Hydroureteronephrosis    right   . Hyperglycemia    WITHOUT DM2  . Hyperlipidemia   . Hypertension   . Insomnia   . Migraines    WITH AURA  . Nephrolithiasis   . Osteopenia   . Overweight   . Prostate cancer ([place]) 12/09/03 dx   Prostate ca/adenocarcinoma,gleason=3+3=6    pSA 4.6  . Prostate cancer ([place])    METASTATIC WITH L2 SCLEROTIC LESION ON LUPRON------DR. WOODRUFF  . RBBB (right bundle branch block)   . Rosacea   . Sinus bradycardia   . Sleep apnea    pt  unaware  . Stroke [facility])    asymptomatic, found on head imaging  . Ulcer    peptic  . Use of leuprolide acetate (Lupron) 06/2005-11/2006   02/2008 degarelix 02/2008 x 3 doses    Patient Active Problem List   Diagnosis Date Noted  . Peripheral edema   . Acute systolic CHF (congestive heart failure) ([place]) 04/24/2019  . Weakness generalized 04/22/2019  . Chronic systolic CHF (congestive heart failure) ([place]) 10/30/2018  . Acute renal failure superimposed on stage 3 chronic kidney disease ([place]) 10/30/2018  . Syncope 10/30/2018  . Acute on chronic combined systolic and diastolic CHF (congestive heart failure) ([place]) 08/10/2018  . Mitral regurgitation 08/10/2018  . Atrial fibrillation ([place]) 06/14/2018  . PAH (pulmonary artery hypertension) ([place]) 06/13/2018  . HTN (hypertension) 06/13/2018  . Acute on chronic kidney failure ([place]) 07/20/2015  . HLD (hyperlipidemia) 07/20/2015  . Stroke (cerebrum) ([place]) 07/20/2015  . Elevated troponin 07/20/2015  . Diabetes mellitus without complication ([place]) 09/62/8366  . Nonsustained paroxysmal ventricular tachycardia ([place]) 12/08/2014  . RBBB 10/22/2014  . Bradycardia 10/22/2014  . PVC's (premature ventricular contractions) 10/22/2014  . CKD (chronic kidney disease), stage III 10/20/2014  . Use of leuprolide acetate (Lupron)   . Hydroureteronephrosis   . Blood transfusion without  reported diagnosis   . Blurry vision   . Old lacunar strokes by MRI   . Hx of Prostate cancer   . Cancer ([place])   . Abnormal PSA 01/17/2012    Past Surgical History:  Procedure Laterality Date  . CARDIOVERSION N/A 06/19/2018   Procedure: CARDIOVERSION;  Surgeon: Larey Dresser, MD;  Location: [facility] ENDOSCOPY;  Service: Cardiovascular;  Laterality: N/A;  . PROSTATE BIOPSY  12/09/03   adenocarcinoma,glerason: 3+3=6  . RIGHT/LEFT HEART CATH AND CORONARY ANGIOGRAPHY N/A 04/25/2019   Procedure: RIGHT/LEFT HEART CATH AND CORONARY ANGIOGRAPHY;  Surgeon: Leonie Man, MD;   Location: [place] CV LAB;  Service: Cardiovascular;  Laterality: N/A;  . TEE WITHOUT CARDIOVERSION N/A 06/19/2018   Procedure: TRANSESOPHAGEAL ECHOCARDIOGRAM (TEE);  Surgeon: Larey Dresser, MD;  Location[facility] ENDOSCOPY;  Service: Cardiovascular;  Laterality: N/A;  . VAGOTOMY  1978   partial       Family History  Problem Relation Age of Onset  . Liver cancer Father   . Kidney Stones Father   . Colon cancer Neg Hx   . Stroke Neg Hx     Social History   Tobacco Use  . Smoking status: Former Smoker    Types: Pipe    Quit date: 07/05/1984    Years since quitting: 36.2  . Smokeless tobacco: Never Used  Vaping Use  . Vaping Use: Never used  Substance Use Topics  . Alcohol use: No  . Drug use: No    Home Medications Prior to Admission medications   Medication Sig Start Date End Date Taking? Authorizing Provider  apixaban (ELIQUIS) 5 MG TABS tablet Take 1 tablet (5 mg total) by mouth 2 (two) times daily. 05/13/20   [name] T, PA-C  Calcium Carb-Cholecalciferol (HM CALCIUM-VITAMIN D) 600-800 MG-UNIT TABS Take 1 tablet by mouth daily.    [provider]  cholecalciferol (VITAMIN D3) 25 MCG (1000 UT) tablet Take 1,000 Units by mouth every morning.    [provider]  furosemide (LASIX) 20 MG tablet Take 1 tablet (20 mg total) by mouth as needed for fluid or edema. Take 1 tablet daily as needed for leg swelling or sudden weight gain over 3lbs in 1 day. 08/12/20   [name] T, PA-C  GLUCOSAMINE-CHONDROIT-CALCIUM PO Take 1 capsule by mouth in the morning and at bedtime.     [provider]  metFORMIN (GLUCOPHAGE) 500 MG tablet Take 500 mg by mouth 2 (two) times daily with a meal.    [provider]  metoprolol succinate (TOPROL-XL) 50 MG 24 hr tablet Take 50 mg by mouth in the morning and at bedtime. Take with or immediately following a meal.    [provider]  rosuvastatin (CRESTOR) 20 MG tablet Take 1 tablet (20 mg total) by mouth  daily. 05/27/20 05/22/21  Sherren Mocha, MD  sacubitril-valsartan (ENTRESTO) 97-103 MG Take 1 tablet by mouth 2 (two) times daily. 08/12/20   [name] T, PA-C  spironolactone (ALDACTONE) 25 MG tablet Take 1 tablet (25 mg total) by mouth daily. 09/10/20   [name] T, PA-C  vitamin E 180 MG (400 UNITS) capsule Take 400 Units by mouth daily.    [provider]    Allergies    Aspirin  Review of Systems   Review of Systems  Constitutional: Negative for diaphoresis and fever.  Eyes: Negative for visual disturbance.  Gastrointestinal: Negative for nausea.  Musculoskeletal: Positive for neck pain.  Neurological: Negative for weakness, numbness and headaches.  Physical Exam Updated Vital Signs BP (!) 165/118 (BP Location: Left Arm)   Pulse 90   Temp (!) 97.4 F (36.3 C) (Oral)   Resp 16   Ht 6' (1.829 m)   Wt 113.3 kg   SpO2 94%   BMI 33.88 kg/m   Physical Exam Vitals and nursing note reviewed.  Constitutional:      General: He is not in acute distress.    Appearance: Normal appearance. He is not ill-appearing.  HENT:     Head: Normocephalic and atraumatic.  Neck:     Vascular: No carotid bruit.   Cardiovascular:     Rate and Rhythm: Normal rate.  Pulmonary:     Effort: Pulmonary effort is normal.  Musculoskeletal:        General: Normal range of motion.     Cervical back: Normal range of motion and neck supple. Tenderness present.  Skin:    General: Skin is warm and dry.  Neurological:     Mental Status: He is alert.     Sensory: No sensory deficit.     ED Results / Procedures / Treatments   Labs (all labs ordered are listed, but only abnormal results are displayed) Labs Reviewed - No data to display  EKG None  Radiology No results found.  Procedures Procedures   Medications Ordered in ED Medications - No data to display  ED Course  I have reviewed the triage vital signs and the nursing notes.  Pertinent labs & imaging results  that were available during my care of the patient were reviewed by me and considered in my medical decision making (see chart for details).    MDM Rules/Calculators/A&P                          Patient to ED with right lateral neck pain x 1 day.   The pain is reproducible to palpation, better with rest. Likely MSK pain, but concerning given intensity of pain. Other considerations include radicular pain, vascular abnormality especially given history CAD, uncontrolled hypertension.   The patient is seen and evaluated by Dr. Kathrynn Humble. Plan will be to medicate with muscle relaxer and something for pain and reassess. If there is relief, no further work up is needed.   On recheck, he is sitting up on the side of the bed. He reports he only has pain when he turns his head to the right and feels better. He is comfortable with discharge home and this is felt reasonable. Discussed specific return precautions of recurrent severe pain, headache, lightheadedness, nausea. He is felt reliable to return to the ED if these occur.  His blood pressure is noted to be high. No chest pain, SOB. Per chart review, recent cardiology office visit noted uncontrolled blood pressure. Will refer back to cardiology to discuss any proposed medication changes.    Final Clinical Impression(s) / ED Diagnoses Final diagnoses:  None   1. Torticollis 2. Hypertension   Rx / DC Orders ED Discharge Orders    None       Charlann Lange, PA-C 09/29/20 [place], Ankit, MD 10/09/20 [phone]

## 2020-09-28 NOTE — ED Provider Notes (Incomplete)
[facility] DEPT Provider Note   CSN: [id number] Arrival date & time: 09/28/20  2147     History Chief Complaint  Patient presents with  . Torticollis    [name] is a 86 y.o. male.  Patient with history of CHF, CAD, HTN, PAF, CKD, HLD to ED for evaluation pain in the right lateral neck, progressively worsening throughout today. Worse with movement, better with resting position of head. No dizziness, nausea, cervical pain. No history of previous similar episodes.   The history is provided by the patient. No language interpreter was used.       Past Medical History:  Diagnosis Date  . A-fib ([place])    RVR-DCCV  . Abnormal PSA 01/17/2012   10.38/4/5/ PSA:2013=6.96/ PSA 07/08/2011=6.26/ PSA 2006=8.6  . 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 Blood transfusion without reported diagnosis    during vagotomy 1976  . Cancer [facility]) 2006   L-2 solitary metastatic lesion/no rad tx ,asymptomatic  . Chronic kidney disease    nephrolithiasis right kidney  . ED (erectile dysfunction)   . GERD (gastroesophageal reflux disease)    PUD S/P PARTIAL VAGOTOMY  . Gynecomastia 04/21/11   seen by Dr.Murray, no rad tx  . H/O impacted cerumen    S/P ENT DR BATES  . History of echocardiogram    echo 4/16:  mild LVH, EF 55-60%, Gr 1 DD, no RWMA, mild AI, mild MR, mod LAE, mild TR  . Hx of cardiovascular stress test    Myoview 5/16:  EF 50%, no scar or ischemia, low risk  . Hydroureteronephrosis    right   . Hyperglycemia    WITHOUT DM2  . Hyperlipidemia   . Hypertension   . Insomnia   . Migraines    WITH AURA  . Nephrolithiasis   . Osteopenia   . Overweight   . Prostate cancer ([place]) 12/09/03 dx   Prostate ca/adenocarcinoma,gleason=3+3=6    pSA 4.6  . Prostate cancer ([place])    METASTATIC WITH L2 SCLEROTIC LESION ON LUPRON------DR. WOODRUFF  . RBBB (right bundle branch block)   . Rosacea   . Sinus bradycardia   . Sleep apnea    pt  [place]  . Stroke [facility])    asymptomatic, found on head imaging  . Ulcer    peptic  . Use of leuprolide acetate (Lupron) 06/2005-11/2006   02/2008 degarelix 02/2008 x 3 doses    Patient Active Problem List   Diagnosis Date Noted  . Peripheral edema   . Acute systolic CHF (congestive heart failure) ([place]) 04/24/2019  . Weakness generalized 04/22/2019  . Chronic systolic CHF (congestive heart failure) ([place]) 10/30/2018  . Acute renal failure superimposed on stage 3 chronic kidney disease ([place]) 10/30/2018  . Syncope 10/30/2018  . Acute on chronic combined systolic and diastolic CHF (congestive heart failure) ([place]) 08/10/2018  . Mitral regurgitation 08/10/2018  . Atrial fibrillation ([place]) 06/14/2018  . PAH (pulmonary artery hypertension) ([place]) 06/13/2018  . HTN (hypertension) 06/13/2018  . Acute on chronic kidney failure ([place]) 07/20/2015  . HLD (hyperlipidemia) 07/20/2015  . Stroke (cerebrum) ([place]) 07/20/2015  . Elevated troponin 07/20/2015  . Diabetes mellitus without complication ([place]) 62/22/9798  . Nonsustained paroxysmal ventricular tachycardia ([place]) 12/08/2014  . RBBB 10/22/2014  . Bradycardia 10/22/2014  . PVC's (premature ventricular contractions) 10/22/2014  . CKD (chronic kidney disease), stage III 10/20/2014  . Use of leuprolide acetate (Lupron)   . Hydroureteronephrosis   . Blood transfusion without  reported diagnosis   . Blurry vision   . Old lacunar strokes by MRI   . Hx of Prostate cancer   . Cancer ([place])   . Abnormal PSA 01/17/2012    Past Surgical History:  Procedure Laterality Date  . CARDIOVERSION N/A 06/19/2018   Procedure: CARDIOVERSION;  Surgeon: Larey Dresser, MD;  Location[facility] ENDOSCOPY;  Service: Cardiovascular;  Laterality: N/A;  . PROSTATE BIOPSY  12/09/03   adenocarcinoma,glerason: 3+3=6  . RIGHT/LEFT HEART CATH AND CORONARY ANGIOGRAPHY N/A 04/25/2019   Procedure: RIGHT/LEFT HEART CATH AND CORONARY ANGIOGRAPHY;  Surgeon: Leonie Man, MD;   Location: [facility] CV LAB;  Service: Cardiovascular;  Laterality: N/A;  . TEE WITHOUT CARDIOVERSION N/A 06/19/2018   Procedure: TRANSESOPHAGEAL ECHOCARDIOGRAM (TEE);  Surgeon: Larey Dresser, MD;  Location: [facility] ENDOSCOPY;  Service: Cardiovascular;  Laterality: N/A;  . VAGOTOMY  1978   partial       Family History  Problem Relation Age of Onset  . Liver cancer Father   . Kidney Stones Father   . Colon cancer Neg Hx   . Stroke Neg Hx     Social History   Tobacco Use  . Smoking status: Former Smoker    Types: Pipe    Quit date: 07/05/1984    Years since quitting: 36.2  . Smokeless tobacco: Never Used  Vaping Use  . Vaping Use: Never used  Substance Use Topics  . Alcohol use: No  . Drug use: No    Home Medications Prior to Admission medications   Medication Sig Start Date End Date Taking? Authorizing Provider  apixaban (ELIQUIS) 5 MG TABS tablet Take 1 tablet (5 mg total) by mouth 2 (two) times daily. 05/13/20   [name] T, PA-C  Calcium Carb-Cholecalciferol (HM CALCIUM-VITAMIN D) 600-800 MG-UNIT TABS Take 1 tablet by mouth daily.    [provider]  cholecalciferol (VITAMIN D3) 25 MCG (1000 UT) tablet Take 1,000 Units by mouth every morning.    [provider]  furosemide (LASIX) 20 MG tablet Take 1 tablet (20 mg total) by mouth as needed for fluid or edema. Take 1 tablet daily as needed for leg swelling or sudden weight gain over 3lbs in 1 day. 08/12/20   [name] T, PA-C  GLUCOSAMINE-CHONDROIT-CALCIUM PO Take 1 capsule by mouth in the morning and at bedtime.     [provider]  metFORMIN (GLUCOPHAGE) 500 MG tablet Take 500 mg by mouth 2 (two) times daily with a meal.    [provider]  metoprolol succinate (TOPROL-XL) 50 MG 24 hr tablet Take 50 mg by mouth in the morning and at bedtime. Take with or immediately following a meal.    [provider]  rosuvastatin (CRESTOR) 20 MG tablet Take 1 tablet (20 mg total) by mouth  daily. 05/27/20 05/22/21  Sherren Mocha, MD  sacubitril-valsartan (ENTRESTO) 97-103 MG Take 1 tablet by mouth 2 (two) times daily. 08/12/20   [name] T, PA-C  spironolactone (ALDACTONE) 25 MG tablet Take 1 tablet (25 mg total) by mouth daily. 09/10/20   [name] T, PA-C  vitamin E 180 MG (400 UNITS) capsule Take 400 Units by mouth daily.    [provider]    Allergies    Aspirin  Review of Systems   Review of Systems  Constitutional: Negative for diaphoresis and fever.  Eyes: Negative for visual disturbance.  Gastrointestinal: Negative for nausea.  Musculoskeletal: Positive for neck pain.  Neurological: Negative for weakness, numbness and headaches.  Physical Exam Updated Vital Signs BP (!) 165/118 (BP Location: Left Arm)   Pulse 90   Temp (!) 97.4 F (36.3 C) (Oral)   Resp 16   Ht 6' (1.829 m)   Wt 113.3 kg   SpO2 94%   BMI 33.88 kg/m   Physical Exam Vitals and nursing note reviewed.  Constitutional:      General: He is not in acute distress.    Appearance: Normal appearance. He is not ill-appearing.  HENT:     Head: Normocephalic and atraumatic.  Neck:     Vascular: No carotid bruit.   Cardiovascular:     Rate and Rhythm: Normal rate.  Pulmonary:     Effort: Pulmonary effort is normal.  Musculoskeletal:        General: Normal range of motion.     Cervical back: Normal range of motion and neck supple. Tenderness present.  Skin:    General: Skin is warm and dry.  Neurological:     Mental Status: He is alert.     Sensory: No sensory deficit.     ED Results / Procedures / Treatments   Labs (all labs ordered are listed, but only abnormal results are displayed) Labs Reviewed - No data to display  EKG None  Radiology No results found.  Procedures Procedures {Remember to document critical care time when appropriate:1}  Medications Ordered in ED Medications - No data to display  ED Course  I have reviewed the triage vital signs  and the nursing notes.  Pertinent labs & imaging results that were available during my care of the patient were reviewed by me and considered in my medical decision making (see chart for details).    MDM Rules/Calculators/A&P                          Patient to ED with right lateral neck pain x 1 day.   The pain is reproducible to palpation, better with rest. Likely MSK pain, but concerning given intensity of pain. Other considerations include radicular pain, vascular abnormality especially given history CAD, uncontrolled hypertension.   The patient is seen and evaluated by Dr. Kathrynn Humble.  Final Clinical Impression(s) / ED Diagnoses Final diagnoses:  None    Rx / DC Orders ED Discharge Orders    None

## 2020-09-29 LAB — BASIC METABOLIC PANEL
Anion gap: 11 (ref 5–15)
BUN: 20 mg/dL (ref 8–23)
CO2: 22 mmol/L (ref 22–32)
Calcium: 9.3 mg/dL (ref 8.9–10.3)
Chloride: 103 mmol/L (ref 98–111)
Creatinine, Ser: 1.21 mg/dL (ref 0.61–1.24)
GFR, Estimated: 59 mL/min — ABNORMAL LOW (ref >60)
Glucose, Bld: 156 mg/dL — ABNORMAL HIGH (ref 70–99)
Potassium: 5.2 mmol/L — ABNORMAL HIGH (ref 3.5–5.1)
Sodium: 136 mmol/L (ref 135–145)

## 2020-09-29 LAB — CBC WITH DIFFERENTIAL/PLATELET
Abs Immature Granulocytes: 0.04 10*3/uL (ref 0.00–0.07)
Basophils Absolute: 0.1 10*3/uL (ref 0.0–0.1)
Basophils Relative: 1 %
Eosinophils Absolute: 0.1 10*3/uL (ref 0.0–0.5)
Eosinophils Relative: 1 %
HCT: 44.1 % (ref 39.0–52.0)
Hemoglobin: 14.3 g/dL (ref 13.0–17.0)
Immature Granulocytes: 0 %
Lymphocytes Relative: 15 %
Lymphs Abs: 1.7 10*3/uL (ref 0.7–4.0)
MCH: 29.5 pg (ref 26.0–34.0)
MCHC: 32.4 g/dL (ref 30.0–36.0)
MCV: 90.9 fL (ref 80.0–100.0)
Monocytes Absolute: 0.8 10*3/uL (ref 0.1–1.0)
Monocytes Relative: 7 %
Neutro Abs: 8.4 10*3/uL — ABNORMAL HIGH (ref 1.7–7.7)
Neutrophils Relative %: 76 %
Platelets: 148 10*3/uL — ABNORMAL LOW (ref 150–400)
RBC: 4.85 MIL/uL (ref 4.22–5.81)
RDW: 13.2 % (ref 11.5–15.5)
WBC: 11 10*3/uL — ABNORMAL HIGH (ref 4.0–10.5)
nRBC: 0 % (ref 0.0–0.2)

## 2020-09-29 MED ORDER — HYDROCODONE-ACETAMINOPHEN 5-325 MG PO TABS: 1.0000 | ORAL_TABLET | ORAL | 0 refills | Status: DC | PRN

## 2020-09-29 MED ORDER — METHOCARBAMOL 500 MG PO TABS: 500.0000 mg | ORAL_TABLET | Freq: Two times a day (BID) | ORAL | 0 refills | Status: DC

## 2020-09-29 NOTE — Discharge Instructions (Signed)
Follow up with Dr. [name] in 2 days for recheck of neck pain.   Return to the emergency department if you have any severe pain, lightheadedness, severe headache, nausea or new concern.   Take the medications for pain and muscle spasm as prescribed.

## 2020-10-01 ENCOUNTER — Other Ambulatory Visit: Payer: Self-pay

## 2020-10-01 ENCOUNTER — Ambulatory Visit: Payer: Medicare Other | Admitting: Physician Assistant

## 2020-10-01 ENCOUNTER — Encounter: Payer: Self-pay | Admitting: Physician Assistant

## 2020-10-01 VITALS — BP 122/64 | HR 67 | Ht 72.0 in | Wt 242.6 lb

## 2020-10-01 DIAGNOSIS — I10 Essential (primary) hypertension: Secondary | ICD-10-CM

## 2020-10-01 DIAGNOSIS — I5020 Unspecified systolic (congestive) heart failure: Secondary | ICD-10-CM

## 2020-10-01 DIAGNOSIS — I480 Paroxysmal atrial fibrillation: Secondary | ICD-10-CM | POA: Diagnosis not present

## 2020-10-01 NOTE — Patient Instructions (Signed)
Medication Instructions:  Your physician recommends that you continue on your current medications as directed. Please refer to the Current Medication list given to you today.  *If you need a refill on your cardiac medications before your next appointment, please call your pharmacy*   Lab Work: None ordered  If you have labs (blood work) drawn today and your tests are completely normal, you will receive your results only by: Marland Kitchen MyChart Message (if you have MyChart) OR . A paper copy in the mail If you have any lab test that is abnormal or we need to change your treatment, we will call you to review the results.   Testing/Procedures: Your physician has requested that you have an echocardiogram. Echocardiography is a painless test that uses sound waves to create images of your heart. It provides your doctor with information about the size and shape of your heart and how well your heart's chambers and valves are working. This procedure takes approximately one hour. There are no restrictions for this procedure.     Follow-Up: At [facility], you and your health needs are our priority.  As part of our continuing mission to provide you with exceptional heart care, we have created designated Provider Care Teams.  These Care Teams include your primary Cardiologist (physician) and Advanced Practice Providers (APPs -  Physician Assistants and Nurse Practitioners) who all work together to provide you with the care you need, when you need it.  We recommend signing up for the patient portal called "MyChart".  Sign up information is provided on this After Visit Summary.  MyChart is used to connect with patients for Virtual Visits (Telemedicine).  Patients are able to view lab/test results, encounter notes, upcoming appointments, etc.  Non-urgent messages can be sent to your provider as well.   To learn more about what you can do with MyChart, go to [email or web address].    Your next appointment:    6 month(s)  The format for your next appointment:   In Person  Provider:   Sherren Mocha, MD or [name], PA-C   Other Instructions  Echocardiogram An echocardiogram is a test that uses sound waves (ultrasound) to produce images of the heart. Images from an echocardiogram can provide important information about:  Heart size and shape.  The size and thickness and movement of your heart's walls.  Heart muscle function and strength.  Heart valve function or if you have stenosis. Stenosis is when the heart valves are too narrow.  If blood is flowing backward through the heart valves (regurgitation).  A tumor or infectious growth around the heart valves.  Areas of heart muscle that are not working well because of poor blood flow or injury from a heart attack.  Aneurysm detection. An aneurysm is a weak or damaged part of an artery wall. The wall bulges out from the normal force of blood pumping through the body. Tell a health care provider about:  Any allergies you have.  All medicines you are taking, including vitamins, herbs, eye drops, creams, and over-the-counter medicines.  Any blood disorders you have.  Any surgeries you have had.  Any medical conditions you have.  Whether you are pregnant or may be pregnant. What are the risks? Generally, this is a safe test. However, problems may occur, including an allergic reaction to dye (contrast) that may be used during the test. What happens before the test? No specific preparation is needed. You may eat and drink normally. What happens during the test?  You will take off your clothes from the waist up and put on a hospital gown.  Electrodes or electrocardiogram (ECG)patches may be placed on your chest. The electrodes or patches are then connected to a device that monitors your heart rate and rhythm.  You will lie down on a table for an ultrasound exam. A gel will be applied to your chest to help sound waves pass  through your skin.  A handheld device, called a transducer, will be pressed against your chest and moved over your heart. The transducer produces sound waves that travel to your heart and bounce back (or "echo" back) to the transducer. These sound waves will be captured in real-time and changed into images of your heart that can be viewed on a video monitor. The images will be recorded on a computer and reviewed by your health care provider.  You may be asked to change positions or hold your breath for a short time. This makes it easier to get different views or better views of your heart.  In some cases, you may receive contrast through an IV in one of your veins. This can improve the quality of the pictures from your heart. The procedure may vary among health care providers and hospitals.   What can I expect after the test? You may return to your normal, everyday life, including diet, activities, and medicines, unless your health care provider tells you not to do that. Follow these instructions at home:  It is up to you to get the results of your test. Ask your health care provider, or the department that is doing the test, when your results will be ready.  Keep all follow-up visits. This is important. Summary  An echocardiogram is a test that uses sound waves (ultrasound) to produce images of the heart.  Images from an echocardiogram can provide important information about the size and shape of your heart, heart muscle function, heart valve function, and other possible heart problems.  You do not need to do anything to prepare before this test. You may eat and drink normally.  After the echocardiogram is completed, you may return to your normal, everyday life, unless your health care provider tells you not to do that. This information is not intended to replace advice given to you by your health care provider. Make sure you discuss any questions you have with your health care  provider. Document Revised: 02/12/2020 Document Reviewed: 02/12/2020 Elsevier Patient Education  2021 [employer].

## 2020-10-01 NOTE — Progress Notes (Addendum)
Cardiology Office Note:    Date:  10/01/2020   ID:  Vegas Fritze, DOB 02/26/1935, MRN [id number]  PCP:  [name], [place]  Cardiologist:  Sherren Mocha, MD  Advanced Practice Provider:  Liliane Shi, PA-C Electrophysiologist:  Thompson Grayer, MD       Referring MD: [name], MD   Chief Complaint:  Follow-up (CHF)    Patient Profile:    [name] is a 86 y.o. male with:   Hx of syncope ? Vasovagal (Admx 10/2018)  NSVT   (HFrEF) heart failure with reduced ejection fraction  ? Hx of tachy mediated CM in 12/19 (AF w RVR) >> EF returned to normal in NSR ? Non-ischemic cardiomyopathy   Echocardiogram 04/2019: EF 35-40  Cath w mod non-obs CAD  Coronary artery disease  ? Cath 04/2019: mod non-obstructive CAD   Persistent atrial fibrillation  CHA2DS2-VASc Score =8[CHF History: 1, HTN History: 1, Diabetes History: 1, Stroke History: 2, Vascular Disease History: 1, Age Score: 2, Gender Score: 0].    Admx 12/19 >>Amio, Apixaban >>s/p TEE-DCCV   Mitral regurgitation   Pulmonary HTN   Diabetes mellitus   Hypertension   Hyperlipidemia   Chronic kidney disease   Prior CVA   OSA   Prostate CA   Dementia   RBBB  Prior CV studies: [facility] Cardiac Monitor 07/2020 NSR, avg HR 68, occ PVCs (1.8%), rare supraventricular beats, runs (HR 105)  Cardiac catheterization 04/25/2019 LM ost 20 LAD calcified, dist 55; D2 55 LCx ost 30, prox 25, mid 35; OM1 60 RCA irregs  Echocardiogram 04/23/2019 EF 35-40, diff HK worse in inf wall, normla RVSF, mild to mod MR, mod TR, mild AI, mild to mod AV sclerosis (no AS), mild PI, RVSP 52.6  Echo 09/07/2018 EF 50-55, moderate LVH, normal diastolic function, moderate LAE, moderate MAC, moderate MR, mild to moderate AI  TEE 06/19/2018 - 1. No LA thrombus, may proceed to DCCV. 2. Severe LV dilation with EF 25%, diffuse hypokinesis. 3. Mild RV dilation with moderate systolic  dysfunction. 4. Severe MR with restriction of posterior leaflet and evidence for a ruptured chord.  Echocardiogram 06/17/2018 EF 20, severe MR, mild LAE, moderate TR, PASP 58  Echocardiogram 04/29/2018 EF 55-60, moderate MR, PASP 75  Myoview 11/27/2014 EF 50, no ischemia  Event monitor 10/24/2014 Sinus rhythm, PVCs, NSVT  Echocardiogram 10/22/2014 EF 35-57, grade 1 diastolic dysfunction, mild MR  Carotid US 10/22/2014 Bilateral ICA 1-39    History of Present Illness:    [name] returns for follow-up.  He was in the emergency room several days ago with torticollis.  His blood pressure significantly elevated at that time.  Today, his blood pressure is much better.  He has shortness of breath with more extreme activities.  His breathing is overall stable.  He has not had chest pain, orthopnea, significant leg edema.  He has not had syncope.  He has not had any bleeding issues.         Past Medical History:  Diagnosis Date  . A-fib ([place])    RVR-DCCV  . Abnormal PSA 01/17/2012   10.38/4/5/ PSA:2013=6.96/ PSA 07/08/2011=6.26/ PSA 2006=8.6  . 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 Blood transfusion without reported diagnosis    during vagotomy 1976  . Cancer [facility]) 2006   L-2 solitary metastatic lesion/no rad tx ,asymptomatic  . Chronic kidney disease    nephrolithiasis right kidney  . ED (  erectile dysfunction)   . GERD (gastroesophageal reflux disease)    PUD S/P PARTIAL VAGOTOMY  . Gynecomastia 04/21/11   seen by Dr.Murray, no rad tx  . H/O impacted cerumen    S/P ENT DR BATES  . History of echocardiogram    echo 4/16:  mild LVH, EF 55-60%, Gr 1 DD, no RWMA, mild AI, mild MR, mod LAE, mild TR  . Hx of cardiovascular stress test    Myoview 5/16:  EF 50%, no scar or ischemia, low risk  . Hydroureteronephrosis    right   . Hyperglycemia    WITHOUT DM2  . Hyperlipidemia   . Hypertension   . Insomnia   . Migraines    WITH AURA  .  Nephrolithiasis   . Osteopenia   . Overweight   . Prostate cancer ([place]) 12/09/03 dx   Prostate ca/adenocarcinoma,gleason=3+3=6    pSA 4.6  . Prostate cancer ([place])    METASTATIC WITH L2 SCLEROTIC LESION ON LUPRON------DR. WOODRUFF  . RBBB (right bundle branch block)   . Rosacea   . Sinus bradycardia   . Sleep apnea    pt unaware  . Stroke [facility])    asymptomatic, found on head imaging  . Ulcer    peptic  . Use of leuprolide acetate (Lupron) 06/2005-11/2006   02/2008 degarelix 02/2008 x 3 doses    Current Medications: Current Meds  Medication Sig  . apixaban (ELIQUIS) 5 MG TABS tablet Take 1 tablet (5 mg total) by mouth 2 (two) times daily.  Marland Kitchen azithromycin (ZITHROMAX) 250 MG tablet Take 250 mg by mouth See admin instructions. 2 tabs on day 1, then 1 tablet daily for 4 days  . Calcium Carb-Cholecalciferol (HM CALCIUM-VITAMIN D) 600-800 MG-UNIT TABS Take 1 tablet by mouth daily.  . cholecalciferol (VITAMIN D3) 25 MCG (1000 UT) tablet Take 1,000 Units by mouth every morning.  . furosemide (LASIX) 20 MG tablet Take 1 tablet (20 mg total) by mouth as needed for fluid or edema. Take 1 tablet daily as needed for leg swelling or sudden weight gain over 3lbs in 1 day.  Marland Kitchen GLUCOSAMINE-CHONDROIT-CALCIUM PO Take 1 capsule by mouth in the morning and at bedtime.   Marland Kitchen HYDROcodone-acetaminophen (NORCO/VICODIN) 5-325 MG tablet Take 1 tablet by mouth every 4 (four) hours as needed for severe pain.  . metFORMIN (GLUCOPHAGE) 500 MG tablet Take 500 mg by mouth 2 (two) times daily with a meal.  . methocarbamol (ROBAXIN) 500 MG tablet Take 1 tablet (500 mg total) by mouth 2 (two) times daily.  . metoprolol succinate (TOPROL-XL) 50 MG 24 hr tablet Take 50 mg by mouth in the morning and at bedtime. Take with or immediately following a meal.  . oseltamivir (TAMIFLU) 75 MG capsule Take 75 mg by mouth 2 (two) times daily.  . rosuvastatin (CRESTOR) 20 MG tablet Take 1 tablet (20 mg total) by mouth daily.  .  sacubitril-valsartan (ENTRESTO) 97-103 MG Take 1 tablet by mouth 2 (two) times daily.  Marland Kitchen spironolactone (ALDACTONE) 25 MG tablet Take 1 tablet (25 mg total) by mouth daily.  . vitamin E 180 MG (400 UNITS) capsule Take 400 Units by mouth daily.     Allergies:   Aspirin   Social History   Tobacco Use  . Smoking status: Former Smoker    Types: Pipe    Quit date: 07/05/1984    Years since quitting: 36.2  . Smokeless tobacco: Never Used  Vaping Use  . Vaping Use: Never used  Substance Use  Topics  . Alcohol use: No  . Drug use: No     Family Hx: The patient's family history includes Kidney Stones in his father; Liver cancer in his father. There is no history of Colon cancer or Stroke.  ROS   EKGs/Labs/Other Test Reviewed:    EKG:  EKG is not ordered today.  The ekg ordered today demonstrates n/a  Recent Labs: 09/28/2020: BUN 20; Creatinine, Ser 1.21; Hemoglobin 14.3; Platelets 148; Potassium 5.2; Sodium 136   Recent Lipid Panel Lab Results  Component Value Date/Time   CHOL 129 04/22/2019 05:59 PM   TRIG 118 04/22/2019 05:59 PM   HDL 34 (L) 04/22/2019 05:59 PM   CHOLHDL 3.8 04/22/2019 05:59 PM   [place] 71 04/22/2019 05:59 PM      Risk Assessment/Calculations:    CHA2DS2-VASc Score = 8  This indicates a 10.8% annual risk of stroke. The patient's score is based upon: CHF History: Yes HTN History: Yes Diabetes History: Yes Stroke History: Yes Vascular Disease History: Yes Age Score: 2 Gender Score: 0     Physical Exam:    VS:  BP 122/64   Pulse 67   Ht 6' (1.829 m)   Wt 242 lb 9.6 oz (110 kg)   SpO2 94%   BMI 32.90 kg/m     Wt Readings from Last 3 Encounters:  10/01/20 242 lb 9.6 oz (110 kg)  09/28/20 249 lb 12.5 oz (113.3 kg)  09/10/20 249 lb 12.8 oz (113.3 kg)     Constitutional:      Appearance: Healthy appearance. Not in distress.  Neck:     Vascular: JVD normal.  Pulmonary:     Effort: Pulmonary effort is normal.     Breath sounds: No  wheezing. No rales.  Cardiovascular:     Normal rate. Irregular rhythm. Normal S1. Normal S2.     Murmurs: There is no murmur.  Edema:    Pretibial: bilateral trace edema of the pretibial area. Abdominal:     Palpations: Abdomen is soft.  Skin:    General: Skin is warm and dry.  Neurological:     General: No focal deficit present.     Mental Status: Alert and oriented to person, place and time.     Cranial Nerves: Cranial nerves are intact.          ASSESSMENT & PLAN:    1. HFrEF (heart failure with reduced ejection fraction) (HCC) EF 35-40.  NYHA II.  Nonischemic cardiomyopathy.  Volume status is stable.  His blood pressure is now well controlled.  He is on a good regimen for heart failure which includes metoprolol succinate, Entresto, spironolactone.  I will arrange a follow-up echocardiogram to reassess LV function.   2. Essential hypertension The patient's blood pressure is controlled on his current regimen.  Continue current therapy.   3. Paroxysmal atrial fibrillation (HCC) He is tolerating anticoagulation.  Recent creatinine, hemoglobin normal.  Continue current therapy.    Dispo:  Return in about 6 months (around 04/03/2021) for Routine Follow Up, w/ Dr. Burt Knack, or [name], PA-C, in person.   Medication Adjustments/Labs and Tests Ordered: Current medicines are reviewed at length with the patient today.  Concerns regarding medicines are outlined above.  Tests Ordered: Orders Placed This Encounter  Procedures  . ECHOCARDIOGRAM COMPLETE   Medication Changes: No orders of the defined types were placed in this encounter.   Signed, [name], PA-C  10/01/2020 5:11 PM    [facility] [phone]  [address] [place], [place]  [address] Phone: [phone]; Fax: [phone]

## 2020-11-04 ENCOUNTER — Ambulatory Visit (HOSPITAL_COMMUNITY): Payer: Medicare Other | Attending: Cardiovascular Disease

## 2020-11-04 ENCOUNTER — Other Ambulatory Visit: Payer: Self-pay

## 2020-11-04 ENCOUNTER — Encounter: Payer: Self-pay | Admitting: Physician Assistant

## 2020-11-04 DIAGNOSIS — I5020 Unspecified systolic (congestive) heart failure: Secondary | ICD-10-CM | POA: Insufficient documentation

## 2020-11-04 LAB — ECHOCARDIOGRAM COMPLETE
Area-P 1/2: 2.93 cm2
MV M vel: 4.62 m/s
MV Peak grad: 85.5 mmHg
P 1/2 time: 437 msec
Radius: 0.57 cm
S' Lateral: 4.7 cm

## 2020-11-20 ENCOUNTER — Telehealth: Payer: Self-pay | Admitting: Physician Assistant

## 2020-11-20 NOTE — Telephone Encounter (Signed)
Attempted to return phone call but does not have a VM setup.

## 2020-11-20 NOTE — Telephone Encounter (Signed)
ollow Up:     Pt would like his Echo results from 11-04-20 please

## 2020-11-25 NOTE — Telephone Encounter (Signed)
Reviewed with Dr. Burt Knack. There is nothing else to do at this time for mitral regurgitation. Plan repeat echocardiogram in 1 year.  [name], PA-C   11/06/2020 9:10 AM        Liliane Shi, PA-C  11/04/2020 5:20 PM EDT      The echocardiogram shows return of normal heart function. The ejection fraction is now normal. This is great news! The mitral valve does leak. I will have Dr. Burt Knack take a look at the echocardiogram as well.  PLAN:  - Continue current medications/treatment plan and follow up as scheduled.  - Send copy to PCP [name], PA-C   11/04/2020 5:17 PM

## 2020-11-26 ENCOUNTER — Encounter: Payer: Self-pay | Admitting: Cardiovascular Disease

## 2020-11-26 NOTE — Telephone Encounter (Signed)
Pt is calling in regards to his results from his Heart monitor 2 weeks ago. Please advise

## 2020-11-26 NOTE — Telephone Encounter (Signed)
[name]   LM  11/26/20 11:49 AM Note Pt is calling in regards to his results from his Heart monitor 2 weeks ago. Please advise       Attempted to call patient. VM has not been set-up yet, unable to leave message.  Will try again later.

## 2020-11-26 NOTE — Telephone Encounter (Signed)
This encounter was created in error - please disregard.

## 2020-12-02 ENCOUNTER — Encounter: Payer: Self-pay | Admitting: *Deleted

## 2021-01-26 ENCOUNTER — Emergency Department (HOSPITAL_COMMUNITY): Payer: Medicare Other

## 2021-01-26 ENCOUNTER — Emergency Department (HOSPITAL_COMMUNITY)
Admission: EM | Admit: 2021-01-26 | Discharge: 2021-01-26 | Disposition: A | Discharge status: Home / Self Care | Payer: Medicare Other | Attending: Emergency Medicine | Admitting: Emergency Medicine

## 2021-01-26 ENCOUNTER — Other Ambulatory Visit: Payer: Self-pay

## 2021-01-26 ENCOUNTER — Encounter (HOSPITAL_COMMUNITY): Payer: Self-pay

## 2021-01-26 DIAGNOSIS — I129 Hypertensive chronic kidney disease with stage 1 through stage 4 chronic kidney disease, or unspecified chronic kidney disease: Secondary | ICD-10-CM | POA: Diagnosis not present

## 2021-01-26 DIAGNOSIS — S7001XA Contusion of right hip, initial encounter: Secondary | ICD-10-CM | POA: Insufficient documentation

## 2021-01-26 DIAGNOSIS — Z7901 Long term (current) use of anticoagulants: Secondary | ICD-10-CM | POA: Insufficient documentation

## 2021-01-26 DIAGNOSIS — Z87891 Personal history of nicotine dependence: Secondary | ICD-10-CM | POA: Insufficient documentation

## 2021-01-26 DIAGNOSIS — Z79899 Other long term (current) drug therapy: Secondary | ICD-10-CM | POA: Insufficient documentation

## 2021-01-26 DIAGNOSIS — W010XXA Fall on same level from slipping, tripping and stumbling without subsequent striking against object, initial encounter: Secondary | ICD-10-CM

## 2021-01-26 DIAGNOSIS — W01198A Fall on same level from slipping, tripping and stumbling with subsequent striking against other object, initial encounter: Secondary | ICD-10-CM | POA: Diagnosis not present

## 2021-01-26 DIAGNOSIS — S79911A Unspecified injury of right hip, initial encounter: Secondary | ICD-10-CM | POA: Diagnosis present

## 2021-01-26 DIAGNOSIS — N189 Chronic kidney disease, unspecified: Secondary | ICD-10-CM | POA: Insufficient documentation

## 2021-01-26 DIAGNOSIS — Z8546 Personal history of malignant neoplasm of prostate: Secondary | ICD-10-CM | POA: Insufficient documentation

## 2021-01-26 MED ORDER — HYDROCODONE-ACETAMINOPHEN 5-325 MG PO TABS: 1.0000 | ORAL_TABLET | Freq: Four times a day (QID) | ORAL | 0 refills | Status: DC | PRN

## 2021-01-26 NOTE — ED Triage Notes (Signed)
Pt had a fall 2 days ago and complains of pain to his right hip.

## 2021-01-26 NOTE — ED Provider Notes (Signed)
[facility] DEPT Provider Note: Georgena Spurling, MD, FACEP  CSN: [id number] MRN: [id number] ARRIVAL: 01/26/21 at 0333 ROOM: WA10/WA10   CHIEF COMPLAINT  Hip Injury   HISTORY OF PRESENT ILLNESS  01/26/21 3:59 AM Fonzie Asare is a 86 y.o. male who slipped on rainwater 2 days ago and fell hitting his right hip.  He states he is having pain in his right lateral hip which has only worsened since then.  He rates the pain as a 9 out of 10, worse with palpation or with weightbearing.  He is able to weight-bear and ambulate but it is painful.  He states the pain is not sharp pain.   Past Medical History:  Diagnosis Date   (HFimpEF) heart failure with improved EF    Echo 04/2019: EF 35-40 // Echo 5/22: EF 55-60, no RWMA, GR 1 DD, moderate to severe MR, myxomatous mitral valve, similar to prior echocardiogram, mildly elevated PASP, RVSP 35.9, mild AI, mild AV sclerosis without AS, mild dilation of ascending aorta (43 mm)   A-fib (HCC)    RVR-DCCV   Abnormal PSA 01/17/2012   10.38/4/5/ PSA:2013=6.96/ PSA 07/08/2011=6.26/ PSA 2006=8.6   Aneurysm (HCC)    MILD 2.7 CM FUSIFORM llIAC ANEURYSM   Blood transfusion without reported diagnosis    during vagotomy 1976   Cancer ([place]) 2006   L-2 solitary metastatic lesion/no rad tx ,asymptomatic   Chronic kidney disease    nephrolithiasis right kidney   ED (erectile dysfunction)    GERD (gastroesophageal reflux disease)    PUD S/P PARTIAL VAGOTOMY   Gynecomastia 04/21/11   seen by Dr.Murray, no rad tx   H/O impacted cerumen    S/P ENT DR BATES   History of echocardiogram    echo 4/16:  mild LVH, EF 55-60%, Gr 1 DD, no RWMA, mild AI, mild MR, mod LAE, mild TR   Hx of cardiovascular stress test    Myoview 5/16:  EF 50%, no scar or ischemia, low risk   Hydroureteronephrosis    right    Hyperglycemia    WITHOUT DM2   Hyperlipidemia    Hypertension    Insomnia    Migraines    WITH AURA   Nephrolithiasis    Osteopenia    Overweight     Prostate cancer ([place]) 12/09/03 dx   Prostate ca/adenocarcinoma,gleason=3+3=6    pSA 4.6   Prostate cancer ([place])    METASTATIC WITH L2 SCLEROTIC LESION ON LUPRON------DR. WOODRUFF   RBBB (right bundle branch block)    Rosacea    Sinus bradycardia    Sleep apnea    pt unaware   Stroke [facility])    asymptomatic, found on head imaging   Ulcer    peptic   Use of leuprolide acetate (Lupron) 06/2005-11/2006   02/2008 degarelix 02/2008 x 3 doses    Past Surgical History:  Procedure Laterality Date   CARDIOVERSION N/A 06/19/2018   Procedure: CARDIOVERSION;  Surgeon: Larey Dresser, MD;  [facility] ENDOSCOPY;  Service: Cardiovascular;  Laterality: N/A;   PROSTATE BIOPSY  12/09/03   adenocarcinoma,glerason: 3+3=6   RIGHT/LEFT HEART CATH AND CORONARY ANGIOGRAPHY N/A 04/25/2019   Procedure: RIGHT/LEFT HEART CATH AND CORONARY ANGIOGRAPHY;  Surgeon: Leonie Man, MD;  Location: [facility] CV LAB;  Service: Cardiovascular;  Laterality: N/A;   TEE WITHOUT CARDIOVERSION N/A 06/19/2018   Procedure: TRANSESOPHAGEAL ECHOCARDIOGRAM (TEE);  Surgeon: Larey Dresser, MD;  Location: [facility] ENDOSCOPY;  Service: Cardiovascular;  Laterality: N/A;   VAGOTOMY  1978  partial    Family History  Problem Relation Age of Onset   Liver cancer Father    Kidney Stones Father    Colon cancer Neg Hx    Stroke Neg Hx     Social History   Tobacco Use   Smoking status: Former    Types: Pipe    Quit date: 07/05/1984    Years since quitting: 36.5   Smokeless tobacco: Never  Vaping Use   Vaping Use: Never used  Substance Use Topics   Alcohol use: No   Drug use: No    Prior to Admission medications   Medication Sig Start Date End Date Taking? Authorizing Provider  HYDROcodone-acetaminophen (NORCO) 5-325 MG tablet Take 1 tablet by mouth every 6 (six) hours as needed for severe pain. 01/26/21  Yes Kartier Bennison, MD  apixaban (ELIQUIS) 5 MG TABS tablet Take 1 tablet (5 mg total) by mouth 2 (two) times daily. 05/13/20    [name] T, PA-C  Calcium Carb-Cholecalciferol (HM CALCIUM-VITAMIN D) 600-800 MG-UNIT TABS Take 1 tablet by mouth daily.    [provider]  cholecalciferol (VITAMIN D3) 25 MCG (1000 UT) tablet Take 1,000 Units by mouth every morning.    [provider]  furosemide (LASIX) 20 MG tablet Take 1 tablet (20 mg total) by mouth as needed for fluid or edema. Take 1 tablet daily as needed for leg swelling or sudden weight gain over 3lbs in 1 day. 08/12/20   [name] T, PA-C  GLUCOSAMINE-CHONDROIT-CALCIUM PO Take 1 capsule by mouth in the morning and at bedtime.     [provider]  metFORMIN (GLUCOPHAGE) 500 MG tablet Take 500 mg by mouth 2 (two) times daily with a meal.    [provider]  methocarbamol (ROBAXIN) 500 MG tablet Take 1 tablet (500 mg total) by mouth 2 (two) times daily. 09/29/20   Charlann Lange, PA-C  metoprolol succinate (TOPROL-XL) 50 MG 24 hr tablet Take 50 mg by mouth in the morning and at bedtime. Take with or immediately following a meal.    [provider]  rosuvastatin (CRESTOR) 20 MG tablet Take 1 tablet (20 mg total) by mouth daily. 05/27/20 05/22/21  Sherren Mocha, MD  sacubitril-valsartan (ENTRESTO) 97-103 MG Take 1 tablet by mouth 2 (two) times daily. 08/12/20   [name] T, PA-C  spironolactone (ALDACTONE) 25 MG tablet Take 1 tablet (25 mg total) by mouth daily. 09/10/20   [name] T, PA-C  vitamin E 180 MG (400 UNITS) capsule Take 400 Units by mouth daily.    [provider]    Allergies Aspirin   REVIEW OF SYSTEMS  Negative except as noted here or in the History of Present Illness.   PHYSICAL EXAMINATION  Initial Vital Signs Blood pressure (!) 149/96, pulse 85, temperature 98.3 F (36.8 C), temperature source Oral, resp. rate 18, height 6' (1.829 m), weight 108.9 kg, SpO2 93 %.  Examination General: Well-developed, well-nourished male in no acute distress; appearance consistent with age of  record HENT: normocephalic; atraumatic Eyes: Normal appearance Neck: supple Heart: regular rate and rhythm Lungs: clear to auscultation bilaterally Abdomen: soft; nondistended; nontender; bowel sounds present Extremities: Tenderness over right greater trochanter; edema of lower legs Neurologic: Awake, alert and oriented; motor function intact in all extremities and symmetric; no facial droop Skin: Warm and dry Psychiatric: Normal mood and affect   RESULTS  Summary of this visit's results, reviewed and interpreted by myself:   EKG Interpretation  Date/Time:  Ventricular Rate:    PR Interval:    QRS Duration:   QT Interval:    QTC Calculation:   R Axis:     Text Interpretation:         Laboratory Studies: No results found for this or any previous visit (from the past 24 hour(s)). Imaging Studies: DG Hip Unilat W or Wo Pelvis 2-3 Views Right  Result Date: 01/26/2021 CLINICAL DATA:  Fall 2 days ago with right hip pain EXAM: DG HIP (WITH OR WITHOUT PELVIS) 2-3V RIGHT COMPARISON:  None. FINDINGS: There is no evidence of hip fracture or dislocation. No degenerative hip narrowing. Diffuse osteopenia. Diffuse atheromatous calcification. IMPRESSION: No acute finding. Electronically Signed   By: Monte Fantasia M.D.   On: 01/26/2021 04:44    ED COURSE and MDM  Nursing notes, initial and subsequent vitals signs, including pulse oximetry, reviewed and interpreted by myself.  Vitals:   01/26/21 0348 01/26/21 0351 01/26/21 0415 01/26/21 0532  BP:  (!) 149/96 (!) 187/96 (!) 189/98  Pulse:  85 83 82  Resp:  '18 19 18  '$ Temp:  98.3 F (36.8 C)    TempSrc:  Oral    SpO2:  93% 96% 96%  Weight: 108.9 kg     Height: 6' (1.829 m)      Medications - No data to display  No evidence of fracture.  The patient is in significant pain and I think a brief treatment with hydrocodone is justified.  If there is an occult fracture it would most likely be of the greater trochanter since he is able  to bear weight.  A fall could also have caused an acute trochanteric bursitis.  Since the patient is on Eliquis it is likely he has a hematoma.  If pain persists he should follow-up with his PCP.  PROCEDURES  Procedures   ED DIAGNOSES     ICD-10-CM   1. Fall from slip, trip, or stumble, initial encounter  W01.0XXA     2. Hematoma of right hip, initial encounter  S70.01XA          Neriah Brott, Jenny Reichmann, MD 01/26/21 [phone]

## 2021-03-30 ENCOUNTER — Other Ambulatory Visit: Payer: Self-pay | Admitting: Physician Assistant

## 2021-04-03 ENCOUNTER — Other Ambulatory Visit: Payer: Self-pay | Admitting: Physician Assistant

## 2021-04-03 MED ORDER — ENTRESTO 97-103 MG PO TABS: 1.0000 | ORAL_TABLET | Freq: Two times a day (BID) | ORAL | 1 refills | Status: DC

## 2021-05-28 ENCOUNTER — Other Ambulatory Visit: Payer: Self-pay | Admitting: Cardiovascular Disease

## 2021-06-17 ENCOUNTER — Other Ambulatory Visit (HOSPITAL_COMMUNITY): Payer: Self-pay | Admitting: Urology

## 2021-06-17 DIAGNOSIS — C61 Malignant neoplasm of prostate: Secondary | ICD-10-CM

## 2021-07-07 ENCOUNTER — Other Ambulatory Visit: Payer: Self-pay

## 2021-07-07 ENCOUNTER — Encounter (HOSPITAL_COMMUNITY)
Admission: RE | Admit: 2021-07-07 | Discharge: 2021-07-07 | Disposition: A | Discharge status: Home / Self Care | Payer: Medicare Other | Source: Ambulatory Visit | Attending: Urology | Admitting: Urology

## 2021-07-07 DIAGNOSIS — C61 Malignant neoplasm of prostate: Secondary | ICD-10-CM | POA: Insufficient documentation

## 2021-07-07 MED ORDER — PIFLIFOLASTAT F 18 (PYLARIFY) INJECTION
9.0000 | Freq: Once | INTRAVENOUS | Status: AC
  Administered 2021-07-07: 7.98 via INTRAVENOUS

## 2021-08-03 NOTE — Progress Notes (Signed)
[facility] OF [place]  ASSESSMENT / PLAN: Damarian Priola is a 87 y.o. male with a 50mm right common iliac artery aneurysm.   The patient is a candidate for elective repair of the aneurysm to prevent rupture.  I explained the risks / benefits / alternatives to different approaches to aortic reconstruction.   Recommend the following to reduce the risk of major adverse cardiac / limb events.  Complete cessation from all tobacco products. Excellent blood glucose control with goal A1c < 7%. Blood pressure control with goal blood pressure < 140/90 mmHg. Excellent lipid reduction therapy with goal LDL-C <100 mg/dL. Aspirin 81mg  PO QD.  Atorvastatin 40-80mg  PO QD (or other "high intensity" statin therapy).  Plan CT angiogram of the abdomen and pelvis as soon as possible. Will discuss the results by telephone. If his anatomy is favorable we will then pursue an endovascular repair (EVAR).  CHIEF COMPLAINT: Right common iliac artery aneurysm demonstrated incidentally  HISTORY OF PRESENT ILLNESS: Lemar Bakos is a 87 y.o. male who presents to clinic for discussion of right common iliac artery aneurysm.  The patient is unaware that he had this finding on his recent PET/CT scan, done for surveillance of prostate cancer.  I reviewed the images with the patient.  We had a lengthy discussion about the natural history of aneurysm disease, and the rationale for treatment.  He is thankfully asymptomatic from an aneurysm standpoint.  Past Medical History:  Diagnosis Date   (HFimpEF) heart failure with improved EF    Echo 04/2019: EF 35-40 // Echo 5/22: EF 55-60, no RWMA, GR 1 DD, moderate to severe MR, myxomatous mitral valve, similar to prior echocardiogram, mildly elevated PASP, RVSP 35.9, mild AI, mild AV sclerosis without AS, mild dilation of ascending aorta (43 mm)   A-fib (HCC)    RVR-DCCV   Abnormal PSA 01/17/2012   10.38/4/5/ PSA:2013=6.96/ PSA 07/08/2011=6.26/ PSA 2006=8.6    Aneurysm (HCC)    MILD 2.7 CM FUSIFORM llIAC ANEURYSM   Blood transfusion without reported diagnosis    during vagotomy 1976   Cancer ([place]) 2006   L-2 solitary metastatic lesion/no rad tx ,asymptomatic   Chronic kidney disease    nephrolithiasis right kidney   ED (erectile dysfunction)    GERD (gastroesophageal reflux disease)    PUD S/P PARTIAL VAGOTOMY   Gynecomastia 04/21/11   seen by Dr.Murray, no rad tx   H/O impacted cerumen    S/P ENT DR BATES   History of echocardiogram    echo 4/16:  mild LVH, EF 55-60%, Gr 1 DD, no RWMA, mild AI, mild MR, mod LAE, mild TR   Hx of cardiovascular stress test    Myoview 5/16:  EF 50%, no scar or ischemia, low risk   Hydroureteronephrosis    right    Hyperglycemia    WITHOUT DM2   Hyperlipidemia    Hypertension    Insomnia    Migraines    WITH AURA   Nephrolithiasis    Osteopenia    Overweight    Prostate cancer ([place]) 12/09/03 dx   Prostate ca/adenocarcinoma,gleason=3+3=6    pSA 4.6   Prostate cancer ([place])    METASTATIC WITH L2 SCLEROTIC LESION ON LUPRON------DR. WOODRUFF   RBBB (right bundle branch block)    Rosacea    Sinus bradycardia    Sleep apnea    pt unaware   Stroke [facility])    asymptomatic, found on head imaging   Ulcer    peptic   Use of  leuprolide acetate (Lupron) 06/2005-11/2006   02/2008 degarelix 02/2008 x 3 doses    Past Surgical History:  Procedure Laterality Date   CARDIOVERSION N/A 06/19/2018   Procedure: CARDIOVERSION;  Surgeon: Larey Dresser, MD;  Location: [facility] ENDOSCOPY;  Service: Cardiovascular;  Laterality: N/A;   PROSTATE BIOPSY  12/09/03   adenocarcinoma,glerason: 3+3=6   RIGHT/LEFT HEART CATH AND CORONARY ANGIOGRAPHY N/A 04/25/2019   Procedure: RIGHT/LEFT HEART CATH AND CORONARY ANGIOGRAPHY;  Surgeon: Leonie Man, MD;  Location: [place] CV LAB;  Service: Cardiovascular;  Laterality: N/A;   TEE WITHOUT CARDIOVERSION N/A 06/19/2018   Procedure: TRANSESOPHAGEAL ECHOCARDIOGRAM (TEE);  Surgeon:  Larey Dresser, MD;  Location: [facility] ENDOSCOPY;  Service: Cardiovascular;  Laterality: N/A;   VAGOTOMY  1978   partial    Family History  Problem Relation Age of Onset   Liver cancer Father    Kidney Stones Father    Colon cancer Neg Hx    Stroke Neg Hx     Social History   Socioeconomic History   Marital status: Widowed    Spouse name: Not on file   Number of children: 2   Years of education: Not on file   Highest education level: Not on file  Occupational History   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  Widower.   Retired Animator.   Social Determinants of Health   Financial Resource Strain: Not on file  Food Insecurity: Not on file  Transportation Needs: Not on file  Physical Activity: Not on file  Stress: Not on file  Social Connections: Not on file  Intimate Partner Violence: Not on file    Allergies  Allergen Reactions   Aspirin Other (See Comments)    History of "stomach ulcers," was told to not take this     Current Outpatient Medications  Medication Sig Dispense Refill   apixaban (ELIQUIS) 5 MG TABS tablet Take 1 tablet (5 mg total) by mouth 2 (two) times daily. 180 tablet 3   Calcium Carb-Cholecalciferol (HM CALCIUM-VITAMIN D) 600-800 MG-UNIT TABS Take 1 tablet by mouth daily.     cholecalciferol (VITAMIN D3) 25 MCG (1000 UT) tablet Take 1,000 Units by mouth every morning.     furosemide (LASIX) 20 MG tablet Take 1 tablet (20 mg total) by mouth daily. Please make overdue appt for future refills Thank you 1st attempt 30 tablet 0   GLUCOSAMINE-CHONDROIT-CALCIUM PO Take 1  capsule by mouth in the morning and at bedtime.      metFORMIN (GLUCOPHAGE) 500 MG tablet Take 500 mg by mouth 2 (two) times daily with a meal.     methocarbamol (ROBAXIN) 500 MG tablet Take 1 tablet (500 mg total) by mouth 2 (two) times daily. 20 tablet 0   metoprolol succinate (TOPROL-XL) 50 MG 24 hr tablet Take 50 mg by mouth in the morning and at bedtime. Take with or immediately following a meal.     rosuvastatin (CRESTOR) 20 MG tablet TAKE 1 TABLET(20 MG) BY MOUTH DAILY 30 tablet  11   sacubitril-valsartan (ENTRESTO) 97-103 MG Take 1 tablet by mouth 2 (two) times daily. 180 tablet 1   spironolactone (ALDACTONE) 25 MG tablet Take 1 tablet (25 mg total) by mouth daily. 90 tablet 3   vitamin E 180 MG (400 UNITS) capsule Take 400 Units by mouth daily.     No current facility-administered medications for this visit.    REVIEW OF SYSTEMS:  [X]  denotes positive finding, [ ]  denotes negative finding Cardiac  Comments:  Chest pain or chest pressure:    Shortness of breath upon exertion:    Short of breath when lying flat:    Irregular heart rhythm:        Vascular    Pain in calf, thigh, or hip brought on by ambulation:    Pain in feet at night that wakes you up from your sleep:     Blood clot in your veins:    Leg swelling:         Pulmonary    Oxygen at home:    Productive cough:     Wheezing:         Neurologic    Sudden weakness in arms or legs:     Sudden numbness in arms or legs:     Sudden onset of difficulty speaking or slurred speech:    Temporary loss of vision in one eye:     Problems with dizziness:         Gastrointestinal    Blood in stool:     Vomited blood:         Genitourinary    Burning when urinating:     Blood in urine:        Psychiatric    Major depression:         Hematologic    Bleeding problems:    Problems with blood clotting too easily:        Skin    Rashes or ulcers:        Constitutional    Fever or chills:      PHYSICAL  EXAM Vitals:   08/04/21 1311  BP: (!) 178/94  Pulse: 72  Resp: 20  Temp: 97.9 F (36.6 C)  SpO2: 94%  Weight: 254 lb (115.2 kg)  Height: 6' (1.829 m)    Constitutional: well appearing. no distress. Appears well nourished.  Neurologic: CN intact. no focal findings. no sensory loss. Psychiatric:  Mood and affect symmetric and appropriate. Eyes:  No icterus. No conjunctival pallor. Ears, nose, throat:  mucous membranes moist. Midline trachea.  Cardiac: regular rate and rhythm.  Respiratory:  unlabored. Abdominal:  soft, non-tender, non-distended.  Extremity: no edema. no cyanosis. no pallor.  Skin: no gangrene. no ulceration.  Lymphatic: no Stemmer's sign. no palpable lymphadenopathy.  PERTINENT LABORATORY AND RADIOLOGIC DATA  Most recent CBC CBC Latest Ref Rng & Units 09/28/2020 06/30/2020 04/26/2019  WBC 4.0 - 10.5 K/uL 11.0(H) 7.5 6.3  Hemoglobin 13.0 - 17.0 g/dL 14.3 14.1 13.1  Hematocrit 39.0 - 52.0 % 44.1 42.0 40.7  Platelets 150 - 400 K/uL 148(L) 210 216     Most recent CMP CMP Latest Ref Rng & Units 09/28/2020 09/19/2020 08/25/2020  Glucose 70 - 99 mg/dL 156(H) 157(H) 158(H)  BUN 8 - 23 mg/dL 20 19 16   Creatinine 0.61 - 1.24 mg/dL 1.21 1.15 0.93  Sodium 135 - 145 mmol/L 136 142 139  Potassium 3.5 - 5.1 mmol/L 5.2(H) 4.6 4.4  Chloride 98 - 111 mmol/L 103 101  101  CO2 22 - 32 mmol/L 22 23 20   Calcium 8.9 - 10.3 mg/dL 9.3 9.2 9.2  Total Protein 6.5 - 8.1 g/dL - - -  Total Bilirubin 0.3 - 1.2 mg/dL - - -  Alkaline Phos 38 - 126 U/L - - -  AST 15 - 41 U/L - - -  ALT 0 - 44 U/L - - -    Renal function CrCl cannot be calculated (Patient's most recent lab result is older than the maximum 21 days allowed.).  Hgb A1c MFr Bld (%)  Date Value  04/22/2019 6.3 (H)    LDL Cholesterol  Date Value Ref Range Status  04/22/2019 71 0 - 99 mg/dL Final    Comment:           Total Cholesterol/HDL:CHD Risk Coronary Heart Disease Risk Table                     Men   Women   1/2 Average Risk   3.4   3.3  Average Risk       5.0   4.4  2 X Average Risk   9.6   7.1  3 X Average Risk  23.4   11.0        Use the calculated Patient Ratio above and the CHD Risk Table to determine the patient's CHD Risk.        ATP III CLASSIFICATION (LDL):  <100     mg/dL   Optimal  100-129  mg/dL   Near or Above                    Optimal  130-159  mg/dL   Borderline  160-189  mg/dL   High  >190     mg/dL   Very High Performed at [facility] [address], [place], [place] [address]     Recent PET/CT scan personally reviewed in detail.  He has a 38 mm right common iliac artery aneurysm.  There is no contrast in the arterial structures, preventing accurate planning of an endovascular repair.  Yevonne Aline. Stanford Breed, MD [facility] Phone Number: [phone] 08/04/2021 1:19 PM  Total time spent on preparing this encounter including chart review, data review, collecting history, examining the patient, coordinating care for this new patient, 60 minutes.  Portions of this report may have been transcribed using voice recognition software.  Every effort has been made to ensure accuracy; however, inadvertent computerized transcription errors may still be present.

## 2021-08-04 ENCOUNTER — Other Ambulatory Visit: Payer: Self-pay

## 2021-08-04 ENCOUNTER — Encounter: Payer: Self-pay | Admitting: Vascular Surgery

## 2021-08-04 ENCOUNTER — Ambulatory Visit: Payer: Medicare Other | Admitting: Vascular Surgery

## 2021-08-04 VITALS — BP 178/94 | HR 72 | Temp 97.9°F | Resp 20 | Ht 72.0 in | Wt 254.0 lb

## 2021-08-04 DIAGNOSIS — I723 Aneurysm of iliac artery: Secondary | ICD-10-CM | POA: Diagnosis not present

## 2021-08-05 ENCOUNTER — Other Ambulatory Visit: Payer: Self-pay

## 2021-08-05 DIAGNOSIS — I723 Aneurysm of iliac artery: Secondary | ICD-10-CM

## 2021-08-07 ENCOUNTER — Other Ambulatory Visit: Payer: Self-pay | Admitting: Cardiovascular Disease

## 2021-08-07 ENCOUNTER — Other Ambulatory Visit: Payer: Self-pay | Admitting: Physician Assistant

## 2021-08-17 ENCOUNTER — Ambulatory Visit
Admission: RE | Admit: 2021-08-17 | Discharge: 2021-08-17 | Disposition: A | Discharge status: Home / Self Care | Payer: Medicare Other | Source: Ambulatory Visit | Attending: Vascular Surgery | Admitting: Vascular Surgery

## 2021-08-17 DIAGNOSIS — I723 Aneurysm of iliac artery: Secondary | ICD-10-CM

## 2021-08-17 MED ORDER — IOPAMIDOL (ISOVUE-370) INJECTION 76%
75.0000 mL | Freq: Once | INTRAVENOUS | Status: AC | PRN
  Administered 2021-08-17: 75 mL via INTRAVENOUS

## 2021-08-18 ENCOUNTER — Ambulatory Visit (INDEPENDENT_AMBULATORY_CARE_PROVIDER_SITE_OTHER): Payer: Medicare Other | Admitting: Vascular Surgery

## 2021-08-18 ENCOUNTER — Other Ambulatory Visit: Payer: Self-pay

## 2021-08-18 DIAGNOSIS — I723 Aneurysm of iliac artery: Secondary | ICD-10-CM

## 2021-08-18 NOTE — Progress Notes (Signed)
CT angiogram reviewed in detail.  Large right common iliac artery aneurysm which appears amenable to endovascular therapy.  Reviewed the scan with the [facility] clinical representatives.  I think straightforward EVAR would be appropriate with coil embolization of the right internal iliac artery and extension into the right external iliac artery.  We will plan to do this in the near future on an elective basis.  I reviewed the plan with the patient who is amenable.  Yevonne Aline. Stanford Breed, MD [facility] of [facility] Phone Number: [phone] 08/18/2021 5:39 PM

## 2021-08-20 ENCOUNTER — Other Ambulatory Visit: Payer: Self-pay

## 2021-08-20 DIAGNOSIS — I723 Aneurysm of iliac artery: Secondary | ICD-10-CM

## 2021-09-04 NOTE — Pre-Procedure Instructions (Signed)
Surgical Instructions ? ? ? Your procedure is scheduled on Thursday, March 9th. ? Report to [facility] Main Entrance "A" at 05:30 A.M., then check in with the Admitting office. ? Call this number if you have problems the morning of surgery: ? [phone] ? ? If you have any questions prior to your surgery date call [phone]: Open Monday-Friday 8am-4pm ? ? ? Remember: ? Do not eat or drink after midnight the night before your surgery ? ? ?  ? Take these medicines the morning of surgery with A SIP OF WATER  ?apixaban (ELIQUIS) ?Carboxymethylcellulose Sodium (LUBRICANT EYE DROPS OP)- if needed ? ? ?As of today, STOP taking any Aspirin (unless otherwise instructed by your surgeon) Aleve, Naproxen, Ibuprofen, Motrin, Advil, Goody's, BC's, all herbal medications, fish oil, and all vitamins. ?         ?           ?Do NOT Smoke (Tobacco/Vaping) for 24 hours prior to your procedure. ? ?If you use a CPAP at night, you may bring your mask/headgear for your overnight stay. ?  ?Contacts, glasses, piercing's, hearing aid's, dentures or partials may not be worn into surgery, please bring cases for these belongings.  ?  ?For patients admitted to the hospital, discharge time will be determined by your treatment team. ?  ?Patients discharged the day of surgery will not be allowed to drive home, and someone needs to stay with them for 24 hours. ? ?NO VISITORS WILL BE ALLOWED IN PRE-OP WHERE PATIENTS ARE PREPPED FOR SURGERY.  ONLY 1 SUPPORT PERSON MAY BE PRESENT IN THE WAITING ROOM WHILE YOU ARE IN SURGERY.  IF YOU ARE TO BE ADMITTED, ONCE YOU ARE IN YOUR ROOM YOU WILL BE ALLOWED TWO (2) VISITORS. (1) VISITOR MAY STAY OVERNIGHT BUT MUST ARRIVE TO THE ROOM BY 8pm.  Minor children may have two parents present. Special consideration for safety and communication needs will be reviewed on a case by case basis. ? ? ?Special instructions:   ?[place]- Preparing For Surgery ? ?Before surgery, you can play an important role. Because  skin is not sterile, your skin needs to be as free of germs as possible. You can reduce the number of germs on your skin by washing with CHG (chlorahexidine gluconate) Soap before surgery.  CHG is an antiseptic cleaner which kills germs and bonds with the skin to continue killing germs even after washing.   ? ?Oral Hygiene is also important to reduce your risk of infection.  Remember - BRUSH YOUR TEETH THE MORNING OF SURGERY WITH YOUR REGULAR TOOTHPASTE ? ?Please do not use if you have an allergy to CHG or antibacterial soaps. If your skin becomes reddened/irritated stop using the CHG.  ?Do not shave (including legs and underarms) for at least 48 hours prior to first CHG shower. It is OK to shave your face. ? ?Please follow these instructions carefully. ?  ?Shower the NIGHT BEFORE SURGERY and the MORNING OF SURGERY ? ?If you chose to wash your hair, wash your hair first as usual with your normal shampoo. ? ?After you shampoo, rinse your hair and body thoroughly to remove the shampoo. ? ?Use CHG Soap as you would any other liquid soap. You can apply CHG directly to the skin and wash gently with a scrungie or a clean washcloth.  ? ?Apply the CHG Soap to your body ONLY FROM THE NECK DOWN.  Do not use on open wounds or open sores. Avoid contact with your eyes,  ears, mouth and genitals (private parts). Wash Face and genitals (private parts)  with your normal soap.  ? ?Wash thoroughly, paying special attention to the area where your surgery will be performed. ? ?Thoroughly rinse your body with warm water from the neck down. ? ?DO NOT shower/wash with your normal soap after using and rinsing off the CHG Soap. ? ?Pat yourself dry with a CLEAN TOWEL. ? ?Wear CLEAN PAJAMAS to bed the night before surgery ? ?Place CLEAN SHEETS on your bed the night before your surgery ? ?DO NOT SLEEP WITH PETS. ? ? ?Day of Surgery: ?Take a shower with CHG soap. ?Do not wear jewelry  ?Do not wear lotions, powders, colognes, or deodorant. ?Do  not shave 48 hours prior to surgery.  Men may shave face and neck. ?Do not bring valuables to the hospital. [facility] is not responsible for any belongings or valuables. ?Wear Clean/Comfortable clothing the morning of surgery ?Do not apply any deodorants/lotions.   ?Remember to brush your teeth WITH YOUR REGULAR TOOTHPASTE. ?  ?Please read over the following fact sheets that you were given. ? ? ?3 days prior to your procedure or After your COVID test ? ?? You are not required to quarantine however you are required to wear a well-fitting mask when you are out and around people not in your household. If your mask becomes wet or soiled, replace with a new one. ? ?? Wash your hands often with soap and water for 20 seconds or clean your hands with an alcohol-based hand sanitizer that contains at least 60% alcohol. ? ?? Do not share personal items. ? ?? Notify your provider: ? ?o if you are in close contact with someone who has COVID ? ?o or if you develop a fever of 100.4 or greater, sneezing, cough, sore throat, shortness of breath or body aches.   ?

## 2021-09-07 ENCOUNTER — Encounter (HOSPITAL_COMMUNITY): Payer: Self-pay | Admitting: Physician Assistant

## 2021-09-07 ENCOUNTER — Encounter (HOSPITAL_COMMUNITY)
Admission: RE | Admit: 2021-09-07 | Discharge: 2021-09-07 | Disposition: A | Discharge status: Home / Self Care | Payer: Medicare Other | Source: Ambulatory Visit | Attending: Vascular Surgery | Admitting: Vascular Surgery

## 2021-09-07 ENCOUNTER — Other Ambulatory Visit: Payer: Self-pay

## 2021-09-07 ENCOUNTER — Encounter (HOSPITAL_COMMUNITY): Payer: Self-pay

## 2021-09-07 ENCOUNTER — Telehealth: Payer: Self-pay

## 2021-09-07 VITALS — BP 160/100 | HR 96 | Temp 98.2°F | Resp 18 | Ht 72.0 in | Wt 251.0 lb

## 2021-09-07 DIAGNOSIS — Z01818 Encounter for other preprocedural examination: Secondary | ICD-10-CM | POA: Diagnosis not present

## 2021-09-07 DIAGNOSIS — I723 Aneurysm of iliac artery: Secondary | ICD-10-CM | POA: Insufficient documentation

## 2021-09-07 DIAGNOSIS — Z20822 Contact with and (suspected) exposure to covid-19: Secondary | ICD-10-CM | POA: Diagnosis not present

## 2021-09-07 DIAGNOSIS — Z7901 Long term (current) use of anticoagulants: Secondary | ICD-10-CM | POA: Diagnosis not present

## 2021-09-07 LAB — COMPREHENSIVE METABOLIC PANEL
ALT: 17 U/L (ref 0–44)
AST: 24 U/L (ref 15–41)
Albumin: 4 g/dL (ref 3.5–5.0)
Alkaline Phosphatase: 74 U/L (ref 38–126)
Anion gap: 10 (ref 5–15)
BUN: 20 mg/dL (ref 8–23)
CO2: 25 mmol/L (ref 22–32)
Calcium: 8.9 mg/dL (ref 8.9–10.3)
Chloride: 104 mmol/L (ref 98–111)
Creatinine, Ser: 0.99 mg/dL (ref 0.61–1.24)
GFR, Estimated: >60 mL/min (ref >60)
Glucose, Bld: 151 mg/dL — ABNORMAL HIGH (ref 70–99)
Potassium: 3.6 mmol/L (ref 3.5–5.1)
Sodium: 139 mmol/L (ref 135–145)
Total Bilirubin: 0.6 mg/dL (ref 0.3–1.2)
Total Protein: 7.5 g/dL (ref 6.5–8.1)

## 2021-09-07 LAB — URINALYSIS, ROUTINE W REFLEX MICROSCOPIC
Bilirubin Urine: NEGATIVE
Glucose, UA: NEGATIVE mg/dL
Hgb urine dipstick: NEGATIVE
Ketones, ur: NEGATIVE mg/dL
Leukocytes,Ua: NEGATIVE
Nitrite: NEGATIVE
Protein, ur: NEGATIVE mg/dL
Specific Gravity, Urine: 1.018 (ref 1.005–1.030)
pH: 5 (ref 5.0–8.0)

## 2021-09-07 LAB — APTT: aPTT: 38 seconds — ABNORMAL HIGH (ref 24–36)

## 2021-09-07 LAB — CBC
HCT: 43.9 % (ref 39.0–52.0)
Hemoglobin: 14 g/dL (ref 13.0–17.0)
MCH: 29.2 pg (ref 26.0–34.0)
MCHC: 31.9 g/dL (ref 30.0–36.0)
MCV: 91.6 fL (ref 80.0–100.0)
Platelets: 200 10*3/uL (ref 150–400)
RBC: 4.79 MIL/uL (ref 4.22–5.81)
RDW: 13.7 % (ref 11.5–15.5)
WBC: 6.9 10*3/uL (ref 4.0–10.5)
nRBC: 0 % (ref 0.0–0.2)

## 2021-09-07 LAB — SURGICAL PCR SCREEN
MRSA, PCR: NEGATIVE
Staphylococcus aureus: NEGATIVE

## 2021-09-07 LAB — PROTIME-INR
INR: 1.2 (ref 0.8–1.2)
Prothrombin Time: 15.4 seconds — ABNORMAL HIGH (ref 11.4–15.2)

## 2021-09-07 LAB — SARS CORONAVIRUS 2 (TAT 6-24 HRS): SARS Coronavirus 2: NEGATIVE

## 2021-09-07 NOTE — Telephone Encounter (Signed)
I have attempted to contact this patient by phone, but no answer and mailbox is full. Tried to attempted to call pt's daughter, Amy (DPR on file) but no answer.  I will continue to try later. ? ?

## 2021-09-07 NOTE — Telephone Encounter (Signed)
Primary Cardiologist:Michael Burt Knack, MD ? ?Chart reviewed as part of pre-operative protocol coverage. Because of Aundrey Garson's past medical history and time since last visit, he/she will require a follow-up visit in order to better assess preoperative cardiovascular risk. ? ?Pre-op covering staff: ?- Please schedule appointment and call patient to inform them.   ?- Please contact requesting surgeon's office via preferred method (i.e, phone, fax) to inform them of need for appointment prior to surgery. ? ?If applicable, this message will also be routed to pharmacy pool and/or primary cardiologist for input on holding anticoagulant/antiplatelet agent as requested below so that this information is available at time of patient's appointment.  ? ?Emmaline Life, NP-C ? ?  ?09/07/2021, 3:18 PM ?[facility] ?[address] ?Office [phone] Fax [phone] ? ?

## 2021-09-07 NOTE — Progress Notes (Signed)
PCP - Dr. [name] ?Cardiologist - Dr. Sherren Mocha ? ?PPM/ICD - denies ? ? ?Chest x-ray - 04/22/19 ?EKG - 06/23/20 ?Stress Test - 11/27/14 ?ECHO - 11/04/20 ?Cardiac Cath - 04/25/19 ? ?Sleep Study - denies ? ? ?DM- denies ? ?Blood Thinner Instructions: pt instructed to hold Eliquis for 3 days prior to procedure ?Aspirin Instructions: n/a ? ?ERAS Protcol - no, NPO ? ? ?COVID TEST- 09/07/21 at PAT ? ? ?Anesthesia review: yes, cardiac hx ? ?Patient denies shortness of breath, fever, cough and chest pain at PAT appointment ? ? ?All instructions explained to the patient, with a verbal understanding of the material. Patient agrees to go over the instructions while at home for a better understanding. Patient also instructed to wear a mask in public after being tested for COVID-19. The opportunity to ask questions was provided. ?  ?

## 2021-09-07 NOTE — Telephone Encounter (Signed)
? ?  Pre-operative Risk Assessment  ?  ?Patient Name: Ewel Lona  ?DOB: [birth year] ?MRN: [id number]  ? ?  ? ?Request for Surgical Clearance   ? ?Procedure:   ABDOMINAL AORTIC STENT GRAFT REPAIR ? ?Date of Surgery:  Clearance 09/10/21                              ?   ?Surgeon:  DR. Jamelle Haring ?Surgeon's Group or Practice Name:  [facility] ?Phone number:  [phone] ?Fax number:  [phone] ?  ?Type of Clearance Requested:   ?- Medical  ?  ?Type of Anesthesia:  Not Indicated ?  ?Additional requests/questions:   ? ?Signed, ?Jacinta Shoe   ?09/07/2021, 2:38 PM   ?

## 2021-09-08 ENCOUNTER — Telehealth: Payer: Self-pay

## 2021-09-08 NOTE — Telephone Encounter (Signed)
Spoke with patient and informed of recommendations. He verbalized understanding and will contact Dr. Antionette Char office to schedule appointment. Will reschedule EVAR surgery once patient cleared by cardiology.  ?

## 2021-09-08 NOTE — Telephone Encounter (Signed)
Left very detailed message for pt's daughter that the pt is going to need an appt for pre op clearance. I was going to offer on DOD scheduled tomorrow with Dr. Johnsie Cancel 1:30 7 day hold. I will update the requesting office that we have been unable to reach the pt .  ?

## 2021-09-08 NOTE — Telephone Encounter (Signed)
-----   Message from Cherre Robins, MD sent at 09/07/2021  7:54 PM EST ----- ?Regarding: RE: FYI ?OK - no worries. Let's make sure he's up to date with Dr. Burt Knack and reschedule whenever is convenient. Thanks! ? ?Tom ?----- Message ----- ?From: Nicholas Lose, RN ?Sent: 09/07/2021   4:51 PM EST ?To: Cherre Robins, MD ?Subject: FYI                                           ? ?Dr. Stanford Breed ? ?FYI- This EVAR most likely will be canceled.  ? ?I received a call from the pre-admit anesthesia department and the PA had concerns about patient having uncontrolled blood pressure and unsure if he is compliant with medications and he is also overdue for his cardiology follow up with Dr. Burt Knack. I sent a clearance to Dr. Burt Knack as suggested and they have been unable to reach patient today to sched an appointment, just as I have because they also mentioned him confused about directions on Eliquis, which he should've stopped yesterday.  ? ?Herma Ard ? ? ?

## 2021-09-09 NOTE — Telephone Encounter (Signed)
Attempted to call daughter, Amy, DPR on file, left a detailed message for her to call back.  ?

## 2021-09-09 NOTE — Telephone Encounter (Signed)
Patient's daughter returning call. States she is at work and the call may be returned to the patient. ?

## 2021-09-09 NOTE — Telephone Encounter (Signed)
Returned the call to the pt, voicemail is full and couldn't leave a message. ?

## 2021-09-10 ENCOUNTER — Inpatient Hospital Stay (HOSPITAL_COMMUNITY): Admission: RE | Admit: 2021-09-10 | Payer: Medicare Other | Source: Home / Self Care | Admitting: Vascular Surgery

## 2021-09-10 ENCOUNTER — Encounter (HOSPITAL_COMMUNITY): Admission: RE | Payer: Self-pay | Source: Home / Self Care

## 2021-09-10 SURGERY — INSERTION, ENDOVASCULAR STENT GRAFT, AORTA, ABDOMINAL
Anesthesia: General | Laterality: Right

## 2021-09-10 NOTE — Telephone Encounter (Signed)
Unable to reach patient. I called 5 times and phone goes straight to voicemail. I left a message on friend and daughters voicemail advising them to have the patient contact the office. I spoke with [name] and she states the patient has been trying to call us back since Monday but can not get through. She states the hold time is long and when the patient chooses the option to leave his phone number and get a call back he never gets a call back.  ?

## 2021-09-11 NOTE — Telephone Encounter (Signed)
I was able to s/w [name]'s daughter [name]. I apologized that it has been very difficult to get through to our phone line, as well as we have not been able to reach [name] on his phone. [name] called her dad,([name]), we did a 3 way call and I s/w [name] and have scheduled him an appt with [name], [facility] 09/16/21 @ 2:20 for pre op clearance. Mullins and his daughter are grateful for Calvin help and Calvin call . I will forward notes to [facility] for upcoming appt. Will send FYI to requesting office [name] has appt 09/16/21.  ?

## 2021-09-15 ENCOUNTER — Ambulatory Visit: Payer: Medicare Other | Admitting: Physician Assistant

## 2021-09-15 LAB — TYPE AND SCREEN
ABO/RH(D): A NEG
Antibody Screen: POSITIVE
Donor AG Type: NEGATIVE
Donor AG Type: NEGATIVE
PT AG Type: NEGATIVE
Unit division: 0
Unit division: 0

## 2021-09-15 LAB — BPAM RBC
Blood Product Expiration Date: 202303292359
Blood Product Expiration Date: 202303292359
Unit Type and Rh: 600
Unit Type and Rh: 600

## 2021-09-16 ENCOUNTER — Ambulatory Visit: Payer: Medicare Other | Admitting: Physician Assistant

## 2021-09-16 ENCOUNTER — Encounter: Payer: Self-pay | Admitting: Physician Assistant

## 2021-09-16 ENCOUNTER — Other Ambulatory Visit: Payer: Self-pay

## 2021-09-16 VITALS — BP 132/78 | HR 90 | Ht 72.0 in | Wt 251.0 lb

## 2021-09-16 DIAGNOSIS — Z01810 Encounter for preprocedural cardiovascular examination: Secondary | ICD-10-CM | POA: Diagnosis not present

## 2021-09-16 DIAGNOSIS — I340 Nonrheumatic mitral (valve) insufficiency: Secondary | ICD-10-CM

## 2021-09-16 DIAGNOSIS — I2510 Atherosclerotic heart disease of native coronary artery without angina pectoris: Secondary | ICD-10-CM | POA: Insufficient documentation

## 2021-09-16 DIAGNOSIS — I77810 Thoracic aortic ectasia: Secondary | ICD-10-CM | POA: Insufficient documentation

## 2021-09-16 DIAGNOSIS — N1831 Chronic kidney disease, stage 3a: Secondary | ICD-10-CM

## 2021-09-16 DIAGNOSIS — I480 Paroxysmal atrial fibrillation: Secondary | ICD-10-CM | POA: Diagnosis not present

## 2021-09-16 DIAGNOSIS — I5032 Chronic diastolic (congestive) heart failure: Secondary | ICD-10-CM | POA: Insufficient documentation

## 2021-09-16 DIAGNOSIS — I10 Essential (primary) hypertension: Secondary | ICD-10-CM

## 2021-09-16 DIAGNOSIS — I5020 Unspecified systolic (congestive) heart failure: Secondary | ICD-10-CM | POA: Insufficient documentation

## 2021-09-16 HISTORY — DX: Thoracic aortic ectasia: I77.810

## 2021-09-16 MED ORDER — METOPROLOL SUCCINATE ER 50 MG PO TB24: 50.0000 mg | ORAL_TABLET | Freq: Every day | ORAL | 3 refills | Status: DC

## 2021-09-16 NOTE — Assessment & Plan Note (Signed)
Fair control. He notes that his BP was markedly elevated at one of his appts preparing for his aneurysm surgery.  He admits he was out of Entresto at the time.  Continue Entresto 97/103 mg twice daily.  Resume Toprol XL 50 mg once daily.   ?

## 2021-09-16 NOTE — Assessment & Plan Note (Signed)
Mod to severe MR by echocardiogram in 5/22.  He is not having symptoms related to his MR.  I will get a f/u echocardiogram prior to providing surgical clearance.   ?

## 2021-09-16 NOTE — Assessment & Plan Note (Signed)
43 mm on echocardiogram in 5/22.  Repeat echocardiogram will be obtained as outlined.   ?

## 2021-09-16 NOTE — Patient Instructions (Addendum)
Medication Instructions:  ?Your physician has recommended you make the following change in your medication:  ? START Toprol Xl 50 mg taking 1 daily  ? ? ?*If you need a refill on your cardiac medications before your next appointment, please call your pharmacy* ? ? ?Lab Work: ?TODAY:  BMET & CBC  ? ?If you have labs (blood work) drawn today and your tests are completely normal, you will receive your results only by: ?MyChart Message (if you have MyChart) OR ?A paper copy in the mail ?If you have any lab test that is abnormal or we need to change your treatment, we will call you to review the results. ? ? ?Testing/Procedures: ?Your physician has requested that you have an echocardiogram.  Echocardiography is a painless test that uses sound waves to create images of your heart. It provides your doctor with information about the size and shape of your heart and how well your heart?s chambers and valves are working. This procedure takes approximately one hour. There are no restrictions for this procedure. ? ? ?Your physician has requested that you have a lexiscan myoview. For further information please visit [email or web address]. Please follow instruction sheet, BELOW: ? ? ? ?You are scheduled for a Myocardial Perfusion Imaging Study  ?Please arrive 15 minutes prior to your appointment time for registration and insurance purposes. ? ?The test will take approximately 3 to 4 hours to complete; you may bring reading material.  If someone comes with you to your appointment, they will need to remain in the main lobby due to limited space in the testing area. **If you are pregnant or breastfeeding, please notify the nuclear lab prior to your appointment** ? ?How to prepare for your Myocardial Perfusion Test: ?Do not eat or drink 3 hours prior to your test, except you may have water. ?Do not consume products containing caffeine (regular or decaffeinated) 12 hours prior to your test. (ex: coffee, chocolate, sodas, tea). ?Do bring  a list of your current medications with you.  If not listed below, you may take your medications as normal. ?Do wear comfortable clothes (no dresses or overalls) and walking shoes, tennis shoes preferred (No heels or open toe shoes are allowed). ?Do NOT wear cologne, perfume, aftershave, or lotions (deodorant is allowed). ?If these instructions are not followed, your test will have to be rescheduled. ? ? ? ?Follow-Up: ?At [facility], you and your health needs are our priority.  As part of our continuing mission to provide you with exceptional heart care, we have created designated Provider Care Teams.  These Care Teams include your primary Cardiologist (physician) and Advanced Practice Providers (APPs -  Physician Assistants and Nurse Practitioners) who all work together to provide you with the care you need, when you need it. ? ?We recommend signing up for the patient portal called "MyChart".  Sign up information is provided on this After Visit Summary.  MyChart is used to connect with patients for Virtual Visits (Telemedicine).  Patients are able to view lab/test results, encounter notes, upcoming appointments, etc.  Non-urgent messages can be sent to your provider as well.   ?To learn more about what you can do with MyChart, go to [email or web address].   ? ?Your next appointment:   ?6 month(s) ? ?The format for your next appointment:   ?In Person ? ?Provider:   ?Sherren Mocha, MD  or [name], PA-C       ? ? ?Other Instructions ? ?

## 2021-09-16 NOTE — Assessment & Plan Note (Signed)
Non-obstructive CAD by cath in 04/2019.  He is not having symptoms to suggest angina.  However, he needs surgical clearance for vascular surgery.  As noted, I will obtain a nuclear stress test given the uncertainly of whether or not he can achieve 4 METs.  He is not on ASA as he is on Eliquis.  He stopped taking his other meds.  I will try to convince him to restart statin Rx at his f/u OV.   ?

## 2021-09-16 NOTE — Progress Notes (Signed)
?Cardiology Office Note:   ? ?Date:  09/16/2021  ? ?ID:  Jlen Wintle, DOB [birth year], MRN [id number] ? ?PCP:  [name], MD  ?[facility] Providers ?Cardiologist:  Sherren Mocha, MD ?Cardiology APP:  Liliane Shi, PA-C  ?Electrophysiologist:  Thompson Grayer, MD     ?Referring MD: [name], MD  ? ?Chief Complaint:  Surgical Clearance ?  ? ?Patient Profile: ?Hx of syncope ?Vasovagal (Admx 10/2018) ?NSVT  ?(HFrEF) heart failure with reduced ejection fraction (imp to normal) ?Hx of tachy mediated CM in 12/19 (AF w RVR) >> EF returned to normal in NSR ?Non-ischemic cardiomyopathy  ?Echocardiogram 04/2019: EF 35-40 ?Cath w mod non-obs CAD ?Echocardiogram 5/22: EF 55-60  ?Coronary artery disease  ?Cath 04/2019: mod non-obstructive CAD  ?Persistent atrial fibrillation  ?CHA2DS2-VASc Score = 8 [CHF History: 1, HTN History: 1, Diabetes History: 1, Stroke History: 2, Vascular Disease History: 1, Age Score: 2, Gender Score: 0].    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 Burt Knack - med Rx if remains asymptomatic) ?Pulmonary HTN  ?Diabetes mellitus  ?Hypertension  ?Hyperlipidemia  ?Chronic kidney disease  ?Prior CVA  ?OSA  ?Prostate CA  ?Dementia  ?RBBB  ? ?Prior CV Studies: ?Echocardiogram 11/04/20 ?EF 55-60, no RWMA, Gr 1 DD, mod to severe MR, normal RVSF, RVSP 35.9, mild LAE, mild AI, AV sclerosis w/o AS, ascending aorta 43 mm (mild dilation) ? ?Zio Cardiac Monitor 07/2020 ?NSR, avg HR 68, occ PVCs (1.8%), rare supraventricular beats, runs (HR 105) ?  ?Cardiac catheterization 04/25/2019 ?LM ost 20 ?LAD calcified, dist 55; D2 55 ?LCx ost 30, prox 25, mid 35; OM1 60 ?RCA irregs ?  ?Echocardiogram 04/23/2019 ?EF 35-40, diff HK worse in inf wall, normla RVSF, mild to mod MR, mod TR, mild AI, mild to mod AV  sclerosis (no AS), mild PI, RVSP 52.6 ?  ?Echo 09/07/2018 ?EF 50-55, moderate LVH, normal diastolic function, moderate LAE, moderate MAC, moderate MR, mild to moderate AI ?  ?TEE 06/19/2018 ?- 1. No LA thrombus, may proceed to DCCV. ?  2. Severe LV dilation with EF 25%, diffuse hypokinesis. ?  3. Mild RV dilation with moderate systolic dysfunction. ?  4. Severe MR with restriction of posterior leaflet and evidence ?  for a ruptured chord. ?  ?Echocardiogram 06/17/2018 ?EF 20, severe MR, mild LAE, moderate TR, PASP 58 ?  ?Echocardiogram 04/29/2018 ?EF 55-60, moderate MR, PASP 75 ?  ?Myoview 11/27/2014 ?EF 50, no ischemia ?  ?Event monitor 10/24/2014 ?Sinus rhythm, PVCs, NSVT ?  ?Echocardiogram 10/22/2014 ?EF 16-60, grade 1 diastolic dysfunction, mild MR ?  ?Carotid US 10/22/2014 ?Bilateral ICA 1-39 ?  ? ?History of Present Illness:   ?Julia Kulzer is a 87 y.o. male with the above problem list.  He was last seen in 3/22.  He returns for surgical clearance.  He needs R CIA repair w EVAR with Dr. Stanford Breed.  Unfortunately, he stopped most of his medications again.  He is now only on Entresto and Eliquis.  He stopped Toprol XL, Spironolactone, Crestor.   He denies chest pain, shortness of breath, syncope, orthopnea, PND or significant pedal edema.      ?   ?Past Medical History:  ?Diagnosis Date  ? (HFimpEF) heart failure with improved EF   ? Echo 04/2019: EF 35-40 //  Echo 5/22: EF 55-60, no RWMA, GR 1 DD, moderate to severe MR, myxomatous mitral valve, similar to prior echocardiogram, mildly elevated PASP, RVSP 35.9, mild AI, mild AV sclerosis without AS, mild dilation of ascending aorta (43 mm)  ? A-fib ([place])   ? RVR-DCCV  ? Abnormal PSA 01/17/2012  ? 10.38/4/5/ PSA:2013=6.96/ PSA 07/08/2011=6.26/ PSA 2006=8.6  ? Aneurysm ([place])   ? 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 Blood transfusion without reported diagnosis   ? during vagotomy 1976  ? Cancer [facility]) 2006  ? L-2 solitary metastatic lesion/no rad tx ,asymptomatic  ? Chronic  kidney disease   ? nephrolithiasis right kidney  ? ED (erectile dysfunction)   ? GERD (gastroesophageal reflux disease)   ? PUD S/P PARTIAL VAGOTOMY  ? Gynecomastia 04/21/2011  ? seen by Dr.Murray, no rad tx  ? H/O impacted cerumen   ? S/P ENT DR BATES  ? History of echocardiogram   ? echo 4/16:  mild LVH, EF 55-60%, Gr 1 DD, no RWMA, mild AI, mild MR, mod LAE, mild TR  ? Hx of cardiovascular stress test   ? Myoview 5/16:  EF 50%, no scar or ischemia, low risk  ? Hydroureteronephrosis   ? right   ? Hyperglycemia   ? WITHOUT DM2  ? Hyperlipidemia   ? Hypertension   ? Insomnia   ? Migraines   ? WITH AURA  ? Nephrolithiasis   ? Osteopenia   ? Overweight   ? Prostate cancer ([place]) 12/09/03 dx  ? Prostate ca/adenocarcinoma,gleason=3+3=6    pSA 4.6  ? Prostate cancer ([place])   ? METASTATIC WITH L2 SCLEROTIC LESION ON LUPRON------DR. WOODRUFF  ? RBBB (right bundle branch block)   ? Rosacea   ? Sinus bradycardia   ? Sleep apnea   ? pt unaware, no sleep study  ? Stroke [facility])   ? asymptomatic, found on head imaging  ? Ulcer   ? peptic  ? Use of leuprolide acetate (Lupron) 06/2005-11/2006  ? 02/2008 degarelix 02/2008 x 3 doses  ? ?Current Medications: ?Current Meds  ?Medication Sig  ? apixaban (ELIQUIS) 5 MG TABS tablet Take 1 tablet (5 mg total) by mouth 2 (two) times daily.  ? Calcium Carb-Cholecalciferol (CALCIUM 600 + D PO) Take 1 tablet by mouth every other day.  ? Carboxymethylcellulose Sodium (LUBRICANT EYE DROPS OP) Place 1 drop into both eyes daily as needed (dry eyes).  ? furosemide (LASIX) 20 MG tablet Take 1 tablet (20 mg total) by mouth daily. Please make yearly appt with Dr. Burt Knack for March 2023 for future refills. Thank you 1st attempt  ? metoprolol succinate (TOPROL-XL) 50 MG 24 hr tablet Take 1 tablet (50 mg total) by mouth daily. Take with or immediately following a meal.  ? sacubitril-valsartan (ENTRESTO) 97-103 MG Take 1 tablet by mouth 2 (two) times daily.  ?  ?Allergies:   Aspirin  ? ?Social History  ? ?Tobacco  Use  ? Smoking status: Former  ?  Types: Pipe  ?  Quit date: 07/05/1984  ?  Years since quitting: 37.2  ? Smokeless tobacco: Never  ?Vaping Use  ? Vaping Use: Never used  ?Substance Use Topics  ? Alcohol use: No  ? Drug use: No  ?  ?Family Hx: ?The patient's family history includes Kidney Stones in his father; Liver cancer in his father. There is no history of Colon cancer or Stroke. ? ?Review of Systems  ?Gastrointestinal:  Negative for hematochezia.  ?Genitourinary:  Negative for hematuria.   ? ?  EKGs/Labs/Other Test Reviewed:   ? ?EKG:  EKG is  ordered today.  The ekg ordered today demonstrates NSR, HR 90, LAD, Right Bundle Branch Block, QTc 516 ? ?Recent Labs: ?09/07/2021: ALT 17; BUN 20; Creatinine, Ser 0.99; Hemoglobin 14.0; Platelets 200; Potassium 3.6; Sodium 139  ? ?Recent Lipid Panel ?No results for input(s): CHOL, TRIG, HDL, VLDL, LDLCALC, LDLDIRECT in the last 8760 hours.  ? ?Risk Assessment/Calculations:   ? ?CHA2DS2-VASc Score = 8  ? This indicates a 10.8% annual risk of stroke. ?The patient's score is based upon: ?CHF History: 1 ?HTN History: 1 ?Diabetes History: 1 ?Stroke History: 2 ?Vascular Disease History: 1 ?Age Score: 2 ?Gender Score: 0 ?  ? ?    ?Physical Exam:   ? ?VS:  BP 132/78 (BP Location: Right Arm, Patient Position: Sitting, Cuff Size: Normal)   Pulse 90   Ht 6' (1.829 m)   Wt 251 lb (113.9 kg)   SpO2 95%   BMI 34.04 kg/m?    ? ?Wt Readings from Last 3 Encounters:  ?09/16/21 251 lb (113.9 kg)  ?09/07/21 251 lb (113.9 kg)  ?08/04/21 254 lb (115.2 kg)  ?  ?Constitutional:   ?   Appearance: Healthy appearance. Not in distress.  ?Neck:  ?   Vascular: No JVR. JVD normal.  ?Pulmonary:  ?   Effort: Pulmonary effort is normal.  ?   Breath sounds: No wheezing. No rales.  ?Cardiovascular:  ?   Normal rate. Regular rhythm. Normal S1. Normal S2.   ?   Murmurs: There is a grade 2/6 systolic murmur at the LLSB.  ?Edema: ?   Peripheral edema present. ?   Pretibial: bilateral trace edema of the  pretibial area. ?Abdominal:  ?   Palpations: Abdomen is soft.  ?Skin: ?   General: Skin is warm and dry.  ?Neurological:  ?   General: No focal deficit present.  ?   Mental Status: Alert and oriented to person, place and ti

## 2021-09-16 NOTE — Assessment & Plan Note (Signed)
Obtain f/u BMET.   ?

## 2021-09-16 NOTE — Assessment & Plan Note (Addendum)
Maintaining NSR.  Creatinine and Hgb recently were normal.  Luckily, he is still taking Eliquis.  I reviewed his case with one of our PharmDs.  Ideally we recommend holding the Eliquis for 1 day prior to surgery and resuming it as soon as it is safe, post op. If the bleeding risk is too great, we may need to bridge him with Lovenox.  CHA2DS2-VASc Score = 8 [CHF History: 1, HTN History: 1, Diabetes History: 1, Stroke History: 2, Vascular Disease History: 1, Age Score: 2, Gender Score: 0].  Therefore, the patient's annual risk of stroke is 10.8 %.  Check BMET, CBC today.   ?

## 2021-09-16 NOTE — Assessment & Plan Note (Signed)
He has again stopped most of his medications.  With taking 3 of the 4 pillars of GDMT for heart failure (Spironolactone, Metoprolol succinate and Entresto) his EF returned to normal by echocardiogram in 5/22.  Unfortunately, he stopped the Metoprolol and Spironolactone.  Luckily he is not describing symptoms of volume excess and his exam suggests euvolemic status.  At the very least, we should get him back on Metoprolol succinate.   ?? Continue Entresto 97/103 mg twice daily ?? Restart Metoprolol succinate 50 mg once daily  ?? Arrange f/u echocardiogram  ?? F/u 6 mos. ?

## 2021-09-16 NOTE — Assessment & Plan Note (Addendum)
[name]'s perioperative risk of a major cardiac event is 11% according to the Revised Cardiac Risk Index (RCRI).  Therefore, he is at high risk for perioperative complications.  He describes a functional capacity of > 4 METs.  However, he appears weak getting up from the chair to the exam table today.  I am concerned that his functional capacity is actually < 4 METs.  Given the need for vascular surgery and his hx of mod to severe MR, I feel he needs to undergo stress testing and f/u echocardiogram prior to providing recommendations for surgical risk.  He is at high risk for stroke off of anticoagulation and the time off of his Eliquis should be limited.   ?Recommendations: ?According to ACC/AHA guidelines, the patient will require stress testing for further risk stratification. ?The patient requires an echocardiogram before a disposition can be made regarding surgical risk.                ?Antiplatelet and/or Anticoagulation Recommendations: ?Eliquis (Apixaban) can be held for 1 day prior to surgery.  If it needs to be held longer than this b/c of bleeding risk, we will need to consider bridging therapy with Lovenox.  Please resume post op when felt to be safe.   ?

## 2021-09-17 LAB — BASIC METABOLIC PANEL
BUN/Creatinine Ratio: 15 (ref 10–24)
BUN: 17 mg/dL (ref 8–27)
CO2: 25 mmol/L (ref 20–29)
Calcium: 9.5 mg/dL (ref 8.6–10.2)
Chloride: 102 mmol/L (ref 96–106)
Creatinine, Ser: 1.11 mg/dL (ref 0.76–1.27)
Glucose: 145 mg/dL — ABNORMAL HIGH (ref 70–99)
Potassium: 4.1 mmol/L (ref 3.5–5.2)
Sodium: 141 mmol/L (ref 134–144)
eGFR: 65 mL/min/{1.73_m2} (ref >59)

## 2021-09-17 LAB — CBC
Hematocrit: 44.3 % (ref 37.5–51.0)
Hemoglobin: 14.6 g/dL (ref 13.0–17.7)
MCH: 29.3 pg (ref 26.6–33.0)
MCHC: 33 g/dL (ref 31.5–35.7)
MCV: 89 fL (ref 79–97)
Platelets: 198 10*3/uL (ref 150–450)
RBC: 4.98 x10E6/uL (ref 4.14–5.80)
RDW: 13.5 % (ref 11.6–15.4)
WBC: 7 10*3/uL (ref 3.4–10.8)

## 2021-09-17 NOTE — Telephone Encounter (Signed)
His functional status does not seem good and he has mod to severe MR.  I am repeating his echocardiogram and getting a stress test.  I will follow up regarding his clearance once test results are back. ?He has a high risk for stroke.  Are you ok holding Eliquis just 1 day prior to surgery? ?Thanks, ?Debie Ashline ? ?[name], PA-C    ?09/17/2021 9:15 AM   ?

## 2021-09-25 ENCOUNTER — Other Ambulatory Visit: Payer: Self-pay | Admitting: Physician Assistant

## 2021-09-25 NOTE — Telephone Encounter (Signed)
Prescription refill request for Eliquis received. ?Indication: Afib  ?Last office visit: 09/16/21 Kathlen Mody) ?Scr: 1.11 (09/16/21) ?Age: 87 ?Weight: 113.9kg ? ?Appropriate dose and refill sent to requested pharmacy.  ?

## 2021-09-29 ENCOUNTER — Telehealth (HOSPITAL_COMMUNITY): Payer: Self-pay

## 2021-09-29 NOTE — Telephone Encounter (Signed)
Detailed instructions left on the patient's answering machine. Asked to call back with any questions. S.Marcus Groll EMTP 

## 2021-09-30 ENCOUNTER — Ambulatory Visit (HOSPITAL_COMMUNITY): Payer: Medicare Other | Attending: Cardiology

## 2021-09-30 DIAGNOSIS — I480 Paroxysmal atrial fibrillation: Secondary | ICD-10-CM | POA: Insufficient documentation

## 2021-09-30 DIAGNOSIS — I10 Essential (primary) hypertension: Secondary | ICD-10-CM | POA: Insufficient documentation

## 2021-09-30 DIAGNOSIS — N1831 Chronic kidney disease, stage 3a: Secondary | ICD-10-CM | POA: Diagnosis present

## 2021-09-30 DIAGNOSIS — Z01810 Encounter for preprocedural cardiovascular examination: Secondary | ICD-10-CM | POA: Insufficient documentation

## 2021-09-30 DIAGNOSIS — I2510 Atherosclerotic heart disease of native coronary artery without angina pectoris: Secondary | ICD-10-CM | POA: Diagnosis present

## 2021-09-30 DIAGNOSIS — I5020 Unspecified systolic (congestive) heart failure: Secondary | ICD-10-CM | POA: Diagnosis present

## 2021-09-30 DIAGNOSIS — I340 Nonrheumatic mitral (valve) insufficiency: Secondary | ICD-10-CM | POA: Insufficient documentation

## 2021-09-30 LAB — ECHOCARDIOGRAM COMPLETE
Area-P 1/2: 2.86 cm2
MV M vel: 4.81 m/s
MV Peak grad: 92.5 mmHg
P 1/2 time: 515 msec
Radius: 0.45 cm
S' Lateral: 3.4 cm

## 2021-10-01 ENCOUNTER — Ambulatory Visit (HOSPITAL_COMMUNITY): Payer: Medicare Other | Attending: Cardiology

## 2021-10-01 ENCOUNTER — Encounter: Payer: Self-pay | Admitting: Physician Assistant

## 2021-10-01 DIAGNOSIS — N1831 Chronic kidney disease, stage 3a: Secondary | ICD-10-CM | POA: Insufficient documentation

## 2021-10-01 DIAGNOSIS — I10 Essential (primary) hypertension: Secondary | ICD-10-CM | POA: Diagnosis present

## 2021-10-01 DIAGNOSIS — Z01810 Encounter for preprocedural cardiovascular examination: Secondary | ICD-10-CM | POA: Diagnosis not present

## 2021-10-01 DIAGNOSIS — I480 Paroxysmal atrial fibrillation: Secondary | ICD-10-CM | POA: Insufficient documentation

## 2021-10-01 DIAGNOSIS — I340 Nonrheumatic mitral (valve) insufficiency: Secondary | ICD-10-CM | POA: Diagnosis present

## 2021-10-01 DIAGNOSIS — I2510 Atherosclerotic heart disease of native coronary artery without angina pectoris: Secondary | ICD-10-CM | POA: Diagnosis not present

## 2021-10-01 DIAGNOSIS — I5020 Unspecified systolic (congestive) heart failure: Secondary | ICD-10-CM | POA: Insufficient documentation

## 2021-10-01 DIAGNOSIS — I351 Nonrheumatic aortic (valve) insufficiency: Secondary | ICD-10-CM | POA: Insufficient documentation

## 2021-10-01 HISTORY — DX: Nonrheumatic aortic (valve) insufficiency: I35.1

## 2021-10-01 LAB — MYOCARDIAL PERFUSION IMAGING
LV dias vol: 153 mL (ref 62–150)
LV sys vol: 90 mL
Nuc Stress EF: 41 %
Peak HR: 76 {beats}/min
Rest HR: 57 {beats}/min
Rest Nuclear Isotope Dose: 11 mCi
SDS: 8
SRS: 0
SSS: 8
ST Depression (mm): 0 mm
Stress Nuclear Isotope Dose: 30.4 mCi
TID: 1.17

## 2021-10-01 MED ORDER — TECHNETIUM TC 99M TETROFOSMIN IV KIT
11.0000 | PACK | Freq: Once | INTRAVENOUS | Status: AC | PRN
  Administered 2021-10-01: 11 via INTRAVENOUS
  Filled 2021-10-01: qty 11

## 2021-10-01 MED ORDER — REGADENOSON 0.4 MG/5ML IV SOLN
0.4000 mg | Freq: Once | INTRAVENOUS | Status: AC
  Administered 2021-10-01: 0.4 mg via INTRAVENOUS

## 2021-10-01 MED ORDER — TECHNETIUM TC 99M TETROFOSMIN IV KIT
30.4000 | PACK | Freq: Once | INTRAVENOUS | Status: AC | PRN
  Administered 2021-10-01: 30.4 via INTRAVENOUS
  Filled 2021-10-01: qty 31

## 2021-10-04 NOTE — Telephone Encounter (Signed)
This patient needs to hold Eliquis for upcoming vascular surgery.  His surgeon wants to hold Eliquis longer than 1 day. ?His CHA2DS2-VASc Score = 8.  He has a hx of CVA. ?Can you help get him set up for Lovenox bridge? ?Thanks, ?Hellen Shanley ? ?[name], PA-C    ?10/04/2021 9:28 PM    ?

## 2021-10-05 NOTE — Telephone Encounter (Signed)
I called and LVM for patient to call back. ?Need to schedule an apt to coordinate a lovenox bridge.  ?

## 2021-10-06 NOTE — Telephone Encounter (Signed)
Called and spoke with patient daughter as pt phone goes right to VM. I requested she ask her father to call us at [phone] between 7-5. ?

## 2021-10-07 NOTE — Telephone Encounter (Signed)
Pt left voicemail at office. Called pt back, phone went directly to voicemail again. Left message. ?

## 2021-10-08 ENCOUNTER — Other Ambulatory Visit: Payer: Self-pay

## 2021-10-08 DIAGNOSIS — I723 Aneurysm of iliac artery: Secondary | ICD-10-CM

## 2021-10-08 NOTE — Telephone Encounter (Signed)
Called and spoke with patient. He states he was off Eliquis for a week a few weeks ago and was fine. I explained that his cardiologist are not comfortable with him being off Eliquis for a long time without something to protect him. He will come in tomorrow to discuss lovenox bridge. ?

## 2021-10-09 ENCOUNTER — Encounter: Payer: Self-pay | Admitting: *Deleted

## 2021-10-09 ENCOUNTER — Ambulatory Visit: Payer: Medicare Other | Admitting: Pharmacist

## 2021-10-09 VITALS — Wt 251.0 lb

## 2021-10-09 DIAGNOSIS — I480 Paroxysmal atrial fibrillation: Secondary | ICD-10-CM | POA: Diagnosis not present

## 2021-10-09 MED ORDER — ENOXAPARIN SODIUM 120 MG/0.8ML IJ SOSY: 120.0000 mg | PREFILLED_SYRINGE | Freq: Two times a day (BID) | INTRAMUSCULAR | 0 refills | Status: DC

## 2021-10-09 NOTE — Progress Notes (Signed)
Periprocedural Bridging per Pharmacy ? ?Patient with diagnosis of atrial fibrillation on Eliquis for anticoagulation.   ? ?Procedure: Abdominal Aortic Endovascular Stent Graft ?Date of procedure: 10/28/2021 ? ?CHA2DS2-VASc Score = 8  ?This indicates a 10.8% annual risk of stroke. ?The patient's score is based upon: ?CHF History: 1 ?HTN History: 1 ?Diabetes History: 1 ?Stroke History: 2 ?Vascular Disease History: 1 ?Age Score: 2 ?Gender Score: 0 ?  ?CrCl 54.86 mL/min (SCr 1.11) ?Platelet count 198K ? ?Patient is a 87 yo male who was referred by [name] for periprocedural bridging with enoxaparin. 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  ? ?Patient presents to PharmD clinic to discuss Lovenox bridging prior to surgery on 4/26. He is reticent to self-inject and says that there is no one at home to assist with injections. He mentions that he had previously been off Eliquis for ten days and was fine. We emphasized that the enoxaparin bridging is highly recommended given his elevated clot risk. Patient agreed he would try the injections.  ? ?Decision made to bridge patient on PTA Eliquis with enoxaparin given CHADsVASc score of 8. Instructed patient on when to start/stop Eliquis -  ?4/23 - Take final Eliquis dose in the evening. ?4/24 - No Eliquis. Inject enoxaparin 120 mg [place] into the abdomen at 0900 and 2100.  ?4/25 - No Eliquis. Inject enoxaparin 120 mg [place] into the abdomen at 0900. No dose at 2100.  ?4/26 - Procedure day ?Restart anticoagulation when deemed safe by provider.  ? ?Lovenox prescription was called into the [facility] at [facility]. Patient was educated on administration technique and was agreeable to try injections. Good Rx coupon given incase he needs it. ? ?Laurey Arrow, PharmD ?PGY1 Pharmacy Resident ?[id number] [place], [place] [address]   ?Phone: [phone]; Fax: [phone]  ?

## 2021-10-09 NOTE — Patient Instructions (Addendum)
4/23 - Take final Eliquis dose in the evening. ? ?4/24 - No Eliquis. Inject enoxaparin 120 mg [place] into the abdomen at 0900 and 2100.  ? ?4/25 - No Eliquis. Inject enoxaparin 120 mg [place] into the abdomen at 0900. No dose at 2100.  ? ?4/26 - Procedure day ? ?Restart anticoagulation when deemed safe by provider.  ?

## 2021-10-19 NOTE — Progress Notes (Signed)
Surgical Instructions ? ? ? Your procedure is scheduled on 10/28/21. ? Report to [facility] Main Entrance "A" at 7:30 A.M., then check in with the Admitting office. ? Call this number if you have problems the morning of surgery: ? [phone] ? ? If you have any questions prior to your surgery date call [phone]: Open Monday-Friday 8am-4pm ? ? ? Remember: ? Do not eat or drink after midnight the night before your surgery ? ? ?  ? Take these medicines the morning of surgery with A SIP OF WATER:  ?metoprolol succinate (TOPROL-XL)  ?rosuvastatin (CRESTOR)  ? ?Follow your surgeon's instructions on when to stop ELIQUIS.  If no instructions were given by your surgeon then you will need to call the office to get those instructions.    ? ?Follow your surgeon's instructions regarding LOVENOX. ? ? ?As of today, STOP taking any Aspirin (unless otherwise instructed by your surgeon) Aleve, Naproxen, Ibuprofen, Motrin, Advil, Goody's, BC's, all herbal medications, fish oil, and all vitamins. ? ?         ?Do not wear jewelry  ?Do not wear lotions, powders, colognes, or deodorant. ?Do not shave 48 hours prior to surgery.  Men may shave face and neck. ?Do not bring valuables to the hospital. ? ? ?[facility] is not responsible for any belongings or valuables. .  ? ?Do NOT Smoke (Tobacco/Vaping)  24 hours prior to your procedure ? ?If you use a CPAP at night, you may bring your mask for your overnight stay. ?  ?Contacts, glasses, hearing aids, dentures or partials may not be worn into surgery, please bring cases for these belongings ?  ?For patients admitted to the hospital, discharge time will be determined by your treatment team. ?  ?Patients discharged the day of surgery will not be allowed to drive home, and someone needs to stay with them for 24 hours. ? ? ?SURGICAL WAITING ROOM VISITATION ?Patients having surgery or a procedure in a hospital may have two support people. ?Children under the age of 8 must have an adult with  them who is not the patient. ?They may stay in the waiting area during the procedure and may switch out with other visitors. If the patient needs to stay at the hospital during part of their recovery, the visitor guidelines for inpatient rooms apply. ? ?Please refer to the [facility] website for the visitor guidelines for Inpatients (after your surgery is over and you are in a regular room).  ? ? ? ?Special instructions:   ? ?Oral Hygiene is also important to reduce your risk of infection.  Remember - BRUSH YOUR TEETH THE MORNING OF SURGERY WITH YOUR REGULAR TOOTHPASTE ? ? ?[name]- Preparing For Surgery ? ?Before surgery, you can play an important role. Because skin is not sterile, your skin needs to be as free of germs as possible. You can reduce the number of germs on your skin by washing with CHG (chlorahexidine gluconate) Soap before surgery.  CHG is an antiseptic cleaner which kills germs and bonds with the skin to continue killing germs even after washing.   ? ? ?Please do not use if you have an allergy to CHG or antibacterial soaps. If your skin becomes reddened/irritated stop using the CHG.  ?Do not shave (including legs and underarms) for at least 48 hours prior to first CHG shower. It is OK to shave your face. ? ?Please follow these instructions carefully. ?  ? ? Shower the [employer] SURGERY and the  MORNING OF SURGERY with CHG Soap.  ? If you chose to wash your hair, wash your hair first as usual with your normal shampoo. After you shampoo, rinse your hair and body thoroughly to remove the shampoo.  Then [employer] and genitals (private parts) with your normal soap and rinse thoroughly to remove soap. ? ?After that Use CHG Soap as you would any other liquid soap. You can apply CHG directly to the skin and wash gently with a scrungie or a clean washcloth.  ? ?Apply the CHG Soap to your body ONLY FROM THE NECK DOWN.  Do not use on open wounds or open sores. Avoid contact with your eyes, ears, mouth  and genitals (private parts). Wash Face and genitals (private parts)  with your normal soap.  ? ?Wash thoroughly, paying special attention to the area where your surgery will be performed. ? ?Thoroughly rinse your body with warm water from the neck down. ? ?DO NOT shower/wash with your normal soap after using and rinsing off the CHG Soap. ? ?Pat yourself dry with a CLEAN TOWEL. ? ?Wear CLEAN PAJAMAS to bed the night before surgery ? ?Place CLEAN SHEETS on your bed the night before your surgery ? ?DO NOT SLEEP WITH PETS. ? ? ?Day of Surgery: ? ?Take a shower with CHG soap. ?Wear Clean/Comfortable clothing the morning of surgery ?Do not apply any deodorants/lotions.   ?Remember to brush your teeth WITH YOUR REGULAR TOOTHPASTE. ? ? ? ?If you received a COVID test during your pre-op visit  it is requested that you wear a mask when out in public, stay away from anyone that may not be feeling well and notify your surgeon if you develop symptoms. If you have been in contact with anyone that has tested positive in the last 10 days please notify you surgeon. ? ?  ?Please read over the following fact sheets that you were given.   ?

## 2021-10-20 ENCOUNTER — Encounter (HOSPITAL_COMMUNITY)
Admission: RE | Admit: 2021-10-20 | Discharge: 2021-10-20 | Disposition: A | Discharge status: Home / Self Care | Payer: Medicare Other | Source: Ambulatory Visit | Attending: Vascular Surgery | Admitting: Vascular Surgery

## 2021-10-20 ENCOUNTER — Other Ambulatory Visit: Payer: Self-pay

## 2021-10-20 ENCOUNTER — Encounter (HOSPITAL_COMMUNITY): Payer: Self-pay

## 2021-10-20 VITALS — BP 187/103 | HR 85 | Temp 97.6°F | Resp 18 | Ht 71.0 in | Wt 247.3 lb

## 2021-10-20 DIAGNOSIS — Z01812 Encounter for preprocedural laboratory examination: Secondary | ICD-10-CM | POA: Diagnosis present

## 2021-10-20 DIAGNOSIS — I4510 Unspecified right bundle-branch block: Secondary | ICD-10-CM | POA: Diagnosis not present

## 2021-10-20 DIAGNOSIS — I429 Cardiomyopathy, unspecified: Secondary | ICD-10-CM | POA: Diagnosis not present

## 2021-10-20 DIAGNOSIS — K219 Gastro-esophageal reflux disease without esophagitis: Secondary | ICD-10-CM | POA: Insufficient documentation

## 2021-10-20 DIAGNOSIS — I130 Hypertensive heart and chronic kidney disease with heart failure and stage 1 through stage 4 chronic kidney disease, or unspecified chronic kidney disease: Secondary | ICD-10-CM | POA: Insufficient documentation

## 2021-10-20 DIAGNOSIS — Z8546 Personal history of malignant neoplasm of prostate: Secondary | ICD-10-CM | POA: Diagnosis not present

## 2021-10-20 DIAGNOSIS — I509 Heart failure, unspecified: Secondary | ICD-10-CM | POA: Insufficient documentation

## 2021-10-20 DIAGNOSIS — I723 Aneurysm of iliac artery: Secondary | ICD-10-CM | POA: Diagnosis present

## 2021-10-20 DIAGNOSIS — I2510 Atherosclerotic heart disease of native coronary artery without angina pectoris: Secondary | ICD-10-CM | POA: Diagnosis not present

## 2021-10-20 DIAGNOSIS — N189 Chronic kidney disease, unspecified: Secondary | ICD-10-CM | POA: Insufficient documentation

## 2021-10-20 DIAGNOSIS — Z8673 Personal history of transient ischemic attack (TIA), and cerebral infarction without residual deficits: Secondary | ICD-10-CM | POA: Insufficient documentation

## 2021-10-20 DIAGNOSIS — G4733 Obstructive sleep apnea (adult) (pediatric): Secondary | ICD-10-CM | POA: Insufficient documentation

## 2021-10-20 DIAGNOSIS — I080 Rheumatic disorders of both mitral and aortic valves: Secondary | ICD-10-CM | POA: Diagnosis not present

## 2021-10-20 DIAGNOSIS — Z01818 Encounter for other preprocedural examination: Secondary | ICD-10-CM

## 2021-10-20 DIAGNOSIS — E785 Hyperlipidemia, unspecified: Secondary | ICD-10-CM | POA: Diagnosis not present

## 2021-10-20 HISTORY — DX: Atherosclerotic heart disease of native coronary artery without angina pectoris: I25.10

## 2021-10-20 LAB — SURGICAL PCR SCREEN
MRSA, PCR: NEGATIVE
Staphylococcus aureus: NEGATIVE

## 2021-10-20 LAB — URINALYSIS, ROUTINE W REFLEX MICROSCOPIC
Bacteria, UA: NONE SEEN
Bilirubin Urine: NEGATIVE
Glucose, UA: NEGATIVE mg/dL
Hgb urine dipstick: NEGATIVE
Ketones, ur: NEGATIVE mg/dL
Leukocytes,Ua: NEGATIVE
Nitrite: NEGATIVE
Protein, ur: 30 mg/dL — AB
Specific Gravity, Urine: 1.014 (ref 1.005–1.030)
pH: 5 (ref 5.0–8.0)

## 2021-10-20 LAB — COMPREHENSIVE METABOLIC PANEL
ALT: 21 U/L (ref 0–44)
AST: 23 U/L (ref 15–41)
Albumin: 4.1 g/dL (ref 3.5–5.0)
Alkaline Phosphatase: 68 U/L (ref 38–126)
Anion gap: 9 (ref 5–15)
BUN: 17 mg/dL (ref 8–23)
CO2: 26 mmol/L (ref 22–32)
Calcium: 9.3 mg/dL (ref 8.9–10.3)
Chloride: 106 mmol/L (ref 98–111)
Creatinine, Ser: 1.03 mg/dL (ref 0.61–1.24)
GFR, Estimated: >60 mL/min (ref >60)
Glucose, Bld: 174 mg/dL — ABNORMAL HIGH (ref 70–99)
Potassium: 4.1 mmol/L (ref 3.5–5.1)
Sodium: 141 mmol/L (ref 135–145)
Total Bilirubin: 0.9 mg/dL (ref 0.3–1.2)
Total Protein: 7.4 g/dL (ref 6.5–8.1)

## 2021-10-20 LAB — PROTIME-INR
INR: 1.2 (ref 0.8–1.2)
Prothrombin Time: 14.8 seconds (ref 11.4–15.2)

## 2021-10-20 LAB — APTT: aPTT: 37 seconds — ABNORMAL HIGH (ref 24–36)

## 2021-10-20 LAB — CBC
HCT: 46.8 % (ref 39.0–52.0)
Hemoglobin: 15.1 g/dL (ref 13.0–17.0)
MCH: 29.6 pg (ref 26.0–34.0)
MCHC: 32.3 g/dL (ref 30.0–36.0)
MCV: 91.8 fL (ref 80.0–100.0)
Platelets: 193 10*3/uL (ref 150–400)
RBC: 5.1 MIL/uL (ref 4.22–5.81)
RDW: 12.9 % (ref 11.5–15.5)
WBC: 6.2 10*3/uL (ref 4.0–10.5)
nRBC: 0 % (ref 0.0–0.2)

## 2021-10-20 NOTE — Progress Notes (Signed)
PCP:  [name], MD ?Cardiologist:  Sherren Mocha, MD ? ?EKG: 09/16/21 ?CXR:  04/22/19 ?ECHO:  09/30/21 ?Stress Test:  10/01/21 ?Cardiac Cath:  04/25/19 ? ?Fasting Blood Sugar-  na ?Checks Blood Sugar__na_ times a day ? ?ASA: No ?Blood Thinner: Last dose Eliquis 10/24/21 per patient ?Lovenox:  Per patient, first dose of Eliquis will be on 10/25/21 ? ?OSA/CPAP: No ? ?Covid test not needed ? ?Anesthesia Review: Yes, cardiac history.  Patient's surgery was recently cancelled due to elevated blood pressure.  BP was 187/103 at PAT. He states he has not talked to anyone about this.   ? ?Patient denies shortness of breath, fever, cough, and chest pain at PAT appointment. ? ?Patient verbalized understanding of instructions provided today at the PAT appointment.  Patient asked to review instructions at home and day of surgery.   ?

## 2021-10-21 ENCOUNTER — Encounter (HOSPITAL_COMMUNITY): Payer: Self-pay

## 2021-10-21 NOTE — Progress Notes (Addendum)
Anesthesia Chart Review: ? Case: [id number] Date/Time: 10/28/21 0915  ? Procedure: ABDOMINAL AORTIC ENDOVASCULAR STENT GRAFT (Right)  ? Anesthesia type: General  ? Pre-op diagnosis: RIGHT ILIAC ANEURYSM  ? Location: [facility] ROOM 16 / [facility] OR  ? Surgeons: Cherre Robins, MD  ? ?  ? ? ?DISCUSSION: Patient is an 87 year old male scheduled for the above procedure. ? 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 ? ?Patient had cardiology preoperative evaluation by [name], PA-C on 09/16/2021.  Stress test and echocardiogram were ordered. See below--stress test intermediate risk due to low EF, but EF normal on echo. Overall stress test felt to be "low risk" per review by Dr. Burt Knack. Per Mr. Kathlen Mody, "Ok to proceed with surgery at acceptable risk".  Entresto 97/103 mg twice daily (had been out) continued and asked to resume Toprol XL 50 mg daily. ? ?Per [facility] Pharmacist, last Eliquis 10/25/21, with Lovenox bridge starting 10/26/21. Confirmed this with Mr. Loewen.  ? ?BP elevated at PAT 187/103. I don't see that BP was rechecked. I called and spoke with Mr. Gottlieb on 10/22/21. He says that he had taken his BP medications late that morning and not much before his PAT appointment. Also he reported that typically BP higher on arrival to medical appointments but often better when rechecked after several minutes. He confirmed BP was not rechecked at PAT. He also confirmed he is taking Toprol, Entresto, and Lasix as prescribed.  He has not required any extra doses of Lasix.  He denies any new CV symptoms since  his cardiology evaluation.  He has a home blood pressure monitor, but says it is "depressing" and it does not work well, so he does not check home blood pressures. I will sent a message to Dr. Mora Appl and [facility] nursing staff. He will get vitals on arrival for surgery.  ? ?Will need repeat type and screen on arrival for surgery given antibodies on PAT sample. ? ? ?VS: BP (!) 187/103   Pulse 85   Temp 36.4 ?C (Oral)   Resp 18   Ht '5\' 11"'$  (1.803 m)   Wt 112.2 kg   SpO2 98%   BMI 34.49 kg/m?  ?BP Readings from Last 3 Encounters:  ?10/20/21 (!) 187/103  ?09/16/21 132/78  ?09/07/21 (!) 160/100  ?  ? ?PROVIDERS: ?[name], MD is PCP  ?Sherren Mocha, MD is cardiologist ? ? ?LABS: Labs reviewed: Acceptable for surgery. A1c 7.2% 05/04/21 (Dr. [name], Care Everywhere) ?(all labs ordered are listed, but only abnormal results are displayed) ? ?Labs Reviewed  ?APTT - Abnormal; Notable for the following components:  ?    Result Value  ? aPTT 37 (*)   ? All other components within normal limits  ?COMPREHENSIVE METABOLIC PANEL - Abnormal; Notable for the following components:  ? Glucose, Bld 174 (*)   ? All other components within normal limits  ?URINALYSIS, ROUTINE W REFLEX MICROSCOPIC - Abnormal; Notable for the following components:  ? Protein, ur 30 (*)   ? All other components within normal limits  ?SURGICAL PCR SCREEN  ?CBC  ?PROTIME-INR  ?TYPE AND SCREEN  ? ? ? ?IMAGES: ?CTA Abd/pelvis 08/17/21: ?IMPRESSION: ?VASCULAR ?  1. Aneurysm of the distal right common iliac artery measuring 4.1 ?cm. Aneurysm has enlarged since 2016 as described. ?2. Aorta at the hiatus measures 3.4 cm. ?3. Diffuse atherosclerotic calcifications in the aorta and visceral ?arteries. No significant visceral artery stenosis. ?  ?NON-VASCULAR ?1. No acute abnormality in the abdomen or pelvis. ?2. Chronic abnormal appearance of L2. L2 abnormality has been ?previously described as chronic metastatic disease but Paget's ?disease would also be in the  differential diagnosis. ?3. Small low-density structures in left kidney that are too small to ?definitively characterize but probably represent hyperdense cysts ?based on the stability. Recommend attention on follow-up imaging. ?4. Chronic mesenteric calcifications without surrounding abnormal ?soft tissue. No lymph node enlargement in the abdomen or pelvis. ?5. Prostate enlargement. ?  ?PET Scan 07/07/21: ?IMPRESSION: ?- No convincing evidence of new disease by PSMA PET in the neck, ?chest, abdomen or pelvis. ?- Heterogeneous low level uptake in the prostate, no focal areas of ?marked increased radiotracer accumulation. ?- Area showing chronic coarsening of trabeculation and sclerosis ?involving the L2 vertebral body both anterior and posterior elements ?has showed increased activity on bone scan and also with mildly ?increased radiotracer accumulation on PSMA PET. Not to the extent ?that would make this diagnostic of metastatic disease, if so ?indolent or quiescent disease would be favored, would also consider ?the possibility of alternative diagnosis of Paget's disease. ?- Chronic canal narrowing at the L2 level due to the above process. ?- Nearly 4 cm RIGHT common iliac artery aneurysm that was present ?previously having enlarged approximately 4 mm since 2016. Suggest ?interventional radiology consultation given size greater than 3.5 ?cm. ?  ? ?EKG: 09/16/21 ([facility]): NSR. LAD. RBBB. ? ? ?CV: ?Nuclear stress test 10/01/21: ?  Findings are consistent with prior myocardial infarction and no peri-infarct ischemia. The study is intermediate risk due to depresesd ejection fraction. ?  No ST deviation was noted. ?  Left ventricular function is abnormal. Global function is moderately reduced. Nuclear stress EF: 41 %. The left ventricular ejection fraction is moderately decreased (30-44%). End diastolic cavity size is moderately enlarged. ?  Prior study not available for comparison. ? ? ?Echo 09/30/21: ?IMPRESSIONS   ? 1. Left ventricular ejection fraction, by estimation, is 60 to 65%. The  ?left ventricle has normal function. The left ventricle has no regional  ?wall motion abnormalities. There is mild concentric left ventricular  ?hypertrophy. Left ventricular diastolic  ?parameters are consistent with Grade I diastolic dysfunction (impaired  ?relaxation).  ? 2. Right ventricular systolic function is normal. The right ventricular  ?size is normal. There is mildly elevated pulmonary artery systolic  ?pressure.  ? 3. Left atrial size was moderately dilated.  ? 4. The mitral valve is myxomatous. Eccentric anteriorly directed moderate  ?mitral valve regurgitation. No evidence of mitral stenosis.  ? 5. The aortic valve is tricuspid. There is mild thickening of the aortic  ?valve. Aortic valve regurgitation is mild to moderate. No aortic stenosis  ?is present.  ? 6. There is dilatation of the ascending aorta, measuring 41 mm. There is  ?dilatation of the aortic root, measuring 40 mm.  ? 7. The inferior vena cava is normal in size with greater than 50%  ?respiratory variability, suggesting right atrial pressure of 3 mmHg.  ?- Comparison(s): A prior study was performed on 11/04/2020. No significant  ?change from prior study.  ? ? ?Long term event monitor 06/25/20-06/28/20: ?Study reviewed: sinus rhythm with average HR 68 bpm, occasional PVC's, rare supraventricular beats. There  are rare supraventricular runs, but HR only mildly increased at 105 bpm during the longest run. ? ? ?Cardiac cath 04/25/19: ?Hemodynamic findings consistent with mild pulmonary hypertension - mean PAP 28 mmHg ?LV end diastolic pressure is mildly elevated. - LVEDP 12-15 mmHg ?Cardiac output-index 7.44-3.27. ?--------------------------------- ?Ost LM to Mid LM lesion is 20% stenosed. Ost Cx lesion is 30% stenosed. -- calcified ?1st Mrg lesion is 60% stenosed. ?Dist LAD lesion is 55% stenosed with 55% stenosed side branch in 2nd Diag. - the downstream LAD is  relatively the same diameter as noted in the stenosis. ?Otherwise minimal to mild disease throughout. ?  ?SUMMARY ?Diffuse mild to moderate CAD: Most notable 60% proximal OM1, 50 to 60% mid LAD at D2.  No obvious cu

## 2021-10-21 NOTE — Progress Notes (Signed)
Received call from blood bank that patient is positive for antibodies and we will need to repeat the type and screen the day of surgery.  New order placed. ?

## 2021-10-22 NOTE — Anesthesia Preprocedure Evaluation (Addendum)
Anesthesia Evaluation  ?Patient identified by MRN, date of birth, ID band ?Patient awake ? ? ? ?Reviewed: ?Allergy & Precautions, NPO status , Patient's Chart, lab work & pertinent test results ? ?Airway ?Mallampati: II ? ?TM Distance: >3 FB ?Neck ROM: Full ? ? ? Dental ? ?(+) Dental Advisory Given ?  ?Pulmonary ?sleep apnea , former smoker,  ?  ?breath sounds clear to auscultation ? ? ? ? ? ? Cardiovascular ?hypertension, Pt. on medications and Pt. on home beta blockers ?+ CAD, + Past MI and +CHF  ?+ dysrhythmias + Valvular Problems/Murmurs MR  ?Rhythm:Regular Rate:Normal ? ? ?  ?Neuro/Psych ? Headaches, CVA   ? GI/Hepatic ?Neg liver ROS, GERD  ,  ?Endo/Other  ?diabetes, Type 2 ? Renal/GU ?Renal InsufficiencyRenal disease  ? ?  ?Musculoskeletal ? ? Abdominal ?  ?Peds ? Hematology ?  ?Anesthesia Other Findings ? ? Reproductive/Obstetrics ? ?  ? ? ? ? ? ? ? ? ? ? ? ? ? ?  ?  ? ? ? ? ? ? ? ?Lab Results  ?Component Value Date  ? WBC 6.2 10/20/2021  ? HGB 15.1 10/20/2021  ? HCT 46.8 10/20/2021  ? MCV 91.8 10/20/2021  ? PLT 193 10/20/2021  ? ?Lab Results  ?Component Value Date  ? CREATININE 1.03 10/20/2021  ? BUN 17 10/20/2021  ? NA 141 10/20/2021  ? K 4.1 10/20/2021  ? CL 106 10/20/2021  ? CO2 26 10/20/2021  ? ? ?Anesthesia Physical ?Anesthesia Plan ? ?ASA: 3 ? ?Anesthesia Plan: General  ? ?Post-op Pain Management: Tylenol PO (pre-op)* and Minimal or no pain anticipated  ? ?Induction: Intravenous ? ?PONV Risk Score and Plan: 2 and Dexamethasone, Ondansetron and Treatment may vary due to age or medical condition ? ?Airway Management Planned: Oral ETT ? ?Additional Equipment: Arterial line ? ?Intra-op Plan:  ? ?Post-operative Plan: Extubation in OR ? ?Informed Consent: I have reviewed the patients History and Physical, chart, labs and discussed the procedure including the risks, benefits and alternatives for the proposed anesthesia with the patient or authorized representative who has  indicated his/her understanding and acceptance.  ? ? ? ?Dental advisory given ? ?Plan Discussed with: CRNA ? ?Anesthesia Plan Comments:   ? ? ? ? ? ?Anesthesia Quick Evaluation ? ?

## 2021-10-28 ENCOUNTER — Other Ambulatory Visit: Payer: Self-pay

## 2021-10-28 ENCOUNTER — Inpatient Hospital Stay (HOSPITAL_COMMUNITY)
Admission: RE | Admit: 2021-10-28 | Discharge: 2021-10-29 | DRG: 269 | Disposition: A | Discharge status: Home / Self Care | Payer: Medicare Other | Attending: Vascular Surgery | Admitting: Vascular Surgery

## 2021-10-28 ENCOUNTER — Inpatient Hospital Stay (HOSPITAL_COMMUNITY): Payer: Medicare Other

## 2021-10-28 ENCOUNTER — Inpatient Hospital Stay (HOSPITAL_COMMUNITY): Payer: Medicare Other | Admitting: Vascular Surgery

## 2021-10-28 ENCOUNTER — Inpatient Hospital Stay (HOSPITAL_COMMUNITY): Payer: Medicare Other | Admitting: Anesthesiology

## 2021-10-28 ENCOUNTER — Encounter (HOSPITAL_COMMUNITY)
Admission: RE | Disposition: A | Discharge status: Home / Self Care | Payer: Self-pay | Source: Home / Self Care | Attending: Vascular Surgery

## 2021-10-28 ENCOUNTER — Encounter (HOSPITAL_COMMUNITY): Payer: Self-pay | Admitting: Vascular Surgery

## 2021-10-28 DIAGNOSIS — I2510 Atherosclerotic heart disease of native coronary artery without angina pectoris: Secondary | ICD-10-CM | POA: Diagnosis present

## 2021-10-28 DIAGNOSIS — Y832 Surgical operation with anastomosis, bypass or graft as the cause of abnormal reaction of the patient, or of later complication, without mention of misadventure at the time of the procedure: Secondary | ICD-10-CM | POA: Diagnosis not present

## 2021-10-28 DIAGNOSIS — Z7982 Long term (current) use of aspirin: Secondary | ICD-10-CM | POA: Diagnosis not present

## 2021-10-28 DIAGNOSIS — N1831 Chronic kidney disease, stage 3a: Secondary | ICD-10-CM | POA: Diagnosis present

## 2021-10-28 DIAGNOSIS — I9789 Other postprocedural complications and disorders of the circulatory system, not elsewhere classified: Secondary | ICD-10-CM | POA: Diagnosis not present

## 2021-10-28 DIAGNOSIS — G4730 Sleep apnea, unspecified: Secondary | ICD-10-CM | POA: Diagnosis present

## 2021-10-28 DIAGNOSIS — I130 Hypertensive heart and chronic kidney disease with heart failure and stage 1 through stage 4 chronic kidney disease, or unspecified chronic kidney disease: Secondary | ICD-10-CM | POA: Diagnosis present

## 2021-10-28 DIAGNOSIS — Z8546 Personal history of malignant neoplasm of prostate: Secondary | ICD-10-CM

## 2021-10-28 DIAGNOSIS — I723 Aneurysm of iliac artery: Secondary | ICD-10-CM | POA: Diagnosis present

## 2021-10-28 DIAGNOSIS — K219 Gastro-esophageal reflux disease without esophagitis: Secondary | ICD-10-CM | POA: Diagnosis present

## 2021-10-28 DIAGNOSIS — E1122 Type 2 diabetes mellitus with diabetic chronic kidney disease: Secondary | ICD-10-CM | POA: Diagnosis present

## 2021-10-28 DIAGNOSIS — Z8673 Personal history of transient ischemic attack (TIA), and cerebral infarction without residual deficits: Secondary | ICD-10-CM

## 2021-10-28 DIAGNOSIS — E785 Hyperlipidemia, unspecified: Secondary | ICD-10-CM | POA: Diagnosis present

## 2021-10-28 DIAGNOSIS — I110 Hypertensive heart disease with heart failure: Secondary | ICD-10-CM

## 2021-10-28 DIAGNOSIS — I5022 Chronic systolic (congestive) heart failure: Secondary | ICD-10-CM | POA: Diagnosis present

## 2021-10-28 DIAGNOSIS — I714 Abdominal aortic aneurysm, without rupture, unspecified: Secondary | ICD-10-CM | POA: Diagnosis present

## 2021-10-28 DIAGNOSIS — Z886 Allergy status to analgesic agent status: Secondary | ICD-10-CM

## 2021-10-28 DIAGNOSIS — Z87891 Personal history of nicotine dependence: Secondary | ICD-10-CM

## 2021-10-28 DIAGNOSIS — I4891 Unspecified atrial fibrillation: Secondary | ICD-10-CM | POA: Diagnosis present

## 2021-10-28 DIAGNOSIS — I351 Nonrheumatic aortic (valve) insufficiency: Secondary | ICD-10-CM | POA: Diagnosis present

## 2021-10-28 DIAGNOSIS — Z79899 Other long term (current) drug therapy: Secondary | ICD-10-CM | POA: Diagnosis not present

## 2021-10-28 DIAGNOSIS — I252 Old myocardial infarction: Secondary | ICD-10-CM

## 2021-10-28 HISTORY — PX: ABDOMINAL AORTIC ENDOVASCULAR STENT GRAFT: SHX5707

## 2021-10-28 LAB — POCT ACTIVATED CLOTTING TIME: Activated Clotting Time: 269 seconds

## 2021-10-28 LAB — TYPE AND SCREEN
ABO/RH(D): A NEG
Antibody Screen: POSITIVE
Donor AG Type: NEGATIVE
Donor AG Type: NEGATIVE
Unit division: 0
Unit division: 0
Unit division: 0
Unit division: 0

## 2021-10-28 LAB — PROTIME-INR
INR: 1.1 (ref 0.8–1.2)
Prothrombin Time: 14.3 seconds (ref 11.4–15.2)

## 2021-10-28 LAB — BPAM RBC
Blood Product Expiration Date: 202305172359
Blood Product Expiration Date: 202305172359
Blood Product Expiration Date: 202305192359
Blood Product Expiration Date: 202305192359
Unit Type and Rh: 600
Unit Type and Rh: 600
Unit Type and Rh: 600
Unit Type and Rh: 600

## 2021-10-28 LAB — APTT: aPTT: 43 seconds — ABNORMAL HIGH (ref 24–36)

## 2021-10-28 LAB — CBC
HCT: 39.5 % (ref 39.0–52.0)
Hemoglobin: 13.1 g/dL (ref 13.0–17.0)
MCH: 30.3 pg (ref 26.0–34.0)
MCHC: 33.2 g/dL (ref 30.0–36.0)
MCV: 91.4 fL (ref 80.0–100.0)
Platelets: 149 10*3/uL — ABNORMAL LOW (ref 150–400)
RBC: 4.32 MIL/uL (ref 4.22–5.81)
RDW: 13.2 % (ref 11.5–15.5)
WBC: 6.8 10*3/uL (ref 4.0–10.5)
nRBC: 0 % (ref 0.0–0.2)

## 2021-10-28 LAB — BASIC METABOLIC PANEL
Anion gap: 9 (ref 5–15)
BUN: 16 mg/dL (ref 8–23)
CO2: 23 mmol/L (ref 22–32)
Calcium: 7.3 mg/dL — ABNORMAL LOW (ref 8.9–10.3)
Chloride: 107 mmol/L (ref 98–111)
Creatinine, Ser: 0.98 mg/dL (ref 0.61–1.24)
GFR, Estimated: >60 mL/min (ref >60)
Glucose, Bld: 191 mg/dL — ABNORMAL HIGH (ref 70–99)
Potassium: 4.2 mmol/L (ref 3.5–5.1)
Sodium: 139 mmol/L (ref 135–145)

## 2021-10-28 LAB — MAGNESIUM: Magnesium: 1.6 mg/dL — ABNORMAL LOW (ref 1.7–2.4)

## 2021-10-28 SURGERY — INSERTION, ENDOVASCULAR STENT GRAFT, AORTA, ABDOMINAL
Anesthesia: General | Site: Abdomen | Laterality: Right

## 2021-10-28 MED ORDER — LABETALOL HCL 5 MG/ML IV SOLN
10.0000 mg | INTRAVENOUS | Status: DC | PRN
  Administered 2021-10-28: 10 mg via INTRAVENOUS

## 2021-10-28 MED ORDER — PANTOPRAZOLE SODIUM 40 MG PO TBEC
40.0000 mg | DELAYED_RELEASE_TABLET | Freq: Every day | ORAL | Status: DC
  Administered 2021-10-28 – 2021-10-29 (×2): 40 mg via ORAL
  Filled 2021-10-28 (×2): qty 1

## 2021-10-28 MED ORDER — ACETAMINOPHEN 325 MG PO TABS: 325.0000 mg | ORAL_TABLET | ORAL | Status: DC | PRN

## 2021-10-28 MED ORDER — BISACODYL 5 MG PO TBEC: 5.0000 mg | DELAYED_RELEASE_TABLET | Freq: Every day | ORAL | Status: DC | PRN

## 2021-10-28 MED ORDER — HEPARIN SODIUM (PORCINE) 1000 UNIT/ML IJ SOLN
INTRAMUSCULAR | Status: AC
  Filled 2021-10-28: qty 10

## 2021-10-28 MED ORDER — SODIUM CHLORIDE 0.9 % IV SOLN: INTRAVENOUS | Status: DC

## 2021-10-28 MED ORDER — SODIUM CHLORIDE 0.9% FLUSH
3.0000 mL | Freq: Two times a day (BID) | INTRAVENOUS | Status: DC
  Administered 2021-10-28 – 2021-10-29 (×3): 3 mL via INTRAVENOUS

## 2021-10-28 MED ORDER — CHLORHEXIDINE GLUCONATE CLOTH 2 % EX PADS: 6.0000 | MEDICATED_PAD | Freq: Once | CUTANEOUS | Status: DC

## 2021-10-28 MED ORDER — GUAIFENESIN-DM 100-10 MG/5ML PO SYRP: 15.0000 mL | ORAL_SOLUTION | ORAL | Status: DC | PRN

## 2021-10-28 MED ORDER — SENNOSIDES-DOCUSATE SODIUM 8.6-50 MG PO TABS: 1.0000 | ORAL_TABLET | Freq: Every evening | ORAL | Status: DC | PRN

## 2021-10-28 MED ORDER — LACTATED RINGERS IV SOLN: INTRAVENOUS | Status: DC

## 2021-10-28 MED ORDER — ESMOLOL HCL 100 MG/10ML IV SOLN
INTRAVENOUS | Status: AC
  Filled 2021-10-28: qty 10

## 2021-10-28 MED ORDER — SACUBITRIL-VALSARTAN 97-103 MG PO TABS
1.0000 | ORAL_TABLET | Freq: Two times a day (BID) | ORAL | Status: DC
  Administered 2021-10-28: 1 via ORAL
  Filled 2021-10-28 (×2): qty 1

## 2021-10-28 MED ORDER — PROTAMINE SULFATE 10 MG/ML IV SOLN
INTRAVENOUS | Status: DC | PRN
  Administered 2021-10-28: 50 mg via INTRAVENOUS

## 2021-10-28 MED ORDER — CEFAZOLIN SODIUM-DEXTROSE 2-4 GM/100ML-% IV SOLN
2.0000 g | Freq: Three times a day (TID) | INTRAVENOUS | Status: AC
  Administered 2021-10-28 (×2): 2 g via INTRAVENOUS
  Filled 2021-10-28 (×2): qty 100

## 2021-10-28 MED ORDER — ONDANSETRON HCL 4 MG/2ML IJ SOLN: 4.0000 mg | Freq: Four times a day (QID) | INTRAMUSCULAR | Status: DC | PRN

## 2021-10-28 MED ORDER — ORAL CARE MOUTH RINSE: 15.0000 mL | Freq: Once | OROMUCOSAL | Status: AC

## 2021-10-28 MED ORDER — SACUBITRIL-VALSARTAN 97-103 MG PO TABS
1.0000 | ORAL_TABLET | Freq: Two times a day (BID) | ORAL | Status: DC
  Administered 2021-10-29: 1 via ORAL
  Filled 2021-10-28 (×2): qty 1

## 2021-10-28 MED ORDER — LACTATED RINGERS IV SOLN: INTRAVENOUS | Status: DC | PRN

## 2021-10-28 MED ORDER — DEXAMETHASONE SODIUM PHOSPHATE 10 MG/ML IJ SOLN
INTRAMUSCULAR | Status: DC | PRN
  Administered 2021-10-28: 4 mg via INTRAVENOUS

## 2021-10-28 MED ORDER — PROPOFOL 10 MG/ML IV BOLUS
INTRAVENOUS | Status: AC
  Filled 2021-10-28: qty 20

## 2021-10-28 MED ORDER — ESMOLOL HCL 100 MG/10ML IV SOLN
INTRAVENOUS | Status: DC | PRN
Start: 2021-10-28 — End: 2021-10-28
  Administered 2021-10-28: 30 mg via INTRAVENOUS

## 2021-10-28 MED ORDER — METOPROLOL TARTRATE 5 MG/5ML IV SOLN: 2.0000 mg | INTRAVENOUS | Status: DC | PRN

## 2021-10-28 MED ORDER — ACETAMINOPHEN 500 MG PO TABS
1000.0000 mg | ORAL_TABLET | Freq: Once | ORAL | Status: AC
  Administered 2021-10-28: 1000 mg via ORAL
  Filled 2021-10-28: qty 2

## 2021-10-28 MED ORDER — HYDRALAZINE HCL 20 MG/ML IJ SOLN
INTRAMUSCULAR | Status: AC
  Filled 2021-10-28: qty 1

## 2021-10-28 MED ORDER — HYDROMORPHONE HCL 1 MG/ML IJ SOLN: 0.5000 mg | INTRAMUSCULAR | Status: DC | PRN

## 2021-10-28 MED ORDER — ROCURONIUM BROMIDE 10 MG/ML (PF) SYRINGE
PREFILLED_SYRINGE | INTRAVENOUS | Status: AC
  Filled 2021-10-28: qty 10

## 2021-10-28 MED ORDER — ROCURONIUM BROMIDE 10 MG/ML (PF) SYRINGE
PREFILLED_SYRINGE | INTRAVENOUS | Status: DC | PRN
  Administered 2021-10-28: 20 mg via INTRAVENOUS
  Administered 2021-10-28: 60 mg via INTRAVENOUS
  Administered 2021-10-28: 20 mg via INTRAVENOUS

## 2021-10-28 MED ORDER — PHENYLEPHRINE HCL-NACL 20-0.9 MG/250ML-% IV SOLN
INTRAVENOUS | Status: DC | PRN
  Administered 2021-10-28: 20 ug/min via INTRAVENOUS

## 2021-10-28 MED ORDER — TRAMADOL HCL 50 MG PO TABS: 50.0000 mg | ORAL_TABLET | Freq: Four times a day (QID) | ORAL | Status: DC | PRN

## 2021-10-28 MED ORDER — FENTANYL CITRATE (PF) 250 MCG/5ML IJ SOLN
INTRAMUSCULAR | Status: AC
  Filled 2021-10-28: qty 5

## 2021-10-28 MED ORDER — LIDOCAINE 2% (20 MG/ML) 5 ML SYRINGE
INTRAMUSCULAR | Status: DC | PRN
  Administered 2021-10-28: 60 mg via INTRAVENOUS

## 2021-10-28 MED ORDER — LIDOCAINE 2% (20 MG/ML) 5 ML SYRINGE
INTRAMUSCULAR | Status: AC
  Filled 2021-10-28: qty 5

## 2021-10-28 MED ORDER — SODIUM CHLORIDE 0.9 % IV SOLN: 500.0000 mL | Freq: Once | INTRAVENOUS | Status: DC | PRN

## 2021-10-28 MED ORDER — IODIXANOL 320 MG/ML IV SOLN
INTRAVENOUS | Status: DC | PRN
  Administered 2021-10-28: 95 mL via INTRA_ARTERIAL

## 2021-10-28 MED ORDER — ROSUVASTATIN CALCIUM 20 MG PO TABS
20.0000 mg | ORAL_TABLET | Freq: Every day | ORAL | Status: DC
  Administered 2021-10-28 – 2021-10-29 (×2): 20 mg via ORAL
  Filled 2021-10-28 (×2): qty 1

## 2021-10-28 MED ORDER — CHLORHEXIDINE GLUCONATE 0.12 % MT SOLN
15.0000 mL | Freq: Once | OROMUCOSAL | Status: AC
  Administered 2021-10-28: 15 mL via OROMUCOSAL
  Filled 2021-10-28: qty 15

## 2021-10-28 MED ORDER — FENTANYL CITRATE (PF) 250 MCG/5ML IJ SOLN
INTRAMUSCULAR | Status: DC | PRN
  Administered 2021-10-28 (×2): 100 ug via INTRAVENOUS

## 2021-10-28 MED ORDER — LABETALOL HCL 5 MG/ML IV SOLN
10.0000 mg | INTRAVENOUS | Status: DC | PRN
  Filled 2021-10-28: qty 4

## 2021-10-28 MED ORDER — PROTAMINE SULFATE 10 MG/ML IV SOLN
INTRAVENOUS | Status: AC
  Filled 2021-10-28: qty 5

## 2021-10-28 MED ORDER — METOPROLOL SUCCINATE ER 25 MG PO TB24
50.0000 mg | ORAL_TABLET | Freq: Every day | ORAL | Status: DC
  Administered 2021-10-28 – 2021-10-29 (×2): 50 mg via ORAL
  Filled 2021-10-28 (×2): qty 2

## 2021-10-28 MED ORDER — ENOXAPARIN SODIUM 120 MG/0.8ML IJ SOSY
120.0000 mg | PREFILLED_SYRINGE | Freq: Two times a day (BID) | INTRAMUSCULAR | Status: DC
  Filled 2021-10-28: qty 0.8

## 2021-10-28 MED ORDER — HEPARIN 6000 UNIT IRRIGATION SOLUTION
Status: AC
  Filled 2021-10-28: qty 500

## 2021-10-28 MED ORDER — LABETALOL HCL 5 MG/ML IV SOLN
INTRAVENOUS | Status: DC | PRN
  Administered 2021-10-28: 5 mg via INTRAVENOUS
  Administered 2021-10-28: 10 mg via INTRAVENOUS

## 2021-10-28 MED ORDER — HYDRALAZINE HCL 20 MG/ML IJ SOLN
10.0000 mg | Freq: Once | INTRAMUSCULAR | Status: AC
  Administered 2021-10-28: 10 mg via INTRAVENOUS

## 2021-10-28 MED ORDER — METOPROLOL SUCCINATE ER 50 MG PO TB24: 50.0000 mg | ORAL_TABLET | Freq: Every day | ORAL | Status: DC

## 2021-10-28 MED ORDER — ASPIRIN EC 81 MG PO TBEC
81.0000 mg | DELAYED_RELEASE_TABLET | Freq: Every day | ORAL | Status: DC
  Filled 2021-10-28 (×2): qty 1

## 2021-10-28 MED ORDER — ACETAMINOPHEN 650 MG RE SUPP: 325.0000 mg | RECTAL | Status: DC | PRN

## 2021-10-28 MED ORDER — LABETALOL HCL 5 MG/ML IV SOLN
INTRAVENOUS | Status: AC
  Filled 2021-10-28: qty 4

## 2021-10-28 MED ORDER — AMISULPRIDE (ANTIEMETIC) 5 MG/2ML IV SOLN: 10.0000 mg | Freq: Once | INTRAVENOUS | Status: DC | PRN

## 2021-10-28 MED ORDER — EPHEDRINE SULFATE-NACL 50-0.9 MG/10ML-% IV SOSY
PREFILLED_SYRINGE | INTRAVENOUS | Status: DC | PRN
  Administered 2021-10-28 (×2): 5 mg via INTRAVENOUS
  Administered 2021-10-28 (×2): 10 mg via INTRAVENOUS

## 2021-10-28 MED ORDER — ALUM & MAG HYDROXIDE-SIMETH 200-200-20 MG/5ML PO SUSP: 15.0000 mL | ORAL | Status: DC | PRN

## 2021-10-28 MED ORDER — SUGAMMADEX SODIUM 200 MG/2ML IV SOLN
INTRAVENOUS | Status: DC | PRN
  Administered 2021-10-28: 400 mg via INTRAVENOUS

## 2021-10-28 MED ORDER — ONDANSETRON HCL 4 MG/2ML IJ SOLN
INTRAMUSCULAR | Status: AC
  Filled 2021-10-28: qty 2

## 2021-10-28 MED ORDER — FUROSEMIDE 20 MG PO TABS
20.0000 mg | ORAL_TABLET | Freq: Every day | ORAL | Status: DC
  Administered 2021-10-28 – 2021-10-29 (×2): 20 mg via ORAL
  Filled 2021-10-28 (×2): qty 1

## 2021-10-28 MED ORDER — 0.9 % SODIUM CHLORIDE (POUR BTL) OPTIME
TOPICAL | Status: DC | PRN
  Administered 2021-10-28: 1000 mL

## 2021-10-28 MED ORDER — DEXAMETHASONE SODIUM PHOSPHATE 10 MG/ML IJ SOLN
INTRAMUSCULAR | Status: AC
  Filled 2021-10-28: qty 1

## 2021-10-28 MED ORDER — CEFAZOLIN SODIUM-DEXTROSE 2-4 GM/100ML-% IV SOLN
2.0000 g | INTRAVENOUS | Status: AC
  Administered 2021-10-28: 2 g via INTRAVENOUS
  Filled 2021-10-28: qty 100

## 2021-10-28 MED ORDER — ENOXAPARIN SODIUM 120 MG/0.8ML IJ SOSY
120.0000 mg | PREFILLED_SYRINGE | Freq: Two times a day (BID) | INTRAMUSCULAR | Status: DC
  Administered 2021-10-28 – 2021-10-29 (×2): 120 mg via SUBCUTANEOUS
  Filled 2021-10-28 (×3): qty 0.8

## 2021-10-28 MED ORDER — HYDRALAZINE HCL 20 MG/ML IJ SOLN: 5.0000 mg | INTRAMUSCULAR | Status: DC | PRN

## 2021-10-28 MED ORDER — ONDANSETRON HCL 4 MG/2ML IJ SOLN
INTRAMUSCULAR | Status: DC | PRN
Start: 2021-10-28 — End: 2021-10-28
  Administered 2021-10-28: 4 mg via INTRAVENOUS

## 2021-10-28 MED ORDER — EPHEDRINE 5 MG/ML INJ
INTRAVENOUS | Status: AC
  Filled 2021-10-28: qty 10

## 2021-10-28 MED ORDER — FENTANYL CITRATE (PF) 100 MCG/2ML IJ SOLN: 25.0000 ug | INTRAMUSCULAR | Status: DC | PRN

## 2021-10-28 MED ORDER — SODIUM CHLORIDE 0.9% FLUSH: 3.0000 mL | INTRAVENOUS | Status: DC | PRN

## 2021-10-28 MED ORDER — PROPOFOL 10 MG/ML IV BOLUS
INTRAVENOUS | Status: DC | PRN
  Administered 2021-10-28: 40 mg via INTRAVENOUS
  Administered 2021-10-28: 20 mg via INTRAVENOUS
  Administered 2021-10-28: 120 mg via INTRAVENOUS

## 2021-10-28 MED ORDER — MAGNESIUM SULFATE 2 GM/50ML IV SOLN: 2.0000 g | Freq: Every day | INTRAVENOUS | Status: DC | PRN

## 2021-10-28 MED ORDER — DOCUSATE SODIUM 100 MG PO CAPS
100.0000 mg | ORAL_CAPSULE | Freq: Every day | ORAL | Status: DC
  Administered 2021-10-29: 100 mg via ORAL
  Filled 2021-10-28: qty 1

## 2021-10-28 MED ORDER — PHENOL 1.4 % MT LIQD: 1.0000 | OROMUCOSAL | Status: DC | PRN

## 2021-10-28 MED ORDER — POTASSIUM CHLORIDE CRYS ER 20 MEQ PO TBCR: 20.0000 meq | EXTENDED_RELEASE_TABLET | Freq: Every day | ORAL | Status: DC | PRN

## 2021-10-28 MED ORDER — HEPARIN 6000 UNIT IRRIGATION SOLUTION
Status: DC | PRN
  Administered 2021-10-28: 1

## 2021-10-28 MED ORDER — HEPARIN SODIUM (PORCINE) 1000 UNIT/ML IJ SOLN
INTRAMUSCULAR | Status: DC | PRN
  Administered 2021-10-28: 10000 [IU] via INTRAVENOUS

## 2021-10-28 MED ORDER — SODIUM CHLORIDE 0.9 % IV SOLN: 250.0000 mL | INTRAVENOUS | Status: DC | PRN

## 2021-10-28 SURGICAL SUPPLY — 64 items
ADH SKN CLS APL DERMABOND .7 (GAUZE/BANDAGES/DRESSINGS) ×2
APL PRP STRL LF DISP 70% ISPRP (MISCELLANEOUS) ×1
BAG COUNTER SPONGE SURGICOUNT (BAG) ×2 IMPLANT
BAG SPNG CNTER NS LX DISP (BAG) ×1
BLADE CLIPPER SURG (BLADE) ×2 IMPLANT
CANISTER SUCT 3000ML PPV (MISCELLANEOUS) ×2 IMPLANT
CATH BEACON 5.038 65CM KMP-01 (CATHETERS) ×2 IMPLANT
CATH OMNI FLUSH .035X70CM (CATHETERS) ×2 IMPLANT
CHLORAPREP W/TINT 26 (MISCELLANEOUS) ×2 IMPLANT
CLOSURE PERCLOSE PROSTYLE (VASCULAR PRODUCTS) ×6 IMPLANT
COIL EMBL 14X103.2 LOOP (Embolic) IMPLANT
COIL NESTER 14X10 (Embolic) ×6 IMPLANT
COIL NESTER 14X12 (Embolic) ×4 IMPLANT
DERMABOND ADVANCED (GAUZE/BANDAGES/DRESSINGS) ×2
DERMABOND ADVANCED .7 DNX12 (GAUZE/BANDAGES/DRESSINGS) ×1 IMPLANT
DEVICE CLOSURE PERCLS PRGLD 6F (VASCULAR PRODUCTS) IMPLANT
DEVICE TORQUE H2O (MISCELLANEOUS) ×1 IMPLANT
DRSG IV TEGADERM 3.5X4.5 STRL (GAUZE/BANDAGES/DRESSINGS) ×2 IMPLANT
DRSG TEGADERM 2-3/8X2-3/4 SM (GAUZE/BANDAGES/DRESSINGS) ×4 IMPLANT
DRYSEAL FLEXSHEATH 12FR 33CM (SHEATH) ×1
DRYSEAL FLEXSHEATH 18FR 33CM (SHEATH) ×1
ELECT REM PT RETURN 9FT ADLT (ELECTROSURGICAL) ×4
ELECTRODE REM PT RTRN 9FT ADLT (ELECTROSURGICAL) ×2 IMPLANT
EXCLUDER TNK LEG 28MX12X18 (Endovascular Graft) IMPLANT
EXCLUDER TRUNK LEG 28MX12X18 (Endovascular Graft) ×2 IMPLANT
GAUZE SPONGE 2X2 8PLY STRL LF (GAUZE/BANDAGES/DRESSINGS) ×2 IMPLANT
GLOVE SURG SS PI 8.0 STRL IVOR (GLOVE) ×2 IMPLANT
GOWN STRL REUS W/ TWL LRG LVL3 (GOWN DISPOSABLE) ×2 IMPLANT
GOWN STRL REUS W/ TWL XL LVL3 (GOWN DISPOSABLE) ×1 IMPLANT
GOWN STRL REUS W/TWL LRG LVL3 (GOWN DISPOSABLE) ×4
GOWN STRL REUS W/TWL XL LVL3 (GOWN DISPOSABLE) ×2
GRAFT BALLN CATH 65CM (STENTS) ×1 IMPLANT
GUIDEWIRE ANGLED .035X150CM (WIRE) ×1 IMPLANT
KIT BASIN OR (CUSTOM PROCEDURE TRAY) ×2 IMPLANT
KIT DRAIN CSF ACCUDRAIN (MISCELLANEOUS) IMPLANT
KIT TURNOVER KIT B (KITS) ×2 IMPLANT
LEG CONTRALATERAL 16X14.5X12 (Vascular Products) ×1 IMPLANT
NS IRRIG 1000ML POUR BTL (IV SOLUTION) ×2 IMPLANT
PACK ENDOVASCULAR (PACKS) ×2 IMPLANT
PAD ARMBOARD 7.5X6 YLW CONV (MISCELLANEOUS) ×4 IMPLANT
PERCLOSE PROGLIDE 6F (VASCULAR PRODUCTS)
SET MICROPUNCTURE 5F STIFF (MISCELLANEOUS) ×3 IMPLANT
SHEATH BRITE TIP 6FR 35CM (SHEATH) ×1 IMPLANT
SHEATH BRITE TIP 8FR 23CM (SHEATH) ×2 IMPLANT
SHEATH DRYSEAL FLEX 12FR 33CM (SHEATH) IMPLANT
SHEATH DRYSEAL FLEX 18FR 33CM (SHEATH) IMPLANT
SHEATH PINNACLE 8F 10CM (SHEATH) ×3 IMPLANT
SPONGE GAUZE 2X2 8PLY STRL LF (GAUZE/BANDAGES/DRESSINGS) ×1 IMPLANT
SPONGE GAUZE 2X2 STER 10/PKG (GAUZE/BANDAGES/DRESSINGS) ×1
STENT GRAFT BALLN CATH 65CM (STENTS) ×2
STOPCOCK MORSE 400PSI 3WAY (MISCELLANEOUS) ×2 IMPLANT
SUT MNCRL AB 4-0 PS2 18 (SUTURE) ×4 IMPLANT
SUT PROLENE 5 0 C 1 24 (SUTURE) IMPLANT
SUT VIC AB 2-0 CT1 27 (SUTURE)
SUT VIC AB 2-0 CT1 TAPERPNT 27 (SUTURE) IMPLANT
SUT VIC AB 3-0 SH 27 (SUTURE)
SUT VIC AB 3-0 SH 27X BRD (SUTURE) IMPLANT
SYR 20CC LL (SYRINGE) ×4 IMPLANT
TOWEL GREEN STERILE (TOWEL DISPOSABLE) ×2 IMPLANT
TRAY FOLEY MTR SLVR 16FR STAT (SET/KITS/TRAYS/PACK) ×2 IMPLANT
TUBING INJECTOR 48 (MISCELLANEOUS) ×2 IMPLANT
WIRE BENTSON .035X145CM (WIRE) ×4 IMPLANT
WIRE LUNDERQUIST .035X180CM (WIRE) ×4 IMPLANT
WIRE ROSEN-J .035X260CM (WIRE) ×1 IMPLANT

## 2021-10-28 NOTE — Op Note (Signed)
DATE OF SERVICE: 10/28/2021 ? ?PATIENT:  [name]  87 y.o. male ? ?PRE-OPERATIVE DIAGNOSIS:  right common iliac artery aneurysm ? ?POST-OPERATIVE DIAGNOSIS:  Same ? ?PROCEDURE:   ?1) bilateral common femoral artery access and large bore closure (CPT [phone]) ?2) coil embolization of right hypogastric artery (CPT [phone]) ?3) endovascular aortoiliac aneurysm repair (EVAR - CPT [phone]) ? ?SURGEON:  Surgeon(s) and Role: ?   * Cherre Robins, MD - Primary ?    ?ASSISTANT: Theotis Burrow, MD ? Paulo Fruit, PA-C ? ?An experienced assistant was required given the complexity of this procedure and the standard of surgical care. My assistant helped with exposure through counter tension, suctioning, ligation and retraction to better visualize the surgical field.  My assistant expedited sewing during the case by following my sutures. Wherever I use the term "we" in the report, my assistant actively helped me with that portion of the procedure. ? ?ANESTHESIA:   general ? ?EBL: minimal ? ?BLOOD ADMINISTERED:none ? ?DRAINS: none  ? ?LOCAL MEDICATIONS USED:  NONE ? ?SPECIMEN:  none ? ?COUNTS: confirmed correct. ? ?TOURNIQUET:  none ? ?PATIENT DISPOSITION:  PACU - hemodynamically stable. ?  ?Delay start of Pharmacological VTE agent (>24hrs) due to surgical blood loss or risk of bleeding: no ? ?INDICATION FOR PROCEDURE: [name] is a 87 y.o. male with large (40+ mm) right common iliac artery aneurysm. After careful discussion of risks, benefits, and alternatives the patient was offered EVAR. The patient understood and wished to proceed. ? ?OPERATIVE FINDINGS: unremarkable right hypogastric artery coiling. Good technical result from EVAR. No type I, III endoleak. Type II endoleak at completion.  ? ?ENDOVASCULAR EQUIPMENT USED: ?28 x 12 x 181m Excluder main body via right common femoral artery access ?14 x 1266miliac extension limb via left common femoral artery access ?Multiple 10 and 1231mestor coils into right hypogastric  artery. ? ?DESCRIPTION OF PROCEDURE: After identification of the patient in the pre-operative holding area, the patient was transferred to the operating room. The patient was positioned supine on the operating room table. Anesthesia was induced. The abdomen, groins and thighs were prepped and draped in standard fashion. A surgical pause was performed confirming correct patient, procedure, and operative location. ? ?Using ultrasound guidance, micropuncture needle was introduced in the left common femoral artery.  An 014 wire was navigated into the iliac arteries.  The wire was withdrawn and a micro sheath introduced in the common femoral artery.  A Bentson wire was navigated into the aorta.  The micro sheath was withdrawn.  A 8 French dilator was introduced in the common femoral artery.  A small incision was made about the dilator to allow large-bore access.  A Perclose device was placed at 10:00 and 2:00 okay in the artery and secured with a shod for later use. ? ?Using ultrasound guidance, micropuncture needle was introduced in the right common femoral artery.  An 014 wire was navigated into the iliac arteries.  The wire was withdrawn and a micro sheath introduced in the common femoral artery.  A Bentson wire was navigated into the aorta.  The micro sheath was withdrawn.  A 8 French dilator was introduced in the common femoral artery.  A small incision was made about the dilator to allow large-bore access.  A Perclose device was placed at 10:00 and 2:00 okay in the artery and secured with a shod for later use. ? ?The Omni Flush and Bentson wire were used to come up and over the aortic  bifurcation from the left.  A Glidewire used to select the right hypogastric artery.  We navigated a KMP catheter into the hypogastric artery and exchanged wires for a Rosen wire.  A 6 French Brite tip sheath was then delivered through the 8 [place] access in the left common femoral artery and delivered into the origin of the right  hypogastric artery.  We then coil embolized the anterior and posterior division of the hypogastric artery using multiple Nester coils measuring 10 and 12 mm.  Good technical result was noted on angiogram.  ? ?Lunderquist wires were delivered into the descending thoracic aorta bilaterally. We upsized our left common femoral access to a 12 French dry seal sheath.  We upsized the right common femoral artery access to 18 French dry seal sheath.  The patient was systemically heparinized.  Activated clotting time measurements were used throughout the case to confirm adequate anticoagulation.  The main body of the device was navigated to the takeoff of the accessory right renal artery at L2.  An Omni Flush catheter was introduced through the left common femoral artery access over a wire.  The wire was removed and a angiogram performed.  We marked the takeoff of our renal vessels and deployed the main body partially in standard fashion.  We took great care to position the gate and a anterior tilt to allow easier cannulation. ? ?The Omni Flush catheter was removed over the wire.  A Glidewire was introduced through the left common femoral artery sheath.  The gate was cannulated.  The Omni Flush catheter was delivered over the wire into the main body of the endoprosthesis.  The wire was withdrawn and the catheter spun in the main body freely without difficulty.  A Lunderquist wire was then delivered into the descending thoracic aorta. ? ?The left sheath was withdrawn into the external iliac artery.  The Omni Flush was withdrawn to allow measurement of the contralateral limb.  A retrograde angiogram was performed via the left common femoral artery sheath.  The takeoff of the left hypogastric artery was marked on the screen.  A 14 x 120 mm iliac extension limb was delivered over the wire and deployed in standard fashion.  ? ?We completed deployment of the main body via the right common femoral access. A molding and occlusion  balloon was used to angioplasty all seal points and overlap areas.  ? ?A completion angiogram was performed in two projections. I saw no evidence of a Type 1 or Type 3 endoleak. A type 2 endoleak was noted. We planned to observe this as an outpatient.  ? ?The previously placed Perclose sutures were secured after removing the large bore access.  Good hemostasis was achieved.  The wires were withdrawn and the sutures secured.  Manual pressure was held on both groins.  The feet were checked for Doppler flow.  Brisk Doppler flow was noted in both posterior tibial arteries bilaterally.  Satisfied we know the case here.  Heparin was reversed with protamine.  The Perclose suture material was trimmed.  The groins were closed using simple Monocryl sutures.  Dermabond was applied to the incisions. ? ?Upon completion of the case instrument and sharps counts were confirmed correct. The patient was transferred to the PACU in good condition. I was present for all portions of the procedure. ? ?Yevonne Aline. Stanford Breed, MD ?[facility] ?Office Phone Number: [phone] ?10/28/2021 10:54 AM ? ? ? ?

## 2021-10-28 NOTE — Anesthesia Procedure Notes (Signed)
Arterial Line Insertion ?Start/End4/26/2023 7:15 AM ?Performed by: Lance Coon, CRNA, CRNA ? Preanesthetic checklist: patient identified, IV checked, surgical consent, monitors and equipment checked and pre-op evaluation ?Lidocaine 1% used for infiltration ?Right, radial was placed ?Catheter size: 20 G ?Hand hygiene performed  and maximum sterile barriers used  ? ?Attempts: 1 ?Procedure performed without using ultrasound guided technique. ?Following insertion, dressing applied and Biopatch. ?Post procedure assessment: normal ? ?Patient tolerated the procedure well with no immediate complications. ? ? ?

## 2021-10-28 NOTE — Progress Notes (Signed)
10/28/2021 ?1415 ?Received pt to room 4E-16 from PACU S/P AAA  repair.  Pt is A&O, no C/O voiced other than a little muscle soreness in the L shoulder.  Tele monitor applied and CCMD notified.  CHG bath given.  Bilateral groin sites are level zero.  Instructed pt on bed rest till 3pm.  Oriented to room, call light and bed.  Call bell in reach. ?[name] C ? ?

## 2021-10-28 NOTE — Discharge Instructions (Signed)
  [facility] [place]   Discharge Instructions  Endovascular Aortic Aneurysm Repair  Please refer to the following instructions for your post-procedure care. Your surgeon or Physician Assistant will discuss any changes with you.  Activity  You are encouraged to walk as much as you can. You can slowly return to normal activities but must avoid strenuous activity and heavy lifting until your doctor tells you it's OK. Avoid activities such as vacuuming or swinging a gold club. It is normal to feel tired for several weeks after your surgery. Do not drive until your doctor gives the OK and you are no longer taking prescription pain medications. It is also normal to have difficulty with sleep habits, eating, and bowel movements after surgery. These will go away with time.  Bathing/Showering  Shower daily after you go home.  Do not soak in a bathtub, hot tub, or swim until the incision heals completely.  If you have incisions in your groin, wash the groin wounds with soap and water daily and pat dry. (No tub bath-only shower)  Then put a dry gauze or washcloth there to keep this area dry to help prevent wound infection daily and as needed.  Do not use Vaseline or neosporin on your incisions.  Only use soap and water on your incisions and then protect and keep dry.  Incision Care  Shower every day. Clean your incision with mild soap and water. Pat the area dry with a clean towel. You do not need a bandage unless otherwise instructed. Do not apply any ointments or creams to your incision. If you clothing is irritating, you may cover your incision with a dry gauze pad.  Diet  Resume your normal diet. There are no special food restrictions following this procedure. A low fat/low cholesterol diet is recommended for all patients with vascular disease. In order to heal from your surgery, it is CRITICAL to get adequate nutrition. Your body requires vitamins, minerals, and protein.  Vegetables are the best source of vitamins and minerals. Vegetables also provide the perfect balance of protein. Processed food has little nutritional value, so try to avoid this.  Medications  Resume taking all of your medications unless your doctor or nurse practitioner tells you not to. If your incision is causing pain, you may take over-the-counter pain relievers such as acetaminophen (Tylenol). If you were prescribed a stronger pain medication, please be aware these medications can cause nausea and constipation. Prevent nausea by taking the medication with a snack or meal. Avoid constipation by drinking plenty of fluids and eating foods with a high amount of fiber, such as fruits, vegetables, and grains.  Do not take Tylenol if you are taking prescription pain medications.   Follow up  Our office will schedule a follow-up appointment with a CT scan 3-4 weeks after your surgery.  Please call us immediately for any of the following conditions  Severe or worsening pain in your legs or feet or in your abdomen back or chest. Increased pain, redness, drainage (pus) from your incision site. Increased abdominal pain, bloating, nausea, vomiting or persistent diarrhea. Fever of 101 degrees or higher. Swelling in your leg (s),  Reduce your risk of vascular disease  Stop smoking. If you would like help call [facility] at 1-800-QUIT-NOW (1-800-784-8669) or [place] at 336-586-4000. Manage your cholesterol Maintain a desired weight Control your diabetes Keep your blood pressure down  If you have questions, please call the office at [phone].  

## 2021-10-28 NOTE — H&P (Signed)
[facility] [place] ? ?ASSESSMENT / PLAN: ?[name] is a 87 y.o. male with a 30m right common iliac artery aneurysm.  ? ?The patient is a candidate for elective repair of the aneurysm to prevent rupture. ? ?I explained the risks / benefits / alternatives to different approaches to aortic reconstruction.  ? ?Recommend the following to reduce the risk of major adverse cardiac / limb events. ? ?Complete cessation from all tobacco products. ?Excellent blood glucose control with goal A1c < 7%. ?Blood pressure control with goal blood pressure < 140/90 mmHg. ?Excellent lipid reduction therapy with goal LDL-C <100 mg/dL. ?Aspirin '81mg'$  PO QD.  ?Atorvastatin 40-'80mg'$  PO QD (or other "high intensity" statin therapy). ? ?EVAR today. ? ?CHIEF COMPLAINT: Right common iliac artery aneurysm demonstrated incidentally ? ?HISTORY OF PRESENT ILLNESS: ?Calvin Hallstromis a 87y.o. male who presents to clinic for discussion of right common iliac artery aneurysm.  The patient is unaware that he had this finding on his recent PET/CT scan, done for surveillance of prostate cancer.  I reviewed the images with the patient.  We had a lengthy discussion about the natural history of aneurysm disease, and the rationale for treatment.  He is thankfully asymptomatic from an aneurysm standpoint. ? ?10/28/21: patient presents for EVAR. All questions answered. ? ?Past Medical History:  ?Diagnosis Date  ? (HFimpEF) heart failure with improved EF   ? Echo 04/2019: EF 35-40 // Echo 5/22: EF 55-60, no RWMA, GR 1 DD, moderate to severe MR, myxomatous mitral valve, similar to prior echocardiogram, mildly elevated PASP, RVSP 35.9, mild AI, mild AV sclerosis without AS, mild dilation of ascending aorta (43 mm)  ? [place]   ? RVR-DCCV  ? Abnormal PSA 01/17/2012  ? 10.38/4/5/ PSA:2013=6.96/ PSA 07/08/2011=6.26/ PSA 2006=8.6  ? [place]   ? 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 Aortic insufficiency 10/01/2021  ? Echocardiogram  March 2023: mild to mod AI  ? Ascending aorta dilation ([place] 09/16/2021  ? Echocardiogram March 2023: 41 mm  ? Blood transfusion without reported diagnosis   ? during vagotomy 1976  ? Cancer ([facility] 2006  ? L-2 solitary metastatic lesion/no rad tx ,asymptomatic  ? Chronic kidney disease   ? nephrolithiasis right kidney  ? Coronary artery disease   ? ED (erectile dysfunction)   ? GERD (gastroesophageal reflux disease)   ? PUD S/P PARTIAL VAGOTOMY  ? Gynecomastia 04/21/2011  ? seen by Dr.Murray, no rad tx  ? H/O impacted cerumen   ? S/P ENT DR BATES  ? History of echocardiogram   ? echo 4/16:  mild LVH, EF 55-60%, Gr 1 DD, no RWMA, mild AI, mild MR, mod LAE, mild TR  ? Hx of cardiovascular stress test   ? Myoview 5/16:  EF 50%, no scar or ischemia, low risk  ? Hydroureteronephrosis   ? right   ? Hyperglycemia   ? WITHOUT DM2  ? Hyperlipidemia   ? Hypertension   ? Insomnia   ? Migraines   ? WITH AURA  ? Mitral regurgitation 08/10/2018  ? Echo 11/04/20: EF 55-60, no RWMA, Gr 1 DD, mod to severe MR, normal RVSF, RVSP 35.9, mild LAE, mild AI, AV sclerosis w/o AS, ascending aorta 43 mm (mild dilation) Echo 3/23: EF 60-65, no RWMA, mild LVH, GR 1 DD, normal RVSF, mildly elevated PASP, moderate LAE, moderate MR, mild to moderate AI, dilated ascending aorta (41 mm), dilated aortic root (40 mm), RVSP 36.4  ? Nephrolithiasis   ?  Osteopenia   ? Overweight   ? Prostate cancer ([place]) 12/09/03 dx  ? Prostate ca/adenocarcinoma,gleason=3+3=6    pSA 4.6  ? Prostate cancer ([place])   ? METASTATIC WITH L2 SCLEROTIC LESION ON LUPRON------DR. WOODRUFF  ? RBBB (right bundle branch block)   ? Rosacea   ? Sinus bradycardia   ? Sleep apnea   ? pt unaware, no sleep study  ? Stroke [facility])   ? asymptomatic, found on head imaging  ? Ulcer   ? peptic  ? Use of leuprolide acetate (Lupron) 06/2005-11/2006  ? 02/2008 degarelix 02/2008 x 3 doses  ? ? ?Past Surgical History:  ?Procedure Laterality Date  ? CARDIOVERSION N/A 06/19/2018  ? Procedure: CARDIOVERSION;   Surgeon: Larey Dresser, MD;  Location: [facility] ENDOSCOPY;  Service: Cardiovascular;  Laterality: N/A;  ? PROSTATE BIOPSY  12/09/03  ? adenocarcinoma,glerason: 3+3=6  ? RIGHT/LEFT HEART CATH AND CORONARY ANGIOGRAPHY N/A 04/25/2019  ? Procedure: RIGHT/LEFT HEART CATH AND CORONARY ANGIOGRAPHY;  Surgeon: Leonie Man, MD;  Location: [place] CV LAB;  Service: Cardiovascular;  Laterality: N/A;  ? TEE WITHOUT CARDIOVERSION N/A 06/19/2018  ? Procedure: TRANSESOPHAGEAL ECHOCARDIOGRAM (TEE);  Surgeon: Larey Dresser, MD;  Location: [facility] ENDOSCOPY;  Service: Cardiovascular;  Laterality: N/A;  ? VAGOTOMY  1978  ? partial  ? ? ?Family History  ?Problem Relation Age of Onset  ? Liver cancer Father   ? Kidney Stones Father   ? Colon cancer Neg Hx   ? Stroke Neg Hx   ? ? ?Social History  ? ?Socioeconomic History  ? Marital status: Widowed  ?  Spouse name: Not on file  ? Number of children: 2  ? Years of education: Not on file  ? Highest education level: Not on file  ?Occupational History  ? 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 Smoking status: Former  ?  Types: Pipe  ?  Quit date: 07/05/1984  ?  Years since quitting: 37.3  ? Smokeless tobacco: Never  ?Vaping Use  ? Vaping Use: Never used  ?Substance and Sexual Activity  ? Alcohol use: No  ? Drug use: No  ? Sexual activity: Never  ?Other Topics Concern  ? Not on file  ?Social History Narrative  ? Pt lives in [place], alone.  Widower.  ? Retired Animator.  ? ?Social Determinants of Health  ? ?Financial Resource Strain: Not on file  ?Food Insecurity: Not on file  ?Transportation Needs: Not on file  ?Physical Activity: Not on file  ?Stress: Not on file  ?Social Connections: Not on file  ?Intimate Partner Violence: Not on file  ? ? ?Allergies  ?Allergen Reactions  ? Aspirin Other (See Comments)  ?  History of "stomach ulcers," was told to not take this   ? ? ?Current Facility-Administered Medications  ?Medication Dose Route Frequency  Provider Last Rate Last Admin  ? 0.9 %  sodium chloride infusion   Intravenous Continuous Cherre Robins, MD      ? ceFAZolin (ANCEF) IVPB 2g/100 mL premix  2 g Intravenous 30 min Pre-Op Cherre Robins, MD      ? Chlorhexidine Gluconate Cloth 2 % PADS 6 each  6 each Topical Once Cherre Robins, MD      ? And  ? Chlorhexidine Gluconate Cloth 2 % PADS 6 each  6 each Topical Once Cherre Robins, MD      ? lactated ringers infusion   Intravenous Continuous Fransisco Beau,  Melanie Crazier, MD      ? metoprolol succinate (TOPROL-XL) 24 hr tablet 50 mg  50 mg Oral Daily Suzette Battiest, MD   50 mg at 10/28/21 0703  ? ? ?REVIEW OF SYSTEMS:  ?'[X]'$  denotes positive finding, '[ ]'$  denotes negative finding ?Cardiac  Comments:  ?Chest pain or chest pressure:    ?Shortness of breath upon exertion:    ?Short of breath when lying flat:    ?Irregular heart rhythm:    ?    ?Vascular    ?Pain in calf, thigh, or hip brought on by ambulation:    ?Pain in feet at night that wakes you up from your sleep:     ?Blood clot in your veins:    ?Leg swelling:     ?    ?Pulmonary    ?Oxygen at home:    ?Productive cough:     ?Wheezing:     ?    ?Neurologic    ?Sudden weakness in arms or legs:     ?Sudden numbness in arms or legs:     ?Sudden onset of difficulty speaking or slurred speech:    ?Temporary loss of vision in one eye:     ?Problems with dizziness:     ?    ?Gastrointestinal    ?Blood in stool:     ?Vomited blood:     ?    ?Genitourinary    ?Burning when urinating:     ?Blood in urine:    ?    ?Psychiatric    ?Major depression:     ?    ?Hematologic    ?Bleeding problems:    ?Problems with blood clotting too easily:    ?    ?Skin    ?Rashes or ulcers:    ?    ?Constitutional    ?Fever or chills:    ? ? ?PHYSICAL EXAM ?Vitals:  ? 10/28/21 0557  ?BP: (!) 186/91  ?Pulse: 90  ?Resp: 18  ?Temp: 97.9 ?F (36.6 ?C)  ?TempSrc: Oral  ?SpO2: 94%  ?Weight: 108.9 kg  ?Height: '5\' 11"'$  (1.803 m)  ? ? ?Constitutional: well appearing. no distress. Appears well  nourished.  ?Neurologic: CN intact. no focal findings. no sensory loss. ?Psychiatric:  Mood and affect symmetric and appropriate. ?Eyes:  No icterus. No conjunctival pallor. ?Ears, nose, throat:  mucous [place]

## 2021-10-28 NOTE — Progress Notes (Signed)
Anesthesiologist and CRNA made aware that blood bank is still working on patient's Type and Screen and that blood will not be ready for about 30-45 minutes per tech in the blood bank.  ?

## 2021-10-28 NOTE — Anesthesia Procedure Notes (Signed)
Procedure Name: Intubation ?Date/Time: 10/28/2021 8:41 AM ?Performed by: Janace Litten, CRNA ?Pre-anesthesia Checklist: Patient identified, Emergency Drugs available, Suction available and Patient being monitored ?Patient Re-evaluated:Patient Re-evaluated prior to induction ?Oxygen Delivery Method: Circle System Utilized ?Preoxygenation: Pre-oxygenation with 100% oxygen ?Induction Type: IV induction ?Ventilation: Mask ventilation without difficulty ?Laryngoscope Size: Mac and 4 ?Grade View: Grade I ?Tube type: Oral ?Tube size: 7.5 mm ?Number of attempts: 1 ?Airway Equipment and Method: Oral airway ?Placement Confirmation: ETT inserted through vocal cords under direct vision, positive ETCO2 and breath sounds checked- equal and bilateral ?Secured at: 23 cm ?Tube secured with: Tape ?Dental Injury: Teeth and Oropharynx as per pre-operative assessment  ? ? ? ? ?

## 2021-10-28 NOTE — Progress Notes (Signed)
Dr. Deatra Canter made aware that patient did not take Metoprolol 50 mg this morning. Verbal order received to give Metoprolol XL 50 mg.  ?

## 2021-10-28 NOTE — Transfer of Care (Signed)
Immediate Anesthesia Transfer of Care Note ? ?Patient: [name] ? ?Procedure(s) Performed: ABDOMINAL AORTIC ENDOVASCULAR GRAFT WITH RIGHT EPIGASTRIC ARTERY COILING (Right: Abdomen) ? ?Patient Location: PACU ? ?Anesthesia Type:General ? ?Level of Consciousness: drowsy, patient cooperative and responds to stimulation ? ?Airway & Oxygen Therapy: Patient Spontanous Breathing and Patient connected to nasal cannula oxygen ? ?Post-op Assessment: Report given to RN and Post -op Vital signs reviewed and stable ? ?Post vital signs: Reviewed and stable ? ?Last Vitals:  ?Vitals Value Taken Time  ?BP 156/104 10/28/21 1109  ?Temp    ?Pulse 69 10/28/21 1113  ?Resp 13 10/28/21 1113  ?SpO2 95 % 10/28/21 1113  ?Vitals shown include unvalidated device data. ? ?Last Pain:  ?Vitals:  ? 10/28/21 0635  ?TempSrc:   ?PainSc: 0-No pain  ?   ? ?Patients Stated Pain Goal: 1 (10/28/21 2585) ? ?Complications: No notable events documented. ?

## 2021-10-29 ENCOUNTER — Encounter (HOSPITAL_COMMUNITY): Payer: Self-pay | Admitting: Vascular Surgery

## 2021-10-29 LAB — BASIC METABOLIC PANEL
Anion gap: 8 (ref 5–15)
BUN: 14 mg/dL (ref 8–23)
CO2: 26 mmol/L (ref 22–32)
Calcium: 8.8 mg/dL — ABNORMAL LOW (ref 8.9–10.3)
Chloride: 105 mmol/L (ref 98–111)
Creatinine, Ser: 1.05 mg/dL (ref 0.61–1.24)
GFR, Estimated: >60 mL/min (ref >60)
Glucose, Bld: 152 mg/dL — ABNORMAL HIGH (ref 70–99)
Potassium: 4 mmol/L (ref 3.5–5.1)
Sodium: 139 mmol/L (ref 135–145)

## 2021-10-29 LAB — CBC
HCT: 37.2 % — ABNORMAL LOW (ref 39.0–52.0)
Hemoglobin: 12.5 g/dL — ABNORMAL LOW (ref 13.0–17.0)
MCH: 30.3 pg (ref 26.0–34.0)
MCHC: 33.6 g/dL (ref 30.0–36.0)
MCV: 90.3 fL (ref 80.0–100.0)
Platelets: 155 10*3/uL (ref 150–400)
RBC: 4.12 MIL/uL — ABNORMAL LOW (ref 4.22–5.81)
RDW: 13.2 % (ref 11.5–15.5)
WBC: 8.2 10*3/uL (ref 4.0–10.5)
nRBC: 0 % (ref 0.0–0.2)

## 2021-10-29 MED ORDER — ASPIRIN 81 MG PO TBEC: 81.0000 mg | DELAYED_RELEASE_TABLET | Freq: Every day | ORAL | 11 refills | Status: DC

## 2021-10-29 MED ORDER — TRAMADOL HCL 50 MG PO TABS: 50.0000 mg | ORAL_TABLET | Freq: Four times a day (QID) | ORAL | 0 refills | Status: DC | PRN

## 2021-10-29 NOTE — TOC Transition Note (Signed)
Transition of Care (TOC) - CM/SW Discharge Note ?Marvetta Gibbons Therapist, sports, BSN ?Transitions of Care ?Unit 4E- RN Case Manager ?See Treatment Team for direct phone #  ? ? ?Patient Details  ?Name: [name] ?MRN: [id number] ?Date of Birth: Jan 02, 1935 ? ?Transition of Care (TOC) CM/SW Contact:  ?Dahlia Client, Romeo Rabon, RN ?Phone Number: ?10/29/2021, 11:16 AM ? ? ?Clinical Narrative:    ?Pt s/p EVAR, from home, stable to transition back home today. Transition of Care Department [facility]) has reviewed patient and no TOC needs have been identified at this time.  ? ?Final next level of care: Home/Self Care ?Barriers to Discharge: No Barriers Identified ? ? ?Patient Goals and CMS Choice ?  ?  ?Choice offered to / list presented to : NA ? ?Discharge Placement ?  ?           ?  ? Home ?  ? ?Discharge Plan and Services ?  ?  ?Post Acute Care Choice: NA          ?DME Arranged: N/A ?DME Agency: NA ?  ?  ?  ?HH Arranged: NA ?[place] Agency: NA ?  ?  ?  ? ?Social Determinants of Health (SDOH) Interventions ?  ? ? ?Readmission Risk Interventions ? ?  10/29/2021  ? 11:16 AM  ?Readmission Risk Prevention Plan  ?Transportation Screening Complete  ?Home Care Screening Complete  ?Medication Review (RN CM) Complete  ? ? ? ? ? ?

## 2021-10-29 NOTE — Progress Notes (Addendum)
?  Progress Note ? ? ? ?10/29/2021 ?8:28 AM ?1 Day Post-Op ? ?Subjective:  no major complaints ? ? ?Vitals:  ? 10/29/21 0545 10/29/21 0726  ?BP:  136/70  ?Pulse: (!) 58 67  ?Resp: 16 17  ?Temp:  98.4 ?F (36.9 ?C)  ?SpO2: 94% 93%  ? ?Physical Exam: ?Cardiac:  regular ?Lungs:  non labored ?Extremities:  B groin  access sites c/d/I. Mild ecchymosis present R> L. Soft without hematoma ?Abdomen:  soft, non distended, non tender ?Neurologic: alert and oriented ? ?CBC ?   ?Component Value Date/Time  ? WBC 8.2 10/29/2021 0527  ? RBC 4.12 (L) 10/29/2021 0527  ? HGB 12.5 (L) 10/29/2021 0527  ? HGB 14.6 09/16/2021 1536  ? HCT 37.2 (L) 10/29/2021 0527  ? HCT 44.3 09/16/2021 1536  ? PLT 155 10/29/2021 0527  ? PLT 198 09/16/2021 1536  ? MCV 90.3 10/29/2021 0527  ? MCV 89 09/16/2021 1536  ? MCH 30.3 10/29/2021 0527  ? MCHC 33.6 10/29/2021 0527  ? RDW 13.2 10/29/2021 0527  ? RDW 13.5 09/16/2021 1536  ? LYMPHSABS 1.7 09/28/2020 2306  ? MONOABS 0.8 09/28/2020 2306  ? EOSABS 0.1 09/28/2020 2306  ? BASOSABS 0.1 09/28/2020 2306  ? ? ?BMET ?   ?Component Value Date/Time  ? NA 139 10/29/2021 0527  ? NA 141 09/16/2021 1536  ? K 4.0 10/29/2021 0527  ? CL 105 10/29/2021 0527  ? CO2 26 10/29/2021 0527  ? GLUCOSE 152 (H) 10/29/2021 0527  ? BUN 14 10/29/2021 0527  ? BUN 17 09/16/2021 1536  ? CREATININE 1.05 10/29/2021 0527  ? CALCIUM 8.8 (L) 10/29/2021 0527  ? GFRNONAA >60 10/29/2021 0527  ? GFRAA 87 08/25/2020 0958  ? ? ?INR ?   ?Component Value Date/Time  ? INR 1.1 10/28/2021 1130  ? ? ? ?Intake/Output Summary (Last 24 hours) at 10/29/2021 0828 ?Last data filed at 10/29/2021 0600 ?Gross per 24 hour  ?Intake 2214.73 ml  ?Output 1685 ml  ?Net 529.73 ml  ? ? ? ?Assessment/Plan:  87 y.o. male is s/p EVAR  1 Day Post-Op  ? ?B groins c/d/I without swelling or hematoma ?Lower extremities well perfused and warm ?Hemodynamically stable ?Foley d/c this morning. History of prostate issues with urinary incontinence. Has not voided since foley out ?Mobilize  this morning ?Anticipate discharge later this afternoon ?Will have follow up in 1 month with CTA abd/ pelvis ? ?Karoline Caldwell, PA-C ?[facility] ?[phone] ?10/29/2021 ?8:28 AM ? ?VASCULAR STAFF ADDENDUM: ?I have independently interviewed and examined the patient. ?I agree with the above.  ?Ready for discharge. ?Follow up with me in 1 month.  ?CT angiogram of abdomen and pelvis prior to follow up. ? ?Yevonne Aline. Stanford Breed, MD ?[facility] [place] ?Office Phone Number: [phone] ?10/29/2021 10:30 AM ? ? ?

## 2021-10-29 NOTE — Progress Notes (Signed)
Mobility Specialist Progress Note ? ? 10/29/21 1150  ?Mobility  ?Activity Ambulated independently in hallway  ?Level of Assistance Independent after set-up  ?Assistive Device None  ?Distance Ambulated (ft) 240 ft  ?Activity Response Tolerated well  ?$Mobility charge 1 Mobility  ? ?Pt received in bed having no complaints and agreeable. Ambulated w/o fault but stating they feel they could walk better w/ there shoes. Returned back to bed, placed call bell in reach, pt awaiting d/c.  ? ?Holland Falling ?Mobility Specialist ?Phone Number [phone] ? ?

## 2021-10-29 NOTE — Progress Notes (Signed)
Patient discharged with all belongings.  Removed from telemetry, CCMD aware. ? ?All IV lines removed. ?

## 2021-10-29 NOTE — Discharge Summary (Signed)
?EVAR Discharge Summary ? ? ?[name] ?[birth year] 87 y.o. male ? ?MRN: ?[id number] ? ?Admission Date: ?10/28/2021 ? ?Discharge Date: ?10/29/2021 ? ?Physician: ?Cherre Robins, MD ? ?Admission Diagnosis: ?Abdominal aortic aneurysm ([place]) [I71.40] ? ?Hospital Course:  ?The patient was admitted to the hospital and taken to the operating room on 10/28/2021 and underwent: ?1) bilateral common femoral artery access and large bore closure (CPT 34713) 2) coil embolization of right hypogastric artery (CPT [phone]) 3) endovascular aortoiliac aneurysm repair (EVAR - CPT [phone]) by Dr. Stanford Breed. ? ?The pt tolerated the procedure well and was transported to the PACU in good condition.  ? ?By POD#1, he remained stable. His bilateral groin access sites were without swelling or hematoma. He did have some ecchymosis present. His lower extremities were well perfused and warm with palpable pulses. He remained hemodynamically stable without any change in his renal function. He tolerated mobilizing with the mobility technician.  ? ?The remainder of the hospital course consisted of increasing mobilization and increasing intake of solids without difficulty. ? ?He remained stable for discharge post op Day 1. PDMP was reviewed and prescription for Tramadol was sent to his pharmacy. New prescription for Aspirin 81 mg was also sent to his pharmacy. He was otherwise instructed to resume home medications as prescribed. He will have follow up in 1 month with Dr. Stanford Breed with CTA abdomen and pelvis. ? ?CBC ?   ?Component Value Date/Time  ? WBC 8.2 10/29/2021 0527  ? RBC 4.12 (L) 10/29/2021 0527  ? HGB 12.5 (L) 10/29/2021 0527  ? HGB 14.6 09/16/2021 1536  ? HCT 37.2 (L) 10/29/2021 0527  ? HCT 44.3 09/16/2021 1536  ? PLT 155 10/29/2021 0527  ? PLT 198 09/16/2021 1536  ? MCV 90.3 10/29/2021 0527  ? MCV 89 09/16/2021 1536  ? MCH 30.3 10/29/2021 0527  ? MCHC 33.6 10/29/2021 0527  ? RDW 13.2 10/29/2021 0527  ? RDW 13.5 09/16/2021 1536  ? LYMPHSABS 1.7  09/28/2020 2306  ? MONOABS 0.8 09/28/2020 2306  ? EOSABS 0.1 09/28/2020 2306  ? BASOSABS 0.1 09/28/2020 2306  ? ? ?BMET ?   ?Component Value Date/Time  ? NA 139 10/29/2021 0527  ? NA 141 09/16/2021 1536  ? K 4.0 10/29/2021 0527  ? CL 105 10/29/2021 0527  ? CO2 26 10/29/2021 0527  ? GLUCOSE 152 (H) 10/29/2021 0527  ? BUN 14 10/29/2021 0527  ? BUN 17 09/16/2021 1536  ? CREATININE 1.05 10/29/2021 0527  ? CALCIUM 8.8 (L) 10/29/2021 0527  ? GFRNONAA >60 10/29/2021 0527  ? GFRAA 87 08/25/2020 0958  ? ? ? ? ? ?Discharge Instructions   ? ? Call MD for:  redness, tenderness, or signs of infection (pain, swelling, redness, odor or green/yellow discharge around incision site)   Complete by: As directed ?  ? Call MD for:  severe uncontrolled pain   Complete by: As directed ?  ? Call MD for:  temperature >100.4   Complete by: As directed ?  ? Diet - low sodium heart healthy   Complete by: As directed ?  ? Discharge patient   Complete by: As directed ?  ? Discharge disposition: 01-Home or Self Care  ? Discharge patient date: 10/29/2021  ? Discharge wound care:   Complete by: As directed ?  ? Wash both groins with mild soap and water, do not soap in bathtub  ? Driving Restrictions   Complete by: As directed ?  ? No driving while taking pain medication  ? Increase  activity slowly   Complete by: As directed ?  ? Lifting restrictions   Complete by: As directed ?  ? No heavy lifting, pushing, pulling> 10 lbs for 2 weeks  ? ?  ? ? ?Discharge Diagnosis:  ?Abdominal aortic aneurysm ([place]) [I71.40] ? ?Secondary Diagnosis: ?Patient Active Problem List  ? Diagnosis Date Noted  ? Abdominal aortic aneurysm ([place]) 10/28/2021  ? Aortic insufficiency 10/01/2021  ? Preoperative cardiovascular examination 09/16/2021  ? HFrEF (heart failure with reduced ejection fraction) ([place]) 09/16/2021  ? Coronary artery disease involving native coronary artery of native heart without angina pectoris 09/16/2021  ? Ascending aorta dilation ([place]) 09/16/2021  ?  Peripheral edema   ? Acute systolic CHF (congestive heart failure) ([place]) 04/24/2019  ? Weakness generalized 04/22/2019  ? Chronic systolic CHF (congestive heart failure) ([place]) 10/30/2018  ? Acute renal failure superimposed on stage 3 chronic kidney disease ([facility]) 10/30/2018  ? Syncope 10/30/2018  ? Acute on chronic combined systolic and diastolic CHF (congestive heart failure) ([place]) 08/10/2018  ? Mitral regurgitation 08/10/2018  ? Atrial fibrillation ([place]) 06/14/2018  ? PAH (pulmonary artery hypertension) ([place]) 06/13/2018  ? Essential hypertension 06/13/2018  ? Acute on chronic kidney failure ([place]) 07/20/2015  ? HLD (hyperlipidemia) 07/20/2015  ? Stroke (cerebrum) ([place]) 07/20/2015  ? Elevated troponin 07/20/2015  ? Diabetes mellitus without complication ([place]) 83/38/2505  ? Nonsustained paroxysmal ventricular tachycardia ([place]) 12/08/2014  ? RBBB 10/22/2014  ? Bradycardia 10/22/2014  ? PVC's (premature ventricular contractions) 10/22/2014  ? Stage 3a chronic kidney disease ([place]) 10/20/2014  ? Use of leuprolide acetate (Lupron)   ? Hydroureteronephrosis   ? Blood transfusion without reported diagnosis   ? Blurry vision   ? Old lacunar strokes by MRI   ? Hx of Prostate cancer   ? Cancer [facility])   ? Abnormal PSA 01/17/2012  ? ?Past Medical History:  ?Diagnosis Date  ? (HFimpEF) heart failure with improved EF   ? Echo 04/2019: EF 35-40 // Echo 5/22: EF 55-60, no RWMA, GR 1 DD, moderate to severe MR, myxomatous mitral valve, similar to prior echocardiogram, mildly elevated PASP, RVSP 35.9, mild AI, mild AV sclerosis without AS, mild dilation of ascending aorta (43 mm)  ? A-fib ([place])   ? RVR-DCCV  ? Abnormal PSA 01/17/2012  ? 10.38/4/5/ PSA:2013=6.96/ PSA 07/08/2011=6.26/ PSA 2006=8.6  ? Aneurysm ([place])   ? 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 Aortic insufficiency 10/01/2021  ? Echocardiogram March 2023: mild to mod AI  ? Ascending aorta dilation ([place]) 09/16/2021  ? Echocardiogram March 2023: 41 mm  ? Blood transfusion without  reported diagnosis   ? during vagotomy 1976  ? Cancer [facility]) 2006  ? L-2 solitary metastatic lesion/no rad tx ,asymptomatic  ? Chronic kidney disease   ? nephrolithiasis right kidney  ? Coronary artery disease   ? ED (erectile dysfunction)   ? GERD (gastroesophageal reflux disease)   ? PUD S/P PARTIAL VAGOTOMY  ? Gynecomastia 04/21/2011  ? seen by Dr.Murray, no rad tx  ? H/O impacted cerumen   ? S/P ENT DR BATES  ? History of echocardiogram   ? echo 4/16:  mild LVH, EF 55-60%, Gr 1 DD, no RWMA, mild AI, mild MR, mod LAE, mild TR  ? Hx of cardiovascular stress test   ? Myoview 5/16:  EF 50%, no scar or ischemia, low risk  ? Hydroureteronephrosis   ? right   ? Hyperglycemia   ? WITHOUT DM2  ? Hyperlipidemia   ? Hypertension   ?  Insomnia   ? Migraines   ? WITH AURA  ? Mitral regurgitation 08/10/2018  ? Echo 11/04/20: EF 55-60, no RWMA, Gr 1 DD, mod to severe MR, normal RVSF, RVSP 35.9, mild LAE, mild AI, AV sclerosis w/o AS, ascending aorta 43 mm (mild dilation) Echo 3/23: EF 60-65, no RWMA, mild LVH, GR 1 DD, normal RVSF, mildly elevated PASP, moderate LAE, moderate MR, mild to moderate AI, dilated ascending aorta (41 mm), dilated aortic root (40 mm), RVSP 36.4  ? Nephrolithiasis   ? Osteopenia   ? Overweight   ? Prostate cancer ([place]) 12/09/03 dx  ? Prostate ca/adenocarcinoma,gleason=3+3=6    pSA 4.6  ? Prostate cancer ([place])   ? METASTATIC WITH L2 SCLEROTIC LESION ON LUPRON------DR. WOODRUFF  ? RBBB (right bundle branch block)   ? Rosacea   ? Sinus bradycardia   ? Sleep apnea   ? pt unaware, no sleep study  ? Stroke [facility])   ? asymptomatic, found on head imaging  ? Ulcer   ? peptic  ? Use of leuprolide acetate (Lupron) 06/2005-11/2006  ? 02/2008 degarelix 02/2008 x 3 doses  ? ? ? ?Allergies as of 10/29/2021   ? ?   Reactions  ? Aspirin Other (See Comments)  ? History of "stomach ulcers," was told to not take this   ? ?  ? ?  ?Medication List  ?  ? ?STOP taking these medications   ? ?enoxaparin 120 MG/0.8ML injection ?Commonly  known as: LOVENOX ?  ? ?  ? ?TAKE these medications   ? ?aspirin 81 MG EC tablet ?Take 1 tablet (81 mg total) by mouth daily at 6 (six) AM. Swallow whole. ?Start taking on: October 30, 2021 ?  ?CALCIUM 600 + D PO ?

## 2021-10-30 NOTE — Anesthesia Postprocedure Evaluation (Signed)
Anesthesia Post Note ? ?Patient: [name] ? ?Procedure(s) Performed: ABDOMINAL AORTIC ENDOVASCULAR GRAFT WITH RIGHT EPIGASTRIC ARTERY COILING (Right: Abdomen) ? ?  ? ?Patient location during evaluation: PACU ?Anesthesia Type: General ?Level of consciousness: awake and alert ?Pain management: pain level controlled ?Vital Signs Assessment: post-procedure vital signs reviewed and stable ?Respiratory status: spontaneous breathing, nonlabored ventilation, respiratory function stable and patient connected to nasal cannula oxygen ?Cardiovascular status: blood pressure returned to baseline and stable ?Postop Assessment: no apparent nausea or vomiting ?Anesthetic complications: no ? ? ?No notable events documented. ? ?Last Vitals:  ?Vitals:  ? 10/29/21 0726 10/29/21 1218  ?BP: 136/70 105/61  ?Pulse: 67 (!) 54  ?Resp: 17 14  ?Temp: 36.9 ?C 36.4 ?C  ?SpO2: 93% 92%  ?  ?Last Pain:  ?Vitals:  ? 10/29/21 1218  ?TempSrc: Oral  ?PainSc:   ? ? ?  ?  ?  ?  ?  ?  ? ?Suzette Battiest E ? ? ? ? ?

## 2021-11-01 LAB — TYPE AND SCREEN
ABO/RH(D): A NEG
Antibody Screen: POSITIVE
Donor AG Type: NEGATIVE
Donor AG Type: NEGATIVE
Unit division: 0
Unit division: 0
Unit division: 0
Unit division: 0

## 2021-11-01 LAB — BPAM RBC
Blood Product Expiration Date: 202305172359
Blood Product Expiration Date: 202305172359
Blood Product Expiration Date: 202305192359
Blood Product Expiration Date: 202305192359
Unit Type and Rh: 600
Unit Type and Rh: 600
Unit Type and Rh: 600
Unit Type and Rh: 600

## 2021-11-06 ENCOUNTER — Other Ambulatory Visit: Payer: Self-pay

## 2021-11-06 DIAGNOSIS — I723 Aneurysm of iliac artery: Secondary | ICD-10-CM

## 2021-11-16 ENCOUNTER — Other Ambulatory Visit: Payer: Self-pay | Admitting: Physician Assistant

## 2021-11-16 ENCOUNTER — Other Ambulatory Visit: Payer: Self-pay | Admitting: Cardiovascular Disease

## 2021-11-26 ENCOUNTER — Ambulatory Visit
Admission: RE | Admit: 2021-11-26 | Discharge: 2021-11-26 | Disposition: A | Discharge status: Home / Self Care | Payer: Medicare Other | Source: Ambulatory Visit | Attending: Vascular Surgery | Admitting: Vascular Surgery

## 2021-11-26 DIAGNOSIS — I723 Aneurysm of iliac artery: Secondary | ICD-10-CM

## 2021-11-26 MED ORDER — IOPAMIDOL (ISOVUE-370) INJECTION 76%
75.0000 mL | Freq: Once | INTRAVENOUS | Status: AC | PRN
  Administered 2021-11-26: 75 mL via INTRAVENOUS

## 2021-11-30 NOTE — Progress Notes (Unsigned)
[facility] [place]  ASSESSMENT / PLAN: [name] is a 87 y.o. male with a 66m right common iliac artery aneurysm.   Complete cessation from all tobacco products. Excellent blood glucose control with goal A1c < 7%. Blood pressure control with goal blood pressure < 140/90 mmHg. Excellent lipid reduction therapy with goal LDL-C <100 mg/dL. Aspirin '81mg'$  PO QD.  Atorvastatin 40-'80mg'$  PO QD (or other "high intensity" statin therapy).  Good technical result from EVAR. Possible type II endoleak. Mild interval regression of sac to 336m Plan follow up with me in 6 months with EVAR duplex.   CHIEF COMPLAINT: Right common iliac artery aneurysm demonstrated incidentally  HISTORY OF PRESENT ILLNESS: [name] a [age].o. male who presents to clinic for discussion of right common iliac artery aneurysm.  The patient is unaware that he had this finding on his recent PET/CT scan, done for surveillance of prostate cancer.  I reviewed the images with the patient.  We had a lengthy discussion about the natural history of aneurysm disease, and the rationale for treatment.  He is thankfully asymptomatic from an aneurysm standpoint.  11/30/21: returns to clinic after EVAR. He did very well postoperatively. No complaints.  Past Medical History:  Diagnosis Date   (HFimpEF) heart failure with improved EF    Echo 04/2019: EF 35-40 // Echo 5/22: EF 55-60, no RWMA, GR 1 DD, moderate to severe MR, myxomatous mitral valve, similar to prior echocardiogram, mildly elevated PASP, RVSP 35.9, mild AI, mild AV sclerosis without AS, mild dilation of ascending aorta (43 mm)   A-fib ([facility])    RVR-DCCV   Abnormal PSA 01/17/2012   10.38/4/5/ PSA:2013=6.96/ PSA 07/08/2011=6.26/ PSA 2006=8.6   Aneurysm ([facility])    MILD 2.7 CM FUSIFORM llIAC ANEURYSM   Aortic insufficiency 10/01/2021   Echocardiogram March 2023: mild to mod AI   Ascending aorta dilation ([facility]03/15/2023   Echocardiogram March 2023: 41  mm   Blood transfusion without reported diagnosis    during vagotomy 1976   Cancer ([facility]2006   L-2 solitary metastatic lesion/no rad tx ,asymptomatic   Chronic kidney disease    nephrolithiasis right kidney   Coronary artery disease    ED (erectile dysfunction)    GERD (gastroesophageal reflux disease)    PUD S/P PARTIAL VAGOTOMY   Gynecomastia 04/21/2011   seen by Dr.Murray, no rad tx   H/O impacted cerumen    S/P ENT DR BATES   History of echocardiogram    echo 4/16:  mild LVH, EF 55-60%, Gr 1 DD, no RWMA, mild AI, mild MR, mod LAE, mild TR   Hx of cardiovascular stress test    Myoview 5/16:  EF 50%, no scar or ischemia, low risk   Hydroureteronephrosis    right    Hyperglycemia    WITHOUT DM2   Hyperlipidemia    Hypertension    Insomnia    Migraines    WITH AURA   Mitral regurgitation 08/10/2018   Echo 11/04/20: EF 55-60, no RWMA, Gr 1 DD, mod to severe MR, normal RVSF, RVSP 35.9, mild LAE, mild AI, AV sclerosis w/o AS, ascending aorta 43 mm (mild dilation) Echo 3/23: EF 60-65, no RWMA, mild LVH, GR 1 DD, normal RVSF, mildly elevated PASP, moderate LAE, moderate MR, mild to moderate AI, dilated ascending aorta (41 mm), dilated aortic root (40 mm), RVSP 36.4   Nephrolithiasis    Osteopenia    Overweight    Prostate cancer ([facility]6/6/05 dx  Prostate ca/adenocarcinoma,gleason=3+3=6    pSA 4.6   Prostate cancer ([facility])    METASTATIC WITH L2 SCLEROTIC LESION ON LUPRON------DR. WOODRUFF   RBBB (right bundle branch block)    Rosacea    Sinus bradycardia    Sleep apnea    pt unaware, no sleep study   Stroke [facility])    asymptomatic, found on head imaging   Ulcer    peptic   Use of leuprolide acetate (Lupron) 06/2005-11/2006   02/2008 degarelix 02/2008 x 3 doses    Past Surgical History:  Procedure Laterality Date   ABDOMINAL AORTIC ENDOVASCULAR STENT GRAFT Right 10/28/2021   Procedure: ABDOMINAL AORTIC ENDOVASCULAR GRAFT WITH RIGHT EPIGASTRIC ARTERY COILING;  Surgeon: Cherre Robins, MD;  Location: [place];  Service: Vascular;  Laterality: Right;   CARDIOVERSION N/A 06/19/2018   Procedure: CARDIOVERSION;  Surgeon: Larey Dresser, MD;  Location: [place];  Service: Cardiovascular;  Laterality: N/A;   PROSTATE BIOPSY  12/09/03   adenocarcinoma,glerason: 3+3=6   RIGHT/LEFT HEART CATH AND CORONARY ANGIOGRAPHY N/A 04/25/2019   Procedure: RIGHT/LEFT HEART CATH AND CORONARY ANGIOGRAPHY;  Surgeon: Leonie Man, MD;  Location: [place] [facility];  Service: Cardiovascular;  Laterality: N/A;   TEE WITHOUT CARDIOVERSION N/A 06/19/2018   Procedure: TRANSESOPHAGEAL ECHOCARDIOGRAM (TEE);  Surgeon: Larey Dresser, MD;  Location: [facility] ENDOSCOPY;  Service: Cardiovascular;  Laterality: N/A;   VAGOTOMY  1978   partial    Family History  Problem Relation Age of Onset   Liver cancer Father    Kidney Stones Father    Colon cancer Neg Hx    Stroke Neg Hx     Social History   Socioeconomic History   Marital status: Widowed    Spouse name: Not on file   Number of children: 2   Years of education: Not on file   Highest education level: Not on file  Occupational History   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  Widower.   Retired Animator.   Social Determinants of Health   Financial Resource Strain: Not on file  Food Insecurity: Not on file  Transportation Needs: Not on file  Physical Activity: Not on file  Stress: Not on file  Social Connections: Not on file  Intimate Partner Violence: Not on file    Allergies  Allergen Reactions   Aspirin Other (See Comments)    History  of "stomach ulcers," was told to not take this     Current Outpatient Medications  Medication Sig Dispense Refill   apixaban (ELIQUIS) 5 MG TABS tablet TAKE 1 TABLET(5 MG) BY MOUTH TWICE DAILY (Patient taking differently: Take 5 mg by mouth 2 (two) times daily.) 180 tablet 1   aspirin EC 81 MG EC tablet Take 1 tablet (81 mg total) by mouth daily at 6 (six) AM. Swallow whole. 30 tablet 11   Calcium Carb-Cholecalciferol (CALCIUM 600 + D PO) Take 1 tablet by mouth every other day.     Carboxymethylcellulose Sodium (LUBRICANT EYE DROPS OP) Place  1 drop into both eyes daily as needed (dry eyes).     ENTRESTO 97-103 MG TAKE 1 TABLET BY MOUTH TWICE DAILY 180 tablet 1   furosemide (LASIX) 20 MG tablet Take 1 tablet (20 mg total) by mouth daily. Please keep upcoming appointment for future refills. Thank you. 90 tablet 0   metoprolol succinate (TOPROL-XL) 50 MG 24 hr tablet Take 1 tablet (50 mg total) by mouth daily. Take with or immediately following a meal. 90 tablet 3   rosuvastatin (CRESTOR) 20 MG tablet Take 20 mg by mouth daily.     traMADol (ULTRAM) 50 MG tablet Take 1 tablet (50 mg total) by mouth every 6 (six) hours as needed for moderate pain. 16 tablet 0   No current facility-administered medications for this visit.    PHYSICAL EXAM Vitals:   12/01/21 1324  BP: (!) 145/89  Pulse: 86  Resp: 20  Temp: 98.3 F (36.8 C)  SpO2: 94%  Weight: 249 lb (112.9 kg)  Height: '5\' 11"'$  (1.803 m)     Constitutional: well appearing. no distress. Appears well nourished.  Neurologic: CN intact. no focal findings. no sensory loss. Psychiatric:  Mood and affect symmetric and appropriate. Eyes:  No icterus. No conjunctival pallor. Ears, nose, throat:  mucous membranes moist. Midline trachea.  Cardiac: regular rate and rhythm. 2+ DP. Respiratory:  unlabored. Abdominal:  soft, non-tender, non-distended.  Extremity: no edema. no cyanosis. no pallor.  Skin: no gangrene. no ulceration.  Lymphatic: no  Stemmer's sign. no palpable lymphadenopathy.  PERTINENT LABORATORY AND RADIOLOGIC DATA  Most recent CBC    Latest Ref Rng & Units 10/29/2021    5:27 AM 10/28/2021   11:30 AM 10/20/2021    3:14 PM  CBC  WBC 4.0 - 10.5 K/uL 8.2   6.8   6.2    Hemoglobin 13.0 - 17.0 g/dL 12.5   13.1   15.1    Hematocrit 39.0 - 52.0 % 37.2   39.5   46.8    Platelets 150 - 400 K/uL 155   149   193       Most recent CMP    Latest Ref Rng & Units 10/29/2021    5:27 AM 10/28/2021   11:30 AM 10/20/2021    3:14 PM  CMP  Glucose 70 - 99 mg/dL 152   191   174    BUN 8 - 23 mg/dL '14   16   17    '$ Creatinine 0.61 - 1.24 mg/dL 1.05   0.98   1.03    Sodium 135 - 145 mmol/L 139   139   141    Potassium 3.5 - 5.1 mmol/L 4.0   4.2   4.1    Chloride 98 - 111 mmol/L 105   107   106    CO2 22 - 32 mmol/L '26   23   26    '$ Calcium 8.9 - 10.3 mg/dL 8.8   7.3   9.3    Total Protein 6.5 - 8.1 g/dL   7.4    Total Bilirubin 0.3 - 1.2 mg/dL   0.9    Alkaline Phos 38 - 126 U/L   68    AST 15 - 41 U/L   23    ALT 0 - 44 U/L   21      Renal function CrCl cannot be calculated (Patient's most recent lab result is older than the maximum 21 days allowed.).  Hgb A1c MFr Bld (%)  Date  Value  04/22/2019 6.3 (H)    LDL Cholesterol  Date Value Ref Range Status  04/22/2019 71 0 - 99 mg/dL Final    Comment:           Total Cholesterol/HDL:CHD Risk Coronary Heart Disease Risk Table                     Men   Women  1/2 Average Risk   3.4   3.3  Average Risk       5.0   4.4  2 X Average Risk   9.6   7.1  3 X Average Risk  23.4   11.0        Use the calculated Patient Ratio above and the CHD Risk Table to determine the patient's CHD Risk.        ATP III CLASSIFICATION (LDL):  <100     mg/dL   Optimal  100-129  mg/dL   Near or Above                    Optimal  130-159  mg/dL   Borderline  160-189  mg/dL   High  >190     mg/dL   Very High Performed at [facility] [address], [place], [place] [address]      CT angiogram 11/26/21 Personally reviewed. Good technical result from EVAR. Possible type II endoleak. Mild interval regression of sac to 106m.  TYevonne Aline HStanford Breed MD [facility]morePhone Number: ([phone]/30/2023 1:33 PM  Total time spent on preparing this encounter including chart review, data review, collecting history, examining the patient, coordinating care for this patient, 10 minutes.  Portions of this report may have been transcribed using voice recognition software.  Every effort has been made to ensure accuracy; however, inadvertent computerized transcription errors may still be present.

## 2021-12-01 ENCOUNTER — Ambulatory Visit (INDEPENDENT_AMBULATORY_CARE_PROVIDER_SITE_OTHER): Payer: Medicare Other | Admitting: Vascular Surgery

## 2021-12-01 ENCOUNTER — Encounter: Payer: Self-pay | Admitting: Vascular Surgery

## 2021-12-01 VITALS — BP 145/89 | HR 86 | Temp 98.3°F | Resp 20 | Ht 71.0 in | Wt 249.0 lb

## 2021-12-01 DIAGNOSIS — Z9889 Other specified postprocedural states: Secondary | ICD-10-CM

## 2021-12-01 DIAGNOSIS — Z8679 Personal history of other diseases of the circulatory system: Secondary | ICD-10-CM

## 2021-12-04 ENCOUNTER — Other Ambulatory Visit: Payer: Self-pay | Admitting: *Deleted

## 2021-12-04 DIAGNOSIS — Z8679 Personal history of other diseases of the circulatory system: Secondary | ICD-10-CM

## 2022-03-23 ENCOUNTER — Encounter: Payer: Self-pay | Admitting: Cardiovascular Disease

## 2022-03-23 ENCOUNTER — Ambulatory Visit: Payer: Medicare Other | Attending: Cardiovascular Disease | Admitting: Cardiovascular Disease

## 2022-03-23 VITALS — BP 146/84 | HR 77 | Ht 72.0 in | Wt 243.2 lb

## 2022-03-23 DIAGNOSIS — E782 Mixed hyperlipidemia: Secondary | ICD-10-CM

## 2022-03-23 DIAGNOSIS — I5020 Unspecified systolic (congestive) heart failure: Secondary | ICD-10-CM

## 2022-03-23 DIAGNOSIS — I2510 Atherosclerotic heart disease of native coronary artery without angina pectoris: Secondary | ICD-10-CM | POA: Diagnosis not present

## 2022-03-23 DIAGNOSIS — I480 Paroxysmal atrial fibrillation: Secondary | ICD-10-CM | POA: Diagnosis not present

## 2022-03-23 DIAGNOSIS — I10 Essential (primary) hypertension: Secondary | ICD-10-CM

## 2022-03-23 DIAGNOSIS — I77810 Thoracic aortic ectasia: Secondary | ICD-10-CM

## 2022-03-23 DIAGNOSIS — I340 Nonrheumatic mitral (valve) insufficiency: Secondary | ICD-10-CM

## 2022-03-23 NOTE — Progress Notes (Signed)
Cardiology Office Note:    Date:  03/23/2022   ID:  [name], DOB 24-Jan-1935, MRN [id number]  PCP:  [name], MD   [place] Cardiologist:  Sherren Mocha, MD Cardiology APP:  Liliane Shi, PA-C  Electrophysiologist:  Thompson Grayer, MD     Referring MD: [name], MD   Chief Complaint  Patient presents with   Coronary Artery Disease    History of Present Illness:    [name] is a 87 y.o. male with a hx of: Hx of syncope Vasovagal (Admx 10/2018) NSVT  (HFrEF) heart failure with reduced ejection fraction (imp to normal) Hx of tachy mediated CM in 12/19 (AF w RVR) >> EF returned to normal in NSR Non-ischemic cardiomyopathy  Echocardiogram 04/2019: EF 35-40 Cath w mod non-obs CAD Echocardiogram 5/22: EF 55-60  Coronary artery disease  Cath 04/2019: mod non-obstructive CAD  Persistent atrial fibrillation  CHA2DS2-VASc Score = 8 [CHF History: 1, HTN History: 1, Diabetes History: 1, Stroke History: 2, Vascular Disease History: 1, Age Score: 2, Gender Score: 0].    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 Burt Knack - med Rx if remains asymptomatic) Pulmonary HTN  Diabetes mellitus  Hypertension  Hyperlipidemia  Chronic kidney disease  Prior CVA  OSA  Prostate CA  Dementia  RBBB   Past Medical History:  Diagnosis Date   (HFimpEF) heart failure with improved EF    Echo 04/2019: EF 35-40 // Echo 5/22: EF 55-60, no RWMA, GR 1 DD, moderate to severe MR, myxomatous mitral valve, similar to prior echocardiogram, mildly elevated PASP, RVSP 35.9, mild AI, mild AV sclerosis without AS, mild dilation of ascending aorta (43 mm)   A-fib (HCC)    RVR-DCCV   Abnormal PSA 01/17/2012   10.38/4/5/ PSA:2013=6.96/ PSA 07/08/2011=6.26/ PSA 2006=8.6    Aneurysm (HCC)    MILD 2.7 CM FUSIFORM llIAC ANEURYSM   Aortic insufficiency 10/01/2021   Echocardiogram March 2023: mild to mod AI   Ascending aorta dilation ([place]) 09/16/2021   Echocardiogram March 2023: 41 mm   Blood transfusion without reported diagnosis    during vagotomy 1976   Cancer ([place]) 2006   L-2 solitary metastatic lesion/no rad tx ,asymptomatic   Chronic kidney disease    nephrolithiasis right kidney   Coronary artery disease    ED (erectile dysfunction)    GERD (gastroesophageal reflux disease)    PUD S/P PARTIAL VAGOTOMY   Gynecomastia 04/21/2011   seen by Dr.Murray, no rad tx   H/O impacted cerumen    S/P ENT DR BATES   History of echocardiogram    echo 4/16:  mild LVH, EF 55-60%, Gr 1 DD, no RWMA, mild AI, mild MR, mod LAE, mild TR   Hx of cardiovascular stress test    Myoview 5/16:  EF 50%, no scar or ischemia, low risk   Hydroureteronephrosis    right    Hyperglycemia    WITHOUT DM2   Hyperlipidemia    Hypertension    Insomnia    Migraines    WITH AURA   Mitral regurgitation 08/10/2018   Echo 11/04/20: EF 55-60, no RWMA, Gr 1 DD, mod to severe MR, normal RVSF, RVSP 35.9, mild LAE, mild AI, AV sclerosis w/o AS, ascending aorta 43 mm (mild dilation) Echo 3/23:  EF 60-65, no RWMA, mild LVH, GR 1 DD, normal RVSF, mildly elevated PASP, moderate LAE, moderate MR, mild to moderate AI, dilated ascending aorta (41 mm), dilated aortic root (40 mm), RVSP 36.4   Nephrolithiasis    Osteopenia    Overweight    Prostate cancer ([place]) 12/09/03 dx   Prostate ca/adenocarcinoma,gleason=3+3=6    pSA 4.6   Prostate cancer (HCC)    METASTATIC WITH L2 SCLEROTIC LESION ON LUPRON------DR. WOODRUFF   RBBB (right bundle branch block)    Rosacea    Sinus bradycardia    Sleep apnea    pt unaware, no sleep study   Stroke [facility])    asymptomatic, found on head imaging   Ulcer    peptic   Use of leuprolide acetate (Lupron) 06/2005-11/2006   02/2008 degarelix 02/2008 x 3 doses    Past  Surgical History:  Procedure Laterality Date   ABDOMINAL AORTIC ENDOVASCULAR STENT GRAFT Right 10/28/2021   Procedure: ABDOMINAL AORTIC ENDOVASCULAR GRAFT WITH RIGHT EPIGASTRIC ARTERY COILING;  Surgeon: Cherre Robins, MD;  Location: [facility];  Service: Vascular;  Laterality: Right;   CARDIOVERSION N/A 06/19/2018   Procedure: CARDIOVERSION;  Surgeon: Larey Dresser, MD;  Location: [place];  Service: Cardiovascular;  Laterality: N/A;   PROSTATE BIOPSY  12/09/03   adenocarcinoma,glerason: 3+3=6   RIGHT/LEFT HEART CATH AND CORONARY ANGIOGRAPHY N/A 04/25/2019   Procedure: RIGHT/LEFT HEART CATH AND CORONARY ANGIOGRAPHY;  Surgeon: Leonie Man, MD;  Location: [facility] CV [facility];  Service: Cardiovascular;  Laterality: N/A;   TEE WITHOUT CARDIOVERSION N/A 06/19/2018   Procedure: TRANSESOPHAGEAL ECHOCARDIOGRAM (TEE);  Surgeon: Larey Dresser, MD;  [facility] ENDOSCOPY;  Service: Cardiovascular;  Laterality: N/A;   VAGOTOMY  1978   partial    Current Medications: Current Meds  Medication Sig   apixaban (ELIQUIS) 5 MG TABS tablet TAKE 1 TABLET(5 MG) BY MOUTH TWICE DAILY (Patient taking differently: Take 5 mg by mouth 2 (two) times daily.)   Carboxymethylcellulose Sodium (LUBRICANT EYE DROPS OP) Place 1 drop into both eyes daily as needed (dry eyes).   ENTRESTO 97-103 MG TAKE 1 TABLET BY MOUTH TWICE DAILY   furosemide (LASIX) 20 MG tablet Take 1 tablet (20 mg total) by mouth daily. Please keep upcoming appointment for future refills. Thank you.     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  Widower.   Retired Animator.   Social Determinants of Health   Financial Resource Strain: Not on file  Food Insecurity: Not on file  Transportation Needs: Not on file  Physical Activity: Not on file  Stress: Not on file  Social Connections: Not on file     Family History: The patient's family history  includes Kidney Stones in his father; Liver cancer in his father. There is no history of Colon cancer or Stroke.  ROS:   Please see the history of present illness.    Positive for fatigue and insomnia.  All other systems reviewed and are negative.  EKGs/Labs/Other Studies Reviewed:    The following studies were reviewed today: Cardiac catheterization 04/25/2019: SUMMARY Diffuse mild to moderate CAD: Most notable 60% proximal OM1, 50 to 60% mid LAD at D2.  No obvious culprit lesion to explain significantly reduced EF.  Mild proximal LM and ostial LCx-calcified lesions. Mild pulmonary hypertension with mean PA pressure of 28 mmHg.  LVEDP and PCWP from 12 to 15 mmHg. Borderline CO-CI   RECOMMENDATIONS Return to nursing unit for ongoing care, TR band removal. Would continue aggressive risk factor modification and heart failure medications.   Appears to be at relatively well diuresed, would titrate up afterload reduction If he has any concerning symptoms for possible angina, would consider noninvasive evaluation with Myoview, or perhaps coronary CTA to determine presence of either anterior or lateral ischemia suggesting LAD or OM lesions are more significant than they appear angiographically.  Myocardial Perfusion Scan 10/01/2021:   Findings are consistent with prior myocardial infarction and no peri-infarct ischemia. The study is intermediate risk due to  depresesd ejection fraction.   No ST deviation was noted.   Left ventricular function is abnormal. Global function is moderately reduced. Nuclear stress EF: 41 %. The left ventricular ejection fraction is moderately decreased (30-44%). End diastolic cavity size is moderately enlarged.   Prior study not available for comparison.  Echo 09/30/2021: 1. Left ventricular ejection fraction, by estimation, is 60 to 65%. The  left ventricle has normal function. The left ventricle has no regional  wall motion abnormalities. There is mild concentric left ventricular  hypertrophy. Left ventricular diastolic  parameters are consistent with Grade I diastolic dysfunction (impaired  relaxation).   2. Right ventricular systolic function is normal. The right ventricular  size is normal. There is mildly elevated pulmonary artery systolic  pressure.   3. Left atrial size was moderately dilated.   4. The mitral valve is myxomatous. Eccentric anteriorly directed moderate  mitral valve regurgitation. No evidence of mitral stenosis.   5. The aortic valve is tricuspid. There is mild thickening of the aortic  valve. Aortic valve regurgitation is mild to moderate. No aortic stenosis  is present.   6. There is dilatation of the ascending aorta, measuring 41 mm. There is  dilatation of the aortic root, measuring 40 mm.   7. The inferior vena cava is normal in size with greater than 50%  respiratory variability, suggesting right atrial pressure of 3 mmHg.   Comparison(s): A prior study was performed on 11/04/2020. No significant  change from prior study.   Recent Labs: 10/20/2021: ALT 21 10/28/2021: Magnesium 1.6 10/29/2021: BUN 14; Creatinine, Ser 1.05; Hemoglobin 12.5; Platelets 155; Potassium 4.0; Sodium 139  Recent Lipid Panel    Component Value Date/Time   CHOL 129 04/22/2019 1759   TRIG 118 04/22/2019 1759   HDL 34 (L) 04/22/2019 1759   CHOLHDL 3.8 04/22/2019 1759   VLDL 24 04/22/2019 1759   LDLCALC 71  04/22/2019 1759     Risk Assessment/Calculations:    CHA2DS2-VASc Score = 8   This indicates a 10.8% annual risk of stroke. The patient's score is based upon: CHF History: 1 HTN History: 1 Diabetes History: 1 Stroke History: 2 Vascular Disease History: 1 Age Score: 2 Gender  Score: 0     HYPERTENSION CONTROL Vitals:   03/23/22 1515 03/23/22 1539  BP: (!) 130/90 (!) 146/84    The patient's blood pressure is elevated above target today.  In order to address the patient's elevated BP: Blood pressure will be monitored at home to determine if medication changes need to be made.            Physical Exam:    VS:  BP (!) 146/84   Pulse 77   Ht 6' (1.829 m)   Wt 243 lb 3.2 oz (110.3 kg)   SpO2 92%   BMI 32.98 kg/m     Wt Readings from Last 3 Encounters:  03/23/22 243 lb 3.2 oz (110.3 kg)  12/01/21 249 lb (112.9 kg)  10/28/21 240 lb (108.9 kg)     GEN:  Well nourished, well developed pleasant elderly male in no acute distress HEENT: Normal NECK: No JVD; No carotid bruits LYMPHATICS: No lymphadenopathy CARDIAC: RRR, no murmurs, rubs, gallops RESPIRATORY:  Clear to auscultation without rales, wheezing or rhonchi  ABDOMEN: Soft, non-tender, non-distended MUSCULOSKELETAL:  No edema; No deformity  SKIN: Warm and dry NEUROLOGIC:  Alert and oriented x 3 PSYCHIATRIC:  Normal affect   ASSESSMENT:    1. Coronary artery disease involving native coronary artery of native heart without angina pectoris   2. Paroxysmal atrial fibrillation (HCC)   3. HFrEF (heart failure with reduced ejection fraction) ([place])   4. Nonrheumatic mitral valve regurgitation   5. Ascending aorta dilation (HCC)   6. Mixed hyperlipidemia   7. Essential hypertension    PLAN:    In order of problems listed above:  Most recent cardiac catheterization from 2020 reviewed and the patient has moderate nonobstructive CAD with recommendations for continued medical management. Tolerating oral  anticoagulation with apixaban.  Occasionally sees blood when he wipes but no major GI bleeding.  Check CBC and metabolic panel today. Appears stable on Entresto.  He has discontinued metoprolol succinate.  The patient has had a lot of fatigue and would like to avoid any potentially exacerbating medications.  His last echo showed an LVEF of 60 to 65%. Moderate mitral regurgitation on most recent echo.  No appreciable murmur on exam today.  Continue current therapy. Measures 40 to 41 mm on most recent echo.  Continue treatment of hypertension. Has been treated with rosuvastatin.  He has discontinued this on his own. Treated with Entresto.  Blood pressure mildly elevated on my evaluation today.  Continue observation.  Patient not inclined to take more medication.           Medication Adjustments/Labs and Tests Ordered: Current medicines are reviewed at length with the patient today.  Concerns regarding medicines are outlined above.  Orders Placed This Encounter  Procedures   CBC   Basic metabolic panel   No orders of the defined types were placed in this encounter.   Patient Instructions  Medication Instructions:  Your physician recommends that you continue on your current medications as directed. Please refer to the Current Medication list given to you today.  *If you need a refill on your cardiac medications before your next appointment, please call your pharmacy*   Lab Work: CBC, BMET Today If you have labs (blood work) drawn today and your tests are completely normal, you will receive your results only by: [place] (if you have MyChart) OR A paper copy in the mail If you have any lab test that is abnormal or we need to change  your treatment, we will call you to review the results.   Testing/Procedures: NONE   Follow-Up: At [facility], you and your health needs are our priority.  As part of our continuing mission to provide you with exceptional heart care,  we have created designated Provider Care Teams.  These Care Teams include your primary Cardiologist (physician) and Advanced Practice Providers (APPs -  Physician Assistants and Nurse Practitioners) who all work together to provide you with the care you need, when you need it.  We recommend signing up for the patient portal called "MyChart".  Sign up information is provided on this After Visit Summary.  MyChart is used to connect with patients for Virtual Visits (Telemedicine).  Patients are able to view lab/test results, encounter notes, upcoming appointments, etc.  Non-urgent messages can be sent to your provider as well.   To learn more about what you can do with MyChart, go to [email or web address].    Your next appointment:   6 month(s)  The format for your next appointment:   In Person  Provider:   Nicholes Rough, PA-C, Ambrose Pancoast, NP, Christen Bame, NP, or [name], PA-C     Then, Sherren Mocha, MD will plan to see you again in 1 year(s).       Important Information About Sugar         Signed, Sherren Mocha, MD  03/23/2022 5:19 PM    [place]

## 2022-03-23 NOTE — Patient Instructions (Signed)
Medication Instructions:  Your physician recommends that you continue on your current medications as directed. Please refer to the Current Medication list given to you today.  *If you need a refill on your cardiac medications before your next appointment, please call your pharmacy*   Lab Work: CBC, BMET Today If you have labs (blood work) drawn today and your tests are completely normal, you will receive your results only by: [place] (if you have MyChart) OR A paper copy in the mail If you have any lab test that is abnormal or we need to change your treatment, we will call you to review the results.   Testing/Procedures: NONE   Follow-Up: At [facility], you and your health needs are our priority.  As part of our continuing mission to provide you with exceptional heart care, we have created designated Provider Care Teams.  These Care Teams include your primary Cardiologist (physician) and Advanced Practice Providers (APPs -  Physician Assistants and Nurse Practitioners) who all work together to provide you with the care you need, when you need it.  We recommend signing up for the patient portal called "MyChart".  Sign up information is provided on this After Visit Summary.  MyChart is used to connect with patients for Virtual Visits (Telemedicine).  Patients are able to view lab/test results, encounter notes, upcoming appointments, etc.  Non-urgent messages can be sent to your provider as well.   To learn more about what you can do with MyChart, go to [email or web address].    Your next appointment:   6 month(s)  The format for your next appointment:   In Person  Provider:   Nicholes Rough, PA-C, Ambrose Pancoast, NP, Christen Bame, NP, or [name], PA-C     Then, Sherren Mocha, MD will plan to see you again in 1 year(s).       Important Information About Sugar

## 2022-03-24 LAB — CBC
Hematocrit: 44 % (ref 37.5–51.0)
Hemoglobin: 14.5 g/dL (ref 13.0–17.7)
MCH: 29.5 pg (ref 26.6–33.0)
MCHC: 33 g/dL (ref 31.5–35.7)
MCV: 89 fL (ref 79–97)
Platelets: 204 10*3/uL (ref 150–450)
RBC: 4.92 x10E6/uL (ref 4.14–5.80)
RDW: 13.5 % (ref 11.6–15.4)
WBC: 6.3 10*3/uL (ref 3.4–10.8)

## 2022-03-24 LAB — BASIC METABOLIC PANEL
BUN/Creatinine Ratio: 20 (ref 10–24)
BUN: 19 mg/dL (ref 8–27)
CO2: 22 mmol/L (ref 20–29)
Calcium: 9.5 mg/dL (ref 8.6–10.2)
Chloride: 103 mmol/L (ref 96–106)
Creatinine, Ser: 0.93 mg/dL (ref 0.76–1.27)
Glucose: 158 mg/dL — ABNORMAL HIGH (ref 70–99)
Potassium: 4.5 mmol/L (ref 3.5–5.2)
Sodium: 143 mmol/L (ref 134–144)
eGFR: 81 mL/min/{1.73_m2} (ref >59)

## 2022-04-27 ENCOUNTER — Other Ambulatory Visit: Payer: Self-pay | Admitting: Physician Assistant

## 2022-05-03 ENCOUNTER — Other Ambulatory Visit: Payer: Self-pay | Admitting: Cardiovascular Disease

## 2022-05-03 DIAGNOSIS — I480 Paroxysmal atrial fibrillation: Secondary | ICD-10-CM

## 2022-05-03 NOTE — Telephone Encounter (Signed)
Eliquis '5mg'$  refill request received. Patient is 87 years old, weight-110.3kg, Crea-0.93 on 03/23/2022, Diagnosis-Afib, and last seen by Dr. Burt Knack on 03/23/2022. Dose is appropriate based on dosing criteria. Will send in refill to requested pharmacy.

## 2022-07-19 ENCOUNTER — Other Ambulatory Visit: Payer: Self-pay | Admitting: Physician Assistant

## 2022-08-24 ENCOUNTER — Telehealth: Payer: Self-pay

## 2022-08-24 NOTE — Telephone Encounter (Signed)
Pt called asking what his appt on 3/12 was for since he wasn't told.  Reviewed pt's chart, returned call for clarification, two identifiers used. Informed pt of appt, it's purpose, and the reason for the fasting instructions. Confirmed understanding.

## 2022-09-14 ENCOUNTER — Ambulatory Visit (HOSPITAL_COMMUNITY)
Admission: RE | Admit: 2022-09-14 | Discharge: 2022-09-14 | Disposition: A | Discharge status: Home / Self Care | Payer: Medicare Other | Source: Ambulatory Visit | Attending: Vascular Surgery | Admitting: Vascular Surgery

## 2022-09-14 ENCOUNTER — Ambulatory Visit: Payer: Medicare Other | Admitting: Vascular Surgery

## 2022-09-14 ENCOUNTER — Encounter: Payer: Self-pay | Admitting: Vascular Surgery

## 2022-09-14 VITALS — BP 162/101 | HR 80 | Temp 97.9°F | Resp 20 | Ht 72.0 in | Wt 243.0 lb

## 2022-09-14 DIAGNOSIS — Z8679 Personal history of other diseases of the circulatory system: Secondary | ICD-10-CM | POA: Insufficient documentation

## 2022-09-14 DIAGNOSIS — Z9889 Other specified postprocedural states: Secondary | ICD-10-CM | POA: Insufficient documentation

## 2022-09-14 NOTE — Progress Notes (Signed)
[facility] [place]  ASSESSMENT / PLAN: [name] is a 88 y.o. male with a 79m right common iliac artery aneurysm.   Complete cessation from all tobacco products. Excellent blood glucose control with goal A1c < 7%. Blood pressure control with goal blood pressure < 140/90 mmHg. Excellent lipid reduction therapy with goal LDL-C <100 mg/dL. Aspirin '81mg'$  PO QD.  Atorvastatin 40-'80mg'$  PO QD (or other "high intensity" statin therapy).  Duplex today suggests sac enlargement to 438m but no evidence of endoleak. Will check CT angiogram. I will review the results with him by telephone.   CHIEF COMPLAINT: Right common iliac artery aneurysm demonstrated incidentally  HISTORY OF PRESENT ILLNESS: [name] a [age].o. male who presents to clinic for discussion of right common iliac artery aneurysm.  The patient is unaware that he had this finding on his recent PET/CT scan, done for surveillance of prostate cancer.  I reviewed the images with the patient.  We had a lengthy discussion about the natural history of aneurysm disease, and the rationale for treatment.  He is thankfully asymptomatic from an aneurysm standpoint.  11/30/21: returns to clinic after EVAR. He did very well postoperatively. No complaints.  09/14/22: doing ok. Feels "weak as a kitten." Has not been moving much. Sounds deconditioned.   Past Medical History:  Diagnosis Date   (HFimpEF) heart failure with improved EF    Echo 04/2019: EF 35-40 // Echo 5/22: EF 55-60, no RWMA, GR 1 DD, moderate to severe MR, myxomatous mitral valve, similar to prior echocardiogram, mildly elevated PASP, RVSP 35.9, mild AI, mild AV sclerosis without AS, mild dilation of ascending aorta (43 mm)   A-fib (HCC)    RVR-DCCV   Abnormal PSA 01/17/2012   10.38/4/5/ PSA:2013=6.96/ PSA 07/08/2011=6.26/ PSA 2006=8.6   Aneurysm (HCC)    MILD 2.7 CM FUSIFORM llIAC ANEURYSM   Aortic insufficiency 10/01/2021   Echocardiogram March 2023:  mild to mod AI   Ascending aorta dilation ([facility]/15/2023   Echocardiogram March 2023: 41 mm   Blood transfusion without reported diagnosis    during vagotomy 1976   Cancer ([facility]   L-2 solitary metastatic lesion/no rad tx ,asymptomatic   Chronic kidney disease    nephrolithiasis right kidney   Coronary artery disease    ED (erectile dysfunction)    GERD (gastroesophageal reflux disease)    PUD S/P PARTIAL VAGOTOMY   Gynecomastia 04/21/2011   seen by Dr.Murray, no rad tx   H/O impacted cerumen    S/P ENT DR BATES   History of echocardiogram    echo 4/16:  mild LVH, EF 55-60%, Gr 1 DD, no RWMA, mild AI, mild MR, mod LAE, mild TR   Hx of cardiovascular stress test    Myoview 5/16:  EF 50%, no scar or ischemia, low risk   Hydroureteronephrosis    right    Hyperglycemia    WITHOUT DM2   Hyperlipidemia    Hypertension    Insomnia    Migraines    WITH AURA   Mitral regurgitation 08/10/2018   Echo 11/04/20: EF 55-60, no RWMA, Gr 1 DD, mod to severe MR, normal RVSF, RVSP 35.9, mild LAE, mild AI, AV sclerosis w/o AS, ascending aorta 43 mm (mild dilation) Echo 3/23: EF 60-65, no RWMA, mild LVH, GR 1 DD, normal RVSF, mildly elevated PASP, moderate LAE, moderate MR, mild to moderate AI, dilated ascending aorta (41 mm), dilated aortic root (40 mm), RVSP 36.4   Nephrolithiasis  Osteopenia    Overweight    Prostate cancer ([facility]) 12/09/03 dx   Prostate ca/adenocarcinoma,gleason=3+3=6    pSA 4.6   Prostate cancer (HCC)    METASTATIC WITH L2 SCLEROTIC LESION ON LUPRON------DR. WOODRUFF   RBBB (right bundle branch block)    Rosacea    Sinus bradycardia    Sleep apnea    pt unaware, no sleep study   Stroke [facility])    asymptomatic, found on head imaging   Ulcer    peptic   Use of leuprolide acetate (Lupron) 06/2005-11/2006   02/2008 degarelix 02/2008 x 3 doses    Past Surgical History:  Procedure Laterality Date   ABDOMINAL AORTIC ENDOVASCULAR STENT GRAFT Right 10/28/2021   Procedure:  ABDOMINAL AORTIC ENDOVASCULAR GRAFT WITH RIGHT EPIGASTRIC ARTERY COILING;  Surgeon: Cherre Robins, MD;  Location: [facility];  Service: Vascular;  Laterality: Right;   CARDIOVERSION N/A 06/19/2018   Procedure: CARDIOVERSION;  Surgeon: Larey Dresser, MD;  Location: [facility];  Service: Cardiovascular;  Laterality: N/A;   PROSTATE BIOPSY  12/09/03   adenocarcinoma,glerason: 3+3=6   RIGHT/LEFT HEART CATH AND CORONARY ANGIOGRAPHY N/A 04/25/2019   Procedure: RIGHT/LEFT HEART CATH AND CORONARY ANGIOGRAPHY;  Surgeon: Leonie Man, MD;  Location: [facility];  Service: Cardiovascular;  Laterality: N/A;   TEE WITHOUT CARDIOVERSION N/A 06/19/2018   Procedure: TRANSESOPHAGEAL ECHOCARDIOGRAM (TEE);  Surgeon: Larey Dresser, MD;  Location: [facility] ENDOSCOPY;  Service: Cardiovascular;  Laterality: N/A;   VAGOTOMY  1978   partial    Family History  Problem Relation Age of Onset   Liver cancer Father    Kidney Stones Father    Colon cancer Neg Hx    Stroke Neg Hx     Social History   Socioeconomic History   Marital status: Widowed    Spouse name: Not on file   Number of children: 2   Years of education: Not on file   Highest education level: Not on file  Occupational History   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  Widower.   Retired Animator.   Social Determinants of Health   Financial Resource Strain: Not on file  Food Insecurity: Not on file  Transportation Needs: Not on file  Physical Activity: Not on file  Stress: Not on file  Social Connections: Not on file  Intimate Partner Violence:  Not on file    Allergies  Allergen Reactions   Aspirin Other (See Comments)    History of "stomach ulcers," was told to not take this     Current Outpatient Medications  Medication Sig Dispense Refill   ELIQUIS 5 MG TABS tablet TAKE 1 TABLET(5 MG) BY MOUTH TWICE DAILY 180 tablet 1   sacubitril-valsartan (ENTRESTO) 97-103 MG Take 1 tablet by mouth 2 (two) times daily. 180 tablet 3   aspirin EC 81 MG EC tablet Take 1 tablet (81 mg total) by mouth daily at 6 (six) AM. Swallow whole. (Patient not taking: Reported on 03/23/2022) 30 tablet 11  Calcium Carb-Cholecalciferol (CALCIUM 600 + D PO) Take 1 tablet by mouth every other day. (Patient not taking: Reported on 03/23/2022)     Carboxymethylcellulose Sodium (LUBRICANT EYE DROPS OP) Place 1 drop into both eyes daily as needed (dry eyes). (Patient not taking: Reported on 09/14/2022)     furosemide (LASIX) 20 MG tablet TAKE 1 TABLET BY MOUTH EVERY DAY (Patient not taking: Reported on 09/14/2022) 90 tablet 3   metoprolol succinate (TOPROL-XL) 50 MG 24 hr tablet Take 1 tablet (50 mg total) by mouth daily. Take with or immediately following a meal. 90 tablet 3   rosuvastatin (CRESTOR) 20 MG tablet Take 20 mg by mouth daily. (Patient not taking: Reported on 03/23/2022)     traMADol (ULTRAM) 50 MG tablet Take 1 tablet (50 mg total) by mouth every 6 (six) hours as needed for moderate pain. (Patient not taking: Reported on 03/23/2022) 16 tablet 0   No current facility-administered medications for this visit.    PHYSICAL EXAM Vitals:   09/14/22 1246  BP: (!) 162/101  Pulse: 80  Resp: 20  Temp: 97.9 F (36.6 C)  SpO2: 95%  Weight: 243 lb (110.2 kg)  Height: 6' (1.829 m)     Constitutional: well appearing. no distress. Appears well nourished.  Neurologic: CN intact. no focal findings. no sensory loss. Psychiatric:  Mood and affect symmetric and appropriate. Eyes:  No icterus. No conjunctival pallor. Ears, nose, throat:  mucous membranes moist.  Midline trachea.  Cardiac: regular rate and rhythm. 2+ DP. Respiratory:  unlabored. Abdominal:  soft, non-tender, non-distended.  Extremity: no edema. no cyanosis. no pallor.  Skin: no gangrene. no ulceration.  Lymphatic: no Stemmer's sign. no palpable lymphadenopathy.  PERTINENT LABORATORY AND RADIOLOGIC DATA  Most recent CBC    Latest Ref Rng & Units 03/23/2022    3:44 PM 10/29/2021    5:27 AM 10/28/2021   11:30 AM  CBC  WBC 3.4 - 10.8 x10E3/uL 6.3  8.2  6.8   Hemoglobin 13.0 - 17.7 g/dL 14.5  12.5  13.1   Hematocrit 37.5 - 51.0 % 44.0  37.2  39.5   Platelets 150 - 450 x10E3/uL 204  155  149      Most recent CMP    Latest Ref Rng & Units 03/23/2022    3:44 PM 10/29/2021    5:27 AM 10/28/2021   11:30 AM  CMP  Glucose 70 - 99 mg/dL 158  152  191   BUN 8 - 27 mg/dL '19  14  16   '$ Creatinine 0.76 - 1.27 mg/dL 0.93  1.05  0.98   Sodium 134 - 144 mmol/L 143  139  139   Potassium 3.5 - 5.2 mmol/L 4.5  4.0  4.2   Chloride 96 - 106 mmol/L 103  105  107   CO2 20 - 29 mmol/L '22  26  23   '$ Calcium 8.6 - 10.2 mg/dL 9.5  8.8  7.3     Renal function CrCl cannot be calculated (Patient's most recent lab result is older than the maximum 21 days allowed.).  Hgb A1c MFr Bld (%)  Date Value  04/22/2019 6.3 (H)    LDL Cholesterol  Date Value Ref Range Status  04/22/2019 71 0 - 99 mg/dL Final    Comment:           Total Cholesterol/HDL:CHD Risk Coronary Heart Disease Risk Table  Men   Women  1/2 Average Risk   3.4   3.3  Average Risk       5.0   4.4  2 X Average Risk   9.6   7.1  3 X Average Risk  23.4   11.0        Use the calculated Patient Ratio above and the CHD Risk Table to determine the patient's CHD Risk.        ATP III CLASSIFICATION (LDL):  <100     mg/dL   Optimal  100-129  mg/dL   Near or Above                    Optimal  130-159  mg/dL   Borderline  160-189  mg/dL   High  >190     mg/dL   Very High Performed at [facility]  [address], [place], [place] [address]     Abdominal Aorta Duplex: The largest right common iliac artery aneurysm  measurement is 4.6 cm. Patent endovascular aneurysm repair; unable to  detect endoleak. No previous exam available for comparison.   Yevonne Aline. Stanford Breed, MD [facility] Phone Number: [phone] 09/14/2022 12:56 PM  Total time spent on preparing this encounter including chart review, data review, collecting history, examining the patient, coordinating care for this patient, 10 minutes.  Portions of this report may have been transcribed using voice recognition software.  Every effort has been made to ensure accuracy; however, inadvertent computerized transcription errors may still be present.

## 2022-09-20 ENCOUNTER — Ambulatory Visit: Payer: Medicare Other | Attending: Physician Assistant | Admitting: Physician Assistant

## 2022-09-20 ENCOUNTER — Encounter: Payer: Self-pay | Admitting: Physician Assistant

## 2022-09-20 VITALS — BP 158/90 | HR 78 | Ht 72.0 in | Wt 245.0 lb

## 2022-09-20 DIAGNOSIS — I340 Nonrheumatic mitral (valve) insufficiency: Secondary | ICD-10-CM | POA: Diagnosis not present

## 2022-09-20 DIAGNOSIS — I10 Essential (primary) hypertension: Secondary | ICD-10-CM

## 2022-09-20 DIAGNOSIS — E785 Hyperlipidemia, unspecified: Secondary | ICD-10-CM

## 2022-09-20 DIAGNOSIS — E782 Mixed hyperlipidemia: Secondary | ICD-10-CM

## 2022-09-20 DIAGNOSIS — I77810 Thoracic aortic ectasia: Secondary | ICD-10-CM

## 2022-09-20 DIAGNOSIS — I5020 Unspecified systolic (congestive) heart failure: Secondary | ICD-10-CM | POA: Diagnosis not present

## 2022-09-20 MED ORDER — EZETIMIBE 10 MG PO TABS: 10.0000 mg | ORAL_TABLET | Freq: Every day | ORAL | 3 refills | Status: DC

## 2022-09-20 MED ORDER — METOPROLOL SUCCINATE ER 25 MG PO TB24: 25.0000 mg | ORAL_TABLET | Freq: Every day | ORAL | 3 refills | Status: DC

## 2022-09-20 NOTE — Patient Instructions (Signed)
Medication Instructions:  Restart ezetimibe (Zetia) 10 mg daily Restart metoprolol succinate (Toprol XL) 25 mg daily *If you need a refill on your cardiac medications before your next appointment, please call your pharmacy*   Lab Work: Mag, BNP, CBC today Fasting lipids and lft's in 8 weeks If you have labs (blood work) drawn today and your tests are completely normal, you will receive your results only by: [facility] (if you have MyChart) OR A paper copy in the mail If you have any lab test that is abnormal or we need to change your treatment, we will call you to review the results.   Testing/Procedures: Your physician has requested that you have an echocardiogram. Echocardiography is a painless test that uses sound waves to create images of your heart. It provides your doctor with information about the size and shape of your heart and how well your heart's chambers and valves are working. This procedure takes approximately one hour. There are no restrictions for this procedure. Please do NOT wear cologne, perfume, aftershave, or lotions (deodorant is allowed). Please arrive 15 minutes prior to your appointment time.    Follow-Up: At [facility], you and your health needs are our priority.  As part of our continuing mission to provide you with exceptional heart care, we have created designated Provider Care Teams.  These Care Teams include your primary Cardiologist (physician) and Advanced Practice Providers (APPs -  Physician Assistants and Nurse Practitioners) who all work together to provide you with the care you need, when you need it.  We recommend signing up for the patient portal called "MyChart".  Sign up information is provided on this After Visit Summary.  MyChart is used to connect with patients for Virtual Visits (Telemedicine).  Patients are able to view lab/test results, encounter notes, upcoming appointments, etc.  Non-urgent messages can be sent to your  provider as well.   To learn more about what you can do with MyChart, go to [email or web address].    Your next appointment:   6 month(s)  Provider:   Sherren Mocha, MD

## 2022-09-20 NOTE — Progress Notes (Addendum)
Office Visit    Patient Name: [name] Date of Encounter: 09/20/2022  PCP:  [name], [facility]  Cardiologist:  Sherren Mocha, MD  Advanced Practice Provider:  Liliane Shi, PA-C Electrophysiologist:  Thompson Grayer, MD (Inactive)   HPI    [name] is a 88 y.o. male presents today for follow-up appointment,  Patient profile:     Hx of syncope Vasovagal (Admx 10/2018) NSVT  (HFrEF) heart failure with reduced ejection fraction (imp to normal) Hx of tachy mediated CM in 12/19 (AF w RVR) >> EF returned to normal in NSR Non-ischemic cardiomyopathy  Echocardiogram 04/2019: EF 35-40 Cath w mod non-obs CAD Echocardiogram 5/22: EF 55-60  Coronary artery disease  Cath 04/2019: mod non-obstructive CAD  Persistent atrial fibrillation  CHA2DS2-VASc Score = 8 [CHF History: 1, HTN History: 1, Diabetes History: 1, Stroke History: 2, Vascular Disease History: 1, Age Score: 2, Gender Score: 0].    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 Burt Knack - med Rx if remains asymptomatic) Pulmonary HTN  Diabetes mellitus  Hypertension  Hyperlipidemia  Chronic kidney disease  Prior CVA  OSA  Prostate CA  Dementia  RBBB   He was last seen by Dr. Burt Knack 03/23/2022 was doing well at that time.  Blood pressure was mildly elevated on Entresto.  Was not inclined to take more medicine at this time.  He was tolerating his oral anticoagulation with apixaban.  No major bleeding.  Today, he tells me that he recently had a stent placed in his aorta by Dr. Luan Pulling and does not feel good since then.  He sleeps most of the day and has extreme fatigue.  He recently did have a follow-up appointment with the vascular team and they thought that he might be  deconditioned.  I have convinced him to start back on some of his medications since he decided to discontinue most.  We discussed adding the essentials reviewed he was willing to try metoprolol succinate 25 mg daily and Zetia 10 mg daily in place of his rosuvastatin.  We also discussed some lab work today.  He seems a little depressed about his capacity these days.  He used to do a lot of traveling and would go to [place] a couple times a year but has not been back in 5 years.  Just feels like his age and mobility is limiting him in a lot of ways.  Reports no shortness of breath nor dyspnea on exertion. Reports no chest pain, pressure, or tightness. No edema, orthopnea, PND. Reports no palpitations.    Past Medical History    Past Medical History:  Diagnosis Date   (HFimpEF) heart failure with improved EF    Echo 04/2019: EF 35-40 // Echo 5/22: EF 55-60, no RWMA, GR 1 DD, moderate to severe MR, myxomatous mitral valve, similar to prior echocardiogram, mildly elevated PASP, RVSP 35.9, mild AI, mild AV sclerosis without AS, mild dilation of ascending aorta (43 mm)   A-fib (HCC)    RVR-DCCV   Abnormal PSA 01/17/2012   10.38/4/5/ PSA:2013=6.96/ PSA 07/08/2011=6.26/ PSA 2006=8.6   Aneurysm (HCC)    MILD 2.7 CM FUSIFORM llIAC ANEURYSM   Aortic insufficiency 10/01/2021   Echocardiogram March 2023: mild to mod AI  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/16/2021   Echocardiogram March 2023: 41 mm   Blood transfusion without reported diagnosis    during vagotomy 1976   Cancer ([place]) 2006   L-2 solitary metastatic lesion/no rad tx ,asymptomatic   Chronic kidney disease    nephrolithiasis right kidney   Coronary artery disease    ED (erectile dysfunction)    GERD (gastroesophageal reflux disease)    PUD S/P PARTIAL VAGOTOMY   Gynecomastia 04/21/2011   seen by Dr.Murray, no rad tx   H/O impacted cerumen    S/P ENT DR BATES   History of echocardiogram    echo 4/16:  mild LVH, EF 55-60%, Gr 1 DD, no  RWMA, mild AI, mild MR, mod LAE, mild TR   Hx of cardiovascular stress test    Myoview 5/16:  EF 50%, no scar or ischemia, low risk   Hydroureteronephrosis    right    Hyperglycemia    WITHOUT DM2   Hyperlipidemia    Hypertension    Insomnia    Migraines    WITH AURA   Mitral regurgitation 08/10/2018   Echo 11/04/20: EF 55-60, no RWMA, Gr 1 DD, mod to severe MR, normal RVSF, RVSP 35.9, mild LAE, mild AI, AV sclerosis w/o AS, ascending aorta 43 mm (mild dilation) Echo 3/23: EF 60-65, no RWMA, mild LVH, GR 1 DD, normal RVSF, mildly elevated PASP, moderate LAE, moderate MR, mild to moderate AI, dilated ascending aorta (41 mm), dilated aortic root (40 mm), RVSP 36.4   Nephrolithiasis    Osteopenia    Overweight    Prostate cancer ([place]) 12/09/03 dx   Prostate ca/adenocarcinoma,gleason=3+3=6    pSA 4.6   Prostate cancer ([place])    METASTATIC WITH L2 SCLEROTIC LESION ON LUPRON------DR. WOODRUFF   RBBB (right bundle branch block)    Rosacea    Sinus bradycardia    Sleep apnea    pt unaware, no sleep study   Stroke [facility])    asymptomatic, found on head imaging   Ulcer    peptic   Use of leuprolide acetate (Lupron) 06/2005-11/2006   02/2008 degarelix 02/2008 x 3 doses   Past Surgical History:  Procedure Laterality Date   ABDOMINAL AORTIC ENDOVASCULAR STENT GRAFT Right 10/28/2021   Procedure: ABDOMINAL AORTIC ENDOVASCULAR GRAFT WITH RIGHT EPIGASTRIC ARTERY COILING;  Surgeon: Cherre Robins, MD;  Location: [place];  Service: Vascular;  Laterality: Right;   CARDIOVERSION N/A 06/19/2018   Procedure: CARDIOVERSION;  Surgeon: Larey Dresser, MD;  Location: [place];  Service: Cardiovascular;  Laterality: N/A;   PROSTATE BIOPSY  12/09/03   adenocarcinoma,glerason: 3+3=6   RIGHT/LEFT HEART CATH AND CORONARY ANGIOGRAPHY N/A 04/25/2019   Procedure: RIGHT/LEFT HEART CATH AND CORONARY ANGIOGRAPHY;  Surgeon: Leonie Man, MD;  Location: [place] CV LAB;  Service: Cardiovascular;  Laterality: N/A;    TEE WITHOUT CARDIOVERSION N/A 06/19/2018   Procedure: TRANSESOPHAGEAL ECHOCARDIOGRAM (TEE);  Surgeon: Larey Dresser, MD;  Location: [facility] ENDOSCOPY;  Service: Cardiovascular;  Laterality: N/A;   VAGOTOMY  1978   partial    Allergies  Allergies  Allergen Reactions   Aspirin Other (See Comments)    History of "stomach ulcers," was told to not take this      EKGs/Labs/Other Studies Reviewed:   The following studies were reviewed today:  Cardiac catheterization 04/25/2019: SUMMARY Diffuse mild to moderate CAD: Most notable 60% proximal OM1, 50 to 60% mid LAD at D2.  No obvious culprit lesion to explain significantly reduced EF.  Mild proximal LM and ostial LCx-calcified lesions. Mild pulmonary hypertension with mean PA pressure of 28 mmHg.  LVEDP and PCWP from 12 to 15 mmHg. Borderline CO-CI   RECOMMENDATIONS Return to nursing unit for ongoing care, TR band removal. Would continue aggressive risk factor modification and heart failure medications.   Appears to be at relatively well diuresed, would titrate up afterload reduction If he has any concerning symptoms for possible angina, would consider noninvasive evaluation with Myoview, or perhaps coronary CTA to determine presence of either anterior or lateral ischemia suggesting LAD or OM lesions are more significant than they appear angiographically.   Myocardial Perfusion Scan 10/01/2021:   Findings are consistent with prior myocardial infarction and no peri-infarct ischemia. The study is intermediate risk due to depresesd ejection fraction.   No ST deviation was noted.   Left ventricular function is abnormal. Global function is moderately reduced. Nuclear stress EF: 41 %. The left ventricular ejection fraction is moderately decreased (30-44%). End diastolic cavity size is moderately enlarged.   Prior study not available for comparison.   Echo 09/30/2021: 1. Left ventricular ejection fraction, by estimation, is 60 to 65%. The  left  ventricle has normal function. The left ventricle has no regional  wall motion abnormalities. There is mild concentric left ventricular  hypertrophy. Left ventricular diastolic  parameters are consistent with Grade I diastolic dysfunction (impaired  relaxation).   2. Right ventricular systolic function is normal. The right ventricular  size is normal. There is mildly elevated pulmonary artery systolic  pressure.   3. Left atrial size was moderately dilated.   4. The mitral valve is myxomatous. Eccentric anteriorly directed moderate  mitral valve regurgitation. No evidence of mitral stenosis.   5. The aortic valve is tricuspid. There is mild thickening of the aortic  valve. Aortic valve regurgitation is mild to moderate. No aortic stenosis  is present.   6. There is dilatation of the ascending aorta, measuring 41 mm. There is  dilatation of the aortic root, measuring 40 mm.   7. The inferior vena cava is normal in size with greater than 50%  respiratory variability, suggesting right atrial pressure of 3 mmHg.   EKG:  EKG is  ordered today.  The ekg ordered today demonstrates normal sinus rhythm, rate 78 bpm with RBBB (old), and occasional PVC.  Bifascicular block, stable in appearance  Recent Labs: 10/20/2021: ALT 21 10/28/2021: Magnesium 1.6 03/23/2022: BUN 19; Creatinine, Ser 0.93; Hemoglobin 14.5; Platelets 204; Potassium 4.5; Sodium 143  Recent Lipid Panel    Component Value Date/Time   CHOL 129 04/22/2019 1759   TRIG 118 04/22/2019 1759   HDL 34 (L) 04/22/2019 1759   CHOLHDL 3.8 04/22/2019 1759   VLDL 24 04/22/2019 1759   LDLCALC 71 04/22/2019 1759    Home Medications   Current Meds  Medication Sig   Carboxymethylcellulose Sodium (LUBRICANT EYE DROPS OP) Place 1 drop into both eyes daily as needed (dry eyes).   ELIQUIS 5 MG TABS tablet TAKE 1 TABLET(5 MG) BY MOUTH TWICE DAILY   ezetimibe (ZETIA) 10 MG tablet Take 1 tablet (10 mg total) by mouth daily.   metoprolol  succinate (TOPROL XL) 25 MG 24 hr tablet Take 1 tablet (25 mg total) by mouth daily.   sacubitril-valsartan (ENTRESTO) 97-103 MG Take 1 tablet by mouth 2 (two) times daily.     Review of Systems      All other systems reviewed and are otherwise negative except as noted above.  Physical Exam  VS:  BP (!) 158/90   Pulse 78   Ht 6' (1.829 m)   Wt 245 lb (111.1 kg)   SpO2 95%   BMI 33.23 kg/m  , BMI Body mass index is 33.23 kg/m.  Wt Readings from Last 3 Encounters:  09/20/22 245 lb (111.1 kg)  09/14/22 243 lb (110.2 kg)  03/23/22 243 lb 3.2 oz (110.3 kg)     GEN: Well nourished, well developed, in no acute distress. HEENT: normal. Neck: Supple, no JVD, carotid bruits, or masses. Cardiac: RRR, no murmurs, rubs, or gallops. No clubbing, cyanosis, edema.  Radials/PT 2+ and equal bilaterally.  Respiratory:  Respirations regular and unlabored, clear to auscultation bilaterally. GI: Soft, nontender, nondistended. MS: No deformity or atrophy. Skin: Warm and dry, no rash. Neuro:  Strength and sensation are intact. Psych: Normal affect.  Assessment & Plan    Coronary artery disease -echo update -add metoprolol succinate and zetia -Sounds like some of his fatigue could be due to his recent endovascular stent and deconditioning  Paroxysmal atrial fibrillation -He is in normal sinus rhythm today -I explained why metoprolol is so important -Continue current medications including Eliquis 5 mg twice a day and aspirin 81 mg daily  HFrEF -Euvolemic on exam today -Continue current medications including Entresto 97-103 mg daily -Blood pressure remains elevated but hopefully will get better control and started back on metoprolol  Nonrheumatic mitral valve regurgitation -echo showed mod-severe MR, reviewed with Dr. Excell Seltzer and recommend update echo in a year  Ascending aortic dilation -update echo -please keep close watch on your BP  Mixed hyperlipidemia -Started on Zetia 10 mg  today -Will plan to repeat lipid panel and LFTs in 6 to 8 weeks -May need to discuss injectable options if still not  Hypertension -Blood pressure elevated today -We have added metoprolol succinate 25 mg daily and asked him to keep track of his blood pressure at home -We discussed why blood pressure control is important and I would encourage medication compliance   Disposition: Follow up 1 year with Tonny Bollman, MD or APP.  Signed, Sharlene Dory, PA-C 09/20/2022, 4:58 PM [facility]

## 2022-09-22 LAB — CBC
Hematocrit: 47 % (ref 37.5–51.0)
Hemoglobin: 15 g/dL (ref 13.0–17.7)
MCH: 28.8 pg (ref 26.6–33.0)
MCHC: 31.9 g/dL (ref 31.5–35.7)
MCV: 90 fL (ref 79–97)
Platelets: 187 10*3/uL (ref 150–450)
RBC: 5.21 x10E6/uL (ref 4.14–5.80)
RDW: 13.5 % (ref 11.6–15.4)
WBC: 8.1 10*3/uL (ref 3.4–10.8)

## 2022-09-22 LAB — PRO B NATRIURETIC PEPTIDE: NT-Pro BNP: 215 pg/mL (ref 0–486)

## 2022-09-22 LAB — MAGNESIUM: Magnesium: 2.3 mg/dL (ref 1.6–2.3)

## 2022-09-23 ENCOUNTER — Other Ambulatory Visit: Payer: Self-pay

## 2022-09-23 DIAGNOSIS — Z9889 Other specified postprocedural states: Secondary | ICD-10-CM

## 2022-10-18 ENCOUNTER — Ambulatory Visit (HOSPITAL_COMMUNITY): Payer: Medicare Other | Attending: Cardiology

## 2022-10-18 DIAGNOSIS — I340 Nonrheumatic mitral (valve) insufficiency: Secondary | ICD-10-CM

## 2022-10-18 LAB — ECHOCARDIOGRAM COMPLETE
Area-P 1/2: 1.96 cm2
MV M vel: 4.63 m/s
MV Peak grad: 85.7 mmHg
P 1/2 time: 492 msec
S' Lateral: 4.5 cm

## 2022-10-25 ENCOUNTER — Ambulatory Visit (HOSPITAL_COMMUNITY)
Admission: RE | Admit: 2022-10-25 | Discharge: 2022-10-25 | Disposition: A | Discharge status: Home / Self Care | Payer: Medicare Other | Source: Ambulatory Visit | Attending: Vascular Surgery | Admitting: Vascular Surgery

## 2022-10-25 DIAGNOSIS — Z8679 Personal history of other diseases of the circulatory system: Secondary | ICD-10-CM | POA: Diagnosis present

## 2022-10-25 DIAGNOSIS — Z9889 Other specified postprocedural states: Secondary | ICD-10-CM | POA: Diagnosis present

## 2022-10-25 MED ORDER — IOHEXOL 350 MG/ML SOLN
75.0000 mL | Freq: Once | INTRAVENOUS | Status: AC | PRN
  Administered 2022-10-25: 75 mL via INTRAVENOUS

## 2022-10-28 ENCOUNTER — Ambulatory Visit (INDEPENDENT_AMBULATORY_CARE_PROVIDER_SITE_OTHER): Payer: Self-pay | Admitting: Vascular Surgery

## 2022-10-28 DIAGNOSIS — Z9889 Other specified postprocedural states: Secondary | ICD-10-CM

## 2022-10-28 DIAGNOSIS — Z8679 Personal history of other diseases of the circulatory system: Secondary | ICD-10-CM

## 2022-10-28 DIAGNOSIS — I723 Aneurysm of iliac artery: Secondary | ICD-10-CM

## 2022-10-28 NOTE — Progress Notes (Signed)
I reviewed the patient's CT scan in detail with him via telephone.  There is no evidence of type II endoleak.  The aneurysm sac is occluded.  Overall low concern on CT scan for technical problem with the endovascular repair.  We will see him again in 1 year with repeat abdominal aortic duplex to remeasure of the sac into look for evidence of late leak.  Rande Brunt. Lenell Antu, MD [facility] Phone Number: [phone] 10/28/2022 3:00 PM

## 2022-11-08 ENCOUNTER — Other Ambulatory Visit: Payer: Self-pay

## 2022-11-08 DIAGNOSIS — Z9889 Other specified postprocedural states: Secondary | ICD-10-CM

## 2022-11-15 ENCOUNTER — Ambulatory Visit: Payer: Medicare Other | Attending: Physician Assistant

## 2022-11-15 DIAGNOSIS — I77810 Thoracic aortic ectasia: Secondary | ICD-10-CM

## 2022-11-15 DIAGNOSIS — I5020 Unspecified systolic (congestive) heart failure: Secondary | ICD-10-CM

## 2022-11-15 DIAGNOSIS — I340 Nonrheumatic mitral (valve) insufficiency: Secondary | ICD-10-CM

## 2022-11-15 DIAGNOSIS — I10 Essential (primary) hypertension: Secondary | ICD-10-CM

## 2022-11-15 DIAGNOSIS — E782 Mixed hyperlipidemia: Secondary | ICD-10-CM

## 2022-11-15 DIAGNOSIS — E785 Hyperlipidemia, unspecified: Secondary | ICD-10-CM

## 2022-11-16 LAB — LIPID PANEL
Chol/HDL Ratio: 5.1 ratio — ABNORMAL HIGH (ref 0.0–5.0)
Cholesterol, Total: 213 mg/dL — ABNORMAL HIGH (ref 100–199)
HDL: 42 mg/dL (ref >39)
LDL Chol Calc (NIH): 133 mg/dL — ABNORMAL HIGH (ref 0–99)
Triglycerides: 215 mg/dL — ABNORMAL HIGH (ref 0–149)
VLDL Cholesterol Cal: 38 mg/dL (ref 5–40)

## 2022-11-16 LAB — HEPATIC FUNCTION PANEL
ALT: 16 IU/L (ref 0–44)
AST: 16 IU/L (ref 0–40)
Albumin: 4.2 g/dL (ref 3.7–4.7)
Alkaline Phosphatase: 94 IU/L (ref 44–121)
Bilirubin Total: 0.5 mg/dL (ref 0.0–1.2)
Bilirubin, Direct: 0.12 mg/dL (ref 0.00–0.40)
Total Protein: 6.7 g/dL (ref 6.0–8.5)

## 2022-11-18 ENCOUNTER — Other Ambulatory Visit: Payer: Self-pay

## 2022-11-18 DIAGNOSIS — E782 Mixed hyperlipidemia: Secondary | ICD-10-CM

## 2022-11-18 MED ORDER — ROSUVASTATIN CALCIUM 5 MG PO TABS
5.0000 mg | ORAL_TABLET | Freq: Every day | ORAL | 3 refills | Status: DC
Start: 2022-11-18 — End: 2023-03-22

## 2023-01-18 ENCOUNTER — Ambulatory Visit: Payer: Medicare Other | Attending: Internal Medicine

## 2023-02-17 ENCOUNTER — Telehealth: Payer: Self-pay | Admitting: Cardiovascular Disease

## 2023-02-17 NOTE — Telephone Encounter (Signed)
Pt c/o medication issue:  1. Name of Medication:   rosuvastatin (CRESTOR) 5 MG tablet  ELIQUIS 5 MG TABS tablet   2. How are you currently taking this medication (dosage and times per day)?  As prescribed  3. Are you having a reaction (difficulty breathing--STAT)?   Terrible diarrhea  4. What is your medication issue?   Patient stated he tried these medication but does not want to continue taking these medications.  Patient stated he has stopped taking these medications and wants alternate medication.

## 2023-02-17 NOTE — Telephone Encounter (Signed)
Returned call to patient who states that he's had diarrhea off an on for months. Says that he's been dealing with it, but it's reached the point that he spends half the day in the bathroom. He has stopped both Eliquis and Entresto medications entirely. Advised that he try starting them individually to see if diarrhea resumes. He agrees, stating he will try Eliquis again. Explained that I would ask pharmacy team here if either of these drugs are notorious for causing diarrhea and report back to him.

## 2023-02-18 NOTE — Telephone Encounter (Signed)
Neither Eliquis nor Entresto are known to cause diarrhea, and I have never had that complaint from anyone.  Have him stop the ezetimibe, that would be the only one that might be the issue.

## 2023-02-18 NOTE — Telephone Encounter (Signed)
Returned call to patient to review pharmacist's comments. He is adamant that it's either Entresto or Eliquis causing his diarrhea and states he will do as discussed to restart them individually with two weeks in between to see if either provoke diarrhea for him. If so, he will call us back and we can mark his chart with an intolerance and see what alternative therapy can be prescribed.

## 2023-02-21 ENCOUNTER — Other Ambulatory Visit: Payer: Self-pay | Admitting: Cardiovascular Disease

## 2023-02-21 DIAGNOSIS — I480 Paroxysmal atrial fibrillation: Secondary | ICD-10-CM

## 2023-02-21 NOTE — Telephone Encounter (Signed)
Prescription refill request for Eliquis received. Indication:afib Last office visit:3/24 Scr:0.93  9/23 Age: 88 Weight:111.1  kg  Prescription refilled

## 2023-03-22 ENCOUNTER — Ambulatory Visit: Payer: Medicare Other | Attending: Cardiovascular Disease | Admitting: Cardiovascular Disease

## 2023-03-22 ENCOUNTER — Encounter: Payer: Self-pay | Admitting: Cardiovascular Disease

## 2023-03-22 VITALS — BP 138/80 | HR 83 | Ht 72.0 in | Wt 247.8 lb

## 2023-03-22 DIAGNOSIS — I480 Paroxysmal atrial fibrillation: Secondary | ICD-10-CM

## 2023-03-22 DIAGNOSIS — E782 Mixed hyperlipidemia: Secondary | ICD-10-CM

## 2023-03-22 DIAGNOSIS — Z9889 Other specified postprocedural states: Secondary | ICD-10-CM

## 2023-03-22 DIAGNOSIS — I10 Essential (primary) hypertension: Secondary | ICD-10-CM

## 2023-03-22 DIAGNOSIS — I77810 Thoracic aortic ectasia: Secondary | ICD-10-CM | POA: Diagnosis not present

## 2023-03-22 DIAGNOSIS — I5020 Unspecified systolic (congestive) heart failure: Secondary | ICD-10-CM

## 2023-03-22 DIAGNOSIS — Z8679 Personal history of other diseases of the circulatory system: Secondary | ICD-10-CM

## 2023-03-22 DIAGNOSIS — I2510 Atherosclerotic heart disease of native coronary artery without angina pectoris: Secondary | ICD-10-CM | POA: Diagnosis not present

## 2023-03-22 NOTE — Patient Instructions (Signed)
Medication Instructions:  Your physician recommends that you continue on your current medications as directed. Please refer to the Current Medication list given to you today.  *If you need a refill on your cardiac medications before your next appointment, please call your pharmacy*   Lab Work: NONE If you have labs (blood work) drawn today and your tests are completely normal, you will receive your results only by: MyChart Message (if you have MyChart) OR A paper copy in the mail If you have any lab test that is abnormal or we need to change your treatment, we will call you to review the results.   Testing/Procedures: ECHO (in 6 months) Your physician has requested that you have an echocardiogram. Echocardiography is a painless test that uses sound waves to create images of your heart. It provides your doctor with information about the size and shape of your heart and how well your heart's chambers and valves are working. This procedure takes approximately one hour. There are no restrictions for this procedure. Please do NOT wear cologne, perfume, aftershave, or lotions (deodorant is allowed). Please arrive 15 minutes prior to your appointment time.    Follow-Up: At [facility], you and your health needs are our priority.  As part of our continuing mission to provide you with exceptional heart care, we have created designated Provider Care Teams.  These Care Teams include your primary Cardiologist (physician) and Advanced Practice Providers (APPs -  Physician Assistants and Nurse Practitioners) who all work together to provide you with the care you need, when you need it.  We recommend signing up for the patient portal called "MyChart".  Sign up information is provided on this After Visit Summary.  MyChart is used to connect with patients for Virtual Visits (Telemedicine).  Patients are able to view lab/test results, encounter notes, upcoming appointments, etc.  Non-urgent messages  can be sent to your provider as well.   To learn more about what you can do with MyChart, go to [email or web address].    Your next appointment:   6 month(s)  Provider:   Tonny Bollman, MD

## 2023-03-22 NOTE — Progress Notes (Signed)
Cardiology Office Note:    Date:  03/22/2023   ID:  [name], DOB 01-21-1935, MRN [id number]  PCP:  [name], MD   [facility] Providers Cardiologist:  Tonny Bollman, MD Cardiology APP:  Beatrice Lecher, PA-C  Electrophysiologist:  [name], MD (Inactive)     Referring MD: [name], MD   Chief Complaint  Patient presents with   Atrial Fibrillation    History of Present Illness:    [name] is a 88 y.o. male with a hx of:  Hx of syncope Vasovagal (Admx 10/2018) NSVT  (HFrEF) heart failure with reduced ejection fraction (imp to normal) Hx of tachy mediated CM in 12/19 (AF w RVR) >> EF returned to normal in NSR Non-ischemic cardiomyopathy  Echocardiogram 04/2019: EF 35-40 Cath w mod non-obs CAD Echocardiogram 5/22: EF 55-60  Coronary artery disease  Cath 04/2019: mod non-obstructive CAD  Persistent atrial fibrillation  CHA2DS2-VASc Score = 8 [CHF History: 1, HTN History: 1, Diabetes History: 1, Stroke History: 2, Vascular Disease History: 1, Age Score: 2, Gender Score: 0].    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 Excell Seltzer - med Rx if remains asymptomatic) Pulmonary HTN  Diabetes mellitus  Hypertension  Hyperlipidemia  Chronic kidney disease  Prior CVA  OSA  Prostate CA  Dementia  RBBB   The patient is here alone today.  He complains of gait unsteadiness.  He had a fall a few weeks ago with injury to his left upper leg.  He is getting around okay again but has had some swelling in his left leg.  Pain is essentially resolved.  No chest pain or chest pressure.  He does have some mild exertional dyspnea and fatigue.  No orthopnea or PND.  He is now only taking Entresto and apixaban as well as Zetia.  He stopped his statin drug because  of diarrhea.  Past Medical History:  Diagnosis Date   (HFimpEF) heart failure with improved EF    Echo 04/2019: EF 35-40 // Echo 5/22: EF 55-60, no RWMA, GR 1 DD, moderate to severe MR, myxomatous mitral valve, similar to prior echocardiogram, mildly elevated PASP, RVSP 35.9, mild AI, mild AV sclerosis without AS, mild dilation of ascending aorta (43 mm)   A-fib (HCC)    RVR-DCCV   Abnormal PSA 01/17/2012   10.38/4/5/ PSA:2013=6.96/ PSA 07/08/2011=6.26/ PSA 2006=8.6   Aneurysm (HCC)    MILD 2.7 CM FUSIFORM llIAC ANEURYSM   Aortic insufficiency 10/01/2021   Echocardiogram March 2023: mild to mod AI   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/16/2021   Echocardiogram March 2023: 41 mm   Blood transfusion without reported diagnosis    during vagotomy 1976   Cancer (HCC) 2006   L-2 solitary metastatic lesion/no rad tx ,asymptomatic   Chronic kidney disease    nephrolithiasis right kidney   Coronary artery disease    ED (erectile dysfunction)    GERD (gastroesophageal reflux disease)    PUD S/P PARTIAL VAGOTOMY   Gynecomastia 04/21/2011   seen by Dr.Murray, no rad tx   H/O impacted cerumen    S/P ENT DR BATES   History of echocardiogram    echo 4/16:  mild LVH, EF 55-60%, Gr 1 DD, no RWMA, mild AI, mild MR, mod LAE, mild TR  Hx of cardiovascular stress test    Myoview 5/16:  EF 50%, no scar or ischemia, low risk   Hydroureteronephrosis    right    Hyperglycemia    WITHOUT DM2   Hyperlipidemia    Hypertension    Insomnia    Migraines    WITH AURA   Mitral regurgitation 08/10/2018   Echo 11/04/20: EF 55-60, no RWMA, Gr 1 DD, mod to severe MR, normal RVSF, RVSP 35.9, mild LAE, mild AI, AV sclerosis w/o AS, ascending aorta 43 mm (mild dilation) Echo 3/23: EF 60-65, no RWMA, mild LVH, GR 1 DD, normal RVSF, mildly elevated PASP, moderate LAE, moderate MR, mild to moderate AI, dilated ascending aorta (41 mm), dilated aortic root (40 mm), RVSP 36.4   Nephrolithiasis    Osteopenia    Overweight     Prostate cancer (HCC) 12/09/03 dx   Prostate ca/adenocarcinoma,gleason=3+3=6    pSA 4.6   Prostate cancer (HCC)    METASTATIC WITH L2 SCLEROTIC LESION ON LUPRON------DR. WOODRUFF   RBBB (right bundle branch block)    Rosacea    Sinus bradycardia    Sleep apnea    pt unaware, no sleep study   Stroke [facility])    asymptomatic, found on head imaging   Ulcer    peptic   Use of leuprolide acetate (Lupron) 06/2005-11/2006   02/2008 degarelix 02/2008 x 3 doses    Past Surgical History:  Procedure Laterality Date   ABDOMINAL AORTIC ENDOVASCULAR STENT GRAFT Right 10/28/2021   Procedure: ABDOMINAL AORTIC ENDOVASCULAR GRAFT WITH RIGHT EPIGASTRIC ARTERY COILING;  Surgeon: Leonie Douglas, MD;  Location: [facility] OR;  Service: Vascular;  Laterality: Right;   CARDIOVERSION N/A 06/19/2018   Procedure: CARDIOVERSION;  Surgeon: Laurey Morale, MD;  Location: [facility] ENDOSCOPY;  Service: Cardiovascular;  Laterality: N/A;   PROSTATE BIOPSY  12/09/03   adenocarcinoma,glerason: 3+3=6   RIGHT/LEFT HEART CATH AND CORONARY ANGIOGRAPHY N/A 04/25/2019   Procedure: RIGHT/LEFT HEART CATH AND CORONARY ANGIOGRAPHY;  Surgeon: Marykay Lex, MD;  Location: [facility] INVASIVE CV LAB;  Service: Cardiovascular;  Laterality: N/A;   TEE WITHOUT CARDIOVERSION N/A 06/19/2018   Procedure: TRANSESOPHAGEAL ECHOCARDIOGRAM (TEE);  Surgeon: Laurey Morale, MD;  Location: [facility] ENDOSCOPY;  Service: Cardiovascular;  Laterality: N/A;   VAGOTOMY  1978   partial    Current Medications: Current Meds  Medication Sig   Carboxymethylcellulose Sodium (LUBRICANT EYE DROPS OP) Place 1 drop into both eyes daily as needed (dry eyes).   ELIQUIS 5 MG TABS tablet TAKE 1 TABLET(5 MG) BY MOUTH TWICE DAILY   ezetimibe (ZETIA) 10 MG tablet Take 1 tablet (10 mg total) by mouth daily.   [DISCONTINUED] metoprolol succinate (TOPROL XL) 25 MG 24 hr tablet Take 1 tablet (25 mg total) by mouth daily.   [DISCONTINUED] rosuvastatin (CRESTOR) 5 MG tablet Take 1 tablet (5  mg total) by mouth daily.     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  Widower.   Retired Engineer, mining.   Social Determinants of Health   Financial Resource Strain: Not on file  Food Insecurity: Not on file  Transportation Needs: Not on file  Physical Activity: Not on file  Stress: Not on file  Social Connections: Not on file     Family History: The patient's family history includes Kidney Stones in his father; Liver cancer in his father. There is no history of Colon cancer or Stroke.  ROS:   Please see the history of present illness.    All other systems reviewed and are negative.  EKGs/Labs/Other Studies Reviewed:    The following studies were reviewed today: Echo 10/18/2022: 1. Left ventricular ejection fraction, by estimation, is 55 to 60%. The  left ventricle has normal function. The left ventricle has no regional  wall motion abnormalities. The left ventricular internal cavity size was  mildly dilated. There is moderate  left ventricular hypertrophy. Left ventricular diastolic parameters are  consistent with Grade I diastolic dysfunction (impaired relaxation).   2. Right ventricular systolic function is normal. The right ventricular  size is mildly enlarged. There is normal pulmonary artery systolic  pressure. The estimated right ventricular  systolic pressure is 35.3 mmHg.   3. Left atrial size was moderately dilated.   4. The mitral valve is normal in structure. Severe mitral valve  regurgitation. No evidence of mitral stenosis.   5. The aortic valve is tricuspid. Aortic valve regurgitation is mild. No  aortic stenosis is present.   6. The inferior vena cava is normal in size with greater than 50%  respiratory variability, suggesting right atrial pressure of 3 mmHg.   Comparison(s): Prior images reviewed side by side.       Recent Labs: 03/23/2022: BUN 19; Creatinine, Ser 0.93; Potassium 4.5; Sodium 143 09/20/2022: Hemoglobin 15.0; Magnesium 2.3; NT-Pro BNP 215; Platelets 187 11/15/2022: ALT 16  Recent Lipid Panel    Component Value Date/Time   CHOL 213 (H) 11/15/2022 1329   TRIG 215 (H) 11/15/2022 1329   HDL 42 11/15/2022 1329   CHOLHDL 5.1 (H) 11/15/2022 1329   CHOLHDL 3.8 04/22/2019 1759   VLDL 24 04/22/2019 1759   LDLCALC 133 (H) 11/15/2022 1329     Risk Assessment/Calculations:                Physical Exam:    VS:  BP 138/80   Pulse 83   Ht 6' (1.829 m)   Wt 247 lb 12.8 oz (112.4 kg)   SpO2 95%   BMI 33.61 kg/m     Wt Readings from Last 3 Encounters:  03/22/23 247 lb 12.8 oz (112.4 kg)  09/20/22 245 lb (111.1 kg)  09/14/22 243 lb (110.2 kg)     GEN:  Well nourished, well developed in no acute distress HEENT: Normal NECK: No JVD; No carotid bruits LYMPHATICS: No lymphadenopathy CARDIAC: irregular, no murmurs, rubs, gallops RESPIRATORY:  Clear to auscultation without rales, wheezing or rhonchi  ABDOMEN: Soft, non-tender, non-distended MUSCULOSKELETAL: 1+ edema in the left pretibial region, trace on the right; No deformity  SKIN: Warm and dry NEUROLOGIC:  Alert and oriented x 3 PSYCHIATRIC:  Normal affect   ASSESSMENT:    1. Paroxysmal atrial fibrillation (HCC)   2. Ascending aorta dilation (HCC)  3. Coronary artery disease involving native coronary artery of native heart without  angina pectoris   4. Status post endovascular aneurysm repair (EVAR)   5. HFrEF (heart failure with reduced ejection fraction) (HCC)   6. Essential hypertension   7. Mixed hyperlipidemia    PLAN:    In order of problems listed above:  Patient appears clinically stable on apixaban, tolerating well.  Labs from March of this year reviewed with no anemia. Echo from April reviewed with normal aortic dimensions, noted to be mildly dilated in the past No anginal symptoms.  Patient with moderate nonobstructive disease noted. Followed by vascular surgery, CTA study from April reviewed with well opposed stent graft. Patient's LVEF is normalized on medical therapy with Entresto.  He has moderate to severe mitral regurgitation noted over serial echo studies.  Will repeat an echo prior to his next office visit in 6 months. Blood pressure controlled on current medical program He discontinued his statin because of diarrhea.  Continue Zetia.  The patient plans to relocate to [facility] with his daughter at the end of the year.  Just in case he stays in [place], we will arrange a 61-month follow-up visit with an echocardiogram for surveillance.  If he ends up moving, he will send for records.           Medication Adjustments/Labs and Tests Ordered: Current medicines are reviewed at length with the patient today.  Concerns regarding medicines are outlined above.  Orders Placed This Encounter  Procedures   ECHOCARDIOGRAM COMPLETE   No orders of the defined types were placed in this encounter.   Patient Instructions  Medication Instructions:  Your physician recommends that you continue on your current medications as directed. Please refer to the Current Medication list given to you today.  *If you need a refill on your cardiac medications before your next appointment, please call your pharmacy*  Lab Work: NONE If you have labs (blood work) drawn today and your tests are completely normal, you  will receive your results only by: MyChart Message (if you have MyChart) OR A paper copy in the mail If you have any lab test that is abnormal or we need to change your treatment, we will call you to review the results.  Testing/Procedures: ECHO( in 6 months) Your physician has requested that you have an echocardiogram. Echocardiography is a painless test that uses sound waves to create images of your heart. It provides your doctor with information about the size and shape of your heart and how well your heart's chambers and valves are working. This procedure takes approximately one hour. There are no restrictions for this procedure. Please do NOT wear cologne, perfume, aftershave, or lotions (deodorant is allowed). Please arrive 15 minutes prior to your appointment time.  Follow-Up: At [facility], you and your health needs are our priority.  As part of our continuing mission to provide you with exceptional heart care, we have created designated Provider Care Teams.  These Care Teams include your primary Cardiologist (physician) and Advanced Practice Providers (APPs -  Physician Assistants and Nurse Practitioners) who all work together to provide you with the care you need, when you need it.  We recommend signing up for the patient portal called "MyChart".  Sign up information is provided on this After Visit Summary.  MyChart is used to connect with patients for Virtual Visits (Telemedicine).  Patients are able to view lab/test results, encounter notes, upcoming appointments, etc.  Non-urgent messages can be sent  to your provider as well.   To learn more about what you can do with MyChart, go to [email or web address].    Your next appointment:   6 month(s)  Provider:   Tonny Bollman, MD        Signed, Tonny Bollman, MD  03/22/2023 4:37 PM    [facility]

## 2023-03-26 ENCOUNTER — Emergency Department (HOSPITAL_COMMUNITY)
Admission: EM | Admit: 2023-03-26 | Discharge: 2023-03-26 | Disposition: A | Discharge status: Home / Self Care | Payer: Medicare Other | Attending: Emergency Medicine | Admitting: Emergency Medicine

## 2023-03-26 ENCOUNTER — Emergency Department (HOSPITAL_COMMUNITY): Payer: Medicare Other

## 2023-03-26 ENCOUNTER — Emergency Department (HOSPITAL_BASED_OUTPATIENT_CLINIC_OR_DEPARTMENT_OTHER)
Admit: 2023-03-26 | Discharge: 2023-03-26 | Disposition: A | Discharge status: Home / Self Care | Payer: Medicare Other | Attending: Emergency Medicine | Admitting: Emergency Medicine

## 2023-03-26 ENCOUNTER — Encounter (HOSPITAL_COMMUNITY): Payer: Self-pay

## 2023-03-26 DIAGNOSIS — Z7901 Long term (current) use of anticoagulants: Secondary | ICD-10-CM | POA: Insufficient documentation

## 2023-03-26 DIAGNOSIS — M7989 Other specified soft tissue disorders: Secondary | ICD-10-CM | POA: Diagnosis not present

## 2023-03-26 DIAGNOSIS — N189 Chronic kidney disease, unspecified: Secondary | ICD-10-CM | POA: Insufficient documentation

## 2023-03-26 DIAGNOSIS — I4891 Unspecified atrial fibrillation: Secondary | ICD-10-CM | POA: Diagnosis not present

## 2023-03-26 DIAGNOSIS — T148XXA Other injury of unspecified body region, initial encounter: Secondary | ICD-10-CM

## 2023-03-26 DIAGNOSIS — S8012XA Contusion of left lower leg, initial encounter: Secondary | ICD-10-CM | POA: Insufficient documentation

## 2023-03-26 DIAGNOSIS — M79605 Pain in left leg: Secondary | ICD-10-CM | POA: Diagnosis present

## 2023-03-26 DIAGNOSIS — W010XXA Fall on same level from slipping, tripping and stumbling without subsequent striking against object, initial encounter: Secondary | ICD-10-CM | POA: Diagnosis not present

## 2023-03-26 DIAGNOSIS — Z8546 Personal history of malignant neoplasm of prostate: Secondary | ICD-10-CM | POA: Insufficient documentation

## 2023-03-26 DIAGNOSIS — I509 Heart failure, unspecified: Secondary | ICD-10-CM | POA: Diagnosis not present

## 2023-03-26 DIAGNOSIS — Y9301 Activity, walking, marching and hiking: Secondary | ICD-10-CM | POA: Insufficient documentation

## 2023-03-26 DIAGNOSIS — W19XXXA Unspecified fall, initial encounter: Secondary | ICD-10-CM

## 2023-03-26 MED ORDER — ACETAMINOPHEN 325 MG PO TABS
650.0000 mg | ORAL_TABLET | Freq: Once | ORAL | Status: AC
  Administered 2023-03-26: 650 mg via ORAL
  Filled 2023-03-26: qty 2

## 2023-03-26 NOTE — ED Triage Notes (Signed)
Pt presents with c/o fall and left leg pain. Pt reports he fell approx one week ago and since then, his left leg has been very painful and swollen as well as some bruising. Pt reports he does not normally use a cane for walking but has been using a cane since then. Pt reports a sharp stabbing pain when he walks.

## 2023-03-26 NOTE — ED Provider Notes (Signed)
[facility] EMERGENCY DEPARTMENT AT [facility] Provider Note   CSN: [id number] Arrival date & time: 03/26/23  1339     History  Chief Complaint  Patient presents with   Fall   Leg Pain    [name] is a 88 y.o. male history of prostate cancer, stroke, heart failure, atrial fibrillation, and chronic kidney disease presents the ED today for left leg pain 1 week after a fall.  He reports that he was walking down his driveway and tripped, causing him to land on his left side.  He has pain and bruising to his left hip and left lateral thigh as well as swelling to his entire left lower extremity.  He is able to ambulate as normal.  Patient states that he also hit his left side of his forehead.  He is on blood thinners but denies loss of consciousness, vision changes, weakness, or changes in speech. He has not taken any medication for pain.  No other complaints or concerns at this time.    Home Medications Prior to Admission medications   Medication Sig Start Date End Date Taking? Authorizing Provider  Carboxymethylcellulose Sodium (LUBRICANT EYE DROPS OP) Place 1 drop into both eyes daily as needed (dry eyes).    [provider]  ELIQUIS 5 MG TABS tablet TAKE 1 TABLET(5 MG) BY MOUTH TWICE DAILY 02/21/23   Tonny Bollman, MD  ezetimibe (ZETIA) 10 MG tablet Take 1 tablet (10 mg total) by mouth daily. 09/20/22   Sharlene Dory, PA-C      Allergies    Aspirin    Review of Systems   Review of Systems  Musculoskeletal:        Left leg pain  All other systems reviewed and are negative.   Physical Exam Updated Vital Signs BP (!) 155/78 (BP Location: Right Arm)   Pulse 73   Temp (!) 97.5 F (36.4 C) (Oral)   Resp 18   SpO2 94%  Physical Exam Vitals and nursing note reviewed.  Constitutional:      General: He is not in acute distress.    Appearance: Normal appearance.  HENT:     Head: Normocephalic and atraumatic.     Comments: No raccoon sign or battle sign.  Slight yellow, non-tender bruise above left eyebrow.    Mouth/Throat:     Mouth: Mucous membranes are moist.  Eyes:     Conjunctiva/sclera: Conjunctivae normal.     Pupils: Pupils are equal, round, and reactive to light.  Cardiovascular:     Rate and Rhythm: Normal rate and regular rhythm.     Pulses: Normal pulses.     Heart sounds: Normal heart sounds.  Pulmonary:     Effort: Pulmonary effort is normal.     Breath sounds: Normal breath sounds.  Abdominal:     Palpations: Abdomen is soft.     Tenderness: There is no abdominal tenderness.  Musculoskeletal:     Comments: Swelling present at left lower extremity with tenderness only to the lateral thigh and left medial knee where the bruising is present.  Patient maintains full range of motion of the extremity.  Strength and sensation intact bilaterally.  Palpable dorsalis pedis pulses bilaterally.  Skin:    General: Skin is warm and dry.     Findings: Bruising present. No rash.     Comments: Purple/yellow hematoma at lateral left thigh. Yellow bruising above left eyebrow and at left medial knee.  Neurological:     General: No  focal deficit present.     Mental Status: He is alert.     Sensory: No sensory deficit.     Motor: No weakness.     Gait: Gait normal.  Psychiatric:        Mood and Affect: Mood normal.        Behavior: Behavior normal.     ED Results / Procedures / Treatments   Labs (all labs ordered are listed, but only abnormal results are displayed) Labs Reviewed - No data to display  EKG None  Radiology DG Femur Min 2 Views Left  Result Date: 03/26/2023 CLINICAL DATA:  Left leg pain since a fall 1 week ago. EXAM: LEFT FEMUR 2 VIEWS COMPARISON:  Left hip radiographs obtained at the same time. FINDINGS: Under penetrated soft tissues with no gross fracture or dislocation seen. Atheromatous arterial calcifications and right pelvic embolization coils. IMPRESSION: Under penetrated soft tissues with no gross fracture or  dislocation seen. Electronically Signed   By: Beckie Salts M.D.   On: 03/26/2023 17:02   DG Hip Unilat W or Wo Pelvis 2-3 Views Left  Result Date: 03/26/2023 CLINICAL DATA:  Left leg pain since a fall 1 week ago. EXAM: DG HIP (WITH OR WITHOUT PELVIS) 2-3V LEFT COMPARISON:  Left femur radiographs obtained at the same time. FINDINGS: Underpenetrated soft tissues with a grossly normal-appearing left hip with no fracture or dislocation seen. Arterial calcifications, embolization coils and stents. IMPRESSION: No fracture or dislocation. Electronically Signed   By: Beckie Salts M.D.   On: 03/26/2023 17:01   VAS [place] LOWER EXTREMITY VENOUS (DVT) (ONLY [facility] & [facility])  Result Date: 03/26/2023  Lower Venous DVT Study Patient Name:  [name]  Date of Exam:   03/26/2023 Medical Rec #: [id number]     Accession #:    [id number] Date of Birth: 29-Sep-1934     Patient Gender: M Patient Age:   24 years Exam Location:  [facility] Procedure:      VAS [place] LOWER EXTREMITY VENOUS (DVT) Referring Phys: Maxwell Marion --------------------------------------------------------------------------------  Indications: Swelling.  Risk Factors: Trauma. Limitations: Poor ultrasound/tissue interface. Comparison Study: No prior studies. Performing Technologist: Chanda Busing RVT  Examination Guidelines: A complete evaluation includes B-mode imaging, spectral Doppler, color Doppler, and power Doppler as needed of all accessible portions of each vessel. Bilateral testing is considered an integral part of a complete examination. Limited examinations for reoccurring indications may be performed as noted. The reflux portion of the exam is performed with the patient in reverse Trendelenburg.  +-----+---------------+---------+-----------+----------+--------------+ RIGHTCompressibilityPhasicitySpontaneityPropertiesThrombus Aging +-----+---------------+---------+-----------+----------+--------------+ CFV  Full           Yes      Yes                                  +-----+---------------+---------+-----------+----------+--------------+   +---------+---------------+---------+-----------+----------+--------------+ LEFT     CompressibilityPhasicitySpontaneityPropertiesThrombus Aging +---------+---------------+---------+-----------+----------+--------------+ CFV      Full           Yes      Yes                                 +---------+---------------+---------+-----------+----------+--------------+ SFJ      Full                                                        +---------+---------------+---------+-----------+----------+--------------+  FV Prox  Full                                                        +---------+---------------+---------+-----------+----------+--------------+ FV Mid   Full                                                        +---------+---------------+---------+-----------+----------+--------------+ FV DistalFull                                                        +---------+---------------+---------+-----------+----------+--------------+ PFV      Full                                                        +---------+---------------+---------+-----------+----------+--------------+ POP      Full           Yes      Yes                                 +---------+---------------+---------+-----------+----------+--------------+ PTV      Full                                                        +---------+---------------+---------+-----------+----------+--------------+ PERO     Full                                                        +---------+---------------+---------+-----------+----------+--------------+    Summary: RIGHT: - No evidence of common femoral vein obstruction.   LEFT: - There is no evidence of deep vein thrombosis in the lower extremity.  - No cystic structure found in the popliteal fossa.  *See table(s) above for measurements and  observations.    Preliminary     Procedures Procedures: not indicated.   Medications Ordered in ED Medications  acetaminophen (TYLENOL) tablet 650 mg (650 mg Oral Given 03/26/23 1715)    ED Course/ Medical Decision Making/ A&P                                 Medical Decision Making  This patient presents to the ED for concern of leg pain s/p fall, this involves an extensive number of treatment options, and is a complaint that carries with it a high risk of complications and morbidity.   Differential diagnosis includes: fracture, dislocation, contusion, muscle strain, DVT, etc.   Comorbidities  See HPI above   Additional History  Additional history obtained from previous records.   Imaging Studies  I ordered imaging studies including left femur and left hip/pelvis x-rays as well as left lower extremity US  I independently visualized and interpreted imaging which showed:   Grossly normal-appearing left hip with no fracture or dislocation seen. No fracture or dislocation of the left femur. Ultrasound negative for DVT. I agree with the radiologist interpretation   Problem List / ED Course / Critical Interventions / Medication Management  Left leg pain after fall I ordered medications including: Tylenol for pain.  Medication given prior to discharge. I have reviewed the patients home medicines and have made adjustments as needed.   Social Determinants of Health  Housing   Test / Admission - Considered  Discussed results with patient.  All questions were answered. Patient is hemodynamically stable and safe for discharge home. Return precautions provided.       Final Clinical Impression(s) / ED Diagnoses Final diagnoses:  Fall, initial encounter  Hematoma    Rx / DC Orders ED Discharge Orders     None         Maxwell Marion, PA-C 03/26/23 1735    Pricilla Loveless, MD 03/26/23 1843

## 2023-03-26 NOTE — ED Notes (Signed)
2nd

## 2023-03-26 NOTE — Progress Notes (Signed)
Left lower extremity venous duplex has been completed. Preliminary results can be found in CV Proc through chart review.  Results were given to Maxwell Marion PA.  03/26/23 4:57 PM Olen Cordial RVT

## 2023-03-26 NOTE — Discharge Instructions (Signed)
As discussed, your imaging does not show any fractures or dislocations.  You can take Tylenol as needed for your pain.  Please follow-up with your PCP in the next 3 to 5 days for reevaluation of your symptoms.  Get help right away if you: Lose consciousness or have trouble moving after a fall. Have a fall that causes a head injury. Your pain worsens and you're unable to bear weight on your leg.

## 2023-07-26 ENCOUNTER — Other Ambulatory Visit: Payer: Self-pay | Admitting: Physician Assistant

## 2023-08-08 ENCOUNTER — Other Ambulatory Visit: Payer: Self-pay | Admitting: Physician Assistant

## 2023-08-29 ENCOUNTER — Emergency Department (HOSPITAL_COMMUNITY): Payer: Medicare Other

## 2023-08-29 ENCOUNTER — Encounter (HOSPITAL_COMMUNITY): Payer: Self-pay

## 2023-08-29 ENCOUNTER — Other Ambulatory Visit: Payer: Self-pay

## 2023-08-29 ENCOUNTER — Inpatient Hospital Stay (HOSPITAL_COMMUNITY)
Admission: EM | Admit: 2023-08-29 | Discharge: 2023-08-31 | DRG: 291 | Disposition: A | Discharge status: Home / Self Care | Payer: Medicare Other | Attending: Internal Medicine | Admitting: Internal Medicine

## 2023-08-29 DIAGNOSIS — R0600 Dyspnea, unspecified: Secondary | ICD-10-CM | POA: Diagnosis not present

## 2023-08-29 DIAGNOSIS — Z7901 Long term (current) use of anticoagulants: Secondary | ICD-10-CM

## 2023-08-29 DIAGNOSIS — I160 Hypertensive urgency: Secondary | ICD-10-CM | POA: Diagnosis present

## 2023-08-29 DIAGNOSIS — I5033 Acute on chronic diastolic (congestive) heart failure: Secondary | ICD-10-CM | POA: Diagnosis present

## 2023-08-29 DIAGNOSIS — Z6833 Body mass index (BMI) 33.0-33.9, adult: Secondary | ICD-10-CM

## 2023-08-29 DIAGNOSIS — Z886 Allergy status to analgesic agent status: Secondary | ICD-10-CM

## 2023-08-29 DIAGNOSIS — Z8546 Personal history of malignant neoplasm of prostate: Secondary | ICD-10-CM

## 2023-08-29 DIAGNOSIS — Z1152 Encounter for screening for COVID-19: Secondary | ICD-10-CM

## 2023-08-29 DIAGNOSIS — Z79899 Other long term (current) drug therapy: Secondary | ICD-10-CM

## 2023-08-29 DIAGNOSIS — I10 Essential (primary) hypertension: Principal | ICD-10-CM

## 2023-08-29 DIAGNOSIS — I2510 Atherosclerotic heart disease of native coronary artery without angina pectoris: Secondary | ICD-10-CM | POA: Diagnosis present

## 2023-08-29 DIAGNOSIS — Z87891 Personal history of nicotine dependence: Secondary | ICD-10-CM

## 2023-08-29 DIAGNOSIS — I110 Hypertensive heart disease with heart failure: Principal | ICD-10-CM | POA: Diagnosis present

## 2023-08-29 DIAGNOSIS — I272 Pulmonary hypertension, unspecified: Secondary | ICD-10-CM | POA: Diagnosis present

## 2023-08-29 DIAGNOSIS — R0609 Other forms of dyspnea: Secondary | ICD-10-CM

## 2023-08-29 DIAGNOSIS — Z800 Family history of malignant neoplasm of digestive organs: Secondary | ICD-10-CM

## 2023-08-29 DIAGNOSIS — E785 Hyperlipidemia, unspecified: Secondary | ICD-10-CM | POA: Diagnosis present

## 2023-08-29 DIAGNOSIS — I480 Paroxysmal atrial fibrillation: Secondary | ICD-10-CM | POA: Diagnosis present

## 2023-08-29 DIAGNOSIS — E669 Obesity, unspecified: Secondary | ICD-10-CM | POA: Diagnosis present

## 2023-08-29 LAB — RESP PANEL BY RT-PCR (RSV, FLU A&B, COVID)  RVPGX2
Influenza A by PCR: NEGATIVE
Influenza B by PCR: NEGATIVE
Resp Syncytial Virus by PCR: NEGATIVE
SARS Coronavirus 2 by RT PCR: NEGATIVE

## 2023-08-29 NOTE — ED Triage Notes (Signed)
 Sob over the week that is getting worse. Pt reports it is worse when he exerts himself. Denies pain.

## 2023-08-29 NOTE — ED Notes (Signed)
 Patient transported to X-ray

## 2023-08-30 ENCOUNTER — Inpatient Hospital Stay (HOSPITAL_COMMUNITY): Payer: Medicare Other

## 2023-08-30 DIAGNOSIS — E785 Hyperlipidemia, unspecified: Secondary | ICD-10-CM | POA: Diagnosis present

## 2023-08-30 DIAGNOSIS — Z1152 Encounter for screening for COVID-19: Secondary | ICD-10-CM | POA: Diagnosis not present

## 2023-08-30 DIAGNOSIS — Z7901 Long term (current) use of anticoagulants: Secondary | ICD-10-CM | POA: Diagnosis not present

## 2023-08-30 DIAGNOSIS — Z886 Allergy status to analgesic agent status: Secondary | ICD-10-CM | POA: Diagnosis not present

## 2023-08-30 DIAGNOSIS — R0600 Dyspnea, unspecified: Secondary | ICD-10-CM | POA: Diagnosis present

## 2023-08-30 DIAGNOSIS — I160 Hypertensive urgency: Secondary | ICD-10-CM | POA: Diagnosis present

## 2023-08-30 DIAGNOSIS — Z6833 Body mass index (BMI) 33.0-33.9, adult: Secondary | ICD-10-CM | POA: Diagnosis not present

## 2023-08-30 DIAGNOSIS — I5031 Acute diastolic (congestive) heart failure: Secondary | ICD-10-CM

## 2023-08-30 DIAGNOSIS — I480 Paroxysmal atrial fibrillation: Secondary | ICD-10-CM | POA: Diagnosis present

## 2023-08-30 DIAGNOSIS — Z79899 Other long term (current) drug therapy: Secondary | ICD-10-CM | POA: Diagnosis not present

## 2023-08-30 DIAGNOSIS — Z8546 Personal history of malignant neoplasm of prostate: Secondary | ICD-10-CM | POA: Diagnosis not present

## 2023-08-30 DIAGNOSIS — E669 Obesity, unspecified: Secondary | ICD-10-CM | POA: Diagnosis present

## 2023-08-30 DIAGNOSIS — Z87891 Personal history of nicotine dependence: Secondary | ICD-10-CM | POA: Diagnosis not present

## 2023-08-30 DIAGNOSIS — Z800 Family history of malignant neoplasm of digestive organs: Secondary | ICD-10-CM | POA: Diagnosis not present

## 2023-08-30 DIAGNOSIS — I110 Hypertensive heart disease with heart failure: Secondary | ICD-10-CM | POA: Diagnosis present

## 2023-08-30 DIAGNOSIS — I272 Pulmonary hypertension, unspecified: Secondary | ICD-10-CM | POA: Diagnosis present

## 2023-08-30 DIAGNOSIS — I5033 Acute on chronic diastolic (congestive) heart failure: Secondary | ICD-10-CM | POA: Diagnosis present

## 2023-08-30 DIAGNOSIS — I2510 Atherosclerotic heart disease of native coronary artery without angina pectoris: Secondary | ICD-10-CM | POA: Diagnosis present

## 2023-08-30 LAB — ECHOCARDIOGRAM COMPLETE
AR max vel: 2.15 cm2
AV Area VTI: 2.26 cm2
AV Area mean vel: 1.92 cm2
AV Mean grad: 4 mm[Hg]
AV Peak grad: 6.7 mm[Hg]
Ao pk vel: 1.29 m/s
Area-P 1/2: 3.08 cm2
Calc EF: 58.1 %
Height: 72 in
MV M vel: 4.71 m/s
MV Peak grad: 88.5 mm[Hg]
MV VTI: 2.9 cm2
P 1/2 time: 812 ms
S' Lateral: 4 cm
Single Plane A2C EF: 61.3 %
Single Plane A4C EF: 55.8 %
Weight: 3920 [oz_av]

## 2023-08-30 LAB — CBC
HCT: 41.5 % (ref 39.0–52.0)
Hemoglobin: 12.9 g/dL — ABNORMAL LOW (ref 13.0–17.0)
MCH: 29.3 pg (ref 26.0–34.0)
MCHC: 31.1 g/dL (ref 30.0–36.0)
MCV: 94.1 fL (ref 80.0–100.0)
Platelets: 194 10*3/uL (ref 150–400)
RBC: 4.41 MIL/uL (ref 4.22–5.81)
RDW: 13.9 % (ref 11.5–15.5)
WBC: 6.3 10*3/uL (ref 4.0–10.5)
nRBC: 0 % (ref 0.0–0.2)

## 2023-08-30 LAB — TROPONIN I (HIGH SENSITIVITY)
Troponin I (High Sensitivity): 42 ng/L — ABNORMAL HIGH (ref <18)
Troponin I (High Sensitivity): 43 ng/L — ABNORMAL HIGH (ref <18)

## 2023-08-30 LAB — COMPREHENSIVE METABOLIC PANEL
ALT: 35 U/L (ref 0–44)
AST: 23 U/L (ref 15–41)
Albumin: 3.8 g/dL (ref 3.5–5.0)
Alkaline Phosphatase: 80 U/L (ref 38–126)
Anion gap: 11 (ref 5–15)
BUN: 20 mg/dL (ref 8–23)
CO2: 23 mmol/L (ref 22–32)
Calcium: 8.8 mg/dL — ABNORMAL LOW (ref 8.9–10.3)
Chloride: 106 mmol/L (ref 98–111)
Creatinine, Ser: 0.9 mg/dL (ref 0.61–1.24)
GFR, Estimated: >60 mL/min (ref >60)
Glucose, Bld: 140 mg/dL — ABNORMAL HIGH (ref 70–99)
Potassium: 4 mmol/L (ref 3.5–5.1)
Sodium: 140 mmol/L (ref 135–145)
Total Bilirubin: 1.2 mg/dL (ref 0.0–1.2)
Total Protein: 7.4 g/dL (ref 6.5–8.1)

## 2023-08-30 LAB — URINALYSIS, ROUTINE W REFLEX MICROSCOPIC
Bacteria, UA: NONE SEEN
Bilirubin Urine: NEGATIVE
Glucose, UA: NEGATIVE mg/dL
Hgb urine dipstick: NEGATIVE
Ketones, ur: NEGATIVE mg/dL
Leukocytes,Ua: NEGATIVE
Nitrite: NEGATIVE
Protein, ur: 30 mg/dL — AB
Specific Gravity, Urine: 1.02 (ref 1.005–1.030)
pH: 5 (ref 5.0–8.0)

## 2023-08-30 LAB — I-STAT CHEM 8, ED
BUN: 20 mg/dL (ref 8–23)
Calcium, Ion: 1.11 mmol/L — ABNORMAL LOW (ref 1.15–1.40)
Chloride: 106 mmol/L (ref 98–111)
Creatinine, Ser: 1 mg/dL (ref 0.61–1.24)
Glucose, Bld: 137 mg/dL — ABNORMAL HIGH (ref 70–99)
HCT: 42 % (ref 39.0–52.0)
Hemoglobin: 14.3 g/dL (ref 13.0–17.0)
Potassium: 4 mmol/L (ref 3.5–5.1)
Sodium: 142 mmol/L (ref 135–145)
TCO2: 23 mmol/L (ref 22–32)

## 2023-08-30 LAB — BRAIN NATRIURETIC PEPTIDE: B Natriuretic Peptide: 417 pg/mL — ABNORMAL HIGH (ref 0.0–100.0)

## 2023-08-30 MED ORDER — EZETIMIBE 10 MG PO TABS
10.0000 mg | ORAL_TABLET | Freq: Every day | ORAL | Status: DC
  Administered 2023-08-30 – 2023-08-31 (×2): 10 mg via ORAL
  Filled 2023-08-30 (×2): qty 1

## 2023-08-30 MED ORDER — APIXABAN 5 MG PO TABS
5.0000 mg | ORAL_TABLET | Freq: Two times a day (BID) | ORAL | Status: DC
  Administered 2023-08-30 – 2023-08-31 (×3): 5 mg via ORAL
  Filled 2023-08-30 (×3): qty 1

## 2023-08-30 MED ORDER — POLYETHYLENE GLYCOL 3350 17 G PO PACK: 17.0000 g | PACK | Freq: Every day | ORAL | Status: DC | PRN

## 2023-08-30 MED ORDER — PROCHLORPERAZINE EDISYLATE 10 MG/2ML IJ SOLN: 5.0000 mg | Freq: Four times a day (QID) | INTRAMUSCULAR | Status: DC | PRN

## 2023-08-30 MED ORDER — FUROSEMIDE 10 MG/ML IJ SOLN
40.0000 mg | Freq: Two times a day (BID) | INTRAMUSCULAR | Status: AC
  Administered 2023-08-30 (×2): 40 mg via INTRAVENOUS
  Filled 2023-08-30 (×2): qty 4

## 2023-08-30 MED ORDER — ACETAMINOPHEN 325 MG PO TABS: 650.0000 mg | ORAL_TABLET | Freq: Four times a day (QID) | ORAL | Status: DC | PRN

## 2023-08-30 MED ORDER — TRAZODONE HCL 50 MG PO TABS
50.0000 mg | ORAL_TABLET | Freq: Every day | ORAL | Status: DC
  Administered 2023-08-30: 50 mg via ORAL
  Filled 2023-08-30: qty 1

## 2023-08-30 MED ORDER — CARVEDILOL 3.125 MG PO TABS
3.1250 mg | ORAL_TABLET | Freq: Two times a day (BID) | ORAL | Status: DC
  Administered 2023-08-30 – 2023-08-31 (×2): 3.125 mg via ORAL
  Filled 2023-08-30 (×2): qty 1

## 2023-08-30 MED ORDER — LABETALOL HCL 5 MG/ML IV SOLN: 5.0000 mg | INTRAVENOUS | Status: DC | PRN

## 2023-08-30 MED ORDER — MELATONIN 5 MG PO TABS: 5.0000 mg | ORAL_TABLET | Freq: Every evening | ORAL | Status: DC | PRN

## 2023-08-30 MED ORDER — FUROSEMIDE 10 MG/ML IJ SOLN
40.0000 mg | Freq: Once | INTRAMUSCULAR | Status: AC
  Administered 2023-08-30: 40 mg via INTRAVENOUS
  Filled 2023-08-30: qty 4

## 2023-08-30 MED ORDER — SACUBITRIL-VALSARTAN 24-26 MG PO TABS
1.0000 | ORAL_TABLET | Freq: Two times a day (BID) | ORAL | Status: DC
  Administered 2023-08-30 – 2023-08-31 (×3): 1 via ORAL
  Filled 2023-08-30 (×3): qty 1

## 2023-08-30 NOTE — ED Notes (Signed)
 Patient report given to Shanda Bumps, RN Charge nurse - Primary RN has looked over patient's chart.

## 2023-08-30 NOTE — Progress Notes (Signed)
 Brief same day note:  Patient is 89 year old male with history of HFpEF, paroxysmal A-fib on Eliquis, coronary artery disease, ascending aortic dilatation, obesity who presented to emergency department from home with complaint of shortness of breath with minimal exertion.  He ran out of his Entresto 2 weeks ago.  Also reported bilateral lower extremity pitting edema.  Patient was tachypneic, severely hypertensive, appeared volume overloaded.  BNP was found to be elevated.  Chest x-ray showed cardiomegaly with mild pulmonary edema, elevated JVD.  Started on IV Lasix.   Patient seen and examined at bedside today.  During my evaluation, he looked comfortable.  On room air.  Still has trace bilateral lower EXTR edema.  Denies any chest pain or shortness of breath during my evaluation.  We discussed about continuing IV Lasix for today.    Assessment and plan:  Dyspnea secondary to acute on chronic HFpEF: Presented with dyspnea on minimal exertion, lower extremity swelling.  Elevated BNP.  Appeared to be volume overloaded on exam as well as chest x-ray.  Currently on Lasix IV 40 mg twice daily.  Last echo had shown EF of 55 to 60%, grade 1 diastolic function.  New echo has been ordered.  Continue to monitor daily input/output, weight  Hypertensive urgency: Ran out of Entresto few weeks ago.  Restarted.  Continue to monitor blood pressure.  Continue as needed medication for severe hypertension.  Blood pressure better now  Paroxysmal A-fib: Currently normal sinus rhythm. Not on any rate controlling agents at home.  Currently on Eliquis for anticoagulation  Hyperlipidemia: On Zetia  History of coronary artery disease: No anginal symptoms.  Troponins mildly elevated with flat trend.  History of pulmonary hypertension: Follows with cardiology  Dilated ascending aorta: As per echo on 5/22, it measures about 43 mm.  Outpatient follow-up with vascular surgery recommended  Physical debility: PT/OT  consulted

## 2023-08-30 NOTE — ED Notes (Signed)
 Patient taken to bathroom via wheelchair he was able to ambulate to wheelchair without any issues. His linen was changed and gown changed. Patient moved up in bed and resting comfortably.

## 2023-08-30 NOTE — ED Provider Notes (Signed)
 [place] EMERGENCY DEPARTMENT AT [facility] Provider Note   CSN: [id number] Arrival date & time: 08/29/23  1115     History  Chief Complaint  Patient presents with   Shortness of Breath    [name] is a 89 y.o. male with a past medical history of chronic combined systolic diastolic heart failure with EF 50-60 as of April 2024, CKD, pulmonary hypertension, paroxysmal atrial fibrillation on Eliquis twice daily, CAD, DM, abdominal and iliac artery aneurysm, prostate cancer, and history of aortic root dilatation status post endovascular stent graft who presents to the emergency department with fatigue and shortness of breath.  Patient reports that he has been off his Entresto for about 2 weeks.  States that when he ran out he called the pharmacy and he was told that he will "completed his course and did not need anymore."  Since that time patient has had progressively worsening shortness of breath over the last week, he states that he has to go up stairs in his house and can barely get to the top without gasping for air.  He reports that he used to be on Lasix but states that he does not think he is taking it anymore.  He has chronic bilateral peripheral edema and states that it is not significantly changed from previous.  Patient notably hypertensive.  Patient also notably became very dyspneic when transitioning from chair to examining gurney.  He denies chest pain, nausea, vomiting.   Shortness of Breath      Home Medications Prior to Admission medications   Medication Sig Start Date End Date Taking? Authorizing Provider  Carboxymethylcellulose Sodium (LUBRICANT EYE DROPS OP) Place 1 drop into both eyes daily as needed (dry eyes).    [provider]  ELIQUIS 5 MG TABS tablet TAKE 1 TABLET(5 MG) BY MOUTH TWICE DAILY 02/21/23   Tonny Bollman, MD  ezetimibe (ZETIA) 10 MG tablet TAKE 1 TABLET(10 MG) BY MOUTH DAILY 07/26/23   Tonny Bollman, MD      Allergies     Aspirin    Review of Systems   Review of Systems  Respiratory:  Positive for shortness of breath.     Physical Exam Updated Vital Signs BP (!) 229/142   Pulse 74   Temp 98.4 F (36.9 C)   Resp 19   Ht 6' (1.829 m)   Wt 111.1 kg   SpO2 94%   BMI 33.23 kg/m  Physical Exam Vitals and nursing note reviewed.  Constitutional:      General: He is not in acute distress.    Appearance: He is well-developed. He is not diaphoretic.  HENT:     Head: Normocephalic and atraumatic.  Eyes:     General: No scleral icterus.    Conjunctiva/sclera: Conjunctivae normal.  Cardiovascular:     Rate and Rhythm: Normal rate and regular rhythm.     Heart sounds: Normal heart sounds.  Pulmonary:     Effort: Tachypnea present. No respiratory distress.     Breath sounds: Normal breath sounds. No decreased breath sounds, wheezing or rales.  Abdominal:     Palpations: Abdomen is soft.     Tenderness: There is no abdominal tenderness.  Musculoskeletal:     Cervical back: Normal range of motion and neck supple.     Right lower leg: Edema present.     Left lower leg: Edema present.  Skin:    General: Skin is warm and dry.  Neurological:     Mental  Status: He is alert.  Psychiatric:        Behavior: Behavior normal.     ED Results / Procedures / Treatments   Labs (all labs ordered are listed, but only abnormal results are displayed) Labs Reviewed  RESP PANEL BY RT-PCR (RSV, FLU A&B, COVID)  RVPGX2  BRAIN NATRIURETIC PEPTIDE  URINALYSIS, ROUTINE W REFLEX MICROSCOPIC  COMPREHENSIVE METABOLIC PANEL  TROPONIN I (HIGH SENSITIVITY)    EKG None  Radiology DG Chest Port 1 View Result Date: 08/29/2023 CLINICAL DATA:  Shortness of breath and cough. EXAM: PORTABLE CHEST 1 VIEW COMPARISON:  Chest radiograph dated 04/22/2019. FINDINGS: No focal consolidation, pleural effusion, or pneumothorax. Mild cardiomegaly. No acute osseous pathology. IMPRESSION: 1. No active disease. 2. Mild cardiomegaly.  Electronically Signed   By: Elgie Collard M.D.   On: 08/29/2023 15:08    Procedures Procedures    Medications Ordered in ED Medications - No data to display  ED Course/ Medical Decision Making/ A&P Clinical Course as of 08/30/23 0254  Tue Aug 30, 2023  0154 Glucose(!): 137 [AH]  0158 Troponin I (High Sensitivity)(!): 42 Troponin elevated at 42.  Chronic and baseline [AH]    Clinical Course User Index [AH] Arthor Captain, PA-C                                 Medical Decision Making Amount and/or Complexity of Data Reviewed Labs: ordered. Decision-making details documented in ED Course.  Risk Prescription drug management. Decision regarding hospitalization.   This patient presents to the ED with chief complaint(s) of shortness of breath and fatigue with pertinent past medical history of  has a past medical history of (HFimpEF) heart failure with improved EF, A-fib (HCC), Abnormal PSA (01/17/2012), Aneurysm (HCC), Aortic insufficiency (10/01/2021), Ascending aorta dilation (HCC) (09/16/2021), Blood transfusion without reported diagnosis, Cancer (HCC) (2006), Chronic kidney disease, Coronary artery disease, ED (erectile dysfunction), GERD (gastroesophageal reflux disease), Gynecomastia (04/21/2011), H/O impacted cerumen, History of echocardiogram, cardiovascular stress test, Hydroureteronephrosis, Hyperglycemia, Hyperlipidemia, Hypertension, Insomnia, Migraines, Mitral regurgitation (08/10/2018), Nephrolithiasis, Osteopenia, Overweight, Prostate cancer (HCC) (12/09/03 dx), Prostate cancer (HCC), RBBB (right bundle branch block), Rosacea, Sinus bradycardia, Sleep apnea, Stroke (HCC), Ulcer, and Use of leuprolide acetate (Lupron) (06/2005-11/2006).  which further complicates the presenting complaint. The complaint involves an extensive differential diagnosis and treatment options and also carries with it a high risk of complications and morbidity.    The emergent differential diagnosis  for shortness of breath includes, but is not limited to, Pulmonary edema, bronchoconstriction, Pneumonia, Pulmonary embolism, Pneumotherax/ Hemothorax, Dysrythmia, ACS.      Records reviewed previous admission documents, PCP outside records  Reassessment and review (also see workup area): Lab Tests: I Ordered, and personally interpreted labs.  The pertinent results include: Troponin elevated appears to be patient's baseline, urine does not appear infected.  CMP with slightly elevated blood glucose of insignificant value, BNP elevated at 417, respiratory panel negative.  Imaging Studies: I ordered and independently visualized and interpreted the following imaging X-ray chest   which showed no acute findings The interpretation of the imaging was limited to assessing for emergent pathology, for which purpose it was ordered.  Consultations Obtained: I requested consultation with the admitting physician Dr. Margo Aye , and discussed  findings as well as pertinent plan - they recommend: Admission  Social Determinants of Health: Patient's  lives alone   increases the complexity of managing their presentation  Cardiac Monitoring: The patient  was maintained on a cardiac monitor.  I personally viewed and interpreted the cardiac monitor which showed an underlying rhythm of:  sinus rhythm  Complexity of problems addressed: Patient's presentation is most consistent with  severe exacerbation of chronic illness During patient's assessment  Disposition: After consideration of the diagnostic results and the patient's response to treatment,  I feel that the patent would benefit from admission   .  Suspect the patient is having a worsening of his pulmonary arterial hypertension in the setting of uncontrolled hypertension as he has been off of his Entresto for the past 2 weeks.  Lasix ordered for diuresis and blood pressure control.  Will need admission        Final Clinical Impression(s) / ED  Diagnoses Final diagnoses:  Accelerated hypertension  Exertional dyspnea    Rx / DC Orders ED Discharge Orders     None         Arthor Captain, PA-C 08/30/23 6578    Nira Conn, MD 08/30/23 [phone]

## 2023-08-30 NOTE — Progress Notes (Signed)
  Echocardiogram 2D Echocardiogram has been performed.  Ocie Doyne RDCS 08/30/2023, 12:12 PM

## 2023-08-30 NOTE — H&P (Addendum)
 History and Physical  [name] ZOX:[id number] DOB: 1934-08-01 DOA: 08/29/2023  Referring physician: Burnis Medin  PCP: [name], MD  Outpatient Specialists: Cardiology. Patient coming from: Home.  Chief Complaint: Shortness of breath.  HPI: [name] is a 89 y.o. male with medical history significant for chronic HFpEF, paroxysmal A-fib on Eliquis, coronary artery disease, ascending aortic dilatation, obesity, who presents to the ER due to worsening shortness of breath with minimal exertion.  Endorses he ran out of his home Entresto 2 weeks ago.  His symptoms are associated with worsening bilateral lower extremity pitting edema.  Denies any chest pain.  In the ER, tachypneic and severely hypertensive.  Volume overload on exam.  BNP elevated greater than 400.  Cardiomegaly with mild pulmonary edema on chest x-ray, right JVD.  Received a dose of IV Lasix 40 mg x 1 due to concern for acute on chronic HFpEF.  EDP requested admission.  Admitted by [facility], hospitalist service.  ED Course: Temperature 98.7.  BP 163/100, pulse 67, respiratory 23, O2 saturation 92% on room air.  Review of Systems: Review of systems as noted in the HPI. All other systems reviewed and are negative.   Past Medical History:  Diagnosis Date   (HFimpEF) heart failure with improved EF    Echo 04/2019: EF 35-40 // Echo 5/22: EF 55-60, no RWMA, GR 1 DD, moderate to severe MR, myxomatous mitral valve, similar to prior echocardiogram, mildly elevated PASP, RVSP 35.9, mild AI, mild AV sclerosis without AS, mild dilation of ascending aorta (43 mm)   A-fib (HCC)    RVR-DCCV   Abnormal PSA 01/17/2012   10.38/4/5/ PSA:2013=6.96/ PSA 07/08/2011=6.26/ PSA 2006=8.6   Aneurysm (HCC)    MILD 2.7 CM FUSIFORM llIAC ANEURYSM   Aortic insufficiency 10/01/2021   Echocardiogram March 2023: mild to mod AI   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/16/2021   Echocardiogram March 2023: 41 mm   Blood transfusion without reported diagnosis     during vagotomy 1976   Cancer (HCC) 2006   L-2 solitary metastatic lesion/no rad tx ,asymptomatic   Chronic kidney disease    nephrolithiasis right kidney   Coronary artery disease    ED (erectile dysfunction)    GERD (gastroesophageal reflux disease)    PUD S/P PARTIAL VAGOTOMY   Gynecomastia 04/21/2011   seen by Dr.Murray, no rad tx   H/O impacted cerumen    S/P ENT DR BATES   History of echocardiogram    echo 4/16:  mild LVH, EF 55-60%, Gr 1 DD, no RWMA, mild AI, mild MR, mod LAE, mild TR   Hx of cardiovascular stress test    Myoview 5/16:  EF 50%, no scar or ischemia, low risk   Hydroureteronephrosis    right    Hyperglycemia    WITHOUT DM2   Hyperlipidemia    Hypertension    Insomnia    Migraines    WITH AURA   Mitral regurgitation 08/10/2018   Echo 11/04/20: EF 55-60, no RWMA, Gr 1 DD, mod to severe MR, normal RVSF, RVSP 35.9, mild LAE, mild AI, AV sclerosis w/o AS, ascending aorta 43 mm (mild dilation) Echo 3/23: EF 60-65, no RWMA, mild LVH, GR 1 DD, normal RVSF, mildly elevated PASP, moderate LAE, moderate MR, mild to moderate AI, dilated ascending aorta (41 mm), dilated aortic root (40 mm), RVSP 36.4   Nephrolithiasis    Osteopenia    Overweight    Prostate cancer (HCC) 12/09/03 dx   Prostate ca/adenocarcinoma,gleason=3+3=6    pSA  4.6   Prostate cancer (HCC)    METASTATIC WITH L2 SCLEROTIC LESION ON LUPRON------DR. WOODRUFF   RBBB (right bundle branch block)    Rosacea    Sinus bradycardia    Sleep apnea    pt unaware, no sleep study   Stroke [facility])    asymptomatic, found on head imaging   Ulcer    peptic   Use of leuprolide acetate (Lupron) 06/2005-11/2006   02/2008 degarelix 02/2008 x 3 doses   Past Surgical History:  Procedure Laterality Date   ABDOMINAL AORTIC ENDOVASCULAR STENT GRAFT Right 10/28/2021   Procedure: ABDOMINAL AORTIC ENDOVASCULAR GRAFT WITH RIGHT EPIGASTRIC ARTERY COILING;  Surgeon: Leonie Douglas, MD;  Location: [facility] OR;  Service: Vascular;   Laterality: Right;   CARDIOVERSION N/A 06/19/2018   Procedure: CARDIOVERSION;  Surgeon: Laurey Morale, MD;  Location: [facility] ENDOSCOPY;  Service: Cardiovascular;  Laterality: N/A;   PROSTATE BIOPSY  12/09/03   adenocarcinoma,glerason: 3+3=6   RIGHT/LEFT HEART CATH AND CORONARY ANGIOGRAPHY N/A 04/25/2019   Procedure: RIGHT/LEFT HEART CATH AND CORONARY ANGIOGRAPHY;  Surgeon: Marykay Lex, MD;  Location: [facility] INVASIVE CV LAB;  Service: Cardiovascular;  Laterality: N/A;   TEE WITHOUT CARDIOVERSION N/A 06/19/2018   Procedure: TRANSESOPHAGEAL ECHOCARDIOGRAM (TEE);  Surgeon: Laurey Morale, MD;  Location: [facility] ENDOSCOPY;  Service: Cardiovascular;  Laterality: N/A;   VAGOTOMY  1978   partial    Social History:  reports that he quit smoking about 39 years ago. His smoking use included pipe. He has never used smokeless tobacco. He reports that he does not drink alcohol and does not use drugs.   Allergies  Allergen Reactions   Aspirin Other (See Comments)    History of "stomach ulcers," was told to not take this     Family History  Problem Relation Age of Onset   Liver cancer Father    Kidney Stones Father    Colon cancer Neg Hx    Stroke Neg Hx       Prior to Admission medications   Medication Sig Start Date End Date Taking? Authorizing Provider  Carboxymethylcellulose Sodium (LUBRICANT EYE DROPS OP) Place 1 drop into both eyes daily as needed (dry eyes).    [provider]  ELIQUIS 5 MG TABS tablet TAKE 1 TABLET(5 MG) BY MOUTH TWICE DAILY 02/21/23   Tonny Bollman, MD  ezetimibe (ZETIA) 10 MG tablet TAKE 1 TABLET(10 MG) BY MOUTH DAILY 07/26/23   Tonny Bollman, MD    Physical Exam: BP (!) 167/101   Pulse 71   Temp 98.7 F (37.1 C)   Resp (!) 23   Ht 6' (1.829 m)   Wt 111.1 kg   SpO2 92%   BMI 33.23 kg/m   General: 89 y.o. year-old male well developed well nourished in no acute distress.  Alert and oriented x3. Cardiovascular: Regular rate and rhythm with no rubs or  gallops.  No thyromegaly.  Right JVD noted.  2+ pitting edema in lower extremities bilaterally. Respiratory: Mild rales diffusely bilaterally.  Poor inspiratory effort. Abdomen: Soft nontender nondistended with normal bowel sounds x4 quadrants. Muskuloskeletal: No cyanosis or clubbing noted bilaterally Neuro: CN II-XII intact, strength, sensation, reflexes Skin: No ulcerative lesions noted or rashes Psychiatry: Judgement and insight appear normal. Mood is appropriate for condition and setting          Labs on Admission:  Basic Metabolic Panel: Recent Labs  Lab 08/30/23 0121 08/30/23 0128  NA 140 142  K 4.0 4.0  CL 106 106  CO2 23  --   GLUCOSE 140* 137*  BUN 20 20  CREATININE 0.90 1.00  CALCIUM 8.8*  --    Liver Function Tests: Recent Labs  Lab 08/30/23 0121  AST 23  ALT 35  ALKPHOS 80  BILITOT 1.2  PROT 7.4  ALBUMIN 3.8   No results for input(s): "LIPASE", "AMYLASE" in the last 168 hours. No results for input(s): "AMMONIA" in the last 168 hours. CBC: Recent Labs  Lab 08/30/23 0128  HGB 14.3  HCT 42.0   Cardiac Enzymes: No results for input(s): "CKTOTAL", "CKMB", "CKMBINDEX", "TROPONINI" in the last 168 hours.  BNP (last 3 results) Recent Labs    08/29/23 2347  BNP 417.0*    ProBNP (last 3 results) Recent Labs    09/20/22 1444  PROBNP 215    CBG: No results for input(s): "GLUCAP" in the last 168 hours.  Radiological Exams on Admission: DG Chest Port 1 View Result Date: 08/29/2023 CLINICAL DATA:  Shortness of breath and cough. EXAM: PORTABLE CHEST 1 VIEW COMPARISON:  Chest radiograph dated 04/22/2019. FINDINGS: No focal consolidation, pleural effusion, or pneumothorax. Mild cardiomegaly. No acute osseous pathology. IMPRESSION: 1. No active disease. 2. Mild cardiomegaly. Electronically Signed   By: Elgie Collard M.D.   On: 08/29/2023 15:08    EKG: I independently viewed the EKG done and my findings are as followed: Sinus rhythm rate of 91.   Nonspecific ST-T changes.  TC 484.  Assessment/Plan Present on Admission:  Dyspnea  Principal Problem:   Dyspnea  Worsening dyspnea likely secondary to pulmonary edema in the setting of acute on chronic HFpEF Last 2D echo done on 10/18/2022 revealed LVEF 55 to 60% with grade 1 diastolic dysfunction IV diuresis initiated in the ED, continue IV Lasix 40 mg twice daily, replete electrolytes as indicated. Follow repeat 2D echo Monitor strict I's and O's and daily weight. Consider cardiology consultation in the morning to assist with the management  Hypertensive urgency BP 180/112 on presentation Start oral antihypertensives, resume home Entresto As needed IV antihypertensives Closely monitor vital signs  Physical debility, likely contributed by his heart failure exacerbation PT OT assessment Fall precautions.  Paroxysmal A-fib on Eliquis, rate controlled Resume home Eliquis for CVA prevention The patient is not on any rate controlled agents Monitor on telemetry.  Hyperlipidemia Resume home Zetia Has discontinued rosuvastatin on his own.  Dilated ascending aorta seen on echo 11/2020, measuring 43 mm Coronary artery disease Pulmonary hypertension Follows with cardiology outpatient.   Critical care time: 55 minutes.   DVT prophylaxis: Home Eliquis  Code Status: Full code.  Family Communication: None at bedside.  Disposition Plan: Admitted to telemetry unit.  Consults called: None.  Admission status: Inpatient status.   Status is: Inpatient The patient requires at least 2 midnights for further evaluation and treatment of present condition.   Darlin Drop MD [facility] Pager [phone]  If 7PM-7AM, please contact night-coverage [email or web address] Password [facility]  08/30/2023, 3:44 AM

## 2023-08-30 NOTE — Progress Notes (Signed)
 OT Cancellation Note  Patient Details Name: [name] MRN: [id number] DOB: August 22, 1934   Cancelled Treatment:    Reason Eval/Treat Not Completed: Medical issues which prohibited therapy Patient is in bed with bp of 171/141 mmhg. OT to continue to follow and check back as schedule will allow.  Rosalio Loud, MS Acute Rehabilitation Department Office# [phone]  08/30/2023, 9:05 AM

## 2023-08-30 NOTE — ED Notes (Signed)
 ED TO INPATIENT HANDOFF REPORT  ED Nurse Name and Phone #: Marylene Land, RN   S Name/Age/Gender [name] 89 y.o. male Room/Bed: WA17/WA17  Code Status   Code Status: Full Code  Home/SNF/Other Home Patient oriented to: self, place, time, and situation Is this baseline? Yes   Triage Complete: Triage complete  Chief Complaint Dyspnea [R06.00]  Triage Note Sob over the week that is getting worse. Pt reports it is worse when he exerts himself. Denies pain.   Allergies Allergies  Allergen Reactions   Aspirin Other (See Comments)    History of "stomach ulcers," was told to not take this     Level of Care/Admitting Diagnosis ED Disposition     ED Disposition  Admit   Condition  --   Comment  Hospital Area: [facility] [[id number]]  Level of Care: Telemetry [5]  Admit to tele based on following criteria: Monitor for Ischemic changes  May admit patient to [facility] or [facility] if equivalent level of care is available:: Yes  Covid Evaluation: Asymptomatic - no recent exposure (last 10 days) testing not required  Diagnosis: Dyspnea [241871]  Admitting Physician: Darlin Drop [[id number]]  Attending Physician: Darlin Drop [[id number]]  Certification:: I certify this patient will need inpatient services for at least 2 midnights  Expected Medical Readiness: 09/01/2023          B Medical/Surgery History Past Medical History:  Diagnosis Date   (HFimpEF) heart failure with improved EF    Echo 04/2019: EF 35-40 // Echo 5/22: EF 55-60, no RWMA, GR 1 DD, moderate to severe MR, myxomatous mitral valve, similar to prior echocardiogram, mildly elevated PASP, RVSP 35.9, mild AI, mild AV sclerosis without AS, mild dilation of ascending aorta (43 mm)   A-fib (HCC)    RVR-DCCV   Abnormal PSA 01/17/2012   10.38/4/5/ PSA:2013=6.96/ PSA 07/08/2011=6.26/ PSA 2006=8.6   Aneurysm (HCC)    MILD 2.7 CM FUSIFORM llIAC ANEURYSM   Aortic insufficiency 10/01/2021    Echocardiogram March 2023: mild to mod AI   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/16/2021   Echocardiogram March 2023: 41 mm   Blood transfusion without reported diagnosis    during vagotomy 1976   Cancer (HCC) 2006   L-2 solitary metastatic lesion/no rad tx ,asymptomatic   Chronic kidney disease    nephrolithiasis right kidney   Coronary artery disease    ED (erectile dysfunction)    GERD (gastroesophageal reflux disease)    PUD S/P PARTIAL VAGOTOMY   Gynecomastia 04/21/2011   seen by Dr.Murray, no rad tx   H/O impacted cerumen    S/P ENT DR BATES   History of echocardiogram    echo 4/16:  mild LVH, EF 55-60%, Gr 1 DD, no RWMA, mild AI, mild MR, mod LAE, mild TR   Hx of cardiovascular stress test    Myoview 5/16:  EF 50%, no scar or ischemia, low risk   Hydroureteronephrosis    right    Hyperglycemia    WITHOUT DM2   Hyperlipidemia    Hypertension    Insomnia    Migraines    WITH AURA   Mitral regurgitation 08/10/2018   Echo 11/04/20: EF 55-60, no RWMA, Gr 1 DD, mod to severe MR, normal RVSF, RVSP 35.9, mild LAE, mild AI, AV sclerosis w/o AS, ascending aorta 43 mm (mild dilation) Echo 3/23: EF 60-65, no RWMA, mild LVH, GR 1 DD, normal RVSF, mildly elevated PASP, moderate LAE, moderate MR, mild to moderate AI,  dilated ascending aorta (41 mm), dilated aortic root (40 mm), RVSP 36.4   Nephrolithiasis    Osteopenia    Overweight    Prostate cancer (HCC) 12/09/03 dx   Prostate ca/adenocarcinoma,gleason=3+3=6    pSA 4.6   Prostate cancer (HCC)    METASTATIC WITH L2 SCLEROTIC LESION ON LUPRON------DR. WOODRUFF   RBBB (right bundle branch block)    Rosacea    Sinus bradycardia    Sleep apnea    pt unaware, no sleep study   Stroke [facility])    asymptomatic, found on head imaging   Ulcer    peptic   Use of leuprolide acetate (Lupron) 06/2005-11/2006   02/2008 degarelix 02/2008 x 3 doses   Past Surgical History:  Procedure Laterality Date   ABDOMINAL AORTIC ENDOVASCULAR STENT GRAFT Right  10/28/2021   Procedure: ABDOMINAL AORTIC ENDOVASCULAR GRAFT WITH RIGHT EPIGASTRIC ARTERY COILING;  Surgeon: Leonie Douglas, MD;  Location: [facility] OR;  Service: Vascular;  Laterality: Right;   CARDIOVERSION N/A 06/19/2018   Procedure: CARDIOVERSION;  Surgeon: Laurey Morale, MD;  Location: [facility] ENDOSCOPY;  Service: Cardiovascular;  Laterality: N/A;   PROSTATE BIOPSY  12/09/03   adenocarcinoma,glerason: 3+3=6   RIGHT/LEFT HEART CATH AND CORONARY ANGIOGRAPHY N/A 04/25/2019   Procedure: RIGHT/LEFT HEART CATH AND CORONARY ANGIOGRAPHY;  Surgeon: Marykay Lex, MD;  Location: [facility] INVASIVE CV LAB;  Service: Cardiovascular;  Laterality: N/A;   TEE WITHOUT CARDIOVERSION N/A 06/19/2018   Procedure: TRANSESOPHAGEAL ECHOCARDIOGRAM (TEE);  Surgeon: Laurey Morale, MD;  Location: [facility] ENDOSCOPY;  Service: Cardiovascular;  Laterality: N/A;   VAGOTOMY  1978   partial     A IV Location/Drains/Wounds Patient Lines/Drains/Airways Status     Active Line/Drains/Airways     Name Placement date Placement time Site Days   Peripheral IV 08/30/23 20 G 1" Left Antecubital 08/30/23  0418  Antecubital  less than 1            Intake/Output Last 24 hours  Intake/Output Summary (Last 24 hours) at 08/30/2023 1402 Last data filed at 08/30/2023 1057 Gross per 24 hour  Intake --  Output 3200 ml  Net -3200 ml    Labs/Imaging Results for orders placed or performed during the hospital encounter of 08/29/23 (from the past 48 hours)  Resp panel by RT-PCR (RSV, Flu A&B, Covid) Anterior Nasal Swab     Status: None   Collection Time: 08/29/23 12:45 PM   Specimen: Anterior Nasal Swab  Result Value Ref Range   SARS Coronavirus 2 by RT PCR NEGATIVE NEGATIVE    Comment: (NOTE) SARS-CoV-2 target nucleic acids are NOT DETECTED.  The SARS-CoV-2 RNA is generally detectable in upper respiratory specimens during the acute phase of infection. The lowest concentration of SARS-CoV-2 viral copies this assay can detect is 138  copies/mL. A negative result does not preclude SARS-Cov-2 infection and should not be used as the sole basis for treatment or other patient management decisions. A negative result may occur with  improper specimen collection/handling, submission of specimen other than nasopharyngeal swab, presence of viral mutation(s) within the areas targeted by this assay, and inadequate number of viral copies(<138 copies/mL). A negative result must be combined with clinical observations, patient history, and epidemiological information. The expected result is Negative.  Fact Sheet for Patients:  BloggerCourse.com  Fact Sheet for Healthcare Providers:  SeriousBroker.it  This test is no t yet approved or cleared by the [place] FDA and  has been authorized for detection and/or diagnosis of SARS-CoV-2 by FDA under  an Emergency Use Authorization (EUA). This EUA will remain  in effect (meaning this test can be used) for the duration of the COVID-19 declaration under Section 564(b)(1) of the Act, 21 U.S.C.section 360bbb-3(b)(1), unless the authorization is terminated  or revoked sooner.       Influenza A by PCR NEGATIVE NEGATIVE   Influenza B by PCR NEGATIVE NEGATIVE    Comment: (NOTE) The Xpert Xpress SARS-CoV-2/FLU/RSV plus assay is intended as an aid in the diagnosis of influenza from Nasopharyngeal swab specimens and should not be used as a sole basis for treatment. Nasal washings and aspirates are unacceptable for Xpert Xpress SARS-CoV-2/FLU/RSV testing.  Fact Sheet for Patients: BloggerCourse.com  Fact Sheet for Healthcare Providers: SeriousBroker.it  This test is not yet approved or cleared by the [place] FDA and has been authorized for detection and/or diagnosis of SARS-CoV-2 by FDA under an Emergency Use Authorization (EUA). This EUA will remain in effect (meaning this test  can be used) for the duration of the COVID-19 declaration under Section 564(b)(1) of the Act, 21 U.S.C. section 360bbb-3(b)(1), unless the authorization is terminated or revoked.     Resp Syncytial Virus by PCR NEGATIVE NEGATIVE    Comment: (NOTE) Fact Sheet for Patients: BloggerCourse.com  Fact Sheet for Healthcare Providers: SeriousBroker.it  This test is not yet approved or cleared by the [place] FDA and has been authorized for detection and/or diagnosis of SARS-CoV-2 by FDA under an Emergency Use Authorization (EUA). This EUA will remain in effect (meaning this test can be used) for the duration of the COVID-19 declaration under Section 564(b)(1) of the Act, 21 U.S.C. section 360bbb-3(b)(1), unless the authorization is terminated or revoked.  Performed at [facility], [address], [place], [place] [address]   Brain natriuretic peptide     Status: Abnormal   Collection Time: 08/29/23 11:47 PM  Result Value Ref Range   B Natriuretic Peptide 417.0 (H) 0.0 - 100.0 pg/mL    Comment: Performed at [facility], [address], [place], [place] [address]  Urinalysis, Routine w reflex microscopic -Urine, Clean Catch     Status: Abnormal   Collection Time: 08/30/23  1:00 AM  Result Value Ref Range   Color, Urine YELLOW YELLOW   APPearance HAZY (A) CLEAR   Specific Gravity, Urine 1.020 1.005 - 1.030   pH 5.0 5.0 - 8.0   Glucose, UA NEGATIVE NEGATIVE mg/dL   Hgb urine dipstick NEGATIVE NEGATIVE   Bilirubin Urine NEGATIVE NEGATIVE   Ketones, ur NEGATIVE NEGATIVE mg/dL   Protein, ur 30 (A) NEGATIVE mg/dL   Nitrite NEGATIVE NEGATIVE   Leukocytes,Ua NEGATIVE NEGATIVE   RBC / HPF 0-5 0 - 5 RBC/hpf   WBC, UA 0-5 0 - 5 WBC/hpf   Bacteria, UA NONE SEEN NONE SEEN   Squamous Epithelial / HPF 0-5 0 - 5 /HPF   Mucus PRESENT    Hyaline Casts, UA PRESENT     Comment: Performed at [facility], [address], [place], [place] [address]  Comprehensive metabolic panel     Status: Abnormal   Collection Time: 08/30/23  1:21 AM  Result Value Ref Range   Sodium 140 135 - 145 mmol/L   Potassium 4.0 3.5 - 5.1 mmol/L   Chloride 106 98 - 111 mmol/L   CO2 23 22 - 32 mmol/L   Glucose, Bld 140 (H) 70 - 99 mg/dL    Comment: Glucose reference range applies only to samples taken after  fasting for at least 8 hours.   BUN 20 8 - 23 mg/dL   Creatinine, Ser 1.61 0.61 - 1.24 mg/dL   Calcium 8.8 (L) 8.9 - 10.3 mg/dL   Total Protein 7.4 6.5 - 8.1 g/dL   Albumin 3.8 3.5 - 5.0 g/dL   AST 23 15 - 41 U/L   ALT 35 0 - 44 U/L   Alkaline Phosphatase 80 38 - 126 U/L   Total Bilirubin 1.2 0.0 - 1.2 mg/dL   GFR, Estimated >09 >60 mL/min    Comment: (NOTE) Calculated using the CKD-EPI Creatinine Equation (2021)    Anion gap 11 5 - 15    Comment: Performed at [facility], [address], [place], [place] [address]  Troponin I (High Sensitivity)     Status: Abnormal   Collection Time: 08/30/23  1:21 AM  Result Value Ref Range   Troponin I (High Sensitivity) 42 (H) <18 ng/L    Comment: (NOTE) Elevated high sensitivity troponin I (hsTnI) values and significant  changes across serial measurements may suggest ACS but many other  chronic and acute conditions are known to elevate hsTnI results.  Refer to the "Links" section for chest pain algorithms and additional  guidance. Performed at [facility], [address] [place], [place] [address]   I-stat chem 8, ED     Status: Abnormal   Collection Time: 08/30/23  1:28 AM  Result Value Ref Range   Sodium 142 135 - 145 mmol/L   Potassium 4.0 3.5 - 5.1 mmol/L   Chloride 106 98 - 111 mmol/L   BUN 20 8 - 23 mg/dL   Creatinine, Ser 4.78 0.61 - 1.24 mg/dL   Glucose, Bld 295 (H) 70 - 99 mg/dL    Comment: Glucose reference range applies only to samples taken after fasting for at least 8 hours.    Calcium, Ion 1.11 (L) 1.15 - 1.40 mmol/L   TCO2 23 22 - 32 mmol/L   Hemoglobin 14.3 13.0 - 17.0 g/dL   HCT 62.1 30.8 - 65.7 %  Troponin I (High Sensitivity)     Status: Abnormal   Collection Time: 08/30/23  4:21 AM  Result Value Ref Range   Troponin I (High Sensitivity) 43 (H) <18 ng/L    Comment: (NOTE) Elevated high sensitivity troponin I (hsTnI) values and significant  changes across serial measurements may suggest ACS but many other  chronic and acute conditions are known to elevate hsTnI results.  Refer to the "Links" section for chest pain algorithms and additional  guidance. Performed at [facility], [address] [place], [place] [address]   CBC     Status: Abnormal   Collection Time: 08/30/23  8:43 AM  Result Value Ref Range   WBC 6.3 4.0 - 10.5 K/uL   RBC 4.41 4.22 - 5.81 MIL/uL   Hemoglobin 12.9 (L) 13.0 - 17.0 g/dL   HCT 29.5 28.4 - 13.2 %   MCV 94.1 80.0 - 100.0 fL   MCH 29.3 26.0 - 34.0 pg   MCHC 31.1 30.0 - 36.0 g/dL   RDW 44.0 10.2 - 72.5 %   Platelets 194 150 - 400 K/uL   nRBC 0.0 0.0 - 0.2 %    Comment: Performed at [facility], [address] [place], [place] [address]   ECHOCARDIOGRAM COMPLETE Result Date: 08/30/2023    ECHOCARDIOGRAM REPORT   Patient Name:   [name] Date of Exam: 08/30/2023 Medical Rec #:  [id number]    Height:       72.0 in Accession #:    [id number]   Weight:       245.0 lb Date of Birth:  09-Sep-1934    BSA:          2.322 m Patient Age:    88 years     BP:           157/89 mmHg Patient Gender: M            HR:           77 bpm. Exam Location:  Inpatient Procedure: 2D Echo, Cardiac Doppler and Color Doppler (Both Spectral and Color            Flow Doppler were utilized during procedure). Indications:    CHF-acute diastolic  History:        Patient has prior history of Echocardiogram examinations, most                 recent 10/18/2022. CHF, CAD, Stroke, Arrythmias:Atrial                 Fibrillation  and RBBB, Signs/Symptoms:Fatigue; Risk                 Factors:Hypertension, Sleep Apnea, Former Smoker and                 Dyslipidemia.  Sonographer:    Vern Claude Referring Phys: [id number] CAROLE N HALL IMPRESSIONS  1. Left ventricular ejection fraction, by estimation, is 60 to 65%. The left ventricle has normal function. The left ventricle has no regional wall motion abnormalities. Left ventricular diastolic parameters are consistent with Grade II diastolic dysfunction (pseudonormalization).  2. Right ventricular systolic function is normal. The right ventricular size is normal. There is mildly elevated pulmonary artery systolic pressure. The estimated right ventricular systolic pressure is 39.7 mmHg.  3. Left atrial size was moderately dilated.  4. Eccentric posteriorly directed MR jet makes quantificaiton difficult but at least moderate. No pulmonary vein reversal. Appears similar to previous echocardiogram. The mitral valve is abnormal. Moderate to severe mitral valve regurgitation. No evidence of mitral stenosis.  5. The aortic valve is tricuspid. Aortic valve regurgitation is mild to moderate. No aortic stenosis is present.  6. The inferior vena cava is normal in size with greater than 50% respiratory variability, suggesting right atrial pressure of 3 mmHg. FINDINGS  Left Ventricle: Left ventricular ejection fraction, by estimation, is 60 to 65%. The left ventricle has normal function. The left ventricle has no regional wall motion abnormalities. Strain imaging was not performed. The left ventricular internal cavity  size was normal in size. There is no left ventricular hypertrophy. Left ventricular diastolic parameters are consistent with Grade II diastolic dysfunction (pseudonormalization). Right Ventricle: The right ventricular size is normal. No increase in right ventricular wall thickness. Right ventricular systolic function is normal. There is mildly elevated pulmonary artery systolic pressure. The  tricuspid regurgitant velocity is 3.03  m/s, and with an assumed right atrial pressure of 3 mmHg, the estimated right ventricular systolic pressure is 39.7 mmHg. Left Atrium: Left atrial size was moderately dilated. Right Atrium: Right atrial size was normal in size. Pericardium: There is no evidence of pericardial effusion. Mitral Valve: Eccentric posteriorly directed MR jet makes quantificaiton difficult but at least moderate. No pulmonary vein reversal. Appears similar to previous echocardiogram. The mitral valve is abnormal. Moderate to severe mitral valve regurgitation.  No evidence of mitral valve stenosis. MV  peak gradient, 1.3 mmHg. The mean mitral valve gradient is 1.0 mmHg. Tricuspid Valve: The tricuspid valve is normal in structure. Tricuspid valve regurgitation is mild . No evidence of tricuspid stenosis. Aortic Valve: The aortic valve is tricuspid. Aortic valve regurgitation is mild to moderate. Aortic regurgitation PHT measures 812 msec. No aortic stenosis is present. Aortic valve mean gradient measures 4.0 mmHg. Aortic valve peak gradient measures 6.7 mmHg. Aortic valve area, by VTI measures 2.26 cm. Pulmonic Valve: The pulmonic valve was normal in structure. Pulmonic valve regurgitation is not visualized. No evidence of pulmonic stenosis. Aorta: The aortic root is normal in size and structure. Venous: The inferior vena cava is normal in size with greater than 50% respiratory variability, suggesting right atrial pressure of 3 mmHg. IAS/Shunts: No atrial level shunt detected by color flow Doppler. Additional Comments: 3D imaging was not performed.  LEFT VENTRICLE PLAX 2D LVIDd:         5.80 cm      Diastology LVIDs:         4.00 cm      LV e' medial:    3.24 cm/s LV PW:         1.20 cm      LV E/e' medial:  17.2 LV IVS:        1.20 cm      LV e' lateral:   5.35 cm/s LVOT diam:     2.20 cm      LV E/e' lateral: 10.4 LV SV:         48 LV SV Index:   21 LVOT Area:     3.80 cm  LV Volumes (MOD) LV vol d,  MOD A2C: 287.0 ml LV vol d, MOD A4C: 278.0 ml LV vol s, MOD A2C: 111.0 ml LV vol s, MOD A4C: 123.0 ml LV SV MOD A2C:     176.0 ml LV SV MOD A4C:     278.0 ml LV SV MOD BP:      171.1 ml RIGHT VENTRICLE             IVC RV Basal diam:  4.30 cm     IVC diam: 1.60 cm RV Mid diam:    2.90 cm RV S prime:     15.50 cm/s TAPSE (M-mode): 4.0 cm LEFT ATRIUM             Index        RIGHT ATRIUM           Index LA diam:        3.20 cm 1.38 cm/m   RA Area:     17.30 cm LA Vol (A2C):   69.5 ml 29.92 ml/m  RA Volume:   39.00 ml  16.79 ml/m LA Vol (A4C):   98.5 ml 42.41 ml/m LA Biplane Vol: 83.5 ml 35.95 ml/m  AORTIC VALVE                    PULMONIC VALVE AV Area (Vmax):    2.15 cm     PV Vmax:       0.85 m/s AV Area (Vmean):   1.92 cm     PV Peak grad:  2.9 mmHg AV Area (VTI):     2.26 cm AV Vmax:           129.00 cm/s AV Vmean:          91.400 cm/s AV VTI:  0.212 m AV Peak Grad:      6.7 mmHg AV Mean Grad:      4.0 mmHg LVOT Vmax:         73.10 cm/s LVOT Vmean:        46.100 cm/s LVOT VTI:          0.126 m LVOT/AV VTI ratio: 0.59 AI PHT:            812 msec  AORTA Ao Root diam: 3.60 cm Ao Asc diam:  3.90 cm MITRAL VALVE               TRICUSPID VALVE MV Area (PHT): 3.08 cm    TR Peak grad:   36.7 mmHg MV Area VTI:   2.90 cm    TR Vmax:        303.00 cm/s MV Peak grad:  1.3 mmHg MV Mean grad:  1.0 mmHg    SHUNTS MV Vmax:       0.56 m/s    Systemic VTI:  0.13 m MV Vmean:      41.6 cm/s   Systemic Diam: 2.20 cm MV Decel Time: 246 msec MR Peak grad: 88.5 mmHg MR Mean grad: 51.5 mmHg MR Vmax:      470.50 cm/s MR Vmean:     329.5 cm/s MV E velocity: 55.70 cm/s MV A velocity: 49.70 cm/s MV E/A ratio:  1.12 Clearnce Hasten Electronically signed by Clearnce Hasten Signature Date/Time: 08/30/2023/12:18:53 PM    Final    DG Chest Port 1 View Result Date: 08/29/2023 CLINICAL DATA:  Shortness of breath and cough. EXAM: PORTABLE CHEST 1 VIEW COMPARISON:  Chest radiograph dated 04/22/2019. FINDINGS: No focal  consolidation, pleural effusion, or pneumothorax. Mild cardiomegaly. No acute osseous pathology. IMPRESSION: 1. No active disease. 2. Mild cardiomegaly. Electronically Signed   By: Elgie Collard M.D.   On: 08/29/2023 15:08    Pending Labs Unresulted Labs (From admission, onward)     Start     Ordered   08/31/23 0500  Basic metabolic panel  Tomorrow morning,   R        08/30/23 0821   08/30/23 0344  Hemoglobin A1c  Add-on,   AD        08/30/23 0343            Vitals/Pain Today's Vitals   08/30/23 0730 08/30/23 0840 08/30/23 1100 08/30/23 1107  BP:  (!) 166/99 (!) 157/89   Pulse: 72 72 81   Resp: (!) 22 (!) 22 (!) 21   Temp:    97.6 F (36.4 C)  TempSrc:    Oral  SpO2: 96% 94% 96%   Weight:      Height:      PainSc:        Isolation Precautions No active isolations  Medications Medications  apixaban (ELIQUIS) tablet 5 mg (5 mg Oral Given 08/30/23 1054)  ezetimibe (ZETIA) tablet 10 mg (10 mg Oral Given 08/30/23 1054)  acetaminophen (TYLENOL) tablet 650 mg (has no administration in time range)  prochlorperazine (COMPAZINE) injection 5 mg (has no administration in time range)  polyethylene glycol (MIRALAX / GLYCOLAX) packet 17 g (has no administration in time range)  melatonin tablet 5 mg (has no administration in time range)  sacubitril-valsartan (ENTRESTO) 24-26 mg per tablet (1 tablet Oral Given 08/30/23 0637)  labetalol (NORMODYNE) injection 5 mg (has no administration in time range)  furosemide (LASIX) injection 40 mg (40 mg Intravenous Given 08/30/23 0637)  carvedilol (COREG) tablet 3.125 mg (  has no administration in time range)  furosemide (LASIX) injection 40 mg (40 mg Intravenous Given 08/30/23 0229)    Mobility walks with person assist     Focused Assessments Cardiac Assessment Handoff:  Cardiac Rhythm: Normal sinus rhythm Lab Results  Component Value Date   TROPONINI <0.03 10/30/2018   No results found for: "DDIMER" Does the Patient currently have  chest pain? No   Edema noted    R Recommendations: See Admitting Provider Note  Report given to:   Additional Notes:

## 2023-08-30 NOTE — Evaluation (Signed)
 Physical Therapy Evaluation Patient Details Name: [name] MRN: [id number] DOB: December 24, 1934 Today's Date: 08/30/2023  History of Present Illness  Patient is a 89 year old male who presented with SOB with minimal exertion. Patient was admitted with hypertensive urgency, acute on chronic HFpEF. PMH: a fib, coronary artery disease, heart failure, RBBB, prostate CA  Clinical Impression  Pt admitted with above diagnosis.  Pt is very pleasant gentleman, independent/mod I at his baseline. Pt able to amb ~ 15' x2 with SPC and CGA for safety, no DOE but reports mild fatigue with activity. (Pt has not been in his room on the unit very long, spent ~ 1 day in ED.) Anticipate pt will progress well in acute setting and doubt he will need f/u post acute.   Pt currently with functional limitations due to the deficits listed below (see PT Problem List). Pt will benefit from acute skilled PT to increase their independence and safety with mobility to allow discharge.           If plan is discharge home, recommend the following: Help with stairs or ramp for entrance   Can travel by private vehicle        Equipment Recommendations None recommended by PT  Recommendations for Other Services       Functional Status Assessment Patient has had a recent decline in their functional status and demonstrates the ability to make significant improvements in function in a reasonable and predictable amount of time.     Precautions / Restrictions Precautions Precautions: Fall Restrictions Weight Bearing Restrictions Per Provider Order: No      Mobility  Bed Mobility Overal bed mobility: Needs Assistance Bed Mobility: Supine to Sit     Supine to sit: Contact guard, Used rails, HOB elevated     General bed mobility comments: for safety    Transfers Overall transfer level: Needs assistance Equipment used: Straight cane Transfers: Sit to/from Stand Sit to Stand: Contact guard assist            General transfer comment: for safety    Ambulation/Gait Ambulation/Gait assistance: Contact guard assist Gait Distance (Feet): 15 Feet (x2;) Assistive device: Straight cane Gait Pattern/deviations: Step-through pattern       General Gait Details: initially unsteady gait with pt attempting to furniture surf with L hand, R hand SPC; stability improved second 15', pt denies dyspnea, mildly fatigued  Stairs            Wheelchair Mobility     Tilt Bed    Modified Rankin (Stroke Patients Only)       Balance Overall balance assessment: Needs assistance, History of Falls (1 mechanical fall in last 3 mos d/t uneven driveway/stepping stone) Sitting-balance support: Feet supported, No upper extremity supported Sitting balance-Leahy Scale: Good     Standing balance support: No upper extremity supported, During functional activity, Reliant on assistive device for balance, Single extremity supported Standing balance-Leahy Scale: Fair Standing balance comment: pt is able to stand and wash hands without UE support; cane for amb                             Pertinent Vitals/Pain Pain Assessment Pain Assessment: No/denies pain    Home Living Family/patient expects to be discharged to:: Private residence Living Arrangements: Non-relatives/Friends Available Help at Discharge: Family Type of Home: House       Alternate Level Stairs-Number of Steps: 4 flights, 4 level home Home Layout: Multi-level Home  Equipment: Gilmer Mor - single point      Prior Function Prior Level of Function : Independent/Modified Independent                     Extremity/Trunk Assessment   Upper Extremity Assessment Upper Extremity Assessment: Overall WFL for tasks assessed    Lower Extremity Assessment Lower Extremity Assessment: Overall WFL for tasks assessed       Communication   Communication Factors Affecting Communication: Hearing impaired (does not have hearing aides  with him)    Cognition Arousal: Alert Behavior During Therapy: WFL for tasks assessed/performed   PT - Cognitive impairments: No apparent impairments                         Following commands: Intact       Cueing Cueing Techniques: Verbal cues     General Comments      Exercises     Assessment/Plan    PT Assessment Patient needs continued PT services  PT Problem List Decreased activity tolerance;Decreased balance;Decreased mobility       PT Treatment Interventions Gait training;Functional mobility training;Therapeutic activities;Patient/family education;Therapeutic exercise;Balance training    PT Goals (Current goals can be found in the Care Plan section)  Acute Rehab PT Goals Patient Stated Goal: feel better PT Goal Formulation: With patient Time For Goal Achievement: 09/13/23 Potential to Achieve Goals: Good    Frequency Min 1X/week     Co-evaluation               AM-PAC PT "6 Clicks" Mobility  Outcome Measure Help needed turning from your back to your side while in a flat bed without using bedrails?: A Little Help needed moving from lying on your back to sitting on the side of a flat bed without using bedrails?: A Little Help needed moving to and from a bed to a chair (including a wheelchair)?: A Little Help needed standing up from a chair using your arms (e.g., wheelchair or bedside chair)?: A Little Help needed to walk in hospital room?: A Little Help needed climbing 3-5 steps with a railing? : A Little 6 Click Score: 18    End of Session Equipment Utilized During Treatment: Gait belt Activity Tolerance: Patient tolerated treatment well Patient left: with call bell/phone within reach;in chair;with chair alarm set Nurse Communication: Mobility status PT Visit Diagnosis: Other abnormalities of gait and mobility (R26.89)    Time: 0981-1914 PT Time Calculation (min) (ACUTE ONLY): 18 min   Charges:   PT Evaluation $PT Eval Low  Complexity: 1 Low   PT General Charges $$ ACUTE PT VISIT: 1 Visit         Khrystyne Arpin, PT  Acute Rehab Dept ([facility]) [phone]  08/30/2023   [facility] 08/30/2023, 5:04 PM

## 2023-08-31 ENCOUNTER — Other Ambulatory Visit (HOSPITAL_COMMUNITY): Payer: Self-pay

## 2023-08-31 DIAGNOSIS — R0600 Dyspnea, unspecified: Secondary | ICD-10-CM | POA: Diagnosis not present

## 2023-08-31 LAB — BASIC METABOLIC PANEL
Anion gap: 13 (ref 5–15)
BUN: 26 mg/dL — ABNORMAL HIGH (ref 8–23)
CO2: 25 mmol/L (ref 22–32)
Calcium: 8.2 mg/dL — ABNORMAL LOW (ref 8.9–10.3)
Chloride: 97 mmol/L — ABNORMAL LOW (ref 98–111)
Creatinine, Ser: 0.89 mg/dL (ref 0.61–1.24)
GFR, Estimated: >60 mL/min (ref >60)
Glucose, Bld: 156 mg/dL — ABNORMAL HIGH (ref 70–99)
Potassium: 3.1 mmol/L — ABNORMAL LOW (ref 3.5–5.1)
Sodium: 135 mmol/L (ref 135–145)

## 2023-08-31 LAB — HEMOGLOBIN A1C
Hgb A1c MFr Bld: 7.4 % — ABNORMAL HIGH (ref 4.8–5.6)
Mean Plasma Glucose: 166 mg/dL

## 2023-08-31 MED ORDER — POTASSIUM CHLORIDE CRYS ER 20 MEQ PO TBCR
60.0000 meq | EXTENDED_RELEASE_TABLET | Freq: Once | ORAL | Status: AC
  Administered 2023-08-31: 60 meq via ORAL
  Filled 2023-08-31: qty 3

## 2023-08-31 MED ORDER — FUROSEMIDE 20 MG PO TABS
20.0000 mg | ORAL_TABLET | Freq: Every day | ORAL | 0 refills | Status: DC
  Filled 2023-08-31: qty 60, 60d supply, fill #0

## 2023-08-31 MED ORDER — CARVEDILOL 3.125 MG PO TABS
3.1250 mg | ORAL_TABLET | Freq: Two times a day (BID) | ORAL | 0 refills | Status: DC
  Filled 2023-08-31: qty 60, 30d supply, fill #0

## 2023-08-31 MED ORDER — SACUBITRIL-VALSARTAN 24-26 MG PO TABS
1.0000 | ORAL_TABLET | Freq: Two times a day (BID) | ORAL | 0 refills | Status: DC
  Filled 2023-08-31: qty 60, 30d supply, fill #0

## 2023-08-31 NOTE — Progress Notes (Signed)
 Discharge instructions reviewed with patient. All questions answered. All belongings accounted for. Patient to follow up with MD in  1 weeks. PIV removed. Assisted via WC to private vehicle.

## 2023-08-31 NOTE — Discharge Summary (Signed)
 Physician Discharge Summary  [name] UJW:[id number] DOB: 1935/03/07 DOA: 08/29/2023  PCP: [name], MD  Admit date: 08/29/2023 Discharge date: 08/31/2023  Admitted From: Home Disposition:  Home  Discharge Condition:Stable CODE STATUS:FULL Diet recommendation: Heart Healthy   Brief/Interim Summary: Patient is 89 year old male with history of HFpEF, paroxysmal A-fib on Eliquis, coronary artery disease, ascending aortic dilatation, obesity who presented to emergency department from home with complaint of shortness of breath with minimal exertion.  He ran out of his Entresto 2 weeks ago.  Also reported bilateral lower extremity pitting edema.  Patient was tachypneic, severely hypertensive, appeared volume overloaded.  BNP was found to be elevated.  Chest x-ray showed cardiomegaly with mild pulmonary edema, elevated JVD.  Started on IV Lasix.  He had significant diuresis during this hospitalization.  Currently appears overall euvolemic.  Just has trace lower extremity edema.  Blood pressure stable this morning.  PT recommended no follow-up.  Medically stable for discharge home.  We recommend to follow-up with his PCP and cardiology as an outpatient.  Following problems were addressed during the hospitalization:  Dyspnea secondary to acute on chronic HFpEF: Presented with dyspnea on minimal exertion, lower extremity swelling.  Elevated BNP.  Appeared to be volume overloaded on exam as well as chest x-ray.  Started  Lasix IV 40 mg twice daily.  Last echo had shown EF of 55 to 60%, grade 1 diastolic function.  New echo showed EF of 62%, grade 2 diastolic dysfunction.  Currently he is on room air.  No shortness of breath or cough.  Lungs are clear on auscultation.  He can continue on Lasix 20 mg daily at home.  He needs to follow-up with his cardiologist, Dr. Excell Seltzer as an outpatient.  Hypertensive urgency: Ran out of Entresto few weeks ago.  Restarted.  Added Coreg.  Currently blood pressure  stable.  Paroxysmal A-fib: Currently normal sinus rhythm.  Added Coreg.  Currently on Eliquis for anticoagulation   Hyperlipidemia: On Zetia   History of coronary artery disease: No anginal symptoms.  Troponins mildly elevated with flat trend.   History of pulmonary hypertension: Follows with cardiology   Dilated ascending aorta: As per echo on 5/22, it measures about 43 mm.  Outpatient follow-up with vascular surgery recommended   Physical debility: PT/OT consulted, no follow-up recommended  Discharge Diagnoses:  Principal Problem:   Dyspnea    Discharge Instructions  Discharge Instructions     Diet - low sodium heart healthy   Complete by: As directed    Discharge instructions   Complete by: As directed    1)Please take prescribed medications as instructed 2)Follow up with your PCP in a week.  Do a BMP test during the follow-up to check your kidney function and potassium level 3)Follow up with your cardiologist   Increase activity slowly   Complete by: As directed       Allergies as of 08/31/2023       Reactions   Aspirin Other (See Comments)   History of "stomach ulcers," was told to not take this         Medication List     STOP taking these medications    Entresto 97-103 MG Generic drug: sacubitril-valsartan Replaced by: sacubitril-valsartan 24-26 MG   rosuvastatin 5 MG tablet Commonly known as: CRESTOR       TAKE these medications    carvedilol 3.125 MG tablet Commonly known as: COREG Take 1 tablet (3.125 mg total) by mouth 2 (two) times daily with a  meal.   Eliquis 5 MG Tabs tablet Generic drug: apixaban TAKE 1 TABLET(5 MG) BY MOUTH TWICE DAILY   ezetimibe 10 MG tablet Commonly known as: ZETIA TAKE 1 TABLET(10 MG) BY MOUTH DAILY   furosemide 20 MG tablet Commonly known as: Lasix Take 1 tablet (20 mg total) by mouth daily.   LUBRICANT EYE DROPS OP Place 1 drop into both eyes daily as needed (dry eyes).   sacubitril-valsartan 24-26  MG Commonly known as: ENTRESTO Take 1 tablet by mouth 2 (two) times daily. Replaces: Sherryll Burger 97-103 MG        Follow-up Information     [name], MD. Schedule an appointment as soon as possible for a visit in 1 week(s).   Specialty: Internal Medicine Contact information: [address] [place] [address] [phone]                Allergies  Allergen Reactions   Aspirin Other (See Comments)    History of "stomach ulcers," was told to not take this     Consultations: None   Procedures/Studies: ECHOCARDIOGRAM COMPLETE Result Date: 08/30/2023    ECHOCARDIOGRAM REPORT   Patient Name:   [name] Date of Exam: 08/30/2023 Medical Rec #:  [id number]    Height:       72.0 in Accession #:    [id number]   Weight:       245.0 lb Date of Birth:  March 12, 1935    BSA:          2.322 m Patient Age:    88 years     BP:           157/89 mmHg Patient Gender: M            HR:           77 bpm. Exam Location:  Inpatient Procedure: 2D Echo, Cardiac Doppler and Color Doppler (Both Spectral and Color            Flow Doppler were utilized during procedure). Indications:    CHF-acute diastolic  History:        Patient has prior history of Echocardiogram examinations, most                 recent 10/18/2022. CHF, CAD, Stroke, Arrythmias:Atrial                 Fibrillation and RBBB, Signs/Symptoms:Fatigue; Risk                 Factors:Hypertension, Sleep Apnea, Former Smoker and                 Dyslipidemia.  Sonographer:    Vern Claude Referring Phys: [id number] CAROLE N HALL IMPRESSIONS  1. Left ventricular ejection fraction, by estimation, is 60 to 65%. The left ventricle has normal function. The left ventricle has no regional wall motion abnormalities. Left ventricular diastolic parameters are consistent with Grade II diastolic dysfunction (pseudonormalization).  2. Right ventricular systolic function is normal. The right ventricular size is normal. There is mildly elevated pulmonary artery  systolic pressure. The estimated right ventricular systolic pressure is 39.7 mmHg.  3. Left atrial size was moderately dilated.  4. Eccentric posteriorly directed MR jet makes quantificaiton difficult but at least moderate. No pulmonary vein reversal. Appears similar to previous echocardiogram. The mitral valve is abnormal. Moderate to severe mitral valve regurgitation. No evidence of mitral stenosis.  5. The aortic valve is tricuspid. Aortic valve regurgitation is mild to moderate. No aortic stenosis is  present.  6. The inferior vena cava is normal in size with greater than 50% respiratory variability, suggesting right atrial pressure of 3 mmHg. FINDINGS  Left Ventricle: Left ventricular ejection fraction, by estimation, is 60 to 65%. The left ventricle has normal function. The left ventricle has no regional wall motion abnormalities. Strain imaging was not performed. The left ventricular internal cavity  size was normal in size. There is no left ventricular hypertrophy. Left ventricular diastolic parameters are consistent with Grade II diastolic dysfunction (pseudonormalization). Right Ventricle: The right ventricular size is normal. No increase in right ventricular wall thickness. Right ventricular systolic function is normal. There is mildly elevated pulmonary artery systolic pressure. The tricuspid regurgitant velocity is 3.03  m/s, and with an assumed right atrial pressure of 3 mmHg, the estimated right ventricular systolic pressure is 39.7 mmHg. Left Atrium: Left atrial size was moderately dilated. Right Atrium: Right atrial size was normal in size. Pericardium: There is no evidence of pericardial effusion. Mitral Valve: Eccentric posteriorly directed MR jet makes quantificaiton difficult but at least moderate. No pulmonary vein reversal. Appears similar to previous echocardiogram. The mitral valve is abnormal. Moderate to severe mitral valve regurgitation.  No evidence of mitral valve stenosis. MV peak  gradient, 1.3 mmHg. The mean mitral valve gradient is 1.0 mmHg. Tricuspid Valve: The tricuspid valve is normal in structure. Tricuspid valve regurgitation is mild . No evidence of tricuspid stenosis. Aortic Valve: The aortic valve is tricuspid. Aortic valve regurgitation is mild to moderate. Aortic regurgitation PHT measures 812 msec. No aortic stenosis is present. Aortic valve mean gradient measures 4.0 mmHg. Aortic valve peak gradient measures 6.7 mmHg. Aortic valve area, by VTI measures 2.26 cm. Pulmonic Valve: The pulmonic valve was normal in structure. Pulmonic valve regurgitation is not visualized. No evidence of pulmonic stenosis. Aorta: The aortic root is normal in size and structure. Venous: The inferior vena cava is normal in size with greater than 50% respiratory variability, suggesting right atrial pressure of 3 mmHg. IAS/Shunts: No atrial level shunt detected by color flow Doppler. Additional Comments: 3D imaging was not performed.  LEFT VENTRICLE PLAX 2D LVIDd:         5.80 cm      Diastology LVIDs:         4.00 cm      LV e' medial:    3.24 cm/s LV PW:         1.20 cm      LV E/e' medial:  17.2 LV IVS:        1.20 cm      LV e' lateral:   5.35 cm/s LVOT diam:     2.20 cm      LV E/e' lateral: 10.4 LV SV:         48 LV SV Index:   21 LVOT Area:     3.80 cm  LV Volumes (MOD) LV vol d, MOD A2C: 287.0 ml LV vol d, MOD A4C: 278.0 ml LV vol s, MOD A2C: 111.0 ml LV vol s, MOD A4C: 123.0 ml LV SV MOD A2C:     176.0 ml LV SV MOD A4C:     278.0 ml LV SV MOD BP:      171.1 ml RIGHT VENTRICLE             IVC RV Basal diam:  4.30 cm     IVC diam: 1.60 cm RV Mid diam:    2.90 cm RV S prime:     15.50  cm/s TAPSE (M-mode): 4.0 cm LEFT ATRIUM             Index        RIGHT ATRIUM           Index LA diam:        3.20 cm 1.38 cm/m   RA Area:     17.30 cm LA Vol (A2C):   69.5 ml 29.92 ml/m  RA Volume:   39.00 ml  16.79 ml/m LA Vol (A4C):   98.5 ml 42.41 ml/m LA Biplane Vol: 83.5 ml 35.95 ml/m  AORTIC VALVE                     PULMONIC VALVE AV Area (Vmax):    2.15 cm     PV Vmax:       0.85 m/s AV Area (Vmean):   1.92 cm     PV Peak grad:  2.9 mmHg AV Area (VTI):     2.26 cm AV Vmax:           129.00 cm/s AV Vmean:          91.400 cm/s AV VTI:            0.212 m AV Peak Grad:      6.7 mmHg AV Mean Grad:      4.0 mmHg LVOT Vmax:         73.10 cm/s LVOT Vmean:        46.100 cm/s LVOT VTI:          0.126 m LVOT/AV VTI ratio: 0.59 AI PHT:            812 msec  AORTA Ao Root diam: 3.60 cm Ao Asc diam:  3.90 cm MITRAL VALVE               TRICUSPID VALVE MV Area (PHT): 3.08 cm    TR Peak grad:   36.7 mmHg MV Area VTI:   2.90 cm    TR Vmax:        303.00 cm/s MV Peak grad:  1.3 mmHg MV Mean grad:  1.0 mmHg    SHUNTS MV Vmax:       0.56 m/s    Systemic VTI:  0.13 m MV Vmean:      41.6 cm/s   Systemic Diam: 2.20 cm MV Decel Time: 246 msec MR Peak grad: 88.5 mmHg MR Mean grad: 51.5 mmHg MR Vmax:      470.50 cm/s MR Vmean:     329.5 cm/s MV E velocity: 55.70 cm/s MV A velocity: 49.70 cm/s MV E/A ratio:  1.12 Clearnce Hasten Electronically signed by Clearnce Hasten Signature Date/Time: 08/30/2023/12:18:53 PM    Final    DG Chest Port 1 View Result Date: 08/29/2023 CLINICAL DATA:  Shortness of breath and cough. EXAM: PORTABLE CHEST 1 VIEW COMPARISON:  Chest radiograph dated 04/22/2019. FINDINGS: No focal consolidation, pleural effusion, or pneumothorax. Mild cardiomegaly. No acute osseous pathology. IMPRESSION: 1. No active disease. 2. Mild cardiomegaly. Electronically Signed   By: Elgie Collard M.D.   On: 08/29/2023 15:08      Subjective: Patient seen and examined at bedside today.  Hemodynamically stable.  Alert, awake, oriented.  On room air.  Denies any shortness of breath or cough.  Significant improvement in the bilateral lower EXTR edema.  Eager  to go home today.  We discussed about discharge planning.  I called both of her daughters on phone, calls not received  Discharge  Exam: Vitals:   08/31/23 0550 08/31/23  0911  BP: 120/76 120/76  Pulse: 68 68  Resp: 17   Temp: 98.4 F (36.9 C)   SpO2: 97%    Vitals:   08/30/23 2155 08/31/23 0500 08/31/23 0550 08/31/23 0911  BP: (!) 154/88  120/76 120/76  Pulse: 68  68 68  Resp: 18  17   Temp: 97.7 F (36.5 C)  98.4 F (36.9 C)   TempSrc: Oral  Oral   SpO2: 96%  97%   Weight:  107.6 kg    Height:        General: Pt is alert, awake, not in acute distress Cardiovascular: RRR, S1/S2 +, no rubs, no gallops Respiratory: CTA bilaterally, no wheezing, no rhonchi Abdominal: Soft, NT, ND, bowel sounds + Extremities: trace bilateral lower extremity edema, no cyanosis    The results of significant diagnostics from this hospitalization (including imaging, microbiology, ancillary and laboratory) are listed below for reference.     Microbiology: Recent Results (from the past 240 hours)  Resp panel by RT-PCR (RSV, Flu A&B, Covid) Anterior Nasal Swab     Status: None   Collection Time: 08/29/23 12:45 PM   Specimen: Anterior Nasal Swab  Result Value Ref Range Status   SARS Coronavirus 2 by RT PCR NEGATIVE NEGATIVE Final    Comment: (NOTE) SARS-CoV-2 target nucleic acids are NOT DETECTED.  The SARS-CoV-2 RNA is generally detectable in upper respiratory specimens during the acute phase of infection. The lowest concentration of SARS-CoV-2 viral copies this assay can detect is 138 copies/mL. A negative result does not preclude SARS-Cov-2 infection and should not be used as the sole basis for treatment or other patient management decisions. A negative result may occur with  improper specimen collection/handling, submission of specimen other than nasopharyngeal swab, presence of viral mutation(s) within the areas targeted by this assay, and inadequate number of viral copies(<138 copies/mL). A negative result must be combined with clinical observations, patient history, and epidemiological information. The expected result is Negative.  Fact Sheet for  Patients:  BloggerCourse.com  Fact Sheet for Healthcare Providers:  SeriousBroker.it  This test is no t yet approved or cleared by the [place] FDA and  has been authorized for detection and/or diagnosis of SARS-CoV-2 by FDA under an Emergency Use Authorization (EUA). This EUA will remain  in effect (meaning this test can be used) for the duration of the COVID-19 declaration under Section 564(b)(1) of the Act, 21 U.S.C.section 360bbb-3(b)(1), unless the authorization is terminated  or revoked sooner.       Influenza A by PCR NEGATIVE NEGATIVE Final   Influenza B by PCR NEGATIVE NEGATIVE Final    Comment: (NOTE) The Xpert Xpress SARS-CoV-2/FLU/RSV plus assay is intended as an aid in the diagnosis of influenza from Nasopharyngeal swab specimens and should not be used as a sole basis for treatment. Nasal washings and aspirates are unacceptable for Xpert Xpress SARS-CoV-2/FLU/RSV testing.  Fact Sheet for Patients: BloggerCourse.com  Fact Sheet for Healthcare Providers: SeriousBroker.it  This test is not yet approved or cleared by the [place] FDA and has been authorized for detection and/or diagnosis of SARS-CoV-2 by FDA under an Emergency Use Authorization (EUA). This EUA will remain in effect (meaning this test can be used) for the duration of the COVID-19 declaration under Section 564(b)(1) of the Act, 21 U.S.C. section 360bbb-3(b)(1), unless the authorization is terminated or revoked.     Resp Syncytial Virus by PCR NEGATIVE NEGATIVE Final    Comment: (  NOTE) Fact Sheet for Patients: BloggerCourse.com  Fact Sheet for Healthcare Providers: SeriousBroker.it  This test is not yet approved or cleared by the [place] FDA and has been authorized for detection and/or diagnosis of SARS-CoV-2 by FDA under an Emergency  Use Authorization (EUA). This EUA will remain in effect (meaning this test can be used) for the duration of the COVID-19 declaration under Section 564(b)(1) of the Act, 21 U.S.C. section 360bbb-3(b)(1), unless the authorization is terminated or revoked.  Performed at [facility], [address] [place], [place] [address]      Labs: BNP (last 3 results) Recent Labs    08/29/23 2347  BNP 417.0*   Basic Metabolic Panel: Recent Labs  Lab 08/30/23 0121 08/30/23 0128 08/31/23 0424  NA 140 142 135  K 4.0 4.0 3.1*  CL 106 106 97*  CO2 23  --  25  GLUCOSE 140* 137* 156*  BUN 20 20 26*  CREATININE 0.90 1.00 0.89  CALCIUM 8.8*  --  8.2*   Liver Function Tests: Recent Labs  Lab 08/30/23 0121  AST 23  ALT 35  ALKPHOS 80  BILITOT 1.2  PROT 7.4  ALBUMIN 3.8   No results for input(s): "LIPASE", "AMYLASE" in the last 168 hours. No results for input(s): "AMMONIA" in the last 168 hours. CBC: Recent Labs  Lab 08/30/23 0128 08/30/23 0843  WBC  --  6.3  HGB 14.3 12.9*  HCT 42.0 41.5  MCV  --  94.1  PLT  --  194   Cardiac Enzymes: No results for input(s): "CKTOTAL", "CKMB", "CKMBINDEX", "TROPONINI" in the last 168 hours. BNP: Invalid input(s): "POCBNP" CBG: No results for input(s): "GLUCAP" in the last 168 hours. D-Dimer No results for input(s): "DDIMER" in the last 72 hours. Hgb A1c Recent Labs    08/30/23 0421  HGBA1C 7.4*   Lipid Profile No results for input(s): "CHOL", "HDL", "LDLCALC", "TRIG", "CHOLHDL", "LDLDIRECT" in the last 72 hours. Thyroid function studies No results for input(s): "TSH", "T4TOTAL", "T3FREE", "THYROIDAB" in the last 72 hours.  Invalid input(s): "FREET3" Anemia work up No results for input(s): "VITAMINB12", "FOLATE", "FERRITIN", "TIBC", "IRON", "RETICCTPCT" in the last 72 hours. Urinalysis    Component Value Date/Time   COLORURINE YELLOW 08/30/2023 0100   APPEARANCEUR HAZY (A) 08/30/2023 0100   LABSPEC 1.020  08/30/2023 0100   PHURINE 5.0 08/30/2023 0100   GLUCOSEU NEGATIVE 08/30/2023 0100   HGBUR NEGATIVE 08/30/2023 0100   BILIRUBINUR NEGATIVE 08/30/2023 0100   KETONESUR NEGATIVE 08/30/2023 0100   PROTEINUR 30 (A) 08/30/2023 0100   UROBILINOGEN 0.2 11/25/2008 0201   NITRITE NEGATIVE 08/30/2023 0100   LEUKOCYTESUR NEGATIVE 08/30/2023 0100   Sepsis Labs Recent Labs  Lab 08/30/23 0843  WBC 6.3   Microbiology Recent Results (from the past 240 hours)  Resp panel by RT-PCR (RSV, Flu A&B, Covid) Anterior Nasal Swab     Status: None   Collection Time: 08/29/23 12:45 PM   Specimen: Anterior Nasal Swab  Result Value Ref Range Status   SARS Coronavirus 2 by RT PCR NEGATIVE NEGATIVE Final    Comment: (NOTE) SARS-CoV-2 target nucleic acids are NOT DETECTED.  The SARS-CoV-2 RNA is generally detectable in upper respiratory specimens during the acute phase of infection. The lowest concentration of SARS-CoV-2 viral copies this assay can detect is 138 copies/mL. A negative result does not preclude SARS-Cov-2 infection and should not be used as the sole basis for treatment or other patient management decisions. A negative result may occur with  improper specimen collection/handling, submission of specimen other than nasopharyngeal swab, presence of viral mutation(s) within the areas targeted by this assay, and inadequate number of viral copies(<138 copies/mL). A negative result must be combined with clinical observations, patient history, and epidemiological information. The expected result is Negative.  Fact Sheet for Patients:  BloggerCourse.com  Fact Sheet for Healthcare Providers:  SeriousBroker.it  This test is no t yet approved or cleared by the [place] FDA and  has been authorized for detection and/or diagnosis of SARS-CoV-2 by FDA under an Emergency Use Authorization (EUA). This EUA will remain  in effect (meaning this test can  be used) for the duration of the COVID-19 declaration under Section 564(b)(1) of the Act, 21 U.S.C.section 360bbb-3(b)(1), unless the authorization is terminated  or revoked sooner.       Influenza A by PCR NEGATIVE NEGATIVE Final   Influenza B by PCR NEGATIVE NEGATIVE Final    Comment: (NOTE) The Xpert Xpress SARS-CoV-2/FLU/RSV plus assay is intended as an aid in the diagnosis of influenza from Nasopharyngeal swab specimens and should not be used as a sole basis for treatment. Nasal washings and aspirates are unacceptable for Xpert Xpress SARS-CoV-2/FLU/RSV testing.  Fact Sheet for Patients: BloggerCourse.com  Fact Sheet for Healthcare Providers: SeriousBroker.it  This test is not yet approved or cleared by the [place] FDA and has been authorized for detection and/or diagnosis of SARS-CoV-2 by FDA under an Emergency Use Authorization (EUA). This EUA will remain in effect (meaning this test can be used) for the duration of the COVID-19 declaration under Section 564(b)(1) of the Act, 21 U.S.C. section 360bbb-3(b)(1), unless the authorization is terminated or revoked.     Resp Syncytial Virus by PCR NEGATIVE NEGATIVE Final    Comment: (NOTE) Fact Sheet for Patients: BloggerCourse.com  Fact Sheet for Healthcare Providers: SeriousBroker.it  This test is not yet approved or cleared by the [place] FDA and has been authorized for detection and/or diagnosis of SARS-CoV-2 by FDA under an Emergency Use Authorization (EUA). This EUA will remain in effect (meaning this test can be used) for the duration of the COVID-19 declaration under Section 564(b)(1) of the Act, 21 U.S.C. section 360bbb-3(b)(1), unless the authorization is terminated or revoked.  Performed at [facility], [address] [place], [place] [address]     Please note: You were  cared for by a hospitalist during your hospital stay. Once you are discharged, your primary care physician will handle any further medical issues. Please note that NO REFILLS for any discharge medications will be authorized once you are discharged, as it is imperative that you return to your primary care physician (or establish a relationship with a primary care physician if you do not have one) for your post hospital discharge needs so that they can reassess your need for medications and monitor your lab values.    Time coordinating discharge: 40 minutes  SIGNED:   Burnadette Pop, MD  [facility] 08/31/2023, 11:45 AM Pager [phone]  If 7PM-7AM, please contact night-coverage [email or web address] Password [email or web address]

## 2023-08-31 NOTE — Progress Notes (Signed)
 Meds delivered from [facility] outpatient pharmacy by this RN

## 2023-08-31 NOTE — Evaluation (Signed)
 Occupational Therapy Evaluation Patient Details Name: [name] MRN: [id number] DOB: 04-Mar-1935 Today's Date: 08/31/2023   History of Present Illness   Patient is a 89 year old male who presented with SOB with minimal exertion. Patient was admitted with hypertensive urgency, acute on chronic HFpEF. PMH: a fib, coronary artery disease, heart failure, RBBB, prostate CA     Clinical Impressions Patient is a 89 year old male who was admitted for above. 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 Patient was supervision in room at cane level for toileting, standing at sink for grooming tasks. Patient is eager to d/c home today. No OT follow up anticipated.      If plan is discharge home, recommend the following:   A little help with walking and/or transfers;A little help with bathing/dressing/bathroom;Assistance with cooking/housework;Direct supervision/assist for medications management;Assist for transportation;Help with stairs or ramp for entrance;Direct supervision/assist for financial management     Functional Status Assessment   Patient has had a recent decline in their functional status and demonstrates the ability to make significant improvements in function in a reasonable and predictable amount of time.     Equipment Recommendations   None recommended by OT      Precautions/Restrictions   Precautions Precautions: Fall Restrictions Weight Bearing Restrictions Per Provider Order: No     Mobility Bed Mobility               General bed mobility comments: in recliner and returned to the same           Balance Overall balance assessment: Needs assistance, History of Falls Sitting-balance support: Feet supported, No upper extremity supported Sitting balance-Leahy Scale: Good     Standing balance support: No upper extremity supported, During functional activity, Reliant on assistive device for balance, Single extremity supported Standing  balance-Leahy Scale: Fair         ADL either performed or assessed with clinical judgement   ADL Overall ADL's : Needs assistance/impaired Eating/Feeding: Modified independent;Sitting   Grooming: Sitting;Contact guard assist   Upper Body Bathing: Sitting;Supervision/ safety   Lower Body Bathing: Sitting/lateral leans;Supervison/ safety   Upper Body Dressing : Sitting;Supervision/safety   Lower Body Dressing: Sitting/lateral leans;Supervision/safety   Toilet Transfer: Supervision/safety;Ambulation Toilet Transfer Details (indicate cue type and reason): with cane Toileting- Clothing Manipulation and Hygiene: Sit to/from stand;Supervision/safety                Pertinent Vitals/Pain Pain Assessment Pain Assessment: No/denies pain     Extremity/Trunk Assessment Upper Extremity Assessment Upper Extremity Assessment: Overall WFL for tasks assessed   Lower Extremity Assessment Lower Extremity Assessment: Defer to PT evaluation          Cognition Arousal: Alert Behavior During Therapy: [facility] for tasks assessed/performed                                 Following commands: Intact       Cueing  General Comments   Cueing Techniques: Verbal cues              Home Living Family/patient expects to be discharged to:: Private residence Living Arrangements: Non-relatives/Friends Available Help at Discharge: Family Type of Home: House Home Access: Stairs to enter     Home Layout: Multi-level Alternate Level Stairs-Number of Steps: 4 flights, 4 level home Alternate Level Stairs-Rails: Right;Left Bathroom Shower/Tub: Runner, broadcasting/film/video: Medical laboratory scientific officer -  single point          Prior Functioning/Environment Prior Level of Function : Independent/Modified Independent                    OT Problem List: Decreased activity tolerance;Decreased knowledge of use of DME or AE;Decreased safety awareness   OT  Treatment/Interventions: Self-care/ADL training;DME and/or AE instruction;Therapeutic activities;Balance training;Patient/family education;Energy conservation      OT Goals(Current goals can be found in the care plan section)   Acute Rehab OT Goals Patient Stated Goal: to go home today OT Goal Formulation: With patient Time For Goal Achievement: 09/14/23 Potential to Achieve Goals: Fair   OT Frequency:  Min 1X/week       AM-PAC OT "6 Clicks" Daily Activity     Outcome Measure Help from another person eating meals?: None Help from another person taking care of personal grooming?: None Help from another person toileting, which includes using toliet, bedpan, or urinal?: A Little Help from another person bathing (including washing, rinsing, drying)?: A Little Help from another person to put on and taking off regular upper body clothing?: None Help from another person to put on and taking off regular lower body clothing?: A Little 6 Click Score: 21   End of Session Equipment Utilized During Treatment: Gait belt Nurse Communication: Mobility status  Activity Tolerance: Patient tolerated treatment well Patient left: in chair;with call bell/phone within reach;with chair alarm set  OT Visit Diagnosis: Unsteadiness on feet (R26.81)                Time: 1124-1140 OT Time Calculation (min): 16 min Charges:  OT General Charges $OT Visit: 1 Visit OT Evaluation $OT Eval Low Complexity: 1 Low  Alfreda Hammad OTR/L, MS Acute Rehabilitation Department Office# [phone]   Selinda Flavin 08/31/2023, 12:25 PM

## 2023-08-31 NOTE — Plan of Care (Signed)

## 2023-09-01 ENCOUNTER — Other Ambulatory Visit (HOSPITAL_COMMUNITY): Payer: Self-pay

## 2023-09-01 ENCOUNTER — Telehealth: Payer: Self-pay | Admitting: Internal Medicine

## 2023-09-01 ENCOUNTER — Encounter: Payer: Self-pay | Admitting: Cardiovascular Disease

## 2023-09-01 MED ORDER — SACUBITRIL-VALSARTAN 24-26 MG PO TABS: 1.0000 | ORAL_TABLET | Freq: Two times a day (BID) | ORAL | 2 refills | Status: DC

## 2023-09-01 NOTE — Telephone Encounter (Signed)
*  STAT* If patient is at the pharmacy, call can be transferred to refill team.   1. Which medications need to be refilled? (please list name of each medication and dose if known) sacubitril-valsartan (ENTRESTO) 24-26 MG   2. Which pharmacy/location (including street and city if local pharmacy) is medication to be sent to? [id number] - [place], [place] - [address] AT [facility] [address] Phone: [phone]  Fax: [phone]     3. Do they need a 30 day or 90 day supply? 90 Pt is out of medication

## 2023-09-20 ENCOUNTER — Encounter (HOSPITAL_COMMUNITY): Payer: Self-pay

## 2023-09-20 ENCOUNTER — Ambulatory Visit (HOSPITAL_COMMUNITY): Payer: Medicare Other | Attending: Cardiovascular Disease | Admitting: Cardiovascular Disease

## 2023-09-20 ENCOUNTER — Encounter: Payer: Self-pay | Admitting: Cardiovascular Disease

## 2023-09-20 ENCOUNTER — Ambulatory Visit (HOSPITAL_COMMUNITY): Payer: Medicare Other

## 2023-09-20 VITALS — BP 138/80 | HR 63 | Ht 72.0 in | Wt 239.0 lb

## 2023-09-20 DIAGNOSIS — I2510 Atherosclerotic heart disease of native coronary artery without angina pectoris: Secondary | ICD-10-CM | POA: Insufficient documentation

## 2023-09-20 DIAGNOSIS — I10 Essential (primary) hypertension: Secondary | ICD-10-CM | POA: Insufficient documentation

## 2023-09-20 DIAGNOSIS — Z01810 Encounter for preprocedural cardiovascular examination: Secondary | ICD-10-CM | POA: Diagnosis not present

## 2023-09-20 DIAGNOSIS — E782 Mixed hyperlipidemia: Secondary | ICD-10-CM | POA: Insufficient documentation

## 2023-09-20 DIAGNOSIS — Z8679 Personal history of other diseases of the circulatory system: Secondary | ICD-10-CM | POA: Insufficient documentation

## 2023-09-20 DIAGNOSIS — I340 Nonrheumatic mitral (valve) insufficiency: Secondary | ICD-10-CM | POA: Diagnosis not present

## 2023-09-20 DIAGNOSIS — I480 Paroxysmal atrial fibrillation: Secondary | ICD-10-CM | POA: Diagnosis not present

## 2023-09-20 DIAGNOSIS — I5032 Chronic diastolic (congestive) heart failure: Secondary | ICD-10-CM | POA: Insufficient documentation

## 2023-09-20 DIAGNOSIS — Z9889 Other specified postprocedural states: Secondary | ICD-10-CM | POA: Insufficient documentation

## 2023-09-20 DIAGNOSIS — I77810 Thoracic aortic ectasia: Secondary | ICD-10-CM | POA: Insufficient documentation

## 2023-09-20 DIAGNOSIS — I5020 Unspecified systolic (congestive) heart failure: Secondary | ICD-10-CM | POA: Insufficient documentation

## 2023-09-20 NOTE — H&P (View-Only) (Signed)
 Cardiology Office Note:    Date:  09/23/2023   ID:  [name], DOB Mar 02, 1935, MRN [id number]  PCP:  [name], MD   [name] Cardiologist:  Tonny Bollman, MD Cardiology APP:  Beatrice Lecher, PA-C  Electrophysiologist:  [name], MD (Inactive)     Referring MD: [name], MD   Chief Complaint  Patient presents with   Shortness of Breath    History of Present Illness:    [name] is a 89 y.o. male with a hx ofL  Hx of syncope Vasovagal (Admx 10/2018) NSVT  (HFrEF) heart failure with reduced ejection fraction (imp to normal) Hx of tachy mediated CM in 12/19 (AF w RVR) >> EF returned to normal in NSR Non-ischemic cardiomyopathy  Echocardiogram 04/2019: EF 35-40 Cath w mod non-obs CAD Echocardiogram 5/22: EF 55-60  Coronary artery disease  Cath 04/2019: mod non-obstructive CAD  Persistent atrial fibrillation  CHA2DS2-VASc Score = 8 [CHF History: 1, HTN History: 1, Diabetes History: 1, Stroke History: 2, Vascular Disease History: 1, Age Score: 2, Gender Score: 0].    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 Excell Seltzer - med Rx if remains asymptomatic) Pulmonary HTN  Diabetes mellitus  Hypertension  Hyperlipidemia  Chronic kidney disease  Prior CVA  OSA  Prostate CA  Dementia  RBBB   The patient is here alone today.  He was recently hospitalized with acute on chronic heart failure after discontinuation of Entresto and presenting with hypertensive urgency.  States that his breathing has improved now after he received IV diuresis for an overnight hospitalization.  He is back on Entresto, furosemide, and carvedilol.  He reports compliance with his medications.  No orthopnea, PND, or leg swelling.  No chest pain or pressure.  An  echocardiogram performed in the hospital showed moderate to severe eccentric mitral regurgitation.  Current Medications: Current Meds  Medication Sig   albuterol (VENTOLIN HFA) 108 (90 Base) MCG/ACT inhaler 2 puffs every 6 (six) hours as needed for shortness of breath or wheezing.   Carboxymethylcellulose Sodium (LUBRICANT EYE DROPS OP) Place 1 drop into both eyes daily as needed (dry eyes).   carvedilol (COREG) 3.125 MG tablet Take 1 tablet (3.125 mg total) by mouth 2 (two) times daily with a meal.   ELIQUIS 5 MG TABS tablet TAKE 1 TABLET(5 MG) BY MOUTH TWICE DAILY   ezetimibe (ZETIA) 10 MG tablet TAKE 1 TABLET(10 MG) BY MOUTH DAILY   furosemide (LASIX) 20 MG tablet Take 1 tablet (20 mg total) by mouth daily.   sacubitril-valsartan (ENTRESTO) 24-26 MG Take 1 tablet by mouth 2 (two) times daily.     Allergies:   Aspirin   ROS:   Please see the history of present illness.    All other systems reviewed and are negative.  EKGs/Labs/Other Studies Reviewed:    The following studies were reviewed today: Cardiac Studies & Procedures   ______________________________________________________________________________________________ CARDIAC CATHETERIZATION  CARDIAC CATHETERIZATION 04/25/2019  Narrative  Hemodynamic findings consistent with mild pulmonary hypertension - mean PAP 28 mmHg  LV end diastolic pressure is mildly elevated. - LVEDP 12-15 mmHg  Cardiac output-index 7.44-3.27.  ---------------------------------  Ost LM to Mid LM lesion is 20% stenosed. Ost Cx lesion is 30% stenosed. -- calcified  1st Mrg lesion is 60% stenosed.  Dist LAD lesion is 55% stenosed with 55% stenosed side branch in 2nd Diag. - the downstream LAD is relatively the same diameter as noted in the stenosis.  Otherwise minimal to mild disease throughout.  SUMMARY  Diffuse mild to moderate CAD: Most notable 60% proximal OM1, 50 to 60% mid LAD at D2.  No obvious culprit lesion to explain significantly  reduced EF.  Mild proximal LM and ostial LCx-calcified lesions.  Mild pulmonary hypertension with mean PA pressure of 28 mmHg.  LVEDP and PCWP from 12 to 15 mmHg.  Borderline CO-CI  RECOMMENDATIONS  Return to nursing unit for ongoing care, TR band removal.  Would continue aggressive risk factor modification and heart failure medications.  Appears to be at relatively well diuresed, would titrate up afterload reduction  If he has any concerning symptoms for possible angina, would consider noninvasive evaluation with Myoview, or perhaps coronary CTA to determine presence of either anterior or lateral ischemia suggesting LAD or OM lesions are more significant than they appear angiographically.    Bryan Lemma, M.D., M.S. Interventional Cardiologist  Pager # [phone] Phone # [phone] [address] [place], [place] [address]  Findings Coronary Findings Diagnostic  Dominance: Right  Left Main Ost LM to Mid LM lesion is 20% stenosed. The lesion is concentric. The lesion is mildly calcified.  Left Anterior Descending There is mild diffuse disease throughout the vessel. The vessel is calcified. Proximal third of the vessel is mild to moderately calcified Dist LAD lesion is 55% stenosed with 55% stenosed side branch in 2nd Diag. The lesion is located at the major branch and eccentric. The downstream LAD beyond this appeared to be relatively small in diameter.  Lesion does not appear to be flow-limiting  First Diagonal Branch Vessel is small in size.  Second Diagonal Branch Vessel is small in size. There is mild disease in the vessel.  Second Septal Branch Vessel is small in size.  Left Circumflex There is mild diffuse disease throughout the vessel. Ost Cx lesion is 30% stenosed. Ostial The lesion is calcified. Prox Cx lesion is 25% stenosed. The lesion is focal. Prox Cx to Mid Cx lesion is 35% stenosed. The lesion is irregular.  First Obtuse Marginal  Branch Vessel is small in size. There is mild disease in the vessel. 1st Mrg lesion is 60% stenosed.  Left Posterior Atrioventricular Artery Vessel is small in size.  Right Coronary Artery Vessel is large. Actually very large The vessel exhibits minimal luminal irregularities.  Acute Marginal Branch Vessel is small in size.  Right Posterior Descending Artery Vessel is large in size. Wraparound PDA  First Right Posterolateral Branch Vessel is small in size.  Intervention  No interventions have been documented.   STRESS TESTS  MYOCARDIAL PERFUSION IMAGING 10/01/2021  Narrative   Findings are consistent with prior myocardial infarction and no peri-infarct ischemia. The study is intermediate risk due to depresesd ejection fraction.   No ST deviation was noted.   Left ventricular function is abnormal. Global function is moderately reduced. Nuclear stress EF: 41 %. The left ventricular ejection fraction is moderately decreased (30-44%). End diastolic cavity size is moderately enlarged.   Prior study not available for comparison.   ECHOCARDIOGRAM  ECHOCARDIOGRAM COMPLETE 08/30/2023  Narrative ECHOCARDIOGRAM REPORT    Patient Name:   [name] Date of Exam: 08/30/2023 Medical Rec #:  [id number]    Height:       72.0 in Accession #:    [id number]   Weight:  245.0 lb Date of Birth:  1934-08-25    BSA:          2.322 m Patient Age:    88 years     BP:           157/89 mmHg Patient Gender: M            HR:           77 bpm. Exam Location:  Inpatient  Procedure: 2D Echo, Cardiac Doppler and Color Doppler (Both Spectral and Color Flow Doppler were utilized during procedure).  Indications:    CHF-acute diastolic  History:        Patient has prior history of Echocardiogram examinations, most recent 10/18/2022. CHF, CAD, Stroke, Arrythmias:Atrial Fibrillation and RBBB, Signs/Symptoms:Fatigue; Risk Factors:Hypertension, Sleep Apnea, Former Smoker  and Dyslipidemia.  Sonographer:    Vern Claude Referring Phys: [id number] CAROLE N HALL  IMPRESSIONS   1. Left ventricular ejection fraction, by estimation, is 60 to 65%. The left ventricle has normal function. The left ventricle has no regional wall motion abnormalities. Left ventricular diastolic parameters are consistent with Grade II diastolic dysfunction (pseudonormalization). 2. Right ventricular systolic function is normal. The right ventricular size is normal. There is mildly elevated pulmonary artery systolic pressure. The estimated right ventricular systolic pressure is 39.7 mmHg. 3. Left atrial size was moderately dilated. 4. Eccentric posteriorly directed MR jet makes quantificaiton difficult but at least moderate. No pulmonary vein reversal. Appears similar to previous echocardiogram. The mitral valve is abnormal. Moderate to severe mitral valve regurgitation. No evidence of mitral stenosis. 5. The aortic valve is tricuspid. Aortic valve regurgitation is mild to moderate. No aortic stenosis is present. 6. The inferior vena cava is normal in size with greater than 50% respiratory variability, suggesting right atrial pressure of 3 mmHg.  FINDINGS Left Ventricle: Left ventricular ejection fraction, by estimation, is 60 to 65%. The left ventricle has normal function. The left ventricle has no regional wall motion abnormalities. Strain imaging was not performed. The left ventricular internal cavity size was normal in size. There is no left ventricular hypertrophy. Left ventricular diastolic parameters are consistent with Grade II diastolic dysfunction (pseudonormalization).  Right Ventricle: The right ventricular size is normal. No increase in right ventricular wall thickness. Right ventricular systolic function is normal. There is mildly elevated pulmonary artery systolic pressure. The tricuspid regurgitant velocity is 3.03 m/s, and with an assumed right atrial pressure of 3 mmHg, the  estimated right ventricular systolic pressure is 39.7 mmHg.  Left Atrium: Left atrial size was moderately dilated.  Right Atrium: Right atrial size was normal in size.  Pericardium: There is no evidence of pericardial effusion.  Mitral Valve: Eccentric posteriorly directed MR jet makes quantificaiton difficult but at least moderate. No pulmonary vein reversal. Appears similar to previous echocardiogram. The mitral valve is abnormal. Moderate to severe mitral valve regurgitation. No evidence of mitral valve stenosis. MV peak gradient, 1.3 mmHg. The mean mitral valve gradient is 1.0 mmHg.  Tricuspid Valve: The tricuspid valve is normal in structure. Tricuspid valve regurgitation is mild . No evidence of tricuspid stenosis.  Aortic Valve: The aortic valve is tricuspid. Aortic valve regurgitation is mild to moderate. Aortic regurgitation PHT measures 812 msec. No aortic stenosis is present. Aortic valve mean gradient measures 4.0 mmHg. Aortic valve peak gradient measures 6.7 mmHg. Aortic valve area, by VTI measures 2.26 cm.  Pulmonic Valve: The pulmonic valve was normal in structure. Pulmonic valve regurgitation is not visualized. No evidence of pulmonic stenosis.  Aorta: The aortic root is normal in size and structure.  Venous: The inferior vena cava is normal in size with greater than 50% respiratory variability, suggesting right atrial pressure of 3 mmHg.  IAS/Shunts: No atrial level shunt detected by color flow Doppler.  Additional Comments: 3D imaging was not performed.   LEFT VENTRICLE PLAX 2D LVIDd:         5.80 cm      Diastology LVIDs:         4.00 cm      LV e' medial:    3.24 cm/s LV PW:         1.20 cm      LV E/e' medial:  17.2 LV IVS:        1.20 cm      LV e' lateral:   5.35 cm/s LVOT diam:     2.20 cm      LV E/e' lateral: 10.4 LV SV:         48 LV SV Index:   21 LVOT Area:     3.80 cm  LV Volumes (MOD) LV vol d, MOD A2C: 287.0 ml LV vol d, MOD A4C: 278.0 ml LV  vol s, MOD A2C: 111.0 ml LV vol s, MOD A4C: 123.0 ml LV SV MOD A2C:     176.0 ml LV SV MOD A4C:     278.0 ml LV SV MOD BP:      171.1 ml  RIGHT VENTRICLE             IVC RV Basal diam:  4.30 cm     IVC diam: 1.60 cm RV Mid diam:    2.90 cm RV S prime:     15.50 cm/s TAPSE (M-mode): 4.0 cm  LEFT ATRIUM             Index        RIGHT ATRIUM           Index LA diam:        3.20 cm 1.38 cm/m   RA Area:     17.30 cm LA Vol (A2C):   69.5 ml 29.92 ml/m  RA Volume:   39.00 ml  16.79 ml/m LA Vol (A4C):   98.5 ml 42.41 ml/m LA Biplane Vol: 83.5 ml 35.95 ml/m AORTIC VALVE                    PULMONIC VALVE AV Area (Vmax):    2.15 cm     PV Vmax:       0.85 m/s AV Area (Vmean):   1.92 cm     PV Peak grad:  2.9 mmHg AV Area (VTI):     2.26 cm AV Vmax:           129.00 cm/s AV Vmean:          91.400 cm/s AV VTI:            0.212 m AV Peak Grad:      6.7 mmHg AV Mean Grad:      4.0 mmHg LVOT Vmax:         73.10 cm/s LVOT Vmean:        46.100 cm/s LVOT VTI:          0.126 m LVOT/AV VTI ratio: 0.59 AI PHT:            812 msec  AORTA Ao Root diam: 3.60 cm Ao Asc diam:  3.90 cm  MITRAL VALVE  TRICUSPID VALVE MV Area (PHT): 3.08 cm    TR Peak grad:   36.7 mmHg MV Area VTI:   2.90 cm    TR Vmax:        303.00 cm/s MV Peak grad:  1.3 mmHg MV Mean grad:  1.0 mmHg    SHUNTS MV Vmax:       0.56 m/s    Systemic VTI:  0.13 m MV Vmean:      41.6 cm/s   Systemic Diam: 2.20 cm MV Decel Time: 246 msec MR Peak grad: 88.5 mmHg MR Mean grad: 51.5 mmHg MR Vmax:      470.50 cm/s MR Vmean:     329.5 cm/s MV E velocity: 55.70 cm/s MV A velocity: 49.70 cm/s MV E/A ratio:  1.12  Clearnce Hasten Electronically signed by Clearnce Hasten Signature Date/Time: 08/30/2023/12:18:53 PM    Final   TEE  ECHO TEE 06/19/2018  Narrative *Fa[facility]* *[facility]* [address] [place], [place]  [address] [phone]  ------------------------------------------------------------------- Transesophageal Echocardiography  Patient:    Kenner, Lewan MR #:       [id number] Study Date: 06/19/2018 Gender:     M Age:        83 Height:     180.3 cm Weight:     117.8 kg BSA:        2.47 m^2 Pt. Status: Room:       6E29C  PERFORMING   Marca Ancona, M.D. ATTENDING    April Manson, Brittainy M REFERRING    [place], [place] M SONOGRAPHER  Sheralyn Boatman ADMITTING    Arrien, Mauricio  cc:  -------------------------------------------------------------------  ------------------------------------------------------------------- Indications:      Atrial fibrillation - 427.31. Severe valvular disease  ------------------------------------------------------------------- History:   PMH:  Elevated troponin. Cancer. RBBB.  Syncope. Stroke.  Primary pulmonary hypertension.  Risk factors: Hypertension. Diabetes mellitus. Dyslipidemia.  ------------------------------------------------------------------- Study Conclusions  - Left ventricle: Severe LV dilation with normal wall thickness. EF 25% with diffuse hypokinesis. Mildly dilated RV with moderate systolic dysfunction. - Aortic valve: There was no stenosis. There was mild regurgitation. - Aorta: Normal caliber aorta mild plaque. - Mitral valve: There was severe mitral regurgitation with anteriorly-directed jet. There appeared to be a ruptured chord and the anterior and posterior leaflets did not coapt completely with restriction of posterior leaflet. PISA ERO 0.78 cm^2. There was systolic flow reversal in the pulmonary vein doppler pattern. - Left atrium: The atrium was moderately dilated. No evidence of thrombus in the atrial cavity or appendage. No evidence of thrombus in the atrial cavity or appendage. - Right ventricle: The cavity size was mildly dilated. Systolic function was moderately reduced. - Right  atrium: The atrium was mildly dilated. - Atrial septum: Small PFO noted by color doppler. - Tricuspid valve: Mild tricuspid regurgitation, peak RV-RA gradient 24 mmHg.  Impressions:  - 1. No LA thrombus, may proceed to DCCV. 2. Severe LV dilation with EF 25%, diffuse hypokinesis. 3. Mild RV dilation with moderate systolic dysfunction. 4. Severe MR with restriction of posterior leaflet and evidence for a ruptured chord.  ------------------------------------------------------------------- Labs, prior tests, procedures, and surgery: ECG.     Abnormal.  ------------------------------------------------------------------- Study data:   Study status:  Routine.  Consent:  The risks, benefits, and alternatives to the procedure were explained to the patient and informed consent was obtained.  Procedure:  The patient reported no pain pre or post test. Initial setup. The patient was brought to  the laboratory. Surface ECG leads were monitored. Sedation. Conscious sedation was administered by anesthesiology staff. Transesophageal echocardiography. Topical anesthesia was obtained using viscous lidocaine. An adult multiplane transesophageal probe was inserted by the attending cardiologistwithout difficulty. 3D image quality was excellent. Study completion:  The patient tolerated the procedure well. There were no complications.  Administered medications:   Propofol, IV. Diagnostic transesophageal echocardiography.  2D and color Doppler.  Birthdate:  Patient birthdate: 05/14/1935.  Age:  Patient is 89 yr old.  Sex:  Gender: male.    BMI: 36.2 kg/m^2.  Blood pressure:     103/80  Patient status:  Inpatient.  Study date: Study date: 06/19/2018. Study time: 09:03 AM.  Location: Endoscopy.  -------------------------------------------------------------------  ------------------------------------------------------------------- Left ventricle:  Severe LV dilation with normal wall thickness. EF 25%  with diffuse hypokinesis. Mildly dilated RV with moderate systolic dysfunction.  ------------------------------------------------------------------- Aortic valve:   Trileaflet.  Doppler:   There was no stenosis. There was mild regurgitation.  ------------------------------------------------------------------- Aorta:  Normal caliber aorta mild plaque.  ------------------------------------------------------------------- Mitral valve:  There was severe mitral regurgitation with anteriorly-directed jet. There appeared to be a ruptured chord and the anterior and posterior leaflets did not coapt completely with restriction of posterior leaflet. PISA ERO 0.78 cm^2. There was systolic flow reversal in the pulmonary vein doppler pattern. Doppler:   There was no evidence for stenosis.  ------------------------------------------------------------------- Left atrium:  The atrium was moderately dilated.  No evidence of thrombus in the atrial cavity or appendage.  No evidence of thrombus in the atrial cavity or appendage.  ------------------------------------------------------------------- Atrial septum:  Small PFO noted by color doppler.  ------------------------------------------------------------------- Right ventricle:  The cavity size was mildly dilated. Systolic function was moderately reduced.  ------------------------------------------------------------------- Pulmonic valve:    Structurally normal valve.   Cusp separation was normal.  ------------------------------------------------------------------- Tricuspid valve:  Mild tricuspid regurgitation, peak RV-RA gradient 24 mmHg.  ------------------------------------------------------------------- Right atrium:  The atrium was mildly dilated.  ------------------------------------------------------------------- Pericardium:  There was no pericardial  effusion.  ------------------------------------------------------------------- Post procedure conclusions Ascending Aorta:  - Normal caliber aorta mild plaque.  ------------------------------------------------------------------- Measurements  Mitral valve                    Value Mitral regurg VTI, PISA         102   cm Mitral ERO, PISA                0.78  cm^2 Mitral regurg volume, PISA      80    ml  Legend: (L)  and  (H)  mark values outside specified reference range.  ------------------------------------------------------------------- Prepared and Electronically Authenticated by  Marca Ancona, M.D. 2020-01-29T15:39:48  MONITORS  LONG TERM MONITOR (3-14 DAYS) 07/09/2020  Narrative Patch Wear Time:  3 days and 1 hours (2021-12-22T17:11:14-0500 to 2021-12-25T18:51:40-0500)  Patient had a min HR of 45 bpm, max HR of 182 bpm, and avg HR of 68 bpm. Predominant underlying rhythm was Sinus Rhythm. Bundle Branch Block/IVCD was present. 1 run of Ventricular Tachycardia occurred lasting 4 beats with a max rate of 130 bpm (avg 114 bpm). 14 Supraventricular Tachycardia runs occurred, the run with the fastest interval lasting 5 beats with a max rate of 182 bpm, the longest lasting 20 beats with an avg rate of 105 bpm. Idioventricular Rhythm was present. Isolated SVEs were occasional (2.9%, 8418), SVE Couplets were rare (<1.0%, 765), and SVE Triplets were rare (<1.0%, 152). Isolated VEs were occasional (1.8%, 5298), VE Couplets were rare (<1.0%, 103),  and VE Triplets were rare (<1.0%, 9). Ventricular Bigeminy and Trigeminy were Present.  Study reviewed: sinus rhythm with average HR 68 bpm, occasional PVC's, rare supraventricular beats. There are rare supraventricular runs, but HR only mildly increased at 105 bpm during the longest run.       ______________________________________________________________________________________________      EKG:        Recent Labs: 08/29/2023: B  Natriuretic Peptide 417.0 08/30/2023: ALT 35 09/20/2023: BUN 24; Creatinine, Ser 0.94; Hemoglobin 14.1; Platelets 177; Potassium 4.6; Sodium 142  Recent Lipid Panel    Component Value Date/Time   CHOL 213 (H) 11/15/2022 1329   TRIG 215 (H) 11/15/2022 1329   HDL 42 11/15/2022 1329   CHOLHDL 5.1 (H) 11/15/2022 1329   CHOLHDL 3.8 04/22/2019 1759   VLDL 24 04/22/2019 1759   LDLCALC 133 (H) 11/15/2022 1329            Physical Exam:    VS:  BP 138/80   Pulse 63   Ht 6' (1.829 m)   Wt 239 lb (108.4 kg)   SpO2 95%   BMI 32.41 kg/m     Wt Readings from Last 3 Encounters:  09/20/23 239 lb (108.4 kg)  08/31/23 237 lb 3.4 oz (107.6 kg)  03/22/23 247 lb 12.8 oz (112.4 kg)     GEN:  Well nourished, well developed elderly male in no acute distress HEENT: Normal NECK: No JVD; No carotid bruits LYMPHATICS: No lymphadenopathy CARDIAC: RRR, 1/6 holosystolic murmur at the apex RESPIRATORY:  Clear to auscultation without rales, wheezing or rhonchi  ABDOMEN: Soft, non-tender, non-distended MUSCULOSKELETAL:  No edema; No deformity  SKIN: Warm and dry NEUROLOGIC:  Alert and oriented x 3 PSYCHIATRIC:  Normal affect   Assessment & Plan Nonrheumatic mitral (valve) insufficiency The patient has progressive symptoms of heart failure with recent hospitalization for acute on chronic heart failure with preserved ejection fraction.  He has clinically improved with diuresis and titration of medical therapy.  However, he appears to have moderately severe eccentric mitral regurgitation.  I think it is important and better define the severity and mechanism of his mitral regurgitation in the setting of worsening congestive heart failure symptoms.  I recommended transesophageal echo.  The patient understands the rationale for proceeding, the potential benefits of better defining the functional anatomy of his mitral valve, and the risks of the procedure.  He provides full informed consent to  proceed. Paroxysmal atrial fibrillation (HCC) Continue apixaban, tolerating without bleeding complications. Coronary artery disease involving native coronary artery of native heart without angina pectoris Stable without symptoms of angina.  No antiplatelet therapy in the context of oral anticoagulation Mixed hyperlipidemia Treated with ezetimibe, statin intolerant. Chronic heart failure with preserved ejection fraction (HCC) GDMT includes furosemide, Entresto, and carvedilol.  Recent echo reviewed as above. Pre-procedural cardiovascular examination        Informed Consent   Shared Decision Making/Informed Consent   The risks [esophageal damage, perforation (1:10,000 risk), bleeding, pharyngeal hematoma as well as other potential complications associated with conscious sedation including aspiration, arrhythmia, respiratory failure and death], benefits (treatment guidance and diagnostic support) and alternatives of a transesophageal echocardiogram were discussed in detail with Mr. Lewellen and he is willing to proceed.        Medication Adjustments/Labs and Tests Ordered: Current medicines are reviewed at length with the patient today.  Concerns regarding medicines are outlined above.  Orders Placed This Encounter  Procedures   CBC   Basic metabolic panel   No  orders of the defined types were placed in this encounter.   Patient Instructions  Your physician recommends that you continue on your current medications as directed. Please refer to the Current Medication list given to you today.   Lab Work: CBC, BMET today If you have labs (blood work) drawn today and your tests are completely normal, you will receive your results only by: MyChart Message (if you have MyChart) OR A paper copy in the mail If you have any lab test that is abnormal or we need to change your treatment, we will call you to review the results.  Testing/Procedures: Transesophageal Echocardiogram Your  physician has requested that you have a TEE. During a TEE, sound waves are used to create images of your heart. It provides your doctor with information about the size and shape of your heart and how well your heart's chambers and valves are working. In this test, a transducer is attached to the end of a flexible tube that's guided down your throat and into your esophagus (the tube leading from you mouth to your stomach) to get a more detailed image of your heart. You are not awake for the procedure. Please see the instruction sheet given to you today. For further information please visit [email or web address]      Dear Shawnie Pons  You are scheduled for a TEE (Transesophageal Echocardiogram) on Friday, March 21 with Dr. Jacques Navy.  Please arrive at the [facility] (Main Entrance A) at [facility]: [address] [place], [place] [address] at 12:00 PM (This time is 1 hour(s) before your procedure to ensure your preparation).   Free valet parking service is available. You will check in at ADMITTING.   *Please Note: You will receive a call the day before your procedure to confirm the appointment time. That time may have changed from the original time based on the schedule for that day.*   DIET:  Nothing to eat or drink after midnight except a sip of water with medications (see medication instructions below)  MEDICATION INSTRUCTIONS:   PLEASE HOLD YOUR FUROSEMIDE (LASIX) THE MORNING OF THIS PROCEDURE         :1}Continue taking your anticoagulant (blood thinner): Apixaban (Eliquis).  You will need to continue this after your procedure until you are told by your provider that it is safe to stop.    LABS:  Come to [facility]STA[facility] FIRST FLOOR TODAY FOR LABWORK--BMET AND CBC  FYI:  For your safety, and to allow [place] to monitor your vital signs accurately during the surgery/procedure we request: If you have artificial nails, gel coating, SNS etc, please have those removed prior to your  surgery/procedure. Not having the nail coverings /polish removed may result in cancellation or delay of your surgery/procedure.  Your support person will be asked to wait in the waiting room during your procedure.  It is OK to have someone drop you off and come back when you are ready to be discharged.  You cannot drive after the procedure and will need someone to drive you home.  Bring your insurance cards.  *Special Note: Every effort is made to have your procedure done on time. Occasionally there are emergencies that occur at the hospital that may cause delays. Please be patient if a delay does occur.       Follow-Up: At [facility], you and your health needs are our priority.  As part of our continuing mission to provide you with exceptional heart care, we have created  designated Provider Care Teams.  These Care Teams include your primary Cardiologist (physician) and Advanced Practice Providers (APPs -  Physician Assistants and Nurse Practitioners) who all work together to provide you with the care you need, when you need it.  We recommend signing up for the patient portal called "MyChart".  Sign up information is provided on this After Visit Summary.  MyChart is used to connect with patients for Virtual Visits (Telemedicine).  Patients are able to view lab/test results, encounter notes, upcoming appointments, etc.  Non-urgent messages can be sent to your provider as well.   To learn more about what you can do with MyChart, go to [email or web address].    Your next appointment:   6 month(s)  Provider:   APP     1st Floor: - Lobby - Registration  - Pharmacy  - Lab - Cafe  2nd Floor: - PV Lab - Diagnostic Testing (echo, CT, nuclear med)  3rd Floor: - Vacant  4th Floor: - TCTS (cardiothoracic surgery) - AFib Clinic - Structural Heart Clinic - Vascular Surgery  - Vascular Ultrasound  5th Floor: - HeartCare Cardiology (general and EP) - Clinical Pharmacy for  coumadin, hypertension, lipid, weight-loss medications, and med management appointments    Valet parking services will be available as well.     Signed, Tonny Bollman, MD  09/23/2023 7:46 AM    [facility]

## 2023-09-20 NOTE — Progress Notes (Unsigned)
 Cardiology Office Note:    Date:  09/23/2023   ID:  [name], DOB Mar 02, 1935, MRN [id number]  PCP:  [name], MD   [name] Cardiologist:  Tonny Bollman, MD Cardiology APP:  Beatrice Lecher, PA-C  Electrophysiologist:  [name], MD (Inactive)     Referring MD: [name], MD   Chief Complaint  Patient presents with   Shortness of Breath    History of Present Illness:    [name] is a 89 y.o. male with a hx ofL  Hx of syncope Vasovagal (Admx 10/2018) NSVT  (HFrEF) heart failure with reduced ejection fraction (imp to normal) Hx of tachy mediated CM in 12/19 (AF w RVR) >> EF returned to normal in NSR Non-ischemic cardiomyopathy  Echocardiogram 04/2019: EF 35-40 Cath w mod non-obs CAD Echocardiogram 5/22: EF 55-60  Coronary artery disease  Cath 04/2019: mod non-obstructive CAD  Persistent atrial fibrillation  CHA2DS2-VASc Score = 8 [CHF History: 1, HTN History: 1, Diabetes History: 1, Stroke History: 2, Vascular Disease History: 1, Age Score: 2, Gender Score: 0].    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 Excell Seltzer - med Rx if remains asymptomatic) Pulmonary HTN  Diabetes mellitus  Hypertension  Hyperlipidemia  Chronic kidney disease  Prior CVA  OSA  Prostate CA  Dementia  RBBB   The patient is here alone today.  He was recently hospitalized with acute on chronic heart failure after discontinuation of Entresto and presenting with hypertensive urgency.  States that his breathing has improved now after he received IV diuresis for an overnight hospitalization.  He is back on Entresto, furosemide, and carvedilol.  He reports compliance with his medications.  No orthopnea, PND, or leg swelling.  No chest pain or pressure.  An  echocardiogram performed in the hospital showed moderate to severe eccentric mitral regurgitation.  Current Medications: Current Meds  Medication Sig   albuterol (VENTOLIN HFA) 108 (90 Base) MCG/ACT inhaler 2 puffs every 6 (six) hours as needed for shortness of breath or wheezing.   Carboxymethylcellulose Sodium (LUBRICANT EYE DROPS OP) Place 1 drop into both eyes daily as needed (dry eyes).   carvedilol (COREG) 3.125 MG tablet Take 1 tablet (3.125 mg total) by mouth 2 (two) times daily with a meal.   ELIQUIS 5 MG TABS tablet TAKE 1 TABLET(5 MG) BY MOUTH TWICE DAILY   ezetimibe (ZETIA) 10 MG tablet TAKE 1 TABLET(10 MG) BY MOUTH DAILY   furosemide (LASIX) 20 MG tablet Take 1 tablet (20 mg total) by mouth daily.   sacubitril-valsartan (ENTRESTO) 24-26 MG Take 1 tablet by mouth 2 (two) times daily.     Allergies:   Aspirin   ROS:   Please see the history of present illness.    All other systems reviewed and are negative.  EKGs/Labs/Other Studies Reviewed:    The following studies were reviewed today: Cardiac Studies & Procedures   ______________________________________________________________________________________________ CARDIAC CATHETERIZATION  CARDIAC CATHETERIZATION 04/25/2019  Narrative  Hemodynamic findings consistent with mild pulmonary hypertension - mean PAP 28 mmHg  LV end diastolic pressure is mildly elevated. - LVEDP 12-15 mmHg  Cardiac output-index 7.44-3.27.  ---------------------------------  Ost LM to Mid LM lesion is 20% stenosed. Ost Cx lesion is 30% stenosed. -- calcified  1st Mrg lesion is 60% stenosed.  Dist LAD lesion is 55% stenosed with 55% stenosed side branch in 2nd Diag. - the downstream LAD is relatively the same diameter as noted in the stenosis.  Otherwise minimal to mild disease throughout.  SUMMARY  Diffuse mild to moderate CAD: Most notable 60% proximal OM1, 50 to 60% mid LAD at D2.  No obvious culprit lesion to explain significantly  reduced EF.  Mild proximal LM and ostial LCx-calcified lesions.  Mild pulmonary hypertension with mean PA pressure of 28 mmHg.  LVEDP and PCWP from 12 to 15 mmHg.  Borderline CO-CI  RECOMMENDATIONS  Return to nursing unit for ongoing care, TR band removal.  Would continue aggressive risk factor modification and heart failure medications.  Appears to be at relatively well diuresed, would titrate up afterload reduction  If he has any concerning symptoms for possible angina, would consider noninvasive evaluation with Myoview, or perhaps coronary CTA to determine presence of either anterior or lateral ischemia suggesting LAD or OM lesions are more significant than they appear angiographically.    Bryan Lemma, M.D., M.S. Interventional Cardiologist  Pager # [phone] Phone # [phone] [address] [place], [place] [address]  Findings Coronary Findings Diagnostic  Dominance: Right  Left Main Ost LM to Mid LM lesion is 20% stenosed. The lesion is concentric. The lesion is mildly calcified.  Left Anterior Descending There is mild diffuse disease throughout the vessel. The vessel is calcified. Proximal third of the vessel is mild to moderately calcified Dist LAD lesion is 55% stenosed with 55% stenosed side branch in 2nd Diag. The lesion is located at the major branch and eccentric. The downstream LAD beyond this appeared to be relatively small in diameter.  Lesion does not appear to be flow-limiting  First Diagonal Branch Vessel is small in size.  Second Diagonal Branch Vessel is small in size. There is mild disease in the vessel.  Second Septal Branch Vessel is small in size.  Left Circumflex There is mild diffuse disease throughout the vessel. Ost Cx lesion is 30% stenosed. Ostial The lesion is calcified. Prox Cx lesion is 25% stenosed. The lesion is focal. Prox Cx to Mid Cx lesion is 35% stenosed. The lesion is irregular.  First Obtuse Marginal  Branch Vessel is small in size. There is mild disease in the vessel. 1st Mrg lesion is 60% stenosed.  Left Posterior Atrioventricular Artery Vessel is small in size.  Right Coronary Artery Vessel is large. Actually very large The vessel exhibits minimal luminal irregularities.  Acute Marginal Branch Vessel is small in size.  Right Posterior Descending Artery Vessel is large in size. Wraparound PDA  First Right Posterolateral Branch Vessel is small in size.  Intervention  No interventions have been documented.   STRESS TESTS  MYOCARDIAL PERFUSION IMAGING 10/01/2021  Narrative   Findings are consistent with prior myocardial infarction and no peri-infarct ischemia. The study is intermediate risk due to depresesd ejection fraction.   No ST deviation was noted.   Left ventricular function is abnormal. Global function is moderately reduced. Nuclear stress EF: 41 %. The left ventricular ejection fraction is moderately decreased (30-44%). End diastolic cavity size is moderately enlarged.   Prior study not available for comparison.   ECHOCARDIOGRAM  ECHOCARDIOGRAM COMPLETE 08/30/2023  Narrative ECHOCARDIOGRAM REPORT    Patient Name:   [name] Date of Exam: 08/30/2023 Medical Rec #:  [id number]    Height:       72.0 in Accession #:    [id number]   Weight:  245.0 lb Date of Birth:  1934-08-25    BSA:          2.322 m Patient Age:    88 years     BP:           157/89 mmHg Patient Gender: M            HR:           77 bpm. Exam Location:  Inpatient  Procedure: 2D Echo, Cardiac Doppler and Color Doppler (Both Spectral and Color Flow Doppler were utilized during procedure).  Indications:    CHF-acute diastolic  History:        Patient has prior history of Echocardiogram examinations, most recent 10/18/2022. CHF, CAD, Stroke, Arrythmias:Atrial Fibrillation and RBBB, Signs/Symptoms:Fatigue; Risk Factors:Hypertension, Sleep Apnea, Former Smoker  and Dyslipidemia.  Sonographer:    Vern Claude Referring Phys: [id number] CAROLE N HALL  IMPRESSIONS   1. Left ventricular ejection fraction, by estimation, is 60 to 65%. The left ventricle has normal function. The left ventricle has no regional wall motion abnormalities. Left ventricular diastolic parameters are consistent with Grade II diastolic dysfunction (pseudonormalization). 2. Right ventricular systolic function is normal. The right ventricular size is normal. There is mildly elevated pulmonary artery systolic pressure. The estimated right ventricular systolic pressure is 39.7 mmHg. 3. Left atrial size was moderately dilated. 4. Eccentric posteriorly directed MR jet makes quantificaiton difficult but at least moderate. No pulmonary vein reversal. Appears similar to previous echocardiogram. The mitral valve is abnormal. Moderate to severe mitral valve regurgitation. No evidence of mitral stenosis. 5. The aortic valve is tricuspid. Aortic valve regurgitation is mild to moderate. No aortic stenosis is present. 6. The inferior vena cava is normal in size with greater than 50% respiratory variability, suggesting right atrial pressure of 3 mmHg.  FINDINGS Left Ventricle: Left ventricular ejection fraction, by estimation, is 60 to 65%. The left ventricle has normal function. The left ventricle has no regional wall motion abnormalities. Strain imaging was not performed. The left ventricular internal cavity size was normal in size. There is no left ventricular hypertrophy. Left ventricular diastolic parameters are consistent with Grade II diastolic dysfunction (pseudonormalization).  Right Ventricle: The right ventricular size is normal. No increase in right ventricular wall thickness. Right ventricular systolic function is normal. There is mildly elevated pulmonary artery systolic pressure. The tricuspid regurgitant velocity is 3.03 m/s, and with an assumed right atrial pressure of 3 mmHg, the  estimated right ventricular systolic pressure is 39.7 mmHg.  Left Atrium: Left atrial size was moderately dilated.  Right Atrium: Right atrial size was normal in size.  Pericardium: There is no evidence of pericardial effusion.  Mitral Valve: Eccentric posteriorly directed MR jet makes quantificaiton difficult but at least moderate. No pulmonary vein reversal. Appears similar to previous echocardiogram. The mitral valve is abnormal. Moderate to severe mitral valve regurgitation. No evidence of mitral valve stenosis. MV peak gradient, 1.3 mmHg. The mean mitral valve gradient is 1.0 mmHg.  Tricuspid Valve: The tricuspid valve is normal in structure. Tricuspid valve regurgitation is mild . No evidence of tricuspid stenosis.  Aortic Valve: The aortic valve is tricuspid. Aortic valve regurgitation is mild to moderate. Aortic regurgitation PHT measures 812 msec. No aortic stenosis is present. Aortic valve mean gradient measures 4.0 mmHg. Aortic valve peak gradient measures 6.7 mmHg. Aortic valve area, by VTI measures 2.26 cm.  Pulmonic Valve: The pulmonic valve was normal in structure. Pulmonic valve regurgitation is not visualized. No evidence of pulmonic stenosis.  Aorta: The aortic root is normal in size and structure.  Venous: The inferior vena cava is normal in size with greater than 50% respiratory variability, suggesting right atrial pressure of 3 mmHg.  IAS/Shunts: No atrial level shunt detected by color flow Doppler.  Additional Comments: 3D imaging was not performed.   LEFT VENTRICLE PLAX 2D LVIDd:         5.80 cm      Diastology LVIDs:         4.00 cm      LV e' medial:    3.24 cm/s LV PW:         1.20 cm      LV E/e' medial:  17.2 LV IVS:        1.20 cm      LV e' lateral:   5.35 cm/s LVOT diam:     2.20 cm      LV E/e' lateral: 10.4 LV SV:         48 LV SV Index:   21 LVOT Area:     3.80 cm  LV Volumes (MOD) LV vol d, MOD A2C: 287.0 ml LV vol d, MOD A4C: 278.0 ml LV  vol s, MOD A2C: 111.0 ml LV vol s, MOD A4C: 123.0 ml LV SV MOD A2C:     176.0 ml LV SV MOD A4C:     278.0 ml LV SV MOD BP:      171.1 ml  RIGHT VENTRICLE             IVC RV Basal diam:  4.30 cm     IVC diam: 1.60 cm RV Mid diam:    2.90 cm RV S prime:     15.50 cm/s TAPSE (M-mode): 4.0 cm  LEFT ATRIUM             Index        RIGHT ATRIUM           Index LA diam:        3.20 cm 1.38 cm/m   RA Area:     17.30 cm LA Vol (A2C):   69.5 ml 29.92 ml/m  RA Volume:   39.00 ml  16.79 ml/m LA Vol (A4C):   98.5 ml 42.41 ml/m LA Biplane Vol: 83.5 ml 35.95 ml/m AORTIC VALVE                    PULMONIC VALVE AV Area (Vmax):    2.15 cm     PV Vmax:       0.85 m/s AV Area (Vmean):   1.92 cm     PV Peak grad:  2.9 mmHg AV Area (VTI):     2.26 cm AV Vmax:           129.00 cm/s AV Vmean:          91.400 cm/s AV VTI:            0.212 m AV Peak Grad:      6.7 mmHg AV Mean Grad:      4.0 mmHg LVOT Vmax:         73.10 cm/s LVOT Vmean:        46.100 cm/s LVOT VTI:          0.126 m LVOT/AV VTI ratio: 0.59 AI PHT:            812 msec  AORTA Ao Root diam: 3.60 cm Ao Asc diam:  3.90 cm  MITRAL VALVE  TRICUSPID VALVE MV Area (PHT): 3.08 cm    TR Peak grad:   36.7 mmHg MV Area VTI:   2.90 cm    TR Vmax:        303.00 cm/s MV Peak grad:  1.3 mmHg MV Mean grad:  1.0 mmHg    SHUNTS MV Vmax:       0.56 m/s    Systemic VTI:  0.13 m MV Vmean:      41.6 cm/s   Systemic Diam: 2.20 cm MV Decel Time: 246 msec MR Peak grad: 88.5 mmHg MR Mean grad: 51.5 mmHg MR Vmax:      470.50 cm/s MR Vmean:     329.5 cm/s MV E velocity: 55.70 cm/s MV A velocity: 49.70 cm/s MV E/A ratio:  1.12  Clearnce Hasten Electronically signed by Clearnce Hasten Signature Date/Time: 08/30/2023/12:18:53 PM    Final   TEE  ECHO TEE 06/19/2018  Narrative *Fa[facility]* *[facility]* [address] [place], [place]  [address] [phone]  ------------------------------------------------------------------- Transesophageal Echocardiography  Patient:    Kenner, Lewan MR #:       [id number] Study Date: 06/19/2018 Gender:     M Age:        83 Height:     180.3 cm Weight:     117.8 kg BSA:        2.47 m^2 Pt. Status: Room:       6E29C  PERFORMING   Marca Ancona, M.D. ATTENDING    April Manson, Brittainy M REFERRING    [place], [place] M SONOGRAPHER  Sheralyn Boatman ADMITTING    Arrien, Mauricio  cc:  -------------------------------------------------------------------  ------------------------------------------------------------------- Indications:      Atrial fibrillation - 427.31. Severe valvular disease  ------------------------------------------------------------------- History:   PMH:  Elevated troponin. Cancer. RBBB.  Syncope. Stroke.  Primary pulmonary hypertension.  Risk factors: Hypertension. Diabetes mellitus. Dyslipidemia.  ------------------------------------------------------------------- Study Conclusions  - Left ventricle: Severe LV dilation with normal wall thickness. EF 25% with diffuse hypokinesis. Mildly dilated RV with moderate systolic dysfunction. - Aortic valve: There was no stenosis. There was mild regurgitation. - Aorta: Normal caliber aorta mild plaque. - Mitral valve: There was severe mitral regurgitation with anteriorly-directed jet. There appeared to be a ruptured chord and the anterior and posterior leaflets did not coapt completely with restriction of posterior leaflet. PISA ERO 0.78 cm^2. There was systolic flow reversal in the pulmonary vein doppler pattern. - Left atrium: The atrium was moderately dilated. No evidence of thrombus in the atrial cavity or appendage. No evidence of thrombus in the atrial cavity or appendage. - Right ventricle: The cavity size was mildly dilated. Systolic function was moderately reduced. - Right  atrium: The atrium was mildly dilated. - Atrial septum: Small PFO noted by color doppler. - Tricuspid valve: Mild tricuspid regurgitation, peak RV-RA gradient 24 mmHg.  Impressions:  - 1. No LA thrombus, may proceed to DCCV. 2. Severe LV dilation with EF 25%, diffuse hypokinesis. 3. Mild RV dilation with moderate systolic dysfunction. 4. Severe MR with restriction of posterior leaflet and evidence for a ruptured chord.  ------------------------------------------------------------------- Labs, prior tests, procedures, and surgery: ECG.     Abnormal.  ------------------------------------------------------------------- Study data:   Study status:  Routine.  Consent:  The risks, benefits, and alternatives to the procedure were explained to the patient and informed consent was obtained.  Procedure:  The patient reported no pain pre or post test. Initial setup. The patient was brought to  the laboratory. Surface ECG leads were monitored. Sedation. Conscious sedation was administered by anesthesiology staff. Transesophageal echocardiography. Topical anesthesia was obtained using viscous lidocaine. An adult multiplane transesophageal probe was inserted by the attending cardiologistwithout difficulty. 3D image quality was excellent. Study completion:  The patient tolerated the procedure well. There were no complications.  Administered medications:   Propofol, IV. Diagnostic transesophageal echocardiography.  2D and color Doppler.  Birthdate:  Patient birthdate: 05/14/1935.  Age:  Patient is 89 yr old.  Sex:  Gender: male.    BMI: 36.2 kg/m^2.  Blood pressure:     103/80  Patient status:  Inpatient.  Study date: Study date: 06/19/2018. Study time: 09:03 AM.  Location: Endoscopy.  -------------------------------------------------------------------  ------------------------------------------------------------------- Left ventricle:  Severe LV dilation with normal wall thickness. EF 25%  with diffuse hypokinesis. Mildly dilated RV with moderate systolic dysfunction.  ------------------------------------------------------------------- Aortic valve:   Trileaflet.  Doppler:   There was no stenosis. There was mild regurgitation.  ------------------------------------------------------------------- Aorta:  Normal caliber aorta mild plaque.  ------------------------------------------------------------------- Mitral valve:  There was severe mitral regurgitation with anteriorly-directed jet. There appeared to be a ruptured chord and the anterior and posterior leaflets did not coapt completely with restriction of posterior leaflet. PISA ERO 0.78 cm^2. There was systolic flow reversal in the pulmonary vein doppler pattern. Doppler:   There was no evidence for stenosis.  ------------------------------------------------------------------- Left atrium:  The atrium was moderately dilated.  No evidence of thrombus in the atrial cavity or appendage.  No evidence of thrombus in the atrial cavity or appendage.  ------------------------------------------------------------------- Atrial septum:  Small PFO noted by color doppler.  ------------------------------------------------------------------- Right ventricle:  The cavity size was mildly dilated. Systolic function was moderately reduced.  ------------------------------------------------------------------- Pulmonic valve:    Structurally normal valve.   Cusp separation was normal.  ------------------------------------------------------------------- Tricuspid valve:  Mild tricuspid regurgitation, peak RV-RA gradient 24 mmHg.  ------------------------------------------------------------------- Right atrium:  The atrium was mildly dilated.  ------------------------------------------------------------------- Pericardium:  There was no pericardial  effusion.  ------------------------------------------------------------------- Post procedure conclusions Ascending Aorta:  - Normal caliber aorta mild plaque.  ------------------------------------------------------------------- Measurements  Mitral valve                    Value Mitral regurg VTI, PISA         102   cm Mitral ERO, PISA                0.78  cm^2 Mitral regurg volume, PISA      80    ml  Legend: (L)  and  (H)  mark values outside specified reference range.  ------------------------------------------------------------------- Prepared and Electronically Authenticated by  Marca Ancona, M.D. 2020-01-29T15:39:48  MONITORS  LONG TERM MONITOR (3-14 DAYS) 07/09/2020  Narrative Patch Wear Time:  3 days and 1 hours (2021-12-22T17:11:14-0500 to 2021-12-25T18:51:40-0500)  Patient had a min HR of 45 bpm, max HR of 182 bpm, and avg HR of 68 bpm. Predominant underlying rhythm was Sinus Rhythm. Bundle Branch Block/IVCD was present. 1 run of Ventricular Tachycardia occurred lasting 4 beats with a max rate of 130 bpm (avg 114 bpm). 14 Supraventricular Tachycardia runs occurred, the run with the fastest interval lasting 5 beats with a max rate of 182 bpm, the longest lasting 20 beats with an avg rate of 105 bpm. Idioventricular Rhythm was present. Isolated SVEs were occasional (2.9%, 8418), SVE Couplets were rare (<1.0%, 765), and SVE Triplets were rare (<1.0%, 152). Isolated VEs were occasional (1.8%, 5298), VE Couplets were rare (<1.0%, 103),  and VE Triplets were rare (<1.0%, 9). Ventricular Bigeminy and Trigeminy were Present.  Study reviewed: sinus rhythm with average HR 68 bpm, occasional PVC's, rare supraventricular beats. There are rare supraventricular runs, but HR only mildly increased at 105 bpm during the longest run.       ______________________________________________________________________________________________      EKG:        Recent Labs: 08/29/2023: B  Natriuretic Peptide 417.0 08/30/2023: ALT 35 09/20/2023: BUN 24; Creatinine, Ser 0.94; Hemoglobin 14.1; Platelets 177; Potassium 4.6; Sodium 142  Recent Lipid Panel    Component Value Date/Time   CHOL 213 (H) 11/15/2022 1329   TRIG 215 (H) 11/15/2022 1329   HDL 42 11/15/2022 1329   CHOLHDL 5.1 (H) 11/15/2022 1329   CHOLHDL 3.8 04/22/2019 1759   VLDL 24 04/22/2019 1759   LDLCALC 133 (H) 11/15/2022 1329            Physical Exam:    VS:  BP 138/80   Pulse 63   Ht 6' (1.829 m)   Wt 239 lb (108.4 kg)   SpO2 95%   BMI 32.41 kg/m     Wt Readings from Last 3 Encounters:  09/20/23 239 lb (108.4 kg)  08/31/23 237 lb 3.4 oz (107.6 kg)  03/22/23 247 lb 12.8 oz (112.4 kg)     GEN:  Well nourished, well developed elderly male in no acute distress HEENT: Normal NECK: No JVD; No carotid bruits LYMPHATICS: No lymphadenopathy CARDIAC: RRR, 1/6 holosystolic murmur at the apex RESPIRATORY:  Clear to auscultation without rales, wheezing or rhonchi  ABDOMEN: Soft, non-tender, non-distended MUSCULOSKELETAL:  No edema; No deformity  SKIN: Warm and dry NEUROLOGIC:  Alert and oriented x 3 PSYCHIATRIC:  Normal affect   Assessment & Plan Nonrheumatic mitral (valve) insufficiency The patient has progressive symptoms of heart failure with recent hospitalization for acute on chronic heart failure with preserved ejection fraction.  He has clinically improved with diuresis and titration of medical therapy.  However, he appears to have moderately severe eccentric mitral regurgitation.  I think it is important and better define the severity and mechanism of his mitral regurgitation in the setting of worsening congestive heart failure symptoms.  I recommended transesophageal echo.  The patient understands the rationale for proceeding, the potential benefits of better defining the functional anatomy of his mitral valve, and the risks of the procedure.  He provides full informed consent to  proceed. Paroxysmal atrial fibrillation (HCC) Continue apixaban, tolerating without bleeding complications. Coronary artery disease involving native coronary artery of native heart without angina pectoris Stable without symptoms of angina.  No antiplatelet therapy in the context of oral anticoagulation Mixed hyperlipidemia Treated with ezetimibe, statin intolerant. Chronic heart failure with preserved ejection fraction (HCC) GDMT includes furosemide, Entresto, and carvedilol.  Recent echo reviewed as above. Pre-procedural cardiovascular examination        Informed Consent   Shared Decision Making/Informed Consent   The risks [esophageal damage, perforation (1:10,000 risk), bleeding, pharyngeal hematoma as well as other potential complications associated with conscious sedation including aspiration, arrhythmia, respiratory failure and death], benefits (treatment guidance and diagnostic support) and alternatives of a transesophageal echocardiogram were discussed in detail with Mr. Lewellen and he is willing to proceed.        Medication Adjustments/Labs and Tests Ordered: Current medicines are reviewed at length with the patient today.  Concerns regarding medicines are outlined above.  Orders Placed This Encounter  Procedures   CBC   Basic metabolic panel   No  orders of the defined types were placed in this encounter.   Patient Instructions  Your physician recommends that you continue on your current medications as directed. Please refer to the Current Medication list given to you today.   Lab Work: CBC, BMET today If you have labs (blood work) drawn today and your tests are completely normal, you will receive your results only by: MyChart Message (if you have MyChart) OR A paper copy in the mail If you have any lab test that is abnormal or we need to change your treatment, we will call you to review the results.  Testing/Procedures: Transesophageal Echocardiogram Your  physician has requested that you have a TEE. During a TEE, sound waves are used to create images of your heart. It provides your doctor with information about the size and shape of your heart and how well your heart's chambers and valves are working. In this test, a transducer is attached to the end of a flexible tube that's guided down your throat and into your esophagus (the tube leading from you mouth to your stomach) to get a more detailed image of your heart. You are not awake for the procedure. Please see the instruction sheet given to you today. For further information please visit [email or web address]      Dear Shawnie Pons  You are scheduled for a TEE (Transesophageal Echocardiogram) on Friday, March 21 with Dr. Jacques Navy.  Please arrive at the [facility] (Main Entrance A) at [facility]: [address] [place], [place] [address] at 12:00 PM (This time is 1 hour(s) before your procedure to ensure your preparation).   Free valet parking service is available. You will check in at ADMITTING.   *Please Note: You will receive a call the day before your procedure to confirm the appointment time. That time may have changed from the original time based on the schedule for that day.*   DIET:  Nothing to eat or drink after midnight except a sip of water with medications (see medication instructions below)  MEDICATION INSTRUCTIONS:   PLEASE HOLD YOUR FUROSEMIDE (LASIX) THE MORNING OF THIS PROCEDURE         :1}Continue taking your anticoagulant (blood thinner): Apixaban (Eliquis).  You will need to continue this after your procedure until you are told by your provider that it is safe to stop.    LABS:  Come to [facility] FIRST FLOOR TODAY FOR LABWORK--BMET AND CBC  FYI:  For your safety, and to allow [place] to monitor your vital signs accurately during the surgery/procedure we request: If you have artificial nails, gel coating, SNS etc, please have those removed prior to your  surgery/procedure. Not having the nail coverings /polish removed may result in cancellation or delay of your surgery/procedure.  Your support person will be asked to wait in the waiting room during your procedure.  It is OK to have someone drop you off and come back when you are ready to be discharged.  You cannot drive after the procedure and will need someone to drive you home.  Bring your insurance cards.  *Special Note: Every effort is made to have your procedure done on time. Occasionally there are emergencies that occur at the hospital that may cause delays. Please be patient if a delay does occur.       Follow-Up: At [facility], you and your health needs are our priority.  As part of our continuing mission to provide you with exceptional heart care, we have created  designated Provider Care Teams.  These Care Teams include your primary Cardiologist (physician) and Advanced Practice Providers (APPs -  Physician Assistants and Nurse Practitioners) who all work together to provide you with the care you need, when you need it.  We recommend signing up for the patient portal called "MyChart".  Sign up information is provided on this After Visit Summary.  MyChart is used to connect with patients for Virtual Visits (Telemedicine).  Patients are able to view lab/test results, encounter notes, upcoming appointments, etc.  Non-urgent messages can be sent to your provider as well.   To learn more about what you can do with MyChart, go to [email or web address].    Your next appointment:   6 month(s)  Provider:   APP     1st Floor: - Lobby - Registration  - Pharmacy  - Lab - Cafe  2nd Floor: - PV Lab - Diagnostic Testing (echo, CT, nuclear med)  3rd Floor: - Vacant  4th Floor: - TCTS (cardiothoracic surgery) - AFib Clinic - Structural Heart Clinic - Vascular Surgery  - Vascular Ultrasound  5th Floor: - HeartCare Cardiology (general and EP) - Clinical Pharmacy for  coumadin, hypertension, lipid, weight-loss medications, and med management appointments    Valet parking services will be available as well.     Signed, Tonny Bollman, MD  09/23/2023 7:46 AM    [facility]

## 2023-09-20 NOTE — Patient Instructions (Addendum)
 Your physician recommends that you continue on your current medications as directed. Please refer to the Current Medication list given to you today.   Lab Work: CBC, BMET today If you have labs (blood work) drawn today and your tests are completely normal, you will receive your results only by: MyChart Message (if you have MyChart) OR A paper copy in the mail If you have any lab test that is abnormal or we need to change your treatment, we will call you to review the results.  Testing/Procedures: Transesophageal Echocardiogram Your physician has requested that you have a TEE. During a TEE, sound waves are used to create images of your heart. It provides your doctor with information about the size and shape of your heart and how well your heart's chambers and valves are working. In this test, a transducer is attached to the end of a flexible tube that's guided down your throat and into your esophagus (the tube leading from you mouth to your stomach) to get a more detailed image of your heart. You are not awake for the procedure. Please see the instruction sheet given to you today. For further information please visit [email or web address]      Dear Shawnie Pons  You are scheduled for a TEE (Transesophageal Echocardiogram) on Friday, March 21 with Dr. Jacques Navy.  Please arrive at the [facility] (Main Entrance A) at [facility]: [address] [place], [place] [address] at 12:00 PM (This time is 1 hour(s) before your procedure to ensure your preparation).   Free valet parking service is available. You will check in at ADMITTING.   *Please Note: You will receive a call the day before your procedure to confirm the appointment time. That time may have changed from the original time based on the schedule for that day.*   DIET:  Nothing to eat or drink after midnight except a sip of water with medications (see medication instructions below)  MEDICATION INSTRUCTIONS:   PLEASE HOLD YOUR  FUROSEMIDE (LASIX) THE MORNING OF THIS PROCEDURE         :1}Continue taking your anticoagulant (blood thinner): Apixaban (Eliquis).  You will need to continue this after your procedure until you are told by your provider that it is safe to stop.    LABS:  Come to [facility] FIRST FLOOR TODAY FOR LABWORK--BMET AND CBC  FYI:  For your safety, and to allow [place] to monitor your vital signs accurately during the surgery/procedure we request: If you have artificial nails, gel coating, SNS etc, please have those removed prior to your surgery/procedure. Not having the nail coverings /polish removed may result in cancellation or delay of your surgery/procedure.  Your support person will be asked to wait in the waiting room during your procedure.  It is OK to have someone drop you off and come back when you are ready to be discharged.  You cannot drive after the procedure and will need someone to drive you home.  Bring your insurance cards.  *Special Note: Every effort is made to have your procedure done on time. Occasionally there are emergencies that occur at the hospital that may cause delays. Please be patient if a delay does occur.       Follow-Up: At [facility], you and your health needs are our priority.  As part of our continuing mission to provide you with exceptional heart care, we have created designated Provider Care Teams.  These Care Teams include your primary Cardiologist (physician) and Advanced Practice  Providers (APPs -  Physician Assistants and Nurse Practitioners) who all work together to provide you with the care you need, when you need it.  We recommend signing up for the patient portal called "MyChart".  Sign up information is provided on this After Visit Summary.  MyChart is used to connect with patients for Virtual Visits (Telemedicine).  Patients are able to view lab/test results, encounter notes, upcoming appointments, etc.  Non-urgent messages can be sent  to your provider as well.   To learn more about what you can do with MyChart, go to [email or web address].    Your next appointment:   6 month(s)  Provider:   APP     1st Floor: - Lobby - Registration  - Pharmacy  - Lab - Cafe  2nd Floor: - PV Lab - Diagnostic Testing (echo, CT, nuclear med)  3rd Floor: - Vacant  4th Floor: - TCTS (cardiothoracic surgery) - AFib Clinic - Structural Heart Clinic - Vascular Surgery  - Vascular Ultrasound  5th Floor: - HeartCare Cardiology (general and EP) - Clinical Pharmacy for coumadin, hypertension, lipid, weight-loss medications, and med management appointments    Valet parking services will be available as well.

## 2023-09-21 LAB — CBC
Hematocrit: 44 % (ref 37.5–51.0)
Hemoglobin: 14.1 g/dL (ref 13.0–17.7)
MCH: 29.2 pg (ref 26.6–33.0)
MCHC: 32 g/dL (ref 31.5–35.7)
MCV: 91 fL (ref 79–97)
Platelets: 177 10*3/uL (ref 150–450)
RBC: 4.83 x10E6/uL (ref 4.14–5.80)
RDW: 13.5 % (ref 11.6–15.4)
WBC: 6.1 10*3/uL (ref 3.4–10.8)

## 2023-09-21 LAB — BASIC METABOLIC PANEL
BUN/Creatinine Ratio: 26 — ABNORMAL HIGH (ref 10–24)
BUN: 24 mg/dL (ref 8–27)
CO2: 24 mmol/L (ref 20–29)
Calcium: 9 mg/dL (ref 8.6–10.2)
Chloride: 102 mmol/L (ref 96–106)
Creatinine, Ser: 0.94 mg/dL (ref 0.76–1.27)
Glucose: 164 mg/dL — ABNORMAL HIGH (ref 70–99)
Potassium: 4.6 mmol/L (ref 3.5–5.2)
Sodium: 142 mmol/L (ref 134–144)
eGFR: 79 mL/min/{1.73_m2} (ref >59)

## 2023-09-22 NOTE — Progress Notes (Signed)
 Unable to reach pt about procedure and voicemail box was full.

## 2023-09-23 ENCOUNTER — Other Ambulatory Visit: Payer: Self-pay

## 2023-09-23 ENCOUNTER — Ambulatory Visit (HOSPITAL_COMMUNITY)

## 2023-09-23 ENCOUNTER — Ambulatory Visit (HOSPITAL_COMMUNITY)
Admission: RE | Admit: 2023-09-23 | Discharge: 2023-09-23 | Disposition: A | Discharge status: Home / Self Care | Attending: Internal Medicine | Admitting: Internal Medicine

## 2023-09-23 ENCOUNTER — Encounter (HOSPITAL_COMMUNITY)
Admission: RE | Disposition: A | Discharge status: Home / Self Care | Payer: Self-pay | Source: Home / Self Care | Attending: Internal Medicine

## 2023-09-23 ENCOUNTER — Encounter: Payer: Self-pay | Admitting: Cardiovascular Disease

## 2023-09-23 DIAGNOSIS — I340 Nonrheumatic mitral (valve) insufficiency: Secondary | ICD-10-CM

## 2023-09-23 SURGERY — TRANSESOPHAGEAL ECHOCARDIOGRAM (TEE) (CATHLAB)
Anesthesia: Monitor Anesthesia Care

## 2023-09-23 MED ORDER — SODIUM CHLORIDE 0.9 % IV SOLN: INTRAVENOUS | Status: DC

## 2023-09-23 NOTE — Assessment & Plan Note (Signed)
 Continue apixaban, tolerating without bleeding complications.

## 2023-09-23 NOTE — Assessment & Plan Note (Signed)
 Treated with ezetimibe, statin intolerant.

## 2023-09-23 NOTE — Assessment & Plan Note (Signed)
 Stable without symptoms of angina.  No antiplatelet therapy in the context of oral anticoagulation

## 2023-09-26 ENCOUNTER — Encounter (HOSPITAL_COMMUNITY): Payer: Self-pay | Admitting: Cardiovascular Disease

## 2023-10-03 ENCOUNTER — Other Ambulatory Visit (HOSPITAL_COMMUNITY): Payer: Self-pay

## 2023-10-06 NOTE — Telephone Encounter (Signed)
error 

## 2023-10-13 NOTE — Progress Notes (Signed)
 Spoke to patient and instructed them to come at 08:45  and to be NPO after 0000. Medications reviewed.   Confirmed that patient will have a ride home and someone to stay with them for 24 hours after the procedure.

## 2023-10-13 NOTE — Anesthesia Preprocedure Evaluation (Signed)
 Anesthesia Evaluation  Patient identified by MRN, date of birth, ID band Patient awake    Reviewed: Allergy & Precautions, NPO status , Patient's Chart, lab work & pertinent test results  History of Anesthesia Complications Negative for: history of anesthetic complications  Airway Mallampati: I  TM Distance: >3 FB Neck ROM: Full   Comment: Previous grade I view with MAC 4, easy mask Dental  (+) Partial Upper   Pulmonary neg shortness of breath, sleep apnea , neg COPD, neg recent URI, Patient abstained from smoking., former smoker   Pulmonary exam normal breath sounds clear to auscultation       Cardiovascular METS: carvedilol. hypertension, Pt. on home beta blockers pulmonary hypertension(-) angina + CAD and +CHF  (-) Past MI and (-) Cardiac Stents + dysrhythmias (RBBB, PVCs) Atrial Fibrillation + Valvular Problems/Murmurs AI and MR  Rhythm:Regular Rate:Normal  Ascending aorta dilation, s/p abdominoaortic endovascular stent graft  TTE 08/30/2023: IMPRESSIONS    1. Left ventricular ejection fraction, by estimation, is 60 to 65%. The  left ventricle has normal function. The left ventricle has no regional  wall motion abnormalities. Left ventricular diastolic parameters are  consistent with Grade II diastolic  dysfunction (pseudonormalization).   2. Right ventricular systolic function is normal. The right ventricular  size is normal. There is mildly elevated pulmonary artery systolic  pressure. The estimated right ventricular systolic pressure is 39.7 mmHg.   3. Left atrial size was moderately dilated.   4. Eccentric posteriorly directed MR jet makes quantificaiton difficult  but at least moderate. No pulmonary vein reversal. Appears similar to  previous echocardiogram. The mitral valve is abnormal. Moderate to severe  mitral valve regurgitation. No  evidence of mitral stenosis.   5. The aortic valve is tricuspid. Aortic valve  regurgitation is mild to  moderate. No aortic stenosis is present.   6. The inferior vena cava is normal in size with greater than 50%  respiratory variability, suggesting right atrial pressure of 3 mmHg.     Neuro/Psych  Headaches, neg Seizures CVA, No Residual Symptoms    GI/Hepatic Neg liver ROS, PUD,GERD  ,,  Endo/Other  diabetes    Renal/GU CRFRenal disease   Prostate cancer    Musculoskeletal Osteopenia    Abdominal  (+) + obese  Peds  Hematology negative hematology ROS (+) Lab Results      Component                Value               Date                      WBC                      6.1                 09/20/2023                HGB                      14.1                09/20/2023                HCT                      44.0  09/20/2023                MCV                      91                  09/20/2023                PLT                      177                 09/20/2023              Anesthesia Other Findings Last Eliquis: yesterday  Reproductive/Obstetrics                             Anesthesia Physical Anesthesia Plan  ASA: 3  Anesthesia Plan: MAC   Post-op Pain Management:    Induction: Intravenous  PONV Risk Score and Plan: 1 and Propofol infusion, TIVA and Treatment may vary due to age or medical condition  Airway Management Planned: Natural Airway and Nasal Cannula  Additional Equipment:   Intra-op Plan:   Post-operative Plan:   Informed Consent: I have reviewed the patients History and Physical, chart, labs and discussed the procedure including the risks, benefits and alternatives for the proposed anesthesia with the patient or authorized representative who has indicated his/her understanding and acceptance.     Dental advisory given  Plan Discussed with: CRNA and Anesthesiologist  Anesthesia Plan Comments: (Discussed with patient risks of MAC including, but not limited to, minor pain or  discomfort, hearing people in the room, and possible need for backup general anesthesia. Risks for general anesthesia also discussed including, but not limited to, sore throat, hoarse voice, chipped/damaged teeth, injury to vocal cords, nausea and vomiting, allergic reactions, lung infection, heart attack, stroke, and death. All questions answered. )        Anesthesia Quick Evaluation

## 2023-10-14 ENCOUNTER — Ambulatory Visit (HOSPITAL_COMMUNITY)
Admission: RE | Admit: 2023-10-14 | Discharge: 2023-10-14 | Disposition: A | Discharge status: Home / Self Care | Source: Ambulatory Visit | Attending: Cardiovascular Disease | Admitting: Cardiovascular Disease

## 2023-10-14 ENCOUNTER — Encounter (HOSPITAL_COMMUNITY)
Admission: RE | Disposition: A | Discharge status: Home / Self Care | Payer: Self-pay | Source: Home / Self Care | Attending: Cardiovascular Disease

## 2023-10-14 ENCOUNTER — Ambulatory Visit (HOSPITAL_COMMUNITY): Admitting: Anesthesiology

## 2023-10-14 ENCOUNTER — Ambulatory Visit (HOSPITAL_COMMUNITY)
Admission: RE | Admit: 2023-10-14 | Discharge: 2023-10-14 | Disposition: A | Discharge status: Home / Self Care | Attending: Cardiovascular Disease | Admitting: Cardiovascular Disease

## 2023-10-14 ENCOUNTER — Other Ambulatory Visit: Payer: Self-pay

## 2023-10-14 ENCOUNTER — Encounter (HOSPITAL_COMMUNITY): Payer: Self-pay | Admitting: Cardiovascular Disease

## 2023-10-14 DIAGNOSIS — N1831 Chronic kidney disease, stage 3a: Secondary | ICD-10-CM | POA: Diagnosis not present

## 2023-10-14 DIAGNOSIS — I2510 Atherosclerotic heart disease of native coronary artery without angina pectoris: Secondary | ICD-10-CM | POA: Diagnosis not present

## 2023-10-14 DIAGNOSIS — E1122 Type 2 diabetes mellitus with diabetic chronic kidney disease: Secondary | ICD-10-CM | POA: Insufficient documentation

## 2023-10-14 DIAGNOSIS — I130 Hypertensive heart and chronic kidney disease with heart failure and stage 1 through stage 4 chronic kidney disease, or unspecified chronic kidney disease: Secondary | ICD-10-CM

## 2023-10-14 DIAGNOSIS — Z7901 Long term (current) use of anticoagulants: Secondary | ICD-10-CM | POA: Diagnosis not present

## 2023-10-14 DIAGNOSIS — Z79899 Other long term (current) drug therapy: Secondary | ICD-10-CM | POA: Insufficient documentation

## 2023-10-14 DIAGNOSIS — Q2112 Patent foramen ovale: Secondary | ICD-10-CM | POA: Diagnosis not present

## 2023-10-14 DIAGNOSIS — I351 Nonrheumatic aortic (valve) insufficiency: Secondary | ICD-10-CM

## 2023-10-14 DIAGNOSIS — I5042 Chronic combined systolic (congestive) and diastolic (congestive) heart failure: Secondary | ICD-10-CM | POA: Insufficient documentation

## 2023-10-14 DIAGNOSIS — I080 Rheumatic disorders of both mitral and aortic valves: Secondary | ICD-10-CM | POA: Insufficient documentation

## 2023-10-14 DIAGNOSIS — I4819 Other persistent atrial fibrillation: Secondary | ICD-10-CM | POA: Insufficient documentation

## 2023-10-14 DIAGNOSIS — Z8546 Personal history of malignant neoplasm of prostate: Secondary | ICD-10-CM | POA: Diagnosis not present

## 2023-10-14 DIAGNOSIS — I340 Nonrheumatic mitral (valve) insufficiency: Secondary | ICD-10-CM

## 2023-10-14 DIAGNOSIS — Z87891 Personal history of nicotine dependence: Secondary | ICD-10-CM | POA: Diagnosis not present

## 2023-10-14 DIAGNOSIS — I361 Nonrheumatic tricuspid (valve) insufficiency: Secondary | ICD-10-CM | POA: Diagnosis not present

## 2023-10-14 DIAGNOSIS — N189 Chronic kidney disease, unspecified: Secondary | ICD-10-CM | POA: Diagnosis not present

## 2023-10-14 DIAGNOSIS — I5023 Acute on chronic systolic (congestive) heart failure: Secondary | ICD-10-CM

## 2023-10-14 DIAGNOSIS — I428 Other cardiomyopathies: Secondary | ICD-10-CM | POA: Insufficient documentation

## 2023-10-14 DIAGNOSIS — E782 Mixed hyperlipidemia: Secondary | ICD-10-CM | POA: Diagnosis not present

## 2023-10-14 HISTORY — PX: TRANSESOPHAGEAL ECHOCARDIOGRAM (CATH LAB): EP1270

## 2023-10-14 LAB — ECHO TEE
MV M vel: 5.06 m/s
MV Peak grad: 102.5 mmHg
Radius: 0.82 cm

## 2023-10-14 SURGERY — TRANSESOPHAGEAL ECHOCARDIOGRAM (TEE) (CATHLAB)
Anesthesia: Monitor Anesthesia Care

## 2023-10-14 MED ORDER — LIDOCAINE 2% (20 MG/ML) 5 ML SYRINGE
INTRAMUSCULAR | Status: DC | PRN
Start: 2023-10-14 — End: 2023-10-14
  Administered 2023-10-14: 40 mg via INTRAVENOUS

## 2023-10-14 MED ORDER — PROPOFOL 500 MG/50ML IV EMUL
INTRAVENOUS | Status: DC | PRN
  Administered 2023-10-14: 35 ug/kg/min via INTRAVENOUS

## 2023-10-14 MED ORDER — PROPOFOL 10 MG/ML IV BOLUS
INTRAVENOUS | Status: DC | PRN
  Administered 2023-10-14: 60 mg via INTRAVENOUS

## 2023-10-14 MED ORDER — SODIUM CHLORIDE 0.9 % IV SOLN: INTRAVENOUS | Status: DC

## 2023-10-14 MED ORDER — ONDANSETRON HCL 4 MG/2ML IJ SOLN
INTRAMUSCULAR | Status: DC | PRN
  Administered 2023-10-14: 4 mg via INTRAVENOUS

## 2023-10-14 NOTE — Anesthesia Postprocedure Evaluation (Signed)
 Anesthesia Post Note  Patient: [name]  Procedure(s) Performed: TRANSESOPHAGEAL ECHOCARDIOGRAM     Patient location during evaluation: PACU Anesthesia Type: MAC Level of consciousness: awake Pain management: pain level controlled Vital Signs Assessment: post-procedure vital signs reviewed and stable Respiratory status: spontaneous breathing, nonlabored ventilation and respiratory function stable Cardiovascular status: stable and blood pressure returned to baseline Postop Assessment: no apparent nausea or vomiting Anesthetic complications: no   No notable events documented.  Last Vitals:  Vitals:   10/14/23 1000 10/14/23 1010  BP: (!) 117/56 (!) 125/108  Pulse: (!) 36 (!) 44  Resp: (!) 22 14  Temp:    SpO2: 95% 92%    Last Pain:  Vitals:   10/14/23 0939  TempSrc: Temporal                 [name]

## 2023-10-14 NOTE — Op Note (Signed)
 INDICATIONS: Mitral insufficiency  PROCEDURE:   Informed consent was obtained prior to the procedure. The risks, benefits and alternatives for the procedure were discussed and the patient comprehended these risks.  Risks include, but are not limited to, cough, sore throat, vomiting, nausea, somnolence, esophageal and stomach trauma or perforation, bleeding, low blood pressure, aspiration, pneumonia, infection, trauma to the teeth and death.    After a procedural time-out, the oropharynx was anesthetized with 20% benzocaine spray.   During this procedure the patient was administered IV propofol by Anesthesiology, Dr. Freida Busman.  The transesophageal probe was inserted in the esophagus and stomach without difficulty and multiple views were obtained.  The patient was kept under observation until the patient left the procedure room.  The patient left the procedure room in stable condition.   Agitated microbubble saline contrast was not administered.  COMPLICATIONS:    There were no immediate complications.  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 The left atrium is severely dilated. There is no systolic flow reversal in the pulmonary veins. There is moderate pulmonary HTN.  RECOMMENDATIONS:    Difficult to quantify the mitral insufficiency due to extremely eccentric jet. MR may be severe based on the presence of pulmonary HTN. The ruptured chorda to the P1 scallop is located relatively far form the lateral commissure. The posterior leaflet has sufficient length and the mitral valve area is large. Appears anatomically to allow TEER (MitraClip).  Consider right heart catheterization to confirm MR as cause of PAH.  Time Spent Directly with the Patient:  30 minutes   [name] 10/14/2023, 9:31 AM

## 2023-10-14 NOTE — Transfer of Care (Signed)
 Immediate Anesthesia Transfer of Care Note  Patient: [name]  Procedure(s) Performed: TRANSESOPHAGEAL ECHOCARDIOGRAM  Patient Location: PACU  Anesthesia Type:MAC  Level of Consciousness: awake  Airway & Oxygen Therapy: Patient Spontanous Breathing  Post-op Assessment: Report given to RN  Post vital signs: Reviewed and stable  Last Vitals:  Vitals Value Taken Time  BP 105/58 10/14/23 0939  Temp 36.4 C 10/14/23 0939  Pulse 44 10/14/23 0940  Resp 20 10/14/23 0940  SpO2 95 % 10/14/23 0940  Vitals shown include unfiled device data.  Last Pain:  Vitals:   10/14/23 0939  TempSrc: Temporal         Complications: No notable events documented.

## 2023-10-14 NOTE — Interval H&P Note (Signed)
 History and Physical Interval Note:  10/14/2023 9:09 AM  [name]  has presented today for surgery, with the diagnosis of MR.  The various methods of treatment have been discussed with the patient and family. After consideration of risks, benefits and other options for treatment, the patient has consented to  Procedure(s): TRANSESOPHAGEAL ECHOCARDIOGRAM (N/A) as a surgical intervention.  The patient's history has been reviewed, patient examined, no change in status, stable for surgery.  I have reviewed the patient's chart and labs.  Questions were answered to the patient's satisfaction.     Lakisa Lotz

## 2023-10-27 ENCOUNTER — Telehealth: Payer: Self-pay | Admitting: Cardiovascular Disease

## 2023-10-27 NOTE — Telephone Encounter (Signed)
 Spoke with patient and he is aware provider has not finalized results. Once results has been finalized we will give him a call

## 2023-10-27 NOTE — Telephone Encounter (Signed)
 Pt calling to get TEE results

## 2023-10-27 NOTE — Telephone Encounter (Signed)
 Thanks - I don't think I saw the result of the TEE when it was done. We should review at our next heart valve team meeting and discuss further management which should likely include R/L heart catheterization.

## 2023-10-27 NOTE — Telephone Encounter (Signed)
 Will route to Structural Team to review.  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 The left atrium is severely dilated. There is no systolic flow reversal in the pulmonary veins. There is moderate pulmonary HTN. RECOMMENDATIONS:   Difficult to quantify the mitral insufficiency due to extremely eccentric jet. MR may be severe based on the presence of pulmonary HTN. The ruptured chorda to the P1 scallop is located relatively far form the lateral commissure. The posterior leaflet has sufficient length and the mitral valve area is large. Appears anatomically to allow TEER (MitraClip).  Consider right heart catheterization to confirm MR as cause of PAH.

## 2023-10-28 ENCOUNTER — Other Ambulatory Visit: Payer: Self-pay | Admitting: Physician Assistant

## 2023-11-01 NOTE — Telephone Encounter (Signed)
 Pt's TEE reviewed in Multidisciplinary Heart Valve Team meeting today.  Team felt the pt had anatomy for mTEER and recommended continuing with evaluation.  Appointment arranged with Jeronimo Moors PA-C on 5/5 at 11:35 AM to discuss scheduling cardiac catheterization.

## 2023-11-06 NOTE — H&P (View-Only) (Signed)
 [facility]   MULTIDISCIPLINARY HEART VALVE CLINIC                                     Cardiology Office Note:    Date:  11/07/2023   ID:  Sammer Scafidi, DOB February 14, 1935, MRN [id number]  PCP:  Margarete Sharps, MD  [facility] Cardiologist:  Arnoldo Lapping, MD  [facility] Structural heart: Arnoldo Lapping, MD [facility] Electrophysiologist:  Jolly Needle, MD (Inactive)   Referring MD: Margarete Sharps, MD   Follow up - severe MR. Set up heart cath.   History of Present Illness:    Jeret Berl is a 89 y.o. 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   He was admitted 2/24-08/11/23 with acute on chronic heart failure after discontinuation of Entresto  and presenting with hypertensive urgency. Echo at that time showed EF 60% with mod-severe MR. 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 This was reviewed at our Multidisciplinary Heart Valve Team meeting and pt felt to have amendable anatomy for mTEER and recommended continuing with evaluation.   Today the patient presents to clinic for follow up and to discuss setting up a L/RHC. Here today alone. He continues to have shortness of breath. He notices it when going up stairs. 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 He wants to be able to work on his place but he hasn't had the breath to keep up. No CP or SOB. No LE edema, orthopnea or PND. No dizziness or syncope. No blood in stool or urine. No palpitations. Neighbor said he will pick him up  after heart cath.    Past Medical History:  Diagnosis Date   (HFimpEF) heart failure with improved EF    Echo 04/2019: EF 35-40 // Echo 5/22: EF 55-60, no RWMA, GR 1 DD, moderate to severe MR, myxomatous mitral valve, similar to prior echocardiogram, mildly elevated PASP, RVSP 35.9, mild AI, mild AV sclerosis without AS, mild dilation of ascending aorta (43 mm)   A-fib (HCC)    RVR-DCCV   Abnormal PSA 01/17/2012   10.38/4/5/ PSA:2013=6.96/ PSA 07/08/2011=6.26/ PSA 2006=8.6   Aneurysm (HCC)    MILD 2.7 CM FUSIFORM llIAC ANEURYSM   Aortic insufficiency 10/01/2021   Echocardiogram March 2023: mild to mod AI   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/16/2021   Echocardiogram March 2023: 41 mm   Blood transfusion without reported diagnosis    during vagotomy 1976   Cancer (HCC) 2006   L-2 solitary metastatic lesion/no rad tx ,asymptomatic   Chronic kidney disease    nephrolithiasis right kidney   Coronary artery disease    ED (erectile dysfunction)    GERD (gastroesophageal reflux disease)    PUD S/P PARTIAL VAGOTOMY   Gynecomastia 04/21/2011   seen by Dr.Murray, no rad tx   H/O impacted cerumen    S/P ENT DR BATES   History of echocardiogram    echo 4/16:  mild LVH, EF 55-60%,  Gr 1 DD, no RWMA, mild AI, mild MR, mod LAE, mild TR   Hx of cardiovascular stress test    Myoview  5/16:  EF 50%, no scar or ischemia, low risk   Hydroureteronephrosis    right    Hyperglycemia    WITHOUT DM2   Hyperlipidemia    Hypertension    Insomnia    Migraines    WITH AURA   Mitral regurgitation 08/10/2018   Echo 11/04/20: EF 55-60, no RWMA, Gr 1 DD, mod to severe MR, normal RVSF, RVSP 35.9, mild LAE, mild AI, AV sclerosis w/o AS, ascending aorta 43 mm (mild dilation) Echo 3/23: EF 60-65, no RWMA, mild LVH, GR 1 DD, normal RVSF, mildly elevated PASP, moderate LAE, moderate MR, mild to moderate AI, dilated ascending aorta (41 mm), dilated aortic root (40 mm), RVSP 36.4   Nephrolithiasis    Osteopenia     Overweight    Prostate cancer (HCC) 12/09/03 dx   Prostate ca/adenocarcinoma,gleason=3+3=6    pSA 4.6   Prostate cancer (HCC)    METASTATIC WITH L2 SCLEROTIC LESION ON LUPRON------DR. WOODRUFF   RBBB (right bundle branch block)    Rosacea    Sinus bradycardia    Sleep apnea    pt unaware, no sleep study   Stroke [facility])    asymptomatic, found on head imaging   Ulcer    peptic   Use of leuprolide acetate (Lupron) 06/2005-11/2006   02/2008 degarelix 02/2008 x 3 doses     Current Medications: Current Meds  Medication Sig   albuterol  (VENTOLIN  HFA) 108 (90 Base) MCG/ACT inhaler 2 puffs every 6 (six) hours as needed for shortness of breath or wheezing.   Carboxymethylcellulose Sodium (LUBRICANT EYE DROPS OP) Place 1 drop into both eyes daily as needed (dry eyes).   ezetimibe  (ZETIA ) 10 MG tablet TAKE 1 TABLET(10 MG) BY MOUTH DAILY   sacubitril -valsartan  (ENTRESTO ) 24-26 MG Take 1 tablet by mouth 2 (two) times daily.   [DISCONTINUED] carvedilol  (COREG ) 3.125 MG tablet Take 1 tablet (3.125 mg total) by mouth 2 (two) times daily with a meal.   [DISCONTINUED] ELIQUIS  5 MG TABS tablet TAKE 1 TABLET(5 MG) BY MOUTH TWICE DAILY   [DISCONTINUED] furosemide  (LASIX ) 20 MG tablet TAKE 1 TABLET BY MOUTH EVERY DAY      ROS:   Please see the history of present illness.    All other systems reviewed and are negative.  EKGs       Risk Assessment/Calculations:    CHA2DS2-VASc Score = 7   This indicates a 11.2% annual risk of stroke. The patient's score is based upon: CHF History: 1 HTN History: 1 Diabetes History: 1 Stroke History: 2 Vascular Disease History: 0 Age Score: 2 Gender Score: 0          Physical Exam:    VS:  BP (!) 164/88   Pulse 85   Ht 6' (1.829 m)   Wt 241 lb (109.3 kg)   SpO2 95%   BMI 32.69 kg/m     Wt Readings from Last 3 Encounters:  11/07/23 241 lb (109.3 kg)  10/14/23 240 lb (108.9 kg)  09/20/23 239 lb (108.4 kg)     GEN: Well nourished, well  developed in no acute distress NECK: No JVD CARDIAC: RRR, 2/6 holosystolic murmur at apex. No rubs, gallops RESPIRATORY:  Clear to auscultation without rales, wheezing or rhonchi  ABDOMEN: Soft, non-tender, non-distended EXTREMITIES:  No edema; No deformity.    ASSESSMENT:    1. Mitral  valve insufficiency, unspecified etiology   2. Essential hypertension   3. Paroxysmal atrial fibrillation (HCC)     PLAN:    In order of problems listed above:  Severe MR: -- TEE 10/14/23 showed EF 45-50% with mild LV dilation, substantial left ventricular contractile dyssynchrony, mod pulm HTN, severe LAE, and mod to severe MR with a highly eccentric jet and ruptured chorda tendina to the posterior mitral leaflet with flail motion of the lateral (P1) scallop.  -- TEE reviewed at Valve Team meeting and pt felt to have amendable anatomy for mTEER and recommended continuing with evaluation.  -- Set up for [facility] on 11/14/23 with Dr. Arlester Ladd.  -- BMET/CBC today. -- ECG not done today, will do day of cath.   Informed Consent   Shared Decision Making/Informed Consent The risks [stroke (1 in 1000), death (1 in 1000), kidney failure [usually temporary] (1 in 500), bleeding (1 in 200), allergic reaction [possibly serious] (1 in 200)], benefits (diagnostic support and management of coronary artery disease) and alternatives of a cardiac catheterization were discussed in detail with Mr. Drapeau and he is willing to proceed.     PAF: -- Sounds regular on exam.  -- Continue Eliquis  5mg  BID.  -- Will hold for heart cath.   HTN: -- BP elevated.  -- Continue Entresto  24-26 mg BID.  -- Continue Lasix  20mg  daily. -- Not taking Coreg  3.125mg  BID for unclear reasons, I have called this back in for him.   HFmEF: -- EF 45-50% with substantial left ventricular contractile dyssynchrony, even though the ECG does not show a typical LBBB on TEE.  -- Appears euvolemic.  -- Continue Entresto  24-26 mg BID.  -- Continue Lasix   20mg  daily. -- Not taking Coreg  3.125mg  BID for unclear reasons, I have called this back in for him.   CAD: -- No angina. -- No antiplatelet given need for OAC.  -- Statin intolerant.  -- Continue Zetia  10mg  daily.     Informed Consent   Shared Decision Making/Informed Consent The risks [stroke (1 in 1000), death (1 in 1000), kidney failure [usually temporary] (1 in 500), bleeding (1 in 200), allergic reaction [possibly serious] (1 in 200)], benefits (diagnostic support and management of coronary artery disease) and alternatives of a cardiac catheterization were discussed in detail with Mr. Kirley and he is willing to proceed.        Medication Adjustments/Labs and Tests Ordered: Current medicines are reviewed at length with the patient today.  Concerns regarding medicines are outlined above.  Orders Placed This Encounter  Procedures   Basic metabolic panel with GFR   CBC   Meds ordered this encounter  Medications   furosemide  (LASIX ) 20 MG tablet    Sig: Take 1 tablet (20 mg total) by mouth daily.    Dispense:  90 tablet    Refill:  3   apixaban  (ELIQUIS ) 5 MG TABS tablet    Sig: Take 1 tablet (5 mg total) by mouth 2 (two) times daily.    Dispense:  180 tablet    Refill:  2   carvedilol  (COREG ) 3.125 MG tablet    Sig: Take 1 tablet (3.125 mg total) by mouth 2 (two) times daily with a meal.    Dispense:  180 tablet    Refill:  3    Patient Instructions  Medication Instructions:  Your physician recommends that you continue on your current medications as directed. Please refer to the Current Medication list given to you today.  *If you  need a refill on your cardiac medications before your next appointment, please call your pharmacy*  Lab Work: BMET, CBC If you have labs (blood work) drawn today and your tests are completely normal, you will receive your results only by: MyChart Message (if you have MyChart) OR A paper copy in the mail If you have any lab test that is  abnormal or we need to change your treatment, we will call you to review the results.  Testing/Procedures: Your physician has requested that you have a cardiac catheterization. Cardiac catheterization is used to diagnose and/or treat various heart conditions. Doctors may recommend this procedure for a number of different reasons. The most common reason is to evaluate chest pain. Chest pain can be a symptom of coronary artery disease (CAD), and cardiac catheterization can show whether plaque is narrowing or blocking your heart's arteries. This procedure is also used to evaluate the valves, as well as measure the blood flow and oxygen levels in different parts of your heart. For further information please visit [email or web address] Please follow instruction sheet, as given.  Follow-Up: At [facility], you and your health needs are our priority.  As part of our continuing mission to provide you with exceptional heart care, our providers are all part of one team.  This team includes your primary Cardiologist (physician) and Advanced Practice Providers or APPs (Physician Assistants and Nurse Practitioners) who all work together to provide you with the care you need, when you need it.  Your next appointment:   STRUCTURAL HEART TEAM WILL CALL YOU WITH A SCHEDULED APPOINTMENT    We recommend signing up for the patient portal called "MyChart".  Sign up information is provided on this After Visit Summary.  MyChart is used to connect with patients for Virtual Visits (Telemedicine).  Patients are able to view lab/test results, encounter notes, upcoming appointments, etc.  Non-urgent messages can be sent to your provider as well.   To learn more about what you can do with MyChart, go to [email or web address].   Other Instructions  [place] [facility] A DEPT OF [place] AT [address] A DEPT OF THE [place] [address] [place] [address] Dept:  [phone] Loc: [phone]  Isaiahs Mcclenton  11/07/2023  You are scheduled for a Cardiac Catheterization on Monday, May 12 with Dr. Arnoldo Lapping.  1. Please arrive at the [facility] (Main Entrance A) at [facility]: [address] [place], [place] [address] at 10:00 AM (This time is 2 hour(s) before your procedure to ensure your preparation).   Free valet parking service is available. You will check in at ADMITTING. The support person will be asked to wait in the waiting room.  It is OK to have someone drop you off and come back when you are ready to be discharged.    Special note: Every effort is made to have your procedure done on time. Please understand that emergencies sometimes delay scheduled procedures.  2. Diet: Do not eat solid foods after midnight.  The patient may have clear liquids until 5am upon the day of the procedure.  3. Labs: TODAY  4. Medication instructions in preparation for your procedure:   Contrast Allergy: No  Stop taking Eliquis  (Apixiban) on Saturday, May 10.   On the morning of your procedure, take your Aspirin  81 mg and any morning medicines NOT listed above.  You may use sips of water.  5. Plan to go home  the same day, you will only stay overnight if medically necessary. 6. Bring a current list of your medications and current insurance cards. 7. You MUST have a responsible person to drive you home. 8. Someone MUST be with you the first 24 hours after you arrive home or your discharge will be delayed. 9. Please wear clothes that are easy to get on and off and wear slip-on shoes.  Thank you for allowing us  to care for you!   -- [facility] Invasive Cardiovascular services        Signed, Abagail Hoar, PA-C  11/07/2023 1:01 PM    [facility]

## 2023-11-06 NOTE — Progress Notes (Unsigned)
 [facility]ART AND VASCULAR CENTER   MULTIDISCIPLINARY HEART VALVE CLINIC                                     Cardiology Office Note:    Date:  11/07/2023   ID:  [name], DOB February 14, 1935, MRN [id number]  PCP:  Margarete Sharps, MD  [facility] Cardiologist:  Arnoldo Lapping, MD  [facility] Structural heart: Arnoldo Lapping, MD [facility] Electrophysiologist:  Jolly Needle, MD (Inactive)   Referring MD: Margarete Sharps, MD   Follow up - severe MR. Set up heart cath.   History of Present Illness:    [name] is a 89 y.o. 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   He was admitted 2/24-08/11/23 with acute on chronic heart failure after discontinuation of Entresto  and presenting with hypertensive urgency. Echo at that time showed EF 60% with mod-severe MR. 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 This was reviewed at our Multidisciplinary Heart Valve Team meeting and pt felt to have amendable anatomy for mTEER and recommended continuing with evaluation.   Today the patient presents to clinic for follow up and to discuss setting up a L/RHC. Here today alone. He continues to have shortness of breath. He notices it when going up stairs. 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 He wants to be able to work on his place but he hasn't had the breath to keep up. No CP or SOB. No LE edema, orthopnea or PND. No dizziness or syncope. No blood in stool or urine. No palpitations. Neighbor said he will pick him up  after heart cath.    Past Medical History:  Diagnosis Date   (HFimpEF) heart failure with improved EF    Echo 04/2019: EF 35-40 // Echo 5/22: EF 55-60, no RWMA, GR 1 DD, moderate to severe MR, myxomatous mitral valve, similar to prior echocardiogram, mildly elevated PASP, RVSP 35.9, mild AI, mild AV sclerosis without AS, mild dilation of ascending aorta (43 mm)   A-fib (HCC)    RVR-DCCV   Abnormal PSA 01/17/2012   10.38/4/5/ PSA:2013=6.96/ PSA 07/08/2011=6.26/ PSA 2006=8.6   Aneurysm (HCC)    MILD 2.7 CM FUSIFORM llIAC ANEURYSM   Aortic insufficiency 10/01/2021   Echocardiogram March 2023: mild to mod AI   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/16/2021   Echocardiogram March 2023: 41 mm   Blood transfusion without reported diagnosis    during vagotomy 1976   Cancer (HCC) 2006   L-2 solitary metastatic lesion/no rad tx ,asymptomatic   Chronic kidney disease    nephrolithiasis right kidney   Coronary artery disease    ED (erectile dysfunction)    GERD (gastroesophageal reflux disease)    PUD S/P PARTIAL VAGOTOMY   Gynecomastia 04/21/2011   seen by Dr.Murray, no rad tx   H/O impacted cerumen    S/P ENT DR BATES   History of echocardiogram    echo 4/16:  mild LVH, EF 55-60%,  Gr 1 DD, no RWMA, mild AI, mild MR, mod LAE, mild TR   Hx of cardiovascular stress test    Myoview  5/16:  EF 50%, no scar or ischemia, low risk   Hydroureteronephrosis    right    Hyperglycemia    WITHOUT DM2   Hyperlipidemia    Hypertension    Insomnia    Migraines    WITH AURA   Mitral regurgitation 08/10/2018   Echo 11/04/20: EF 55-60, no RWMA, Gr 1 DD, mod to severe MR, normal RVSF, RVSP 35.9, mild LAE, mild AI, AV sclerosis w/o AS, ascending aorta 43 mm (mild dilation) Echo 3/23: EF 60-65, no RWMA, mild LVH, GR 1 DD, normal RVSF, mildly elevated PASP, moderate LAE, moderate MR, mild to moderate AI, dilated ascending aorta (41 mm), dilated aortic root (40 mm), RVSP 36.4   Nephrolithiasis    Osteopenia     Overweight    Prostate cancer (HCC) 12/09/03 dx   Prostate ca/adenocarcinoma,gleason=3+3=6    pSA 4.6   Prostate cancer (HCC)    METASTATIC WITH L2 SCLEROTIC LESION ON LUPRON------DR. WOODRUFF   RBBB (right bundle branch block)    Rosacea    Sinus bradycardia    Sleep apnea    pt unaware, no sleep study   Stroke [facility])    asymptomatic, found on head imaging   Ulcer    peptic   Use of leuprolide acetate (Lupron) 06/2005-11/2006   02/2008 degarelix 02/2008 x 3 doses     Current Medications: Current Meds  Medication Sig   albuterol  (VENTOLIN  HFA) 108 (90 Base) MCG/ACT inhaler 2 puffs every 6 (six) hours as needed for shortness of breath or wheezing.   Carboxymethylcellulose Sodium (LUBRICANT EYE DROPS OP) Place 1 drop into both eyes daily as needed (dry eyes).   ezetimibe  (ZETIA ) 10 MG tablet TAKE 1 TABLET(10 MG) BY MOUTH DAILY   sacubitril -valsartan  (ENTRESTO ) 24-26 MG Take 1 tablet by mouth 2 (two) times daily.   [DISCONTINUED] carvedilol  (COREG ) 3.125 MG tablet Take 1 tablet (3.125 mg total) by mouth 2 (two) times daily with a meal.   [DISCONTINUED] ELIQUIS  5 MG TABS tablet TAKE 1 TABLET(5 MG) BY MOUTH TWICE DAILY   [DISCONTINUED] furosemide  (LASIX ) 20 MG tablet TAKE 1 TABLET BY MOUTH EVERY DAY      ROS:   Please see the history of present illness.    All other systems reviewed and are negative.  EKGs       Risk Assessment/Calculations:    CHA2DS2-VASc Score = 7   This indicates a 11.2% annual risk of stroke. The patient's score is based upon: CHF History: 1 HTN History: 1 Diabetes History: 1 Stroke History: 2 Vascular Disease History: 0 Age Score: 2 Gender Score: 0          Physical Exam:    VS:  BP (!) 164/88   Pulse 85   Ht 6' (1.829 m)   Wt 241 lb (109.3 kg)   SpO2 95%   BMI 32.69 kg/m     Wt Readings from Last 3 Encounters:  11/07/23 241 lb (109.3 kg)  10/14/23 240 lb (108.9 kg)  09/20/23 239 lb (108.4 kg)     GEN: Well nourished, well  developed in no acute distress NECK: No JVD CARDIAC: RRR, 2/6 holosystolic murmur at apex. No rubs, gallops RESPIRATORY:  Clear to auscultation without rales, wheezing or rhonchi  ABDOMEN: Soft, non-tender, non-distended EXTREMITIES:  No edema; No deformity.    ASSESSMENT:    1. Mitral  valve insufficiency, unspecified etiology   2. Essential hypertension   3. Paroxysmal atrial fibrillation (HCC)     PLAN:    In order of problems listed above:  Severe MR: -- TEE 10/14/23 showed EF 45-50% with mild LV dilation, substantial left ventricular contractile dyssynchrony, mod pulm HTN, severe LAE, and mod to severe MR with a highly eccentric jet and ruptured chorda tendina to the posterior mitral leaflet with flail motion of the lateral (P1) scallop.  -- TEE reviewed at Valve Team meeting and pt felt to have amendable anatomy for mTEER and recommended continuing with evaluation.  -- Set up for [facility] on 11/14/23 with Dr. Arlester Ladd.  -- BMET/CBC today. -- ECG not done today, will do day of cath.   Informed Consent   Shared Decision Making/Informed Consent The risks [stroke (1 in 1000), death (1 in 1000), kidney failure [usually temporary] (1 in 500), bleeding (1 in 200), allergic reaction [possibly serious] (1 in 200)], benefits (diagnostic support and management of coronary artery disease) and alternatives of a cardiac catheterization were discussed in detail with [name] and he is willing to proceed.     PAF: -- Sounds regular on exam.  -- Continue Eliquis  5mg  BID.  -- Will hold for heart cath.   HTN: -- BP elevated.  -- Continue Entresto  24-26 mg BID.  -- Continue Lasix  20mg  daily. -- Not taking Coreg  3.125mg  BID for unclear reasons, I have called this back in for him.   HFmEF: -- EF 45-50% with substantial left ventricular contractile dyssynchrony, even though the ECG does not show a typical LBBB on TEE.  -- Appears euvolemic.  -- Continue Entresto  24-26 mg BID.  -- Continue Lasix   20mg  daily. -- Not taking Coreg  3.125mg  BID for unclear reasons, I have called this back in for him.   CAD: -- No angina. -- No antiplatelet given need for OAC.  -- Statin intolerant.  -- Continue Zetia  10mg  daily.     Informed Consent   Shared Decision Making/Informed Consent The risks [stroke (1 in 1000), death (1 in 1000), kidney failure [usually temporary] (1 in 500), bleeding (1 in 200), allergic reaction [possibly serious] (1 in 200)], benefits (diagnostic support and management of coronary artery disease) and alternatives of a cardiac catheterization were discussed in detail with [name] and he is willing to proceed.        Medication Adjustments/Labs and Tests Ordered: Current medicines are reviewed at length with the patient today.  Concerns regarding medicines are outlined above.  Orders Placed This Encounter  Procedures   Basic metabolic panel with GFR   CBC   Meds ordered this encounter  Medications   furosemide  (LASIX ) 20 MG tablet    Sig: Take 1 tablet (20 mg total) by mouth daily.    Dispense:  90 tablet    Refill:  3   apixaban  (ELIQUIS ) 5 MG TABS tablet    Sig: Take 1 tablet (5 mg total) by mouth 2 (two) times daily.    Dispense:  180 tablet    Refill:  2   carvedilol  (COREG ) 3.125 MG tablet    Sig: Take 1 tablet (3.125 mg total) by mouth 2 (two) times daily with a meal.    Dispense:  180 tablet    Refill:  3    Patient Instructions  Medication Instructions:  Your physician recommends that you continue on your current medications as directed. Please refer to the Current Medication list given to you today.  *If you  need a refill on your cardiac medications before your next appointment, please call your pharmacy*  Lab Work: BMET, CBC If you have labs (blood work) drawn today and your tests are completely normal, you will receive your results only by: MyChart Message (if you have MyChart) OR A paper copy in the mail If you have any lab test that is  abnormal or we need to change your treatment, we will call you to review the results.  Testing/Procedures: Your physician has requested that you have a cardiac catheterization. Cardiac catheterization is used to diagnose and/or treat various heart conditions. Doctors may recommend this procedure for a number of different reasons. The most common reason is to evaluate chest pain. Chest pain can be a symptom of coronary artery disease (CAD), and cardiac catheterization can show whether plaque is narrowing or blocking your heart's arteries. This procedure is also used to evaluate the valves, as well as measure the blood flow and oxygen levels in different parts of your heart. For further information please visit [email or web address] Please follow instruction sheet, as given.  Follow-Up: At [facility], you and your health needs are our priority.  As part of our continuing mission to provide you with exceptional heart care, our providers are all part of one team.  This team includes your primary Cardiologist (physician) and Advanced Practice Providers or APPs (Physician Assistants and Nurse Practitioners) who all work together to provide you with the care you need, when you need it.  Your next appointment:   STRUCTURAL HEART TEAM WILL CALL YOU WITH A SCHEDULED APPOINTMENT    We recommend signing up for the patient portal called "MyChart".  Sign up information is provided on this After Visit Summary.  MyChart is used to connect with patients for Virtual Visits (Telemedicine).  Patients are able to view lab/test results, encounter notes, upcoming appointments, etc.  Non-urgent messages can be sent to your provider as well.   To learn more about what you can do with MyChart, go to [email or web address].   Other Instructions  [place] [facility] A DEPT OF [place] HEARTCARE AT [address] A DEPT OF THE [place] [address] [place] [address] Dept:  [phone] Loc: [phone]  [name]  11/07/2023  You are scheduled for a Cardiac Catheterization on Monday, May 12 with Dr. Arnoldo Lapping.  1. Please arrive at the [facility] (Main Entrance A) at [facility]: [address] [place], [place] [address] at 10:00 AM (This time is 2 hour(s) before your procedure to ensure your preparation).   Free valet parking service is available. You will check in at ADMITTING. The support person will be asked to wait in the waiting room.  It is OK to have someone drop you off and come back when you are ready to be discharged.    Special note: Every effort is made to have your procedure done on time. Please understand that emergencies sometimes delay scheduled procedures.  2. Diet: Do not eat solid foods after midnight.  The patient may have clear liquids until 5am upon the day of the procedure.  3. Labs: TODAY  4. Medication instructions in preparation for your procedure:   Contrast Allergy: No  Stop taking Eliquis  (Apixiban) on Saturday, May 10.   On the morning of your procedure, take your Aspirin  81 mg and any morning medicines NOT listed above.  You may use sips of water.  5. Plan to go home  the same day, you will only stay overnight if medically necessary. 6. Bring a current list of your medications and current insurance cards. 7. You MUST have a responsible person to drive you home. 8. Someone MUST be with you the first 24 hours after you arrive home or your discharge will be delayed. 9. Please wear clothes that are easy to get on and off and wear slip-on shoes.  Thank you for allowing us  to care for you!   -- [facility] Invasive Cardiovascular services        Signed, Abagail Hoar, PA-C  11/07/2023 1:01 PM    [facility]

## 2023-11-07 ENCOUNTER — Ambulatory Visit: Attending: Physician Assistant | Admitting: Physician Assistant

## 2023-11-07 ENCOUNTER — Telehealth: Payer: Self-pay

## 2023-11-07 ENCOUNTER — Encounter: Payer: Self-pay | Admitting: Physician Assistant

## 2023-11-07 VITALS — BP 164/88 | HR 85 | Ht 72.0 in | Wt 241.0 lb

## 2023-11-07 DIAGNOSIS — I480 Paroxysmal atrial fibrillation: Secondary | ICD-10-CM

## 2023-11-07 DIAGNOSIS — I10 Essential (primary) hypertension: Secondary | ICD-10-CM | POA: Diagnosis not present

## 2023-11-07 DIAGNOSIS — I340 Nonrheumatic mitral (valve) insufficiency: Secondary | ICD-10-CM

## 2023-11-07 MED ORDER — APIXABAN 5 MG PO TABS: 5.0000 mg | ORAL_TABLET | Freq: Two times a day (BID) | ORAL | 2 refills | Status: DC

## 2023-11-07 MED ORDER — FUROSEMIDE 20 MG PO TABS: 20.0000 mg | ORAL_TABLET | Freq: Every day | ORAL | 3 refills | Status: DC

## 2023-11-07 MED ORDER — CARVEDILOL 3.125 MG PO TABS: 3.1250 mg | ORAL_TABLET | Freq: Two times a day (BID) | ORAL | 3 refills | Status: DC

## 2023-11-07 NOTE — Patient Instructions (Addendum)
 Medication Instructions:  Your physician recommends that you continue on your current medications as directed. Please refer to the Current Medication list given to you today.  *If you need a refill on your cardiac medications before your next appointment, please call your pharmacy*  Lab Work: BMET, CBC If you have labs (blood work) drawn today and your tests are completely normal, you will receive your results only by: MyChart Message (if you have MyChart) OR A paper copy in the mail If you have any lab test that is abnormal or we need to change your treatment, we will call you to review the results.  Testing/Procedures: Your physician has requested that you have a cardiac catheterization. Cardiac catheterization is used to diagnose and/or treat various heart conditions. Doctors may recommend this procedure for a number of different reasons. The most common reason is to evaluate chest pain. Chest pain can be a symptom of coronary artery disease (CAD), and cardiac catheterization can show whether plaque is narrowing or blocking your heart's arteries. This procedure is also used to evaluate the valves, as well as measure the blood flow and oxygen levels in different parts of your heart. For further information please visit [email or web address] Please follow instruction sheet, as given.  Follow-Up: At [facility], you and your health needs are our priority.  As part of our continuing mission to provide you with exceptional heart care, our providers are all part of one team.  This team includes your primary Cardiologist (physician) and Advanced Practice Providers or APPs (Physician Assistants and Nurse Practitioners) who all work together to provide you with the care you need, when you need it.  Your next appointment:   STRUCTURAL HEART TEAM WILL CALL YOU WITH A SCHEDULED APPOINTMENT    We recommend signing up for the patient portal called "MyChart".  Sign up information is provided on this  After Visit Summary.  MyChart is used to connect with patients for Virtual Visits (Telemedicine).  Patients are able to view lab/test results, encounter notes, upcoming appointments, etc.  Non-urgent messages can be sent to your provider as well.   To learn more about what you can do with MyChart, go to [email or web address].   Other Instructions  [place] [facility]ARTC[facility] A DEPT OF [place] HEARTCARE AT [address] A DEPT OF THE [place]. [facility] [address] [place] [address] Dept: [phone] Loc: [phone]  [name]  11/07/2023  You are scheduled for a Cardiac Catheterization on Monday, May 12 with Dr. Arnoldo Lapping.  1. Please arrive at the [facility] (Main Entrance A) at [facility]: [address] [place], [place] [address] at 10:00 AM (This time is 2 hour(s) before your procedure to ensure your preparation).   Free valet parking service is available. You will check in at ADMITTING. The support person will be asked to wait in the waiting room.  It is OK to have someone drop you off and come back when you are ready to be discharged.    Special note: Every effort is made to have your procedure done on time. Please understand that emergencies sometimes delay scheduled procedures.  2. Diet: Do not eat solid foods after midnight.  The patient may have clear liquids until 5am upon the day of the procedure.  3. Labs: TODAY  4. Medication instructions in preparation for your procedure:   Contrast Allergy: No  Stop taking Eliquis  (Apixiban) on Saturday, May 10.   On the morning  of your procedure, take your Aspirin  81 mg and any morning medicines NOT listed above.  You may use sips of water.  5. Plan to go home the same day, you will only stay overnight if medically necessary. 6. Bring a current list of your medications and current insurance cards. 7. You MUST have a responsible person to drive you home. 8. Someone MUST be with you the  first 24 hours after you arrive home or your discharge will be delayed. 9. Please wear clothes that are easy to get on and off and wear slip-on shoes.  Thank you for allowing us  to care for you!   -- [place] Invasive Cardiovascular services

## 2023-11-07 NOTE — Telephone Encounter (Signed)
 [name]

## 2023-11-08 ENCOUNTER — Telehealth: Payer: Self-pay

## 2023-11-08 LAB — BASIC METABOLIC PANEL WITH GFR
BUN/Creatinine Ratio: 17 (ref 10–24)
BUN: 21 mg/dL (ref 8–27)
CO2: 24 mmol/L (ref 20–29)
Calcium: 9.5 mg/dL (ref 8.6–10.2)
Chloride: 102 mmol/L (ref 96–106)
Creatinine, Ser: 1.26 mg/dL (ref 0.76–1.27)
Glucose: 164 mg/dL — ABNORMAL HIGH (ref 70–99)
Potassium: 5 mmol/L (ref 3.5–5.2)
Sodium: 141 mmol/L (ref 134–144)
eGFR: 56 mL/min/{1.73_m2} — ABNORMAL LOW (ref >59)

## 2023-11-08 LAB — CBC
Hematocrit: 45.7 % (ref 37.5–51.0)
Hemoglobin: 14.6 g/dL (ref 13.0–17.7)
MCH: 29.2 pg (ref 26.6–33.0)
MCHC: 31.9 g/dL (ref 31.5–35.7)
MCV: 91 fL (ref 79–97)
Platelets: 193 10*3/uL (ref 150–450)
RBC: 5 x10E6/uL (ref 4.14–5.80)
RDW: 13.3 % (ref 11.6–15.4)
WBC: 6.7 10*3/uL (ref 3.4–10.8)

## 2023-11-08 NOTE — Telephone Encounter (Signed)
 [name]:   Fossa looks clear for transseptal puncture. Enough room within the LA to achieve straddle and steer down to the valve plane. Patient has primary MR and a flail P1 segment lateral. Will want to confirm with a bicomm x-plane sweep that there are no other primary defects across the valve. Long axis grasping windows had posterior leaflet measurements all above 1.0cm. Valve area was measured ~5.90cm2 and the gradient was not seen. Plan on placing the first clip lateral, directly over the ruptured chord, and see how the MR responds. With the flail as commissural as it appears on the 3D view, the smaller clip option maybe the best option. If we find on the day of the procedure that the defect is on the lateral side of P2 and somewhat central, the longer XT or XTW clip option would be reasonable.

## 2023-11-10 ENCOUNTER — Telehealth: Payer: Self-pay | Admitting: *Deleted

## 2023-11-10 NOTE — Telephone Encounter (Signed)
 Cardiac Catheterization scheduled at [facility] for: Monday Nov 14, 2023 12 Noon Arrival time [facility] Main Entrance A at: 10 AM  Nothing to eat after midnight prior to procedure, clear liquids until 5 AM day of procedure.  Medication instructions: -Hold:  Eliquis -none 11/12/23 until post procedure  Lasix /Entresto -AM of procedure-GFR <60 -Other usual morning medications can be taken with sips of water.  Patient's daughter tells me patient advised not to take aspirin  due to stomach ulcers.  Plan to go home the same day, you will only stay overnight if medically necessary.  You must have responsible adult to drive you home.  Someone must be with you the first 24 hours after you arrive home.  Reviewed procedure instructions with patient's daughter (DPR), Amy.

## 2023-11-14 ENCOUNTER — Encounter (HOSPITAL_COMMUNITY): Payer: Self-pay | Admitting: Cardiovascular Disease

## 2023-11-14 ENCOUNTER — Other Ambulatory Visit: Payer: Self-pay

## 2023-11-14 ENCOUNTER — Encounter (HOSPITAL_COMMUNITY)
Admission: RE | Disposition: A | Discharge status: Home / Self Care | Payer: Self-pay | Source: Home / Self Care | Attending: Cardiovascular Disease

## 2023-11-14 ENCOUNTER — Ambulatory Visit (HOSPITAL_COMMUNITY)
Admission: RE | Admit: 2023-11-14 | Discharge: 2023-11-14 | Disposition: A | Discharge status: Home / Self Care | Attending: Cardiovascular Disease | Admitting: Cardiovascular Disease

## 2023-11-14 DIAGNOSIS — I4819 Other persistent atrial fibrillation: Secondary | ICD-10-CM | POA: Diagnosis not present

## 2023-11-14 DIAGNOSIS — I2510 Atherosclerotic heart disease of native coronary artery without angina pectoris: Secondary | ICD-10-CM | POA: Insufficient documentation

## 2023-11-14 DIAGNOSIS — I130 Hypertensive heart and chronic kidney disease with heart failure and stage 1 through stage 4 chronic kidney disease, or unspecified chronic kidney disease: Secondary | ICD-10-CM | POA: Insufficient documentation

## 2023-11-14 DIAGNOSIS — I340 Nonrheumatic mitral (valve) insufficiency: Secondary | ICD-10-CM | POA: Diagnosis present

## 2023-11-14 DIAGNOSIS — Z7901 Long term (current) use of anticoagulants: Secondary | ICD-10-CM | POA: Diagnosis not present

## 2023-11-14 DIAGNOSIS — E1122 Type 2 diabetes mellitus with diabetic chronic kidney disease: Secondary | ICD-10-CM | POA: Insufficient documentation

## 2023-11-14 DIAGNOSIS — I5032 Chronic diastolic (congestive) heart failure: Secondary | ICD-10-CM | POA: Insufficient documentation

## 2023-11-14 DIAGNOSIS — N189 Chronic kidney disease, unspecified: Secondary | ICD-10-CM | POA: Diagnosis not present

## 2023-11-14 HISTORY — PX: RIGHT HEART CATH AND CORONARY ANGIOGRAPHY: CATH118264

## 2023-11-14 LAB — POCT I-STAT EG7
Acid-Base Excess: 1 mmol/L (ref 0.0–2.0)
Bicarbonate: 27.2 mmol/L (ref 20.0–28.0)
Calcium, Ion: 1.15 mmol/L (ref 1.15–1.40)
HCT: 41 % (ref 39.0–52.0)
Hemoglobin: 13.9 g/dL (ref 13.0–17.0)
O2 Saturation: 62 %
Potassium: 3.5 mmol/L (ref 3.5–5.1)
Sodium: 140 mmol/L (ref 135–145)
TCO2: 29 mmol/L (ref 22–32)
pCO2, Ven: 47.8 mmHg (ref 44–60)
pH, Ven: 7.363 (ref 7.25–7.43)
pO2, Ven: 34 mmHg (ref 32–45)

## 2023-11-14 LAB — POCT I-STAT 7, (LYTES, BLD GAS, ICA,H+H)
Acid-Base Excess: 0 mmol/L (ref 0.0–2.0)
Bicarbonate: 24.4 mmol/L (ref 20.0–28.0)
Calcium, Ion: 1.16 mmol/L (ref 1.15–1.40)
HCT: 42 % (ref 39.0–52.0)
Hemoglobin: 14.3 g/dL (ref 13.0–17.0)
O2 Saturation: 94 %
Potassium: 3.6 mmol/L (ref 3.5–5.1)
Sodium: 142 mmol/L (ref 135–145)
TCO2: 26 mmol/L (ref 22–32)
pCO2 arterial: 38.7 mmHg (ref 32–48)
pH, Arterial: 7.408 (ref 7.35–7.45)
pO2, Arterial: 69 mmHg — ABNORMAL LOW (ref 83–108)

## 2023-11-14 LAB — GLUCOSE, CAPILLARY
Glucose-Capillary: 182 mg/dL — ABNORMAL HIGH (ref 70–99)
Glucose-Capillary: 152 mg/dL — ABNORMAL HIGH (ref 70–99)

## 2023-11-14 SURGERY — RIGHT HEART CATH AND CORONARY ANGIOGRAPHY
Anesthesia: LOCAL

## 2023-11-14 MED ORDER — VERAPAMIL HCL 2.5 MG/ML IV SOLN
INTRAVENOUS | Status: AC
  Filled 2023-11-14: qty 2

## 2023-11-14 MED ORDER — FENTANYL CITRATE (PF) 100 MCG/2ML IJ SOLN
INTRAMUSCULAR | Status: DC | PRN
  Administered 2023-11-14: 25 ug via INTRAVENOUS

## 2023-11-14 MED ORDER — SODIUM CHLORIDE 0.9 % WEIGHT BASED INFUSION: 3.0000 mL/kg/h | INTRAVENOUS | Status: AC

## 2023-11-14 MED ORDER — HEPARIN (PORCINE) IN NACL 1000-0.9 UT/500ML-% IV SOLN
INTRAVENOUS | Status: DC | PRN
  Administered 2023-11-14 (×2): 500 mL

## 2023-11-14 MED ORDER — ONDANSETRON HCL 4 MG/2ML IJ SOLN: 4.0000 mg | Freq: Four times a day (QID) | INTRAMUSCULAR | Status: DC | PRN

## 2023-11-14 MED ORDER — FENTANYL CITRATE (PF) 100 MCG/2ML IJ SOLN
INTRAMUSCULAR | Status: AC
  Filled 2023-11-14: qty 2

## 2023-11-14 MED ORDER — VERAPAMIL HCL 2.5 MG/ML IV SOLN
INTRAVENOUS | Status: DC | PRN
  Administered 2023-11-14: 10 mL via INTRA_ARTERIAL

## 2023-11-14 MED ORDER — MIDAZOLAM HCL 2 MG/2ML IJ SOLN
INTRAMUSCULAR | Status: AC
  Filled 2023-11-14: qty 2

## 2023-11-14 MED ORDER — HEPARIN SODIUM (PORCINE) 1000 UNIT/ML IJ SOLN
INTRAMUSCULAR | Status: DC | PRN
  Administered 2023-11-14: 5000 [IU] via INTRAVENOUS

## 2023-11-14 MED ORDER — HYDRALAZINE HCL 20 MG/ML IJ SOLN
INTRAMUSCULAR | Status: AC
  Filled 2023-11-14: qty 1

## 2023-11-14 MED ORDER — HEPARIN SODIUM (PORCINE) 1000 UNIT/ML IJ SOLN
INTRAMUSCULAR | Status: AC
  Filled 2023-11-14: qty 10

## 2023-11-14 MED ORDER — LABETALOL HCL 5 MG/ML IV SOLN: 10.0000 mg | INTRAVENOUS | Status: DC | PRN

## 2023-11-14 MED ORDER — HYDRALAZINE HCL 20 MG/ML IJ SOLN
10.0000 mg | INTRAMUSCULAR | Status: DC | PRN
  Administered 2023-11-14: 10 mg via INTRAVENOUS

## 2023-11-14 MED ORDER — SODIUM CHLORIDE 0.9% FLUSH: 3.0000 mL | Freq: Two times a day (BID) | INTRAVENOUS | Status: DC

## 2023-11-14 MED ORDER — SODIUM CHLORIDE 0.9% FLUSH: 3.0000 mL | INTRAVENOUS | Status: DC | PRN

## 2023-11-14 MED ORDER — LIDOCAINE HCL (PF) 1 % IJ SOLN
INTRAMUSCULAR | Status: AC
  Filled 2023-11-14: qty 30

## 2023-11-14 MED ORDER — IOHEXOL 350 MG/ML SOLN
INTRAVENOUS | Status: DC | PRN
  Administered 2023-11-14: 50 mL via INTRA_ARTERIAL

## 2023-11-14 MED ORDER — MIDAZOLAM HCL 2 MG/2ML IJ SOLN
INTRAMUSCULAR | Status: DC | PRN
  Administered 2023-11-14: 1 mg via INTRAVENOUS

## 2023-11-14 MED ORDER — LIDOCAINE HCL (PF) 1 % IJ SOLN
INTRAMUSCULAR | Status: DC | PRN
  Administered 2023-11-14: 5 mL

## 2023-11-14 MED ORDER — SODIUM CHLORIDE 0.9 % IV SOLN: 250.0000 mL | INTRAVENOUS | Status: DC | PRN

## 2023-11-14 MED ORDER — ACETAMINOPHEN 325 MG PO TABS: 650.0000 mg | ORAL_TABLET | ORAL | Status: DC | PRN

## 2023-11-14 MED ORDER — SODIUM CHLORIDE 0.9 % WEIGHT BASED INFUSION: 1.0000 mL/kg/h | INTRAVENOUS | Status: DC

## 2023-11-14 SURGICAL SUPPLY — 9 items
CATH 5FR JL3.5 JR4 ANG PIG MP (CATHETERS) IMPLANT
CATH BALLN WEDGE 5F 110CM (CATHETERS) IMPLANT
DEVICE RAD COMP TR BAND LRG (VASCULAR PRODUCTS) IMPLANT
GLIDESHEATH SLEND SS 6F .021 (SHEATH) IMPLANT
GUIDEWIRE .025 260CM (WIRE) IMPLANT
GUIDEWIRE INQWIRE 1.5J.035X260 (WIRE) IMPLANT
PACK CARDIAC CATHETERIZATION (CUSTOM PROCEDURE TRAY) ×1 IMPLANT
SET ATX-X65L (MISCELLANEOUS) IMPLANT
SHEATH GLIDE SLENDER 4/5FR (SHEATH) IMPLANT

## 2023-11-14 NOTE — Discharge Instructions (Signed)

## 2023-11-14 NOTE — Interval H&P Note (Signed)
 History and Physical Interval Note:  11/14/2023 10:18 AM  [name]  has presented today for surgery, with the diagnosis of MR.  The various methods of treatment have been discussed with the patient and family. After consideration of risks, benefits and other options for treatment, the patient has consented to  Procedure(s): RIGHT/LEFT HEART CATH AND CORONARY ANGIOGRAPHY (N/A) as a surgical intervention.  The patient's history has been reviewed, patient examined, no change in status, stable for surgery.  I have reviewed the patient's chart and labs.  Questions were answered to the patient's satisfaction.     Arnoldo Lapping

## 2023-12-01 NOTE — Progress Notes (Unsigned)
 [address]       [place] [address]             [phone]        [name] [facility] Medical Record #[id number] Date of Birth: October 17, 1934  Referring: Arnoldo Lapping, MD Primary Care: Margarete Sharps, MD Primary Cardiologist:Michael Arlester Ladd, MD  Chief Complaint:   No chief complaint on file.   History of Present Illness:     [name] is a 89 y.o. male who presents for surgical evaluation of ***   [name] is a 89 y.o. 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    He was admitted 2/24-08/11/23 with acute on chronic heart failure after discontinuation of Entresto  and presenting with hypertensive urgency. Echo at that time showed EF 60% with mod-severe MR. 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 This was reviewed at our Multidisciplinary Heart Valve Team meeting and pt felt to have amendable anatomy for mTEER and recommended continuing with evaluation.    Today the patient presents to clinic for follow up and to discuss setting up a L/RHC. Here today alone. He continues to have shortness of breath. He notices it when going up stairs. 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 He wants to be able to work on his place but he hasn't had the breath to keep up. No CP or SOB. No LE edema, orthopnea or PND. No dizziness or syncope. No blood in stool or urine. No palpitations. Neighbor said he will pick him up after heart cath.   Past Medical and Surgical History: Previous  Chest Surgery: *** Previous Chest Radiation: *** Diabetes Mellitus: ***.  HbA1C *** Creatinine:  Lab Results  Component Value Date   CREATININE 1.26 11/07/2023   CREATININE 0.94 09/20/2023   CREATININE 0.89 08/31/2023     Past Medical History:  Diagnosis Date   (HFimpEF) heart failure with improved EF    Echo 04/2019: EF 35-40 // Echo 5/22: EF 55-60, no RWMA, GR 1 DD, moderate to severe MR, myxomatous mitral valve, similar to prior echocardiogram, mildly elevated PASP, RVSP 35.9, mild AI, mild AV sclerosis without AS, mild dilation of ascending aorta (43 mm)   A-fib (HCC)    RVR-DCCV   Abnormal PSA 01/17/2012   10.38/4/5/ PSA:2013=6.96/ PSA 07/08/2011=6.26/ PSA 2006=8.6   Aneurysm (HCC)    MILD 2.7 CM FUSIFORM llIAC ANEURYSM   Aortic insufficiency 10/01/2021   Echocardiogram March 2023: mild to mod AI   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/16/2021   Echocardiogram March 2023: 41 mm   Blood transfusion without reported diagnosis    during vagotomy 1976   Cancer (HCC) 2006   L-2 solitary metastatic lesion/no rad tx ,asymptomatic   Chronic kidney disease    nephrolithiasis right kidney   Coronary artery disease    ED (erectile dysfunction)    GERD (gastroesophageal reflux disease)    PUD S/P PARTIAL VAGOTOMY   Gynecomastia 04/21/2011   seen by Dr.Murray, no rad tx   H/O impacted  cerumen    S/P ENT DR BATES   History of echocardiogram    echo 4/16:  mild LVH, EF 55-60%, Gr 1 DD, no RWMA, mild AI, mild MR, mod LAE, mild TR   Hx of cardiovascular stress test    Myoview  5/16:  EF 50%, no scar or ischemia, low risk   Hydroureteronephrosis    right    Hyperglycemia    WITHOUT DM2   Hyperlipidemia    Hypertension    Insomnia    Migraines    WITH AURA   Mitral regurgitation 08/10/2018   Echo 11/04/20: EF 55-60, no RWMA, Gr 1 DD, mod to severe MR, normal RVSF, RVSP 35.9, mild LAE, mild AI, AV sclerosis w/o AS, ascending aorta 43 mm (mild dilation) Echo 3/23: EF 60-65, no RWMA, mild  LVH, GR 1 DD, normal RVSF, mildly elevated PASP, moderate LAE, moderate MR, mild to moderate AI, dilated ascending aorta (41 mm), dilated aortic root (40 mm), RVSP 36.4   Nephrolithiasis    Osteopenia    Overweight    Prostate cancer (HCC) 12/09/03 dx   Prostate ca/adenocarcinoma,gleason=3+3=6    pSA 4.6   Prostate cancer (HCC)    METASTATIC WITH L2 SCLEROTIC LESION ON LUPRON------DR. WOODRUFF   RBBB (right bundle branch block)    Rosacea    Sinus bradycardia    Sleep apnea    pt unaware, no sleep study   Stroke [facility])    asymptomatic, found on head imaging   Ulcer    peptic   Use of leuprolide acetate (Lupron) 06/2005-11/2006   02/2008 degarelix 02/2008 x 3 doses    Past Surgical History:  Procedure Laterality Date   ABDOMINAL AORTIC ENDOVASCULAR STENT GRAFT Right 10/28/2021   Procedure: ABDOMINAL AORTIC ENDOVASCULAR GRAFT WITH RIGHT EPIGASTRIC ARTERY COILING;  Surgeon: Carlene Che, MD;  Location: [facility] OR;  Service: Vascular;  Laterality: Right;   CARDIOVERSION N/A 06/19/2018   Procedure: CARDIOVERSION;  Surgeon: Darlis Eisenmenger, MD;  Location[facility] ENDOSCOPY;  Service: Cardiovascular;  Laterality: N/A;   PROSTATE BIOPSY  12/09/03   adenocarcinoma,glerason: 3+3=6   RIGHT HEART CATH AND CORONARY ANGIOGRAPHY N/A 11/14/2023   Procedure: RIGHT HEART CATH AND CORONARY ANGIOGRAPHY;  Surgeon: Arnoldo Lapping, MD;  Location: [facility] INVASIVE CV LAB;  Service: Cardiovascular;  Laterality: N/A;   RIGHT/LEFT HEART CATH AND CORONARY ANGIOGRAPHY N/A 04/25/2019   Procedure: RIGHT/LEFT HEART CATH AND CORONARY ANGIOGRAPHY;  Surgeon: Arleen Lacer, MD;  Location: [facility] INVASIVE CV LAB;  Service: Cardiovascular;  Laterality: N/A;   TEE WITHOUT CARDIOVERSION N/A 06/19/2018   Procedure: TRANSESOPHAGEAL ECHOCARDIOGRAM (TEE);  Surgeon: Darlis Eisenmenger, MD;  Location: [facility] ENDOSCOPY;  Service: Cardiovascular;  Laterality: N/A;   TRANSESOPHAGEAL ECHOCARDIOGRAM (CATH LAB) N/A 10/14/2023   Procedure: TRANSESOPHAGEAL  ECHOCARDIOGRAM;  Surgeon: Luana Rumple, MD;  Location: [facility] INVASIVE CV LAB;  Service: Cardiovascular;  Laterality: N/A;   VAGOTOMY  1978   partial    Social History:  Social History   Tobacco Use  Smoking Status Former   Types: Pipe   Quit date: 07/05/1984   Years since quitting: 39.4  Smokeless Tobacco Never    Social History   Substance and Sexual Activity  Alcohol  Use No     Allergies  Allergen Reactions   Aspirin  Other (See Comments)    History of "stomach ulcers," was told to not take this     Medications: Asprin: *** Statin: *** Beta Blocker: *** Ace Inhibitor: *** Anti-Coagulation: ***  Current Outpatient Medications  Medication Sig  Dispense Refill   apixaban  (ELIQUIS ) 5 MG TABS tablet Take 1 tablet (5 mg total) by mouth 2 (two) times daily. 180 tablet 2   ascorbic acid (VITAMIN C) 500 MG tablet Take 500 mg by mouth daily.     Carboxymethylcellulose Sodium (LUBRICANT EYE DROPS OP) Place 1 drop into both eyes daily as needed (dry eyes).     carvedilol  (COREG ) 3.125 MG tablet Take 1 tablet (3.125 mg total) by mouth 2 (two) times daily with a meal. 180 tablet 3   Cholecalciferol (VITAMIN D) 50 MCG (2000 UT) tablet Take 2,000 Units by mouth daily.     Cyanocobalamin (B-12) 2500 MCG TABS Take 2,500 mcg by mouth daily.     furosemide  (LASIX ) 20 MG tablet Take 1 tablet (20 mg total) by mouth daily. 90 tablet 3   sacubitril -valsartan  (ENTRESTO ) 24-26 MG Take 1 tablet by mouth 2 (two) times daily. 180 tablet 2   No current facility-administered medications for this visit.    (Not in a hospital admission)   Family History  Problem Relation Age of Onset   Liver cancer Father    Kidney Stones Father    Colon cancer Neg Hx    Stroke Neg Hx      Review of Systems:   ROS    Physical Exam: There were no vitals taken for this visit. Physical Exam    Diagnostic Studies & Laboratory data:  Echo: IMPRESSIONS     1. There is substantial left ventricular  contractile dyssynchrony, even  though the ECG does not show a typical LBBB. Left ventricular ejection  fraction, by estimation, is 45 to 50%. The left ventricle has mildly  decreased function. The left ventricle  demonstrates global hypokinesis. The left ventricular internal cavity size  was mildly to moderately dilated.   2. Right ventricular systolic function is normal. The right ventricular  size is normal. There is moderately elevated pulmonary artery systolic  pressure. The estimated right ventricular systolic pressure is 59.8 mmHg.   3. Left atrial size was severely dilated. No left atrial/left atrial  appendage thrombus was detected.   4. There is a ruptured chorda tendina to the posterior mitral leaflet  with flail motion of the lateral (P1) scallop and highly eccentric mitral  insufficiency. By the PISA method the effective regurgitant orifice area  is 0.31 cm sq, regurgitant volume 50   mL, regurgitant fraction 45%. Suspect these values underestimate the true  severity of the mitral insufficiency. The mitral valve area is very large  (>10 cm sq) and the mean gradient is <2 mm Hg at a heart rate of 77 bpm.  The posterior leaflet length is  14 cm. The mitral valve is abnormal. Moderate to severe mitral valve  regurgitation. No evidence of mitral stenosis.   5. The aortic valve is tricuspid. There is mild thickening of the aortic  valve. Aortic valve regurgitation is mild. Aortic valve sclerosis is  present, with no evidence of aortic valve stenosis.   6. There is mild (Grade II) plaque involving the descending aorta.   7. Evidence of atrial level shunting detected by color flow Doppler.  There is a small patent foramen ovale with predominantly left to right  shunting across the atrial septum.   I have independently reviewed the above radiologic studies and discussed with the patient   Recent Lab Findings: Lab Results  Component Value Date   WBC 6.7 11/07/2023   HGB 13.9  11/14/2023   HGB 14.3 11/14/2023  HCT 41.0 11/14/2023   HCT 42.0 11/14/2023   PLT 193 11/07/2023   GLUCOSE 164 (H) 11/07/2023   CHOL 213 (H) 11/15/2022   TRIG 215 (H) 11/15/2022   HDL 42 11/15/2022   LDLCALC 133 (H) 11/15/2022   ALT 35 08/30/2023   AST 23 08/30/2023   NA 140 11/14/2023   NA 142 11/14/2023   K 3.5 11/14/2023   K 3.6 11/14/2023   CL 102 11/07/2023   CREATININE 1.26 11/07/2023   BUN 21 11/07/2023   CO2 24 11/07/2023   TSH 1.485 04/22/2019   INR 1.1 10/28/2021   HGBA1C 7.4 (H) 08/30/2023      Assessment / Plan:   89 y.o. male with with moderate to severe mitral valve regurgitation.  Given his age, dementia and functional status, he would not be a candidate for surgical repair.  I agree with Dr. Katheryne Pane plan for mitraclip.     I  spent {CHL ONC TIME VISIT - YQMVH:[id number]} counseling the patient face to face.   Hilarie Lovely 12/01/2023 8:45 PM

## 2023-12-02 ENCOUNTER — Encounter: Payer: Self-pay | Admitting: Thoracic Surgery (Cardiothoracic Vascular Surgery)

## 2023-12-02 ENCOUNTER — Ambulatory Visit
Attending: Thoracic Surgery (Cardiothoracic Vascular Surgery) | Admitting: Thoracic Surgery (Cardiothoracic Vascular Surgery)

## 2023-12-02 VITALS — BP 156/99 | HR 68 | Resp 20 | Ht 73.0 in | Wt 243.2 lb

## 2023-12-02 DIAGNOSIS — I340 Nonrheumatic mitral (valve) insufficiency: Secondary | ICD-10-CM

## 2023-12-13 ENCOUNTER — Ambulatory Visit: Attending: Cardiovascular Disease | Admitting: Cardiovascular Disease

## 2023-12-13 ENCOUNTER — Other Ambulatory Visit: Payer: Self-pay

## 2023-12-13 VITALS — BP 148/84 | HR 78 | Ht 72.0 in | Wt 239.4 lb

## 2023-12-13 DIAGNOSIS — I493 Ventricular premature depolarization: Secondary | ICD-10-CM

## 2023-12-13 DIAGNOSIS — I340 Nonrheumatic mitral (valve) insufficiency: Secondary | ICD-10-CM

## 2023-12-13 DIAGNOSIS — I4510 Unspecified right bundle-branch block: Secondary | ICD-10-CM

## 2023-12-13 DIAGNOSIS — Z01812 Encounter for preprocedural laboratory examination: Secondary | ICD-10-CM | POA: Diagnosis not present

## 2023-12-13 DIAGNOSIS — I5022 Chronic systolic (congestive) heart failure: Secondary | ICD-10-CM | POA: Diagnosis not present

## 2023-12-13 DIAGNOSIS — I4729 Other ventricular tachycardia: Secondary | ICD-10-CM

## 2023-12-13 LAB — CBC
Hematocrit: 47.6 % (ref 37.5–51.0)
Hemoglobin: 14.9 g/dL (ref 13.0–17.7)
MCH: 29.2 pg (ref 26.6–33.0)
MCHC: 31.3 g/dL — ABNORMAL LOW (ref 31.5–35.7)
MCV: 93 fL (ref 79–97)
Platelets: 164 10*3/uL (ref 150–450)
RBC: 5.1 x10E6/uL (ref 4.14–5.80)
RDW: 13.5 % (ref 11.6–15.4)
WBC: 5.8 10*3/uL (ref 3.4–10.8)

## 2023-12-13 NOTE — H&P (View-Only) (Signed)
 Cardiology Office Note:    Date:  12/16/2023   ID:  [name], DOB 12/30/1934, MRN [id number]  PCP:  Margarete Sharps, MD   [facility] Providers Cardiologist:  Arnoldo Lapping, MD Cardiology APP:  Gabino Joe, PA-C  Electrophysiologist:  Jolly Needle, MD (Inactive)  Structural Heart:  Arnoldo Lapping, MD    Referring MD: Margarete Sharps, MD   Chief Complaint  Patient presents with   Shortness of Breath    History of Present Illness:    Puneet Masoner is a 89 y.o. male presenting for follow-up of severe mitral regurgitation.  The patient has a history of HFpEF and was admitted with decompensated heart failure in February 2025.  He was noted to have at least moderately severe mitral regurgitation and underwent transesophageal echo demonstrating severe eccentric mitral regurgitation with a flail P1 scallop.  He has remained limited by shortness of breath with a NYHA functional class II limitation.  The patient is also undergone right and left heart catheterization demonstrating patent coronary arteries with moderate three-vessel disease, diffuse calcification, and no focal high-grade stenoses.  Wedge pressure was 20 mmHg with a V wave of 27 mmHg and mean PA pressure was 24 mmHg with preserved cardiac output of 4.9 L/min.  The patient is here alone today.  He has undergone formal surgical evaluation by Dr. Deloise Ferries who deemed him a nonoperative candidate due to age, reduced functional capacity, and presence of mild dementia.  The patient states that his symptoms are unchanged.  He continues to be limited by shortness of breath with low to moderate level activities.  He is short of breath with walking or going up a flight of stairs.  He denies orthopnea or PND.  He has had no leg swelling or chest pain.   Current Medications: Current Meds  Medication Sig   apixaban  (ELIQUIS ) 5 MG TABS tablet Take 1 tablet (5 mg total) by mouth 2 (two) times daily.   ascorbic acid (VITAMIN C) 500 MG  tablet Take 500 mg by mouth daily.   Carboxymethylcellulose Sodium (LUBRICANT EYE DROPS OP) Place 1 drop into both eyes daily as needed (dry eyes).   carvedilol  (COREG ) 3.125 MG tablet Take 1 tablet (3.125 mg total) by mouth 2 (two) times daily with a meal.   Cholecalciferol (VITAMIN D) 50 MCG (2000 UT) tablet Take 2,000 Units by mouth daily.   Cyanocobalamin (B-12) 2500 MCG TABS Take 2,500 mcg by mouth daily.   furosemide  (LASIX ) 20 MG tablet Take 1 tablet (20 mg total) by mouth daily.   sacubitril -valsartan  (ENTRESTO ) 24-26 MG Take 1 tablet by mouth 2 (two) times daily.     Allergies:   Aspirin    ROS:   Please see the history of present illness.     All other systems reviewed and are negative.  EKGs/Labs/Other Studies Reviewed:    The following studies were reviewed today: Cardiac Studies & Procedures   ______________________________________________________________________________________________ CARDIAC CATHETERIZATION  CARDIAC CATHETERIZATION 11/14/2023  Conclusion   Ost LM to Mid LM lesion is 20% stenosed.   Dist LAD lesion is 70% stenosed with 60% stenosed side branch in 2nd Diag.   Ost Cx lesion is 30% stenosed.   Prox Cx lesion is 50% stenosed.   Prox Cx to Mid Cx lesion is 35% stenosed.   1st Mrg lesion is 60% stenosed.   RPDA lesion is 60% stenosed.   Ost LAD to Prox LAD lesion is 50% stenosed.  1.  Moderate three-vessel coronary artery disease with diffuse  calcification and nonobstructive stenosis of a large, dominant RCA, patency of the left main with mild nonobstructive plaque, diffusely calcified LAD with moderate proximal and mid vessel stenoses in the range of 50 to 60%, and mild to moderate circumflex/OM stenoses up to 60% 2.  Right heart catheterization data: RA mean 7 mmHg PA pressure 51/6 with a mean of 24 mmHg Wedge pressure A-wave 24, V wave 27, mean 20 mmHg Cardiac output 4.87 L/min with cardiac index 2.1 L/min/m Transpulmonary gradient 4 mmHg with PVR  of 0.8 Wood units  Plan: medical therapy for diffuse CAD, continue evaluation for mitral TEER  Findings Coronary Findings Diagnostic  Dominance: Right  Left Main The left main is patent with moderate calcification and mild nonobstructive stenosis Ost LM to Mid LM lesion is 20% stenosed. The lesion is concentric. The lesion is mildly calcified.  Left Anterior Descending There is mild diffuse disease throughout the vessel. The vessel is calcified. Proximal third of the vessel is mild to moderately calcified The LAD is diffusely diseased with severe proximal calcification.  The ostium and proximal portions of the LAD have 50% stenosis.  The mid LAD has 60 to 70% stenosis at the second diagonal which is also diseased with moderate 60% stenosis. Ost LAD to Prox LAD lesion is 50% stenosed. Dist LAD lesion is 70% stenosed with 60% stenosed side branch in 2nd Diag. The lesion is located at the major branch and eccentric. The downstream LAD beyond this appeared to be relatively small in diameter.  Lesion does not appear to be flow-limiting  First Diagonal Branch Vessel is small in size.  Second Diagonal Branch Vessel is small in size. There is mild disease in the vessel.  Second Septal Branch Vessel is small in size.  Left Circumflex There is mild diffuse disease throughout the vessel. The circumflex is patent with moderate diffuse calcification.  The first OM has 60% diffuse stenosis.  The AV circumflex has 50% stenosis in its proximal portion just beyond the origin of the high first OM. Ost Cx lesion is 30% stenosed. Ostial The lesion is calcified. Prox Cx lesion is 50% stenosed. The lesion is focal. Prox Cx to Mid Cx lesion is 35% stenosed. The lesion is irregular.  First Obtuse Marginal Branch Vessel is small in size. There is mild disease in the vessel. 1st Mrg lesion is 60% stenosed.  Left Posterior Atrioventricular Artery Vessel is small in size.  Right Coronary Artery Vessel is  large. Actually very large There is mild diffuse disease throughout the vessel. Large, dominant vessel with diffuse calcification and mild ectasia.  The PDA has scattered 50% stenoses but no severe or high-grade obstruction.  The PLA is patent.  Acute Marginal Branch Vessel is small in size.  Right Posterior Descending Artery Vessel is large in size. Wraparound PDA RPDA lesion is 60% stenosed.  First Right Posterolateral Branch Vessel is small in size.  Intervention  No interventions have been documented.   CARDIAC CATHETERIZATION  CARDIAC CATHETERIZATION 04/25/2019  Conclusion  Hemodynamic findings consistent with mild pulmonary hypertension - mean PAP 28 mmHg  LV end diastolic pressure is mildly elevated. - LVEDP 12-15 mmHg  Cardiac output-index 7.44-3.27.  ---------------------------------  Ost LM to Mid LM lesion is 20% stenosed. Ost Cx lesion is 30% stenosed. -- calcified  1st Mrg lesion is 60% stenosed.  Dist LAD lesion is 55% stenosed with 55% stenosed side branch in 2nd Diag. - the downstream LAD is relatively the same diameter as noted in the stenosis.  Otherwise minimal to mild disease throughout.  SUMMARY  Diffuse mild to moderate CAD: Most notable 60% proximal OM1, 50 to 60% mid LAD at D2.  No obvious culprit lesion to explain significantly reduced EF.  Mild proximal LM and ostial LCx-calcified lesions.  Mild pulmonary hypertension with mean PA pressure of 28 mmHg.  LVEDP and PCWP from 12 to 15 mmHg.  Borderline CO-CI  RECOMMENDATIONS  Return to nursing unit for ongoing care, TR band removal.  Would continue aggressive risk factor modification and heart failure medications.  Appears to be at relatively well diuresed, would titrate up afterload reduction  If he has any concerning symptoms for possible angina, would consider noninvasive evaluation with Myoview , or perhaps coronary CTA to determine presence of either anterior or lateral ischemia  suggesting LAD or OM lesions are more significant than they appear angiographically.    Randene Bustard, M.D., M.S. Interventional Cardiologist  Pager # [phone] Phone # [phone] [address] [place], [place] [address]  Findings Coronary Findings Diagnostic  Dominance: Right  Left Main Ost LM to Mid LM lesion is 20% stenosed. The lesion is concentric. The lesion is mildly calcified.  Left Anterior Descending There is mild diffuse disease throughout the vessel. The vessel is calcified. Proximal third of the vessel is mild to moderately calcified Dist LAD lesion is 55% stenosed with 55% stenosed side branch in 2nd Diag. The lesion is located at the major branch and eccentric. The downstream LAD beyond this appeared to be relatively small in diameter.  Lesion does not appear to be flow-limiting  First Diagonal Branch Vessel is small in size.  Second Diagonal Branch Vessel is small in size. There is mild disease in the vessel.  Second Septal Branch Vessel is small in size.  Left Circumflex There is mild diffuse disease throughout the vessel. Ost Cx lesion is 30% stenosed. Ostial The lesion is calcified. Prox Cx lesion is 25% stenosed. The lesion is focal. Prox Cx to Mid Cx lesion is 35% stenosed. The lesion is irregular.  First Obtuse Marginal Branch Vessel is small in size. There is mild disease in the vessel. 1st Mrg lesion is 60% stenosed.  Left Posterior Atrioventricular Artery Vessel is small in size.  Right Coronary Artery Vessel is large. Actually very large The vessel exhibits minimal luminal irregularities.  Acute Marginal Branch Vessel is small in size.  Right Posterior Descending Artery Vessel is large in size. Wraparound PDA  First Right Posterolateral Branch Vessel is small in size.  Intervention  No interventions have been documented.   STRESS TESTS  MYOCARDIAL PERFUSION IMAGING 10/01/2021  Narrative   Findings are  consistent with prior myocardial infarction and no peri-infarct ischemia. The study is intermediate risk due to depresesd ejection fraction.   No ST deviation was noted.   Left ventricular function is abnormal. Global function is moderately reduced. Nuclear stress EF: 41 %. The left ventricular ejection fraction is moderately decreased (30-44%). End diastolic cavity size is moderately enlarged.   Prior study not available for comparison.   ECHOCARDIOGRAM  ECHOCARDIOGRAM COMPLETE 08/30/2023  Narrative ECHOCARDIOGRAM REPORT    Patient Name:   BERNIE RANSFORD Date of Exam: 08/30/2023 Medical Rec #:  [id number]    Height:       72.0 in Accession #:    [id number]   Weight:       245.0 lb Date of Birth:  02/25/1935    BSA:          2.322 m Patient Age:  88 years     BP:           157/89 mmHg Patient Gender: M            HR:           77 bpm. Exam Location:  Inpatient  Procedure: 2D Echo, Cardiac Doppler and Color Doppler (Both Spectral and Color Flow Doppler were utilized during procedure).  Indications:    CHF-acute diastolic  History:        Patient has prior history of Echocardiogram examinations, most recent 10/18/2022. CHF, CAD, Stroke, Arrythmias:Atrial Fibrillation and RBBB, Signs/Symptoms:Fatigue; Risk Factors:Hypertension, Sleep Apnea, Former Smoker and Dyslipidemia.  Sonographer:    Juanita Shaw Referring Phys: [id number] CAROLE N HALL  IMPRESSIONS   1. Left ventricular ejection fraction, by estimation, is 60 to 65%. The left ventricle has normal function. The left ventricle has no regional wall motion abnormalities. Left ventricular diastolic parameters are consistent with Grade II diastolic dysfunction (pseudonormalization). 2. Right ventricular systolic function is normal. The right ventricular size is normal. There is mildly elevated pulmonary artery systolic pressure. The estimated right ventricular systolic pressure is 39.7 mmHg. 3. Left atrial size was moderately  dilated. 4. Eccentric posteriorly directed MR jet makes quantificaiton difficult but at least moderate. No pulmonary vein reversal. Appears similar to previous echocardiogram. The mitral valve is abnormal. Moderate to severe mitral valve regurgitation. No evidence of mitral stenosis. 5. The aortic valve is tricuspid. Aortic valve regurgitation is mild to moderate. No aortic stenosis is present. 6. The inferior vena cava is normal in size with greater than 50% respiratory variability, suggesting right atrial pressure of 3 mmHg.  FINDINGS Left Ventricle: Left ventricular ejection fraction, by estimation, is 60 to 65%. The left ventricle has normal function. The left ventricle has no regional wall motion abnormalities. Strain imaging was not performed. The left ventricular internal cavity size was normal in size. There is no left ventricular hypertrophy. Left ventricular diastolic parameters are consistent with Grade II diastolic dysfunction (pseudonormalization).  Right Ventricle: The right ventricular size is normal. No increase in right ventricular wall thickness. Right ventricular systolic function is normal. There is mildly elevated pulmonary artery systolic pressure. The tricuspid regurgitant velocity is 3.03 m/s, and with an assumed right atrial pressure of 3 mmHg, the estimated right ventricular systolic pressure is 39.7 mmHg.  Left Atrium: Left atrial size was moderately dilated.  Right Atrium: Right atrial size was normal in size.  Pericardium: There is no evidence of pericardial effusion.  Mitral Valve: Eccentric posteriorly directed MR jet makes quantificaiton difficult but at least moderate. No pulmonary vein reversal. Appears similar to previous echocardiogram. The mitral valve is abnormal. Moderate to severe mitral valve regurgitation. No evidence of mitral valve stenosis. MV peak gradient, 1.3 mmHg. The mean mitral valve gradient is 1.0 mmHg.  Tricuspid Valve: The tricuspid valve  is normal in structure. Tricuspid valve regurgitation is mild . No evidence of tricuspid stenosis.  Aortic Valve: The aortic valve is tricuspid. Aortic valve regurgitation is mild to moderate. Aortic regurgitation PHT measures 812 msec. No aortic stenosis is present. Aortic valve mean gradient measures 4.0 mmHg. Aortic valve peak gradient measures 6.7 mmHg. Aortic valve area, by VTI measures 2.26 cm.  Pulmonic Valve: The pulmonic valve was normal in structure. Pulmonic valve regurgitation is not visualized. No evidence of pulmonic stenosis.  Aorta: The aortic root is normal in size and structure.  Venous: The inferior vena cava is normal in size with greater than 50% respiratory variability,  suggesting right atrial pressure of 3 mmHg.  IAS/Shunts: No atrial level shunt detected by color flow Doppler.  Additional Comments: 3D imaging was not performed.   LEFT VENTRICLE PLAX 2D LVIDd:         5.80 cm      Diastology LVIDs:         4.00 cm      LV e' medial:    3.24 cm/s LV PW:         1.20 cm      LV E/e' medial:  17.2 LV IVS:        1.20 cm      LV e' lateral:   5.35 cm/s LVOT diam:     2.20 cm      LV E/e' lateral: 10.4 LV SV:         48 LV SV Index:   21 LVOT Area:     3.80 cm  LV Volumes (MOD) LV vol d, MOD A2C: 287.0 ml LV vol d, MOD A4C: 278.0 ml LV vol s, MOD A2C: 111.0 ml LV vol s, MOD A4C: 123.0 ml LV SV MOD A2C:     176.0 ml LV SV MOD A4C:     278.0 ml LV SV MOD BP:      171.1 ml  RIGHT VENTRICLE             IVC RV Basal diam:  4.30 cm     IVC diam: 1.60 cm RV Mid diam:    2.90 cm RV S prime:     15.50 cm/s TAPSE (M-mode): 4.0 cm  LEFT ATRIUM             Index        RIGHT ATRIUM           Index LA diam:        3.20 cm 1.38 cm/m   RA Area:     17.30 cm LA Vol (A2C):   69.5 ml 29.92 ml/m  RA Volume:   39.00 ml  16.79 ml/m LA Vol (A4C):   98.5 ml 42.41 ml/m LA Biplane Vol: 83.5 ml 35.95 ml/m AORTIC VALVE                    PULMONIC VALVE AV Area (Vmax):     2.15 cm     PV Vmax:       0.85 m/s AV Area (Vmean):   1.92 cm     PV Peak grad:  2.9 mmHg AV Area (VTI):     2.26 cm AV Vmax:           129.00 cm/s AV Vmean:          91.400 cm/s AV VTI:            0.212 m AV Peak Grad:      6.7 mmHg AV Mean Grad:      4.0 mmHg LVOT Vmax:         73.10 cm/s LVOT Vmean:        46.100 cm/s LVOT VTI:          0.126 m LVOT/AV VTI ratio: 0.59 AI PHT:            812 msec  AORTA Ao Root diam: 3.60 cm Ao Asc diam:  3.90 cm  MITRAL VALVE               TRICUSPID VALVE MV Area (PHT): 3.08 cm    TR Peak grad:  36.7 mmHg MV Area VTI:   2.90 cm    TR Vmax:        303.00 cm/s MV Peak grad:  1.3 mmHg MV Mean grad:  1.0 mmHg    SHUNTS MV Vmax:       0.56 m/s    Systemic VTI:  0.13 m MV Vmean:      41.6 cm/s   Systemic Diam: 2.20 cm MV Decel Time: 246 msec MR Peak grad: 88.5 mmHg MR Mean grad: 51.5 mmHg MR Vmax:      470.50 cm/s MR Vmean:     329.5 cm/s MV E velocity: 55.70 cm/s MV A velocity: 49.70 cm/s MV E/A ratio:  1.12  Arta Lark Electronically signed by Arta Lark Signature Date/Time: 08/30/2023/12:18:53 PM    Final   TEE  ECHO TEE 10/14/2023  Narrative TRANSESOPHOGEAL ECHO REPORT    Patient Name:   GOTTLIEB ZUERCHER Date of Exam: 10/14/2023 Medical Rec #:  [id number]    Height:       72.0 in Accession #:    [id number]   Weight:       239.0 lb Date of Birth:  26-Apr-1935    BSA:          2.298 m Patient Age:    88 years     BP:           180/112 mmHg Patient Gender: M            HR:           73 bpm. Exam Location:  Inpatient  Procedure: Transesophageal Echo, Cardiac Doppler and Color Doppler (Both Spectral and Color Flow Doppler were utilized during procedure).  Indications:     Mitral Valve Abnormality  History:         Patient has prior history of Echocardiogram examinations, most recent 09/20/2023. CHF, Mitral Valve Disease, Arrythmias:Atrial Fibrillation; Risk Factors:Hypertension.  Sonographer:     Astrid Blamer Referring Phys:  [phone] MIHAI CROITORU Diagnosing Phys: Luana Rumple MD  PROCEDURE: After discussion of the risks and benefits of a TEE, an informed consent was obtained from the patient. The transesophogeal probe was passed without difficulty through the esophogus of the patient. Sedation performed by different physician. The patient was monitored while under deep sedation. Anesthestetic sedation was provided intravenously by Anesthesiology: 90mg  of Propofol , 40mg  of Lidocaine . The patient developed no complications during the procedure.  IMPRESSIONS   1. There is substantial left ventricular contractile dyssynchrony, even though the ECG does not show a typical LBBB. Left ventricular ejection fraction, by estimation, is 45 to 50%. The left ventricle has mildly decreased function. The left ventricle demonstrates global hypokinesis. The left ventricular internal cavity size was mildly to moderately dilated. 2. Right ventricular systolic function is normal. The right ventricular size is normal. There is moderately elevated pulmonary artery systolic pressure. The estimated right ventricular systolic pressure is 59.8 mmHg. 3. Left atrial size was severely dilated. No left atrial/left atrial appendage thrombus was detected. 4. 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 By the PISA method the effective regurgitant orifice area is 0.31 cm sq, regurgitant volume 50 mL, regurgitant fraction 45%. Suspect these values underestimate the true severity of the mitral insufficiency. The mitral valve area is very large (>10 cm sq) and the mean gradient is <2 mm Hg at a heart rate of 77 bpm. The posterior leaflet length is 14  cm. The mitral valve is abnormal. Moderate to severe mitral valve regurgitation. No evidence of mitral stenosis. 5. The aortic valve is tricuspid. There is mild thickening of  the aortic valve. Aortic valve regurgitation is mild. Aortic valve sclerosis is present, with no evidence of aortic valve stenosis. 6. There is mild (Grade II) plaque involving the descending aorta. 7. Evidence of atrial level shunting detected by color flow Doppler. There is a small patent foramen ovale with predominantly left to right shunting across the atrial septum. 8. 3D performed of the mitral valve.  Comparison(s): Left ventricular systolic function was overestimated on the previous transthoracic echo. Mitral anatomy is unchanged from TEE in 2019, but the left ventricle has dilated.  FINDINGS Left Ventricle: There is substantial left ventricular contractile dyssynchrony, even though the ECG does not show a typical LBBB. Left ventricular ejection fraction, by estimation, is 45 to 50%. The left ventricle has mildly decreased function. The left ventricle demonstrates global hypokinesis. The left ventricular internal cavity size was mildly to moderately dilated. There is no left ventricular hypertrophy.  Right Ventricle: The right ventricular size is normal. No increase in right ventricular wall thickness. Right ventricular systolic function is normal. There is moderately elevated pulmonary artery systolic pressure. The tricuspid regurgitant velocity is 3.70 m/s, and with an assumed right atrial pressure of 5 mmHg, the estimated right ventricular systolic pressure is 59.8 mmHg.  Left Atrium: Left atrial size was severely dilated. No left atrial/left atrial appendage thrombus was detected.  Right Atrium: Right atrial size was normal in size.  Pericardium: There is no evidence of pericardial effusion.  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 By the PISA method the effective regurgitant orifice area is 0.31 cm sq, regurgitant volume 50 mL, regurgitant fraction 45%.  Suspect these values underestimate the true severity of the mitral insufficiency. The mitral valve area is very large (>10 cm sq) and the mean gradient is <2 mm Hg at a heart rate of 77 bpm. The posterior leaflet length is 14 cm. The mitral valve is abnormal. Moderate to severe mitral valve regurgitation, with eccentric medially directed jet. No evidence of mitral valve stenosis. The mean mitral valve gradient is 1.7 mmHg with average heart rate of 77 bpm.  Tricuspid Valve: The tricuspid valve is normal in structure. Tricuspid valve regurgitation is mild.  Aortic Valve: The aortic valve is tricuspid. There is mild thickening of the aortic valve. Aortic valve regurgitation is mild. Aortic valve sclerosis is present, with no evidence of aortic valve stenosis.  Pulmonic Valve: The pulmonic valve was grossly normal. Pulmonic valve regurgitation is not visualized. No evidence of pulmonic stenosis.  Aorta: The aortic root, ascending aorta, aortic arch and descending aorta are all structurally normal, with no evidence of dilitation or obstruction. There is mild (Grade II) plaque involving the descending aorta.  Venous: A systolic blunting flow pattern is recorded from the left upper pulmonary vein and the right upper pulmonary vein.  IAS/Shunts: Evidence of atrial level shunting detected by color flow Doppler. A small patent foramen ovale is detected with predominantly left to right shunting across the atrial septum.   LEFT VENTRICLE PLAX 2D LVOT diam:     2.30 cm LV SV:         60 LV SV Index:   26 LVOT Area:     4.15 cm   AORTIC VALVE LVOT Vmax:  86.46 cm/s LVOT Vmean:  54.325 cm/s LVOT VTI:    0.145 m  MITRAL VALVE                 TRICUSPID VALVE MV Area (plan): 10.48 cm    TR Peak grad:   54.8 mmHg MV Mean grad:   1.7 mmHg     TR Vmax:        370.00 cm/s MR Peak grad:   102.5 mmHg MR Vmax:        506.15 cm/s  SHUNTS MR Vmean:       369.4 cm/s   Systemic VTI:  0.15 m MR PISA:         4.22 cm     Systemic Diam: 2.30 cm MR PISA Radius: 0.82 cm  Mihai Croitoru MD Electronically signed by Luana Rumple MD Signature Date/Time: 10/14/2023/2:27:01 PM    Final  MONITORS  LONG TERM MONITOR (3-14 DAYS) 07/09/2020  Narrative Patch Wear Time:  3 days and 1 hours (2021-12-22T17:11:14-0500 to 2021-12-25T18:51:40-0500)  Patient had a min HR of 45 bpm, max HR of 182 bpm, and avg HR of 68 bpm. Predominant underlying rhythm was Sinus Rhythm. Bundle Branch Block/IVCD was present. 1 run of Ventricular Tachycardia occurred lasting 4 beats with a max rate of 130 bpm (avg 114 bpm). 14 Supraventricular Tachycardia runs occurred, the run with the fastest interval lasting 5 beats with a max rate of 182 bpm, the longest lasting 20 beats with an avg rate of 105 bpm. Idioventricular Rhythm was present. Isolated SVEs were occasional (2.9%, 8418), SVE Couplets were rare (<1.0%, 765), and SVE Triplets were rare (<1.0%, 152). Isolated VEs were occasional (1.8%, 5298), VE Couplets were rare (<1.0%, 103), and VE Triplets were rare (<1.0%, 9). Ventricular Bigeminy and Trigeminy were Present.  Study reviewed: sinus rhythm with average HR 68 bpm, occasional PVC's, rare supraventricular beats. There are rare supraventricular runs, but HR only mildly increased at 105 bpm during the longest run.       ______________________________________________________________________________________________      EKG:   EKG Interpretation Date/Time:  Tuesday December 13 2023 14:14:21 EDT Ventricular Rate:  77 PR Interval:  174 QRS Duration:  152 QT Interval:  464 QTC Calculation: 525 R Axis:   -77  Text Interpretation: Sinus rhythm with occasional Premature ventricular complexes Left axis deviation Right bundle branch block When compared with ECG of 14-Nov-2023 10:41, No significant change was found Confirmed by Arnoldo Lapping [phone]) on 12/13/2023 2:18:35 PM    Recent Labs: 08/29/2023: B Natriuretic Peptide  417.0 12/13/2023: ALT 9; BUN 27; Creatinine, Ser 1.09; Hemoglobin 14.9; NT-Pro BNP 841; Platelets 164; Potassium 4.3; Sodium 143  Recent Lipid Panel    Component Value Date/Time   CHOL 213 (H) 11/15/2022 1329   TRIG 215 (H) 11/15/2022 1329   HDL 42 11/15/2022 1329   CHOLHDL 5.1 (H) 11/15/2022 1329   CHOLHDL 3.8 04/22/2019 1759   VLDL 24 04/22/2019 1759   LDLCALC 133 (H) 11/15/2022 1329           Physical Exam:    VS:  BP (!) 148/84   Pulse 78   Ht 6' (1.829 m)   Wt 239 lb 6.4 oz (108.6 kg)   SpO2 95%   BMI 32.47 kg/m     Wt Readings from Last 3 Encounters:  12/13/23 239 lb 6.4 oz (108.6 kg)  12/02/23 243 lb 3.2 oz (110.3 kg)  11/14/23 240 lb (108.9 kg)     GEN:  Well nourished, well  developed in no acute distress HEENT: Normal NECK: No JVD; No carotid bruits LYMPHATICS: No lymphadenopathy CARDIAC: RRR, 3/6 holosystolic murmur at the apex RESPIRATORY:  Clear to auscultation without rales, wheezing or rhonchi  ABDOMEN: Soft, non-tender, non-distended MUSCULOSKELETAL:  No edema; No deformity  SKIN: Warm and dry NEUROLOGIC:  Alert and oriented x 3 PSYCHIATRIC:  Normal affect    STS SCORE  Procedure Type: Isolated MVR (replacement) Perioperative Outcome Estimate % Operative Mortality 3.24% Morbidity & Mortality 11.9% Stroke 1.57% Renal Failure 1.15% Reoperation 3.99% Prolonged Ventilation 5.76% Deep Sternal Wound Infection 0.053% Long Hospital Stay (>14 days) 4.47% Short Hospital Stay (<6 days)* 27.1%  Procedure Type: Isolated MVr (repair) Perioperative Outcome Estimate % Operative Mortality 1.82% Morbidity & Mortality 6.96% Stroke 1.45% Renal Failure 0.738% Reoperation 2.52% Prolonged Ventilation 3.8% Deep Sternal Wound Infection 0.035% Long Hospital Stay (>14 days) 2.66% Short Hospital Stay (<6 days)* 44.1%   Assessment & Plan Nonrheumatic mitral valve regurgitation The patient has 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  The effective regurgitant orifice is measured at 0.31 cm with a regurgitant volume of 50 mL and regurgitant fraction of 45%.  These numbers are felt to underestimate the true severity of mitral insufficiency because of such an eccentric jet.  Today, I reviewed the natural history of mitral regurgitation and associated symptoms of NYHA functional class II chronic diastolic heart failure with the patient.  We reviewed specific treatment options including continued medical therapy, transcatheter edge-to-edge repair of the mitral valve, and surgical mitral valve repair or replacement.  He has undergone formal cardiac surgical consultation and is not a candidate for surgery due to factors outlined above with advanced age, mild dementia, and reduced functional capacity.  Transcatheter edge-to-edge repair of the mitral valve is a reasonable treatment alternative.  I discussed the risks, indications, and alternatives to transcatheter mitral valve repair with the patient at length.  He understands and agrees to proceed.  I demonstrated a procedural animation to him and explained each important step of the procedure. Specific risks include vascular injury, bleeding, infection, arrhythmia, myocardial infarction, stroke, cardiac perforation, cardiac tamponade, device embolization, single leaflet detachment, endocarditis, mitral valve injury, emergency surgery, and death. They understand the risk of serious complication occurs at a rate of approximately 2%. After this discussion, the patient provides full informed consent for surgery.   Chronic heart failure with mildly reduced ejection fraction (HFmrEF, 41-49%) (HCC) No evidence of volume overload on exam.  NYHA functional class II symptoms noted.  Continue current management. Pre-procedure lab exam Will check pre-procedure labs       Medication Adjustments/Labs and  Tests Ordered: Current medicines are reviewed at length with the patient today.  Concerns regarding medicines are outlined above.  Orders Placed This Encounter  Procedures   Comprehensive metabolic panel with GFR   CBC   Protime-INR   Pro b natriuretic peptide (BNP)   EKG 12-Lead   No orders of the defined types were placed in this encounter.   Patient Instructions  Medication Instructions:  Your physician recommends that you continue on your current medications as directed. Please refer to the Current Medication list given to you today.  *If you need a refill on your cardiac medications before your next appointment, please call your pharmacy*  Lab Work: CMET, CBC, PT-INR, BNP today If you have labs (blood work) drawn today and your tests are completely normal, you will  receive your results only by: MyChart Message (if you have MyChart) OR A paper copy in the mail If you have any lab test that is abnormal or we need to change your treatment, we will call you to review the results.  Testing/Procedures: Mitral Valve Repair - as scheduled  Follow-Up: At [facility], you and your health needs are our priority.  As part of our continuing mission to provide you with exceptional heart care, our providers are all part of one team.  This team includes your primary Cardiologist (physician) and Advanced Practice Providers or APPs (Physician Assistants and Nurse Practitioners) who all work together to provide you with the care you need, when you need it.  Your next appointment:   Follow up to be scheduled       Signed, Arnoldo Lapping, MD  12/16/2023 4:57 PM    [facility]

## 2023-12-13 NOTE — Progress Notes (Signed)
 Cardiology Office Note:    Date:  12/16/2023   ID:  [name], DOB 12/30/1934, MRN [id number]  PCP:  Margarete Sharps, MD   [facility] Providers Cardiologist:  Arnoldo Lapping, MD Cardiology APP:  Gabino Joe, PA-C  Electrophysiologist:  Jolly Needle, MD (Inactive)  Structural Heart:  Arnoldo Lapping, MD    Referring MD: Margarete Sharps, MD   Chief Complaint  Patient presents with   Shortness of Breath    History of Present Illness:    [name] is a 89 y.o. male presenting for follow-up of severe mitral regurgitation.  The patient has a history of HFpEF and was admitted with decompensated heart failure in February 2025.  He was noted to have at least moderately severe mitral regurgitation and underwent transesophageal echo demonstrating severe eccentric mitral regurgitation with a flail P1 scallop.  He has remained limited by shortness of breath with a NYHA functional class II limitation.  The patient is also undergone right and left heart catheterization demonstrating patent coronary arteries with moderate three-vessel disease, diffuse calcification, and no focal high-grade stenoses.  Wedge pressure was 20 mmHg with a V wave of 27 mmHg and mean PA pressure was 24 mmHg with preserved cardiac output of 4.9 L/min.  The patient is here alone today.  He has undergone formal surgical evaluation by Dr. Deloise Ferries who deemed him a nonoperative candidate due to age, reduced functional capacity, and presence of mild dementia.  The patient states that his symptoms are unchanged.  He continues to be limited by shortness of breath with low to moderate level activities.  He is short of breath with walking or going up a flight of stairs.  He denies orthopnea or PND.  He has had no leg swelling or chest pain.   Current Medications: Current Meds  Medication Sig   apixaban  (ELIQUIS ) 5 MG TABS tablet Take 1 tablet (5 mg total) by mouth 2 (two) times daily.   ascorbic acid (VITAMIN C) 500 MG  tablet Take 500 mg by mouth daily.   Carboxymethylcellulose Sodium (LUBRICANT EYE DROPS OP) Place 1 drop into both eyes daily as needed (dry eyes).   carvedilol  (COREG ) 3.125 MG tablet Take 1 tablet (3.125 mg total) by mouth 2 (two) times daily with a meal.   Cholecalciferol (VITAMIN D) 50 MCG (2000 UT) tablet Take 2,000 Units by mouth daily.   Cyanocobalamin (B-12) 2500 MCG TABS Take 2,500 mcg by mouth daily.   furosemide  (LASIX ) 20 MG tablet Take 1 tablet (20 mg total) by mouth daily.   sacubitril -valsartan  (ENTRESTO ) 24-26 MG Take 1 tablet by mouth 2 (two) times daily.     Allergies:   Aspirin    ROS:   Please see the history of present illness.     All other systems reviewed and are negative.  EKGs/Labs/Other Studies Reviewed:    The following studies were reviewed today: Cardiac Studies & Procedures   ______________________________________________________________________________________________ CARDIAC CATHETERIZATION  CARDIAC CATHETERIZATION 11/14/2023  Conclusion   Ost LM to Mid LM lesion is 20% stenosed.   Dist LAD lesion is 70% stenosed with 60% stenosed side branch in 2nd Diag.   Ost Cx lesion is 30% stenosed.   Prox Cx lesion is 50% stenosed.   Prox Cx to Mid Cx lesion is 35% stenosed.   1st Mrg lesion is 60% stenosed.   RPDA lesion is 60% stenosed.   Ost LAD to Prox LAD lesion is 50% stenosed.  1.  Moderate three-vessel coronary artery disease with diffuse  calcification and nonobstructive stenosis of a large, dominant RCA, patency of the left main with mild nonobstructive plaque, diffusely calcified LAD with moderate proximal and mid vessel stenoses in the range of 50 to 60%, and mild to moderate circumflex/OM stenoses up to 60% 2.  Right heart catheterization data: RA mean 7 mmHg PA pressure 51/6 with a mean of 24 mmHg Wedge pressure A-wave 24, V wave 27, mean 20 mmHg Cardiac output 4.87 L/min with cardiac index 2.1 L/min/m Transpulmonary gradient 4 mmHg with PVR  of 0.8 Wood units  Plan: medical therapy for diffuse CAD, continue evaluation for mitral TEER  Findings Coronary Findings Diagnostic  Dominance: Right  Left Main The left main is patent with moderate calcification and mild nonobstructive stenosis Ost LM to Mid LM lesion is 20% stenosed. The lesion is concentric. The lesion is mildly calcified.  Left Anterior Descending There is mild diffuse disease throughout the vessel. The vessel is calcified. Proximal third of the vessel is mild to moderately calcified The LAD is diffusely diseased with severe proximal calcification.  The ostium and proximal portions of the LAD have 50% stenosis.  The mid LAD has 60 to 70% stenosis at the second diagonal which is also diseased with moderate 60% stenosis. Ost LAD to Prox LAD lesion is 50% stenosed. Dist LAD lesion is 70% stenosed with 60% stenosed side branch in 2nd Diag. The lesion is located at the major branch and eccentric. The downstream LAD beyond this appeared to be relatively small in diameter.  Lesion does not appear to be flow-limiting  First Diagonal Branch Vessel is small in size.  Second Diagonal Branch Vessel is small in size. There is mild disease in the vessel.  Second Septal Branch Vessel is small in size.  Left Circumflex There is mild diffuse disease throughout the vessel. The circumflex is patent with moderate diffuse calcification.  The first OM has 60% diffuse stenosis.  The AV circumflex has 50% stenosis in its proximal portion just beyond the origin of the high first OM. Ost Cx lesion is 30% stenosed. Ostial The lesion is calcified. Prox Cx lesion is 50% stenosed. The lesion is focal. Prox Cx to Mid Cx lesion is 35% stenosed. The lesion is irregular.  First Obtuse Marginal Branch Vessel is small in size. There is mild disease in the vessel. 1st Mrg lesion is 60% stenosed.  Left Posterior Atrioventricular Artery Vessel is small in size.  Right Coronary Artery Vessel is  large. Actually very large There is mild diffuse disease throughout the vessel. Large, dominant vessel with diffuse calcification and mild ectasia.  The PDA has scattered 50% stenoses but no severe or high-grade obstruction.  The PLA is patent.  Acute Marginal Branch Vessel is small in size.  Right Posterior Descending Artery Vessel is large in size. Wraparound PDA RPDA lesion is 60% stenosed.  First Right Posterolateral Branch Vessel is small in size.  Intervention  No interventions have been documented.   CARDIAC CATHETERIZATION  CARDIAC CATHETERIZATION 04/25/2019  Conclusion  Hemodynamic findings consistent with mild pulmonary hypertension - mean PAP 28 mmHg  LV end diastolic pressure is mildly elevated. - LVEDP 12-15 mmHg  Cardiac output-index 7.44-3.27.  ---------------------------------  Ost LM to Mid LM lesion is 20% stenosed. Ost Cx lesion is 30% stenosed. -- calcified  1st Mrg lesion is 60% stenosed.  Dist LAD lesion is 55% stenosed with 55% stenosed side branch in 2nd Diag. - the downstream LAD is relatively the same diameter as noted in the stenosis.  Otherwise minimal to mild disease throughout.  SUMMARY  Diffuse mild to moderate CAD: Most notable 60% proximal OM1, 50 to 60% mid LAD at D2.  No obvious culprit lesion to explain significantly reduced EF.  Mild proximal LM and ostial LCx-calcified lesions.  Mild pulmonary hypertension with mean PA pressure of 28 mmHg.  LVEDP and PCWP from 12 to 15 mmHg.  Borderline CO-CI  RECOMMENDATIONS  Return to nursing unit for ongoing care, TR band removal.  Would continue aggressive risk factor modification and heart failure medications.  Appears to be at relatively well diuresed, would titrate up afterload reduction  If he has any concerning symptoms for possible angina, would consider noninvasive evaluation with Myoview , or perhaps coronary CTA to determine presence of either anterior or lateral ischemia  suggesting LAD or OM lesions are more significant than they appear angiographically.    Randene Bustard, M.D., M.S. Interventional Cardiologist  Pager # [phone] Phone # [phone] [address] [place], [place] [address]  Findings Coronary Findings Diagnostic  Dominance: Right  Left Main Ost LM to Mid LM lesion is 20% stenosed. The lesion is concentric. The lesion is mildly calcified.  Left Anterior Descending There is mild diffuse disease throughout the vessel. The vessel is calcified. Proximal third of the vessel is mild to moderately calcified Dist LAD lesion is 55% stenosed with 55% stenosed side branch in 2nd Diag. The lesion is located at the major branch and eccentric. The downstream LAD beyond this appeared to be relatively small in diameter.  Lesion does not appear to be flow-limiting  First Diagonal Branch Vessel is small in size.  Second Diagonal Branch Vessel is small in size. There is mild disease in the vessel.  Second Septal Branch Vessel is small in size.  Left Circumflex There is mild diffuse disease throughout the vessel. Ost Cx lesion is 30% stenosed. Ostial The lesion is calcified. Prox Cx lesion is 25% stenosed. The lesion is focal. Prox Cx to Mid Cx lesion is 35% stenosed. The lesion is irregular.  First Obtuse Marginal Branch Vessel is small in size. There is mild disease in the vessel. 1st Mrg lesion is 60% stenosed.  Left Posterior Atrioventricular Artery Vessel is small in size.  Right Coronary Artery Vessel is large. Actually very large The vessel exhibits minimal luminal irregularities.  Acute Marginal Branch Vessel is small in size.  Right Posterior Descending Artery Vessel is large in size. Wraparound PDA  First Right Posterolateral Branch Vessel is small in size.  Intervention  No interventions have been documented.   STRESS TESTS  MYOCARDIAL PERFUSION IMAGING 10/01/2021  Narrative   Findings are  consistent with prior myocardial infarction and no peri-infarct ischemia. The study is intermediate risk due to depresesd ejection fraction.   No ST deviation was noted.   Left ventricular function is abnormal. Global function is moderately reduced. Nuclear stress EF: 41 %. The left ventricular ejection fraction is moderately decreased (30-44%). End diastolic cavity size is moderately enlarged.   Prior study not available for comparison.   ECHOCARDIOGRAM  ECHOCARDIOGRAM COMPLETE 08/30/2023  Narrative ECHOCARDIOGRAM REPORT    Patient Name:   [name] Date of Exam: 08/30/2023 Medical Rec #:  [id number]    Height:       72.0 in Accession #:    [id number]   Weight:       245.0 lb Date of Birth:  02/25/1935    BSA:          2.322 m Patient Age:  88 years     BP:           157/89 mmHg Patient Gender: M            HR:           77 bpm. Exam Location:  Inpatient  Procedure: 2D Echo, Cardiac Doppler and Color Doppler (Both Spectral and Color Flow Doppler were utilized during procedure).  Indications:    CHF-acute diastolic  History:        Patient has prior history of Echocardiogram examinations, most recent 10/18/2022. CHF, CAD, Stroke, Arrythmias:Atrial Fibrillation and RBBB, Signs/Symptoms:Fatigue; Risk Factors:Hypertension, Sleep Apnea, Former Smoker and Dyslipidemia.  Sonographer:    Juanita Shaw Referring Phys: [id number] CAROLE N HALL  IMPRESSIONS   1. Left ventricular ejection fraction, by estimation, is 60 to 65%. The left ventricle has normal function. The left ventricle has no regional wall motion abnormalities. Left ventricular diastolic parameters are consistent with Grade II diastolic dysfunction (pseudonormalization). 2. Right ventricular systolic function is normal. The right ventricular size is normal. There is mildly elevated pulmonary artery systolic pressure. The estimated right ventricular systolic pressure is 39.7 mmHg. 3. Left atrial size was moderately  dilated. 4. Eccentric posteriorly directed MR jet makes quantificaiton difficult but at least moderate. No pulmonary vein reversal. Appears similar to previous echocardiogram. The mitral valve is abnormal. Moderate to severe mitral valve regurgitation. No evidence of mitral stenosis. 5. The aortic valve is tricuspid. Aortic valve regurgitation is mild to moderate. No aortic stenosis is present. 6. The inferior vena cava is normal in size with greater than 50% respiratory variability, suggesting right atrial pressure of 3 mmHg.  FINDINGS Left Ventricle: Left ventricular ejection fraction, by estimation, is 60 to 65%. The left ventricle has normal function. The left ventricle has no regional wall motion abnormalities. Strain imaging was not performed. The left ventricular internal cavity size was normal in size. There is no left ventricular hypertrophy. Left ventricular diastolic parameters are consistent with Grade II diastolic dysfunction (pseudonormalization).  Right Ventricle: The right ventricular size is normal. No increase in right ventricular wall thickness. Right ventricular systolic function is normal. There is mildly elevated pulmonary artery systolic pressure. The tricuspid regurgitant velocity is 3.03 m/s, and with an assumed right atrial pressure of 3 mmHg, the estimated right ventricular systolic pressure is 39.7 mmHg.  Left Atrium: Left atrial size was moderately dilated.  Right Atrium: Right atrial size was normal in size.  Pericardium: There is no evidence of pericardial effusion.  Mitral Valve: Eccentric posteriorly directed MR jet makes quantificaiton difficult but at least moderate. No pulmonary vein reversal. Appears similar to previous echocardiogram. The mitral valve is abnormal. Moderate to severe mitral valve regurgitation. No evidence of mitral valve stenosis. MV peak gradient, 1.3 mmHg. The mean mitral valve gradient is 1.0 mmHg.  Tricuspid Valve: The tricuspid valve  is normal in structure. Tricuspid valve regurgitation is mild . No evidence of tricuspid stenosis.  Aortic Valve: The aortic valve is tricuspid. Aortic valve regurgitation is mild to moderate. Aortic regurgitation PHT measures 812 msec. No aortic stenosis is present. Aortic valve mean gradient measures 4.0 mmHg. Aortic valve peak gradient measures 6.7 mmHg. Aortic valve area, by VTI measures 2.26 cm.  Pulmonic Valve: The pulmonic valve was normal in structure. Pulmonic valve regurgitation is not visualized. No evidence of pulmonic stenosis.  Aorta: The aortic root is normal in size and structure.  Venous: The inferior vena cava is normal in size with greater than 50% respiratory variability,  suggesting right atrial pressure of 3 mmHg.  IAS/Shunts: No atrial level shunt detected by color flow Doppler.  Additional Comments: 3D imaging was not performed.   LEFT VENTRICLE PLAX 2D LVIDd:         5.80 cm      Diastology LVIDs:         4.00 cm      LV e' medial:    3.24 cm/s LV PW:         1.20 cm      LV E/e' medial:  17.2 LV IVS:        1.20 cm      LV e' lateral:   5.35 cm/s LVOT diam:     2.20 cm      LV E/e' lateral: 10.4 LV SV:         48 LV SV Index:   21 LVOT Area:     3.80 cm  LV Volumes (MOD) LV vol d, MOD A2C: 287.0 ml LV vol d, MOD A4C: 278.0 ml LV vol s, MOD A2C: 111.0 ml LV vol s, MOD A4C: 123.0 ml LV SV MOD A2C:     176.0 ml LV SV MOD A4C:     278.0 ml LV SV MOD BP:      171.1 ml  RIGHT VENTRICLE             IVC RV Basal diam:  4.30 cm     IVC diam: 1.60 cm RV Mid diam:    2.90 cm RV S prime:     15.50 cm/s TAPSE (M-mode): 4.0 cm  LEFT ATRIUM             Index        RIGHT ATRIUM           Index LA diam:        3.20 cm 1.38 cm/m   RA Area:     17.30 cm LA Vol (A2C):   69.5 ml 29.92 ml/m  RA Volume:   39.00 ml  16.79 ml/m LA Vol (A4C):   98.5 ml 42.41 ml/m LA Biplane Vol: 83.5 ml 35.95 ml/m AORTIC VALVE                    PULMONIC VALVE AV Area (Vmax):     2.15 cm     PV Vmax:       0.85 m/s AV Area (Vmean):   1.92 cm     PV Peak grad:  2.9 mmHg AV Area (VTI):     2.26 cm AV Vmax:           129.00 cm/s AV Vmean:          91.400 cm/s AV VTI:            0.212 m AV Peak Grad:      6.7 mmHg AV Mean Grad:      4.0 mmHg LVOT Vmax:         73.10 cm/s LVOT Vmean:        46.100 cm/s LVOT VTI:          0.126 m LVOT/AV VTI ratio: 0.59 AI PHT:            812 msec  AORTA Ao Root diam: 3.60 cm Ao Asc diam:  3.90 cm  MITRAL VALVE               TRICUSPID VALVE MV Area (PHT): 3.08 cm    TR Peak grad:  36.7 mmHg MV Area VTI:   2.90 cm    TR Vmax:        303.00 cm/s MV Peak grad:  1.3 mmHg MV Mean grad:  1.0 mmHg    SHUNTS MV Vmax:       0.56 m/s    Systemic VTI:  0.13 m MV Vmean:      41.6 cm/s   Systemic Diam: 2.20 cm MV Decel Time: 246 msec MR Peak grad: 88.5 mmHg MR Mean grad: 51.5 mmHg MR Vmax:      470.50 cm/s MR Vmean:     329.5 cm/s MV E velocity: 55.70 cm/s MV A velocity: 49.70 cm/s MV E/A ratio:  1.12  Arta Lark Electronically signed by Arta Lark Signature Date/Time: 08/30/2023/12:18:53 PM    Final   TEE  ECHO TEE 10/14/2023  Narrative TRANSESOPHOGEAL ECHO REPORT    Patient Name:   [name] Date of Exam: 10/14/2023 Medical Rec #:  [id number]    Height:       72.0 in Accession #:    [id number]   Weight:       239.0 lb Date of Birth:  26-Apr-1935    BSA:          2.298 m Patient Age:    88 years     BP:           180/112 mmHg Patient Gender: M            HR:           73 bpm. Exam Location:  Inpatient  Procedure: Transesophageal Echo, Cardiac Doppler and Color Doppler (Both Spectral and Color Flow Doppler were utilized during procedure).  Indications:     Mitral Valve Abnormality  History:         Patient has prior history of Echocardiogram examinations, most recent 09/20/2023. CHF, Mitral Valve Disease, Arrythmias:Atrial Fibrillation; Risk Factors:Hypertension.  Sonographer:     Astrid Blamer Referring Phys:  [phone] MIHAI CROITORU Diagnosing Phys: Luana Rumple MD  PROCEDURE: After discussion of the risks and benefits of a TEE, an informed consent was obtained from the patient. The transesophogeal probe was passed without difficulty through the esophogus of the patient. Sedation performed by different physician. The patient was monitored while under deep sedation. Anesthestetic sedation was provided intravenously by Anesthesiology: 90mg  of Propofol , 40mg  of Lidocaine . The patient developed no complications during the procedure.  IMPRESSIONS   1. There is substantial left ventricular contractile dyssynchrony, even though the ECG does not show a typical LBBB. Left ventricular ejection fraction, by estimation, is 45 to 50%. The left ventricle has mildly decreased function. The left ventricle demonstrates global hypokinesis. The left ventricular internal cavity size was mildly to moderately dilated. 2. Right ventricular systolic function is normal. The right ventricular size is normal. There is moderately elevated pulmonary artery systolic pressure. The estimated right ventricular systolic pressure is 59.8 mmHg. 3. Left atrial size was severely dilated. No left atrial/left atrial appendage thrombus was detected. 4. 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 By the PISA method the effective regurgitant orifice area is 0.31 cm sq, regurgitant volume 50 mL, regurgitant fraction 45%. Suspect these values underestimate the true severity of the mitral insufficiency. The mitral valve area is very large (>10 cm sq) and the mean gradient is <2 mm Hg at a heart rate of 77 bpm. The posterior leaflet length is 14  cm. The mitral valve is abnormal. Moderate to severe mitral valve regurgitation. No evidence of mitral stenosis. 5. The aortic valve is tricuspid. There is mild thickening of  the aortic valve. Aortic valve regurgitation is mild. Aortic valve sclerosis is present, with no evidence of aortic valve stenosis. 6. There is mild (Grade II) plaque involving the descending aorta. 7. Evidence of atrial level shunting detected by color flow Doppler. There is a small patent foramen ovale with predominantly left to right shunting across the atrial septum. 8. 3D performed of the mitral valve.  Comparison(s): Left ventricular systolic function was overestimated on the previous transthoracic echo. Mitral anatomy is unchanged from TEE in 2019, but the left ventricle has dilated.  FINDINGS Left Ventricle: There is substantial left ventricular contractile dyssynchrony, even though the ECG does not show a typical LBBB. Left ventricular ejection fraction, by estimation, is 45 to 50%. The left ventricle has mildly decreased function. The left ventricle demonstrates global hypokinesis. The left ventricular internal cavity size was mildly to moderately dilated. There is no left ventricular hypertrophy.  Right Ventricle: The right ventricular size is normal. No increase in right ventricular wall thickness. Right ventricular systolic function is normal. There is moderately elevated pulmonary artery systolic pressure. The tricuspid regurgitant velocity is 3.70 m/s, and with an assumed right atrial pressure of 5 mmHg, the estimated right ventricular systolic pressure is 59.8 mmHg.  Left Atrium: Left atrial size was severely dilated. No left atrial/left atrial appendage thrombus was detected.  Right Atrium: Right atrial size was normal in size.  Pericardium: There is no evidence of pericardial effusion.  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 By the PISA method the effective regurgitant orifice area is 0.31 cm sq, regurgitant volume 50 mL, regurgitant fraction 45%.  Suspect these values underestimate the true severity of the mitral insufficiency. The mitral valve area is very large (>10 cm sq) and the mean gradient is <2 mm Hg at a heart rate of 77 bpm. The posterior leaflet length is 14 cm. The mitral valve is abnormal. Moderate to severe mitral valve regurgitation, with eccentric medially directed jet. No evidence of mitral valve stenosis. The mean mitral valve gradient is 1.7 mmHg with average heart rate of 77 bpm.  Tricuspid Valve: The tricuspid valve is normal in structure. Tricuspid valve regurgitation is mild.  Aortic Valve: The aortic valve is tricuspid. There is mild thickening of the aortic valve. Aortic valve regurgitation is mild. Aortic valve sclerosis is present, with no evidence of aortic valve stenosis.  Pulmonic Valve: The pulmonic valve was grossly normal. Pulmonic valve regurgitation is not visualized. No evidence of pulmonic stenosis.  Aorta: The aortic root, ascending aorta, aortic arch and descending aorta are all structurally normal, with no evidence of dilitation or obstruction. There is mild (Grade II) plaque involving the descending aorta.  Venous: A systolic blunting flow pattern is recorded from the left upper pulmonary vein and the right upper pulmonary vein.  IAS/Shunts: Evidence of atrial level shunting detected by color flow Doppler. A small patent foramen ovale is detected with predominantly left to right shunting across the atrial septum.   LEFT VENTRICLE PLAX 2D LVOT diam:     2.30 cm LV SV:         60 LV SV Index:   26 LVOT Area:     4.15 cm   AORTIC VALVE LVOT Vmax:  86.46 cm/s LVOT Vmean:  54.325 cm/s LVOT VTI:    0.145 m  MITRAL VALVE                 TRICUSPID VALVE MV Area (plan): 10.48 cm    TR Peak grad:   54.8 mmHg MV Mean grad:   1.7 mmHg     TR Vmax:        370.00 cm/s MR Peak grad:   102.5 mmHg MR Vmax:        506.15 cm/s  SHUNTS MR Vmean:       369.4 cm/s   Systemic VTI:  0.15 m MR PISA:         4.22 cm     Systemic Diam: 2.30 cm MR PISA Radius: 0.82 cm  Mihai Croitoru MD Electronically signed by Luana Rumple MD Signature Date/Time: 10/14/2023/2:27:01 PM    Final  MONITORS  LONG TERM MONITOR (3-14 DAYS) 07/09/2020  Narrative Patch Wear Time:  3 days and 1 hours (2021-12-22T17:11:14-0500 to 2021-12-25T18:51:40-0500)  Patient had a min HR of 45 bpm, max HR of 182 bpm, and avg HR of 68 bpm. Predominant underlying rhythm was Sinus Rhythm. Bundle Branch Block/IVCD was present. 1 run of Ventricular Tachycardia occurred lasting 4 beats with a max rate of 130 bpm (avg 114 bpm). 14 Supraventricular Tachycardia runs occurred, the run with the fastest interval lasting 5 beats with a max rate of 182 bpm, the longest lasting 20 beats with an avg rate of 105 bpm. Idioventricular Rhythm was present. Isolated SVEs were occasional (2.9%, 8418), SVE Couplets were rare (<1.0%, 765), and SVE Triplets were rare (<1.0%, 152). Isolated VEs were occasional (1.8%, 5298), VE Couplets were rare (<1.0%, 103), and VE Triplets were rare (<1.0%, 9). Ventricular Bigeminy and Trigeminy were Present.  Study reviewed: sinus rhythm with average HR 68 bpm, occasional PVC's, rare supraventricular beats. There are rare supraventricular runs, but HR only mildly increased at 105 bpm during the longest run.       ______________________________________________________________________________________________      EKG:   EKG Interpretation Date/Time:  Tuesday December 13 2023 14:14:21 EDT Ventricular Rate:  77 PR Interval:  174 QRS Duration:  152 QT Interval:  464 QTC Calculation: 525 R Axis:   -77  Text Interpretation: Sinus rhythm with occasional Premature ventricular complexes Left axis deviation Right bundle branch block When compared with ECG of 14-Nov-2023 10:41, No significant change was found Confirmed by Arnoldo Lapping [phone]) on 12/13/2023 2:18:35 PM    Recent Labs: 08/29/2023: B Natriuretic Peptide  417.0 12/13/2023: ALT 9; BUN 27; Creatinine, Ser 1.09; Hemoglobin 14.9; NT-Pro BNP 841; Platelets 164; Potassium 4.3; Sodium 143  Recent Lipid Panel    Component Value Date/Time   CHOL 213 (H) 11/15/2022 1329   TRIG 215 (H) 11/15/2022 1329   HDL 42 11/15/2022 1329   CHOLHDL 5.1 (H) 11/15/2022 1329   CHOLHDL 3.8 04/22/2019 1759   VLDL 24 04/22/2019 1759   LDLCALC 133 (H) 11/15/2022 1329           Physical Exam:    VS:  BP (!) 148/84   Pulse 78   Ht 6' (1.829 m)   Wt 239 lb 6.4 oz (108.6 kg)   SpO2 95%   BMI 32.47 kg/m     Wt Readings from Last 3 Encounters:  12/13/23 239 lb 6.4 oz (108.6 kg)  12/02/23 243 lb 3.2 oz (110.3 kg)  11/14/23 240 lb (108.9 kg)     GEN:  Well nourished, well  developed in no acute distress HEENT: Normal NECK: No JVD; No carotid bruits LYMPHATICS: No lymphadenopathy CARDIAC: RRR, 3/6 holosystolic murmur at the apex RESPIRATORY:  Clear to auscultation without rales, wheezing or rhonchi  ABDOMEN: Soft, non-tender, non-distended MUSCULOSKELETAL:  No edema; No deformity  SKIN: Warm and dry NEUROLOGIC:  Alert and oriented x 3 PSYCHIATRIC:  Normal affect    STS SCORE  Procedure Type: Isolated MVR (replacement) Perioperative Outcome Estimate % Operative Mortality 3.24% Morbidity & Mortality 11.9% Stroke 1.57% Renal Failure 1.15% Reoperation 3.99% Prolonged Ventilation 5.76% Deep Sternal Wound Infection 0.053% Long Hospital Stay (>14 days) 4.47% Short Hospital Stay (<6 days)* 27.1%  Procedure Type: Isolated MVr (repair) Perioperative Outcome Estimate % Operative Mortality 1.82% Morbidity & Mortality 6.96% Stroke 1.45% Renal Failure 0.738% Reoperation 2.52% Prolonged Ventilation 3.8% Deep Sternal Wound Infection 0.035% Long Hospital Stay (>14 days) 2.66% Short Hospital Stay (<6 days)* 44.1%   Assessment & Plan Nonrheumatic mitral valve regurgitation The patient has 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  The effective regurgitant orifice is measured at 0.31 cm with a regurgitant volume of 50 mL and regurgitant fraction of 45%.  These numbers are felt to underestimate the true severity of mitral insufficiency because of such an eccentric jet.  Today, I reviewed the natural history of mitral regurgitation and associated symptoms of NYHA functional class II chronic diastolic heart failure with the patient.  We reviewed specific treatment options including continued medical therapy, transcatheter edge-to-edge repair of the mitral valve, and surgical mitral valve repair or replacement.  He has undergone formal cardiac surgical consultation and is not a candidate for surgery due to factors outlined above with advanced age, mild dementia, and reduced functional capacity.  Transcatheter edge-to-edge repair of the mitral valve is a reasonable treatment alternative.  I discussed the risks, indications, and alternatives to transcatheter mitral valve repair with the patient at length.  He understands and agrees to proceed.  I demonstrated a procedural animation to him and explained each important step of the procedure. Specific risks include vascular injury, bleeding, infection, arrhythmia, myocardial infarction, stroke, cardiac perforation, cardiac tamponade, device embolization, single leaflet detachment, endocarditis, mitral valve injury, emergency surgery, and death. They understand the risk of serious complication occurs at a rate of approximately 2%. After this discussion, the patient provides full informed consent for surgery.   Chronic heart failure with mildly reduced ejection fraction (HFmrEF, 41-49%) (HCC) No evidence of volume overload on exam.  NYHA functional class II symptoms noted.  Continue current management. Pre-procedure lab exam Will check pre-procedure labs       Medication Adjustments/Labs and  Tests Ordered: Current medicines are reviewed at length with the patient today.  Concerns regarding medicines are outlined above.  Orders Placed This Encounter  Procedures   Comprehensive metabolic panel with GFR   CBC   Protime-INR   Pro b natriuretic peptide (BNP)   EKG 12-Lead   No orders of the defined types were placed in this encounter.   Patient Instructions  Medication Instructions:  Your physician recommends that you continue on your current medications as directed. Please refer to the Current Medication list given to you today.  *If you need a refill on your cardiac medications before your next appointment, please call your pharmacy*  Lab Work: CMET, CBC, PT-INR, BNP today If you have labs (blood work) drawn today and your tests are completely normal, you will  receive your results only by: MyChart Message (if you have MyChart) OR A paper copy in the mail If you have any lab test that is abnormal or we need to change your treatment, we will call you to review the results.  Testing/Procedures: Mitral Valve Repair - as scheduled  Follow-Up: At [facility], you and your health needs are our priority.  As part of our continuing mission to provide you with exceptional heart care, our providers are all part of one team.  This team includes your primary Cardiologist (physician) and Advanced Practice Providers or APPs (Physician Assistants and Nurse Practitioners) who all work together to provide you with the care you need, when you need it.  Your next appointment:   Follow up to be scheduled       Signed, Arnoldo Lapping, MD  12/16/2023 4:57 PM    [facility]

## 2023-12-13 NOTE — Patient Instructions (Signed)
 Medication Instructions:  Your physician recommends that you continue on your current medications as directed. Please refer to the Current Medication list given to you today.  *If you need a refill on your cardiac medications before your next appointment, please call your pharmacy*  Lab Work: CMET, CBC, PT-INR, BNP today If you have labs (blood work) drawn today and your tests are completely normal, you will receive your results only by: MyChart Message (if you have MyChart) OR A paper copy in the mail If you have any lab test that is abnormal or we need to change your treatment, we will call you to review the results.  Testing/Procedures: Mitral Valve Repair - as scheduled  Follow-Up: At [facility], you and your health needs are our priority.  As part of our continuing mission to provide you with exceptional heart care, our providers are all part of one team.  This team includes your primary Cardiologist (physician) and Advanced Practice Providers or APPs (Physician Assistants and Nurse Practitioners) who all work together to provide you with the care you need, when you need it.  Your next appointment:   Follow up to be scheduled

## 2023-12-14 LAB — COMPREHENSIVE METABOLIC PANEL WITH GFR
ALT: 9 IU/L (ref 0–44)
AST: 11 IU/L (ref 0–40)
Albumin: 4.4 g/dL (ref 3.7–4.7)
Alkaline Phosphatase: 86 IU/L (ref 44–121)
BUN/Creatinine Ratio: 25 — ABNORMAL HIGH (ref 10–24)
BUN: 27 mg/dL (ref 8–27)
Bilirubin Total: 0.4 mg/dL (ref 0.0–1.2)
CO2: 22 mmol/L (ref 20–29)
Calcium: 8.8 mg/dL (ref 8.6–10.2)
Chloride: 104 mmol/L (ref 96–106)
Creatinine, Ser: 1.09 mg/dL (ref 0.76–1.27)
Globulin, Total: 2.6 g/dL (ref 1.5–4.5)
Glucose: 152 mg/dL — ABNORMAL HIGH (ref 70–99)
Potassium: 4.3 mmol/L (ref 3.5–5.2)
Sodium: 143 mmol/L (ref 134–144)
Total Protein: 7 g/dL (ref 6.0–8.5)
eGFR: 66 mL/min/{1.73_m2} (ref >59)

## 2023-12-14 LAB — PRO B NATRIURETIC PEPTIDE: NT-Pro BNP: 841 pg/mL — ABNORMAL HIGH (ref 0–486)

## 2023-12-14 LAB — PROTIME-INR
INR: 1 (ref 0.9–1.2)
Prothrombin Time: 11.5 s (ref 9.1–12.0)

## 2023-12-16 ENCOUNTER — Encounter: Payer: Self-pay | Admitting: Cardiovascular Disease

## 2023-12-16 NOTE — Assessment & Plan Note (Signed)
 The patient has severe mitral regurgitation with evidence of ruptured chordae tendona with a partial flail of the lateral posterior scallop (P1) and eccentric mitral regurgitation.  The effective regurgitant orifice is measured at 0.31 cm with a regurgitant volume of 50 mL and regurgitant fraction of 45%.  These numbers are felt to underestimate the true severity of mitral insufficiency because of such an eccentric jet.  Today, I reviewed the natural history of mitral regurgitation and associated symptoms of NYHA functional class II chronic diastolic heart failure with the patient.  We reviewed specific treatment options including continued medical therapy, transcatheter edge-to-edge repair of the mitral valve, and surgical mitral valve repair or replacement.  He has undergone formal cardiac surgical consultation and is not a candidate for surgery due to factors outlined above with advanced age, mild dementia, and reduced functional capacity.  Transcatheter edge-to-edge repair of the mitral valve is a reasonable treatment alternative.  I discussed the risks, indications, and alternatives to transcatheter mitral valve repair with the patient at length.  He understands and agrees to proceed.  I demonstrated a procedural animation to him and explained each important step of the procedure. Specific risks include vascular injury, bleeding, infection, arrhythmia, myocardial infarction, stroke, cardiac perforation, cardiac tamponade, device embolization, single leaflet detachment, endocarditis, mitral valve injury, emergency surgery, and death. They understand the risk of serious complication occurs at a rate of approximately 2%. After this discussion, the patient provides full informed consent for surgery.

## 2023-12-21 ENCOUNTER — Encounter (HOSPITAL_COMMUNITY)
Admission: RE | Disposition: A | Discharge status: Home / Self Care | Payer: Self-pay | Source: Home / Self Care | Attending: Cardiovascular Disease

## 2023-12-21 ENCOUNTER — Other Ambulatory Visit: Payer: Self-pay

## 2023-12-21 ENCOUNTER — Inpatient Hospital Stay (HOSPITAL_COMMUNITY): Admitting: Certified Registered"

## 2023-12-21 ENCOUNTER — Inpatient Hospital Stay (HOSPITAL_COMMUNITY)
Admission: RE | Admit: 2023-12-21 | Discharge: 2023-12-22 | DRG: 266 | Disposition: A | Discharge status: Home / Self Care | Attending: Cardiovascular Disease | Admitting: Cardiovascular Disease

## 2023-12-21 ENCOUNTER — Encounter (HOSPITAL_COMMUNITY): Payer: Self-pay | Admitting: Cardiovascular Disease

## 2023-12-21 ENCOUNTER — Inpatient Hospital Stay (HOSPITAL_COMMUNITY)

## 2023-12-21 DIAGNOSIS — I4891 Unspecified atrial fibrillation: Secondary | ICD-10-CM | POA: Diagnosis present

## 2023-12-21 DIAGNOSIS — I4510 Unspecified right bundle-branch block: Secondary | ICD-10-CM | POA: Diagnosis present

## 2023-12-21 DIAGNOSIS — E785 Hyperlipidemia, unspecified: Secondary | ICD-10-CM | POA: Diagnosis present

## 2023-12-21 DIAGNOSIS — Z79899 Other long term (current) drug therapy: Secondary | ICD-10-CM | POA: Diagnosis not present

## 2023-12-21 DIAGNOSIS — E663 Overweight: Secondary | ICD-10-CM | POA: Diagnosis present

## 2023-12-21 DIAGNOSIS — I340 Nonrheumatic mitral (valve) insufficiency: Secondary | ICD-10-CM

## 2023-12-21 DIAGNOSIS — I5043 Acute on chronic combined systolic (congestive) and diastolic (congestive) heart failure: Secondary | ICD-10-CM | POA: Diagnosis not present

## 2023-12-21 DIAGNOSIS — G4733 Obstructive sleep apnea (adult) (pediatric): Secondary | ICD-10-CM | POA: Diagnosis present

## 2023-12-21 DIAGNOSIS — E1122 Type 2 diabetes mellitus with diabetic chronic kidney disease: Secondary | ICD-10-CM | POA: Diagnosis present

## 2023-12-21 DIAGNOSIS — Z006 Encounter for examination for normal comparison and control in clinical research program: Secondary | ICD-10-CM

## 2023-12-21 DIAGNOSIS — I272 Pulmonary hypertension, unspecified: Secondary | ICD-10-CM | POA: Diagnosis present

## 2023-12-21 DIAGNOSIS — Z6832 Body mass index (BMI) 32.0-32.9, adult: Secondary | ICD-10-CM | POA: Diagnosis not present

## 2023-12-21 DIAGNOSIS — C61 Malignant neoplasm of prostate: Secondary | ICD-10-CM | POA: Diagnosis present

## 2023-12-21 DIAGNOSIS — F03A0 Unspecified dementia, mild, without behavioral disturbance, psychotic disturbance, mood disturbance, and anxiety: Secondary | ICD-10-CM | POA: Diagnosis present

## 2023-12-21 DIAGNOSIS — I428 Other cardiomyopathies: Secondary | ICD-10-CM | POA: Diagnosis present

## 2023-12-21 DIAGNOSIS — I10 Essential (primary) hypertension: Secondary | ICD-10-CM | POA: Diagnosis present

## 2023-12-21 DIAGNOSIS — I5042 Chronic combined systolic (congestive) and diastolic (congestive) heart failure: Secondary | ICD-10-CM | POA: Diagnosis present

## 2023-12-21 DIAGNOSIS — Z8673 Personal history of transient ischemic attack (TIA), and cerebral infarction without residual deficits: Secondary | ICD-10-CM

## 2023-12-21 DIAGNOSIS — I2510 Atherosclerotic heart disease of native coronary artery without angina pectoris: Secondary | ICD-10-CM

## 2023-12-21 DIAGNOSIS — Z7901 Long term (current) use of anticoagulants: Secondary | ICD-10-CM

## 2023-12-21 DIAGNOSIS — I4819 Other persistent atrial fibrillation: Secondary | ICD-10-CM | POA: Diagnosis present

## 2023-12-21 DIAGNOSIS — Z886 Allergy status to analgesic agent status: Secondary | ICD-10-CM | POA: Diagnosis not present

## 2023-12-21 DIAGNOSIS — N1831 Chronic kidney disease, stage 3a: Secondary | ICD-10-CM | POA: Diagnosis present

## 2023-12-21 DIAGNOSIS — I511 Rupture of chordae tendineae, not elsewhere classified: Secondary | ICD-10-CM | POA: Diagnosis present

## 2023-12-21 DIAGNOSIS — I130 Hypertensive heart and chronic kidney disease with heart failure and stage 1 through stage 4 chronic kidney disease, or unspecified chronic kidney disease: Secondary | ICD-10-CM | POA: Diagnosis present

## 2023-12-21 DIAGNOSIS — I110 Hypertensive heart disease with heart failure: Secondary | ICD-10-CM | POA: Diagnosis not present

## 2023-12-21 DIAGNOSIS — Z954 Presence of other heart-valve replacement: Secondary | ICD-10-CM | POA: Diagnosis not present

## 2023-12-21 DIAGNOSIS — I5032 Chronic diastolic (congestive) heart failure: Secondary | ICD-10-CM | POA: Diagnosis present

## 2023-12-21 DIAGNOSIS — Z8546 Personal history of malignant neoplasm of prostate: Secondary | ICD-10-CM | POA: Diagnosis not present

## 2023-12-21 DIAGNOSIS — Z9889 Other specified postprocedural states: Principal | ICD-10-CM

## 2023-12-21 DIAGNOSIS — Z95818 Presence of other cardiac implants and grafts: Principal | ICD-10-CM

## 2023-12-21 DIAGNOSIS — E119 Type 2 diabetes mellitus without complications: Secondary | ICD-10-CM

## 2023-12-21 HISTORY — PX: TRANSESOPHAGEAL ECHOCARDIOGRAM (CATH LAB): EP1270

## 2023-12-21 HISTORY — DX: Atherosclerotic heart disease of native coronary artery without angina pectoris: I25.10

## 2023-12-21 HISTORY — PX: TRANSCATHETER MITRAL EDGE TO EDGE REPAIR: CATH118311

## 2023-12-21 LAB — POCT ACTIVATED CLOTTING TIME
Activated Clotting Time: 314 s
Activated Clotting Time: 262 s
Activated Clotting Time: 279 s
Activated Clotting Time: 135 s

## 2023-12-21 LAB — ECHO TEE
MV M vel: 4.92 m/s
MV Peak grad: 96.8 mmHg
Radius: 1.1 cm

## 2023-12-21 LAB — BPAM RBC
Unit Type and Rh: 600
Unit Type and Rh: 600

## 2023-12-21 LAB — POCT I-STAT 7, (LYTES, BLD GAS, ICA,H+H)
Acid-base deficit: 3 mmol/L — ABNORMAL HIGH (ref 0.0–2.0)
Bicarbonate: 24.9 mmol/L (ref 20.0–28.0)
Calcium, Ion: 1.17 mmol/L (ref 1.15–1.40)
HCT: 41 % (ref 39.0–52.0)
Hemoglobin: 13.9 g/dL (ref 13.0–17.0)
O2 Saturation: 98 %
Potassium: 3.6 mmol/L (ref 3.5–5.1)
Sodium: 140 mmol/L (ref 135–145)
TCO2: 26 mmol/L (ref 22–32)
pCO2 arterial: 53.4 mmHg — ABNORMAL HIGH (ref 32–48)
pH, Arterial: 7.276 — ABNORMAL LOW (ref 7.35–7.45)
pO2, Arterial: 115 mmHg — ABNORMAL HIGH (ref 83–108)

## 2023-12-21 LAB — TYPE AND SCREEN
Unit division: 0
Unit division: 0

## 2023-12-21 LAB — GLUCOSE, CAPILLARY: Glucose-Capillary: 158 mg/dL — ABNORMAL HIGH (ref 70–99)

## 2023-12-21 LAB — SURGICAL PCR SCREEN
MRSA, PCR: NEGATIVE
Staphylococcus aureus: NEGATIVE

## 2023-12-21 SURGERY — MITRAL VALVE REPAIR
Anesthesia: General

## 2023-12-21 MED ORDER — PROPOFOL 10 MG/ML IV BOLUS
INTRAVENOUS | Status: DC | PRN
  Administered 2023-12-21: 140 mg via INTRAVENOUS

## 2023-12-21 MED ORDER — ONDANSETRON HCL 4 MG/2ML IJ SOLN
INTRAMUSCULAR | Status: DC | PRN
  Administered 2023-12-21: 4 mg via INTRAVENOUS

## 2023-12-21 MED ORDER — FUROSEMIDE 20 MG PO TABS
20.0000 mg | ORAL_TABLET | Freq: Every day | ORAL | Status: DC
  Administered 2023-12-22: 20 mg via ORAL
  Filled 2023-12-21: qty 1

## 2023-12-21 MED ORDER — SODIUM CHLORIDE 0.9% FLUSH
3.0000 mL | Freq: Two times a day (BID) | INTRAVENOUS | Status: DC
  Administered 2023-12-21 – 2023-12-22 (×2): 3 mL via INTRAVENOUS

## 2023-12-21 MED ORDER — CHLORHEXIDINE GLUCONATE 4 % EX SOLN: 60.0000 mL | Freq: Once | CUTANEOUS | Status: DC

## 2023-12-21 MED ORDER — FENTANYL CITRATE (PF) 250 MCG/5ML IJ SOLN
INTRAMUSCULAR | Status: DC | PRN
  Administered 2023-12-21: 100 ug via INTRAVENOUS

## 2023-12-21 MED ORDER — CLEVIDIPINE BUTYRATE 0.5 MG/ML IV EMUL
INTRAVENOUS | Status: DC | PRN
Start: 2023-12-21 — End: 2023-12-21
  Administered 2023-12-21: 2 mg/h via INTRAVENOUS

## 2023-12-21 MED ORDER — CARVEDILOL 3.125 MG PO TABS
3.1250 mg | ORAL_TABLET | Freq: Once | ORAL | Status: AC
  Administered 2023-12-21: 3.125 mg via ORAL
  Filled 2023-12-21: qty 1

## 2023-12-21 MED ORDER — HEPARIN SODIUM (PORCINE) 1000 UNIT/ML IJ SOLN
INTRAMUSCULAR | Status: AC
  Filled 2023-12-21: qty 10

## 2023-12-21 MED ORDER — ACETAMINOPHEN 325 MG PO TABS: 650.0000 mg | ORAL_TABLET | ORAL | Status: DC | PRN

## 2023-12-21 MED ORDER — SODIUM CHLORIDE 0.9 % IV SOLN: INTRAVENOUS | Status: DC

## 2023-12-21 MED ORDER — HEPARIN SODIUM (PORCINE) 1000 UNIT/ML IJ SOLN
INTRAMUSCULAR | Status: DC | PRN
  Administered 2023-12-21: 3000 [IU] via INTRAVENOUS
  Administered 2023-12-21: 15000 [IU] via INTRAVENOUS

## 2023-12-21 MED ORDER — CARVEDILOL 3.125 MG PO TABS
3.1250 mg | ORAL_TABLET | Freq: Two times a day (BID) | ORAL | Status: DC
  Administered 2023-12-22: 3.125 mg via ORAL
  Filled 2023-12-21: qty 1

## 2023-12-21 MED ORDER — NOREPINEPHRINE 4 MG/250ML-% IV SOLN
INTRAVENOUS | Status: DC | PRN
Start: 2023-12-21 — End: 2023-12-21
  Administered 2023-12-21: 4 ug/min via INTRAVENOUS

## 2023-12-21 MED ORDER — SACUBITRIL-VALSARTAN 24-26 MG PO TABS
1.0000 | ORAL_TABLET | Freq: Two times a day (BID) | ORAL | Status: DC
  Administered 2023-12-22: 1 via ORAL
  Filled 2023-12-21: qty 1

## 2023-12-21 MED ORDER — ROCURONIUM BROMIDE 10 MG/ML (PF) SYRINGE
PREFILLED_SYRINGE | INTRAVENOUS | Status: DC | PRN
  Administered 2023-12-21: 80 mg via INTRAVENOUS
  Administered 2023-12-21: 10 mg via INTRAVENOUS

## 2023-12-21 MED ORDER — NOREPINEPHRINE BITARTRATE 1 MG/ML IV SOLN
INTRAVENOUS | Status: DC | PRN
  Administered 2023-12-21 (×2): 1 mL via INTRAVENOUS

## 2023-12-21 MED ORDER — HEPARIN (PORCINE) IN NACL 2000-0.9 UNIT/L-% IV SOLN
INTRAVENOUS | Status: DC | PRN
  Administered 2023-12-21 (×3): 1000 mL

## 2023-12-21 MED ORDER — SUGAMMADEX SODIUM 200 MG/2ML IV SOLN
INTRAVENOUS | Status: DC | PRN
  Administered 2023-12-21: 400 mg via INTRAVENOUS

## 2023-12-21 MED ORDER — ONDANSETRON HCL 4 MG/2ML IJ SOLN: 4.0000 mg | Freq: Four times a day (QID) | INTRAMUSCULAR | Status: DC | PRN

## 2023-12-21 MED ORDER — PROTAMINE SULFATE 10 MG/ML IV SOLN
INTRAVENOUS | Status: DC | PRN
  Administered 2023-12-21: 50 mg via INTRAVENOUS

## 2023-12-21 MED ORDER — HEPARIN (PORCINE) IN NACL 1000-0.9 UT/500ML-% IV SOLN
INTRAVENOUS | Status: DC | PRN
  Administered 2023-12-21: 500 mL

## 2023-12-21 MED ORDER — ALBUMIN HUMAN 5 % IV SOLN: INTRAVENOUS | Status: DC | PRN

## 2023-12-21 MED ORDER — SODIUM CHLORIDE 0.9 % IV SOLN: 250.0000 mL | INTRAVENOUS | Status: DC | PRN

## 2023-12-21 MED ORDER — SODIUM CHLORIDE 0.9% FLUSH: 3.0000 mL | INTRAVENOUS | Status: DC | PRN

## 2023-12-21 MED ORDER — CHLORHEXIDINE GLUCONATE 0.12 % MT SOLN
15.0000 mL | Freq: Once | OROMUCOSAL | Status: AC
  Administered 2023-12-21: 15 mL via OROMUCOSAL
  Filled 2023-12-21: qty 15

## 2023-12-21 MED ORDER — CEFAZOLIN SODIUM-DEXTROSE 2-4 GM/100ML-% IV SOLN
2.0000 g | INTRAVENOUS | Status: AC
  Administered 2023-12-21: 2 g via INTRAVENOUS
  Filled 2023-12-21: qty 100

## 2023-12-21 MED ORDER — CHLORHEXIDINE GLUCONATE 0.12 % MT SOLN: 15.0000 mL | Freq: Once | OROMUCOSAL | Status: DC

## 2023-12-21 MED ORDER — LACTATED RINGERS IV SOLN: INTRAVENOUS | Status: DC

## 2023-12-21 MED ORDER — FENTANYL CITRATE (PF) 100 MCG/2ML IJ SOLN
INTRAMUSCULAR | Status: AC
Start: 2023-12-21 — End: 2023-12-21
  Filled 2023-12-21: qty 2

## 2023-12-21 SURGICAL SUPPLY — 15 items
CATH MITRA STEERABLE GUIDE (CATHETERS) IMPLANT
CLIP MITRA G4 DELIVERY SYS NTW (Clip) IMPLANT
CLOSURE PERCLOSE PROSTYLE (VASCULAR PRODUCTS) IMPLANT
KIT CATH VERSACROSS STEERABLE (CATHETERS) IMPLANT
KIT HEART LEFT (KITS) ×2 IMPLANT
KIT MICROPUNCTURE NIT STIFF (SHEATH) IMPLANT
PACK CARDIAC CATHETERIZATION (CUSTOM PROCEDURE TRAY) ×1 IMPLANT
PAD SORBX EP SHIELD 16.5X12 (MISCELLANEOUS) IMPLANT
SHEATH DILAT COONS TAPER 22F (SHEATH) IMPLANT
SHEATH PINNACLE 8F 10CM (SHEATH) IMPLANT
SHEATH PROBE COVER 6X72 (BAG) ×1 IMPLANT
STOPCOCK MORSE 400PSI 3WAY (MISCELLANEOUS) ×6 IMPLANT
SYSTEM MITRACLIP G4 (SYSTAGENIX WOUND MANAGEMENT) IMPLANT
TRANSDUCER W/STOPCOCK (MISCELLANEOUS) ×1 IMPLANT
TUBING ART PRESS 72 MALE/FEM (TUBING) ×1 IMPLANT

## 2023-12-21 NOTE — Progress Notes (Signed)
 Patient answers to 'yes and 'no' questions at times. Obeys commands X 4. On arrival to recovery, patient was trying to bend right leg and attempted to sit up. Has settled down now.

## 2023-12-21 NOTE — Anesthesia Procedure Notes (Signed)
 Procedure Name: Intubation Date/Time: 12/21/2023 3:29 PM  Performed by: Bennett Brass, CRNAPre-anesthesia Checklist: Patient identified, Emergency Drugs available, Suction available and Patient being monitored Patient Re-evaluated:Patient Re-evaluated prior to induction Oxygen Delivery Method: Circle system utilized Preoxygenation: Pre-oxygenation with 100% oxygen Induction Type: IV induction Ventilation: Mask ventilation without difficulty Laryngoscope Size: Miller and 2 Grade View: Grade I Tube type: Oral Tube size: 7.5 mm Number of attempts: 1 Airway Equipment and Method: Stylet and Oral airway Placement Confirmation: ETT inserted through vocal cords under direct vision, positive ETCO2 and breath sounds checked- equal and bilateral Secured at: 23 cm Tube secured with: Tape Dental Injury: Teeth and Oropharynx as per pre-operative assessment

## 2023-12-21 NOTE — Transfer of Care (Signed)
 Immediate Anesthesia Transfer of Care Note  Patient: [name]  Procedure(s) Performed: TRANSCATHETER MITRAL EDGE TO EDGE REPAIR TRANSESOPHAGEAL ECHOCARDIOGRAM  Patient Location: PACU  Anesthesia Type:General  Level of Consciousness: awake and alert   Airway & Oxygen Therapy: Patient Spontanous Breathing and Patient connected to face mask oxygen  Post-op Assessment: Report given to RN, Post -op Vital signs reviewed and stable, Patient moving all extremities, and Patient moving all extremities X 4  Post vital signs: Reviewed and stable  Last Vitals:  Vitals Value Taken Time  BP 144/78 12/21/23 18:09  Temp    Pulse 80 12/21/23 18:15  Resp 19 12/21/23 18:15  SpO2 93 % 12/21/23 18:15  Vitals shown include unfiled device data.  Last Pain:  Vitals:   12/21/23 0933  TempSrc:   PainSc: 0-No pain         Complications: No notable events documented.

## 2023-12-21 NOTE — Op Note (Signed)
 [facility]   MULTIDISCIPLINARY HEART TEAM  Date of Procedure:  12/21/2023  Preoperative Diagnosis: Severe Symptomatic Mitral Regurgitation (Stage D)  Postoperative Diagnosis: Same   Procedure Performed: Ultrasound-guided right transfemoral venous access Double PreClose right femoral vein Transseptal puncture using Bailess RF needle Mitral valve repair with MitraClip NTW x 2  Surgeon: Arlander Bellman, MD   Echocardiographer: Janelle Mediate, MD  Anesthesiologist: Denny Flack, MD  Device Implant: Mitraclip NTW x 2  Procedural Indication: Severe Non-rheumatic Mitral Regurgitation (Stage D)   Brief History: 89 year old gentleman with severe 3-4+ primary mitral regurgitation with a flail segment of P1 and prolapse of P2.  He has undergone surgical evaluation and is felt to be a poor candidate for conventional mitral valve surgery due to his advanced age of 89 years old and poor functional capacity.  After review of treatment options he is referred for transcatheter edge-to-edge repair of the mitral valve.  Echo Findings: Preop:  Normal LV systolic function Severe MR secondary to flail P1, Grade 3-4+ with blunting of pulmonary venous flow Post-op:  Unchanged LV systolic function 1+ residual MR Antegrade systolic dominant pulmonary venous flow  Procedural Details: Prep The patient is brought to the cardiac catheterization lab in the fasting state. General anesthesia is induced. The patient is prepped from the groin to chin.  Venous Access Using ultrasound guidance, the right femoral vein is punctured. Ultrasound images are captured and stored in the patient's chart. The vein is dilated and 2 Perclose devices are deplyed at 10' and 2' positions to 'Preclose' the femoral vein. An 8 Fr sheath is inserted.  Transseptal Puncture A Baylis Versacross wire is advanced into the SVC A Baylis transseptal dilator is advanced into the SVC, and the VersaCross RF wire is  retracted into the dilator  The transseptal sheath is retracted into the RA under fluoroscopic and echo guidance to obtain position on the posterior fossa where echo measurements are made to assure appropriate access to the mitral valve. Once proper position is confirmed by echo, RF energy is delivered and the VersaCross wire is advanced into the LA without resistance. The dilator and sheath are advanced over the wire where proper position is confirmed by echo and pressure measurement Weight based IV heparin  is administered and a therapeutic ACT > 250 is confirmed  Steerable Guide Catheter Insertion The VersaCross wire is positioned at the left upper pulmonary vein The femoral vein is progressively dilated and the 24 Fr Steerable guide catheter is inserted and then directed across the interatrial septum over the wire. Position is confirmed approximately 3 cm into the left atrium The guide is de-aired   MitraClip Insertion The MitraClip NTW is prepped per protocol and inserted via the introducer into the steerable guide catheter The Clip Delivery System (CDS) is advanced under fluoro and echo guidance so that the sleeve markers are evenly spaced on each side of the guide marker  MitraClip Positioning in the Left Atrium (Supravalvular Alignment) M-knob is applied to bring the Clip towards the mitral valve. Echo guidance is used to avoid contact with LA structures. The Clip arms are opened to 180 degrees 2D and 3D TEE imaging is performed in multiple planes and the Clip is positioned and aligned above the valve using standard steering techniques.  The clip is positioned just above the flail segment over the lateral commissure.  Entry into the Left Ventricle and Mitral Valve Leaflet Grasp The Clip is advanced across the mitral valve into the LV, maintaining proper  orientation The Clip arms are opened to 120 degrees and the Clip is slowly retracted  Capture of both the anterior and posterior leaflets  are visualized by echo and the grippers are dropped  MitraClip Deployment After extensive echo evaluation, reduction in mitral regurgitation is felt to be adequate Following standard protocol, the lock line is removed after testing the lock mechanism. The lock is rechecked and is shown to be intact. The MitraClip device is deployed and the clip delivery system is removed under echo guidance with caution taken to avoid contact with LA structures  Placement of Clip #2 After echo evaluation and release of the first clip, there is at least 2+ residual mitral regurgitation just on the medial side of the first clip.  We elected to proceed with insertion of a second MitraClip device. MitraClip Insertion The MitraClip NTW is prepped per protocol and inserted via the introducer into the steerable guide catheter The Clip Delivery System (CDS) is advanced under fluoro and echo guidance so that the sleeve markers are evenly spaced on each side of the guide marker  MitraClip Positioning in the Left Atrium (Supravalvular Alignment) M-knob is applied to bring the Clip towards the mitral valve. Echo guidance is used to avoid contact with LA structures. Low tidal volume respiration is initiated The Clip arms are opened to 180 degrees 2D and 3D TEE imaging is performed in multiple planes and the Clip is positioned and aligned above the valve using standard steering techniques.  The clip is positioned just medial/central to the first clip.  Entry into the Left Ventricle and Mitral Valve Leaflet Grasp The Clip is advanced across the mitral valve into the LV, maintaining proper orientation and with caution taken to avoid contact with the first Clip The Clip arms are opened further and the Clip is slowly retracted  Capture of both the anterior and posterior leaflets are visualized by echo and the grippers are dropped  MitraClip Deployment After extensive echo evaluation, reduction in mitral regurgitation is felt  to be adequate Following standard protocol, the MitraClip device is deployed, gripper line and lock line are removed  Device Removal The clip delivery system is removed under echo guidance The steerable guide catheter is retracted into the right atrium and the interatrial septum is assessed by echo without evidence of right-to-left shunting or large ASD  Hemostasis The guide catheter is removed over a 0.035 wire and the Perclose sutures are tightened with complete hemostasis and no evidence of hematoma  Estimated blood loss: minimal  There are no immediate procedural complications. The patient is transferred to the post-procedure recovery area in stable condition.   Conclusion: Successful transcatheter edge-to-edge repair of the mitral valve with deployment of 2 MitraClip NTW devices.  The first device was deployed A1/P1 and the second device is deployed A2/P2.  Baseline mitral regurgitation 3-4+, postprocedural mitral regurgitation 1+.  [name] 12/21/2023 7:06 PM

## 2023-12-21 NOTE — Discharge Summary (Addendum)
 HEART AND VASCULAR CENTER   MULTIDISCIPLINARY HEART VALVE TEAM  Discharge Summary    Patient ID: [name] MRN: [id number]; DOB: 1934-07-13  Admit date: 12/21/2023 Discharge date: 12/22/2023  PCP:  Onita Rush, MD  [facility] Cardiologist:  Ozell Fell, MD  [facility] Structural heart: Ozell Fell, MD [facility] Electrophysiologist:  Lynwood Rakers, MD (Inactive)   Discharge Diagnoses    Principal Problem:   S/P mitral valve clip implantation Active Problems:   Hx of Prostate cancer   Stage 3a chronic kidney disease (HCC)   RBBB   HLD (hyperlipidemia)   Diabetes mellitus without complication (HCC)   Essential hypertension   Atrial fibrillation (HCC)   Heart failure with improved ejection fraction (HFimpEF) (HCC)   Severe mitral insufficiency   Overweight   Allergies Allergies  Allergen Reactions   Aspirin  Other (See Comments)    History of stomach ulcers, was told to not take this     Diagnostic Studies/Procedures    HEART AND VASCULAR CENTER   MULTIDISCIPLINARY HEART TEAM   Date of Procedure:                12/21/2023   Preoperative Diagnosis:Severe Symptomatic Mitral Regurgitation (Stage D)   Postoperative Diagnosis:    Same    Procedure Performed: Ultrasound-guided right transfemoral venous access Double PreClose right femoral vein Transseptal puncture using Bailess RF needle Mitral valve repair with MitraClip NTW x 2   Surgeon: Ozell JONETTA Fell, MD    Echocardiographer: Maude Emmer, MD   Anesthesiologist: Cathlyn Fix, MD   Device Implant: Mitraclip NTW x 2   Procedural Indication: Severe Non-rheumatic Mitral Regurgitation (Stage D)    Brief History: 89 year old gentleman with severe 3-4+ primary mitral regurgitation with a flail segment of P1 and prolapse of P2.  He has undergone surgical evaluation and is felt to be a poor candidate for conventional mitral valve surgery due to his advanced age of 89 years old and poor  functional capacity.  After review of treatment options he is referred for transcatheter edge-to-edge repair of the mitral valve.   Echo Findings: Preop:  Normal LV systolic function Severe MR secondary to flail P1, Grade 3-4+ with blunting of pulmonary venous flow Post-op:  Unchanged LV systolic function 1+ residual MR Antegrade systolic dominant pulmonary venous flow _____________    Echo 12/22/23: completed but pending formal read at the time of discharge   History of Present Illness     [name] is a 89 y.o. 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  He was admitted 2/24-08/11/23 with acute on chronic heart failure after discontinuation of Entresto  and presenting with hypertensive urgency. Echo at that time showed EF 60% with mod-severe MR. 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 [facility] 11/14/23 showed patent coronary arteries with moderate three-vessel disease, diffuse calcification, and no focal high-grade stenoses. Wedge pressure was 20 mmHg with a V wave of 27 mmHg and mean PA pressure was 24 mmHg with preserved cardiac output of 4.9 L/min.  He was seen by Dr. Shyrl who deemed him a nonoperative candidate due to age, reduced functional capacity, and presence of mild dementia.   The patient was evaluated by the multidisciplinary valve team and felt to have severe, symptomatic mitral regurgitation  and to be a suitable candidate for mTEER, which was  set up for 12/21/23.  Hospital Course     Consultants: none   Severe MR: -- S/p mTEER with Mitraclip NTW x 2 with a reduction from 3-4+ MR to 1 + residual MR.  -- Post op echo completed but pending formal read.  -- Resume home Eliquis  5mg  BID.  -- I will see back next week for close post hospital follow up.      PAF: -- Maintaining sinus.  -- Continue Eliquis  5mg  BID.    HTN: -- BP well controlled.  -- Continue Entresto  24-26 mg BID.  -- Continue Lasix  20mg  daily. -- Continue Coreg  3.125mg  BID.   Chronic HFimpEF: -- EF 55-60% on intra op TEE  12/21/23. -- Appears euvolemic. -- Continue Entresto  24-26 mg BID.  -- Continue Lasix  20mg  daily. -- Continue Coreg  3.125mg  BID.   CAD: -- Cath 11/14/23 showed patent coronary arteries with moderate three-vessel disease, diffuse calcification, and no focal high-grade stenoses -- No angina. -- No antiplatelet given need for OAC.  -- Statin intolerant.  -- Continue Zetia  10mg  daily.    _____________  Discharge Vitals Blood pressure 109/70, pulse 88, temperature 98 F (36.7 C), temperature source Axillary, resp. rate 20, height 6' (1.829 m), weight 104.5 kg, SpO2 94%.  Filed Weights   12/21/23 0848 12/22/23 0500  Weight: 108.5 kg 104.5 kg     GEN: Well nourished, well developed in no acute distress NECK: No JVD CARDIAC: RRR, 1/6 holosystolic murmur at apex. No rubs, gallops RESPIRATORY:  Clear to auscultation without rales, wheezing or rhonchi  ABDOMEN: Soft, non-tender, non-distended EXTREMITIES:  No edema; No deformity.  Groin site clear without hematoma or ecchymosis.Mild red blood on bandage.    Disposition   Pt is being discharged home today in good condition.  Follow-up Plans & Appointments     Follow-up Information     Sebastian Lamarr SAUNDERS, PA-C. Go on 12/29/2023.   Specialties: Cardiology, Radiology Why: @ 1:30pm, please arrive 15 minutes early. Contact information: [address] [place]  [address] [phone]                Discharge Instructions     Amb Referral to Cardiac Rehabilitation   Complete by: As directed    Diagnosis: Valve Repair   Valve: Mitral   After initial evaluation and assessments completed: Virtual Based Care may be provided alone or in conjunction with Phase 2 Cardiac Rehab based on patient barriers.: Yes   Intensive Cardiac Rehabilitation (ICR) [facility] location only OR Traditional Cardiac Rehabilitation (TCR) *If criteria for ICR are not met will enroll in TCR [facility] only): Yes       Discharge Medications   Allergies as of 12/22/2023       Reactions   Aspirin  Other (See Comments)   History of stomach ulcers, was told to not take this         Medication List     TAKE these medications    apixaban  5 MG Tabs tablet Commonly known as: Eliquis  Take 1 tablet (5 mg total) by mouth 2 (two) times daily.   ascorbic acid 500 MG tablet Commonly known as: VITAMIN C Take 500 mg by mouth daily.  B-12 2500 MCG Tabs Take 2,500 mcg by mouth daily.   carvedilol  3.125 MG tablet Commonly known as: COREG  Take 1 tablet (3.125 mg total) by mouth 2 (two) times daily with a meal.   furosemide  20 MG tablet Commonly known as: LASIX  Take 1 tablet (20 mg total) by mouth daily.   LUBRICANT EYE DROPS OP Place 1 drop into both eyes daily as needed (dry eyes).   sacubitril -valsartan  24-26 MG Commonly known as: ENTRESTO  Take 1 tablet by mouth 2 (two) times daily.   Vitamin D 50 MCG (2000 UT) tablet Take 2,000 Units by mouth daily.         Outstanding Labs/Studies   none ______________________  Duration of Discharge Encounter: APP Time: 15 minutes    Signed, Lamarr Hummer, PA-C 12/22/2023, 9:53 AM [phone]  Patient seen, examined. Available data reviewed. Agree with findings, assessment, and plan as outlined by Izetta Hummer, PA-C.  Patient is independently interviewed and examined.  He is alert, oriented, no distress.   Heart is irregularly irregular with a 1/6 holosystolic murmur at the apex, lungs clear bilaterally, JVP normal, abdomen soft and nontender with no organomegaly, right groin site clear with no hematoma or ecchymosis, no lower extremity edema.  The patient's echocardiogram is personally reviewed and shows mild LV dysfunction and dyssynergy with LVEF estimated at 45 to 50%, normal transmitral gradient of 3.5 mmHg, and only mild 1+ residual mitral regurgitation status post transcatheter edge-to-edge repair of the mitral valve.  The patient is being started back on apixaban  for anticoagulation in the setting of paroxysmal atrial fibrillation.  He is otherwise stable from a cardiac perspective.  MD time spent conducting this evaluation is 20 minutes and includes my personal exam of the patient, personal review of his echo images, review of all medications, and discussion about postprocedural care and discharge planning.  Ozell Fell, M.D. 12/23/2023 12:42 PM

## 2023-12-21 NOTE — Interval H&P Note (Signed)
 History and Physical Interval Note:  12/21/2023 3:04 PM  [name]  has presented today for surgery, with the diagnosis of severe mitral insufficiency.  The various methods of treatment have been discussed with the patient and family. After consideration of risks, benefits and other options for treatment, the patient has consented to  Procedure(s): TRANSCATHETER MITRAL EDGE TO EDGE REPAIR (N/A) TRANSESOPHAGEAL ECHOCARDIOGRAM (N/A) as a surgical intervention.  The patient's history has been reviewed, patient examined, no change in status, stable for surgery.  I have reviewed the patient's chart and labs.  Questions were answered to the patient's satisfaction.     Arnoldo Lapping

## 2023-12-21 NOTE — Anesthesia Procedure Notes (Signed)
 Arterial Line Insertion Start/End6/18/2025 9:50 AM, 12/21/2023 9:58 AM Performed by: Robert Chimes, CRNA, CRNA  Patient location: Pre-op. Preanesthetic checklist: patient identified, IV checked, site marked, risks and benefits discussed, surgical consent, monitors and equipment checked, pre-op evaluation, timeout performed and anesthesia consent Lidocaine  1% used for infiltration Left, radial was placed Catheter size: 20 G Hand hygiene performed  and maximum sterile barriers used   Attempts: 2 Procedure performed using ultrasound guided technique. Ultrasound Notes:anatomy identified, needle tip was noted to be adjacent to the nerve/plexus identified and no ultrasound evidence of intravascular and/or intraneural injection Following insertion, dressing applied and Biopatch. Post procedure assessment: normal and unchanged  Post procedure complications: unsuccessful attempts. Patient tolerated the procedure well with no immediate complications.

## 2023-12-21 NOTE — Discharge Instructions (Signed)
 Home Care Following Your Mitral Valve Clip Procedure      If you have any questions or concerns you can call the structural heart office at [phone] during normal business hours 8am-4pm. If you have an urgent need after hours or on the weekend, please call [phone] to talk to the on call provider for general cardiology. If you have an emergency that requires immediate attention, please call 911.   Groin Site Care Refer to this sheet in the next few weeks. These instructions provide you with information on caring for yourself after your procedure. Your caregiver may also give you more specific instructions. Your treatment has been planned according to current medical practices, but problems sometimes occur. Call your caregiver if you have any problems or questions after your procedure. HOME CARE INSTRUCTIONS You may shower 24 hours after the procedure. Remove the bandage (dressing) and gently wash the site with plain soap and water. Gently pat the site dry.  Do not apply powder or lotion to the site.  Do not sit in a bathtub, swimming pool, or whirlpool for 5 to 7 days.  No bending, squatting, or lifting anything over 10 pounds (4.5 kg) as directed by your caregiver.  Inspect the site at least twice daily.  Do not drive home if you are discharged the same day of the procedure. Have someone else drive you.  You may drive 72 hours after the procedure unless otherwise instructed by your caregiver.  What to expect: Any bruising will usually fade within 1 to 2 weeks.  Blood that collects in the tissue (hematoma) may be painful to the touch. It should usually decrease in size and tenderness within 1 to 2 weeks.  SEEK IMMEDIATE MEDICAL CARE IF: You have unusual pain at the groin site or down the affected leg.  You have redness, warmth, swelling, or pain at the groin site.  You have drainage (other than a small amount of blood on the dressing).  You have chills.  You have a fever or persistent  symptoms for more than 72 hours.  You have a fever and your symptoms suddenly get worse.  Your leg becomes pale, cool, tingly, or numb.  You have bleeding from the site. Hold pressure on the site until it subsides.    After MitraClip Checklist  Check  Test Description   Follow up appointment in 1-2 weeks  Most of our patients will see our structural heart APP or your primary cardiologist within 1-2 weeks. Your incision site will be checked and you will be cleared to resume all normal activities if you are doing well.     1 month echo and follow up  You will have an echo to check on your heart valve clip and be seen back in the office by our structural heart APP   Follow up with your primary cardiologist You will need to be seen by your primary cardiologist in the following 3-6 months after your 1 month appointment in the valve clinic. Often times your blood thinners will be changed. This is decided on a case by case basis.    1 year echo and follow up You will have another echo to check on your heart valve after one year and be seen back in the office by our structural heart APP. This your last structural heart visit.   Bacterial endocarditis prophylaxis  You will have to take antibiotics for the rest of your life before all dental procedures (even dental cleanings) to protect your  heart valve from potential infection. Antibiotics are also required before some surgeries. Please check with your cardiologist before scheduling any surgeries. Also, please make sure to tell us if you have a penicillin allergy as you will require an alternative antibiotic.    ______________  Your Implant Identification Card Following your procedure, you will receive an Implant Identification Card, which your doctor will fill out and which you must carry with you at all times. Show your Implant Identification Card if you report to an emergency room. This card identifies you as a patient who has had a device implanted. If  you require a magnetic resonance imaging (MRI) scan, tell your doctor or MRI technician that you have a clip device implanted. Test results indicate that patients with the mitral valve clip can safely undergo MRI scans under certain conditions described on the card.

## 2023-12-21 NOTE — Anesthesia Preprocedure Evaluation (Addendum)
 Anesthesia Evaluation  Patient identified by MRN, date of birth, ID band Patient awake    Reviewed: Allergy & Precautions, NPO status , Patient's Chart, lab work & pertinent test results  Airway Mallampati: II  TM Distance: >3 FB Neck ROM: Full    Dental no notable dental hx. (+) Poor Dentition, Missing   Pulmonary sleep apnea , former smoker   Pulmonary exam normal        Cardiovascular hypertension, Pt. on medications and Pt. on home beta blockers + CAD and +CHF  + dysrhythmias Atrial Fibrillation  Rhythm:Regular Rate:Normal + Systolic murmurs   1. There is substantial left ventricular contractile dyssynchrony, even  though the ECG does not show a typical LBBB. Left ventricular ejection  fraction, by estimation, is 45 to 50%. The left ventricle has mildly  decreased function. The left ventricle  demonstrates global hypokinesis. The left ventricular internal cavity size  was mildly to moderately dilated.   2. Right ventricular systolic function is normal. The right ventricular  size is normal. There is moderately elevated pulmonary artery systolic  pressure. The estimated right ventricular systolic pressure is 59.8 mmHg.   3. Left atrial size was severely dilated. No left atrial/left atrial  appendage thrombus was detected.   4. There is a ruptured chorda tendina to the posterior mitral leaflet  with flail motion of the lateral (P1) scallop and highly eccentric mitral  insufficiency. By the PISA method the effective regurgitant orifice area  is 0.31 cm sq, regurgitant volume 50   mL, regurgitant fraction 45%. Suspect these values underestimate the true  severity of the mitral insufficiency. The mitral valve area is very large  (>10 cm sq) and the mean gradient is <2 mm Hg at a heart rate of 77 bpm.  The posterior leaflet length is  14 cm. The mitral valve is abnormal. Moderate to severe mitral valve  regurgitation. No  evidence of mitral stenosis.   5. The aortic valve is tricuspid. There is mild thickening of the aortic  valve. Aortic valve regurgitation is mild. Aortic valve sclerosis is  present, with no evidence of aortic valve stenosis.   6. There is mild (Grade II) plaque involving the descending aorta.   7. Evidence of atrial level shunting detected by color flow Doppler.  There is a small patent foramen ovale with predominantly left to right  shunting across the atrial septum.   8. 3D performed of the mitral valve.     Neuro/Psych  Headaches CVA  negative psych ROS   GI/Hepatic Neg liver ROS,GERD  ,,  Endo/Other  diabetes    Renal/GU   negative genitourinary   Musculoskeletal negative musculoskeletal ROS (+)    Abdominal Normal abdominal exam  (+)   Peds  Hematology Lab Results      Component                Value               Date                      WBC                      5.8                 12/13/2023                HGB  14.9                12/13/2023                HCT                      47.6                12/13/2023                MCV                      93                  12/13/2023                PLT                      164                 12/13/2023              Anesthesia Other Findings   Reproductive/Obstetrics                             Anesthesia Physical Anesthesia Plan  ASA: 3  Anesthesia Plan: General   Post-op Pain Management:    Induction: Intravenous  PONV Risk Score and Plan: 2 and Ondansetron , Dexamethasone  and Treatment may vary due to age or medical condition  Airway Management Planned: Mask and Oral ETT  Additional Equipment: Arterial line  Intra-op Plan:   Post-operative Plan: Extubation in OR  Informed Consent: I have reviewed the patients History and Physical, chart, labs and discussed the procedure including the risks, benefits and alternatives for the proposed anesthesia with the  patient or authorized representative who has indicated his/her understanding and acceptance.     Dental advisory given  Plan Discussed with: CRNA  Anesthesia Plan Comments:        Anesthesia Quick Evaluation

## 2023-12-21 NOTE — Progress Notes (Signed)
 Dr. Arlester Ladd at bedside. Patient answering questions appropriately now.

## 2023-12-22 ENCOUNTER — Inpatient Hospital Stay (HOSPITAL_COMMUNITY)

## 2023-12-22 ENCOUNTER — Encounter (HOSPITAL_COMMUNITY): Payer: Self-pay | Admitting: Cardiovascular Disease

## 2023-12-22 DIAGNOSIS — Z95818 Presence of other cardiac implants and grafts: Principal | ICD-10-CM

## 2023-12-22 DIAGNOSIS — Z954 Presence of other heart-valve replacement: Secondary | ICD-10-CM | POA: Diagnosis not present

## 2023-12-22 DIAGNOSIS — E663 Overweight: Secondary | ICD-10-CM

## 2023-12-22 DIAGNOSIS — Z9889 Other specified postprocedural states: Principal | ICD-10-CM

## 2023-12-22 DIAGNOSIS — I340 Nonrheumatic mitral (valve) insufficiency: Principal | ICD-10-CM

## 2023-12-22 LAB — ECHOCARDIOGRAM COMPLETE
AR max vel: 2.66 cm2
AV Area VTI: 2.6 cm2
AV Area mean vel: 2.54 cm2
AV Mean grad: 6 mmHg
AV Peak grad: 10.8 mmHg
Ao pk vel: 1.64 m/s
Area-P 1/2: 2.27 cm2
Calc EF: 44.5 %
Height: 72 in
MV VTI: 2.37 cm2
S' Lateral: 5.1 cm
Single Plane A2C EF: 49.8 %
Single Plane A4C EF: 42.6 %
Weight: 3686.4 [oz_av]

## 2023-12-22 LAB — CBC
HCT: 40.3 % (ref 39.0–52.0)
Hemoglobin: 13.1 g/dL (ref 13.0–17.0)
MCH: 29.4 pg (ref 26.0–34.0)
MCHC: 32.5 g/dL (ref 30.0–36.0)
MCV: 90.4 fL (ref 80.0–100.0)
Platelets: 146 10*3/uL — ABNORMAL LOW (ref 150–400)
RBC: 4.46 MIL/uL (ref 4.22–5.81)
RDW: 14.4 % (ref 11.5–15.5)
WBC: 7 10*3/uL (ref 4.0–10.5)
nRBC: 0 % (ref 0.0–0.2)

## 2023-12-22 LAB — BASIC METABOLIC PANEL WITH GFR
Anion gap: 12 (ref 5–15)
BUN: 15 mg/dL (ref 8–23)
CO2: 22 mmol/L (ref 22–32)
Calcium: 8.3 mg/dL — ABNORMAL LOW (ref 8.9–10.3)
Chloride: 105 mmol/L (ref 98–111)
Creatinine, Ser: 0.98 mg/dL (ref 0.61–1.24)
GFR, Estimated: >60 mL/min (ref >60)
Glucose, Bld: 159 mg/dL — ABNORMAL HIGH (ref 70–99)
Potassium: 4.2 mmol/L (ref 3.5–5.1)
Sodium: 139 mmol/L (ref 135–145)

## 2023-12-22 MED ORDER — PERFLUTREN LIPID MICROSPHERE
1.0000 mL | INTRAVENOUS | Status: AC | PRN
  Administered 2023-12-22: 5 mL via INTRAVENOUS

## 2023-12-22 MED FILL — Fentanyl Citrate Preservative Free (PF) Inj 100 MCG/2ML: INTRAMUSCULAR | Qty: 2 | Status: AC

## 2023-12-22 NOTE — TOC CM/SW Note (Signed)
 Transition of Care [facility]) - Inpatient Brief Assessment   Patient Details  Name: [name] MRN: [id number] Date of Birth: 1935-06-13  Transition of Care [facility]) CM/SW Contact:    Juliane Och, LCSW Phone Number: 12/22/2023, 9:05 AM   Clinical Narrative:  9:05 AM Per chart review, patient resides at home with friends. Patient has a PCP and insurance. Patient does not have SNF/HH/DME history. Patient's preferred pharmacy is [facility] [phone] [facility]. No TOC needs were identified at this time. TOC will continue to follow and be available to assist.  Transition of Care Asessment: Insurance and Status: Insurance coverage has been reviewed Patient has primary care physician: Yes Home environment has been reviewed: Private Residence Prior level of function:: N/A Prior/Current Home Services: No current home services Social Drivers of Health Review: SDOH reviewed no interventions necessary Readmission risk has been reviewed: Yes Transition of care needs: no transition of care needs at this time

## 2023-12-22 NOTE — Progress Notes (Signed)
 CARDIAC REHAB PHASE I     Post Mitral valve clip education including site care, restrictions, risk factors, heart healthy diabetic diet, exercise guidelines and CRP2 reviewed. All questions and concerns addressed. Will send referral to [facility] for CRP2. Plan for discharge home later today.   [phone] Ronny Colas, RN BSN 12/22/2023 9:16 AM

## 2023-12-22 NOTE — Progress Notes (Signed)
 An After Visit Summary was printed and given to the patient.

## 2023-12-22 NOTE — TOC Progression Note (Signed)
 Transition of Care [facility]) - Progression Note    Patient Details  Name: [name] MRN: [id number] Date of Birth: 09-29-1934  Transition of Care [facility]) CM/SW Contact  Jeani Mill, RN Phone Number: 12/22/2023, 10:48 AM  Clinical Narrative:     [name] is stable to discharge home.  No TOC needs at this time.       Expected Discharge Plan and Services         Expected Discharge Date: 12/22/23                                     Social Determinants of Health (SDOH) Interventions SDOH Screenings   Food Insecurity: No Food Insecurity (12/22/2023)  Housing: Low Risk  (12/22/2023)  Transportation Needs: No Transportation Needs (12/22/2023)  Utilities: Not At Risk (12/22/2023)  Social Connections: Socially Isolated (08/30/2023)  Tobacco Use: Medium Risk (12/21/2023)    Readmission Risk Interventions    12/22/2023   10:43 AM 10/29/2021   11:16 AM  Readmission Risk Prevention Plan  Transportation Screening Complete Complete  PCP or Specialist Appt within 5-7 Days Not Complete   Not Complete comments 6/26 follow up cards   Home Care Screening Complete Complete  Medication Review (RN CM) Complete Complete

## 2023-12-22 NOTE — Anesthesia Postprocedure Evaluation (Addendum)
 Anesthesia Post Note  Patient: [name]  Procedure(s) Performed: TRANSCATHETER MITRAL EDGE TO EDGE REPAIR TRANSESOPHAGEAL ECHOCARDIOGRAM     Patient location during evaluation: SICU Anesthesia Type: General Level of consciousness: awake and alert Pain management: pain level controlled Vital Signs Assessment: post-procedure vital signs reviewed and stable Respiratory status: spontaneous breathing, nonlabored ventilation, respiratory function stable and patient connected to nasal cannula oxygen Cardiovascular status: blood pressure returned to baseline and stable Postop Assessment: no apparent nausea or vomiting Anesthetic complications: no  No notable events documented.  Last Vitals:  Vitals:   12/22/23 0623 12/22/23 0700  BP: 114/66 122/71  Pulse: 80 76  Resp:  20  Temp:  36.7 C  SpO2:  91%    Last Pain:  Vitals:   12/22/23 0700  TempSrc: Axillary  PainSc:                  Willian Harrow

## 2023-12-23 ENCOUNTER — Telehealth: Payer: Self-pay

## 2023-12-23 NOTE — Telephone Encounter (Signed)
 TOC call attempted by Jeronimo Moors.  Message was left to call back.

## 2023-12-24 LAB — BPAM RBC
Blood Product Expiration Date: 202507052359
Blood Product Expiration Date: 202507162359

## 2023-12-24 LAB — TYPE AND SCREEN
ABO/RH(D): A NEG
Antibody Screen: POSITIVE
Donor AG Type: NEGATIVE
Donor AG Type: NEGATIVE

## 2023-12-26 ENCOUNTER — Ambulatory Visit: Admitting: Physician Assistant

## 2023-12-26 NOTE — Telephone Encounter (Signed)
 TOC call attempt #2.  Left message to call back.

## 2023-12-27 NOTE — Progress Notes (Addendum)
 HEART AND VASCULAR CENTER   MULTIDISCIPLINARY HEART VALVE CLINIC                                     Cardiology Office Note:    Date:  01/01/2024   ID:  [name], DOB 1935/01/07, MRN [id number]  PCP:  Onita Rush, MD  [facility] Cardiologist:  Ozell Fell, MD  [facility] Structural heart: Ozell Fell, MD [facility] Electrophysiologist:  Lynwood Rakers, MD (Inactive)   Referring MD: Onita Rush, MD   [facility] s/p mTEER  History of Present Illness:    [name] is a 89 y.o. 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   He was admitted 2/24-08/11/23 with acute on chronic heart failure after discontinuation of Entresto  and presenting with hypertensive urgency. Echo at that time showed EF 60% with mod-severe MR. 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 [facility] 11/14/23 showed patent coronary arteries with moderate three-vessel disease, diffuse calcification, and no focal high-grade stenoses. Wedge pressure was 20 mmHg with a V wave of 27 mmHg and mean PA pressure was 24 mmHg with preserved cardiac output of 4.9 L/min. He was seen by Dr. Shyrl who deemed him a nonoperative candidate due to age, reduced functional capacity, and presence of mild dementia. S/p mTEER with Mitraclip NTW x 2 with a reduction from 3-4+ MR to 1 + residual MR. Post op echo showed EF 45-50%, mild RV dysfunction, s/p mTEER with very mild residual MR with a mean gradient of  3.5 mm hg.   Today the patient presents to clinic for follow up. He is not doing as well as he would have hoped. Still having SOB. Can't work on his AirBnB as much as he would like. No CP. No LE edema, orthopnea or PND. No dizziness or syncope. No blood in stool or urine. No palpitations. Twisted ankle which is limiting his mobility.    Past Medical History:  Diagnosis Date   (HFimpEF) heart failure with improved EF    Echo 04/2019: EF 35-40 // Echo 5/22: EF 55-60, no RWMA, GR 1 DD, moderate to severe MR, myxomatous mitral valve, similar to prior echocardiogram, mildly elevated PASP, RVSP 35.9, mild AI, mild AV sclerosis without AS, mild dilation of ascending aorta (43 mm)   A-fib (HCC)    RVR-DCCV   Abnormal PSA 01/17/2012   10.38/4/5/ PSA:2013=6.96/ PSA 07/08/2011=6.26/ PSA 2006=8.6   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/16/2021   Echocardiogram March 2023: 41 mm   Blood transfusion without reported diagnosis    during vagotomy 1976   CAD (coronary artery disease)    Chronic kidney disease    nephrolithiasis right kidney   Coronary artery disease    ED (erectile dysfunction)    GERD (gastroesophageal reflux disease)    PUD S/P PARTIAL VAGOTOMY   Gynecomastia 04/21/2011   seen by Dr.Murray, no rad tx   H/O impacted cerumen  S/P ENT DR BATES   History of echocardiogram    echo 4/16:  mild LVH, EF 55-60%, Gr 1 DD, no RWMA, mild AI, mild MR, mod LAE, mild TR   Hydroureteronephrosis    right    Hyperlipidemia    Hypertension    Insomnia    Migraines    WITH AURA   Nephrolithiasis    Osteopenia    Overweight    Prostate cancer (HCC) 12/09/03 dx   Prostate ca/adenocarcinoma,gleason=3+3=6    pSA 4.6   RBBB (right bundle branch block)    Rosacea    S/P mitral valve clip implantation 12/22/2023   s/p Mitraclip NTW x 2 with a reduction from 3-4+ MR to 1 + residual MR.   Sinus bradycardia    Sleep apnea    pt unaware, no sleep study   Stroke [facility])    asymptomatic, found on head  imaging     Current Medications: Current Meds  Medication Sig   apixaban  (ELIQUIS ) 5 MG TABS tablet Take 1 tablet (5 mg total) by mouth 2 (two) times daily.   ascorbic acid (VITAMIN C) 500 MG tablet Take 500 mg by mouth daily.   carvedilol  (COREG ) 3.125 MG tablet Take 1 tablet (3.125 mg total) by mouth 2 (two) times daily with a meal.   Cholecalciferol (VITAMIN D) 50 MCG (2000 UT) tablet Take 2,000 Units by mouth daily. (Patient taking differently: Take 2,000 Units by mouth daily. Pt needs refill, he has not taken it in a while)   sacubitril -valsartan  (ENTRESTO ) 24-26 MG Take 1 tablet by mouth 2 (two) times daily.   [DISCONTINUED] furosemide  (LASIX ) 20 MG tablet Take 1 tablet (20 mg total) by mouth daily.      ROS:   Please see the history of present illness.    All other systems reviewed and are negative.  EKGs       Risk Assessment/Calculations:    CHA2DS2-VASc Score = 7   This indicates a 11.2% annual risk of stroke. The patient's score is based upon: CHF History: 1 HTN History: 1 Diabetes History: 1 Stroke History: 2 Vascular Disease History: 0 Age Score: 2 Gender Score: 0          Physical Exam:    VS:  BP (!) 148/64   Pulse 70   Ht 6' (1.829 m)   Wt 240 lb 3.2 oz (109 kg)   SpO2 95%   BMI 32.58 kg/m     Wt Readings from Last 3 Encounters:  12/29/23 240 lb 3.2 oz (109 kg)  12/22/23 230 lb 6.4 oz (104.5 kg)  12/13/23 239 lb 6.4 oz (108.6 kg)     GEN: Well nourished, well developed in no acute distress NECK: No JVD CARDIAC: RRR, no murmurs, rubs, gallops RESPIRATORY:  Clear to auscultation without rales, wheezing or rhonchi  ABDOMEN: Soft, non-tender, non-distended EXTREMITIES:  No edema; No deformity.  Groin site clear without hematoma or ecchymosis.   ASSESSMENT:    1. S/P mitral valve clip implantation   2. Paroxysmal atrial fibrillation (HCC)   3. Essential hypertension   4. Chronic heart failure with mildly reduced ejection fraction  (HFmrEF, 41-49%) (HCC)   5. Coronary artery disease involving native coronary artery of native heart without angina pectoris     PLAN:    In order of problems listed above:  Severe MR s/p mTEER:  -- Pt doing okay s/p mTEER.  -- Groin site healing well.  -- SBE x 6 months discussed. He will  defer dental work.  -- Continue Eliquis  5mg  BID. -- Cleared to resume all activities without restriction. -- Encouraged participation in cardiac rehab when ankle heals up.  -- I will see back for 1 month echo and OV.  PAF: -- Sounds regular on exam.  -- HR originally assessed as 45 but this was by pulse ox and confounded by ectopy. HRs in 70s on my exam. -- Continue Eliquis  5mg  BID.    HTN: -- BP mildly elevated.  -- Continue Entresto  24-26 mg BID.  -- Continue Coreg  3.125mg  BID. -- Increase Lasix  20mg  daily to 40mg  daily.    Chronic HFmrEF: -- EF 45-50% on POD1 echo.  -- Appears euvolemic, but weight up 10 lbs and still SOB. -- Continue Entresto  24-26 mg BID.  -- Continue Coreg  3.125mg  BID. -- BMET, BNP today.  -- Increase Lasix  20mg  daily to 40mg  daily.    CAD: -- Cath 11/14/23 showed patent coronary arteries with moderate three-vessel disease, diffuse calcification, and no focal high-grade stenoses -- No angina. -- No antiplatelet given need for OAC.  -- Statin intolerant.  -- Continue Zetia  10mg  daily.      Cardiac Rehabilitation Eligibility Assessment  The patient is ready to start cardiac rehabilitation from a cardiac standpoint.    Medication Adjustments/Labs and Tests Ordered: Current medicines are reviewed at length with the patient today.  Concerns regarding medicines are outlined above.  Orders Placed This Encounter  Procedures   Basic metabolic panel with GFR   B Nat Peptide   ECHOCARDIOGRAM COMPLETE   Meds ordered this encounter  Medications   furosemide  (LASIX ) 20 MG tablet    Sig: Take 2 tablets (40 mg total) by mouth daily.    Dispense:  90 tablet     Refill:  3    Supervising Provider:   WONDA SHARPER [[id number]]    Patient Instructions  Medication Instructions:  Please increase your furosemide  from 20mg  to 40mg  daily.    You will need antibiotics before dental work. Please call us  if you need a prescription. This is only required for 6 months. *If you need a refill on your cardiac medications before your next appointment, please call your pharmacy*  Lab Work: BMET and BNP If you have labs (blood work) drawn today and your tests are completely normal, you will receive your results only by: MyChart Message (if you have MyChart) OR A paper copy in the mail If you have any lab test that is abnormal or we need to change your treatment, we will call you to review the results.  Testing/Procedures: Echo in 1 month   Follow-Up: At [facility], you and your health needs are our priority.  As part of our continuing mission to provide you with exceptional heart care, our providers are all part of one team.  This team includes your primary Cardiologist (physician) and Advanced Practice Providers or APPs (Physician Assistants and Nurse Practitioners) who all work together to provide you with the care you need, when you need it.  Your next appointment:    Please keep your follow up as scheduled.      Signed, Lamarr Hummer, PA-C  01/01/2024 9:15 PM    [facility]

## 2023-12-29 ENCOUNTER — Ambulatory Visit: Attending: Physician Assistant | Admitting: Physician Assistant

## 2023-12-29 VITALS — BP 148/64 | HR 70 | Ht 72.0 in | Wt 240.2 lb

## 2023-12-29 DIAGNOSIS — Z9889 Other specified postprocedural states: Secondary | ICD-10-CM

## 2023-12-29 DIAGNOSIS — Z95818 Presence of other cardiac implants and grafts: Secondary | ICD-10-CM | POA: Diagnosis not present

## 2023-12-29 DIAGNOSIS — I5022 Chronic systolic (congestive) heart failure: Secondary | ICD-10-CM

## 2023-12-29 DIAGNOSIS — I10 Essential (primary) hypertension: Secondary | ICD-10-CM | POA: Diagnosis not present

## 2023-12-29 DIAGNOSIS — I2510 Atherosclerotic heart disease of native coronary artery without angina pectoris: Secondary | ICD-10-CM

## 2023-12-29 DIAGNOSIS — I480 Paroxysmal atrial fibrillation: Secondary | ICD-10-CM

## 2023-12-29 MED ORDER — FUROSEMIDE 20 MG PO TABS: 40.0000 mg | ORAL_TABLET | Freq: Every day | ORAL | 3 refills | Status: DC

## 2023-12-29 NOTE — Patient Instructions (Signed)
 Medication Instructions:  Please increase your furosemide  from 20mg  to 40mg  daily.    You will need antibiotics before dental work. Please call us  if you need a prescription. This is only required for 6 months. *If you need a refill on your cardiac medications before your next appointment, please call your pharmacy*  Lab Work: BMET and BNP If you have labs (blood work) drawn today and your tests are completely normal, you will receive your results only by: MyChart Message (if you have MyChart) OR A paper copy in the mail If you have any lab test that is abnormal or we need to change your treatment, we will call you to review the results.  Testing/Procedures: Echo in 1 month   Follow-Up: At [facility], you and your health needs are our priority.  As part of our continuing mission to provide you with exceptional heart care, our providers are all part of one team.  This team includes your primary Cardiologist (physician) and Advanced Practice Providers or APPs (Physician Assistants and Nurse Practitioners) who all work together to provide you with the care you need, when you need it.  Your next appointment:    Please keep your follow up as scheduled.

## 2023-12-30 ENCOUNTER — Telehealth (HOSPITAL_COMMUNITY): Payer: Self-pay

## 2023-12-30 LAB — BRAIN NATRIURETIC PEPTIDE: BNP: 391.7 pg/mL — ABNORMAL HIGH (ref 0.0–100.0)

## 2023-12-30 NOTE — Telephone Encounter (Signed)
 Attempted to call patient in regards to Cardiac Rehab - LM on VM

## 2023-12-30 NOTE — Telephone Encounter (Signed)
 Pt insurance is active and benefits verified through [facility] Medicare Co-pay 0, DED 0/0 met, out of pocket $3,900/$814.55 met, co-insurance 0%. no pre-authorization required. Passport, 12/30/2023@11 :21, REF# [id number]  How many CR sessions are covered? (ICR)72 Is this a lifetime maximum or an annual maximum? annual Has the member used any of these services to date? No  Is there a time limit (weeks/months) on start of program and/or program completion? no   Will contact patient to see if he is interested in the Cardiac Rehab Program. If interested, patient will need to complete follow up appt. Once completed, patient will be contacted for scheduling upon review by the RN Navigator.

## 2024-01-01 ENCOUNTER — Ambulatory Visit: Payer: Self-pay | Admitting: Physician Assistant

## 2024-01-10 ENCOUNTER — Telehealth: Payer: Self-pay | Admitting: Physician Assistant

## 2024-01-10 ENCOUNTER — Other Ambulatory Visit: Payer: Self-pay | Admitting: Physician Assistant

## 2024-01-10 DIAGNOSIS — I5022 Chronic systolic (congestive) heart failure: Secondary | ICD-10-CM

## 2024-01-10 MED ORDER — AMOXICILLIN 500 MG PO TABS
2000.0000 mg | ORAL_TABLET | ORAL | 12 refills | Status: AC
Start: 2024-01-10 — End: ?

## 2024-01-10 NOTE — Telephone Encounter (Signed)
 Spoke to pt and he will go get labs and also called in amoxicillin  for SBE prophylaxis as he has some dental pain.

## 2024-01-10 NOTE — Telephone Encounter (Signed)
 New Message:    Patient called and said he would like to talk to Lamarr Hummer please.

## 2024-01-17 ENCOUNTER — Encounter (HOSPITAL_COMMUNITY): Payer: Self-pay

## 2024-01-17 ENCOUNTER — Telehealth (HOSPITAL_COMMUNITY): Payer: Self-pay

## 2024-01-17 LAB — BASIC METABOLIC PANEL WITH GFR
BUN/Creatinine Ratio: 17 (ref 10–24)
BUN: 20 mg/dL (ref 8–27)
CO2: 24 mmol/L (ref 20–29)
Calcium: 9.5 mg/dL (ref 8.6–10.2)
Chloride: 101 mmol/L (ref 96–106)
Creatinine, Ser: 1.2 mg/dL (ref 0.76–1.27)
Glucose: 151 mg/dL — ABNORMAL HIGH (ref 70–99)
Potassium: 4.6 mmol/L (ref 3.5–5.2)
Sodium: 142 mmol/L (ref 134–144)
eGFR: 59 mL/min/1.73 — ABNORMAL LOW (ref >59)

## 2024-01-17 NOTE — Telephone Encounter (Signed)
Attempted to call patient in regards to Cardiac Rehab - Unable to leave VM, VM box is full   Sent letter

## 2024-01-25 ENCOUNTER — Encounter (HOSPITAL_COMMUNITY): Payer: Self-pay | Admitting: Cardiovascular Disease

## 2024-01-31 ENCOUNTER — Telehealth (HOSPITAL_COMMUNITY): Payer: Self-pay

## 2024-01-31 NOTE — Telephone Encounter (Signed)
 F/u call regarding cardiac rehab- patient is interested, unable to schedule today as his hearing aids aren't in. Will call back to schedule tomorrow.

## 2024-02-01 ENCOUNTER — Telehealth (HOSPITAL_COMMUNITY): Payer: Self-pay

## 2024-02-01 ENCOUNTER — Encounter (HOSPITAL_COMMUNITY): Payer: Self-pay

## 2024-02-01 NOTE — Telephone Encounter (Signed)
 Called patient to see if he was interested in participating in the Cardiac Rehab Program. Patient will come in for orientation on 8/14@1 :15 and will attend the 1:45 exercise class.   Sent package.

## 2024-02-01 NOTE — Progress Notes (Unsigned)
 HEART AND VASCULAR CENTER   MULTIDISCIPLINARY HEART VALVE CLINIC                                     Cardiology Office Note:    Date:  02/03/2024   ID:  [name], DOB 20-[name]-1935, MRN [id number]  PCP:  Onita Rush, MD  [facility] Cardiologist:  Ozell Fell, MD  [facility] Structural heart: Ozell Fell, MD [facility] Electrophysiologist:  Lynwood Rakers, MD (Inactive)   Referring MD: Onita Rush, MD   1 month s/p mTEER  History of Present Illness:    [name] is a 89 y.o. 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   He was admitted 2/24-08/11/23 with acute on chronic heart failure after discontinuation of Entresto  and presenting with hypertensive urgency. Echo at that time showed EF 60% with mod-severe MR. 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 [facility] 11/14/23 showed patent coronary arteries with moderate three-vessel disease, diffuse calcification, and no focal high-grade stenoses. Wedge pressure was 20 mmHg with a V wave of 27 mmHg and mean PA pressure was 24 mmHg with preserved cardiac output of 4.9 L/min. He was seen by Dr. Shyrl who deemed him a nonoperative candidate due to age, reduced functional capacity, and presence of mild dementia. S/p mTEER with Mitraclip NTW x 2 with a reduction from 3-4+ MR to 1 + residual MR. Post op echo showed EF 45-50%, mild RV dysfunction, s/p mTEER with very mild residual MR with a mean gradient of  3.5 mm hg. Had shortness of breath and 10 lbs weight gain at follow up and Lasix  increased to 40mg  daily.  Today the patient presents to clinic for follow up. Stays tired all the time due to nocturia and inability to sleep. He also sleeps until noon and then take Lasix  later in the day. He knows that he could do better in terms of exercise and sleep hygiene. Stays in bed most the day. No CP. He continues to have shortness of breath. No LE edema, orthopnea or PND. No dizziness or syncope. No blood in stool or urine. No palpitations.     Past Medical History:  Diagnosis Date   (HFimpEF) heart failure with improved EF    Echo 04/2019: EF 35-40 // Echo 5/22: EF 55-60, no RWMA, GR 1 DD, moderate to severe MR, myxomatous mitral valve, similar to prior echocardiogram, mildly elevated PASP, RVSP 35.9, mild AI, mild AV sclerosis without AS, mild dilation of ascending aorta (43 mm)   A-fib (HCC)    RVR-DCCV   Abnormal PSA 01/17/2012   10.38/4/5/ PSA:2013=6.96/ PSA 07/08/2011=6.26/ PSA 2006=8.6   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/16/2021   Echocardiogram March 2023: 41 mm   Blood transfusion without reported diagnosis    during vagotomy 1976   CAD (coronary artery disease)    Chronic kidney disease    nephrolithiasis right kidney   Coronary artery disease  ED (erectile dysfunction)    GERD (gastroesophageal reflux disease)    PUD S/P PARTIAL VAGOTOMY   Gynecomastia 04/21/2011   seen by Dr.Murray, no rad tx   H/O impacted cerumen    S/P ENT DR BATES   History of echocardiogram    echo 4/16:  mild LVH, EF 55-60%, Gr 1 DD, no RWMA, mild AI, mild MR, mod LAE, mild TR   Hydroureteronephrosis    right    Hyperlipidemia    Hypertension    Insomnia    Migraines    WITH AURA   Nephrolithiasis    Osteopenia    Overweight    Prostate cancer (HCC) 12/09/03 dx   Prostate ca/adenocarcinoma,gleason=3+3=6    pSA 4.6   RBBB (right bundle branch block)    Rosacea    S/P mitral valve clip implantation  12/22/2023   s/p Mitraclip NTW x 2 with a reduction from 3-4+ MR to 1 + residual MR.   Sinus bradycardia    Sleep apnea    pt unaware, no sleep study   Stroke [facility])    asymptomatic, found on head imaging     Current Medications: Current Meds  Medication Sig   amoxicillin  (AMOXIL ) 500 MG tablet Take 4 tablets (2,000 mg total) by mouth as directed. 1 hour prior to dental work including cleanings   apixaban  (ELIQUIS ) 5 MG TABS tablet Take 1 tablet (5 mg total) by mouth 2 (two) times daily.   ascorbic acid (VITAMIN C) 500 MG tablet Take 500 mg by mouth daily.   Carboxymethylcellulose Sodium (LUBRICANT EYE DROPS OP) Place 1 drop into both eyes daily as needed (dry eyes).   carvedilol  (COREG ) 3.125 MG tablet Take 1 tablet (3.125 mg total) by mouth 2 (two) times daily with a meal.   furosemide  (LASIX ) 20 MG tablet Take 2 tablets (40 mg total) by mouth daily. (Patient taking differently: Take 40 mg by mouth 2 (two) times daily.)   sacubitril -valsartan  (ENTRESTO ) 24-26 MG Take 1 tablet by mouth 2 (two) times daily.      ROS:   Please see the history of present illness.    All other systems reviewed and are negative.  EKGs       Risk Assessment/Calculations:    CHA2DS2-VASc Score = 7   This indicates a 11.2% annual risk of stroke. The patient's score is based upon: CHF History: 1 HTN History: 1 Diabetes History: 1 Stroke History: 2 Vascular Disease History: 0 Age Score: 2 Gender Score: 0          Physical Exam:    VS:  BP 132/70   Pulse 77   Ht 6' (1.829 m)   Wt 230 lb (104.3 kg)   SpO2 94%   BMI 31.19 kg/m     Wt Readings from Last 3 Encounters:  02/02/24 230 lb (104.3 kg)  12/29/23 240 lb 3.2 oz (109 kg)  12/22/23 230 lb 6.4 oz (104.5 kg)     GEN: Well nourished, well developed in no acute distress, obese. NECK: No JVD CARDIAC: RR, frequent ectopy, no murmurs, rubs, gallops RESPIRATORY:  Clear to auscultation without rales, wheezing or rhonchi  ABDOMEN:  Soft, non-tender, non-distended EXTREMITIES:  No edema; No deformity.     ASSESSMENT:    1. S/P mitral valve clip implantation   2. Paroxysmal atrial fibrillation (HCC)   3. Essential hypertension   4. Chronic heart failure with mildly reduced ejection fraction (HFmrEF, 41-49%) (HCC)   5. Coronary artery disease involving native coronary  artery of native heart without angina pectoris     PLAN:    In order of problems listed above:  Severe MR s/p mTEER:  -- Echo today shows EF 40-50% (variable due to arrhythmia) s/p mTEER with mild residual MR with a mean gradient of 4 mm hg and mild AI. -- He has NYHA class II symptoms, mostly due to insomnia and nocturia.  -- SBE x 6 months discussed. He will defer dental work.  -- Continue Eliquis  5mg  BID. -- Encouraged CRP2.  -- I will see back for 1 year echo and OV.  PAF: -- In sinus by echo tracing with frequent PVCs.  -- Continue Eliquis  5mg  BID.  -- Continue Coreg  3.125mg  BID.   HTN: -- BP well controlled in the context of CHF meds.  -- Continue Entresto  24-26 mg BID.  -- Continue Coreg  3.125mg  BID. -- Continue Lasix  40mg  daily.    Chronic HFmrEF: -- EF 40-50% (variable due to arrhythmia). -- Appears euvolemic after increase in Lasix , weight back down to his baseline.  -- Continue Entresto  24-26 mg BID.  -- Continue Coreg  3.125mg  BID. -- Continue Lasix  40mg  daily.    CAD: -- Cath 11/14/23 showed patent coronary arteries with moderate three-vessel disease, diffuse calcification, and no focal high-grade stenoses -- No angina. -- No antiplatelet given need for OAC.  -- Statin intolerant.  -- Continue Zetia  10mg  daily.       Medication Adjustments/Labs and Tests Ordered: Current medicines are reviewed at length with the patient today.  Concerns regarding medicines are outlined above.  No orders of the defined types were placed in this encounter.  No orders of the defined types were placed in this encounter.   Patient  Instructions  Medication Instructions:  Your physician recommends that you continue on your current medications as directed. Please refer to the Current Medication list given to you today.  *If you need a refill on your cardiac medications before your next appointment, please call your pharmacy*  Lab Work: NONE NEEDED If you have labs (blood work) drawn today and your tests are completely normal, you will receive your results only by: MyChart Message (if you have MyChart) OR A paper copy in the mail If you have any lab test that is abnormal or we need to change your treatment, we will call you to review the results.  Testing/Procedures: NONE NEEDED  Follow-Up: At [facility], you and your health needs are our priority.  As part of our continuing mission to provide you with exceptional heart care, our providers are all part of one team.  This team includes your primary Cardiologist (physician) and Advanced Practice Providers or APPs (Physician Assistants and Nurse Practitioners) who all work together to provide you with the care you need, when you need it.  Your next appointment:   As scheduled  Provider:   Ozell Fell, MD    We recommend signing up for the patient portal called MyChart.  Sign up information is provided on this After Visit Summary.  MyChart is used to connect with patients for Virtual Visits (Telemedicine).  Patients are able to view lab/test results, encounter notes, upcoming appointments, etc.  Non-urgent messages can be sent to your provider as well.   To learn more about what you can do with MyChart, go to [email or web address].        Signed, Lamarr Hummer, PA-C  02/03/2024 9:42 AM    [facility]

## 2024-02-02 ENCOUNTER — Ambulatory Visit (HOSPITAL_COMMUNITY)
Admission: RE | Admit: 2024-02-02 | Discharge: 2024-02-02 | Disposition: A | Discharge status: Home / Self Care | Source: Ambulatory Visit | Attending: Cardiology | Admitting: Cardiology

## 2024-02-02 ENCOUNTER — Ambulatory Visit: Attending: Physician Assistant | Admitting: Physician Assistant

## 2024-02-02 VITALS — BP 132/70 | HR 77 | Ht 72.0 in | Wt 230.0 lb

## 2024-02-02 DIAGNOSIS — Z95818 Presence of other cardiac implants and grafts: Secondary | ICD-10-CM

## 2024-02-02 DIAGNOSIS — I480 Paroxysmal atrial fibrillation: Secondary | ICD-10-CM | POA: Diagnosis not present

## 2024-02-02 DIAGNOSIS — Z9889 Other specified postprocedural states: Secondary | ICD-10-CM

## 2024-02-02 DIAGNOSIS — I5022 Chronic systolic (congestive) heart failure: Secondary | ICD-10-CM | POA: Insufficient documentation

## 2024-02-02 DIAGNOSIS — I2510 Atherosclerotic heart disease of native coronary artery without angina pectoris: Secondary | ICD-10-CM

## 2024-02-02 DIAGNOSIS — I10 Essential (primary) hypertension: Secondary | ICD-10-CM

## 2024-02-02 LAB — ECHOCARDIOGRAM COMPLETE
Area-P 1/2: 3.17 cm2
MV VTI: 1.74 cm2
S' Lateral: 4.97 cm

## 2024-02-02 NOTE — Patient Instructions (Signed)
 Medication Instructions:  Your physician recommends that you continue on your current medications as directed. Please refer to the Current Medication list given to you today.  *If you need a refill on your cardiac medications before your next appointment, please call your pharmacy*  Lab Work: NONE NEEDED If you have labs (blood work) drawn today and your tests are completely normal, you will receive your results only by: MyChart Message (if you have MyChart) OR A paper copy in the mail If you have any lab test that is abnormal or we need to change your treatment, we will call you to review the results.  Testing/Procedures: NONE NEEDED  Follow-Up: At [facility], you and your health needs are our priority.  As part of our continuing mission to provide you with exceptional heart care, our providers are all part of one team.  This team includes your primary Cardiologist (physician) and Advanced Practice Providers or APPs (Physician Assistants and Nurse Practitioners) who all work together to provide you with the care you need, when you need it.  Your next appointment:   As scheduled  Provider:   Ozell Fell, MD    We recommend signing up for the patient portal called MyChart.  Sign up information is provided on this After Visit Summary.  MyChart is used to connect with patients for Virtual Visits (Telemedicine).  Patients are able to view lab/test results, encounter notes, upcoming appointments, etc.  Non-urgent messages can be sent to your provider as well.   To learn more about what you can do with MyChart, go to [email or web address].

## 2024-02-03 NOTE — Addendum Note (Signed)
 Addended by: SEBASTIAN LAMARR SAUNDERS on: 02/03/2024 09:49 AM   Modules accepted: Orders

## 2024-02-16 ENCOUNTER — Encounter (HOSPITAL_COMMUNITY): Payer: Self-pay

## 2024-02-16 ENCOUNTER — Encounter (HOSPITAL_COMMUNITY)
Admission: RE | Admit: 2024-02-16 | Discharge: 2024-02-16 | Disposition: A | Discharge status: Home / Self Care | Source: Ambulatory Visit | Attending: Cardiovascular Disease | Admitting: Cardiovascular Disease

## 2024-02-16 VITALS — BP 142/82 | HR 68 | Ht 72.0 in | Wt 235.2 lb

## 2024-02-16 DIAGNOSIS — Z955 Presence of coronary angioplasty implant and graft: Secondary | ICD-10-CM

## 2024-02-16 DIAGNOSIS — I214 Non-ST elevation (NSTEMI) myocardial infarction: Secondary | ICD-10-CM

## 2024-02-16 DIAGNOSIS — Z48812 Encounter for surgical aftercare following surgery on the circulatory system: Secondary | ICD-10-CM | POA: Diagnosis present

## 2024-02-16 DIAGNOSIS — Z9889 Other specified postprocedural states: Secondary | ICD-10-CM | POA: Insufficient documentation

## 2024-02-16 NOTE — Progress Notes (Signed)
 Cardiac Rehab Medication Review by a Nurse  Does the patient  feel that his/her medications are working for him/her?  yes  Has the patient been experiencing any side effects to the medications prescribed?  no  Does the patient measure his/her own blood pressure or blood glucose at home?  no   Does the patient have any problems obtaining medications due to transportation or finances?   no  Understanding of regimen: good Understanding of indications: poor Potential of compliance: good    Nurse  comments: [name] is taking his medications as prescribed and does not have a good understanding of what his medications are for. Medications reviewed. Explanation provided to patient about medication and indications.Calvin Elpidio Quan RN BSN     Calvin Elpidio [name] 02/16/2024 1:33 PM

## 2024-02-16 NOTE — Progress Notes (Signed)
 Cardiac Individual Treatment Plan  Patient Details  Name: [name] MRN: [id number] Date of Birth: 11-24-1934 Referring Provider:   Flowsheet Row INTENSIVE CARDIAC REHAB ORIENT from 02/16/2024 in [facility]  Referring Provider Ozell Fell, MD    Initial Encounter Date:  Flowsheet Row INTENSIVE CARDIAC REHAB ORIENT from 02/16/2024 in [facility], [facility]  Date 02/16/24    Visit Diagnosis: Status post coronary artery stent placement  NSTEMI (non-ST elevated myocardial infarction) [facility])  Patient's Home Medications on Admission:  Current Outpatient Medications:    amoxicillin  (AMOXIL ) 500 MG tablet, Take 4 tablets (2,000 mg total) by mouth as directed. 1 hour prior to dental work including cleanings, Disp: 12 tablet, Rfl: 12   apixaban  (ELIQUIS ) 5 MG TABS tablet, Take 1 tablet (5 mg total) by mouth 2 (two) times daily., Disp: 180 tablet, Rfl: 2   ascorbic acid (VITAMIN C) 500 MG tablet, Take 500 mg by mouth daily., Disp: , Rfl:    Carboxymethylcellulose Sodium (LUBRICANT EYE DROPS OP), Place 1 drop into both eyes daily as needed (dry eyes)., Disp: , Rfl:    carvedilol  (COREG ) 3.125 MG tablet, Take 1 tablet (3.125 mg total) by mouth 2 (two) times daily with a meal., Disp: 180 tablet, Rfl: 3   Cholecalciferol (VITAMIN D) 50 MCG (2000 UT) tablet, Take 2,000 Units by mouth daily., Disp: , Rfl:    Cyanocobalamin (B-12) 2500 MCG TABS, Take 2,500 mcg by mouth daily., Disp: , Rfl:    furosemide  (LASIX ) 20 MG tablet, Take 2 tablets (40 mg total) by mouth daily., Disp: 90 tablet, Rfl: 3   sacubitril -valsartan  (ENTRESTO ) 24-26 MG, Take 1 tablet by mouth 2 (two) times daily., Disp: 180 tablet, Rfl: 2  Past Medical History: Past Medical History:  Diagnosis Date   (HFimpEF) heart failure with improved EF    Echo 04/2019: EF 35-40 // Echo 5/22: EF 55-60, no RWMA, GR 1 DD, moderate to  severe MR, myxomatous mitral valve, similar to prior echocardiogram, mildly elevated PASP, RVSP 35.9, mild AI, mild AV sclerosis without AS, mild dilation of ascending aorta (43 mm)   A-fib (HCC)    RVR-DCCV   Abnormal PSA 01/17/2012   10.38/4/5/ PSA:2013=6.96/ PSA 07/08/2011=6.26/ PSA 2006=8.6   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/16/2021   Echocardiogram March 2023: 41 mm   Blood transfusion without reported diagnosis    during vagotomy 1976   CAD (coronary artery disease)    Chronic kidney disease    nephrolithiasis right kidney   Coronary artery disease    ED (erectile dysfunction)    GERD (gastroesophageal reflux disease)    PUD S/P PARTIAL VAGOTOMY   Gynecomastia 04/21/2011   seen by Dr.Murray, no rad tx   H/O impacted cerumen    S/P ENT DR BATES   History of echocardiogram    echo 4/16:  mild LVH, EF 55-60%, Gr 1 DD, no RWMA, mild AI, mild MR, mod LAE, mild TR   Hydroureteronephrosis    right    Hyperlipidemia    Hypertension    Insomnia    Migraines    WITH AURA   Nephrolithiasis    Osteopenia    Overweight    Prostate cancer (HCC) 12/09/03 dx   Prostate ca/adenocarcinoma,gleason=3+3=6    pSA 4.6   RBBB (right bundle branch block)    Rosacea    S/P mitral valve clip implantation 12/22/2023   s/p Mitraclip NTW x 2  with a reduction from 3-4+ MR to 1 + residual MR.   Sinus bradycardia    Sleep apnea    pt unaware, no sleep study   Stroke [facility])    asymptomatic, found on head imaging    Tobacco Use: Social History   Tobacco Use  Smoking Status Former   Types: Pipe   Quit date: 07/05/1984   Years since quitting: 39.6  Smokeless Tobacco Never    Labs: Review Flowsheet  More data exists      Latest Ref Rng & Units 04/25/2019 11/15/2022 08/30/2023 11/14/2023 12/21/2023  Labs for ITP Cardiac and Pulmonary Rehab  Cholestrol 100 - 199 mg/dL - 786  - - -  LDL (calc) 0 - 99 mg/dL - 866  - - -  HDL-C >60 mg/dL - 42  - - -  Trlycerides 0 - 149 mg/dL - 784  - - -   Hemoglobin A1c 4.8 - 5.6 % - - 7.4  - -  PH, Arterial 7.35 - 7.45 7.411  - - 7.408  7.276   PCO2 arterial 32 - 48 mmHg 46.0  - - 38.7  53.4   Bicarbonate 20.0 - 28.0 mmol/L 32.0  29.5  29.2  - - 27.2  24.4  24.9   TCO2 22 - 32 mmol/L 34  31  31  - 23  29  26  26    Acid-base deficit 0.0 - 2.0 mmol/L - - - - 3.0   O2 Saturation % 76.0  77.0  98.0  - - 62  94  98     Details       Multiple values from one day are sorted in reverse-chronological order         Capillary Blood Glucose: Lab Results  Component Value Date   GLUCAP 158 (H) 12/21/2023   GLUCAP 152 (H) 11/14/2023   GLUCAP 182 (H) 11/14/2023   GLUCAP 106 (H) 04/26/2019   GLUCAP 119 (H) 04/26/2019     Exercise Target Goals: Exercise Program Goal: Individual exercise prescription set using results from initial 6 min walk test and THRR while considering  patient's activity barriers and safety.   Exercise Prescription Goal: Initial exercise prescription builds to 30-45 minutes a day of aerobic activity, 2-3 days per week.  Home exercise guidelines will be given to patient during program as part of exercise prescription that the participant will acknowledge.  Activity Barriers & Risk Stratification:  Activity Barriers & Cardiac Risk Stratification - 02/16/24 1559       Activity Barriers & Cardiac Risk Stratification   Activity Barriers Shortness of Breath;Balance Concerns;History of Falls;Joint Problems;Deconditioning    Cardiac Risk Stratification High   <5 METs on         6 Minute Walk:  6 Minute Walk     Row Name 02/16/24 1315         6 Minute Walk   Phase Initial     Distance 840 feet     Walk Time 6 minutes     # of Rest Breaks 0     MPH 16     METS 1.23     RPE 11     Perceived Dyspnea  0     VO2 Peak 4.3     Symptoms No     Resting HR 68 bpm     Resting BP 142/82     Resting Oxygen Saturation  96 %     Exercise Oxygen Saturation  during 6 min walk  97 %     Max Ex. HR 100 bpm     Max  Ex. BP 160/80     2 Minute Post BP 142/84        Oxygen Initial Assessment:   Oxygen Re-Evaluation:   Oxygen Discharge (Final Oxygen Re-Evaluation):   Initial Exercise Prescription:  Initial Exercise Prescription - 02/16/24 1500       Date of Initial Exercise RX and Referring Provider   Date 02/16/24    Referring Provider Ozell Fell, MD    Expected Discharge Date 05/09/24      NuStep   Level 1    SPM 60    Minutes 25    METs 1.9      Prescription Details   Frequency (times per week) 3    Duration Progress to 30 minutes of continuous aerobic without signs/symptoms of physical distress      Intensity   THRR 40-80% of Max Heartrate 54-107    Ratings of Perceived Exertion 11-13    Perceived Dyspnea 0-4      Progression   Progression Continue progressive overload as per policy without signs/symptoms or physical distress.      Resistance Training   Training Prescription Yes    Weight 2    Reps 10-15          Perform Capillary Blood Glucose checks as needed.  Exercise Prescription Changes:   Exercise Comments:   Exercise Goals and Review:   Exercise Goals     Row Name 02/16/24 1600             Exercise Goals   Increase Physical Activity Yes       Intervention Develop an individualized exercise prescription for aerobic and resistive training based on initial evaluation findings, risk stratification, comorbidities and participant's personal goals.;Provide advice, education, support and counseling about physical activity/exercise needs.       Expected Outcomes Long Term: Exercising regularly at least 3-5 days a week.;Short Term: Attend rehab on a regular basis to increase amount of physical activity.;Long Term: Add in home exercise to make exercise part of routine and to increase amount of physical activity.       Increase Strength and Stamina Yes       Intervention Provide advice, education, support and counseling about physical activity/exercise  needs.;Develop an individualized exercise prescription for aerobic and resistive training based on initial evaluation findings, risk stratification, comorbidities and participant's personal goals.       Expected Outcomes Short Term: Increase workloads from initial exercise prescription for resistance, speed, and METs.;Short Term: Perform resistance training exercises routinely during rehab and add in resistance training at home;Long Term: Improve cardiorespiratory fitness, muscular endurance and strength as measured by increased METs and functional capacity ( )       Able to understand and use rate of perceived exertion (RPE) scale Yes       Intervention Provide education and explanation on how to use RPE scale       Expected Outcomes Short Term: Able to use RPE daily in rehab to express subjective intensity level;Long Term:  Able to use RPE to guide intensity level when exercising independently       Knowledge and understanding of Target Heart Rate Range (THRR) Yes       Intervention Provide education and explanation of THRR including how the numbers were predicted and where they are located for reference       Expected Outcomes Short Term: Able to state/look up THRR;Long Term:  Able to use THRR to govern intensity when exercising independently;Short Term: Able to use daily as guideline for intensity in rehab       Understanding of Exercise Prescription Yes       Intervention Provide education, explanation, and written materials on patient's individual exercise prescription       Expected Outcomes Short Term: Able to explain program exercise prescription;Long Term: Able to explain home exercise prescription to exercise independently          Exercise Goals Re-Evaluation :   Discharge Exercise Prescription (Final Exercise Prescription Changes):   Nutrition:  Target Goals: Understanding of nutrition guidelines, daily intake of sodium 1500mg , cholesterol 200mg , calories 30% from fat and 7% or  less from saturated fats, daily to have 5 or more servings of fruits and vegetables.  Biometrics:  Pre Biometrics - 02/16/24 1400       Pre Biometrics   Waist Circumference 45 inches    Hip Circumference 47 inches    Waist to Hip Ratio 0.96 %    Triceps Skinfold 26 mm    % Body Fat 33.9 %    Grip Strength 22 kg    Flexibility 0 in    Single Leg Stand 1 seconds           Nutrition Therapy Plan and Nutrition Goals:   Nutrition Assessments:  MEDIFICTS Score Key: >=70 Need to make dietary changes  40-70 Heart Healthy Diet <= 40 Therapeutic Level Cholesterol Diet    Picture Your Plate Scores: <59 Unhealthy dietary pattern with much room for improvement. 41-50 Dietary pattern unlikely to meet recommendations for good health and room for improvement. 51-60 More healthful dietary pattern, with some room for improvement.  >60 Healthy dietary pattern, although there may be some specific behaviors that could be improved.    Nutrition Goals Re-Evaluation:   Nutrition Goals Re-Evaluation:   Nutrition Goals Discharge (Final Nutrition Goals Re-Evaluation):   Psychosocial: Target Goals: Acknowledge presence or absence of significant depression and/or stress, maximize coping skills, provide positive support system. Participant is able to verbalize types and ability to use techniques and skills needed for reducing stress and depression.  Initial Review & Psychosocial Screening:  Initial Psych Review & Screening - 02/16/24 1504       Initial Review   Current issues with Current Sleep Concerns;Current Stress Concerns;Current Depression    Source of Stress Concerns Unable to perform yard/household activities;Unable to participate in former interests or hobbies    Comments Kamaal says he sometimes feels depressed. Omarii is not on an antidepressant and is not interested in counseling at this time.  Demosthenes says he does not sleep well at night due to frequent urination. Discussed sleep  hygiene. Recommended trying chamomile tea. Orland says he has tried melatonin in the past without sucess. I encouraged Nyeem to discuss with his PCP.      Family Dynamics   Good Support System? Yes   Jobin lives alone. Abrian's ex wife lives in the area. Cassie has 2 daughters one lives in [place] and was affected by [place] the other daughter lives in [place].     Barriers   Psychosocial barriers to participate in program The patient should benefit from training in stress management and relaxation.      Screening Interventions   Interventions Encouraged to exercise;Provide feedback about the scores to participant    Expected Outcomes Long Term Goal: Stressors or current issues are controlled or eliminated.;Short Term goal: Identification and review with participant of any  Quality of Life or Depression concerns found by scoring the questionnaire.;Long Term goal: The participant improves quality of Life and PHQ9 Scores as seen by post scores and/or verbalization of changes          Quality of Life Scores:  Quality of Life - 02/16/24 1558       Quality of Life   Select Quality of Life      Quality of Life Scores   Health/Function Pre 22.29 %    Socioeconomic Pre 24.43 %    Psych/Spiritual Pre 22.5 %    Family Pre 27 %    GLOBAL Pre 22.95 %         Scores of 19 and below usually indicate a poorer quality of life in these areas.  A difference of  2-3 points is a clinically meaningful difference.  A difference of 2-3 points in the total score of the Quality of Life Index has been associated with significant improvement in overall quality of life, self-image, physical symptoms, and general health in studies assessing change in quality of life.  PHQ-9: Review Flowsheet       02/16/2024  Depression screen PHQ 2/9  Decreased Interest 2  Down, Depressed, Hopeless 2  PHQ - 2 Score 4  Altered sleeping 3  Tired, decreased energy 3  Change in appetite 1  Feeling bad or failure about  yourself  0  Trouble concentrating 0  Moving slowly or fidgety/restless 0  Suicidal thoughts 0  PHQ-9 Score 11  Difficult doing work/chores Not difficult at all   Interpretation of Total Score  Total Score Depression Severity:  1-4 = Minimal depression, 5-9 = Mild depression, 10-14 = Moderate depression, 15-19 = Moderately severe depression, 20-27 = Severe depression   Psychosocial Evaluation and Intervention:   Psychosocial Re-Evaluation:   Psychosocial Discharge (Final Psychosocial Re-Evaluation):   Vocational Rehabilitation: Provide vocational rehab assistance to qualifying candidates.   Vocational Rehab Evaluation & Intervention:  Vocational Rehab - 02/16/24 1519       Initial Vocational Rehab Evaluation & Intervention   Assessment shows need for Vocational Rehabilitation No   Ledon runs a air [employer]. Arlie does not need vocational rehab at this time.         Education: Education Goals: Education classes will be provided on a weekly basis, covering required topics. Participant will state understanding/return demonstration of topics presented.     Core Videos: Exercise    Move It!  Clinical staff conducted group or individual video education with verbal and written material and guidebook.  Patient learns the recommended Pritikin exercise program. Exercise with the goal of living a long, healthy life. Some of the health benefits of exercise include controlled diabetes, healthier blood pressure levels, improved cholesterol levels, improved heart and lung capacity, improved sleep, and better body composition. Everyone should speak with their doctor before starting or changing an exercise routine.  Biomechanical Limitations Clinical staff conducted group or individual video education with verbal and written material and guidebook.  Patient learns how biomechanical limitations can impact exercise and how we can mitigate and possibly overcome limitations to have an  impactful and balanced exercise routine.  Body Composition Clinical staff conducted group or individual video education with verbal and written material and guidebook.  Patient learns that body composition (ratio of muscle mass to fat mass) is a key component to assessing overall fitness, rather than body weight alone. Increased fat mass, especially visceral belly fat, can put us  at increased risk for  metabolic syndrome, type 2 diabetes, heart disease, and even death. It is recommended to combine diet and exercise (cardiovascular and resistance training) to improve your body composition. Seek guidance from your physician and exercise physiologist before implementing an exercise routine.  Exercise Action Plan Clinical staff conducted group or individual video education with verbal and written material and guidebook.  Patient learns the recommended strategies to achieve and enjoy long-term exercise adherence, including variety, self-motivation, self-efficacy, and positive decision making. Benefits of exercise include fitness, good health, weight management, more energy, better sleep, less stress, and overall well-being.  Medical   Heart Disease Risk Reduction Clinical staff conducted group or individual video education with verbal and written material and guidebook.  Patient learns our heart is our most vital organ as it circulates oxygen, nutrients, white blood cells, and hormones throughout the entire body, and carries waste away. Data supports a plant-based eating plan like the Pritikin Program for its effectiveness in slowing progression of and reversing heart disease. The video provides a number of recommendations to address heart disease.   Metabolic Syndrome and Belly Fat  Clinical staff conducted group or individual video education with verbal and written material and guidebook.  Patient learns what metabolic syndrome is, how it leads to heart disease, and how one can reverse it and keep it  from coming back. You have metabolic syndrome if you have 3 of the following 5 criteria: abdominal obesity, high blood pressure, high triglycerides, low HDL cholesterol, and high blood sugar.  Hypertension and Heart Disease Clinical staff conducted group or individual video education with verbal and written material and guidebook.  Patient learns that high blood pressure, or hypertension, is very common in the [place] . Hypertension is largely due to excessive salt intake, but other important risk factors include being overweight, physical inactivity, drinking too much alcohol , smoking, and not eating enough potassium from fruits and vegetables. High blood pressure is a leading risk factor for heart attack, stroke, congestive heart failure, dementia, kidney failure, and premature death. Long-term effects of excessive salt intake include stiffening of the arteries and thickening of heart muscle and organ damage. Recommendations include ways to reduce hypertension and the risk of heart disease.  Diseases of Our Time - Focusing on Diabetes Clinical staff conducted group or individual video education with verbal and written material and guidebook.  Patient learns why the best way to stop diseases of our time is prevention, through food and other lifestyle changes. Medicine (such as prescription pills and surgeries) is often only a Band-Aid on the problem, not a long-term solution. Most common diseases of our time include obesity, type 2 diabetes, hypertension, heart disease, and cancer. The Pritikin Program is recommended and has been proven to help reduce, reverse, and/or prevent the damaging effects of metabolic syndrome.  Nutrition   Overview of the Pritikin Eating Plan  Clinical staff conducted group or individual video education with verbal and written material and guidebook.  Patient learns about the Pritikin Eating Plan for disease risk reduction. The Pritikin Eating Plan emphasizes a wide  variety of unrefined, minimally-processed carbohydrates, like fruits, vegetables, whole grains, and legumes. Go, Caution, and Stop food choices are explained. Plant-based and lean animal proteins are emphasized. Rationale provided for low sodium intake for blood pressure control, low added sugars for blood sugar stabilization, and low added fats and oils for coronary artery disease risk reduction and weight management.  Calorie Density  Clinical staff conducted group or individual video education with verbal and written material  and guidebook.  Patient learns about calorie density and how it impacts the Pritikin Eating Plan. Knowing the characteristics of the food you choose will help you decide whether those foods will lead to weight gain or weight loss, and whether you want to consume more or less of them. Weight loss is usually a side effect of the Pritikin Eating Plan because of its focus on low calorie-dense foods.  Label Reading  Clinical staff conducted group or individual video education with verbal and written material and guidebook.  Patient learns about the Pritikin recommended label reading guidelines and corresponding recommendations regarding calorie density, added sugars, sodium content, and whole grains.  Dining Out - Part 1  Clinical staff conducted group or individual video education with verbal and written material and guidebook.  Patient learns that restaurant meals can be sabotaging because they can be so high in calories, fat, sodium, and/or sugar. Patient learns recommended strategies on how to positively address this and avoid unhealthy pitfalls.  Facts on Fats  Clinical staff conducted group or individual video education with verbal and written material and guidebook.  Patient learns that lifestyle modifications can be just as effective, if not more so, as many medications for lowering your risk of heart disease. A Pritikin lifestyle can help to reduce your risk of  inflammation and atherosclerosis (cholesterol build-up, or plaque, in the artery walls). Lifestyle interventions such as dietary choices and physical activity address the cause of atherosclerosis. A review of the types of fats and their impact on blood cholesterol levels, along with dietary recommendations to reduce fat intake is also included.  Nutrition Action Plan  Clinical staff conducted group or individual video education with verbal and written material and guidebook.  Patient learns how to incorporate Pritikin recommendations into their lifestyle. Recommendations include planning and keeping personal health goals in mind as an important part of their success.  Healthy Mind-Set    Healthy Minds, Bodies, Hearts  Clinical staff conducted group or individual video education with verbal and written material and guidebook.  Patient learns how to identify when they are stressed. Video will discuss the impact of that stress, as well as the many benefits of stress management. Patient will also be introduced to stress management techniques. The way we think, act, and feel has an impact on our hearts.  How Our Thoughts Can Heal Our Hearts  Clinical staff conducted group or individual video education with verbal and written material and guidebook.  Patient learns that negative thoughts can cause depression and anxiety. This can result in negative lifestyle behavior and serious health problems. Cognitive behavioral therapy is an effective method to help control our thoughts in order to change and improve our emotional outlook.  Additional Videos:  Exercise    Improving Performance  Clinical staff conducted group or individual video education with verbal and written material and guidebook.  Patient learns to use a non-linear approach by alternating intensity levels and lengths of time spent exercising to help burn more calories and lose more body fat. Cardiovascular exercise helps improve heart health,  metabolism, hormonal balance, blood sugar control, and recovery from fatigue. Resistance training improves strength, endurance, balance, coordination, reaction time, metabolism, and muscle mass. Flexibility exercise improves circulation, posture, and balance. Seek guidance from your physician and exercise physiologist before implementing an exercise routine and learn your capabilities and proper form for all exercise.  Introduction to Yoga  Clinical staff conducted group or individual video education with verbal and written material and guidebook.  Patient learns about yoga, a discipline of the coming together of mind, breath, and body. The benefits of yoga include improved flexibility, improved range of motion, better posture and core strength, increased lung function, weight loss, and positive self-image. Yoga's heart health benefits include lowered blood pressure, healthier heart rate, decreased cholesterol and triglyceride levels, improved immune function, and reduced stress. Seek guidance from your physician and exercise physiologist before implementing an exercise routine and learn your capabilities and proper form for all exercise.  Medical   Aging: Enhancing Your Quality of Life  Clinical staff conducted group or individual video education with verbal and written material and guidebook.  Patient learns key strategies and recommendations to stay in good physical health and enhance quality of life, such as prevention strategies, having an advocate, securing a Health Care Proxy and Power of Attorney, and keeping a list of medications and system for tracking them. It also discusses how to avoid risk for bone loss.  Biology of Weight Control  Clinical staff conducted group or individual video education with verbal and written material and guidebook.  Patient learns that weight gain occurs because we consume more calories than we burn (eating more, moving less). Even if your body weight is normal, you  may have higher ratios of fat compared to muscle mass. Too much body fat puts you at increased risk for cardiovascular disease, heart attack, stroke, type 2 diabetes, and obesity-related cancers. In addition to exercise, following the Pritikin Eating Plan can help reduce your risk.  Decoding Lab Results  Clinical staff conducted group or individual video education with verbal and written material and guidebook.  Patient learns that lab test reflects one measurement whose values change over time and are influenced by many factors, including medication, stress, sleep, exercise, food, hydration, pre-existing medical conditions, and more. It is recommended to use the knowledge from this video to become more involved with your lab results and evaluate your numbers to speak with your doctor.   Diseases of Our Time - Overview  Clinical staff conducted group or individual video education with verbal and written material and guidebook.  Patient learns that according to the CDC, 50% to 70% of chronic diseases (such as obesity, type 2 diabetes, elevated lipids, hypertension, and heart disease) are avoidable through lifestyle improvements including healthier food choices, listening to satiety cues, and increased physical activity.  Sleep Disorders Clinical staff conducted group or individual video education with verbal and written material and guidebook.  Patient learns how good quality and duration of sleep are important to overall health and well-being. Patient also learns about sleep disorders and how they impact health along with recommendations to address them, including discussing with a physician.  Nutrition  Dining Out - Part 2 Clinical staff conducted group or individual video education with verbal and written material and guidebook.  Patient learns how to plan ahead and communicate in order to maximize their dining experience in a healthy and nutritious manner. Included are recommended food choices  based on the type of restaurant the patient is visiting.   Fueling a Banker conducted group or individual video education with verbal and written material and guidebook.  There is a strong connection between our food choices and our health. Diseases like obesity and type 2 diabetes are very prevalent and are in large-part due to lifestyle choices. The Pritikin Eating Plan provides plenty of food and hunger-curbing satisfaction. It is easy to follow, affordable, and helps reduce health risks.  Menu  Workshop  Engineer, materials conducted group or individual video education with verbal and written material and guidebook.  Patient learns that restaurant meals can sabotage health goals because they are often packed with calories, fat, sodium, and sugar. Recommendations include strategies to plan ahead and to communicate with the manager, chef, or server to help order a healthier meal.  Planning Your Eating Strategy  Clinical staff conducted group or individual video education with verbal and written material and guidebook.  Patient learns about the Pritikin Eating Plan and its benefit of reducing the risk of disease. The Pritikin Eating Plan does not focus on calories. Instead, it emphasizes high-quality, nutrient-rich foods. By knowing the characteristics of the foods, we choose, we can determine their calorie density and make informed decisions.  Targeting Your Nutrition Priorities  Clinical staff conducted group or individual video education with verbal and written material and guidebook.  Patient learns that lifestyle habits have a tremendous impact on disease risk and progression. This video provides eating and physical activity recommendations based on your personal health goals, such as reducing LDL cholesterol, losing weight, preventing or controlling type 2 diabetes, and reducing high blood pressure.  Vitamins and Minerals  Clinical staff conducted group or individual video  education with verbal and written material and guidebook.  Patient learns different ways to obtain key vitamins and minerals, including through a recommended healthy diet. It is important to discuss all supplements you take with your doctor.   Healthy Mind-Set    Smoking Cessation  Clinical staff conducted group or individual video education with verbal and written material and guidebook.  Patient learns that cigarette smoking and tobacco addiction pose a serious health risk which affects millions of people. Stopping smoking will significantly reduce the risk of heart disease, lung disease, and many forms of cancer. Recommended strategies for quitting are covered, including working with your doctor to develop a successful plan.  Culinary   Becoming a Set designer conducted group or individual video education with verbal and written material and guidebook.  Patient learns that cooking at home can be healthy, cost-effective, quick, and puts them in control. Keys to cooking healthy recipes will include looking at your recipe, assessing your equipment needs, planning ahead, making it simple, choosing cost-effective seasonal ingredients, and limiting the use of added fats, salts, and sugars.  Cooking - Breakfast and Snacks  Clinical staff conducted group or individual video education with verbal and written material and guidebook.  Patient learns how important breakfast is to satiety and nutrition through the entire day. Recommendations include key foods to eat during breakfast to help stabilize blood sugar levels and to prevent overeating at meals later in the day. Planning ahead is also a key component.  Cooking - Educational psychologist conducted group or individual video education with verbal and written material and guidebook.  Patient learns eating strategies to improve overall health, including an approach to cook more at home. Recommendations include thinking of  animal protein as a side on your plate rather than center stage and focusing instead on lower calorie dense options like vegetables, fruits, whole grains, and plant-based proteins, such as beans. Making sauces in large quantities to freeze for later and leaving the skin on your vegetables are also recommended to maximize your experience.  Cooking - Healthy Salads and Dressing Clinical staff conducted group or individual video education with verbal and written material and guidebook.  Patient learns that vegetables, fruits, whole grains, and legumes are  the foundations of the Pritikin Eating Plan. Recommendations include how to incorporate each of these in flavorful and healthy salads, and how to create homemade salad dressings. Proper handling of ingredients is also covered. Cooking - Soups and [employer] - Soups and Desserts Clinical staff conducted group or individual video education with verbal and written material and guidebook.  Patient learns that Pritikin soups and desserts make for easy, nutritious, and delicious snacks and meal components that are low in sodium, fat, sugar, and calorie density, while high in vitamins, minerals, and filling fiber. Recommendations include simple and healthy ideas for soups and desserts.   Overview     The Pritikin Solution Program Overview Clinical staff conducted group or individual video education with verbal and written material and guidebook.  Patient learns that the results of the Pritikin Program have been documented in more than 100 articles published in peer-reviewed journals, and the benefits include reducing risk factors for (and, in some cases, even reversing) high cholesterol, high blood pressure, type 2 diabetes, obesity, and more! An overview of the three key pillars of the Pritikin Program will be covered: eating well, doing regular exercise, and having a healthy mind-set.  WORKSHOPS  Exercise: Exercise Basics: Building Your Action  Plan Clinical staff led group instruction and group discussion with PowerPoint presentation and patient guidebook. To enhance the learning environment the use of posters, models and videos may be added. At the conclusion of this workshop, patients will comprehend the difference between physical activity and exercise, as well as the benefits of incorporating both, into their routine. Patients will understand the FITT (Frequency, Intensity, Time, and Type) principle and how to use it to build an exercise action plan. In addition, safety concerns and other considerations for exercise and cardiac rehab will be addressed by the presenter. The purpose of this lesson is to promote a comprehensive and effective weekly exercise routine in order to improve patients' overall level of fitness.   Managing Heart Disease: Your Path to a Healthier Heart Clinical staff led group instruction and group discussion with PowerPoint presentation and patient guidebook. To enhance the learning environment the use of posters, models and videos may be added.At the conclusion of this workshop, patients will understand the anatomy and physiology of the heart. Additionally, they will understand how Pritikin's three pillars impact the risk factors, the progression, and the management of heart disease.  The purpose of this lesson is to provide a high-level overview of the heart, heart disease, and how the Pritikin lifestyle positively impacts risk factors.  Exercise Biomechanics Clinical staff led group instruction and group discussion with PowerPoint presentation and patient guidebook. To enhance the learning environment the use of posters, models and videos may be added. Patients will learn how the structural parts of their bodies function and how these functions impact their daily activities, movement, and exercise. Patients will learn how to promote a neutral spine, learn how to manage pain, and identify ways to improve  their physical movement in order to promote healthy living. The purpose of this lesson is to expose patients to common physical limitations that impact physical activity. Participants will learn practical ways to adapt and manage aches and pains, and to minimize their effect on regular exercise. Patients will learn how to maintain good posture while sitting, walking, and lifting.  Balance Training and Fall Prevention  Clinical staff led group instruction and group discussion with PowerPoint presentation and patient guidebook. To enhance the learning environment the use of posters, models  and videos may be added. At the conclusion of this workshop, patients will understand the importance of their sensorimotor skills (vision, proprioception, and the vestibular system) in maintaining their ability to balance as they age. Patients will apply a variety of balancing exercises that are appropriate for their current level of function. Patients will understand the common causes for poor balance, possible solutions to these problems, and ways to modify their physical environment in order to minimize their fall risk. The purpose of this lesson is to teach patients about the importance of maintaining balance as they age and ways to minimize their risk of falling.  WORKSHOPS   Nutrition:  Fueling a Ship broker led group instruction and group discussion with PowerPoint presentation and patient guidebook. To enhance the learning environment the use of posters, models and videos may be added. Patients will review the foundational principles of the Pritikin Eating Plan and understand what constitutes a serving size in each of the food groups. Patients will also learn Pritikin-friendly foods that are better choices when away from home and review make-ahead meal and snack options. Calorie density will be reviewed and applied to three nutrition priorities: weight maintenance, weight loss, and weight  gain. The purpose of this lesson is to reinforce (in a group setting) the key concepts around what patients are recommended to eat and how to apply these guidelines when away from home by planning and selecting Pritikin-friendly options. Patients will understand how calorie density may be adjusted for different weight management goals.  Mindful Eating  Clinical staff led group instruction and group discussion with PowerPoint presentation and patient guidebook. To enhance the learning environment the use of posters, models and videos may be added. Patients will briefly review the concepts of the Pritikin Eating Plan and the importance of low-calorie dense foods. The concept of mindful eating will be introduced as well as the importance of paying attention to internal hunger signals. Triggers for non-hunger eating and techniques for dealing with triggers will be explored. The purpose of this lesson is to provide patients with the opportunity to review the basic principles of the Pritikin Eating Plan, discuss the value of eating mindfully and how to measure internal cues of hunger and fullness using the Hunger Scale. Patients will also discuss reasons for non-hunger eating and learn strategies to use for controlling emotional eating.  Targeting Your Nutrition Priorities Clinical staff led group instruction and group discussion with PowerPoint presentation and patient guidebook. To enhance the learning environment the use of posters, models and videos may be added. Patients will learn how to determine their genetic susceptibility to disease by reviewing their family history. Patients will gain insight into the importance of diet as part of an overall healthy lifestyle in mitigating the impact of genetics and other environmental insults. The purpose of this lesson is to provide patients with the opportunity to assess their personal nutrition priorities by looking at their family history, their own health history  and current risk factors. Patients will also be able to discuss ways of prioritizing and modifying the Pritikin Eating Plan for their highest risk areas  Menu  Clinical staff led group instruction and group discussion with PowerPoint presentation and patient guidebook. To enhance the learning environment the use of posters, models and videos may be added. Using menus brought in from [employer], or printed from [employer], patients will apply the Pritikin dining out guidelines that were presented in the [employer] video. Patients will also be  able to practice these guidelines in a variety of provided scenarios. The purpose of this lesson is to provide patients with the opportunity to practice hands-on learning of the Pritikin Dining Out guidelines with actual menus and practice scenarios.  Label Reading Clinical staff led group instruction and group discussion with PowerPoint presentation and patient guidebook. To enhance the learning environment the use of posters, models and videos may be added. Patients will review and discuss the Pritikin label reading guidelines presented in Pritikin's Label Reading Educational series video. Using fool labels brought in from local grocery stores and markets, patients will apply the label reading guidelines and determine if the packaged food meet the Pritikin guidelines. The purpose of this lesson is to provide patients with the opportunity to review, discuss, and practice hands-on learning of the Pritikin Label Reading guidelines with actual packaged food labels. Cooking School  Pritikin's [employer] are designed to teach patients ways to prepare quick, simple, and affordable recipes at home. The importance of nutrition's role in chronic disease risk reduction is reflected in its emphasis in the overall Pritikin program. By learning how to prepare essential core Pritikin Eating Plan recipes, patients will increase control over  what they eat; be able to customize the flavor of foods without the use of added salt, sugar, or fat; and improve the quality of the food they consume. By learning a set of core recipes which are easily assembled, quickly prepared, and affordable, patients are more likely to prepare more healthy foods at home. These workshops focus on convenient breakfasts, simple entres, side dishes, and desserts which can be prepared with minimal effort and are consistent with nutrition recommendations for cardiovascular risk reduction. Cooking [employer] are taught by a Armed forces logistics/support/administrative officer (RD) who has been trained by the [employer]. The chef or RD has a clear understanding of the importance of minimizing - if not completely eliminating - added fat, sugar, and sodium in recipes. Throughout the series of Cooking School Workshop sessions, patients will learn about healthy ingredients and efficient methods of cooking to build confidence in their capability to prepare    Cooking School weekly topics:  Adding Flavor- Sodium-Free  Fast and Healthy Breakfasts  Powerhouse Plant-Based Proteins  Satisfying Salads and Dressings  Simple Sides and Sauces  International Cuisine-Spotlight on the [employer] Zones  Delicious Desserts  Savory Soups  [employer] - Meals in a Astronomer Appetizers and Snacks  Comforting Weekend Breakfasts  One-Pot Wonders   Fast Evening Meals  Landscape architect Your Pritikin Plate  WORKSHOPS   Healthy Mindset (Psychosocial):  Focused Goals, Sustainable Changes Clinical staff led group instruction and group discussion with PowerPoint presentation and patient guidebook. To enhance the learning environment the use of posters, models and videos may be added. Patients will be able to apply effective goal setting strategies to establish at least one personal goal, and then take consistent, meaningful action toward that goal. They will learn to  identify common barriers to achieving personal goals and develop strategies to overcome them. Patients will also gain an understanding of how our mind-set can impact our ability to achieve goals and the importance of cultivating a positive and growth-oriented mind-set. The purpose of this lesson is to provide patients with a deeper understanding of how to set and achieve personal goals, as well as the tools and strategies needed to overcome common obstacles which may arise along the way.  From Head to Heart: The Power of  a Healthy Outlook  Clinical staff led group instruction and group discussion with PowerPoint presentation and patient guidebook. To enhance the learning environment the use of posters, models and videos may be added. Patients will be able to recognize and describe the impact of emotions and mood on physical health. They will discover the importance of self-care and explore self-care practices which may work for them. Patients will also learn how to utilize the 4 C's to cultivate a healthier outlook and better manage stress and challenges. The purpose of this lesson is to demonstrate to patients how a healthy outlook is an essential part of maintaining good health, especially as they continue their cardiac rehab journey.  Healthy Sleep for a Healthy Heart Clinical staff led group instruction and group discussion with PowerPoint presentation and patient guidebook. To enhance the learning environment the use of posters, models and videos may be added. At the conclusion of this workshop, patients will be able to demonstrate knowledge of the importance of sleep to overall health, well-being, and quality of life. They will understand the symptoms of, and treatments for, common sleep disorders. Patients will also be able to identify daytime and nighttime behaviors which impact sleep, and they will be able to apply these tools to help manage sleep-related challenges. The purpose of this lesson is to  provide patients with a general overview of sleep and outline the importance of quality sleep. Patients will learn about a few of the most common sleep disorders. Patients will also be introduced to the concept of "sleep hygiene," and discover ways to self-manage certain sleeping problems through simple daily behavior changes. Finally, the workshop will motivate patients by clarifying the links between quality sleep and their goals of heart-healthy living.   Recognizing and Reducing Stress Clinical staff led group instruction and group discussion with PowerPoint presentation and patient guidebook. To enhance the learning environment the use of posters, models and videos may be added. At the conclusion of this workshop, patients will be able to understand the types of stress reactions, differentiate between acute and chronic stress, and recognize the impact that chronic stress has on their health. They will also be able to apply different coping mechanisms, such as reframing negative self-talk. Patients will have the opportunity to practice a variety of stress management techniques, such as deep abdominal breathing, progressive muscle relaxation, and/or guided imagery.  The purpose of this lesson is to educate patients on the role of stress in their lives and to provide healthy techniques for coping with it.  Learning Barriers/Preferences:  Learning Barriers/Preferences - 02/16/24 1600       Learning Barriers/Preferences   Learning Barriers Hearing;Sight    Learning Preferences Audio;Computer/Internet;Group Instruction;Skilled Demonstration;Verbal Instruction;Video;Written Material;Individual Instruction;Pictoral          Education Topics:  Knowledge Questionnaire Score:  Knowledge Questionnaire Score - 02/16/24 1558       Knowledge Questionnaire Score   Pre Score 20/24          Core Components/Risk Factors/Patient Goals at Admission:  Personal Goals and Risk Factors at Admission -  02/16/24 1601       Core Components/Risk Factors/Patient Goals on Admission    Weight Management Yes;Obesity    Intervention Weight Management: Provide education and appropriate resources to help participant work on and attain dietary goals.;Weight Management: Develop a combined nutrition and exercise program designed to reach desired caloric intake, while maintaining appropriate intake of nutrient and fiber, sodium and fats, and appropriate energy expenditure required for the weight goal.;Weight  Management/Obesity: Establish reasonable short term and long term weight goals.;Obesity: Provide education and appropriate resources to help participant work on and attain dietary goals.    Expected Outcomes Long Term: Adherence to nutrition and physical activity/exercise program aimed toward attainment of established weight goal;Short Term: Continue to assess and modify interventions until short term weight is achieved;Understanding recommendations for meals to include 15-35% energy as protein, 25-35% energy from fat, 35-60% energy from carbohydrates, less than 200mg  of dietary cholesterol, 20-35 gm of total fiber daily;Understanding of distribution of calorie intake throughout the day with the consumption of 4-5 meals/snacks    Diabetes Yes    Intervention Provide education about signs/symptoms and action to take for hypo/hyperglycemia.;Provide education about proper nutrition, including hydration, and aerobic/resistive exercise prescription along with prescribed medications to achieve blood glucose in normal ranges: Fasting glucose 65-99 mg/dL    Expected Outcomes Short Term: Participant verbalizes understanding of the signs/symptoms and immediate care of hyper/hypoglycemia, proper foot care and importance of medication, aerobic/resistive exercise and nutrition plan for blood glucose control.;Long Term: Attainment of HbA1C < 7%.    Heart Failure Yes    Intervention Provide a combined exercise and nutrition  program that is supplemented with education, support and counseling about heart failure. Directed toward relieving symptoms such as shortness of breath, decreased exercise tolerance, and extremity edema.    Expected Outcomes Improve functional capacity of life;Short term: Attendance in program 2-3 days a week with increased exercise capacity. Reported lower sodium intake. Reported increased fruit and vegetable intake. Reports medication compliance.;Short term: Daily weights obtained and reported for increase. Utilizing diuretic protocols set by physician.;Long term: Adoption of self-care skills and reduction of barriers for early signs and symptoms recognition and intervention leading to self-care maintenance.    Hypertension Yes    Intervention Provide education on lifestyle modifcations including regular physical activity/exercise, weight management, moderate sodium restriction and increased consumption of fresh fruit, vegetables, and low fat dairy, alcohol  moderation, and smoking cessation.;Monitor prescription use compliance.    Expected Outcomes Short Term: Continued assessment and intervention until BP is < 140/48mm HG in hypertensive participants. < 130/20mm HG in hypertensive participants with diabetes, heart failure or chronic kidney disease.;Long Term: Maintenance of blood pressure at goal levels.    Lipids Yes    Intervention Provide education and support for participant on nutrition & aerobic/resistive exercise along with prescribed medications to achieve LDL 70mg , HDL >40mg .    Expected Outcomes Short Term: Participant states understanding of desired cholesterol values and is compliant with medications prescribed. Participant is following exercise prescription and nutrition guidelines.;Long Term: Cholesterol controlled with medications as prescribed, with individualized exercise RX and with personalized nutrition plan. Value goals: LDL < 70mg , HDL > 40 mg.    Stress Yes    Intervention Offer  individual and/or small group education and counseling on adjustment to heart disease, stress management and health-related lifestyle change. Teach and support self-help strategies.;Refer participants experiencing significant psychosocial distress to appropriate mental health specialists for further evaluation and treatment. When possible, include family members and significant others in education/counseling sessions.    Expected Outcomes Long Term: Emotional wellbeing is indicated by absence of clinically significant psychosocial distress or social isolation.;Short Term: Participant demonstrates changes in health-related behavior, relaxation and other stress management skills, ability to obtain effective social support, and compliance with psychotropic medications if prescribed.          Core Components/Risk Factors/Patient Goals Review:    Core Components/Risk Factors/Patient Goals at Discharge (Final Review):  ITP Comments:  ITP Comments     Row Name 02/16/24 1316           ITP Comments Dr. Wilbert Bihari medical director. Introduction to [facility] cardiac rehab. Initial orientation packet reviewed with patient          Comments: Participant attended orientation for the cardiac rehabilitation program on  02/16/2024  to perform initial intake and exercise walk test. Patient introduced to the [facility] education and orientation packet was reviewed. Completed 6-minute walk test, measurements, initial ITP, and exercise prescription. Vital signs stable. Telemetry-normal sinus rhythm, asymptomatic.   Service time was from 1311 to 1530.     Con KATHEE Pereyra, MS, ACSM-CEP 02/16/2024 4:03 PM

## 2024-02-20 ENCOUNTER — Encounter (HOSPITAL_COMMUNITY)
Admission: RE | Admit: 2024-02-20 | Discharge: 2024-02-20 | Disposition: A | Discharge status: Home / Self Care | Source: Ambulatory Visit | Attending: Cardiovascular Disease

## 2024-02-20 DIAGNOSIS — Z48812 Encounter for surgical aftercare following surgery on the circulatory system: Secondary | ICD-10-CM | POA: Diagnosis not present

## 2024-02-20 DIAGNOSIS — Z955 Presence of coronary angioplasty implant and graft: Secondary | ICD-10-CM

## 2024-02-20 DIAGNOSIS — I214 Non-ST elevation (NSTEMI) myocardial infarction: Secondary | ICD-10-CM

## 2024-02-20 LAB — GLUCOSE, CAPILLARY: Glucose-Capillary: 146 mg/dL — ABNORMAL HIGH (ref 70–99)

## 2024-02-20 NOTE — Progress Notes (Signed)
 Daily Session Note  Patient Details  Name: [name] MRN: [id number] Date of Birth: 01-06-1935 Referring Provider:   Flowsheet Row INTENSIVE CARDIAC REHAB ORIENT from 02/16/2024 in [facility]  Referring Provider Calvin Fell, MD    Encounter Date: 02/20/2024  Check In:  Session Check In - 02/20/24 1446       Check-In   Supervising physician immediately available to respond to emergencies [facility] MD immediately available    Physician(s) Calvin Louis, NP    Location [facility]    Staff Present Calvin Quan, RN, Calvin Pereyra, MS, Exercise Physiologist;Calvin Valere, MS, ACSM-CEP, Exercise Physiologist;[name] BS, ACSM-CEP, Exercise Physiologist;Calvin Lennon, RN, Calvin Parkins, MS, ACSM-CEP, CCRP, Exercise Physiologist    Virtual Visit No    Medication changes reported     No    Fall or balance concerns reported    No    Tobacco Cessation No Change    Warm-up and Cool-down Performed as group-led instruction    Resistance Training Performed Yes    VAD Patient? No    PAD/SET Patient? No      Pain Assessment   Currently in Pain? No/denies    Pain Score 0-No pain    Multiple Pain Sites No          Capillary Blood Glucose: Results for orders placed or performed during the hospital encounter of 02/20/24 (from the past 24 hours)  Glucose, capillary     Status: Abnormal   Collection Time: 02/20/24  3:59 PM  Result Value Ref Range   Glucose-Capillary 146 (H) 70 - 99 mg/dL     Exercise Prescription Changes - 02/20/24 1644       Response to Exercise   Blood Pressure (Admit) 142/80    Blood Pressure (Exercise) 142/90    Blood Pressure (Exit) 124/76    Heart Rate (Admit) 69 bpm    Heart Rate (Exercise) 95 bpm    Heart Rate (Exit) 73 bpm    Rating of Perceived Exertion (Exercise) 9    Perceived Dyspnea (Exercise) 0    Symptoms 0    Comments Pt first day in the [facility]  program    Duration Progress to 30 minutes of  aerobic without signs/symptoms of physical distress    Intensity THRR unchanged      Progression   Progression Continue to progress workloads to maintain intensity without signs/symptoms of physical distress.    Average METs 1.4      Resistance Training   Training Prescription Yes    Weight 2    Reps 10-15    Time 10 Minutes      NuStep   Level 1    SPM 64    Minutes 25    METs 1.4          Social History   Tobacco Use  Smoking Status Former   Types: Pipe   Quit date: 07/05/1984   Years since quitting: 39.6  Smokeless Tobacco Never    Goals Met:  Exercise tolerated well No report of concerns or symptoms today Strength training completed today  Goals Unmet:  Not Applicable  Comments: Pt started cardiac rehab today.  Pt tolerated light exercise without difficulty. [name] is deconditioned and uses a cane for stability. VSS, telemetry-Sinus Rhythm, bundle branch block, asymptomatic.  Medication list reconciled. Pt denies barriers to medicaiton compliance.  PSYCHOSOCIAL ASSESSMENT:  PHQ-11. Admits to being depressed. Has talked to PCP about depression.  [name] lives alone he runs and air [employer] and [employer]. [name] says he does not have immediate support in the area but does have contact with his ex wife who lives in the area.  Pt enjoys movies listening to classical music and watching [employer]..   Pt oriented to exercise equipment and routine.    Understanding verbalized. Calvin Elpidio Quan RN BSN    Dr. Wilbert Bihari is Medical Director for Cardiac Rehab at [facility].

## 2024-02-21 LAB — GLUCOSE, CAPILLARY: Glucose-Capillary: 218 mg/dL — ABNORMAL HIGH (ref 70–99)

## 2024-02-22 ENCOUNTER — Encounter (HOSPITAL_COMMUNITY)
Admission: RE | Admit: 2024-02-22 | Discharge: 2024-02-22 | Disposition: A | Discharge status: Home / Self Care | Source: Ambulatory Visit | Attending: Cardiovascular Disease | Admitting: Cardiovascular Disease

## 2024-02-22 DIAGNOSIS — Z955 Presence of coronary angioplasty implant and graft: Secondary | ICD-10-CM

## 2024-02-22 DIAGNOSIS — I214 Non-ST elevation (NSTEMI) myocardial infarction: Secondary | ICD-10-CM

## 2024-02-22 DIAGNOSIS — Z48812 Encounter for surgical aftercare following surgery on the circulatory system: Secondary | ICD-10-CM | POA: Diagnosis not present

## 2024-02-22 LAB — GLUCOSE, CAPILLARY
Glucose-Capillary: 165 mg/dL — ABNORMAL HIGH (ref 70–99)
Glucose-Capillary: 133 mg/dL — ABNORMAL HIGH (ref 70–99)

## 2024-02-24 ENCOUNTER — Encounter (HOSPITAL_COMMUNITY)
Admission: RE | Admit: 2024-02-24 | Discharge: 2024-02-24 | Disposition: A | Discharge status: Home / Self Care | Source: Ambulatory Visit | Attending: Cardiovascular Disease | Admitting: Cardiovascular Disease

## 2024-02-24 DIAGNOSIS — Z955 Presence of coronary angioplasty implant and graft: Secondary | ICD-10-CM

## 2024-02-24 DIAGNOSIS — Z48812 Encounter for surgical aftercare following surgery on the circulatory system: Secondary | ICD-10-CM | POA: Diagnosis not present

## 2024-02-24 DIAGNOSIS — I214 Non-ST elevation (NSTEMI) myocardial infarction: Secondary | ICD-10-CM

## 2024-02-24 NOTE — Progress Notes (Addendum)
 Pt noted to have a 24BRVT while staff was taking his post-exercise BP. Pt stated I felt off and warm when you were checking my BP.  BP 92/68 initially. Upon recheck 128/62.  No further runs of VT noted on telemetry while pt remained at cardiac rehab.  Provider made aware, examined the pt, and cleared pt to go home.  Re-educated pt that he needs to be taking 1 tablet of carvedilol  3.125mg  twice a day as there was some question as to his compliance with it as prescribed.  Provider also educated pt that if he has any further symptoms of a racing heart rate that he needs to call 911 immediately.

## 2024-02-27 ENCOUNTER — Encounter (HOSPITAL_COMMUNITY)
Admission: RE | Admit: 2024-02-27 | Discharge: 2024-02-27 | Disposition: A | Discharge status: Home / Self Care | Source: Ambulatory Visit | Attending: Cardiovascular Disease

## 2024-02-27 DIAGNOSIS — I214 Non-ST elevation (NSTEMI) myocardial infarction: Secondary | ICD-10-CM

## 2024-02-27 DIAGNOSIS — Z48812 Encounter for surgical aftercare following surgery on the circulatory system: Secondary | ICD-10-CM | POA: Diagnosis not present

## 2024-02-27 DIAGNOSIS — Z955 Presence of coronary angioplasty implant and graft: Secondary | ICD-10-CM

## 2024-02-29 ENCOUNTER — Encounter (HOSPITAL_COMMUNITY)
Admission: RE | Admit: 2024-02-29 | Discharge: 2024-02-29 | Disposition: A | Discharge status: Home / Self Care | Source: Ambulatory Visit | Attending: Cardiovascular Disease

## 2024-02-29 DIAGNOSIS — I214 Non-ST elevation (NSTEMI) myocardial infarction: Secondary | ICD-10-CM

## 2024-02-29 DIAGNOSIS — Z48812 Encounter for surgical aftercare following surgery on the circulatory system: Secondary | ICD-10-CM | POA: Diagnosis not present

## 2024-02-29 DIAGNOSIS — Z955 Presence of coronary angioplasty implant and graft: Secondary | ICD-10-CM

## 2024-03-02 ENCOUNTER — Encounter (HOSPITAL_COMMUNITY)
Admission: RE | Admit: 2024-03-02 | Discharge: 2024-03-02 | Disposition: A | Discharge status: Home / Self Care | Source: Ambulatory Visit | Attending: Cardiovascular Disease | Admitting: Cardiovascular Disease

## 2024-03-02 DIAGNOSIS — I214 Non-ST elevation (NSTEMI) myocardial infarction: Secondary | ICD-10-CM

## 2024-03-02 DIAGNOSIS — Z955 Presence of coronary angioplasty implant and graft: Secondary | ICD-10-CM

## 2024-03-02 DIAGNOSIS — Z48812 Encounter for surgical aftercare following surgery on the circulatory system: Secondary | ICD-10-CM | POA: Diagnosis not present

## 2024-03-07 ENCOUNTER — Encounter (HOSPITAL_COMMUNITY)
Admission: RE | Admit: 2024-03-07 | Discharge: 2024-03-07 | Disposition: A | Discharge status: Home / Self Care | Source: Ambulatory Visit | Attending: Cardiovascular Disease | Admitting: Cardiovascular Disease

## 2024-03-07 DIAGNOSIS — I214 Non-ST elevation (NSTEMI) myocardial infarction: Secondary | ICD-10-CM | POA: Diagnosis present

## 2024-03-07 DIAGNOSIS — Z955 Presence of coronary angioplasty implant and graft: Secondary | ICD-10-CM | POA: Insufficient documentation

## 2024-03-09 ENCOUNTER — Encounter (HOSPITAL_COMMUNITY)

## 2024-03-12 ENCOUNTER — Encounter (HOSPITAL_COMMUNITY)
Admission: RE | Admit: 2024-03-12 | Discharge: 2024-03-12 | Disposition: A | Discharge status: Home / Self Care | Source: Ambulatory Visit | Attending: Cardiovascular Disease

## 2024-03-12 DIAGNOSIS — I214 Non-ST elevation (NSTEMI) myocardial infarction: Secondary | ICD-10-CM

## 2024-03-12 DIAGNOSIS — Z955 Presence of coronary angioplasty implant and graft: Secondary | ICD-10-CM

## 2024-03-13 NOTE — Progress Notes (Signed)
 Cardiac Individual Treatment Plan  Patient Details  Name: [name] MRN: [id number] Date of Birth: Nov 01, 1934 Referring Provider:   Flowsheet Row INTENSIVE CARDIAC REHAB ORIENT from 02/16/2024 in [facility] for Heart, Vascular, & Lung Health  Referring Provider Ozell Fell, MD    Initial Encounter Date:  Flowsheet Row INTENSIVE CARDIAC REHAB ORIENT from 02/16/2024 in [facility] for Heart, Vascular, & Lung Health  Date 02/16/24    Visit Diagnosis: NSTEMI (non-ST elevated myocardial infarction) [facility])  Status post coronary artery stent placement  Patient's Home Medications on Admission:  Current Outpatient Medications:    amoxicillin  (AMOXIL ) 500 MG tablet, Take 4 tablets (2,000 mg total) by mouth as directed. 1 hour prior to dental work including cleanings, Disp: 12 tablet, Rfl: 12   apixaban  (ELIQUIS ) 5 MG TABS tablet, Take 1 tablet (5 mg total) by mouth 2 (two) times daily., Disp: 180 tablet, Rfl: 2   ascorbic acid (VITAMIN C) 500 MG tablet, Take 500 mg by mouth daily., Disp: , Rfl:    Carboxymethylcellulose Sodium (LUBRICANT EYE DROPS OP), Place 1 drop into both eyes daily as needed (dry eyes)., Disp: , Rfl:    carvedilol  (COREG ) 3.125 MG tablet, Take 1 tablet (3.125 mg total) by mouth 2 (two) times daily with a meal., Disp: 180 tablet, Rfl: 3   Cholecalciferol (VITAMIN D) 50 MCG (2000 UT) tablet, Take 2,000 Units by mouth daily., Disp: , Rfl:    Cyanocobalamin (B-12) 2500 MCG TABS, Take 2,500 mcg by mouth daily., Disp: , Rfl:    furosemide  (LASIX ) 20 MG tablet, Take 2 tablets (40 mg total) by mouth daily., Disp: 90 tablet, Rfl: 3   sacubitril -valsartan  (ENTRESTO ) 24-26 MG, Take 1 tablet by mouth 2 (two) times daily., Disp: 180 tablet, Rfl: 2  Past Medical History: Past Medical History:  Diagnosis Date   (HFimpEF) heart failure with improved EF    Echo 04/2019: EF 35-40 // Echo 5/22: EF 55-60, no RWMA, GR 1 DD, moderate to  severe MR, myxomatous mitral valve, similar to prior echocardiogram, mildly elevated PASP, RVSP 35.9, mild AI, mild AV sclerosis without AS, mild dilation of ascending aorta (43 mm)   A-fib (HCC)    RVR-DCCV   Abnormal PSA 01/17/2012   10.38/4/5/ PSA:2013=6.96/ PSA 07/08/2011=6.26/ PSA 2006=8.6   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/16/2021   Echocardiogram March 2023: 41 mm   Blood transfusion without reported diagnosis    during vagotomy 1976   CAD (coronary artery disease)    Chronic kidney disease    nephrolithiasis right kidney   Coronary artery disease    ED (erectile dysfunction)    GERD (gastroesophageal reflux disease)    PUD S/P PARTIAL VAGOTOMY   Gynecomastia 04/21/2011   seen by Dr.Murray, no rad tx   H/O impacted cerumen    S/P ENT DR BATES   History of echocardiogram    echo 4/16:  mild LVH, EF 55-60%, Gr 1 DD, no RWMA, mild AI, mild MR, mod LAE, mild TR   Hydroureteronephrosis    right    Hyperlipidemia    Hypertension    Insomnia    Migraines    WITH AURA   Nephrolithiasis    Osteopenia    Overweight    Prostate cancer (HCC) 12/09/03 dx   Prostate ca/adenocarcinoma,gleason=3+3=6    pSA 4.6   RBBB (right bundle branch block)    Rosacea    S/P mitral valve clip implantation 12/22/2023   s/p Mitraclip NTW x 2  with a reduction from 3-4+ MR to 1 + residual MR.   Sinus bradycardia    Sleep apnea    pt unaware, no sleep study   Stroke [facility])    asymptomatic, found on head imaging    Tobacco Use: Social History   Tobacco Use  Smoking Status Former   Types: Pipe   Quit date: 07/05/1984   Years since quitting: 39.7  Smokeless Tobacco Never    Labs: Review Flowsheet  More data exists      Latest Ref Rng & Units 04/25/2019 11/15/2022 08/30/2023 11/14/2023 12/21/2023  Labs for ITP Cardiac and Pulmonary Rehab  Cholestrol 100 - 199 mg/dL - 786  - - -  LDL (calc) 0 - 99 mg/dL - 866  - - -  HDL-C >60 mg/dL - 42  - - -  Trlycerides 0 - 149 mg/dL - 784  - - -   Hemoglobin A1c 4.8 - 5.6 % - - 7.4  - -  PH, Arterial 7.35 - 7.45 7.411  - - 7.408  7.276   PCO2 arterial 32 - 48 mmHg 46.0  - - 38.7  53.4   Bicarbonate 20.0 - 28.0 mmol/L 32.0  29.5  29.2  - - 27.2  24.4  24.9   TCO2 22 - 32 mmol/L 34  31  31  - 23  29  26  26    Acid-base deficit 0.0 - 2.0 mmol/L - - - - 3.0   O2 Saturation % 76.0  77.0  98.0  - - 62  94  98     Details       Multiple values from one day are sorted in reverse-chronological order         Capillary Blood Glucose: Lab Results  Component Value Date   GLUCAP 133 (H) 02/22/2024   GLUCAP 165 (H) 02/22/2024   GLUCAP 146 (H) 02/20/2024   GLUCAP 218 (H) 02/20/2024   GLUCAP 158 (H) 12/21/2023     Exercise Target Goals: Exercise Program Goal: Individual exercise prescription set using results from initial 6 min walk test and THRR while considering  patient's activity barriers and safety.   Exercise Prescription Goal: Initial exercise prescription builds to 30-45 minutes a day of aerobic activity, 2-3 days per week.  Home exercise guidelines will be given to patient during program as part of exercise prescription that the participant will acknowledge.  Activity Barriers & Risk Stratification:  Activity Barriers & Cardiac Risk Stratification - 02/16/24 1559       Activity Barriers & Cardiac Risk Stratification   Activity Barriers Shortness of Breath;Balance Concerns;History of Falls;Joint Problems;Deconditioning    Cardiac Risk Stratification High   <5 METs on         6 Minute Walk:  6 Minute Walk     Row Name 02/16/24 1315         6 Minute Walk   Phase Initial     Distance 840 feet     Walk Time 6 minutes     # of Rest Breaks 0     MPH 16     METS 1.23     RPE 11     Perceived Dyspnea  0     VO2 Peak 4.3     Symptoms No     Resting HR 68 bpm     Resting BP 142/82     Resting Oxygen Saturation  96 %     Exercise Oxygen Saturation  during 6 min walk  97 %     Max Ex. HR 100 bpm     Max  Ex. BP 160/80     2 Minute Post BP 142/84        Oxygen Initial Assessment:   Oxygen Re-Evaluation:   Oxygen Discharge (Final Oxygen Re-Evaluation):   Initial Exercise Prescription:  Initial Exercise Prescription - 02/16/24 1500       Date of Initial Exercise RX and Referring Provider   Date 02/16/24    Referring Provider Ozell Fell, MD    Expected Discharge Date 05/09/24      NuStep   Level 1    SPM 60    Minutes 25    METs 1.9      Prescription Details   Frequency (times per week) 3    Duration Progress to 30 minutes of continuous aerobic without signs/symptoms of physical distress      Intensity   THRR 40-80% of Max Heartrate 54-107    Ratings of Perceived Exertion 11-13    Perceived Dyspnea 0-4      Progression   Progression Continue progressive overload as per policy without signs/symptoms or physical distress.      Resistance Training   Training Prescription Yes    Weight 2    Reps 10-15          Perform Capillary Blood Glucose checks as needed.  Exercise Prescription Changes:   Exercise Prescription Changes     Row Name 02/20/24 1644 03/07/24 1630           Response to Exercise   Blood Pressure (Admit) 142/80 140/70      Blood Pressure (Exercise) 142/90 130/70      Blood Pressure (Exit) 124/76 124/78      Heart Rate (Admit) 69 bpm 51 bpm      Heart Rate (Exercise) 95 bpm 73 bpm      Heart Rate (Exit) 73 bpm 60 bpm      Rating of Perceived Exertion (Exercise) 9 9      Perceived Dyspnea (Exercise) 0 0      Symptoms 0 0      Comments Pt first day in the [facility] ICR program Reviewed MET's and goals      Duration Progress to 30 minutes of  aerobic without signs/symptoms of physical distress Progress to 30 minutes of  aerobic without signs/symptoms of physical distress      Intensity THRR unchanged THRR unchanged        Progression   Progression Continue to progress workloads to maintain intensity without signs/symptoms of physical  distress. Continue to progress workloads to maintain intensity without signs/symptoms of physical distress.      Average METs 1.4 1.7        Resistance Training   Training Prescription Yes No      Weight 2 2      Reps 10-15 10-15      Time 10 Minutes 10 Minutes        NuStep   Level 1 1      SPM 64 72      Minutes 25 30      METs 1.4 1.7         Exercise Comments:   Exercise Comments     Row Name 02/20/24 1648 03/08/24 0936         Exercise Comments Pt first day in the [facility] ICR program. Pt tolerated exercise well with an average MET level of 1.4. He is off to a  good start and is learning his THRR, RPE and ExRx REVD MET's and goals         Exercise Goals and Review:   Exercise Goals     Row Name 02/16/24 1600             Exercise Goals   Increase Physical Activity Yes       Intervention Develop an individualized exercise prescription for aerobic and resistive training based on initial evaluation findings, risk stratification, comorbidities and participant's personal goals.;Provide advice, education, support and counseling about physical activity/exercise needs.       Expected Outcomes Long Term: Exercising regularly at least 3-5 days a week.;Short Term: Attend rehab on a regular basis to increase amount of physical activity.;Long Term: Add in home exercise to make exercise part of routine and to increase amount of physical activity.       Increase Strength and Stamina Yes       Intervention Provide advice, education, support and counseling about physical activity/exercise needs.;Develop an individualized exercise prescription for aerobic and resistive training based on initial evaluation findings, risk stratification, comorbidities and participant's personal goals.       Expected Outcomes Short Term: Increase workloads from initial exercise prescription for resistance, speed, and METs.;Short Term: Perform resistance training exercises routinely during rehab and add in  resistance training at home;Long Term: Improve cardiorespiratory fitness, muscular endurance and strength as measured by increased METs and functional capacity ( )       Able to understand and use rate of perceived exertion (RPE) scale Yes       Intervention Provide education and explanation on how to use RPE scale       Expected Outcomes Short Term: Able to use RPE daily in rehab to express subjective intensity level;Long Term:  Able to use RPE to guide intensity level when exercising independently       Knowledge and understanding of Target Heart Rate Range (THRR) Yes       Intervention Provide education and explanation of THRR including how the numbers were predicted and where they are located for reference       Expected Outcomes Short Term: Able to state/look up THRR;Long Term: Able to use THRR to govern intensity when exercising independently;Short Term: Able to use daily as guideline for intensity in rehab       Understanding of Exercise Prescription Yes       Intervention Provide education, explanation, and written materials on patient's individual exercise prescription       Expected Outcomes Short Term: Able to explain program exercise prescription;Long Term: Able to explain home exercise prescription to exercise independently          Exercise Goals Re-Evaluation :  Exercise Goals Re-Evaluation     Row Name 02/20/24 1647 03/08/24 0934           Exercise Goal Re-Evaluation   Exercise Goals Review Increase Physical Activity;Understanding of Exercise Prescription;Increase Strength and Stamina;Knowledge and understanding of Target Heart Rate Range (THRR);Able to understand and use rate of perceived exertion (RPE) scale Increase Physical Activity;Understanding of Exercise Prescription;Increase Strength and Stamina;Knowledge and understanding of Target Heart Rate Range (THRR);Able to understand and use rate of perceived exertion (RPE) scale      Comments Pt first day in the [facility]  ICR program. Pt tolerated exercise well with an average MET level of 1.4.  He is off to a good start and is learning his THRR, RPE and ExRx Reviewed MET's and goals. Pt tolerated exercise  well with an average MET level of 1.7.  Patient is doing well and making gradual progression, talked about a WL increase next week. So far, he does not feel a change in his goals, but encouraged we are early on and he has increased his time from 25 to 30 mins and increased MET's. Will work on gradual progression overtime      Expected Outcomes Will continue to monitor pt and progress workloads as tolerated without sign or symptom Will continue to monitor pt and progress workloads as tolerated without sign or symptom         Discharge Exercise Prescription (Final Exercise Prescription Changes):  Exercise Prescription Changes - 03/07/24 1630       Response to Exercise   Blood Pressure (Admit) 140/70    Blood Pressure (Exercise) 130/70    Blood Pressure (Exit) 124/78    Heart Rate (Admit) 51 bpm    Heart Rate (Exercise) 73 bpm    Heart Rate (Exit) 60 bpm    Rating of Perceived Exertion (Exercise) 9    Perceived Dyspnea (Exercise) 0    Symptoms 0    Comments Reviewed MET's and goals    Duration Progress to 30 minutes of  aerobic without signs/symptoms of physical distress    Intensity THRR unchanged      Progression   Progression Continue to progress workloads to maintain intensity without signs/symptoms of physical distress.    Average METs 1.7      Resistance Training   Training Prescription No    Weight 2    Reps 10-15    Time 10 Minutes      NuStep   Level 1    SPM 72    Minutes 30    METs 1.7          Nutrition:  Target Goals: Understanding of nutrition guidelines, daily intake of sodium 1500mg , cholesterol 200mg , calories 30% from fat and 7% or less from saturated fats, daily to have 5 or more servings of fruits and vegetables.  Biometrics:  Pre Biometrics - 02/16/24 1400        Pre Biometrics   Waist Circumference 45 inches    Hip Circumference 47 inches    Waist to Hip Ratio 0.96 %    Triceps Skinfold 26 mm    % Body Fat 33.9 %    Grip Strength 22 kg    Flexibility 0 in    Single Leg Stand 1 seconds           Nutrition Therapy Plan and Nutrition Goals:  Nutrition Therapy & Goals - 02/20/24 1542       Nutrition Therapy   Diet Heart Healthy Diet      Personal Nutrition Goals   Nutrition Goal Patient to identify strategies for reducing cardiovascular risk by attending the [facility] education and nutrition series weekly.    Personal Goal #2 Patient to improve diet quality by using the plate method as a guide for meal planning to include lean protein/plant protein, fruits, vegetables, whole grains, nonfat dairy as part of a well-balanced diet.    Comments Patient has medical history of RBBB, hyperlipidemia, PAH, HTN, Afib, CHF, Severe MR s/p MTEER, CKD3. A1c is in a diabetic range. Lipids from 2024 and have not been re-checked since starting zetia  08/2023; previously they were not at goal. Patient will benefit from participation in intensive cardiac rehab for nutrition education, exercise, and lifestyle modification.      Intervention Plan   Intervention Prescribe,  educate and counsel regarding individualized specific dietary modifications aiming towards targeted core components such as weight, hypertension, lipid management, diabetes, heart failure and other comorbidities.;Nutrition handout(s) given to patient.    Expected Outcomes Short Term Goal: Understand basic principles of dietary content, such as calories, fat, sodium, cholesterol and nutrients.;Long Term Goal: Adherence to prescribed nutrition plan.          Nutrition Assessments:  Nutrition Assessments - 02/27/24 1705       Rate Your Plate Scores   Pre Score 69         MEDIFICTS Score Key: >=70 Need to make dietary changes  40-70 Heart Healthy Diet <= 40 Therapeutic Level Cholesterol  Diet   Flowsheet Row INTENSIVE CARDIAC REHAB from 02/27/2024 in [facility] for Heart, Vascular, & Lung Health  Picture Your Plate Total Score on Admission 69   Picture Your Plate Scores: <59 Unhealthy dietary pattern with much room for improvement. 41-50 Dietary pattern unlikely to meet recommendations for good health and room for improvement. 51-60 More healthful dietary pattern, with some room for improvement.  >60 Healthy dietary pattern, although there may be some specific behaviors that could be improved.    Nutrition Goals Re-Evaluation:  Nutrition Goals Re-Evaluation     Row Name 02/20/24 1542             Goals   Current Weight 235 lb 3.7 oz (106.7 kg)       Comment A1c 7.4, cholesterol 213, triglycerides 215, LDL 133       Expected Outcome Patient has medical history of RBBB, hyperlipidemia, PAH, HTN, Afib, CHF, Severe MR s/p MTEER, CKD3. A1c is in a diabetic range. Lipids from 2024 and have not been re-checked since starting zetia  08/2023; previously they were not at goal. Patient will benefit from participation in intensive cardiac rehab for nutrition education, exercise, and lifestyle modification.          Nutrition Goals Re-Evaluation:  Nutrition Goals Re-Evaluation     Row Name 02/20/24 1542             Goals   Current Weight 235 lb 3.7 oz (106.7 kg)       Comment A1c 7.4, cholesterol 213, triglycerides 215, LDL 133       Expected Outcome Patient has medical history of RBBB, hyperlipidemia, PAH, HTN, Afib, CHF, Severe MR s/p MTEER, CKD3. A1c is in a diabetic range. Lipids from 2024 and have not been re-checked since starting zetia  08/2023; previously they were not at goal. Patient will benefit from participation in intensive cardiac rehab for nutrition education, exercise, and lifestyle modification.          Nutrition Goals Discharge (Final Nutrition Goals Re-Evaluation):  Nutrition Goals Re-Evaluation - 02/20/24 1542       Goals    Current Weight 235 lb 3.7 oz (106.7 kg)    Comment A1c 7.4, cholesterol 213, triglycerides 215, LDL 133    Expected Outcome Patient has medical history of RBBB, hyperlipidemia, PAH, HTN, Afib, CHF, Severe MR s/p MTEER, CKD3. A1c is in a diabetic range. Lipids from 2024 and have not been re-checked since starting zetia  08/2023; previously they were not at goal. Patient will benefit from participation in intensive cardiac rehab for nutrition education, exercise, and lifestyle modification.          Psychosocial: Target Goals: Acknowledge presence or absence of significant depression and/or stress, maximize coping skills, provide positive support system. Participant is able to verbalize types and ability  to use techniques and skills needed for reducing stress and depression.  Initial Review & Psychosocial Screening:  Initial Psych Review & Screening - 02/16/24 1504       Initial Review   Current issues with Current Sleep Concerns;Current Stress Concerns;Current Depression    Source of Stress Concerns Unable to perform yard/household activities;Unable to participate in former interests or hobbies    Comments Aleksandr says he sometimes feels depressed. Dominque is not on an antidepressant and is not interested in counseling at this time.  Elan says he does not sleep well at night due to frequent urination. Discussed sleep hygiene. Recommended trying chamomile tea. Rc says he has tried melatonin in the past without sucess. I encouraged Capers to discuss with his PCP.      Family Dynamics   Good Support System? Yes   Zacharey lives alone. Anais's ex wife lives in the area. Yuya has 2 daughters one lives in [place] and was affected by [place] the other daughter lives in [place].     Barriers   Psychosocial barriers to participate in program The patient should benefit from training in stress management and relaxation.      Screening Interventions   Interventions Encouraged to exercise;Provide feedback  about the scores to participant    Expected Outcomes Long Term Goal: Stressors or current issues are controlled or eliminated.;Short Term goal: Identification and review with participant of any Quality of Life or Depression concerns found by scoring the questionnaire.;Long Term goal: The participant improves quality of Life and PHQ9 Scores as seen by post scores and/or verbalization of changes          Quality of Life Scores:  Quality of Life - 02/16/24 1558       Quality of Life   Select Quality of Life      Quality of Life Scores   Health/Function Pre 22.29 %    Socioeconomic Pre 24.43 %    Psych/Spiritual Pre 22.5 %    Family Pre 27 %    GLOBAL Pre 22.95 %         Scores of 19 and below usually indicate a poorer quality of life in these areas.  A difference of  2-3 points is a clinically meaningful difference.  A difference of 2-3 points in the total score of the Quality of Life Index has been associated with significant improvement in overall quality of life, self-image, physical symptoms, and general health in studies assessing change in quality of life.  PHQ-9: Review Flowsheet       02/16/2024  Depression screen PHQ 2/9  Decreased Interest 2  Down, Depressed, Hopeless 2  PHQ - 2 Score 4  Altered sleeping 3  Tired, decreased energy 3  Change in appetite 1  Feeling bad or failure about yourself  0  Trouble concentrating 0  Moving slowly or fidgety/restless 0  Suicidal thoughts 0  PHQ-9 Score 11  Difficult doing work/chores Not difficult at all   Interpretation of Total Score  Total Score Depression Severity:  1-4 = Minimal depression, 5-9 = Mild depression, 10-14 = Moderate depression, 15-19 = Moderately severe depression, 20-27 = Severe depression   Psychosocial Evaluation and Intervention:   Psychosocial Re-Evaluation:  Psychosocial Re-Evaluation     Row Name 03/13/24 1538             Psychosocial Re-Evaluation   Current issues with Current Stress  Concerns;History of Depression;Current Sleep Concerns       Comments Reviewed PHQ9. Nohlan admits to  bieng depressed. Zaydan is not taking any antidepressants at this time. Shahrukh says he has difficulty sleeping and has talked with his primary care provider Dr Onita regarding this. Makoa says he does not feel like cooking meals at times.       Expected Outcomes Clarkson will have controlled or decreased depression/ stressors  upon completion of cardiac rehab       Interventions Stress management education;Relaxation education;Encouraged to attend Cardiac Rehabilitation for the exercise       Continue Psychosocial Services  Follow up required by staff         Initial Review   Source of Stress Concerns Occupation;Family;Financial       Comments will continue to monitor and offer support as needed.          Psychosocial Discharge (Final Psychosocial Re-Evaluation):  Psychosocial Re-Evaluation - 03/13/24 1538       Psychosocial Re-Evaluation   Current issues with Current Stress Concerns;History of Depression;Current Sleep Concerns    Comments Reviewed PHQ9. Perrin admits to bieng depressed. Javohn is not taking any antidepressants at this time. Wendy says he has difficulty sleeping and has talked with his primary care provider Dr Onita regarding this. Lyndell says he does not feel like cooking meals at times.    Expected Outcomes Iley will have controlled or decreased depression/ stressors  upon completion of cardiac rehab    Interventions Stress management education;Relaxation education;Encouraged to attend Cardiac Rehabilitation for the exercise    Continue Psychosocial Services  Follow up required by staff      Initial Review   Source of Stress Concerns Occupation;Family;Financial    Comments will continue to monitor and offer support as needed.          Vocational Rehabilitation: Provide vocational rehab assistance to qualifying candidates.   Vocational Rehab Evaluation & Intervention:   Vocational Rehab - 02/16/24 1519       Initial Vocational Rehab Evaluation & Intervention   Assessment shows need for Vocational Rehabilitation No   Norberto runs a air [employer]. Manus does not need vocational rehab at this time.         Education: Education Goals: Education classes will be provided on a weekly basis, covering required topics. Participant will state understanding/return demonstration of topics presented.    Education     Row Name 02/20/24 1400     Education   Cardiac Education Topics [facility]   Select Workshops     Workshops   Educator Exercise Physiologist   Select Psychosocial   Psychosocial Workshop Recognizing and Reducing Stress   Instruction Review Code 1- Verbalizes Understanding   Class Start Time 1405   Class Stop Time 1448   Class Time Calculation (min) 43 min    Row Name 02/22/24 1400     Education   Cardiac Education Topics [facility]   Customer service manager   Weekly Topic [employer] Desserts   Instruction Review Code 1- Verbalizes Understanding   Class Start Time 1400   Class Stop Time 1450   Class Time Calculation (min) 50 min    Row Name 02/24/24 1500     Education   Cardiac Education Topics Pritikin   Psychologist, counselling   Select Nutrition   Nutrition Calorie Density   Instruction Review Code 1- Verbalizes Understanding   Class Start Time 1400   Class Stop Time 1435  Class Time Calculation (min) 35 min    Row Name 02/27/24 1400     Education   Cardiac Education Topics Pritikin   Geographical information systems officer Exercise   Exercise Workshop Exercise Basics: Diplomatic Services operational officer   Instruction Review Code 1- Verbalizes Understanding   Class Start Time 1420   Class Stop Time 1500   Class Time Calculation (min) 40 min    Row Name 02/29/24 1400     Education   Cardiac Education Topics Pritikin    Customer service manager   Weekly Topic Efficiency Cooking - Meals in a Snap   Instruction Review Code 1- Verbalizes Understanding   Class Start Time 1400   Class Stop Time 1440   Class Time Calculation (min) 40 min    Row Name 03/02/24 1400     Education   Cardiac Education Topics Pritikin   Psychologist, forensic Exercise Education   Exercise Education Move It!   Instruction Review Code 1- Verbalizes Understanding   Class Start Time 1359   Class Stop Time 1432   Class Time Calculation (min) 33 min    Row Name 03/07/24 1400     Education   Cardiac Education Topics Pritikin   Customer service manager   Weekly Topic One-Pot Wonders   Instruction Review Code 1- Verbalizes Understanding   Class Start Time 1400   Class Stop Time 1440   Class Time Calculation (min) 40 min    Row Name 03/12/24 1400     Education   Cardiac Education Topics Pritikin   Geographical information systems officer Psychosocial   Psychosocial Workshop Focused Goals, Sustainable Changes   Instruction Review Code 1- Verbalizes Understanding   Class Start Time 1359   Class Stop Time 1434   Class Time Calculation (min) 35 min      Core Videos: Exercise    Move It!  Clinical staff conducted group or individual video education with verbal and written material and guidebook.  Patient learns the recommended Pritikin exercise program. Exercise with the goal of living a long, healthy life. Some of the health benefits of exercise include controlled diabetes, healthier blood pressure levels, improved cholesterol levels, improved heart and lung capacity, improved sleep, and better body composition. Everyone should speak with their doctor before starting or changing an exercise routine.  Biomechanical Limitations Clinical staff conducted  group or individual video education with verbal and written material and guidebook.  Patient learns how biomechanical limitations can impact exercise and how we can mitigate and possibly overcome limitations to have an impactful and balanced exercise routine.  Body Composition Clinical staff conducted group or individual video education with verbal and written material and guidebook.  Patient learns that body composition (ratio of muscle mass to fat mass) is a key component to assessing overall fitness, rather than body weight alone. Increased fat mass, especially visceral belly fat, can put us  at increased risk for metabolic syndrome, type 2 diabetes, heart disease, and even death. It is recommended to combine diet and exercise (cardiovascular and resistance training) to improve your body composition. Seek guidance from your physician and exercise physiologist before implementing an exercise routine.  Exercise Action Plan  Clinical staff conducted group or individual video education with verbal and written material and guidebook.  Patient learns the recommended strategies to achieve and enjoy long-term exercise adherence, including variety, self-motivation, self-efficacy, and positive decision making. Benefits of exercise include fitness, good health, weight management, more energy, better sleep, less stress, and overall well-being.  Medical   Heart Disease Risk Reduction Clinical staff conducted group or individual video education with verbal and written material and guidebook.  Patient learns our heart is our most vital organ as it circulates oxygen, nutrients, white blood cells, and hormones throughout the entire body, and carries waste away. Data supports a plant-based eating plan like the Pritikin Program for its effectiveness in slowing progression of and reversing heart disease. The video provides a number of recommendations to address heart disease.   Metabolic Syndrome and Belly Fat   Clinical staff conducted group or individual video education with verbal and written material and guidebook.  Patient learns what metabolic syndrome is, how it leads to heart disease, and how one can reverse it and keep it from coming back. You have metabolic syndrome if you have 3 of the following 5 criteria: abdominal obesity, high blood pressure, high triglycerides, low HDL cholesterol, and high blood sugar.  Hypertension and Heart Disease Clinical staff conducted group or individual video education with verbal and written material and guidebook.  Patient learns that high blood pressure, or hypertension, is very common in the [place] . Hypertension is largely due to excessive salt intake, but other important risk factors include being overweight, physical inactivity, drinking too much alcohol , smoking, and not eating enough potassium from fruits and vegetables. High blood pressure is a leading risk factor for heart attack, stroke, congestive heart failure, dementia, kidney failure, and premature death. Long-term effects of excessive salt intake include stiffening of the arteries and thickening of heart muscle and organ damage. Recommendations include ways to reduce hypertension and the risk of heart disease.  Diseases of Our Time - Focusing on Diabetes Clinical staff conducted group or individual video education with verbal and written material and guidebook.  Patient learns why the best way to stop diseases of our time is prevention, through food and other lifestyle changes. Medicine (such as prescription pills and surgeries) is often only a Band-Aid on the problem, not a long-term solution. Most common diseases of our time include obesity, type 2 diabetes, hypertension, heart disease, and cancer. The Pritikin Program is recommended and has been proven to help reduce, reverse, and/or prevent the damaging effects of metabolic syndrome.  Nutrition   Overview of the Pritikin Eating Plan   Clinical staff conducted group or individual video education with verbal and written material and guidebook.  Patient learns about the Pritikin Eating Plan for disease risk reduction. The Pritikin Eating Plan emphasizes a wide variety of unrefined, minimally-processed carbohydrates, like fruits, vegetables, whole grains, and legumes. Go, Caution, and Stop food choices are explained. Plant-based and lean animal proteins are emphasized. Rationale provided for low sodium intake for blood pressure control, low added sugars for blood sugar stabilization, and low added fats and oils for coronary artery disease risk reduction and weight management.  Calorie Density  Clinical staff conducted group or individual video education with verbal and written material and guidebook.  Patient learns about calorie density and how it impacts the Pritikin Eating Plan. Knowing the characteristics of the food you choose will help you decide whether those foods will lead to weight gain or weight loss, and whether you want to  consume more or less of them. Weight loss is usually a side effect of the Pritikin Eating Plan because of its focus on low calorie-dense foods.  Label Reading  Clinical staff conducted group or individual video education with verbal and written material and guidebook.  Patient learns about the Pritikin recommended label reading guidelines and corresponding recommendations regarding calorie density, added sugars, sodium content, and whole grains.  Dining Out - Part 1  Clinical staff conducted group or individual video education with verbal and written material and guidebook.  Patient learns that restaurant meals can be sabotaging because they can be so high in calories, fat, sodium, and/or sugar. Patient learns recommended strategies on how to positively address this and avoid unhealthy pitfalls.  Facts on Fats  Clinical staff conducted group or individual video education with verbal and written  material and guidebook.  Patient learns that lifestyle modifications can be just as effective, if not more so, as many medications for lowering your risk of heart disease. A Pritikin lifestyle can help to reduce your risk of inflammation and atherosclerosis (cholesterol build-up, or plaque, in the artery walls). Lifestyle interventions such as dietary choices and physical activity address the cause of atherosclerosis. A review of the types of fats and their impact on blood cholesterol levels, along with dietary recommendations to reduce fat intake is also included.  Nutrition Action Plan  Clinical staff conducted group or individual video education with verbal and written material and guidebook.  Patient learns how to incorporate Pritikin recommendations into their lifestyle. Recommendations include planning and keeping personal health goals in mind as an important part of their success.  Healthy Mind-Set    Healthy Minds, Bodies, Hearts  Clinical staff conducted group or individual video education with verbal and written material and guidebook.  Patient learns how to identify when they are stressed. Video will discuss the impact of that stress, as well as the many benefits of stress management. Patient will also be introduced to stress management techniques. The way we think, act, and feel has an impact on our hearts.  How Our Thoughts Can Heal Our Hearts  Clinical staff conducted group or individual video education with verbal and written material and guidebook.  Patient learns that negative thoughts can cause depression and anxiety. This can result in negative lifestyle behavior and serious health problems. Cognitive behavioral therapy is an effective method to help control our thoughts in order to change and improve our emotional outlook.  Additional Videos:  Exercise    Improving Performance  Clinical staff conducted group or individual video education with verbal and written material and  guidebook.  Patient learns to use a non-linear approach by alternating intensity levels and lengths of time spent exercising to help burn more calories and lose more body fat. Cardiovascular exercise helps improve heart health, metabolism, hormonal balance, blood sugar control, and recovery from fatigue. Resistance training improves strength, endurance, balance, coordination, reaction time, metabolism, and muscle mass. Flexibility exercise improves circulation, posture, and balance. Seek guidance from your physician and exercise physiologist before implementing an exercise routine and learn your capabilities and proper form for all exercise.  Introduction to Yoga  Clinical staff conducted group or individual video education with verbal and written material and guidebook.  Patient learns about yoga, a discipline of the coming together of mind, breath, and body. The benefits of yoga include improved flexibility, improved range of motion, better posture and core strength, increased lung function, weight loss, and positive self-image. Yoga's heart health benefits include  lowered blood pressure, healthier heart rate, decreased cholesterol and triglyceride levels, improved immune function, and reduced stress. Seek guidance from your physician and exercise physiologist before implementing an exercise routine and learn your capabilities and proper form for all exercise.  Medical   Aging: Enhancing Your Quality of Life  Clinical staff conducted group or individual video education with verbal and written material and guidebook.  Patient learns key strategies and recommendations to stay in good physical health and enhance quality of life, such as prevention strategies, having an advocate, securing a Health Care Proxy and Power of Attorney, and keeping a list of medications and system for tracking them. It also discusses how to avoid risk for bone loss.  Biology of Weight Control  Clinical staff conducted group or  individual video education with verbal and written material and guidebook.  Patient learns that weight gain occurs because we consume more calories than we burn (eating more, moving less). Even if your body weight is normal, you may have higher ratios of fat compared to muscle mass. Too much body fat puts you at increased risk for cardiovascular disease, heart attack, stroke, type 2 diabetes, and obesity-related cancers. In addition to exercise, following the Pritikin Eating Plan can help reduce your risk.  Decoding Lab Results  Clinical staff conducted group or individual video education with verbal and written material and guidebook.  Patient learns that lab test reflects one measurement whose values change over time and are influenced by many factors, including medication, stress, sleep, exercise, food, hydration, pre-existing medical conditions, and more. It is recommended to use the knowledge from this video to become more involved with your lab results and evaluate your numbers to speak with your doctor.   Diseases of Our Time - Overview  Clinical staff conducted group or individual video education with verbal and written material and guidebook.  Patient learns that according to the CDC, 50% to 70% of chronic diseases (such as obesity, type 2 diabetes, elevated lipids, hypertension, and heart disease) are avoidable through lifestyle improvements including healthier food choices, listening to satiety cues, and increased physical activity.  Sleep Disorders Clinical staff conducted group or individual video education with verbal and written material and guidebook.  Patient learns how good quality and duration of sleep are important to overall health and well-being. Patient also learns about sleep disorders and how they impact health along with recommendations to address them, including discussing with a physician.  Nutrition  Dining Out - Part 2 Clinical staff conducted group or individual video  education with verbal and written material and guidebook.  Patient learns how to plan ahead and communicate in order to maximize their dining experience in a healthy and nutritious manner. Included are recommended food choices based on the type of restaurant the patient is visiting.   Fueling a Banker conducted group or individual video education with verbal and written material and guidebook.  There is a strong connection between our food choices and our health. Diseases like obesity and type 2 diabetes are very prevalent and are in large-part due to lifestyle choices. The Pritikin Eating Plan provides plenty of food and hunger-curbing satisfaction. It is easy to follow, affordable, and helps reduce health risks.  Menu Workshop  Clinical staff conducted group or individual video education with verbal and written material and guidebook.  Patient learns that restaurant meals can sabotage health goals because they are often packed with calories, fat, sodium, and sugar. Recommendations include strategies to plan ahead  and to communicate with the manager, chef, or server to help order a healthier meal.  Planning Your Eating Strategy  Clinical staff conducted group or individual video education with verbal and written material and guidebook.  Patient learns about the Pritikin Eating Plan and its benefit of reducing the risk of disease. The Pritikin Eating Plan does not focus on calories. Instead, it emphasizes high-quality, nutrient-rich foods. By knowing the characteristics of the foods, we choose, we can determine their calorie density and make informed decisions.  Targeting Your Nutrition Priorities  Clinical staff conducted group or individual video education with verbal and written material and guidebook.  Patient learns that lifestyle habits have a tremendous impact on disease risk and progression. This video provides eating and physical activity recommendations based on your  personal health goals, such as reducing LDL cholesterol, losing weight, preventing or controlling type 2 diabetes, and reducing high blood pressure.  Vitamins and Minerals  Clinical staff conducted group or individual video education with verbal and written material and guidebook.  Patient learns different ways to obtain key vitamins and minerals, including through a recommended healthy diet. It is important to discuss all supplements you take with your doctor.   Healthy Mind-Set    Smoking Cessation  Clinical staff conducted group or individual video education with verbal and written material and guidebook.  Patient learns that cigarette smoking and tobacco addiction pose a serious health risk which affects millions of people. Stopping smoking will significantly reduce the risk of heart disease, lung disease, and many forms of cancer. Recommended strategies for quitting are covered, including working with your doctor to develop a successful plan.  Culinary   Becoming a Set designer conducted group or individual video education with verbal and written material and guidebook.  Patient learns that cooking at home can be healthy, cost-effective, quick, and puts them in control. Keys to cooking healthy recipes will include looking at your recipe, assessing your equipment needs, planning ahead, making it simple, choosing cost-effective seasonal ingredients, and limiting the use of added fats, salts, and sugars.  Cooking - Breakfast and Snacks  Clinical staff conducted group or individual video education with verbal and written material and guidebook.  Patient learns how important breakfast is to satiety and nutrition through the entire day. Recommendations include key foods to eat during breakfast to help stabilize blood sugar levels and to prevent overeating at meals later in the day. Planning ahead is also a key component.  Cooking - Educational psychologist conducted  group or individual video education with verbal and written material and guidebook.  Patient learns eating strategies to improve overall health, including an approach to cook more at home. Recommendations include thinking of animal protein as a side on your plate rather than center stage and focusing instead on lower calorie dense options like vegetables, fruits, whole grains, and plant-based proteins, such as beans. Making sauces in large quantities to freeze for later and leaving the skin on your vegetables are also recommended to maximize your experience.  Cooking - Healthy Salads and Dressing Clinical staff conducted group or individual video education with verbal and written material and guidebook.  Patient learns that vegetables, fruits, whole grains, and legumes are the foundations of the Pritikin Eating Plan. Recommendations include how to incorporate each of these in flavorful and healthy salads, and how to create homemade salad dressings. Proper handling of ingredients is also covered. Cooking - Soups and Desserts  Cooking - Soups and  Desserts Clinical staff conducted group or individual video education with verbal and written material and guidebook.  Patient learns that Pritikin soups and desserts make for easy, nutritious, and delicious snacks and meal components that are low in sodium, fat, sugar, and calorie density, while high in vitamins, minerals, and filling fiber. Recommendations include simple and healthy ideas for soups and desserts.   Overview     The Pritikin Solution Program Overview Clinical staff conducted group or individual video education with verbal and written material and guidebook.  Patient learns that the results of the Pritikin Program have been documented in more than 100 articles published in peer-reviewed journals, and the benefits include reducing risk factors for (and, in some cases, even reversing) high cholesterol, high blood pressure, type 2 diabetes, obesity,  and more! An overview of the three key pillars of the Pritikin Program will be covered: eating well, doing regular exercise, and having a healthy mind-set.  WORKSHOPS  Exercise: Exercise Basics: Building Your Action Plan Clinical staff led group instruction and group discussion with PowerPoint presentation and patient guidebook. To enhance the learning environment the use of posters, models and videos may be added. At the conclusion of this workshop, patients will comprehend the difference between physical activity and exercise, as well as the benefits of incorporating both, into their routine. Patients will understand the FITT (Frequency, Intensity, Time, and Type) principle and how to use it to build an exercise action plan. In addition, safety concerns and other considerations for exercise and cardiac rehab will be addressed by the presenter. The purpose of this lesson is to promote a comprehensive and effective weekly exercise routine in order to improve patients' overall level of fitness.   Managing Heart Disease: Your Path to a Healthier Heart Clinical staff led group instruction and group discussion with PowerPoint presentation and patient guidebook. To enhance the learning environment the use of posters, models and videos may be added.At the conclusion of this workshop, patients will understand the anatomy and physiology of the heart. Additionally, they will understand how Pritikin's three pillars impact the risk factors, the progression, and the management of heart disease.  The purpose of this lesson is to provide a high-level overview of the heart, heart disease, and how the Pritikin lifestyle positively impacts risk factors.  Exercise Biomechanics Clinical staff led group instruction and group discussion with PowerPoint presentation and patient guidebook. To enhance the learning environment the use of posters, models and videos may be added. Patients will learn how the structural  parts of their bodies function and how these functions impact their daily activities, movement, and exercise. Patients will learn how to promote a neutral spine, learn how to manage pain, and identify ways to improve their physical movement in order to promote healthy living. The purpose of this lesson is to expose patients to common physical limitations that impact physical activity. Participants will learn practical ways to adapt and manage aches and pains, and to minimize their effect on regular exercise. Patients will learn how to maintain good posture while sitting, walking, and lifting.  Balance Training and Fall Prevention  Clinical staff led group instruction and group discussion with PowerPoint presentation and patient guidebook. To enhance the learning environment the use of posters, models and videos may be added. At the conclusion of this workshop, patients will understand the importance of their sensorimotor skills (vision, proprioception, and the vestibular system) in maintaining their ability to balance as they age. Patients will apply a variety of balancing exercises that  are appropriate for their current level of function. Patients will understand the common causes for poor balance, possible solutions to these problems, and ways to modify their physical environment in order to minimize their fall risk. The purpose of this lesson is to teach patients about the importance of maintaining balance as they age and ways to minimize their risk of falling.  WORKSHOPS   Nutrition:  Fueling a Ship broker led group instruction and group discussion with PowerPoint presentation and patient guidebook. To enhance the learning environment the use of posters, models and videos may be added. Patients will review the foundational principles of the Pritikin Eating Plan and understand what constitutes a serving size in each of the food groups. Patients will also learn Pritikin-friendly  foods that are better choices when away from home and review make-ahead meal and snack options. Calorie density will be reviewed and applied to three nutrition priorities: weight maintenance, weight loss, and weight gain. The purpose of this lesson is to reinforce (in a group setting) the key concepts around what patients are recommended to eat and how to apply these guidelines when away from home by planning and selecting Pritikin-friendly options. Patients will understand how calorie density may be adjusted for different weight management goals.  Mindful Eating  Clinical staff led group instruction and group discussion with PowerPoint presentation and patient guidebook. To enhance the learning environment the use of posters, models and videos may be added. Patients will briefly review the concepts of the Pritikin Eating Plan and the importance of low-calorie dense foods. The concept of mindful eating will be introduced as well as the importance of paying attention to internal hunger signals. Triggers for non-hunger eating and techniques for dealing with triggers will be explored. The purpose of this lesson is to provide patients with the opportunity to review the basic principles of the Pritikin Eating Plan, discuss the value of eating mindfully and how to measure internal cues of hunger and fullness using the Hunger Scale. Patients will also discuss reasons for non-hunger eating and learn strategies to use for controlling emotional eating.  Targeting Your Nutrition Priorities Clinical staff led group instruction and group discussion with PowerPoint presentation and patient guidebook. To enhance the learning environment the use of posters, models and videos may be added. Patients will learn how to determine their genetic susceptibility to disease by reviewing their family history. Patients will gain insight into the importance of diet as part of an overall healthy lifestyle in mitigating the impact of  genetics and other environmental insults. The purpose of this lesson is to provide patients with the opportunity to assess their personal nutrition priorities by looking at their family history, their own health history and current risk factors. Patients will also be able to discuss ways of prioritizing and modifying the Pritikin Eating Plan for their highest risk areas  Menu  Clinical staff led group instruction and group discussion with PowerPoint presentation and patient guidebook. To enhance the learning environment the use of posters, models and videos may be added. Using menus brought in from [employer], or printed from [employer], patients will apply the Pritikin dining out guidelines that were presented in the [employer] video. Patients will also be able to practice these guidelines in a variety of provided scenarios. The purpose of this lesson is to provide patients with the opportunity to practice hands-on learning of the Pritikin Dining Out guidelines with actual menus and practice scenarios.  Label Reading Clinical staff  led group instruction and group discussion with PowerPoint presentation and patient guidebook. To enhance the learning environment the use of posters, models and videos may be added. Patients will review and discuss the Pritikin label reading guidelines presented in [employer]'s Label Reading Educational series video. Using fool labels brought in from local grocery stores and markets, patients will apply the label reading guidelines and determine if the packaged food meet the Pritikin guidelines. The purpose of this lesson is to provide patients with the opportunity to review, discuss, and practice hands-on learning of the Pritikin Label Reading guidelines with actual packaged food labels. Cooking School  [employer]'s [employer] are designed to teach patients ways to prepare quick, simple, and affordable recipes at home. The importance of  nutrition's role in chronic disease risk reduction is reflected in its emphasis in the overall Pritikin program. By learning how to prepare essential core Pritikin Eating Plan recipes, patients will increase control over what they eat; be able to customize the flavor of foods without the use of added salt, sugar, or fat; and improve the quality of the food they consume. By learning a set of core recipes which are easily assembled, quickly prepared, and affordable, patients are more likely to prepare more healthy foods at home. These workshops focus on convenient breakfasts, simple entres, side dishes, and desserts which can be prepared with minimal effort and are consistent with nutrition recommendations for cardiovascular risk reduction. Cooking [employer] are taught by a Armed forces logistics/support/administrative officer (RD) who has been trained by the [employer]. The chef or RD has a clear understanding of the importance of minimizing - if not completely eliminating - added fat, sugar, and sodium in recipes. Throughout the series of Cooking School Workshop sessions, patients will learn about healthy ingredients and efficient methods of cooking to build confidence in their capability to prepare    Cooking School weekly topics:  Adding Flavor- Sodium-Free  Fast and Healthy Breakfasts  Powerhouse Plant-Based Proteins  Satisfying Salads and Dressings  Simple Sides and Sauces  International Cuisine-Spotlight on the [employer] Zones  Delicious Desserts  Savory Soups  [employer] - Meals in a Astronomer Appetizers and Snacks  Comforting Weekend Breakfasts  One-Pot Wonders   Fast Evening Meals  Landscape architect Your Pritikin Plate  WORKSHOPS   Healthy Mindset (Psychosocial):  Focused Goals, Sustainable Changes Clinical staff led group instruction and group discussion with PowerPoint presentation and patient guidebook. To enhance the learning environment the use of posters,  models and videos may be added. Patients will be able to apply effective goal setting strategies to establish at least one personal goal, and then take consistent, meaningful action toward that goal. They will learn to identify common barriers to achieving personal goals and develop strategies to overcome them. Patients will also gain an understanding of how our mind-set can impact our ability to achieve goals and the importance of cultivating a positive and growth-oriented mind-set. The purpose of this lesson is to provide patients with a deeper understanding of how to set and achieve personal goals, as well as the tools and strategies needed to overcome common obstacles which may arise along the way.  From Head to Heart: The Power of a Healthy Outlook  Clinical staff led group instruction and group discussion with PowerPoint presentation and patient guidebook. To enhance the learning environment the use of posters, models and videos may be added. Patients will be able to recognize and describe the impact of  emotions and mood on physical health. They will discover the importance of self-care and explore self-care practices which may work for them. Patients will also learn how to utilize the 4 C's to cultivate a healthier outlook and better manage stress and challenges. The purpose of this lesson is to demonstrate to patients how a healthy outlook is an essential part of maintaining good health, especially as they continue their cardiac rehab journey.  Healthy Sleep for a Healthy Heart Clinical staff led group instruction and group discussion with PowerPoint presentation and patient guidebook. To enhance the learning environment the use of posters, models and videos may be added. At the conclusion of this workshop, patients will be able to demonstrate knowledge of the importance of sleep to overall health, well-being, and quality of life. They will understand the symptoms of, and treatments for, common sleep  disorders. Patients will also be able to identify daytime and nighttime behaviors which impact sleep, and they will be able to apply these tools to help manage sleep-related challenges. The purpose of this lesson is to provide patients with a general overview of sleep and outline the importance of quality sleep. Patients will learn about a few of the most common sleep disorders. Patients will also be introduced to the concept of "sleep hygiene," and discover ways to self-manage certain sleeping problems through simple daily behavior changes. Finally, the workshop will motivate patients by clarifying the links between quality sleep and their goals of heart-healthy living.   Recognizing and Reducing Stress Clinical staff led group instruction and group discussion with PowerPoint presentation and patient guidebook. To enhance the learning environment the use of posters, models and videos may be added. At the conclusion of this workshop, patients will be able to understand the types of stress reactions, differentiate between acute and chronic stress, and recognize the impact that chronic stress has on their health. They will also be able to apply different coping mechanisms, such as reframing negative self-talk. Patients will have the opportunity to practice a variety of stress management techniques, such as deep abdominal breathing, progressive muscle relaxation, and/or guided imagery.  The purpose of this lesson is to educate patients on the role of stress in their lives and to provide healthy techniques for coping with it.  Learning Barriers/Preferences:  Learning Barriers/Preferences - 02/16/24 1600       Learning Barriers/Preferences   Learning Barriers Hearing;Sight    Learning Preferences Audio;Computer/Internet;Group Instruction;Skilled Demonstration;Verbal Instruction;Video;Written Material;Individual Instruction;Pictoral          Education Topics:  Knowledge Questionnaire Score:  Knowledge  Questionnaire Score - 02/16/24 1558       Knowledge Questionnaire Score   Pre Score 20/24          Core Components/Risk Factors/Patient Goals at Admission:  Personal Goals and Risk Factors at Admission - 02/16/24 1601       Core Components/Risk Factors/Patient Goals on Admission    Weight Management Yes;Obesity    Intervention Weight Management: Provide education and appropriate resources to help participant work on and attain dietary goals.;Weight Management: Develop a combined nutrition and exercise program designed to reach desired caloric intake, while maintaining appropriate intake of nutrient and fiber, sodium and fats, and appropriate energy expenditure required for the weight goal.;Weight Management/Obesity: Establish reasonable short term and long term weight goals.;Obesity: Provide education and appropriate resources to help participant work on and attain dietary goals.    Expected Outcomes Long Term: Adherence to nutrition and physical activity/exercise program aimed toward attainment of established weight  goal;Short Term: Continue to assess and modify interventions until short term weight is achieved;Understanding recommendations for meals to include 15-35% energy as protein, 25-35% energy from fat, 35-60% energy from carbohydrates, less than 200mg  of dietary cholesterol, 20-35 gm of total fiber daily;Understanding of distribution of calorie intake throughout the day with the consumption of 4-5 meals/snacks    Diabetes Yes    Intervention Provide education about signs/symptoms and action to take for hypo/hyperglycemia.;Provide education about proper nutrition, including hydration, and aerobic/resistive exercise prescription along with prescribed medications to achieve blood glucose in normal ranges: Fasting glucose 65-99 mg/dL    Expected Outcomes Short Term: Participant verbalizes understanding of the signs/symptoms and immediate care of hyper/hypoglycemia, proper foot care and  importance of medication, aerobic/resistive exercise and nutrition plan for blood glucose control.;Long Term: Attainment of HbA1C < 7%.    Heart Failure Yes    Intervention Provide a combined exercise and nutrition program that is supplemented with education, support and counseling about heart failure. Directed toward relieving symptoms such as shortness of breath, decreased exercise tolerance, and extremity edema.    Expected Outcomes Improve functional capacity of life;Short term: Attendance in program 2-3 days a week with increased exercise capacity. Reported lower sodium intake. Reported increased fruit and vegetable intake. Reports medication compliance.;Short term: Daily weights obtained and reported for increase. Utilizing diuretic protocols set by physician.;Long term: Adoption of self-care skills and reduction of barriers for early signs and symptoms recognition and intervention leading to self-care maintenance.    Hypertension Yes    Intervention Provide education on lifestyle modifcations including regular physical activity/exercise, weight management, moderate sodium restriction and increased consumption of fresh fruit, vegetables, and low fat dairy, alcohol  moderation, and smoking cessation.;Monitor prescription use compliance.    Expected Outcomes Short Term: Continued assessment and intervention until BP is < 140/1mm HG in hypertensive participants. < 130/37mm HG in hypertensive participants with diabetes, heart failure or chronic kidney disease.;Long Term: Maintenance of blood pressure at goal levels.    Lipids Yes    Intervention Provide education and support for participant on nutrition & aerobic/resistive exercise along with prescribed medications to achieve LDL 70mg , HDL >40mg .    Expected Outcomes Short Term: Participant states understanding of desired cholesterol values and is compliant with medications prescribed. Participant is following exercise prescription and nutrition  guidelines.;Long Term: Cholesterol controlled with medications as prescribed, with individualized exercise RX and with personalized nutrition plan. Value goals: LDL < 70mg , HDL > 40 mg.    Stress Yes    Intervention Offer individual and/or small group education and counseling on adjustment to heart disease, stress management and health-related lifestyle change. Teach and support self-help strategies.;Refer participants experiencing significant psychosocial distress to appropriate mental health specialists for further evaluation and treatment. When possible, include family members and significant others in education/counseling sessions.    Expected Outcomes Long Term: Emotional wellbeing is indicated by absence of clinically significant psychosocial distress or social isolation.;Short Term: Participant demonstrates changes in health-related behavior, relaxation and other stress management skills, ability to obtain effective social support, and compliance with psychotropic medications if prescribed.          Core Components/Risk Factors/Patient Goals Review:   Goals and Risk Factor Review     Row Name 03/13/24 1543             Core Components/Risk Factors/Patient Goals Review   Personal Goals Review Weight Management/Obesity;Heart Failure;Lipids;Diabetes;Hypertension       Review Talon is doing fair with exercise for his fitness level.  Vital signs have been stable. No further ventricular ectopy noted during exercise since 02/24/24       Expected Outcomes [place] will continue to particpate in cardiac rehab for exercise, nutrition and lifestyle modifications          Core Components/Risk Factors/Patient Goals at Discharge (Final Review):   Goals and Risk Factor Review - 03/13/24 1543       Core Components/Risk Factors/Patient Goals Review   Personal Goals Review Weight Management/Obesity;Heart Failure;Lipids;Diabetes;Hypertension    Review Chi is doing fair with exercise for his fitness  level. Vital signs have been stable. No further ventricular ectopy noted during exercise since 02/24/24    Expected Outcomes Dayvion will continue to particpate in cardiac rehab for exercise, nutrition and lifestyle modifications          ITP Comments:  ITP Comments     Row Name 02/16/24 1316 03/13/24 1536         ITP Comments Dr. Wilbert Bihari medical director. Introduction to [facility] education [facility] cardiac rehab. Initial orientation packet reviewed with patient 30 Day ITP Review. Edahi has good participation for his fitness level at cardiac rehab. Trust remains somewhat deconditioned.         Comments: See ITP comments.Hadassah Elpidio Quan RN BSN

## 2024-03-14 ENCOUNTER — Encounter (HOSPITAL_COMMUNITY)
Admission: RE | Admit: 2024-03-14 | Discharge: 2024-03-14 | Disposition: A | Discharge status: Home / Self Care | Source: Ambulatory Visit | Attending: Cardiovascular Disease | Admitting: Cardiovascular Disease

## 2024-03-14 DIAGNOSIS — I214 Non-ST elevation (NSTEMI) myocardial infarction: Secondary | ICD-10-CM | POA: Diagnosis not present

## 2024-03-14 DIAGNOSIS — Z955 Presence of coronary angioplasty implant and graft: Secondary | ICD-10-CM

## 2024-03-16 ENCOUNTER — Encounter (HOSPITAL_COMMUNITY)
Admission: RE | Admit: 2024-03-16 | Discharge: 2024-03-16 | Disposition: A | Discharge status: Home / Self Care | Source: Ambulatory Visit | Attending: Cardiovascular Disease

## 2024-03-16 DIAGNOSIS — I214 Non-ST elevation (NSTEMI) myocardial infarction: Secondary | ICD-10-CM

## 2024-03-16 DIAGNOSIS — Z955 Presence of coronary angioplasty implant and graft: Secondary | ICD-10-CM

## 2024-03-19 ENCOUNTER — Encounter (HOSPITAL_COMMUNITY)
Admission: RE | Admit: 2024-03-19 | Discharge: 2024-03-19 | Disposition: A | Discharge status: Home / Self Care | Source: Ambulatory Visit | Attending: Cardiovascular Disease | Admitting: Cardiovascular Disease

## 2024-03-19 DIAGNOSIS — Z955 Presence of coronary angioplasty implant and graft: Secondary | ICD-10-CM

## 2024-03-19 DIAGNOSIS — I214 Non-ST elevation (NSTEMI) myocardial infarction: Secondary | ICD-10-CM | POA: Diagnosis not present

## 2024-03-19 NOTE — Progress Notes (Signed)
 CARDIAC REHAB PHASE 2  Reviewed home exercise with pt today. Pt is tolerating exercise well. Pt will continue to exercise on his own by walking and doing chair fitness classes on youtube for 30-45 minutes per session (may be broken up into 10 min bouts as needed 1-2 days a week in addition to the 3 days in CRP2. Advised pt on THRR, RPE scale, hydration and temperature/humidity precautions. Reinforced S/S to stop exercise and when to call MD vs 911. Encouraged warm up cool down and stretches with exercise sessions. Pt verbalized understanding, all questions were answered and pt was given a copy to take home.    Dravin Lance S Judson Tsan ACSM-CEP 03/19/2024 4:50 PM

## 2024-03-21 ENCOUNTER — Encounter (HOSPITAL_COMMUNITY)
Admission: RE | Admit: 2024-03-21 | Discharge: 2024-03-21 | Disposition: A | Discharge status: Home / Self Care | Source: Ambulatory Visit | Attending: Cardiovascular Disease

## 2024-03-21 DIAGNOSIS — I214 Non-ST elevation (NSTEMI) myocardial infarction: Secondary | ICD-10-CM

## 2024-03-21 DIAGNOSIS — Z955 Presence of coronary angioplasty implant and graft: Secondary | ICD-10-CM

## 2024-03-23 ENCOUNTER — Encounter (HOSPITAL_COMMUNITY)
Admission: RE | Admit: 2024-03-23 | Discharge: 2024-03-23 | Disposition: A | Discharge status: Home / Self Care | Source: Ambulatory Visit | Attending: Cardiovascular Disease | Admitting: Cardiovascular Disease

## 2024-03-23 DIAGNOSIS — I214 Non-ST elevation (NSTEMI) myocardial infarction: Secondary | ICD-10-CM | POA: Diagnosis not present

## 2024-03-23 DIAGNOSIS — Z955 Presence of coronary angioplasty implant and graft: Secondary | ICD-10-CM

## 2024-03-26 ENCOUNTER — Encounter (HOSPITAL_COMMUNITY)
Admission: RE | Admit: 2024-03-26 | Discharge: 2024-03-26 | Disposition: A | Discharge status: Home / Self Care | Source: Ambulatory Visit | Attending: Cardiovascular Disease | Admitting: Cardiovascular Disease

## 2024-03-26 DIAGNOSIS — I214 Non-ST elevation (NSTEMI) myocardial infarction: Secondary | ICD-10-CM

## 2024-03-26 DIAGNOSIS — Z955 Presence of coronary angioplasty implant and graft: Secondary | ICD-10-CM

## 2024-03-28 ENCOUNTER — Encounter (HOSPITAL_COMMUNITY)
Admission: RE | Admit: 2024-03-28 | Discharge: 2024-03-28 | Disposition: A | Discharge status: Home / Self Care | Source: Ambulatory Visit | Attending: Cardiovascular Disease

## 2024-03-28 DIAGNOSIS — I214 Non-ST elevation (NSTEMI) myocardial infarction: Secondary | ICD-10-CM

## 2024-03-28 DIAGNOSIS — Z955 Presence of coronary angioplasty implant and graft: Secondary | ICD-10-CM

## 2024-03-30 ENCOUNTER — Encounter (HOSPITAL_COMMUNITY)
Admission: RE | Admit: 2024-03-30 | Discharge: 2024-03-30 | Disposition: A | Discharge status: Home / Self Care | Source: Ambulatory Visit | Attending: Cardiovascular Disease | Admitting: Cardiovascular Disease

## 2024-03-30 DIAGNOSIS — Z955 Presence of coronary angioplasty implant and graft: Secondary | ICD-10-CM

## 2024-03-30 DIAGNOSIS — I214 Non-ST elevation (NSTEMI) myocardial infarction: Secondary | ICD-10-CM

## 2024-04-02 ENCOUNTER — Encounter (HOSPITAL_COMMUNITY): Admission: RE | Admit: 2024-04-02

## 2024-04-04 ENCOUNTER — Encounter (HOSPITAL_COMMUNITY)
Admission: RE | Admit: 2024-04-04 | Discharge: 2024-04-04 | Disposition: A | Discharge status: Home / Self Care | Source: Ambulatory Visit | Attending: Cardiovascular Disease | Admitting: Cardiovascular Disease

## 2024-04-04 DIAGNOSIS — Z955 Presence of coronary angioplasty implant and graft: Secondary | ICD-10-CM | POA: Diagnosis present

## 2024-04-04 DIAGNOSIS — I214 Non-ST elevation (NSTEMI) myocardial infarction: Secondary | ICD-10-CM | POA: Insufficient documentation

## 2024-04-06 ENCOUNTER — Encounter (HOSPITAL_COMMUNITY)
Admission: RE | Admit: 2024-04-06 | Discharge: 2024-04-06 | Disposition: A | Discharge status: Home / Self Care | Source: Ambulatory Visit | Attending: Cardiovascular Disease | Admitting: Cardiovascular Disease

## 2024-04-06 DIAGNOSIS — Z955 Presence of coronary angioplasty implant and graft: Secondary | ICD-10-CM

## 2024-04-06 DIAGNOSIS — I214 Non-ST elevation (NSTEMI) myocardial infarction: Secondary | ICD-10-CM | POA: Diagnosis not present

## 2024-04-09 ENCOUNTER — Encounter (HOSPITAL_COMMUNITY)
Admission: RE | Admit: 2024-04-09 | Discharge: 2024-04-09 | Disposition: A | Discharge status: Home / Self Care | Source: Ambulatory Visit | Attending: Cardiovascular Disease | Admitting: Cardiovascular Disease

## 2024-04-09 DIAGNOSIS — I214 Non-ST elevation (NSTEMI) myocardial infarction: Secondary | ICD-10-CM

## 2024-04-09 DIAGNOSIS — Z955 Presence of coronary angioplasty implant and graft: Secondary | ICD-10-CM

## 2024-04-11 ENCOUNTER — Encounter (HOSPITAL_COMMUNITY): Admission: RE | Admit: 2024-04-11 | Source: Ambulatory Visit

## 2024-04-11 NOTE — Progress Notes (Signed)
 Cardiac Individual Treatment Plan  Patient Details  Name: [name] MRN: [id number] Date of Birth: [birth year] Referring Provider:   Flowsheet Row INTENSIVE CARDIAC REHAB ORIENT from 02/16/2024 in [facility]  Referring Provider Ozell Fell, MD    Initial Encounter Date:  Flowsheet Row INTENSIVE CARDIAC REHAB ORIENT from 02/16/2024 in [facility]  Date 02/16/24    Visit Diagnosis: NSTEMI (non-ST elevated myocardial infarction) [facility])  Status post coronary artery stent placement  Patient's Home Medications on Admission:  Current Outpatient Medications:    amoxicillin  (AMOXIL ) 500 MG tablet, Take 4 tablets (2,000 mg total) by mouth as directed. 1 hour prior to dental work including cleanings, Disp: 12 tablet, Rfl: 12   apixaban  (ELIQUIS ) 5 MG TABS tablet, Take 1 tablet (5 mg total) by mouth 2 (two) times daily., Disp: 180 tablet, Rfl: 2   ascorbic acid (VITAMIN C) 500 MG tablet, Take 500 mg by mouth daily., Disp: , Rfl:    Carboxymethylcellulose Sodium (LUBRICANT EYE DROPS OP), Place 1 drop into both eyes daily as needed (dry eyes)., Disp: , Rfl:    carvedilol  (COREG ) 3.125 MG tablet, Take 1 tablet (3.125 mg total) by mouth 2 (two) times daily with a meal., Disp: 180 tablet, Rfl: 3   Cholecalciferol (VITAMIN D) 50 MCG (2000 UT) tablet, Take 2,000 Units by mouth daily., Disp: , Rfl:    Cyanocobalamin (B-12) 2500 MCG TABS, Take 2,500 mcg by mouth daily., Disp: , Rfl:    furosemide  (LASIX ) 20 MG tablet, Take 2 tablets (40 mg total) by mouth daily., Disp: 90 tablet, Rfl: 3   sacubitril -valsartan  (ENTRESTO ) 24-26 MG, Take 1 tablet by mouth 2 (two) times daily., Disp: 180 tablet, Rfl: 2  Past Medical History: Past Medical History:  Diagnosis Date   (HFimpEF) heart failure with improved EF    Echo 04/2019: EF 35-40 // Echo 5/22: EF 55-60, no RWMA, GR 1 DD, moderate to  severe MR, myxomatous mitral valve, similar to prior echocardiogram, mildly elevated PASP, RVSP 35.9, mild AI, mild AV sclerosis without AS, mild dilation of ascending aorta (43 mm)   A-fib (HCC)    RVR-DCCV   Abnormal PSA 01/17/2012   10.38/4/5/ PSA:2013=6.96/ PSA 07/08/2011=6.26/ PSA 2006=8.6   Ascending aorta dilation 09/16/2021   Echocardiogram March 2023: 41 mm   Blood transfusion without reported diagnosis    during vagotomy 1976   CAD (coronary artery disease)    Chronic kidney disease    nephrolithiasis right kidney   Coronary artery disease    ED (erectile dysfunction)    GERD (gastroesophageal reflux disease)    PUD S/P PARTIAL VAGOTOMY   Gynecomastia 04/21/2011   seen by Dr.Murray, no rad tx   H/O impacted cerumen    S/P ENT DR BATES   History of echocardiogram    echo 4/16:  mild LVH, EF 55-60%, Gr 1 DD, no RWMA, mild AI, mild MR, mod LAE, mild TR   Hydroureteronephrosis    right    Hyperlipidemia    Hypertension    Insomnia    Migraines    WITH AURA   Nephrolithiasis    Osteopenia    Overweight    Prostate cancer (HCC) 12/09/03 dx   Prostate ca/adenocarcinoma,gleason=3+3=6    pSA 4.6   RBBB (right bundle branch block)    Rosacea    S/P mitral valve clip implantation 12/22/2023   s/p Mitraclip NTW x 2 with  a reduction from 3-4+ MR to 1 + residual MR.   Sinus bradycardia    Sleep apnea    pt unaware, no sleep study   Stroke [facility])    asymptomatic, found on head imaging    Tobacco Use: Social History   Tobacco Use  Smoking Status Former   Types: Pipe   Quit date: 07/05/1984   Years since quitting: 39.7  Smokeless Tobacco Never    Labs: Review Flowsheet  More data exists      Latest Ref Rng & Units 04/25/2019 11/15/2022 08/30/2023 11/14/2023 12/21/2023  Labs for ITP Cardiac and Pulmonary Rehab  Cholestrol 100 - 199 mg/dL - 786  - - -  LDL (calc) 0 - 99 mg/dL - 866  - - -  HDL-C >60 mg/dL - 42  - - -  Trlycerides 0 - 149 mg/dL - 784  - - -  Hemoglobin  A1c 4.8 - 5.6 % - - 7.4  - -  PH, Arterial 7.35 - 7.45 7.411  - - 7.408  7.276   PCO2 arterial 32 - 48 mmHg 46.0  - - 38.7  53.4   Bicarbonate 20.0 - 28.0 mmol/L 32.0  29.5  29.2  - - 27.2  24.4  24.9   TCO2 22 - 32 mmol/L 34  31  31  - 23  29  26  26    Acid-base deficit 0.0 - 2.0 mmol/L - - - - 3.0   O2 Saturation % 76.0  77.0  98.0  - - 62  94  98     Details       Multiple values from one day are sorted in reverse-chronological order         Capillary Blood Glucose: Lab Results  Component Value Date   GLUCAP 133 (H) 02/22/2024   GLUCAP 165 (H) 02/22/2024   GLUCAP 146 (H) 02/20/2024   GLUCAP 218 (H) 02/20/2024   GLUCAP 158 (H) 12/21/2023     Exercise Target Goals: Exercise Program Goal: Individual exercise prescription set using results from initial 6 min walk test and THRR while considering  patient's activity barriers and safety.   Exercise Prescription Goal: Initial exercise prescription builds to 30-45 minutes a day of aerobic activity, 2-3 days per week.  Home exercise guidelines will be given to patient during program as part of exercise prescription that the participant will acknowledge.  Activity Barriers & Risk Stratification:  Activity Barriers & Cardiac Risk Stratification - 02/16/24 1559       Activity Barriers & Cardiac Risk Stratification   Activity Barriers Shortness of Breath;Balance Concerns;History of Falls;Joint Problems;Deconditioning    Cardiac Risk Stratification High   <5 METs on         6 Minute Walk:  6 Minute Walk     Row Name 02/16/24 1315         6 Minute Walk   Phase Initial     Distance 840 feet     Walk Time 6 minutes     # of Rest Breaks 0     MPH 16     METS 1.23     RPE 11     Perceived Dyspnea  0     VO2 Peak 4.3     Symptoms No     Resting HR 68 bpm     Resting BP 142/82     Resting Oxygen Saturation  96 %     Exercise Oxygen Saturation  during 6 min walk 97 %  Max Ex. HR 100 bpm     Max Ex. BP 160/80      2 Minute Post BP 142/84        Oxygen Initial Assessment:   Oxygen Re-Evaluation:   Oxygen Discharge (Final Oxygen Re-Evaluation):   Initial Exercise Prescription:  Initial Exercise Prescription - 02/16/24 1500       Date of Initial Exercise RX and Referring Provider   Date 02/16/24    Referring Provider Ozell Fell, MD    Expected Discharge Date 05/09/24      NuStep   Level 1    SPM 60    Minutes 25    METs 1.9      Prescription Details   Frequency (times per week) 3    Duration Progress to 30 minutes of continuous aerobic without signs/symptoms of physical distress      Intensity   THRR 40-80% of Max Heartrate 54-107    Ratings of Perceived Exertion 11-13    Perceived Dyspnea 0-4      Progression   Progression Continue progressive overload as per policy without signs/symptoms or physical distress.      Resistance Training   Training Prescription Yes    Weight 2    Reps 10-15          Perform Capillary Blood Glucose checks as needed.  Exercise Prescription Changes:   Exercise Prescription Changes     Row Name 02/20/24 1644 03/07/24 1630 03/19/24 1648 03/28/24 1622 04/09/24 1643     Response to Exercise   Blood Pressure (Admit) 142/80 140/70 124/70 130/70 122/80   Blood Pressure (Exercise) 142/90 130/70 -- -- --   Blood Pressure (Exit) 124/76 124/78 124/78 120/60 108/62   Heart Rate (Admit) 69 bpm 51 bpm 72 bpm 51 bpm 75 bpm   Heart Rate (Exercise) 95 bpm 73 bpm 86 bpm 89 bpm 121 bpm   Heart Rate (Exit) 73 bpm 60 bpm 71 bpm 50 bpm 72 bpm   Rating of Perceived Exertion (Exercise) 9 9 10 11 12    Perceived Dyspnea (Exercise) 0 0 0 0 0   Symptoms 0 0 0 0 0   Comments Pt first day in the [facility] ICR program Reviewed MET's and goals Reviewed MET's Reviewed MET's and goals Reviewed MET's and goals   Duration Progress to 30 minutes of  aerobic without signs/symptoms of physical distress Progress to 30 minutes of  aerobic without signs/symptoms of  physical distress Progress to 30 minutes of  aerobic without signs/symptoms of physical distress Progress to 30 minutes of  aerobic without signs/symptoms of physical distress Progress to 30 minutes of  aerobic without signs/symptoms of physical distress   Intensity THRR unchanged THRR unchanged THRR unchanged THRR unchanged THRR unchanged     Progression   Progression Continue to progress workloads to maintain intensity without signs/symptoms of physical distress. Continue to progress workloads to maintain intensity without signs/symptoms of physical distress. Continue to progress workloads to maintain intensity without signs/symptoms of physical distress. Continue to progress workloads to maintain intensity without signs/symptoms of physical distress. Continue to progress workloads to maintain intensity without signs/symptoms of physical distress.   Average METs 1.4 1.7 1.8 1.6 1.7     Resistance Training   Training Prescription Yes No Yes Yes Yes   Weight 2 2 2 2 3    Reps 10-15 10-15 10-15 10-15 10-15   Time 10 Minutes 10 Minutes 10 Minutes 10 Minutes 10 Minutes     NuStep   Level 1 1  3 3 3    SPM 64 72 74 70 74   Minutes 25 30 30 30 30    METs 1.4 1.7 1.8 1.6 1.7     Home Exercise Plan   Plans to continue exercise at -- -- Home (comment) Home (comment) Home (comment)   Frequency -- -- Add 2 additional days to program exercise sessions. Add 2 additional days to program exercise sessions. Add 2 additional days to program exercise sessions.   Initial Home Exercises Provided -- -- 03/19/24 03/19/24 03/19/24      Exercise Comments:   Exercise Comments     Row Name 02/20/24 1648 03/08/24 0936 03/19/24 1418 03/28/24 1624 04/09/24 1649   Exercise Comments Pt first day in the [facility] ICR program. Pt tolerated exercise well with an average MET level of 1.4. He is off to a good start and is learning his THRR, RPE and ExRx REVD MET's and goals REVD MET's and home ExRx.  Pt tolerated exercise  well with an average MET level of 1.8. Patient is doing well, no significant changes with MET's, but he's doing well with exercise, working on trying to set SPM goals and WL increases as able. He will add in exercise on his own by walking around his house (with a cane and cell phone) and chair fitness classes on youtube for 30-45 min (broken up into 10 min bouts as needed) for 1-2 days. Will provide exercise classes at graduation that may be helpful for him REVD MET's.  Pt tolerated exercise well with an average MET level of 1.6. Repetitvely encouraged INC SPM over the last week of sessions, but patient unable to remeber between sessions what we reviewed prior. Will continue to encourage progress as able. Reviewed MET's and goals. Pt tolerated exercise well with an average MET level of 1.7. Continueing to encourage INC SPM to increase stamina overall. He is able to do come general house work, but does have a cleaner cone to help with some of the bigger tasks. His SOB has become much more manageable.      Exercise Goals and Review:   Exercise Goals     Row Name 02/16/24 1600             Exercise Goals   Increase Physical Activity Yes       Intervention Develop an individualized exercise prescription for aerobic and resistive training based on initial evaluation findings, risk stratification, comorbidities and participant's personal goals.;Provide advice, education, support and counseling about physical activity/exercise needs.       Expected Outcomes Long Term: Exercising regularly at least 3-5 days a week.;Short Term: Attend rehab on a regular basis to increase amount of physical activity.;Long Term: Add in home exercise to make exercise part of routine and to increase amount of physical activity.       Increase Strength and Stamina Yes       Intervention Provide advice, education, support and counseling about physical activity/exercise needs.;Develop an individualized exercise prescription for  aerobic and resistive training based on initial evaluation findings, risk stratification, comorbidities and participant's personal goals.       Expected Outcomes Short Term: Increase workloads from initial exercise prescription for resistance, speed, and METs.;Short Term: Perform resistance training exercises routinely during rehab and add in resistance training at home;Long Term: Improve cardiorespiratory fitness, muscular endurance and strength as measured by increased METs and functional capacity ( )       Able to understand and use rate of perceived exertion (RPE) scale Yes  Intervention Provide education and explanation on how to use RPE scale       Expected Outcomes Short Term: Able to use RPE daily in rehab to express subjective intensity level;Long Term:  Able to use RPE to guide intensity level when exercising independently       Knowledge and understanding of Target Heart Rate Range (THRR) Yes       Intervention Provide education and explanation of THRR including how the numbers were predicted and where they are located for reference       Expected Outcomes Short Term: Able to state/look up THRR;Long Term: Able to use THRR to govern intensity when exercising independently;Short Term: Able to use daily as guideline for intensity in rehab       Understanding of Exercise Prescription Yes       Intervention Provide education, explanation, and written materials on patient's individual exercise prescription       Expected Outcomes Short Term: Able to explain program exercise prescription;Long Term: Able to explain home exercise prescription to exercise independently          Exercise Goals Re-Evaluation :  Exercise Goals Re-Evaluation     Row Name 02/20/24 1647 03/08/24 0934 03/19/24 1649 04/09/24 1646       Exercise Goal Re-Evaluation   Exercise Goals Review Increase Physical Activity;Understanding of Exercise Prescription;Increase Strength and Stamina;Knowledge and understanding of  Target Heart Rate Range (THRR);Able to understand and use rate of perceived exertion (RPE) scale Increase Physical Activity;Understanding of Exercise Prescription;Increase Strength and Stamina;Knowledge and understanding of Target Heart Rate Range (THRR);Able to understand and use rate of perceived exertion (RPE) scale Increase Physical Activity;Understanding of Exercise Prescription;Increase Strength and Stamina;Knowledge and understanding of Target Heart Rate Range (THRR);Able to understand and use rate of perceived exertion (RPE) scale Increase Physical Activity;Understanding of Exercise Prescription;Increase Strength and Stamina;Knowledge and understanding of Target Heart Rate Range (THRR);Able to understand and use rate of perceived exertion (RPE) scale    Comments Pt first day in the [facility] ICR program. Pt tolerated exercise well with an average MET level of 1.4.  He is off to a good start and is learning his THRR, RPE and ExRx Reviewed MET's and goals. Pt tolerated exercise well with an average MET level of 1.7.  Patient is doing well and making gradual progression, talked about a WL increase next week. So far, he does not feel a change in his goals, but encouraged we are early on and he has increased his time from 25 to 30 mins and increased MET's. Will work on gradual progression overtime Reviewed MET's and goals. Pt tolerated exercise well with an average MET level of 1.7.  Patient is doing well and making gradual progression, talked about a WL increase next week. So far, he does not feel a change in his goals, but encouraged we are early on and he has increased his time from 25 to 30 mins and increased MET's. Will work on gradual progression overtime Reviewed MET's and goals. Pt tolerated exercise well with an average MET level of 1.7.  Continueing to encourage INC SPM to increase stamina overall. He is able to do come general house work, but does have a cleaner cone to help with some of the bigger  tasks. His SOB has become much more manageable.    Expected Outcomes Will continue to monitor pt and progress workloads as tolerated without sign or symptom Will continue to monitor pt and progress workloads as tolerated without sign or symptom Will continue  to monitor pt and progress workloads as tolerated without sign or symptom Will continue to monitor pt and progress workloads as tolerated without sign or symptom       Discharge Exercise Prescription (Final Exercise Prescription Changes):  Exercise Prescription Changes - 04/09/24 1643       Response to Exercise   Blood Pressure (Admit) 122/80    Blood Pressure (Exit) 108/62    Heart Rate (Admit) 75 bpm    Heart Rate (Exercise) 121 bpm    Heart Rate (Exit) 72 bpm    Rating of Perceived Exertion (Exercise) 12    Perceived Dyspnea (Exercise) 0    Symptoms 0    Comments Reviewed MET's and goals    Duration Progress to 30 minutes of  aerobic without signs/symptoms of physical distress    Intensity THRR unchanged      Progression   Progression Continue to progress workloads to maintain intensity without signs/symptoms of physical distress.    Average METs 1.7      Resistance Training   Training Prescription Yes    Weight 3    Reps 10-15    Time 10 Minutes      NuStep   Level 3    SPM 74    Minutes 30    METs 1.7      Home Exercise Plan   Plans to continue exercise at Home (comment)    Frequency Add 2 additional days to program exercise sessions.    Initial Home Exercises Provided 03/19/24          Nutrition:  Target Goals: Understanding of nutrition guidelines, daily intake of sodium 1500mg , cholesterol 200mg , calories 30% from fat and 7% or less from saturated fats, daily to have 5 or more servings of fruits and vegetables.  Biometrics:  Pre Biometrics - 02/16/24 1400       Pre Biometrics   Waist Circumference 45 inches    Hip Circumference 47 inches    Waist to Hip Ratio 0.96 %    Triceps Skinfold 26 mm     % Body Fat 33.9 %    Grip Strength 22 kg    Flexibility 0 in    Single Leg Stand 1 seconds           Nutrition Therapy Plan and Nutrition Goals:  Nutrition Therapy & Goals - 02/20/24 1542       Nutrition Therapy   Diet Heart Healthy Diet      Personal Nutrition Goals   Nutrition Goal Patient to identify strategies for reducing cardiovascular risk by attending the [facility] education and nutrition series weekly.    Personal Goal #2 Patient to improve diet quality by using the plate method as a guide for meal planning to include lean protein/plant protein, fruits, vegetables, whole grains, nonfat dairy as part of a well-balanced diet.    Comments Patient has medical history of RBBB, hyperlipidemia, PAH, HTN, Afib, CHF, Severe MR s/p MTEER, CKD3. A1c is in a diabetic range. Lipids from 2024 and have not been re-checked since starting zetia  08/2023; previously they were not at goal. Patient will benefit from participation in intensive cardiac rehab for nutrition education, exercise, and lifestyle modification.      Intervention Plan   Intervention Prescribe, educate and counsel regarding individualized specific dietary modifications aiming towards targeted core components such as weight, hypertension, lipid management, diabetes, heart failure and other comorbidities.;Nutrition handout(s) given to patient.    Expected Outcomes Short Term Goal: Understand basic principles of dietary  content, such as calories, fat, sodium, cholesterol and nutrients.;Long Term Goal: Adherence to prescribed nutrition plan.          Nutrition Assessments:  Nutrition Assessments - 02/27/24 1705       Rate Your Plate Scores   Pre Score 69         MEDIFICTS Score Key: >=70 Need to make dietary changes  40-70 Heart Healthy Diet <= 40 Therapeutic Level Cholesterol Diet   Flowsheet Row INTENSIVE CARDIAC REHAB from 02/27/2024 in [facility]  Picture  Your Plate Total Score on Admission 69   Picture Your Plate Scores: <59 Unhealthy dietary pattern with much room for improvement. 41-50 Dietary pattern unlikely to meet recommendations for good health and room for improvement. 51-60 More healthful dietary pattern, with some room for improvement.  >60 Healthy dietary pattern, although there may be some specific behaviors that could be improved.    Nutrition Goals Re-Evaluation:  Nutrition Goals Re-Evaluation     Row Name 02/20/24 1542             Goals   Current Weight 235 lb 3.7 oz (106.7 kg)       Comment A1c 7.4, cholesterol 213, triglycerides 215, LDL 133       Expected Outcome Patient has medical history of RBBB, hyperlipidemia, PAH, HTN, Afib, CHF, Severe MR s/p MTEER, CKD3. A1c is in a diabetic range. Lipids from 2024 and have not been re-checked since starting zetia  08/2023; previously they were not at goal. Patient will benefit from participation in intensive cardiac rehab for nutrition education, exercise, and lifestyle modification.          Nutrition Goals Re-Evaluation:  Nutrition Goals Re-Evaluation     Row Name 02/20/24 1542             Goals   Current Weight 235 lb 3.7 oz (106.7 kg)       Comment A1c 7.4, cholesterol 213, triglycerides 215, LDL 133       Expected Outcome Patient has medical history of RBBB, hyperlipidemia, PAH, HTN, Afib, CHF, Severe MR s/p MTEER, CKD3. A1c is in a diabetic range. Lipids from 2024 and have not been re-checked since starting zetia  08/2023; previously they were not at goal. Patient will benefit from participation in intensive cardiac rehab for nutrition education, exercise, and lifestyle modification.          Nutrition Goals Discharge (Final Nutrition Goals Re-Evaluation):  Nutrition Goals Re-Evaluation - 02/20/24 1542       Goals   Current Weight 235 lb 3.7 oz (106.7 kg)    Comment A1c 7.4, cholesterol 213, triglycerides 215, LDL 133    Expected Outcome Patient has medical  history of RBBB, hyperlipidemia, PAH, HTN, Afib, CHF, Severe MR s/p MTEER, CKD3. A1c is in a diabetic range. Lipids from 2024 and have not been re-checked since starting zetia  08/2023; previously they were not at goal. Patient will benefit from participation in intensive cardiac rehab for nutrition education, exercise, and lifestyle modification.          Psychosocial: Target Goals: Acknowledge presence or absence of significant depression and/or stress, maximize coping skills, provide positive support system. Participant is able to verbalize types and ability to use techniques and skills needed for reducing stress and depression.  Initial Review & Psychosocial Screening:  Initial Psych Review & Screening - 02/16/24 1504       Initial Review   Current issues with Current Sleep Concerns;Current  Stress Concerns;Current Depression    Source of Stress Concerns Unable to perform yard/household activities;Unable to participate in former interests or hobbies    Comments Jaquis says he sometimes feels depressed. Cari is not on an antidepressant and is not interested in counseling at this time.  Kourtland says he does not sleep well at night due to frequent urination. Discussed sleep hygiene. Recommended trying chamomile tea. Yuan says he has tried melatonin in the past without sucess. I encouraged Malik to discuss with his PCP.      Family Dynamics   Good Support System? Yes   Kaysen lives alone. Doyt's ex wife lives in the area. Kallon has 2 daughters one lives in [place] and was affected by [place] the other daughter lives in [place].     Barriers   Psychosocial barriers to participate in program The patient should benefit from training in stress management and relaxation.      Screening Interventions   Interventions Encouraged to exercise;Provide feedback about the scores to participant    Expected Outcomes Long Term Goal: Stressors or current issues are controlled or eliminated.;Short Term goal:  Identification and review with participant of any Quality of Life or Depression concerns found by scoring the questionnaire.;Long Term goal: The participant improves quality of Life and PHQ9 Scores as seen by post scores and/or verbalization of changes          Quality of Life Scores:  Quality of Life - 02/16/24 1558       Quality of Life   Select Quality of Life      Quality of Life Scores   Health/Function Pre 22.29 %    Socioeconomic Pre 24.43 %    Psych/Spiritual Pre 22.5 %    Family Pre 27 %    GLOBAL Pre 22.95 %         Scores of 19 and below usually indicate a poorer quality of life in these areas.  A difference of  2-3 points is a clinically meaningful difference.  A difference of 2-3 points in the total score of the Quality of Life Index has been associated with significant improvement in overall quality of life, self-image, physical symptoms, and general health in studies assessing change in quality of life.  PHQ-9: Review Flowsheet       02/16/2024  Depression screen PHQ 2/9  Decreased Interest 2  Down, Depressed, Hopeless 2  PHQ - 2 Score 4  Altered sleeping 3  Tired, decreased energy 3  Change in appetite 1  Feeling bad or failure about yourself  0  Trouble concentrating 0  Moving slowly or fidgety/restless 0  Suicidal thoughts 0  PHQ-9 Score 11  Difficult doing work/chores Not difficult at all   Interpretation of Total Score  Total Score Depression Severity:  1-4 = Minimal depression, 5-9 = Mild depression, 10-14 = Moderate depression, 15-19 = Moderately severe depression, 20-27 = Severe depression   Psychosocial Evaluation and Intervention:   Psychosocial Re-Evaluation:  Psychosocial Re-Evaluation     Row Name 03/13/24 1538 04/11/24 0914           Psychosocial Re-Evaluation   Current issues with Current Stress Concerns;History of Depression;Current Sleep Concerns Current Stress Concerns;History of Depression;Current Sleep Concerns       Comments Reviewed PHQ9. Azrael admits to bieng depressed. Jamen is not taking any antidepressants at this time. Wendy says he has difficulty sleeping and has talked with his primary care provider Dr Onita regarding this. Andrik says he does not feel like  cooking meals at times. Hulon has not voiced any increased concerns or stressors during exercise at cardiac rehab      Expected Outcomes Ladarius will have controlled or decreased depression/ stressors  upon completion of cardiac rehab Edilson will have controlled or decreased depression/ stressors  upon completion of cardiac rehab      Interventions Stress management education;Relaxation education;Encouraged to attend Cardiac Rehabilitation for the exercise Stress management education;Relaxation education;Encouraged to attend Cardiac Rehabilitation for the exercise      Continue Psychosocial Services  Follow up required by staff No Follow up required        Initial Review   Source of Stress Concerns Occupation;Family;Financial Occupation;Family;Financial      Comments will continue to monitor and offer support as needed. will continue to monitor and offer support as needed.         Psychosocial Discharge (Final Psychosocial Re-Evaluation):  Psychosocial Re-Evaluation - 04/11/24 0914       Psychosocial Re-Evaluation   Current issues with Current Stress Concerns;History of Depression;Current Sleep Concerns    Comments Dene has not voiced any increased concerns or stressors during exercise at cardiac rehab    Expected Outcomes Casmere will have controlled or decreased depression/ stressors  upon completion of cardiac rehab    Interventions Stress management education;Relaxation education;Encouraged to attend Cardiac Rehabilitation for the exercise    Continue Psychosocial Services  No Follow up required      Initial Review   Source of Stress Concerns Occupation;Family;Financial    Comments will continue to monitor and offer support as needed.           Vocational Rehabilitation: Provide vocational rehab assistance to qualifying candidates.   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 Jourdyn does not need vocational rehab at this time.         Education: Education Goals: Education classes will be provided on a weekly basis, covering required topics. Participant will state understanding/return demonstration of topics presented.    Education     Row Name 02/20/24 1400     Education   Cardiac Education Topics Pritikin   Select Workshops     Workshops   Educator Exercise Physiologist   Select Psychosocial   Psychosocial Workshop Recognizing and Reducing Stress   Instruction Review Code 1- Verbalizes Understanding   Class Start Time 1405   Class Stop Time 1448   Class Time Calculation (min) 43 min    Row Name 02/22/24 1400     Education   Cardiac Education Topics Pritikin   Orthoptist   Educator Dietitian   Weekly Topic Delicious Desserts   Instruction Review Code 1- Verbalizes Understanding   Class Start Time 1400   Class Stop Time 1450   Class Time Calculation (min) 50 min    Row Name 02/24/24 1500     Education   Cardiac Education Topics Pritikin   Nurse, children's   Educator Dietitian   Select Nutrition   Nutrition Calorie Density   Instruction Review Code 1- Verbalizes Understanding   Class Start Time 1400   Class Stop Time 1435   Class Time Calculation (min) 35 min    Row Name 02/27/24 1400     Education   Cardiac  Education Topics Pritikin   Geographical information systems officer Exercise   Exercise Workshop Exercise Basics: Diplomatic Services operational officer   Instruction Review Code 1- Teaching laboratory technician Start Time 1420    Class Stop Time 1500   Class Time Calculation (min) 40 min    Row Name 02/29/24 1400     Education   Cardiac Education Topics Pritikin   Customer service manager   Weekly Topic Efficiency Cooking - Meals in a Snap   Instruction Review Code 1- Verbalizes Understanding   Class Start Time 1400   Class Stop Time 1440   Class Time Calculation (min) 40 min    Row Name 03/02/24 1400     Education   Cardiac Education Topics Pritikin   Psychologist, forensic Exercise Education   Exercise Education Move It!   Instruction Review Code 1- Verbalizes Understanding   Class Start Time 1359   Class Stop Time 1432   Class Time Calculation (min) 33 min    Row Name 03/07/24 1400     Education   Cardiac Education Topics Pritikin   Customer service manager   Weekly Topic One-Pot Wonders   Instruction Review Code 1- Verbalizes Understanding   Class Start Time 1400   Class Stop Time 1440   Class Time Calculation (min) 40 min    Row Name 03/12/24 1400     Education   Cardiac Education Topics Pritikin   Geographical information systems officer Psychosocial   Psychosocial Workshop Focused Goals, Sustainable Changes   Instruction Review Code 1- Verbalizes Understanding   Class Start Time 1359   Class Stop Time 1434   Class Time Calculation (min) 35 min    Row Name 03/14/24 1300     Education   Cardiac Education Topics Pritikin   Customer service manager   Weekly Topic Comforting Weekend Breakfasts   Instruction Review Code 1- Verbalizes Understanding   Class Start Time 1358   Class Stop Time 1439   Class Time Calculation (min) 41 min    Row Name 03/16/24 1400     Education   Cardiac Education Topics Pritikin   Nurse, children's Exercise  Physiologist   Select Nutrition   Nutrition Dining Out - Part 1   Instruction Review Code 1- Verbalizes Understanding   Class Start Time 1400   Class Stop Time 1440   Class Time Calculation (min) 40 min    Row Name 03/19/24 1400     Education   Cardiac Education Topics Pritikin   Select Core Videos     Core Videos   Educator Exercise Physiologist   Select Exercise Education   Exercise Education Biomechanial Limitations   Instruction Review Code 1- Verbalizes Understanding   Class Start Time 1405   Class Stop Time 1440   Class Time Calculation (min) 35 min    Row Name 03/21/24 1400     Education   Cardiac Education Topics Pritikin   Designer, jewellery   Weekly Topic Fast Evening  Meals   Instruction Review Code 1- Verbalizes Understanding   Class Start Time 1356   Class Stop Time 1428   Class Time Calculation (min) 32 min    Row Name 03/23/24 1400     Education   Cardiac Education Topics Pritikin   Select Core Videos     Core Videos   Educator Dietitian   Select Nutrition   Nutrition Vitamins and Minerals   Instruction Review Code 1- Verbalizes Understanding   Class Start Time 1357   Class Stop Time 1440   Class Time Calculation (min) 43 min    Row Name 03/26/24 1400     Education   Cardiac Education Topics Pritikin   Psychologist, forensic Exercise Education   Exercise Education Improving Performance   Instruction Review Code 1- Verbalizes Understanding   Class Start Time 1400   Class Stop Time 1438   Class Time Calculation (min) 38 min    Row Name 03/28/24 1500     Education   Cardiac Education Topics Pritikin   Customer service manager   Weekly Topic International Cuisine- Spotlight on the [employer] Zones   Instruction Review Code 1- Verbalizes Understanding   Class Start Time 1358   Class Stop  Time 1435   Class Time Calculation (min) 37 min    Row Name 03/30/24 1400     Education   Cardiac Education Topics Pritikin   Glass blower/designer Nutrition   Nutrition Workshop Fueling a Forensic psychologist   Instruction Review Code 1- Tax inspector   Class Start Time 1357   Class Stop Time 1433   Class Time Calculation (min) 36 min    Row Name 04/04/24 1400     Education   Cardiac Education Topics Pritikin   Set designer Dietitian;Respiratory Therapist   Weekly Topic Simple Sides and Sauces   Instruction Review Code 1- Verbalizes Understanding   Class Start Time 1400   Class Stop Time 1435   Class Time Calculation (min) 35 min    Row Name 04/06/24 1400     Education   Cardiac Education Topics Pritikin   Nurse, children's Exercise Physiologist   Select Psychosocial   Psychosocial How Our Thoughts Can Heal Our Hearts   Instruction Review Code 1- Verbalizes Understanding   Class Start Time 1400   Class Stop Time 1436   Class Time Calculation (min) 36 min    Row Name 04/09/24 1400     Education   Cardiac Education Topics Pritikin   Hospital doctor Education   General Education Heart Disease Risk Reduction   Instruction Review Code 1- Verbalizes Understanding   Class Start Time 1410   Class Stop Time 1450   Class Time Calculation (min) 40 min      Core Videos: Exercise    Move It!  Clinical staff conducted group or individual video education with verbal and written material and guidebook.  Patient learns the recommended Pritikin exercise program. Exercise with the goal of living a long, healthy life. Some of the health benefits of exercise include controlled diabetes, healthier blood pressure levels, improved  cholesterol levels, improved heart and lung capacity, improved  sleep, and better body composition. Everyone should speak with their doctor before starting or changing an exercise routine.  Biomechanical Limitations Clinical staff conducted group or individual video education with verbal and written material and guidebook.  Patient learns how biomechanical limitations can impact exercise and how we can mitigate and possibly overcome limitations to have an impactful and balanced exercise routine.  Body Composition Clinical staff conducted group or individual video education with verbal and written material and guidebook.  Patient learns that body composition (ratio of muscle mass to fat mass) is a key component to assessing overall fitness, rather than body weight alone. Increased fat mass, especially visceral belly fat, can put us  at increased risk for metabolic syndrome, type 2 diabetes, heart disease, and even death. It is recommended to combine diet and exercise (cardiovascular and resistance training) to improve your body composition. Seek guidance from your physician and exercise physiologist before implementing an exercise routine.  Exercise Action Plan Clinical staff conducted group or individual video education with verbal and written material and guidebook.  Patient learns the recommended strategies to achieve and enjoy long-term exercise adherence, including variety, self-motivation, self-efficacy, and positive decision making. Benefits of exercise include fitness, good health, weight management, more energy, better sleep, less stress, and overall well-being.  Medical   Heart Disease Risk Reduction Clinical staff conducted group or individual video education with verbal and written material and guidebook.  Patient learns our heart is our most vital organ as it circulates oxygen, nutrients, white blood cells, and hormones throughout the entire body, and carries waste away. Data supports a plant-based eating plan like the Pritikin Program for its  effectiveness in slowing progression of and reversing heart disease. The video provides a number of recommendations to address heart disease.   Metabolic Syndrome and Belly Fat  Clinical staff conducted group or individual video education with verbal and written material and guidebook.  Patient learns what metabolic syndrome is, how it leads to heart disease, and how one can reverse it and keep it from coming back. You have metabolic syndrome if you have 3 of the following 5 criteria: abdominal obesity, high blood pressure, high triglycerides, low HDL cholesterol, and high blood sugar.  Hypertension and Heart Disease Clinical staff conducted group or individual video education with verbal and written material and guidebook.  Patient learns that high blood pressure, or hypertension, is very common in the [place] . Hypertension is largely due to excessive salt intake, but other important risk factors include being overweight, physical inactivity, drinking too much alcohol , smoking, and not eating enough potassium from fruits and vegetables. High blood pressure is a leading risk factor for heart attack, stroke, congestive heart failure, dementia, kidney failure, and premature death. Long-term effects of excessive salt intake include stiffening of the arteries and thickening of heart muscle and organ damage. Recommendations include ways to reduce hypertension and the risk of heart disease.  Diseases of Our Time - Focusing on Diabetes Clinical staff conducted group or individual video education with verbal and written material and guidebook.  Patient learns why the best way to stop diseases of our time is prevention, through food and other lifestyle changes. Medicine (such as prescription pills and surgeries) is often only a Band-Aid on the problem, not a long-term solution. Most common diseases of our time include obesity, type 2 diabetes, hypertension, heart disease, and cancer. The Pritikin Program is  recommended and has been proven to help reduce, reverse,  and/or prevent the damaging effects of metabolic syndrome.  Nutrition   Overview of the Pritikin Eating Plan  Clinical staff conducted group or individual video education with verbal and written material and guidebook.  Patient learns about the Pritikin Eating Plan for disease risk reduction. The Pritikin Eating Plan emphasizes a wide variety of unrefined, minimally-processed carbohydrates, like fruits, vegetables, whole grains, and legumes. Go, Caution, and Stop food choices are explained. Plant-based and lean animal proteins are emphasized. Rationale provided for low sodium intake for blood pressure control, low added sugars for blood sugar stabilization, and low added fats and oils for coronary artery disease risk reduction and weight management.  Calorie Density  Clinical staff conducted group or individual video education with verbal and written material and guidebook.  Patient learns about calorie density and how it impacts the Pritikin Eating Plan. Knowing the characteristics of the food you choose will help you decide whether those foods will lead to weight gain or weight loss, and whether you want to consume more or less of them. Weight loss is usually a side effect of the Pritikin Eating Plan because of its focus on low calorie-dense foods.  Label Reading  Clinical staff conducted group or individual video education with verbal and written material and guidebook.  Patient learns about the Pritikin recommended label reading guidelines and corresponding recommendations regarding calorie density, added sugars, sodium content, and whole grains.  Dining Out - Part 1  Clinical staff conducted group or individual video education with verbal and written material and guidebook.  Patient learns that restaurant meals can be sabotaging because they can be so high in calories, fat, sodium, and/or sugar. Patient learns recommended strategies on  how to positively address this and avoid unhealthy pitfalls.  Facts on Fats  Clinical staff conducted group or individual video education with verbal and written material and guidebook.  Patient learns that lifestyle modifications can be just as effective, if not more so, as many medications for lowering your risk of heart disease. A Pritikin lifestyle can help to reduce your risk of inflammation and atherosclerosis (cholesterol build-up, or plaque, in the artery walls). Lifestyle interventions such as dietary choices and physical activity address the cause of atherosclerosis. A review of the types of fats and their impact on blood cholesterol levels, along with dietary recommendations to reduce fat intake is also included.  Nutrition Action Plan  Clinical staff conducted group or individual video education with verbal and written material and guidebook.  Patient learns how to incorporate Pritikin recommendations into their lifestyle. Recommendations include planning and keeping personal health goals in mind as an important part of their success.  Healthy Mind-Set    Healthy Minds, Bodies, Hearts  Clinical staff conducted group or individual video education with verbal and written material and guidebook.  Patient learns how to identify when they are stressed. Video will discuss the impact of that stress, as well as the many benefits of stress management. Patient will also be introduced to stress management techniques. The way we think, act, and feel has an impact on our hearts.  How Our Thoughts Can Heal Our Hearts  Clinical staff conducted group or individual video education with verbal and written material and guidebook.  Patient learns that negative thoughts can cause depression and anxiety. This can result in negative lifestyle behavior and serious health problems. Cognitive behavioral therapy is an effective method to help control our thoughts in order to change and improve our emotional  outlook.  Additional Videos:  Exercise  Improving Performance  Clinical staff conducted group or individual video education with verbal and written material and guidebook.  Patient learns to use a non-linear approach by alternating intensity levels and lengths of time spent exercising to help burn more calories and lose more body fat. Cardiovascular exercise helps improve heart health, metabolism, hormonal balance, blood sugar control, and recovery from fatigue. Resistance training improves strength, endurance, balance, coordination, reaction time, metabolism, and muscle mass. Flexibility exercise improves circulation, posture, and balance. Seek guidance from your physician and exercise physiologist before implementing an exercise routine and learn your capabilities and proper form for all exercise.  Introduction to Yoga  Clinical staff conducted group or individual video education with verbal and written material and guidebook.  Patient learns about yoga, a discipline of the coming together of mind, breath, and body. The benefits of yoga include improved flexibility, improved range of motion, better posture and core strength, increased lung function, weight loss, and positive self-image. Yoga's heart health benefits include lowered blood pressure, healthier heart rate, decreased cholesterol and triglyceride levels, improved immune function, and reduced stress. Seek guidance from your physician and exercise physiologist before implementing an exercise routine and learn your capabilities and proper form for all exercise.  Medical   Aging: Enhancing Your Quality of Life  Clinical staff conducted group or individual video education with verbal and written material and guidebook.  Patient learns key strategies and recommendations to stay in good physical health and enhance quality of life, such as prevention strategies, having an advocate, securing a Health Care Proxy and Power of Attorney, and keeping  a list of medications and system for tracking them. It also discusses how to avoid risk for bone loss.  Biology of Weight Control  Clinical staff conducted group or individual video education with verbal and written material and guidebook.  Patient learns that weight gain occurs because we consume more calories than we burn (eating more, moving less). Even if your body weight is normal, you may have higher ratios of fat compared to muscle mass. Too much body fat puts you at increased risk for cardiovascular disease, heart attack, stroke, type 2 diabetes, and obesity-related cancers. In addition to exercise, following the Pritikin Eating Plan can help reduce your risk.  Decoding Lab Results  Clinical staff conducted group or individual video education with verbal and written material and guidebook.  Patient learns that lab test reflects one measurement whose values change over time and are influenced by many factors, including medication, stress, sleep, exercise, food, hydration, pre-existing medical conditions, and more. It is recommended to use the knowledge from this video to become more involved with your lab results and evaluate your numbers to speak with your doctor.   Diseases of Our Time - Overview  Clinical staff conducted group or individual video education with verbal and written material and guidebook.  Patient learns that according to the CDC, 50% to 70% of chronic diseases (such as obesity, type 2 diabetes, elevated lipids, hypertension, and heart disease) are avoidable through lifestyle improvements including healthier food choices, listening to satiety cues, and increased physical activity.  Sleep Disorders Clinical staff conducted group or individual video education with verbal and written material and guidebook.  Patient learns how good quality and duration of sleep are important to overall health and well-being. Patient also learns about sleep disorders and how they impact health  along with recommendations to address them, including discussing with a physician.  Nutrition  Dining Out - Part 2 Clinical staff conducted  group or individual video education with verbal and written material and guidebook.  Patient learns how to plan ahead and communicate in order to maximize their dining experience in a healthy and nutritious manner. Included are recommended food choices based on the type of restaurant the patient is visiting.   Fueling a Banker conducted group or individual video education with verbal and written material and guidebook.  There is a strong connection between our food choices and our health. Diseases like obesity and type 2 diabetes are very prevalent and are in large-part due to lifestyle choices. The Pritikin Eating Plan provides plenty of food and hunger-curbing satisfaction. It is easy to follow, affordable, and helps reduce health risks.  Menu Workshop  Clinical staff conducted group or individual video education with verbal and written material and guidebook.  Patient learns that restaurant meals can sabotage health goals because they are often packed with calories, fat, sodium, and sugar. Recommendations include strategies to plan ahead and to communicate with the manager, chef, or server to help order a healthier meal.  Planning Your Eating Strategy  Clinical staff conducted group or individual video education with verbal and written material and guidebook.  Patient learns about the Pritikin Eating Plan and its benefit of reducing the risk of disease. The Pritikin Eating Plan does not focus on calories. Instead, it emphasizes high-quality, nutrient-rich foods. By knowing the characteristics of the foods, we choose, we can determine their calorie density and make informed decisions.  Targeting Your Nutrition Priorities  Clinical staff conducted group or individual video education with verbal and written material and guidebook.   Patient learns that lifestyle habits have a tremendous impact on disease risk and progression. This video provides eating and physical activity recommendations based on your personal health goals, such as reducing LDL cholesterol, losing weight, preventing or controlling type 2 diabetes, and reducing high blood pressure.  Vitamins and Minerals  Clinical staff conducted group or individual video education with verbal and written material and guidebook.  Patient learns different ways to obtain key vitamins and minerals, including through a recommended healthy diet. It is important to discuss all supplements you take with your doctor.   Healthy Mind-Set    Smoking Cessation  Clinical staff conducted group or individual video education with verbal and written material and guidebook.  Patient learns that cigarette smoking and tobacco addiction pose a serious health risk which affects millions of people. Stopping smoking will significantly reduce the risk of heart disease, lung disease, and many forms of cancer. Recommended strategies for quitting are covered, including working with your doctor to develop a successful plan.  Culinary   Becoming a Set designer conducted group or individual video education with verbal and written material and guidebook.  Patient learns that cooking at home can be healthy, cost-effective, quick, and puts them in control. Keys to cooking healthy recipes will include looking at your recipe, assessing your equipment needs, planning ahead, making it simple, choosing cost-effective seasonal ingredients, and limiting the use of added fats, salts, and sugars.  Cooking - Breakfast and Snacks  Clinical staff conducted group or individual video education with verbal and written material and guidebook.  Patient learns how important breakfast is to satiety and nutrition through the entire day. Recommendations include key foods to eat during breakfast to help  stabilize blood sugar levels and to prevent overeating at meals later in the day. Planning ahead is also a key component.  Cooking - Futures trader  and Sides  Clinical staff conducted group or individual video education with verbal and written material and guidebook.  Patient learns eating strategies to improve overall health, including an approach to cook more at home. Recommendations include thinking of animal protein as a side on your plate rather than center stage and focusing instead on lower calorie dense options like vegetables, fruits, whole grains, and plant-based proteins, such as beans. Making sauces in large quantities to freeze for later and leaving the skin on your vegetables are also recommended to maximize your experience.  Cooking - Healthy Salads and Dressing Clinical staff conducted group or individual video education with verbal and written material and guidebook.  Patient learns that vegetables, fruits, whole grains, and legumes are the foundations of the Pritikin Eating Plan. Recommendations include how to incorporate each of these in flavorful and healthy salads, and how to create homemade salad dressings. Proper handling of ingredients is also covered. Cooking - Soups and [employer] - Soups and Desserts Clinical staff conducted group or individual video education with verbal and written material and guidebook.  Patient learns that Pritikin soups and desserts make for easy, nutritious, and delicious snacks and meal components that are low in sodium, fat, sugar, and calorie density, while high in vitamins, minerals, and filling fiber. Recommendations include simple and healthy ideas for soups and desserts.   Overview     The Pritikin Solution Program Overview Clinical staff conducted group or individual video education with verbal and written material and guidebook.  Patient learns that the results of the Pritikin Program have been documented in more than 100 articles published  in peer-reviewed journals, and the benefits include reducing risk factors for (and, in some cases, even reversing) high cholesterol, high blood pressure, type 2 diabetes, obesity, and more! An overview of the three key pillars of the Pritikin Program will be covered: eating well, doing regular exercise, and having a healthy mind-set.  WORKSHOPS  Exercise: Exercise Basics: Building Your Action Plan Clinical staff led group instruction and group discussion with PowerPoint presentation and patient guidebook. To enhance the learning environment the use of posters, models and videos may be added. At the conclusion of this workshop, patients will comprehend the difference between physical activity and exercise, as well as the benefits of incorporating both, into their routine. Patients will understand the FITT (Frequency, Intensity, Time, and Type) principle and how to use it to build an exercise action plan. In addition, safety concerns and other considerations for exercise and cardiac rehab will be addressed by the presenter. The purpose of this lesson is to promote a comprehensive and effective weekly exercise routine in order to improve patients' overall level of fitness.   Managing Heart Disease: Your Path to a Healthier Heart Clinical staff led group instruction and group discussion with PowerPoint presentation and patient guidebook. To enhance the learning environment the use of posters, models and videos may be added.At the conclusion of this workshop, patients will understand the anatomy and physiology of the heart. Additionally, they will understand how Pritikin's three pillars impact the risk factors, the progression, and the management of heart disease.  The purpose of this lesson is to provide a high-level overview of the heart, heart disease, and how the Pritikin lifestyle positively impacts risk factors.  Exercise Biomechanics Clinical staff led group instruction and group discussion  with PowerPoint presentation and patient guidebook. To enhance the learning environment the use of posters, models and videos may be added. Patients will learn how the  structural parts of their bodies function and how these functions impact their daily activities, movement, and exercise. Patients will learn how to promote a neutral spine, learn how to manage pain, and identify ways to improve their physical movement in order to promote healthy living. The purpose of this lesson is to expose patients to common physical limitations that impact physical activity. Participants will learn practical ways to adapt and manage aches and pains, and to minimize their effect on regular exercise. Patients will learn how to maintain good posture while sitting, walking, and lifting.  Balance Training and Fall Prevention  Clinical staff led group instruction and group discussion with PowerPoint presentation and patient guidebook. To enhance the learning environment the use of posters, models and videos may be added. At the conclusion of this workshop, patients will understand the importance of their sensorimotor skills (vision, proprioception, and the vestibular system) in maintaining their ability to balance as they age. Patients will apply a variety of balancing exercises that are appropriate for their current level of function. Patients will understand the common causes for poor balance, possible solutions to these problems, and ways to modify their physical environment in order to minimize their fall risk. The purpose of this lesson is to teach patients about the importance of maintaining balance as they age and ways to minimize their risk of falling.  WORKSHOPS   Nutrition:  Fueling a Ship broker led group instruction and group discussion with PowerPoint presentation and patient guidebook. To enhance the learning environment the use of posters, models and videos may be added. Patients will  review the foundational principles of the Pritikin Eating Plan and understand what constitutes a serving size in each of the food groups. Patients will also learn Pritikin-friendly foods that are better choices when away from home and review make-ahead meal and snack options. Calorie density will be reviewed and applied to three nutrition priorities: weight maintenance, weight loss, and weight gain. The purpose of this lesson is to reinforce (in a group setting) the key concepts around what patients are recommended to eat and how to apply these guidelines when away from home by planning and selecting Pritikin-friendly options. Patients will understand how calorie density may be adjusted for different weight management goals.  Mindful Eating  Clinical staff led group instruction and group discussion with PowerPoint presentation and patient guidebook. To enhance the learning environment the use of posters, models and videos may be added. Patients will briefly review the concepts of the Pritikin Eating Plan and the importance of low-calorie dense foods. The concept of mindful eating will be introduced as well as the importance of paying attention to internal hunger signals. Triggers for non-hunger eating and techniques for dealing with triggers will be explored. The purpose of this lesson is to provide patients with the opportunity to review the basic principles of the Pritikin Eating Plan, discuss the value of eating mindfully and how to measure internal cues of hunger and fullness using the Hunger Scale. Patients will also discuss reasons for non-hunger eating and learn strategies to use for controlling emotional eating.  Targeting Your Nutrition Priorities Clinical staff led group instruction and group discussion with PowerPoint presentation and patient guidebook. To enhance the learning environment the use of posters, models and videos may be added. Patients will learn how to determine their genetic  susceptibility to disease by reviewing their family history. Patients will gain insight into the importance of diet as part of an overall healthy lifestyle in mitigating the  impact of genetics and other environmental insults. The purpose of this lesson is to provide patients with the opportunity to assess their personal nutrition priorities by looking at their family history, their own health history and current risk factors. Patients will also be able to discuss ways of prioritizing and modifying the Pritikin Eating Plan for their highest risk areas  Menu  Clinical staff led group instruction and group discussion with PowerPoint presentation and patient guidebook. To enhance the learning environment the use of posters, models and videos may be added. Using menus brought in from [employer], or printed from [employer], patients will apply the Pritikin dining out guidelines that were presented in the [employer] video. Patients will also be able to practice these guidelines in a variety of provided scenarios. The purpose of this lesson is to provide patients with the opportunity to practice hands-on learning of the Pritikin Dining Out guidelines with actual menus and practice scenarios.  Label Reading Clinical staff led group instruction and group discussion with PowerPoint presentation and patient guidebook. To enhance the learning environment the use of posters, models and videos may be added. Patients will review and discuss the Pritikin label reading guidelines presented in Pritikin's Label Reading Educational series video. Using fool labels brought in from local grocery stores and markets, patients will apply the label reading guidelines and determine if the packaged food meet the Pritikin guidelines. The purpose of this lesson is to provide patients with the opportunity to review, discuss, and practice hands-on learning of the Pritikin Label Reading guidelines with actual  packaged food labels. Cooking School  Pritikin's [employer] are designed to teach patients ways to prepare quick, simple, and affordable recipes at home. The importance of nutrition's role in chronic disease risk reduction is reflected in its emphasis in the overall Pritikin program. By learning how to prepare essential core Pritikin Eating Plan recipes, patients will increase control over what they eat; be able to customize the flavor of foods without the use of added salt, sugar, or fat; and improve the quality of the food they consume. By learning a set of core recipes which are easily assembled, quickly prepared, and affordable, patients are more likely to prepare more healthy foods at home. These workshops focus on convenient breakfasts, simple entres, side dishes, and desserts which can be prepared with minimal effort and are consistent with nutrition recommendations for cardiovascular risk reduction. Cooking [employer] are taught by a Armed forces logistics/support/administrative officer (RD) who has been trained by the [employer]. The chef or RD has a clear understanding of the importance of minimizing - if not completely eliminating - added fat, sugar, and sodium in recipes. Throughout the series of Cooking School Workshop sessions, patients will learn about healthy ingredients and efficient methods of cooking to build confidence in their capability to prepare    Cooking School weekly topics:  Adding Flavor- Sodium-Free  Fast and Healthy Breakfasts  Powerhouse Plant-Based Proteins  Satisfying Salads and Dressings  Simple Sides and Sauces  International Cuisine-Spotlight on the [employer] Zones  Delicious Desserts  Savory Soups  [employer] - Meals in a Astronomer Appetizers and Snacks  Comforting Weekend Breakfasts  One-Pot Wonders   Fast Evening Meals  Landscape architect Your Pritikin Plate  WORKSHOPS   Healthy Mindset (Psychosocial):  Focused Goals,  Sustainable Changes Clinical staff led group instruction and group discussion with PowerPoint presentation and patient guidebook. To enhance the learning environment the  use of posters, models and videos may be added. Patients will be able to apply effective goal setting strategies to establish at least one personal goal, and then take consistent, meaningful action toward that goal. They will learn to identify common barriers to achieving personal goals and develop strategies to overcome them. Patients will also gain an understanding of how our mind-set can impact our ability to achieve goals and the importance of cultivating a positive and growth-oriented mind-set. The purpose of this lesson is to provide patients with a deeper understanding of how to set and achieve personal goals, as well as the tools and strategies needed to overcome common obstacles which may arise along the way.  From Head to Heart: The Power of a Healthy Outlook  Clinical staff led group instruction and group discussion with PowerPoint presentation and patient guidebook. To enhance the learning environment the use of posters, models and videos may be added. Patients will be able to recognize and describe the impact of emotions and mood on physical health. They will discover the importance of self-care and explore self-care practices which may work for them. Patients will also learn how to utilize the 4 C's to cultivate a healthier outlook and better manage stress and challenges. The purpose of this lesson is to demonstrate to patients how a healthy outlook is an essential part of maintaining good health, especially as they continue their cardiac rehab journey.  Healthy Sleep for a Healthy Heart Clinical staff led group instruction and group discussion with PowerPoint presentation and patient guidebook. To enhance the learning environment the use of posters, models and videos may be added. At the conclusion of this workshop, patients  will be able to demonstrate knowledge of the importance of sleep to overall health, well-being, and quality of life. They will understand the symptoms of, and treatments for, common sleep disorders. Patients will also be able to identify daytime and nighttime behaviors which impact sleep, and they will be able to apply these tools to help manage sleep-related challenges. The purpose of this lesson is to provide patients with a general overview of sleep and outline the importance of quality sleep. Patients will learn about a few of the most common sleep disorders. Patients will also be introduced to the concept of "sleep hygiene," and discover ways to self-manage certain sleeping problems through simple daily behavior changes. Finally, the workshop will motivate patients by clarifying the links between quality sleep and their goals of heart-healthy living.   Recognizing and Reducing Stress Clinical staff led group instruction and group discussion with PowerPoint presentation and patient guidebook. To enhance the learning environment the use of posters, models and videos may be added. At the conclusion of this workshop, patients will be able to understand the types of stress reactions, differentiate between acute and chronic stress, and recognize the impact that chronic stress has on their health. They will also be able to apply different coping mechanisms, such as reframing negative self-talk. Patients will have the opportunity to practice a variety of stress management techniques, such as deep abdominal breathing, progressive muscle relaxation, and/or guided imagery.  The purpose of this lesson is to educate patients on the role of stress in their lives and to provide healthy techniques for coping with it.  Learning Barriers/Preferences:  Learning Barriers/Preferences - 02/16/24 1600       Learning Barriers/Preferences   Learning Barriers Hearing;Sight    Learning Preferences Audio;Computer/Internet;Group  Instruction;Skilled Demonstration;Verbal Instruction;Video;Written Material;Individual Instruction;Pictoral  Education Topics:  Knowledge Questionnaire Score:  Knowledge Questionnaire Score - 02/16/24 1558       Knowledge Questionnaire Score   Pre Score 20/24          Core Components/Risk Factors/Patient Goals at Admission:  Personal Goals and Risk Factors at Admission - 02/16/24 1601       Core Components/Risk Factors/Patient Goals on Admission    Weight Management Yes;Obesity    Intervention Weight Management: Provide education and appropriate resources to help participant work on and attain dietary goals.;Weight Management: Develop a combined nutrition and exercise program designed to reach desired caloric intake, while maintaining appropriate intake of nutrient and fiber, sodium and fats, and appropriate energy expenditure required for the weight goal.;Weight Management/Obesity: Establish reasonable short term and long term weight goals.;Obesity: Provide education and appropriate resources to help participant work on and attain dietary goals.    Expected Outcomes Long Term: Adherence to nutrition and physical activity/exercise program aimed toward attainment of established weight goal;Short Term: Continue to assess and modify interventions until short term weight is achieved;Understanding recommendations for meals to include 15-35% energy as protein, 25-35% energy from fat, 35-60% energy from carbohydrates, less than 200mg  of dietary cholesterol, 20-35 gm of total fiber daily;Understanding of distribution of calorie intake throughout the day with the consumption of 4-5 meals/snacks    Diabetes Yes    Intervention Provide education about signs/symptoms and action to take for hypo/hyperglycemia.;Provide education about proper nutrition, including hydration, and aerobic/resistive exercise prescription along with prescribed medications to achieve blood glucose in normal ranges:  Fasting glucose 65-99 mg/dL    Expected Outcomes Short Term: Participant verbalizes understanding of the signs/symptoms and immediate care of hyper/hypoglycemia, proper foot care and importance of medication, aerobic/resistive exercise and nutrition plan for blood glucose control.;Long Term: Attainment of HbA1C < 7%.    Heart Failure Yes    Intervention Provide a combined exercise and nutrition program that is supplemented with education, support and counseling about heart failure. Directed toward relieving symptoms such as shortness of breath, decreased exercise tolerance, and extremity edema.    Expected Outcomes Improve functional capacity of life;Short term: Attendance in program 2-3 days a week with increased exercise capacity. Reported lower sodium intake. Reported increased fruit and vegetable intake. Reports medication compliance.;Short term: Daily weights obtained and reported for increase. Utilizing diuretic protocols set by physician.;Long term: Adoption of self-care skills and reduction of barriers for early signs and symptoms recognition and intervention leading to self-care maintenance.    Hypertension Yes    Intervention Provide education on lifestyle modifcations including regular physical activity/exercise, weight management, moderate sodium restriction and increased consumption of fresh fruit, vegetables, and low fat dairy, alcohol  moderation, and smoking cessation.;Monitor prescription use compliance.    Expected Outcomes Short Term: Continued assessment and intervention until BP is < 140/57mm HG in hypertensive participants. < 130/55mm HG in hypertensive participants with diabetes, heart failure or chronic kidney disease.;Long Term: Maintenance of blood pressure at goal levels.    Lipids Yes    Intervention Provide education and support for participant on nutrition & aerobic/resistive exercise along with prescribed medications to achieve LDL 70mg , HDL >40mg .    Expected Outcomes Short  Term: Participant states understanding of desired cholesterol values and is compliant with medications prescribed. Participant is following exercise prescription and nutrition guidelines.;Long Term: Cholesterol controlled with medications as prescribed, with individualized exercise RX and with personalized nutrition plan. Value goals: LDL < 70mg , HDL > 40 mg.    Stress Yes  Intervention Offer individual and/or small group education and counseling on adjustment to heart disease, stress management and health-related lifestyle change. Teach and support self-help strategies.;Refer participants experiencing significant psychosocial distress to appropriate mental health specialists for further evaluation and treatment. When possible, include family members and significant others in education/counseling sessions.    Expected Outcomes Long Term: Emotional wellbeing is indicated by absence of clinically significant psychosocial distress or social isolation.;Short Term: Participant demonstrates changes in health-related behavior, relaxation and other stress management skills, ability to obtain effective social support, and compliance with psychotropic medications if prescribed.          Core Components/Risk Factors/Patient Goals Review:   Goals and Risk Factor Review     Row Name 03/13/24 1543 04/11/24 0916           Core Components/Risk Factors/Patient Goals Review   Personal Goals Review Weight Management/Obesity;Heart Failure;Lipids;Diabetes;Hypertension Weight Management/Obesity;Heart Failure;Lipids;Diabetes;Hypertension      Review Jahsiah is doing fair with exercise for his fitness level. Vital signs have been stable. No further ventricular ectopy noted during exercise since 02/24/24 Brode continues to do well with exercise for his fitness level. Vital signs remain stable. Axel has gained 1.3 kg since starting the program      Expected Outcomes Axle will continue to particpate in cardiac rehab for  exercise, nutrition and lifestyle modifications Foster will continue to particpate in cardiac rehab for exercise, nutrition and lifestyle modifications         Core Components/Risk Factors/Patient Goals at Discharge (Final Review):   Goals and Risk Factor Review - 04/11/24 0916       Core Components/Risk Factors/Patient Goals Review   Personal Goals Review Weight Management/Obesity;Heart Failure;Lipids;Diabetes;Hypertension    Review Jamaris continues to do well with exercise for his fitness level. Vital signs remain stable. Montague has gained 1.3 kg since starting the program    Expected Outcomes Matteus will continue to particpate in cardiac rehab for exercise, nutrition and lifestyle modifications          ITP Comments:  ITP Comments     Row Name 02/16/24 1316 03/13/24 1536 04/11/24 0914       ITP Comments Dr. Wilbert Bihari medical director. Introduction to Pritikin education [facility] cardiac rehab. Initial orientation packet reviewed with patient 30 Day ITP Review. Willaim has good participation for his fitness level at cardiac rehab. Boy remains somewhat deconditioned. 30 Day ITP Review. Antwaun continues to have good participation  with exercise for his fitness level at cardiac rehab. Donivan remains somewhat deconditioned.        Comments: See ITP comments.Hadassah Elpidio Quan RN BSN

## 2024-04-13 ENCOUNTER — Encounter (HOSPITAL_COMMUNITY)
Admission: RE | Admit: 2024-04-13 | Discharge: 2024-04-13 | Disposition: A | Discharge status: Home / Self Care | Source: Ambulatory Visit | Attending: Cardiovascular Disease | Admitting: Cardiovascular Disease

## 2024-04-13 DIAGNOSIS — Z955 Presence of coronary angioplasty implant and graft: Secondary | ICD-10-CM

## 2024-04-13 DIAGNOSIS — I214 Non-ST elevation (NSTEMI) myocardial infarction: Secondary | ICD-10-CM | POA: Diagnosis not present

## 2024-04-16 ENCOUNTER — Encounter (HOSPITAL_COMMUNITY): Admission: RE | Admit: 2024-04-16

## 2024-04-18 ENCOUNTER — Encounter (HOSPITAL_COMMUNITY)
Admission: RE | Admit: 2024-04-18 | Discharge: 2024-04-18 | Disposition: A | Discharge status: Home / Self Care | Source: Ambulatory Visit | Attending: Cardiovascular Disease | Admitting: Cardiovascular Disease

## 2024-04-18 DIAGNOSIS — Z955 Presence of coronary angioplasty implant and graft: Secondary | ICD-10-CM

## 2024-04-18 DIAGNOSIS — I214 Non-ST elevation (NSTEMI) myocardial infarction: Secondary | ICD-10-CM

## 2024-04-20 ENCOUNTER — Encounter (HOSPITAL_COMMUNITY)
Admission: RE | Admit: 2024-04-20 | Discharge: 2024-04-20 | Disposition: A | Discharge status: Home / Self Care | Source: Ambulatory Visit | Attending: Cardiovascular Disease | Admitting: Cardiovascular Disease

## 2024-04-20 DIAGNOSIS — Z955 Presence of coronary angioplasty implant and graft: Secondary | ICD-10-CM

## 2024-04-20 DIAGNOSIS — I214 Non-ST elevation (NSTEMI) myocardial infarction: Secondary | ICD-10-CM

## 2024-04-23 ENCOUNTER — Encounter (HOSPITAL_COMMUNITY): Admission: RE | Admit: 2024-04-23

## 2024-04-25 ENCOUNTER — Encounter (HOSPITAL_COMMUNITY)
Admission: RE | Admit: 2024-04-25 | Discharge: 2024-04-25 | Disposition: A | Discharge status: Home / Self Care | Source: Ambulatory Visit | Attending: Cardiovascular Disease | Admitting: Cardiovascular Disease

## 2024-04-25 DIAGNOSIS — I214 Non-ST elevation (NSTEMI) myocardial infarction: Secondary | ICD-10-CM

## 2024-04-25 DIAGNOSIS — Z955 Presence of coronary angioplasty implant and graft: Secondary | ICD-10-CM

## 2024-04-27 ENCOUNTER — Encounter (HOSPITAL_COMMUNITY): Admission: RE | Admit: 2024-04-27 | Source: Ambulatory Visit

## 2024-04-30 ENCOUNTER — Encounter (HOSPITAL_COMMUNITY)
Admission: RE | Admit: 2024-04-30 | Discharge: 2024-04-30 | Disposition: A | Discharge status: Home / Self Care | Source: Ambulatory Visit | Attending: Cardiovascular Disease | Admitting: Cardiovascular Disease

## 2024-04-30 DIAGNOSIS — I214 Non-ST elevation (NSTEMI) myocardial infarction: Secondary | ICD-10-CM

## 2024-04-30 DIAGNOSIS — Z955 Presence of coronary angioplasty implant and graft: Secondary | ICD-10-CM

## 2024-05-02 ENCOUNTER — Encounter (HOSPITAL_COMMUNITY)

## 2024-05-04 ENCOUNTER — Encounter (HOSPITAL_COMMUNITY)

## 2024-05-07 ENCOUNTER — Encounter (HOSPITAL_COMMUNITY)
Admission: RE | Admit: 2024-05-07 | Discharge: 2024-05-07 | Disposition: A | Discharge status: Home / Self Care | Source: Ambulatory Visit | Attending: Cardiovascular Disease | Admitting: Cardiovascular Disease

## 2024-05-07 VITALS — Ht 72.0 in | Wt 232.6 lb

## 2024-05-07 DIAGNOSIS — Z955 Presence of coronary angioplasty implant and graft: Secondary | ICD-10-CM | POA: Diagnosis not present

## 2024-05-07 DIAGNOSIS — I214 Non-ST elevation (NSTEMI) myocardial infarction: Secondary | ICD-10-CM | POA: Diagnosis present

## 2024-05-08 NOTE — Progress Notes (Signed)
 Cardiac Individual Treatment Plan  Patient Details  Name: [name] MRN: [id number] Date of Birth: 1935/04/27 Referring Provider:   Flowsheet Row INTENSIVE CARDIAC REHAB ORIENT from 02/16/2024 in [facility]  Referring Provider Ozell Fell, MD    Initial Encounter Date:  Flowsheet Row INTENSIVE CARDIAC REHAB ORIENT from 02/16/2024 in [facility]  Date 02/16/24    Visit Diagnosis: NSTEMI (non-ST elevated myocardial infarction) [facility])  Status post coronary artery stent placement  Patient's Home Medications on Admission:  Current Outpatient Medications:    amoxicillin  (AMOXIL ) 500 MG tablet, Take 4 tablets (2,000 mg total) by mouth as directed. 1 hour prior to dental work including cleanings, Disp: 12 tablet, Rfl: 12   apixaban  (ELIQUIS ) 5 MG TABS tablet, Take 1 tablet (5 mg total) by mouth 2 (two) times daily., Disp: 180 tablet, Rfl: 2   ascorbic acid (VITAMIN C) 500 MG tablet, Take 500 mg by mouth daily., Disp: , Rfl:    Carboxymethylcellulose Sodium (LUBRICANT EYE DROPS OP), Place 1 drop into both eyes daily as needed (dry eyes)., Disp: , Rfl:    carvedilol  (COREG ) 3.125 MG tablet, Take 1 tablet (3.125 mg total) by mouth 2 (two) times daily with a meal., Disp: 180 tablet, Rfl: 3   Cholecalciferol (VITAMIN D) 50 MCG (2000 UT) tablet, Take 2,000 Units by mouth daily., Disp: , Rfl:    Cyanocobalamin (B-12) 2500 MCG TABS, Take 2,500 mcg by mouth daily., Disp: , Rfl:    furosemide  (LASIX ) 20 MG tablet, Take 2 tablets (40 mg total) by mouth daily., Disp: 90 tablet, Rfl: 3   sacubitril -valsartan  (ENTRESTO ) 24-26 MG, Take 1 tablet by mouth 2 (two) times daily., Disp: 180 tablet, Rfl: 2  Past Medical History: Past Medical History:  Diagnosis Date   (HFimpEF) heart failure with improved EF    Echo 04/2019: EF 35-40 // Echo 5/22: EF 55-60, no RWMA, GR 1 DD, moderate to  severe MR, myxomatous mitral valve, similar to prior echocardiogram, mildly elevated PASP, RVSP 35.9, mild AI, mild AV sclerosis without AS, mild dilation of ascending aorta (43 mm)   A-fib (HCC)    RVR-DCCV   Abnormal PSA 01/17/2012   10.38/4/5/ PSA:2013=6.96/ PSA 07/08/2011=6.26/ PSA 2006=8.6   Ascending aorta dilation 09/16/2021   Echocardiogram March 2023: 41 mm   Blood transfusion without reported diagnosis    during vagotomy 1976   CAD (coronary artery disease)    Chronic kidney disease    nephrolithiasis right kidney   Coronary artery disease    ED (erectile dysfunction)    GERD (gastroesophageal reflux disease)    PUD S/P PARTIAL VAGOTOMY   Gynecomastia 04/21/2011   seen by Dr.Murray, no rad tx   H/O impacted cerumen    S/P ENT DR BATES   History of echocardiogram    echo 4/16:  mild LVH, EF 55-60%, Gr 1 DD, no RWMA, mild AI, mild MR, mod LAE, mild TR   Hydroureteronephrosis    right    Hyperlipidemia    Hypertension    Insomnia    Migraines    WITH AURA   Nephrolithiasis    Osteopenia    Overweight    Prostate cancer (HCC) 12/09/03 dx   Prostate ca/adenocarcinoma,gleason=3+3=6    pSA 4.6   RBBB (right bundle branch block)    Rosacea    S/P mitral valve clip implantation 12/22/2023   s/p Mitraclip NTW x 2 with  a reduction from 3-4+ MR to 1 + residual MR.   Sinus bradycardia    Sleep apnea    pt unaware, no sleep study   Stroke [facility])    asymptomatic, found on head imaging    Tobacco Use: Social History   Tobacco Use  Smoking Status Former   Types: Pipe   Quit date: 07/05/1984   Years since quitting: 39.8  Smokeless Tobacco Never    Labs: Review Flowsheet  More data exists      Latest Ref Rng & Units 04/25/2019 11/15/2022 08/30/2023 11/14/2023 12/21/2023  Labs for ITP Cardiac and Pulmonary Rehab  Cholestrol 100 - 199 mg/dL - 786  - - -  LDL (calc) 0 - 99 mg/dL - 866  - - -  HDL-C >60 mg/dL - 42  - - -  Trlycerides 0 - 149 mg/dL - 784  - - -  Hemoglobin  A1c 4.8 - 5.6 % - - 7.4  - -  PH, Arterial 7.35 - 7.45 7.411  - - 7.408  7.276   PCO2 arterial 32 - 48 mmHg 46.0  - - 38.7  53.4   Bicarbonate 20.0 - 28.0 mmol/L 32.0  29.5  29.2  - - 27.2  24.4  24.9   TCO2 22 - 32 mmol/L 34  31  31  - 23  29  26  26    Acid-base deficit 0.0 - 2.0 mmol/L - - - - 3.0   O2 Saturation % 76.0  77.0  98.0  - - 62  94  98     Details       Multiple values from one day are sorted in reverse-chronological order         Capillary Blood Glucose: Lab Results  Component Value Date   GLUCAP 133 (H) 02/22/2024   GLUCAP 165 (H) 02/22/2024   GLUCAP 146 (H) 02/20/2024   GLUCAP 218 (H) 02/20/2024   GLUCAP 158 (H) 12/21/2023     Exercise Target Goals: Exercise Program Goal: Individual exercise prescription set using results from initial 6 min walk test and THRR while considering  patient's activity barriers and safety.   Exercise Prescription Goal: Initial exercise prescription builds to 30-45 minutes a day of aerobic activity, 2-3 days per week.  Home exercise guidelines will be given to patient during program as part of exercise prescription that the participant will acknowledge.  Activity Barriers & Risk Stratification:  Activity Barriers & Cardiac Risk Stratification - 02/16/24 1559       Activity Barriers & Cardiac Risk Stratification   Activity Barriers Shortness of Breath;Balance Concerns;History of Falls;Joint Problems;Deconditioning    Cardiac Risk Stratification High   <5 METs on         6 Minute Walk:  6 Minute Walk     Row Name 02/16/24 1315 05/07/24 1645       6 Minute Walk   Phase Initial Discharge  used 4 wheel walker    Distance 840 feet 1060 feet    Distance % Change -- 26.19 %    Distance Feet Change -- 220 ft    Walk Time 6 minutes 6 minutes    # of Rest Breaks 0 0    MPH 16 2.01    METS 1.23 1.6    RPE 11 11    Perceived Dyspnea  0 0    VO2 Peak 4.3 5.56    Symptoms No No    Resting HR 68 bpm 64 bpm  Resting  BP 142/82 134/72    Resting Oxygen Saturation  96 % --    Exercise Oxygen Saturation  during 6 min walk 97 % --    Max Ex. HR 100 bpm 112 bpm    Max Ex. BP 160/80 144/80    2 Minute Post BP 142/84 --       Oxygen Initial Assessment:   Oxygen Re-Evaluation:   Oxygen Discharge (Final Oxygen Re-Evaluation):   Initial Exercise Prescription:  Initial Exercise Prescription - 02/16/24 1500       Date of Initial Exercise RX and Referring Provider   Date 02/16/24    Referring Provider Ozell Fell, MD    Expected Discharge Date 05/09/24      NuStep   Level 1    SPM 60    Minutes 25    METs 1.9      Prescription Details   Frequency (times per week) 3    Duration Progress to 30 minutes of continuous aerobic without signs/symptoms of physical distress      Intensity   THRR 40-80% of Max Heartrate 54-107    Ratings of Perceived Exertion 11-13    Perceived Dyspnea 0-4      Progression   Progression Continue progressive overload as per policy without signs/symptoms or physical distress.      Resistance Training   Training Prescription Yes    Weight 2    Reps 10-15          Perform Capillary Blood Glucose checks as needed.  Exercise Prescription Changes:   Exercise Prescription Changes     Row Name 02/20/24 1644 03/07/24 1630 03/19/24 1648 03/28/24 1622 04/09/24 1643     Response to Exercise   Blood Pressure (Admit) 142/80 140/70 124/70 130/70 122/80   Blood Pressure (Exercise) 142/90 130/70 -- -- --   Blood Pressure (Exit) 124/76 124/78 124/78 120/60 108/62   Heart Rate (Admit) 69 bpm 51 bpm 72 bpm 51 bpm 75 bpm   Heart Rate (Exercise) 95 bpm 73 bpm 86 bpm 89 bpm 121 bpm   Heart Rate (Exit) 73 bpm 60 bpm 71 bpm 50 bpm 72 bpm   Rating of Perceived Exertion (Exercise) 9 9 10 11 12    Perceived Dyspnea (Exercise) 0 0 0 0 0   Symptoms 0 0 0 0 0   Comments Pt first day in the [facility] ICR program Reviewed MET's and goals Reviewed MET's Reviewed MET's and goals  Reviewed MET's and goals   Duration Progress to 30 minutes of  aerobic without signs/symptoms of physical distress Progress to 30 minutes of  aerobic without signs/symptoms of physical distress Progress to 30 minutes of  aerobic without signs/symptoms of physical distress Progress to 30 minutes of  aerobic without signs/symptoms of physical distress Progress to 30 minutes of  aerobic without signs/symptoms of physical distress   Intensity THRR unchanged THRR unchanged THRR unchanged THRR unchanged THRR unchanged     Progression   Progression Continue to progress workloads to maintain intensity without signs/symptoms of physical distress. Continue to progress workloads to maintain intensity without signs/symptoms of physical distress. Continue to progress workloads to maintain intensity without signs/symptoms of physical distress. Continue to progress workloads to maintain intensity without signs/symptoms of physical distress. Continue to progress workloads to maintain intensity without signs/symptoms of physical distress.   Average METs 1.4 1.7 1.8 1.6 1.7     Resistance Training   Training Prescription Yes No Yes Yes Yes   Weight 2 2 2  2  3   Reps 10-15 10-15 10-15 10-15 10-15   Time 10 Minutes 10 Minutes 10 Minutes 10 Minutes 10 Minutes     NuStep   Level 1 1 3 3 3    SPM 64 72 74 70 74   Minutes 25 30 30 30 30    METs 1.4 1.7 1.8 1.6 1.7     Home Exercise Plan   Plans to continue exercise at -- -- Home (comment) Home (comment) Home (comment)   Frequency -- -- Add 2 additional days to program exercise sessions. Add 2 additional days to program exercise sessions. Add 2 additional days to program exercise sessions.   Initial Home Exercises Provided -- -- 03/19/24 03/19/24 03/19/24    Row Name 04/25/24 1654             Response to Exercise   Blood Pressure (Admit) 120/66       Blood Pressure (Exit) 108/70       Heart Rate (Admit) 71 bpm       Heart Rate (Exercise) 89 bpm       Heart  Rate (Exit) 62 bpm       Rating of Perceived Exertion (Exercise) 11       Perceived Dyspnea (Exercise) 0       Symptoms 0       Comments REVD MET's       Duration Progress to 30 minutes of  aerobic without signs/symptoms of physical distress       Intensity THRR unchanged         Progression   Progression Continue to progress workloads to maintain intensity without signs/symptoms of physical distress.       Average METs 2.1         Resistance Training   Training Prescription Yes       Weight 3       Reps 10-15       Time 10 Minutes         NuStep   Level 4       SPM 75       Minutes 30       METs 2.1         Home Exercise Plan   Plans to continue exercise at Home (comment)       Frequency Add 2 additional days to program exercise sessions.       Initial Home Exercises Provided 03/19/24          Exercise Comments:   Exercise Comments     Row Name 02/20/24 1648 03/08/24 0936 03/19/24 1418 03/28/24 1624 04/09/24 1649   Exercise Comments Pt first day in the [facility] ICR program. Pt tolerated exercise well with an average MET level of 1.4. He is off to a good start and is learning his THRR, RPE and ExRx REVD MET's and goals REVD MET's and home ExRx.  Pt tolerated exercise well with an average MET level of 1.8. Patient is doing well, no significant changes with MET's, but he's doing well with exercise, working on trying to set SPM goals and WL increases as able. He will add in exercise on his own by walking around his house (with a cane and cell phone) and chair fitness classes on youtube for 30-45 min (broken up into 10 min bouts as needed) for 1-2 days. Will provide exercise classes at graduation that may be helpful for him REVD MET's.  Pt tolerated exercise well with an average MET level of 1.6. Repetitvely encouraged  INC SPM over the last week of sessions, but patient unable to remeber between sessions what we reviewed prior. Will continue to encourage progress as able. Reviewed  MET's and goals. Pt tolerated exercise well with an average MET level of 1.7. Continueing to encourage INC SPM to increase stamina overall. He is able to do come general house work, but does have a cleaner cone to help with some of the bigger tasks. His SOB has become much more manageable.    Row Name 04/25/24 1657 05/07/24 0944 05/07/24 1654       Exercise Comments Reviewed MET's. Pt tolerated exercise well with an average MET level of 2.1. He was able to achieve a little increase in MET's today. Patient absent since 04/30/24. Will review education when he returns Walk test today and brief review MET's and goals. Pt tolerated exercise well with an average MET level of 1.6. When he graduates the end of the week, working on a plan for continued exercise. Gave him info today on balance and vestibular training through [employer]. He will read over and let me know        Exercise Goals and Review:   Exercise Goals     Row Name 02/16/24 1600             Exercise Goals   Increase Physical Activity Yes       Intervention Develop an individualized exercise prescription for aerobic and resistive training based on initial evaluation findings, risk stratification, comorbidities and participant's personal goals.;Provide advice, education, support and counseling about physical activity/exercise needs.       Expected Outcomes Long Term: Exercising regularly at least 3-5 days a week.;Short Term: Attend rehab on a regular basis to increase amount of physical activity.;Long Term: Add in home exercise to make exercise part of routine and to increase amount of physical activity.       Increase Strength and Stamina Yes       Intervention Provide advice, education, support and counseling about physical activity/exercise needs.;Develop an individualized exercise prescription for aerobic and resistive training based on initial evaluation findings, risk stratification, comorbidities and participant's personal  goals.       Expected Outcomes Short Term: Increase workloads from initial exercise prescription for resistance, speed, and METs.;Short Term: Perform resistance training exercises routinely during rehab and add in resistance training at home;Long Term: Improve cardiorespiratory fitness, muscular endurance and strength as measured by increased METs and functional capacity ( )       Able to understand and use rate of perceived exertion (RPE) scale Yes       Intervention Provide education and explanation on how to use RPE scale       Expected Outcomes Short Term: Able to use RPE daily in rehab to express subjective intensity level;Long Term:  Able to use RPE to guide intensity level when exercising independently       Knowledge and understanding of Target Heart Rate Range (THRR) Yes       Intervention Provide education and explanation of THRR including how the numbers were predicted and where they are located for reference       Expected Outcomes Short Term: Able to state/look up THRR;Long Term: Able to use THRR to govern intensity when exercising independently;Short Term: Able to use daily as guideline for intensity in rehab       Understanding of Exercise Prescription Yes       Intervention Provide education, explanation, and written materials on patient's individual exercise prescription  Expected Outcomes Short Term: Able to explain program exercise prescription;Long Term: Able to explain home exercise prescription to exercise independently          Exercise Goals Re-Evaluation :  Exercise Goals Re-Evaluation     Row Name 02/20/24 1647 03/08/24 0934 03/19/24 1649 04/09/24 1646 05/07/24 1652     Exercise Goal Re-Evaluation   Exercise Goals Review Increase Physical Activity;Understanding of Exercise Prescription;Increase Strength and Stamina;Knowledge and understanding of Target Heart Rate Range (THRR);Able to understand and use rate of perceived exertion (RPE) scale Increase Physical  Activity;Understanding of Exercise Prescription;Increase Strength and Stamina;Knowledge and understanding of Target Heart Rate Range (THRR);Able to understand and use rate of perceived exertion (RPE) scale Increase Physical Activity;Understanding of Exercise Prescription;Increase Strength and Stamina;Knowledge and understanding of Target Heart Rate Range (THRR);Able to understand and use rate of perceived exertion (RPE) scale Increase Physical Activity;Understanding of Exercise Prescription;Increase Strength and Stamina;Knowledge and understanding of Target Heart Rate Range (THRR);Able to understand and use rate of perceived exertion (RPE) scale Increase Physical Activity;Understanding of Exercise Prescription;Increase Strength and Stamina;Knowledge and understanding of Target Heart Rate Range (THRR);Able to understand and use rate of perceived exertion (RPE) scale   Comments Pt first day in the [facility] ICR program. Pt tolerated exercise well with an average MET level of 1.4.  He is off to a good start and is learning his THRR, RPE and ExRx Reviewed MET's and goals. Pt tolerated exercise well with an average MET level of 1.7.  Patient is doing well and making gradual progression, talked about a WL increase next week. So far, he does not feel a change in his goals, but encouraged we are early on and he has increased his time from 25 to 30 mins and increased MET's. Will work on gradual progression overtime Reviewed MET's and goals. Pt tolerated exercise well with an average MET level of 1.7.  Patient is doing well and making gradual progression, talked about a WL increase next week. So far, he does not feel a change in his goals, but encouraged we are early on and he has increased his time from 25 to 30 mins and increased MET's. Will work on gradual progression overtime Reviewed MET's and goals. Pt tolerated exercise well with an average MET level of 1.7.  Continueing to encourage INC SPM to increase stamina  overall. He is able to do come general house work, but does have a cleaner cone to help with some of the bigger tasks. His SOB has become much more manageable. Walk test today and brief  review MET's and goals. Pt tolerated exercise well with an average MET level of 1.6. When he graduates the end of the week, working on a plan for continued exercise. Gave him info today on balance and vestibular training through [place]. He will read over and let me know   Expected Outcomes Will continue to monitor pt and progress workloads as tolerated without sign or symptom Will continue to monitor pt and progress workloads as tolerated without sign or symptom Will continue to monitor pt and progress workloads as tolerated without sign or symptom Will continue to monitor pt and progress workloads as tolerated without sign or symptom Will continue to monitor pt and progress workloads as tolerated without sign or symptom      Discharge Exercise Prescription (Final Exercise Prescription Changes):  Exercise Prescription Changes - 04/25/24 1654       Response to Exercise   Blood Pressure (Admit) 120/66    Blood Pressure (  Exit) 108/70    Heart Rate (Admit) 71 bpm    Heart Rate (Exercise) 89 bpm    Heart Rate (Exit) 62 bpm    Rating of Perceived Exertion (Exercise) 11    Perceived Dyspnea (Exercise) 0    Symptoms 0    Comments REVD MET's    Duration Progress to 30 minutes of  aerobic without signs/symptoms of physical distress    Intensity THRR unchanged      Progression   Progression Continue to progress workloads to maintain intensity without signs/symptoms of physical distress.    Average METs 2.1      Resistance Training   Training Prescription Yes    Weight 3    Reps 10-15    Time 10 Minutes      NuStep   Level 4    SPM 75    Minutes 30    METs 2.1      Home Exercise Plan   Plans to continue exercise at Home (comment)    Frequency Add 2 additional days to program exercise sessions.     Initial Home Exercises Provided 03/19/24          Nutrition:  Target Goals: Understanding of nutrition guidelines, daily intake of sodium 1500mg , cholesterol 200mg , calories 30% from fat and 7% or less from saturated fats, daily to have 5 or more servings of fruits and vegetables.  Biometrics:  Pre Biometrics - 02/16/24 1400       Pre Biometrics   Waist Circumference 45 inches    Hip Circumference 47 inches    Waist to Hip Ratio 0.96 %    Triceps Skinfold 26 mm    % Body Fat 33.9 %    Grip Strength 22 kg    Flexibility 0 in    Single Leg Stand 1 seconds          Post Biometrics - 05/07/24 1651        Post  Biometrics   Height 6' (1.829 m)    Weight 105.5 kg    Waist Circumference 46 inches    Hip Circumference 46.5 inches    Waist to Hip Ratio 0.99 %    BMI (Calculated) 31.54    Triceps Skinfold 16 mm    % Body Fat 32.3 %    Grip Strength 18 kg    Flexibility 0 in    Single Leg Stand --   did not attempt. Assist Device         Nutrition Therapy Plan and Nutrition Goals:  Nutrition Therapy & Goals - 02/20/24 1542       Nutrition Therapy   Diet Heart Healthy Diet      Personal Nutrition Goals   Nutrition Goal Patient to identify strategies for reducing cardiovascular risk by attending the [facility] education and nutrition series weekly.    Personal Goal #2 Patient to improve diet quality by using the plate method as a guide for meal planning to include lean protein/plant protein, fruits, vegetables, whole grains, nonfat dairy as part of a well-balanced diet.    Comments Patient has medical history of RBBB, hyperlipidemia, PAH, HTN, Afib, CHF, Severe MR s/p MTEER, CKD3. A1c is in a diabetic range. Lipids from 2024 and have not been re-checked since starting zetia  08/2023; previously they were not at goal. Patient will benefit from participation in intensive cardiac rehab for nutrition education, exercise, and lifestyle modification.      Intervention Plan    Intervention Prescribe, educate and counsel regarding  individualized specific dietary modifications aiming towards targeted core components such as weight, hypertension, lipid management, diabetes, heart failure and other comorbidities.;Nutrition handout(s) given to patient.    Expected Outcomes Short Term Goal: Understand basic principles of dietary content, such as calories, fat, sodium, cholesterol and nutrients.;Long Term Goal: Adherence to prescribed nutrition plan.          Nutrition Assessments:  Nutrition Assessments - 02/27/24 1705       Rate Your Plate Scores   Pre Score 69         MEDIFICTS Score Key: >=70 Need to make dietary changes  40-70 Heart Healthy Diet <= 40 Therapeutic Level Cholesterol Diet   Flowsheet Row INTENSIVE CARDIAC REHAB from 02/27/2024 in [facility], [facility], [facility] Lung [facility]  Picture Your Plate Total Score on Admission 69   Picture Your Plate Scores: <59 Unhealthy dietary pattern with much room for improvement. 41-50 Dietary pattern unlikely to meet recommendations for good health and room for improvement. 51-60 More healthful dietary pattern, with some room for improvement.  >60 Healthy dietary pattern, although there may be some specific behaviors that could be improved.    Nutrition Goals Re-Evaluation:  Nutrition Goals Re-Evaluation     Row Name 02/20/24 1542             Goals   Current Weight 235 lb 3.7 oz (106.7 kg)       Comment A1c 7.4, cholesterol 213, triglycerides 215, LDL 133       Expected Outcome Patient has medical history of RBBB, hyperlipidemia, PAH, HTN, Afib, CHF, Severe MR s/p MTEER, CKD3. A1c is in a diabetic range. Lipids from 2024 and have not been re-checked since starting zetia  08/2023; previously they were not at goal. Patient will benefit from participation in intensive cardiac rehab for nutrition education, exercise, and lifestyle modification.          Nutrition Goals  Re-Evaluation:  Nutrition Goals Re-Evaluation     Row Name 02/20/24 1542             Goals   Current Weight 235 lb 3.7 oz (106.7 kg)       Comment A1c 7.4, cholesterol 213, triglycerides 215, LDL 133       Expected Outcome Patient has medical history of RBBB, hyperlipidemia, PAH, HTN, Afib, CHF, Severe MR s/p MTEER, CKD3. A1c is in a diabetic range. Lipids from 2024 and have not been re-checked since starting zetia  08/2023; previously they were not at goal. Patient will benefit from participation in intensive cardiac rehab for nutrition education, exercise, and lifestyle modification.          Nutrition Goals Discharge (Final Nutrition Goals Re-Evaluation):  Nutrition Goals Re-Evaluation - 02/20/24 1542       Goals   Current Weight 235 lb 3.7 oz (106.7 kg)    Comment A1c 7.4, cholesterol 213, triglycerides 215, LDL 133    Expected Outcome Patient has medical history of RBBB, hyperlipidemia, PAH, HTN, Afib, CHF, Severe MR s/p MTEER, CKD3. A1c is in a diabetic range. Lipids from 2024 and have not been re-checked since starting zetia  08/2023; previously they were not at goal. Patient will benefit from participation in intensive cardiac rehab for nutrition education, exercise, and lifestyle modification.          Psychosocial: Target Goals: Acknowledge presence or absence of significant depression and/or stress, maximize coping skills, provide positive support system. Participant is able to verbalize types and ability to use techniques and skills  needed for reducing stress and depression.  Initial Review & Psychosocial Screening:  Initial Psych Review & Screening - 02/16/24 1504       Initial Review   Current issues with Current Sleep Concerns;Current Stress Concerns;Current Depression    Source of Stress Concerns Unable to perform yard/household activities;Unable to participate in former interests or hobbies    Comments [name] says he sometimes feels depressed. [name] is not on an  antidepressant and is not interested in counseling at this time.  [name] says he does not sleep well at night due to frequent urination. Discussed sleep hygiene. Recommended trying chamomile tea. [name] says he has tried melatonin in the past without sucess. I encouraged [name] to discuss with his PCP.      Family Dynamics   Good Support System? Yes   Habeeb lives alone. Nadeem's ex wife lives in the area. Darvin has 2 daughters one lives in [place] and was affected by [place] the other daughter lives in [place].     Barriers   Psychosocial barriers to participate in program The patient should benefit from training in stress management and relaxation.      Screening Interventions   Interventions Encouraged to exercise;Provide feedback about the scores to participant    Expected Outcomes Long Term Goal: Stressors or current issues are controlled or eliminated.;Short Term goal: Identification and review with participant of any Quality of Life or Depression concerns found by scoring the questionnaire.;Long Term goal: The participant improves quality of Life and PHQ9 Scores as seen by post scores and/or verbalization of changes          Quality of Life Scores:  Quality of Life - 02/16/24 1558       Quality of Life   Select Quality of Life      Quality of Life Scores   Health/Function Pre 22.29 %    Socioeconomic Pre 24.43 %    Psych/Spiritual Pre 22.5 %    Family Pre 27 %    GLOBAL Pre 22.95 %         Scores of 19 and below usually indicate a poorer quality of life in these areas.  A difference of  2-3 points is a clinically meaningful difference.  A difference of 2-3 points in the total score of the Quality of Life Index has been associated with significant improvement in overall quality of life, self-image, physical symptoms, and general health in studies assessing change in quality of life.  PHQ-9: Review Flowsheet       02/16/2024  Depression screen PHQ 2/9  Decreased Interest 2   Down, Depressed, Hopeless 2  PHQ - 2 Score 4  Altered sleeping 3  Tired, decreased energy 3  Change in appetite 1  Feeling bad or failure about yourself  0  Trouble concentrating 0  Moving slowly or fidgety/restless 0  Suicidal thoughts 0  PHQ-9 Score 11  Difficult doing work/chores Not difficult at all   Interpretation of Total Score  Total Score Depression Severity:  1-4 = Minimal depression, 5-9 = Mild depression, 10-14 = Moderate depression, 15-19 = Moderately severe depression, 20-27 = Severe depression   Psychosocial Evaluation and Intervention:   Psychosocial Re-Evaluation:  Psychosocial Re-Evaluation     Row Name 03/13/24 1538 04/11/24 0914 05/08/24 1203         Psychosocial Re-Evaluation   Current issues with Current Stress Concerns;History of Depression;Current Sleep Concerns Current Stress Concerns;History of Depression;Current Sleep Concerns Current Stress Concerns;History of Depression;Current Sleep Concerns  Comments Reviewed PHQ9. [name] admits to bieng depressed. [name] is not taking any antidepressants at this time. [name] says he has difficulty sleeping and has talked with his primary care provider Dr Onita regarding this. [name] says he does not feel like cooking meals at times. Graciano has not voiced any increased concerns or stressors during exercise at cardiac rehab [name] has not voiced any increased concerns or stressors during exercise at cardiac rehab     Expected Outcomes [name] will have controlled or decreased depression/ stressors  upon completion of cardiac rehab [name] will have controlled or decreased depression/ stressors  upon completion of cardiac rehab [name] will have controlled or decreased depression/ stressors  upon completion of cardiac rehab     Interventions Stress management education;Relaxation education;Encouraged to attend Cardiac Rehabilitation for the exercise Stress management education;Relaxation education;Encouraged to attend Cardiac  Rehabilitation for the exercise Stress management education;Relaxation education;Encouraged to attend Cardiac Rehabilitation for the exercise     Continue Psychosocial Services  Follow up required by staff No Follow up required No Follow up required       Initial Review   Source of Stress Concerns Occupation;Family;Financial Occupation;Family;Financial Occupation;Family;Financial     Comments will continue to monitor and offer support as needed. will continue to monitor and offer support as needed. will continue to monitor and offer support as needed.        Psychosocial Discharge (Final Psychosocial Re-Evaluation):  Psychosocial Re-Evaluation - 05/08/24 1203       Psychosocial Re-Evaluation   Current issues with Current Stress Concerns;History of Depression;Current Sleep Concerns    Comments Jarin has not voiced any increased concerns or stressors during exercise at cardiac rehab    Expected Outcomes Shivansh will have controlled or decreased depression/ stressors  upon completion of cardiac rehab    Interventions Stress management education;Relaxation education;Encouraged to attend Cardiac Rehabilitation for the exercise    Continue Psychosocial Services  No Follow up required      Initial Review   Source of Stress Concerns Occupation;Family;Financial    Comments will continue to monitor and offer support as needed.          Vocational Rehabilitation: Provide vocational rehab assistance to qualifying candidates.   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 [name] does not need vocational rehab at this time.         Education: Education Goals: Education classes will be provided on a weekly basis, covering required topics. Participant will state understanding/return demonstration of topics presented.     Education     Row Name 02/20/24 1400     Education   Cardiac Education Topics Pritikin   Select Workshops     Workshops   Educator Exercise Physiologist   Select Psychosocial   Psychosocial Workshop Recognizing and Reducing Stress   Instruction Review Code 1- Verbalizes Understanding   Class Start Time 1405   Class Stop Time 1448   Class Time Calculation (min) 43 min    Row Name 02/22/24 1400     Education   Cardiac Education Topics Pritikin   Customer Service Manager   Weekly Topic [employer] Desserts   Instruction Review Code 1- Verbalizes Understanding   Class Start Time 1400   Class Stop Time 1450   Class  Time Calculation (min) 50 min    Row Name 02/24/24 1500     Education   Cardiac Education Topics Pritikin   Select Core Videos     Core Videos   Educator Dietitian   Select Nutrition   Nutrition Calorie Density   Instruction Review Code 1- Verbalizes Understanding   Class Start Time 1400   Class Stop Time 1435   Class Time Calculation (min) 35 min    Row Name 02/27/24 1400     Education   Cardiac Education Topics Pritikin   Geographical Information Systems Officer Exercise   Exercise Workshop Exercise Basics: Diplomatic Services Operational Officer   Instruction Review Code 1- Verbalizes Understanding   Class Start Time 1420   Class Stop Time 1500   Class Time Calculation (min) 40 min    Row Name 02/29/24 1400     Education   Cardiac Education Topics Pritikin   Customer Service Manager   Weekly Topic Efficiency Cooking - Meals in a Snap   Instruction Review Code 1- Verbalizes Understanding   Class Start Time 1400   Class Stop Time 1440   Class Time Calculation (min) 40 min    Row Name 03/02/24 1400     Education   Cardiac Education Topics Pritikin   Psychologist, Forensic Exercise  Education   Exercise Education Move It!   Instruction Review Code 1- Verbalizes Understanding   Class Start Time 1359   Class Stop Time 1432   Class Time Calculation (min) 33 min    Row Name 03/07/24 1400     Education   Cardiac Education Topics Pritikin   Customer Service Manager   Weekly Topic One-Pot Wonders   Instruction Review Code 1- Verbalizes Understanding   Class Start Time 1400   Class Stop Time 1440   Class Time Calculation (min) 40 min    Row Name 03/12/24 1400     Education   Cardiac Education Topics Pritikin   Geographical Information Systems Officer Psychosocial   Psychosocial Workshop Focused Goals, Sustainable Changes   Instruction Review Code 1- Verbalizes Understanding   Class Start Time 1359   Class Stop Time 1434   Class Time Calculation (min) 35 min    Row Name 03/14/24 1300     Education   Cardiac Education Topics Pritikin   Customer Service Manager   Weekly Topic Comforting Weekend Breakfasts   Instruction Review Code 1- Verbalizes Understanding   Class Start Time 1358   Class Stop Time 1439   Class Time Calculation (min) 41 min    Row Name 03/16/24 1400     Education   Cardiac Education Topics Pritikin   Nurse, Children's Exercise Physiologist   Select Nutrition   Nutrition Dining Out - Part 1   Instruction Review Code 1- Verbalizes Understanding   Class Start Time 1400   Class Stop Time 1440   Class Time Calculation (min) 40 min    Row Name 03/19/24 1400     Education   Cardiac Education Topics Pritikin   Psychologist, Sport And Exercise  Core Videos   Educator Exercise Physiologist   Select Exercise Education   Exercise Education Biomechanial Limitations   Instruction Review Code 1- Verbalizes Understanding   Class Start Time 1405   Class Stop Time 1440   Class Time Calculation (min) 35 min     Row Name 03/21/24 1400     Education   Cardiac Education Topics Pritikin   Secondary School Teacher School   Educator Nurse;Respiratory Therapist   Weekly Topic Fast Evening Meals   Instruction Review Code 1- Verbalizes Understanding   Class Start Time 1356   Class Stop Time 1428   Class Time Calculation (min) 32 min    Row Name 03/23/24 1400     Education   Cardiac Education Topics Pritikin   Licensed Conveyancer Nutrition   Nutrition Vitamins and Minerals   Instruction Review Code 1- Verbalizes Understanding   Class Start Time 1357   Class Stop Time 1440   Class Time Calculation (min) 43 min    Row Name 03/26/24 1400     Education   Cardiac Education Topics Pritikin   Psychologist, Forensic Exercise Education   Exercise Education Improving Performance   Instruction Review Code 1- Verbalizes Understanding   Class Start Time 1400   Class Stop Time 1438   Class Time Calculation (min) 38 min    Row Name 03/28/24 1500     Education   Cardiac Education Topics Pritikin   Customer Service Manager   Weekly Topic International Cuisine- Spotlight on the [employer] Zones   Instruction Review Code 1- Verbalizes Understanding   Class Start Time 1358   Class Stop Time 1435   Class Time Calculation (min) 37 min    Row Name 03/30/24 1400     Education   Cardiac Education Topics Pritikin   Glass Blower/designer Nutrition   Nutrition Workshop Fueling a Forensic Psychologist   Instruction Review Code 1- Tax Inspector   Class Start Time 1357   Class Stop Time 1433   Class Time Calculation (min) 36 min    Row Name 04/04/24 1400     Education   Cardiac Education Topics Pritikin   Set Designer Dietitian;Respiratory Therapist   Weekly Topic  Simple Sides and Sauces   Instruction Review Code 1- Verbalizes Understanding   Class Start Time 1400   Class Stop Time 1435   Class Time Calculation (min) 35 min    Row Name 04/06/24 1400     Education   Cardiac Education Topics Pritikin   Nurse, Children's Exercise Physiologist   Select Psychosocial   Psychosocial How Our Thoughts Can Heal Our Hearts   Instruction Review Code 1- Verbalizes Understanding   Class Start Time 1400   Class Stop Time 1436   Class Time Calculation (min) 36 min    Row Name 04/09/24 1400     Education   Cardiac Education Topics Pritikin   Hospital Doctor Education   General Education Heart Disease Risk Reduction   Instruction  Review Code 1- Verbalizes Understanding   Class Start Time 1410   Class Stop Time 1450   Class Time Calculation (min) 40 min    Row Name 04/13/24 1400     Education   Cardiac Education Topics Pritikin   Select Core Videos     Core Videos   Educator Dietitian   Select Nutrition   Nutrition Facts on Fat   Instruction Review Code 1- Verbalizes Understanding   Class Start Time 1357   Class Stop Time 1436   Class Time Calculation (min) 39 min    Row Name 04/18/24 1400     Education   Cardiac Education Topics Pritikin   Customer Service Manager   Weekly Topic Tasty Appetizers and Snacks   Instruction Review Code 1- Verbalizes Understanding   Class Start Time 1400   Class Stop Time 1442   Class Time Calculation (min) 42 min    Row Name 04/20/24 1400     Education   Cardiac Education Topics Pritikin   Select Workshops     Workshops   Educator Nurse   Select Exercise   Exercise Workshop Managing Heart Disease: Your Path to a Healthier Heart   Instruction Review Code 1- Verbalizes Understanding   Class Start Time 1405   Class Stop Time 1453   Class Time Calculation  (min) 48 min    Row Name 04/25/24 1500     Education   Cardiac Education Topics Pritikin   Secondary School Teacher School   Educator Nurse;Respiratory Therapist   Weekly Topic Adding Flavor - Sodium-Free   Instruction Review Code 1- Verbalizes Understanding   Class Start Time 1357   Class Stop Time 1432   Class Time Calculation (min) 35 min    Row Name 04/30/24 1400     Education   Cardiac Education Topics Pritikin   Select Workshops     Workshops   Educator Exercise Physiologist   Select Exercise   Exercise Workshop Location Manager and Fall Prevention   Instruction Review Code 1- Verbalizes Understanding   Class Start Time 1408   Class Stop Time 1500   Class Time Calculation (min) 52 min    Row Name 05/07/24 1400     Education   Cardiac Education Topics Pritikin   Hospital Doctor Education   General Education Metabolic Syndrome and Belly Fat   Instruction Review Code 1- Verbalizes Understanding   Class Start Time 1407   Class Stop Time 1445   Class Time Calculation (min) 38 min      Core Videos: Exercise    Move It!  Clinical staff conducted group or individual video education with verbal and written material and guidebook.  Patient learns the recommended Pritikin exercise program. Exercise with the goal of living a long, healthy life. Some of the health benefits of exercise include controlled diabetes, healthier blood pressure levels, improved cholesterol levels, improved heart and lung capacity, improved sleep, and better body composition. Everyone should speak with their doctor before starting or changing an exercise routine.  Biomechanical Limitations Clinical staff conducted group or individual video education with verbal and written material and guidebook.  Patient learns how biomechanical limitations can impact exercise and how we can mitigate and possibly overcome  limitations to have an impactful and balanced exercise routine.  Body Composition Clinical staff conducted  group or individual video education with verbal and written material and guidebook.  Patient learns that body composition (ratio of muscle mass to fat mass) is a key component to assessing overall fitness, rather than body weight alone. Increased fat mass, especially visceral belly fat, can put us  at increased risk for metabolic syndrome, type 2 diabetes, heart disease, and even death. It is recommended to combine diet and exercise (cardiovascular and resistance training) to improve your body composition. Seek guidance from your physician and exercise physiologist before implementing an exercise routine.  Exercise Action Plan Clinical staff conducted group or individual video education with verbal and written material and guidebook.  Patient learns the recommended strategies to achieve and enjoy long-term exercise adherence, including variety, self-motivation, self-efficacy, and positive decision making. Benefits of exercise include fitness, good health, weight management, more energy, better sleep, less stress, and overall well-being.  Medical   Heart Disease Risk Reduction Clinical staff conducted group or individual video education with verbal and written material and guidebook.  Patient learns our heart is our most vital organ as it circulates oxygen, nutrients, white blood cells, and hormones throughout the entire body, and carries waste away. Data supports a plant-based eating plan like the Pritikin Program for its effectiveness in slowing progression of and reversing heart disease. The video provides a number of recommendations to address heart disease.   Metabolic Syndrome and Belly Fat  Clinical staff conducted group or individual video education with verbal and written material and guidebook.  Patient learns what metabolic syndrome is, how it leads to heart disease, and how one can  reverse it and keep it from coming back. You have metabolic syndrome if you have 3 of the following 5 criteria: abdominal obesity, high blood pressure, high triglycerides, low HDL cholesterol, and high blood sugar.  Hypertension and Heart Disease Clinical staff conducted group or individual video education with verbal and written material and guidebook.  Patient learns that high blood pressure, or hypertension, is very common in the [place] . Hypertension is largely due to excessive salt intake, but other important risk factors include being overweight, physical inactivity, drinking too much alcohol , smoking, and not eating enough potassium from fruits and vegetables. High blood pressure is a leading risk factor for heart attack, stroke, congestive heart failure, dementia, kidney failure, and premature death. Long-term effects of excessive salt intake include stiffening of the arteries and thickening of heart muscle and organ damage. Recommendations include ways to reduce hypertension and the risk of heart disease.  Diseases of Our Time - Focusing on Diabetes Clinical staff conducted group or individual video education with verbal and written material and guidebook.  Patient learns why the best way to stop diseases of our time is prevention, through food and other lifestyle changes. Medicine (such as prescription pills and surgeries) is often only a Band-Aid on the problem, not a long-term solution. Most common diseases of our time include obesity, type 2 diabetes, hypertension, heart disease, and cancer. The Pritikin Program is recommended and has been proven to help reduce, reverse, and/or prevent the damaging effects of metabolic syndrome.  Nutrition   Overview of the Pritikin Eating Plan  Clinical staff conducted group or individual video education with verbal and written material and guidebook.  Patient learns about the Pritikin Eating Plan for disease risk reduction. The Pritikin Eating Plan  emphasizes a wide variety of unrefined, minimally-processed carbohydrates, like fruits, vegetables, whole grains, and legumes. Go, Caution, and Stop food choices are explained. Plant-based and lean animal  proteins are emphasized. Rationale provided for low sodium intake for blood pressure control, low added sugars for blood sugar stabilization, and low added fats and oils for coronary artery disease risk reduction and weight management.  Calorie Density  Clinical staff conducted group or individual video education with verbal and written material and guidebook.  Patient learns about calorie density and how it impacts the Pritikin Eating Plan. Knowing the characteristics of the food you choose will help you decide whether those foods will lead to weight gain or weight loss, and whether you want to consume more or less of them. Weight loss is usually a side effect of the Pritikin Eating Plan because of its focus on low calorie-dense foods.  Label Reading  Clinical staff conducted group or individual video education with verbal and written material and guidebook.  Patient learns about the Pritikin recommended label reading guidelines and corresponding recommendations regarding calorie density, added sugars, sodium content, and whole grains.  Dining Out - Part 1  Clinical staff conducted group or individual video education with verbal and written material and guidebook.  Patient learns that restaurant meals can be sabotaging because they can be so high in calories, fat, sodium, and/or sugar. Patient learns recommended strategies on how to positively address this and avoid unhealthy pitfalls.  Facts on Fats  Clinical staff conducted group or individual video education with verbal and written material and guidebook.  Patient learns that lifestyle modifications can be just as effective, if not more so, as many medications for lowering your risk of heart disease. A Pritikin lifestyle can help to reduce your  risk of inflammation and atherosclerosis (cholesterol build-up, or plaque, in the artery walls). Lifestyle interventions such as dietary choices and physical activity address the cause of atherosclerosis. A review of the types of fats and their impact on blood cholesterol levels, along with dietary recommendations to reduce fat intake is also included.  Nutrition Action Plan  Clinical staff conducted group or individual video education with verbal and written material and guidebook.  Patient learns how to incorporate Pritikin recommendations into their lifestyle. Recommendations include planning and keeping personal health goals in mind as an important part of their success.  Healthy Mind-Set    Healthy Minds, Bodies, Hearts  Clinical staff conducted group or individual video education with verbal and written material and guidebook.  Patient learns how to identify when they are stressed. Video will discuss the impact of that stress, as well as the many benefits of stress management. Patient will also be introduced to stress management techniques. The way we think, act, and feel has an impact on our hearts.  How Our Thoughts Can Heal Our Hearts  Clinical staff conducted group or individual video education with verbal and written material and guidebook.  Patient learns that negative thoughts can cause depression and anxiety. This can result in negative lifestyle behavior and serious health problems. Cognitive behavioral therapy is an effective method to help control our thoughts in order to change and improve our emotional outlook.  Additional Videos:  Exercise    Improving Performance  Clinical staff conducted group or individual video education with verbal and written material and guidebook.  Patient learns to use a non-linear approach by alternating intensity levels and lengths of time spent exercising to help burn more calories and lose more body fat. Cardiovascular exercise helps improve heart  health, metabolism, hormonal balance, blood sugar control, and recovery from fatigue. Resistance training improves strength, endurance, balance, coordination, reaction time, metabolism, and muscle  mass. Flexibility exercise improves circulation, posture, and balance. Seek guidance from your physician and exercise physiologist before implementing an exercise routine and learn your capabilities and proper form for all exercise.  Introduction to Yoga  Clinical staff conducted group or individual video education with verbal and written material and guidebook.  Patient learns about yoga, a discipline of the coming together of mind, breath, and body. The benefits of yoga include improved flexibility, improved range of motion, better posture and core strength, increased lung function, weight loss, and positive self-image. Yoga's heart health benefits include lowered blood pressure, healthier heart rate, decreased cholesterol and triglyceride levels, improved immune function, and reduced stress. Seek guidance from your physician and exercise physiologist before implementing an exercise routine and learn your capabilities and proper form for all exercise.  Medical   Aging: Enhancing Your Quality of Life  Clinical staff conducted group or individual video education with verbal and written material and guidebook.  Patient learns key strategies and recommendations to stay in good physical health and enhance quality of life, such as prevention strategies, having an advocate, securing a Health Care Proxy and Power of Attorney, and keeping a list of medications and system for tracking them. It also discusses how to avoid risk for bone loss.  Biology of Weight Control  Clinical staff conducted group or individual video education with verbal and written material and guidebook.  Patient learns that weight gain occurs because we consume more calories than we burn (eating more, moving less). Even if your body weight is  normal, you may have higher ratios of fat compared to muscle mass. Too much body fat puts you at increased risk for cardiovascular disease, heart attack, stroke, type 2 diabetes, and obesity-related cancers. In addition to exercise, following the Pritikin Eating Plan can help reduce your risk.  Decoding Lab Results  Clinical staff conducted group or individual video education with verbal and written material and guidebook.  Patient learns that lab test reflects one measurement whose values change over time and are influenced by many factors, including medication, stress, sleep, exercise, food, hydration, pre-existing medical conditions, and more. It is recommended to use the knowledge from this video to become more involved with your lab results and evaluate your numbers to speak with your doctor.   Diseases of Our Time - Overview  Clinical staff conducted group or individual video education with verbal and written material and guidebook.  Patient learns that according to the CDC, 50% to 70% of chronic diseases (such as obesity, type 2 diabetes, elevated lipids, hypertension, and heart disease) are avoidable through lifestyle improvements including healthier food choices, listening to satiety cues, and increased physical activity.  Sleep Disorders Clinical staff conducted group or individual video education with verbal and written material and guidebook.  Patient learns how good quality and duration of sleep are important to overall health and well-being. Patient also learns about sleep disorders and how they impact health along with recommendations to address them, including discussing with a physician.  Nutrition  Dining Out - Part 2 Clinical staff conducted group or individual video education with verbal and written material and guidebook.  Patient learns how to plan ahead and communicate in order to maximize their dining experience in a healthy and nutritious manner. Included are recommended  food choices based on the type of restaurant the patient is visiting.   Fueling a Banker conducted group or individual video education with verbal and written material and guidebook.  There is  a strong connection between our food choices and our health. Diseases like obesity and type 2 diabetes are very prevalent and are in large-part due to lifestyle choices. The Pritikin Eating Plan provides plenty of food and hunger-curbing satisfaction. It is easy to follow, affordable, and helps reduce health risks.  Menu Workshop  Clinical staff conducted group or individual video education with verbal and written material and guidebook.  Patient learns that restaurant meals can sabotage health goals because they are often packed with calories, fat, sodium, and sugar. Recommendations include strategies to plan ahead and to communicate with the manager, chef, or server to help order a healthier meal.  Planning Your Eating Strategy  Clinical staff conducted group or individual video education with verbal and written material and guidebook.  Patient learns about the Pritikin Eating Plan and its benefit of reducing the risk of disease. The Pritikin Eating Plan does not focus on calories. Instead, it emphasizes high-quality, nutrient-rich foods. By knowing the characteristics of the foods, we choose, we can determine their calorie density and make informed decisions.  Targeting Your Nutrition Priorities  Clinical staff conducted group or individual video education with verbal and written material and guidebook.  Patient learns that lifestyle habits have a tremendous impact on disease risk and progression. This video provides eating and physical activity recommendations based on your personal health goals, such as reducing LDL cholesterol, losing weight, preventing or controlling type 2 diabetes, and reducing high blood pressure.  Vitamins and Minerals  Clinical staff conducted group or  individual video education with verbal and written material and guidebook.  Patient learns different ways to obtain key vitamins and minerals, including through a recommended healthy diet. It is important to discuss all supplements you take with your doctor.   Healthy Mind-Set    Smoking Cessation  Clinical staff conducted group or individual video education with verbal and written material and guidebook.  Patient learns that cigarette smoking and tobacco addiction pose a serious health risk which affects millions of people. Stopping smoking will significantly reduce the risk of heart disease, lung disease, and many forms of cancer. Recommended strategies for quitting are covered, including working with your doctor to develop a successful plan.  Culinary   Becoming a Set Designer conducted group or individual video education with verbal and written material and guidebook.  Patient learns that cooking at home can be healthy, cost-effective, quick, and puts them in control. Keys to cooking healthy recipes will include looking at your recipe, assessing your equipment needs, planning ahead, making it simple, choosing cost-effective seasonal ingredients, and limiting the use of added fats, salts, and sugars.  Cooking - Breakfast and Snacks  Clinical staff conducted group or individual video education with verbal and written material and guidebook.  Patient learns how important breakfast is to satiety and nutrition through the entire day. Recommendations include key foods to eat during breakfast to help stabilize blood sugar levels and to prevent overeating at meals later in the day. Planning ahead is also a key component.  Cooking - Educational Psychologist conducted group or individual video education with verbal and written material and guidebook.  Patient learns eating strategies to improve overall health, including an approach to cook more at home. Recommendations include  thinking of animal protein as a side on your plate rather than center stage and focusing instead on lower calorie dense options like vegetables, fruits, whole grains, and plant-based proteins, such as beans. Making sauces in large  quantities to freeze for later and leaving the skin on your vegetables are also recommended to maximize your experience.  Cooking - Healthy Salads and Dressing Clinical staff conducted group or individual video education with verbal and written material and guidebook.  Patient learns that vegetables, fruits, whole grains, and legumes are the foundations of the Pritikin Eating Plan. Recommendations include how to incorporate each of these in flavorful and healthy salads, and how to create homemade salad dressings. Proper handling of ingredients is also covered. Cooking - Soups and [employer] - Soups and Desserts Clinical staff conducted group or individual video education with verbal and written material and guidebook.  Patient learns that Pritikin soups and desserts make for easy, nutritious, and delicious snacks and meal components that are low in sodium, fat, sugar, and calorie density, while high in vitamins, minerals, and filling fiber. Recommendations include simple and healthy ideas for soups and desserts.   Overview     The Pritikin Solution Program Overview Clinical staff conducted group or individual video education with verbal and written material and guidebook.  Patient learns that the results of the Pritikin Program have been documented in more than 100 articles published in peer-reviewed journals, and the benefits include reducing risk factors for (and, in some cases, even reversing) high cholesterol, high blood pressure, type 2 diabetes, obesity, and more! An overview of the three key pillars of the Pritikin Program will be covered: eating well, doing regular exercise, and having a healthy mind-set.  WORKSHOPS  Exercise: Exercise Basics: Building Your  Action Plan Clinical staff led group instruction and group discussion with PowerPoint presentation and patient guidebook. To enhance the learning environment the use of posters, models and videos may be added. At the conclusion of this workshop, patients will comprehend the difference between physical activity and exercise, as well as the benefits of incorporating both, into their routine. Patients will understand the FITT (Frequency, Intensity, Time, and Type) principle and how to use it to build an exercise action plan. In addition, safety concerns and other considerations for exercise and cardiac rehab will be addressed by the presenter. The purpose of this lesson is to promote a comprehensive and effective weekly exercise routine in order to improve patients' overall level of fitness.   Managing Heart Disease: Your Path to a Healthier Heart Clinical staff led group instruction and group discussion with PowerPoint presentation and patient guidebook. To enhance the learning environment the use of posters, models and videos may be added.At the conclusion of this workshop, patients will understand the anatomy and physiology of the heart. Additionally, they will understand how Pritikin's three pillars impact the risk factors, the progression, and the management of heart disease.  The purpose of this lesson is to provide a high-level overview of the heart, heart disease, and how the Pritikin lifestyle positively impacts risk factors.  Exercise Biomechanics Clinical staff led group instruction and group discussion with PowerPoint presentation and patient guidebook. To enhance the learning environment the use of posters, models and videos may be added. Patients will learn how the structural parts of their bodies function and how these functions impact their daily activities, movement, and exercise. Patients will learn how to promote a neutral spine, learn how to manage pain, and identify ways to  improve their physical movement in order to promote healthy living. The purpose of this lesson is to expose patients to common physical limitations that impact physical activity. Participants will learn practical ways to adapt and manage aches and pains,  and to minimize their effect on regular exercise. Patients will learn how to maintain good posture while sitting, walking, and lifting.  Balance Training and Fall Prevention  Clinical staff led group instruction and group discussion with PowerPoint presentation and patient guidebook. To enhance the learning environment the use of posters, models and videos may be added. At the conclusion of this workshop, patients will understand the importance of their sensorimotor skills (vision, proprioception, and the vestibular system) in maintaining their ability to balance as they age. Patients will apply a variety of balancing exercises that are appropriate for their current level of function. Patients will understand the common causes for poor balance, possible solutions to these problems, and ways to modify their physical environment in order to minimize their fall risk. The purpose of this lesson is to teach patients about the importance of maintaining balance as they age and ways to minimize their risk of falling.  WORKSHOPS   Nutrition:  Fueling a Ship Broker led group instruction and group discussion with PowerPoint presentation and patient guidebook. To enhance the learning environment the use of posters, models and videos may be added. Patients will review the foundational principles of the Pritikin Eating Plan and understand what constitutes a serving size in each of the food groups. Patients will also learn Pritikin-friendly foods that are better choices when away from home and review make-ahead meal and snack options. Calorie density will be reviewed and applied to three nutrition priorities: weight maintenance, weight loss, and  weight gain. The purpose of this lesson is to reinforce (in a group setting) the key concepts around what patients are recommended to eat and how to apply these guidelines when away from home by planning and selecting Pritikin-friendly options. Patients will understand how calorie density may be adjusted for different weight management goals.  Mindful Eating  Clinical staff led group instruction and group discussion with PowerPoint presentation and patient guidebook. To enhance the learning environment the use of posters, models and videos may be added. Patients will briefly review the concepts of the Pritikin Eating Plan and the importance of low-calorie dense foods. The concept of mindful eating will be introduced as well as the importance of paying attention to internal hunger signals. Triggers for non-hunger eating and techniques for dealing with triggers will be explored. The purpose of this lesson is to provide patients with the opportunity to review the basic principles of the Pritikin Eating Plan, discuss the value of eating mindfully and how to measure internal cues of hunger and fullness using the Hunger Scale. Patients will also discuss reasons for non-hunger eating and learn strategies to use for controlling emotional eating.  Targeting Your Nutrition Priorities Clinical staff led group instruction and group discussion with PowerPoint presentation and patient guidebook. To enhance the learning environment the use of posters, models and videos may be added. Patients will learn how to determine their genetic susceptibility to disease by reviewing their family history. Patients will gain insight into the importance of diet as part of an overall healthy lifestyle in mitigating the impact of genetics and other environmental insults. The purpose of this lesson is to provide patients with the opportunity to assess their personal nutrition priorities by looking at their family history, their own health  history and current risk factors. Patients will also be able to discuss ways of prioritizing and modifying the Pritikin Eating Plan for their highest risk areas  Menu  Clinical staff led group instruction and group discussion with PowerPoint presentation  and patient guidebook. To enhance the learning environment the use of posters, models and videos may be added. Using menus brought in from [employer], or printed from [employer], patients will apply the Pritikin dining out guidelines that were presented in the [employer] video. Patients will also be able to practice these guidelines in a variety of provided scenarios. The purpose of this lesson is to provide patients with the opportunity to practice hands-on learning of the Pritikin Dining Out guidelines with actual menus and practice scenarios.  Label Reading Clinical staff led group instruction and group discussion with PowerPoint presentation and patient guidebook. To enhance the learning environment the use of posters, models and videos may be added. Patients will review and discuss the Pritikin label reading guidelines presented in Pritikin's Label Reading Educational series video. Using fool labels brought in from local grocery stores and markets, patients will apply the label reading guidelines and determine if the packaged food meet the Pritikin guidelines. The purpose of this lesson is to provide patients with the opportunity to review, discuss, and practice hands-on learning of the Pritikin Label Reading guidelines with actual packaged food labels. Cooking School  Pritikin's [employer] are designed to teach patients ways to prepare quick, simple, and affordable recipes at home. The importance of nutrition's role in chronic disease risk reduction is reflected in its emphasis in the overall Pritikin program. By learning how to prepare essential core Pritikin Eating Plan recipes, patients will increase  control over what they eat; be able to customize the flavor of foods without the use of added salt, sugar, or fat; and improve the quality of the food they consume. By learning a set of core recipes which are easily assembled, quickly prepared, and affordable, patients are more likely to prepare more healthy foods at home. These workshops focus on convenient breakfasts, simple entres, side dishes, and desserts which can be prepared with minimal effort and are consistent with nutrition recommendations for cardiovascular risk reduction. Cooking [employer] are taught by a armed forces logistics/support/administrative officer (RD) who has been trained by the [employer]. The chef or RD has a clear understanding of the importance of minimizing - if not completely eliminating - added fat, sugar, and sodium in recipes. Throughout the series of Cooking School Workshop sessions, patients will learn about healthy ingredients and efficient methods of cooking to build confidence in their capability to prepare    Cooking School weekly topics:  Adding Flavor- Sodium-Free  Fast and Healthy Breakfasts  Powerhouse Plant-Based Proteins  Satisfying Salads and Dressings  Simple Sides and Sauces  International Cuisine-Spotlight on the [employer] Zones  Delicious Desserts  Savory Soups  [employer] - Meals in a Astronomer Appetizers and Snacks  Comforting Weekend Breakfasts  One-Pot Wonders   Fast Evening Meals  Landscape Architect Your Pritikin Plate  WORKSHOPS   Healthy Mindset (Psychosocial):  Focused Goals, Sustainable Changes Clinical staff led group instruction and group discussion with PowerPoint presentation and patient guidebook. To enhance the learning environment the use of posters, models and videos may be added. Patients will be able to apply effective goal setting strategies to establish at least one personal goal, and then take consistent, meaningful action toward that goal. They will  learn to identify common barriers to achieving personal goals and develop strategies to overcome them. Patients will also gain an understanding of how our mind-set can impact our ability to achieve goals and the importance of cultivating  a positive and growth-oriented mind-set. The purpose of this lesson is to provide patients with a deeper understanding of how to set and achieve personal goals, as well as the tools and strategies needed to overcome common obstacles which may arise along the way.  From Head to Heart: The Power of a Healthy Outlook  Clinical staff led group instruction and group discussion with PowerPoint presentation and patient guidebook. To enhance the learning environment the use of posters, models and videos may be added. Patients will be able to recognize and describe the impact of emotions and mood on physical health. They will discover the importance of self-care and explore self-care practices which may work for them. Patients will also learn how to utilize the 4 C's to cultivate a healthier outlook and better manage stress and challenges. The purpose of this lesson is to demonstrate to patients how a healthy outlook is an essential part of maintaining good health, especially as they continue their cardiac rehab journey.  Healthy Sleep for a Healthy Heart Clinical staff led group instruction and group discussion with PowerPoint presentation and patient guidebook. To enhance the learning environment the use of posters, models and videos may be added. At the conclusion of this workshop, patients will be able to demonstrate knowledge of the importance of sleep to overall health, well-being, and quality of life. They will understand the symptoms of, and treatments for, common sleep disorders. Patients will also be able to identify daytime and nighttime behaviors which impact sleep, and they will be able to apply these tools to help manage sleep-related challenges. The purpose of this  lesson is to provide patients with a general overview of sleep and outline the importance of quality sleep. Patients will learn about a few of the most common sleep disorders. Patients will also be introduced to the concept of "sleep hygiene," and discover ways to self-manage certain sleeping problems through simple daily behavior changes. Finally, the workshop will motivate patients by clarifying the links between quality sleep and their goals of heart-healthy living.   Recognizing and Reducing Stress Clinical staff led group instruction and group discussion with PowerPoint presentation and patient guidebook. To enhance the learning environment the use of posters, models and videos may be added. At the conclusion of this workshop, patients will be able to understand the types of stress reactions, differentiate between acute and chronic stress, and recognize the impact that chronic stress has on their health. They will also be able to apply different coping mechanisms, such as reframing negative self-talk. Patients will have the opportunity to practice a variety of stress management techniques, such as deep abdominal breathing, progressive muscle relaxation, and/or guided imagery.  The purpose of this lesson is to educate patients on the role of stress in their lives and to provide healthy techniques for coping with it.  Learning Barriers/Preferences:  Learning Barriers/Preferences - 02/16/24 1600       Learning Barriers/Preferences   Learning Barriers Hearing;Sight    Learning Preferences Audio;Computer/Internet;Group Instruction;Skilled Demonstration;Verbal Instruction;Video;Written Material;Individual Instruction;Pictoral          Education Topics:  Knowledge Questionnaire Score:  Knowledge Questionnaire Score - 02/16/24 1558       Knowledge Questionnaire Score   Pre Score 20/24          Core Components/Risk Factors/Patient Goals at Admission:  Personal Goals and Risk Factors at  Admission - 02/16/24 1601       Core Components/Risk Factors/Patient Goals on Admission    Weight Management Yes;Obesity  Intervention Weight Management: Provide education and appropriate resources to help participant work on and attain dietary goals.;Weight Management: Develop a combined nutrition and exercise program designed to reach desired caloric intake, while maintaining appropriate intake of nutrient and fiber, sodium and fats, and appropriate energy expenditure required for the weight goal.;Weight Management/Obesity: Establish reasonable short term and long term weight goals.;Obesity: Provide education and appropriate resources to help participant work on and attain dietary goals.    Expected Outcomes Long Term: Adherence to nutrition and physical activity/exercise program aimed toward attainment of established weight goal;Short Term: Continue to assess and modify interventions until short term weight is achieved;Understanding recommendations for meals to include 15-35% energy as protein, 25-35% energy from fat, 35-60% energy from carbohydrates, less than 200mg  of dietary cholesterol, 20-35 gm of total fiber daily;Understanding of distribution of calorie intake throughout the day with the consumption of 4-5 meals/snacks    Diabetes Yes    Intervention Provide education about signs/symptoms and action to take for hypo/hyperglycemia.;Provide education about proper nutrition, including hydration, and aerobic/resistive exercise prescription along with prescribed medications to achieve blood glucose in normal ranges: Fasting glucose 65-99 mg/dL    Expected Outcomes Short Term: Participant verbalizes understanding of the signs/symptoms and immediate care of hyper/hypoglycemia, proper foot care and importance of medication, aerobic/resistive exercise and nutrition plan for blood glucose control.;Long Term: Attainment of HbA1C < 7%.    Heart Failure Yes    Intervention Provide a combined exercise and  nutrition program that is supplemented with education, support and counseling about heart failure. Directed toward relieving symptoms such as shortness of breath, decreased exercise tolerance, and extremity edema.    Expected Outcomes Improve functional capacity of life;Short term: Attendance in program 2-3 days a week with increased exercise capacity. Reported lower sodium intake. Reported increased fruit and vegetable intake. Reports medication compliance.;Short term: Daily weights obtained and reported for increase. Utilizing diuretic protocols set by physician.;Long term: Adoption of self-care skills and reduction of barriers for early signs and symptoms recognition and intervention leading to self-care maintenance.    Hypertension Yes    Intervention Provide education on lifestyle modifcations including regular physical activity/exercise, weight management, moderate sodium restriction and increased consumption of fresh fruit, vegetables, and low fat dairy, alcohol  moderation, and smoking cessation.;Monitor prescription use compliance.    Expected Outcomes Short Term: Continued assessment and intervention until BP is < 140/60mm HG in hypertensive participants. < 130/47mm HG in hypertensive participants with diabetes, heart failure or chronic kidney disease.;Long Term: Maintenance of blood pressure at goal levels.    Lipids Yes    Intervention Provide education and support for participant on nutrition & aerobic/resistive exercise along with prescribed medications to achieve LDL 70mg , HDL >40mg .    Expected Outcomes Short Term: Participant states understanding of desired cholesterol values and is compliant with medications prescribed. Participant is following exercise prescription and nutrition guidelines.;Long Term: Cholesterol controlled with medications as prescribed, with individualized exercise RX and with personalized nutrition plan. Value goals: LDL < 70mg , HDL > 40 mg.    Stress Yes     Intervention Offer individual and/or small group education and counseling on adjustment to heart disease, stress management and health-related lifestyle change. Teach and support self-help strategies.;Refer participants experiencing significant psychosocial distress to appropriate mental health specialists for further evaluation and treatment. When possible, include family members and significant others in education/counseling sessions.    Expected Outcomes Long Term: Emotional wellbeing is indicated by absence of clinically significant psychosocial distress or social isolation.;Short Term: Participant  demonstrates changes in health-related behavior, relaxation and other stress management skills, ability to obtain effective social support, and compliance with psychotropic medications if prescribed.          Core Components/Risk Factors/Patient Goals Review:   Goals and Risk Factor Review     Row Name 03/13/24 1543 04/11/24 0916 05/08/24 1205         Core Components/Risk Factors/Patient Goals Review   Personal Goals Review Weight Management/Obesity;Heart Failure;Lipids;Diabetes;Hypertension Weight Management/Obesity;Heart Failure;Lipids;Diabetes;Hypertension Weight Management/Obesity;Heart Failure;Lipids;Diabetes;Hypertension     Review [name] is doing fair with exercise for his fitness level. Vital signs have been stable. No further ventricular ectopy noted during exercise since 02/24/24 [name] continues to do well with exercise for his fitness level. Vital signs remain stable. [name] has gained 1.3 kg since starting the program [name] continues to do well with exercise for his fitness level. Vital signs remain stable. [name] has lost 1.2 kg since starting the program. [name] will complete cardiac rehab on 05/09/24     Expected Outcomes [name] will continue to particpate in cardiac rehab for exercise, nutrition and lifestyle modifications [name] will continue to particpate in cardiac rehab for exercise,  nutrition and lifestyle modifications [name] will continue to particpate in cardiac rehab for exercise, nutrition and lifestyle modifications        Core Components/Risk Factors/Patient Goals at Discharge (Final Review):   Goals and Risk Factor Review - 05/08/24 1205       Core Components/Risk Factors/Patient Goals Review   Personal Goals Review Weight Management/Obesity;Heart Failure;Lipids;Diabetes;Hypertension    Review [name] continues to do well with exercise for his fitness level. Vital signs remain stable. [name] has lost 1.2 kg since starting the program. 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 Introduction to [facility]. Initial orientation packet reviewed with patient 30 Day ITP Review. Michelangelo has good participation for his fitness level at cardiac rehab. Clair remains somewhat deconditioned. 30 Day ITP Review. Lando continues to have good participation  with exercise for his fitness level at cardiac rehab. Cesare remains somewhat deconditioned. 30 Day ITP Review. Nyxon continues to have good participation  with exercise for his fitness level at cardiac rehab. Amaurie remains somewhat deconditioned. Maliek will complete cardiac rehab on 05/09/24       Comments: See ITP comments.Hadassah Elpidio Quan RN BSN

## 2024-05-09 ENCOUNTER — Encounter (HOSPITAL_COMMUNITY)
Admission: RE | Admit: 2024-05-09 | Discharge: 2024-05-09 | Disposition: A | Source: Ambulatory Visit | Attending: Cardiovascular Disease

## 2024-05-09 DIAGNOSIS — Z955 Presence of coronary angioplasty implant and graft: Secondary | ICD-10-CM

## 2024-05-09 DIAGNOSIS — I214 Non-ST elevation (NSTEMI) myocardial infarction: Secondary | ICD-10-CM

## 2024-05-10 NOTE — Progress Notes (Signed)
 Otolaryngology Clinic Note  HPI:     [name] is a 89 y.o. male who presents as a new patient, referred by Onita Norleen Sharper, MD, for evaluation and treatment of bilateral cerumen impaction per patient he says that he has hearing aids many years ago that he feels are not working well he also has says his ears were last cleared by ear irrigation 2 years ago.  He feels that both ears are impacted with the right more so than the left he denies tinnitus sudden changes in his baseline hearing ear pain ear drainage or other acute ear complaint he denies any history of ear infections or ear surgeries.  PMH/Meds/All/SocHx/FamHx/ROS:   Medical History[1]  Surgical History[2]  No family history of bleeding disorders, wound healing problems or difficulty with anesthesia.   Social History   Socioeconomic History  . Marital status: Single    Spouse name: Not on file  . Number of children: Not on file  . Years of education: Not on file  . Highest education level: Not on file  Occupational History  . Not on file  Tobacco Use  . Smoking status: Never  . Smokeless tobacco: Never  Substance and Sexual Activity  . Alcohol  use: Not Currently  . Drug use: Never  . Sexual activity: Not on file  Other Topics Concern  . Not on file  Social History Narrative  . Not on file   Social Drivers of Health   Food Insecurity: No Food Insecurity (12/22/2023)   Received from [facility]   Food vital sign   . Within the past 12 months, you worried that your food would run out before you got money to buy more: Never true   . Within the past 12 months, the food you bought just didn't last and you didn't have money to get more: Never true  Transportation Needs: No Transportation Needs (12/22/2023)   Received from [facility] - Transportation   . In the past 12 months, has lack of transportation kept you from medical appointments or from getting medications?: No   . In the past 12 months, has  lack of transportation kept you from meetings, work, or from getting things needed for daily living?: No  Safety: Not At Risk (12/22/2023)   Received from [facility]   Safety   . Within the last year, have you been afraid of your partner or ex-partner?: No   . Within the last year, have you been humiliated or emotionally abused in other ways by your partner or ex-partner?: No   . Within the last year, have you been kicked, hit, slapped, or otherwise physically hurt by your partner or ex-partner?: No   . Within the last year, have you been raped or forced to have any kind of sexual activity by your partner or ex-partner?: No  Living Situation: Low Risk  (12/22/2023)   Received from [facility] Situation   . In the last 12 months, was there a time when you were not able to pay the mortgage or rent on time?: No   . In the past 12 months, how many times have you moved where you were living?: 0   . At any time in the past 12 months, were you homeless or living in a shelter (including now)?: No    Current Medications[3]  A complete ROS was performed with pertinent positives/negatives noted in the HPI. The remainder of the ROS are negative.  Physical Exam:    There were no vitals taken for this visit.   General: Well developed, well nourished. No acute distress. Voice normal.  Head/Face: Normocephalic, atraumatic. No scars or lesions. No sinus tenderness. Facial nerve intact and equal bilaterally. No facial lacerations. TMJ nontender to palpation.  Eyes: Pupils are equal, round and reactive to light. Conjunctiva and lids are normal. Normal extraocular mobility.   Ears:   Right: Pinna and external meatus normal, normal ear canal skin and caliber without drainage. Impacted cerumen, cleared under otomicroscopy, once cleared tympanic membrane intact and without effusion.   Left: Pinna and external meatus normal, normal ear canal skin and caliber without drainage. Impacted cerumen,  cleared under otomicroscopy, once cleared tympanic membrane intact and without effusion.  Hearing: Normal speech reception to conversational tone.  Nose: No gross deformity or lesions. No purulent discharge. Septum midline, no turbinate hypertrophy with normal mucosa.  Mouth/Oropharynx: Lips normal, teeth and gums normal with good dentition, normal oral vestibule. Normal floor of mouth, tongue and oral mucosa, no mucosal lesions, ulcer or mass, normal tongue mobility. Uvula midline. Hard and soft palate normal with normal mobility. +1 tonsils, no erythema or exudate.  Larynx: See TFL if applicable  Nasopharynx: See TFL if applicable  Neck: Trachea midline. No masses. No crepitus. Normal thyroid gland palpation without thyromegaly or nodules palpated. Salivary glands normal to palpation without swelling, erythema or mass.   Lymphatic: No lymphadenopathy in the neck.  Respiratory: No stridor or distress.  Extremities: No edema or cyanosis. Warm and well-perfused.  Neurologic: CN II-XII intact. Alert and oriented to self, place and time.  Normal reflexes and motor skills, balance and coordination. Moving all extremities without gross abnormality.  Psychiatric:  No unusual anxiety or evidence of depression. Appropriate affect.    Independent Review of Additional Tests or Records:   Medical Records: None  Procedures:   Ear Cerumen Removal  Date/Time: 05/10/2024 10:30 AM  Performed by: Olam Vina Simpler, NP Authorized by: Olam Vina Simpler, NP   Procedure Details:  Impacted cerumen: Yes  Cerumen removal location: bilateral Procedure type: curette (suction 5-0, 7-0, forceps)  Post Procedure Details:  Procedure findings: complete impaction removal Patient tolerance of procedure: patient tolerated the procedure without difficulty Comments: The patient was positioned and the ear was examined with the microscope. Obstructing cerumen was gently removed using suction from both ear canals. TMs  fully visualized, normal and intact. Middle ears aerated. No sign of infection or cholesteatoma.     Impression & Plans:  [name] is a 89 y.o. male with   1. Bilateral impacted cerumen      2. Conductive hearing loss, bilateral       Assessment & Plan Patient has bilateral cerumen impaction and resulting conductive hearing loss he is a hearing aid wearer and reports obtaining hearing aids many years ago he last had his ears cleaned out with water irrigation about 2 years ago and reports that occasionally he gets cerumen impactions at this point he declines getting updated audiogram or consideration for a hearing aid consult did advise methods to minimize cerumen if impaction including the use of olive oil or mineral oil couple drops every 3 to 4 weeks for cerumen management he may return to ENT as needed.  Medical Decision Making  Patient is here for a new patient visit for evaluation and treatment of:   A. 2 acute uncomplicated illnesses, level 3: 99203   B. Review and analysis of results:   No unique  test ordered no outside notes reviewed this is a low level of complexity of data for analysis and review. Level 2: 99202  C: Risk of Complications: Over-the-counter drugs or treatments or products were recommended including: Olive oil or mineral oil used periodically for cerumen management, which poses a low risk of morbidity or mortality, level 3: 99203  Overall level of service: [phone]   On the day of the visit I spent 35 minutes preparing to see the patient, performing a medically appropriate examination and evaluation, and documenting clinical information in the electronic medical record.This time does not include any time spent performing procedures or assesments that are separately billable.     Electronically signed by: Olam Vina Simpler, NP 05/10/2024 10:28 AM            [1] Past Medical History: Diagnosis Date  . Diabetes mellitus    (CMD)   . Hypertension    [2] Past Surgical History: Procedure Laterality Date  . STOMACH SURGERY     Procedure: STOMACH SURGERY  [3]  Current Outpatient Medications:  .  alfuzosin  (UROXATRAL ) 10 mg Tb24 24 hour tablet, Take 10 mg by mouth., Disp: , Rfl:  .  amiodarone  (PACERONE ) 200 mg tablet, Take 200 mg twice a day for 10 days thereafter continue once a day only, Disp: , Rfl:  .  atorvastatin  (LIPITOR) 20 mg tablet, Take 20 mg by mouth., Disp: , Rfl:  .  Eliquis  5 mg tab, TK 1 T PO BID, Disp: , Rfl:  .  furosemide  (LASIX ) 20 mg tablet, TAKE 4 TABLETS BY MOUTH IN THE MORNING AND 2 TABLETS AT 2PM THROUGH THE 30TH, Disp: , Rfl:  .  gabapentin  (NEURONTIN ) 100 mg capsule, Take 100 mg by mouth., Disp: , Rfl:  .  metFORMIN  (GLUCOPHAGE ) 500 mg tablet, Take 500 mg by mouth., Disp: , Rfl:  .  metoprolol  succinate (TOPROL  XL) 50 mg 24 hr tablet, Take 50 mg by mouth., Disp: , Rfl:  .  potassium chloride  (KLOR-CON ) 20 mEq ER tablet, TK 1 T PO QD, Disp: , Rfl:  .  spironolactone  (ALDACTONE ) 25 mg tablet, TAKE 1 TABLET BY MOUTH DAILY FOR HEART FAILURE, Disp: , Rfl:

## 2024-05-11 ENCOUNTER — Encounter (HOSPITAL_COMMUNITY)
Admission: RE | Admit: 2024-05-11 | Discharge: 2024-05-11 | Disposition: A | Discharge status: Home / Self Care | Source: Ambulatory Visit | Attending: Cardiovascular Disease | Admitting: Cardiovascular Disease

## 2024-05-11 DIAGNOSIS — I214 Non-ST elevation (NSTEMI) myocardial infarction: Secondary | ICD-10-CM | POA: Diagnosis not present

## 2024-05-11 DIAGNOSIS — Z955 Presence of coronary angioplasty implant and graft: Secondary | ICD-10-CM

## 2024-05-11 NOTE — Progress Notes (Signed)
 Discharge Progress Report  Patient Details  Name: [name] MRN: [id number] Date of Birth: 05/07/1935 Referring Provider:   Flowsheet Row INTENSIVE CARDIAC REHAB ORIENT from 02/16/2024 in [facility]  Referring Provider Ozell Fell, MD     Number of Visits: 41  Reason for Discharge:  Patient reached a stable level of exercise.  Smoking History:  Social History   Tobacco Use  Smoking Status Former   Types: Pipe   Quit date: 07/05/1984   Years since quitting: 39.8  Smokeless Tobacco Never    Diagnosis:  NSTEMI (non-ST elevated myocardial infarction) (HCC)  Status post coronary artery stent placement  ADL [facility]:   Initial Exercise Prescription:  Initial Exercise Prescription - 02/16/24 1500       Date of Initial Exercise RX and Referring Provider   Date 02/16/24    Referring Provider Ozell Fell, MD    Expected Discharge Date 05/09/24      NuStep   Level 1    SPM 60    Minutes 25    METs 1.9      Prescription Details   Frequency (times per week) 3    Duration Progress to 30 minutes of continuous aerobic without signs/symptoms of physical distress      Intensity   THRR 40-80% of Max Heartrate 54-107    Ratings of Perceived Exertion 11-13    Perceived Dyspnea 0-4      Progression   Progression Continue progressive overload as per policy without signs/symptoms or physical distress.      Resistance Training   Training Prescription Yes    Weight 2    Reps 10-15          Discharge Exercise Prescription (Final Exercise Prescription Changes):  Exercise Prescription Changes - 04/25/24 1654       Response to Exercise   Blood Pressure (Admit) 120/66    Blood Pressure (Exit) 108/70    Heart Rate (Admit) 71 bpm    Heart Rate (Exercise) 89 bpm    Heart Rate (Exit) 62 bpm    Rating of Perceived Exertion (Exercise) 11    Perceived Dyspnea (Exercise) 0    Symptoms 0    Comments REVD MET's     Duration Progress to 30 minutes of  aerobic without signs/symptoms of physical distress    Intensity THRR unchanged      Progression   Progression Continue to progress workloads to maintain intensity without signs/symptoms of physical distress.    Average METs 2.1      Resistance Training   Training Prescription Yes    Weight 3    Reps 10-15    Time 10 Minutes      NuStep   Level 4    SPM 75    Minutes 30    METs 2.1      Home Exercise Plan   Plans to continue exercise at Home (comment)    Frequency Add 2 additional days to program exercise sessions.    Initial Home Exercises Provided 03/19/24          Functional Capacity:  6 Minute Walk     Row Name 02/16/24 1315 05/07/24 1645       6 Minute Walk   Phase Initial Discharge  used 4 wheel walker    Distance 840 feet 1060 feet    Distance % Change -- 26.19 %    Distance Feet Change -- 220 ft    Walk  Time 6 minutes 6 minutes    # of Rest Breaks 0 0    MPH 16 2.01    METS 1.23 1.6    RPE 11 11    Perceived Dyspnea  0 0    VO2 Peak 4.3 5.56    Symptoms No No    Resting HR 68 bpm 64 bpm    Resting BP 142/82 134/72    Resting Oxygen Saturation  96 % --    Exercise Oxygen Saturation  during 6 min walk 97 % --    Max Ex. HR 100 bpm 112 bpm    Max Ex. BP 160/80 144/80    2 Minute Post BP 142/84 --       Psychological, QOL, Others - Outcomes: PHQ 2/9:    05/11/2024    4:15 PM 02/16/2024    3:24 PM  Depression screen PHQ 2/9  Decreased Interest 0 2  Down, Depressed, Hopeless 0 2  PHQ - 2 Score 0 4  Altered sleeping 0 3  Tired, decreased energy 1 3  Change in appetite 0 1  Feeling bad or failure about yourself  1 0  Trouble concentrating 0 0  Moving slowly or fidgety/restless 1 0  Suicidal thoughts 0 0  PHQ-9 Score 3 11   Difficult doing work/chores  Not difficult at all     Data saved with a previous flowsheet row definition    Quality of Life:  Quality of Life - 05/10/24 1534       Quality of  Life   Select Quality of Life      Quality of Life Scores   Health/Function Post 14.5 %    Socioeconomic Post 24.5 %    Psych/Spiritual Post 20.57 %    Family Post 17.5 %    GLOBAL Post 17.79 %          Personal Goals: Goals established at orientation with interventions provided to work toward goal.  Personal Goals and Risk Factors at Admission - 02/16/24 1601       Core Components/Risk Factors/Patient Goals on Admission    Weight Management Yes;Obesity    Intervention Weight Management: Provide education and appropriate resources to help participant work on and attain dietary goals.;Weight Management: Develop a combined nutrition and exercise program designed to reach desired caloric intake, while maintaining appropriate intake of nutrient and fiber, sodium and fats, and appropriate energy expenditure required for the weight goal.;Weight Management/Obesity: Establish reasonable short term and long term weight goals.;Obesity: Provide education and appropriate resources to help participant work on and attain dietary goals.    Expected Outcomes Long Term: Adherence to nutrition and physical activity/exercise program aimed toward attainment of established weight goal;Short Term: Continue to assess and modify interventions until short term weight is achieved;Understanding recommendations for meals to include 15-35% energy as protein, 25-35% energy from fat, 35-60% energy from carbohydrates, less than 200mg  of dietary cholesterol, 20-35 gm of total fiber daily;Understanding of distribution of calorie intake throughout the day with the consumption of 4-5 meals/snacks    Diabetes Yes    Intervention Provide education about signs/symptoms and action to take for hypo/hyperglycemia.;Provide education about proper nutrition, including hydration, and aerobic/resistive exercise prescription along with prescribed medications to achieve blood glucose in normal ranges: Fasting glucose 65-99 mg/dL     Expected Outcomes Short Term: Participant verbalizes understanding of the signs/symptoms and immediate care of hyper/hypoglycemia, proper foot care and importance of medication, aerobic/resistive exercise and nutrition plan for blood glucose control.;Long Term:  Attainment of HbA1C < 7%.    Heart Failure Yes    Intervention Provide a combined exercise and nutrition program that is supplemented with education, support and counseling about heart failure. Directed toward relieving symptoms such as shortness of breath, decreased exercise tolerance, and extremity edema.    Expected Outcomes Improve functional capacity of life;Short term: Attendance in program 2-3 days a week with increased exercise capacity. Reported lower sodium intake. Reported increased fruit and vegetable intake. Reports medication compliance.;Short term: Daily weights obtained and reported for increase. Utilizing diuretic protocols set by physician.;Long term: Adoption of self-care skills and reduction of barriers for early signs and symptoms recognition and intervention leading to self-care maintenance.    Hypertension Yes    Intervention Provide education on lifestyle modifcations including regular physical activity/exercise, weight management, moderate sodium restriction and increased consumption of fresh fruit, vegetables, and low fat dairy, alcohol  moderation, and smoking cessation.;Monitor prescription use compliance.    Expected Outcomes Short Term: Continued assessment and intervention until BP is < 140/63mm HG in hypertensive participants. < 130/60mm HG in hypertensive participants with diabetes, heart failure or chronic kidney disease.;Long Term: Maintenance of blood pressure at goal levels.    Lipids Yes    Intervention Provide education and support for participant on nutrition & aerobic/resistive exercise along with prescribed medications to achieve LDL 70mg , HDL >40mg .    Expected Outcomes Short Term: Participant states  understanding of desired cholesterol values and is compliant with medications prescribed. Participant is following exercise prescription and nutrition guidelines.;Long Term: Cholesterol controlled with medications as prescribed, with individualized exercise RX and with personalized nutrition plan. Value goals: LDL < 70mg , HDL > 40 mg.    Stress Yes    Intervention Offer individual and/or small group education and counseling on adjustment to heart disease, stress management and health-related lifestyle change. Teach and support self-help strategies.;Refer participants experiencing significant psychosocial distress to appropriate mental health specialists for further evaluation and treatment. When possible, include family members and significant others in education/counseling sessions.    Expected Outcomes Long Term: Emotional wellbeing is indicated by absence of clinically significant psychosocial distress or social isolation.;Short Term: Participant demonstrates changes in health-related behavior, relaxation and other stress management skills, ability to obtain effective social support, and compliance with psychotropic medications if prescribed.           Personal Goals Discharge:  Goals and Risk Factor Review     Row Name 03/13/24 1543 04/11/24 0916 05/08/24 1205         Core Components/Risk Factors/Patient Goals Review   Personal Goals Review Weight Management/Obesity;Heart Failure;Lipids;Diabetes;Hypertension Weight Management/Obesity;Heart Failure;Lipids;Diabetes;Hypertension Weight Management/Obesity;Heart Failure;Lipids;Diabetes;Hypertension     Review [name] is doing fair with exercise for his fitness level. Vital signs have been stable. No further ventricular ectopy noted during exercise since 02/24/24 [name] continues to do well with exercise for his fitness level. Vital signs remain stable. [name] has gained 1.3 kg since starting the program [name] continues to do well with exercise for his  fitness level. Vital signs remain stable. [name] has lost 1.2 kg since starting the program. [name] will complete cardiac rehab on 05/09/24     Expected Outcomes [name] will continue to particpate in cardiac rehab for exercise, nutrition and lifestyle modifications [name] will continue to particpate in cardiac rehab for exercise, nutrition and lifestyle modifications [name] will continue to particpate in cardiac rehab for exercise, nutrition and lifestyle modifications        Exercise Goals and Review:  Exercise Goals  Row Name 02/16/24 1600             Exercise Goals   Increase Physical Activity Yes       Intervention Develop an individualized exercise prescription for aerobic and resistive training based on initial evaluation findings, risk stratification, comorbidities and participant's personal goals.;Provide advice, education, support and counseling about physical activity/exercise needs.       Expected Outcomes Long Term: Exercising regularly at least 3-5 days a week.;Short Term: Attend rehab on a regular basis to increase amount of physical activity.;Long Term: Add in home exercise to make exercise part of routine and to increase amount of physical activity.       Increase Strength and Stamina Yes       Intervention Provide advice, education, support and counseling about physical activity/exercise needs.;Develop an individualized exercise prescription for aerobic and resistive training based on initial evaluation findings, risk stratification, comorbidities and participant's personal goals.       Expected Outcomes Short Term: Increase workloads from initial exercise prescription for resistance, speed, and METs.;Short Term: Perform resistance training exercises routinely during rehab and add in resistance training at home;Long Term: Improve cardiorespiratory fitness, muscular endurance and strength as measured by increased METs and functional capacity ( )       Able to understand and use  rate of perceived exertion (RPE) scale Yes       Intervention Provide education and explanation on how to use RPE scale       Expected Outcomes Short Term: Able to use RPE daily in rehab to express subjective intensity level;Long Term:  Able to use RPE to guide intensity level when exercising independently       Knowledge and understanding of Target Heart Rate Range (THRR) Yes       Intervention Provide education and explanation of THRR including how the numbers were predicted and where they are located for reference       Expected Outcomes Short Term: Able to state/look up THRR;Long Term: Able to use THRR to govern intensity when exercising independently;Short Term: Able to use daily as guideline for intensity in rehab       Understanding of Exercise Prescription Yes       Intervention Provide education, explanation, and written materials on patient's individual exercise prescription       Expected Outcomes Short Term: Able to explain program exercise prescription;Long Term: Able to explain home exercise prescription to exercise independently          Exercise Goals Re-Evaluation:  Exercise Goals Re-Evaluation     Row Name 02/20/24 1647 03/08/24 0934 03/19/24 1649 04/09/24 1646 05/07/24 1652     Exercise Goal Re-Evaluation   Exercise Goals Review Increase Physical Activity;Understanding of Exercise Prescription;Increase Strength and Stamina;Knowledge and understanding of Target Heart Rate Range (THRR);Able to understand and use rate of perceived exertion (RPE) scale Increase Physical Activity;Understanding of Exercise Prescription;Increase Strength and Stamina;Knowledge and understanding of Target Heart Rate Range (THRR);Able to understand and use rate of perceived exertion (RPE) scale Increase Physical Activity;Understanding of Exercise Prescription;Increase Strength and Stamina;Knowledge and understanding of Target Heart Rate Range (THRR);Able to understand and use rate of perceived exertion  (RPE) scale Increase Physical Activity;Understanding of Exercise Prescription;Increase Strength and Stamina;Knowledge and understanding of Target Heart Rate Range (THRR);Able to understand and use rate of perceived exertion (RPE) scale Increase Physical Activity;Understanding of Exercise Prescription;Increase Strength and Stamina;Knowledge and understanding of Target Heart Rate Range (THRR);Able to understand and use rate of perceived exertion (RPE) scale  Comments Pt first day in the [facility] program. Pt tolerated exercise well with an average MET level of 1.4.  He is off to a good start and is learning his THRR, RPE and ExRx Reviewed MET's and goals. Pt tolerated exercise well with an average MET level of 1.7.  Patient is doing well and making gradual progression, talked about a WL increase next week. So far, he does not feel a change in his goals, but encouraged we are early on and he has increased his time from 25 to 30 mins and increased MET's. Will work on gradual progression overtime Reviewed MET's and goals. Pt tolerated exercise well with an average MET level of 1.7.  Patient is doing well and making gradual progression, talked about a WL increase next week. So far, he does not feel a change in his goals, but encouraged we are early on and he has increased his time from 25 to 30 mins and increased MET's. Will work on gradual progression overtime Reviewed MET's and goals. Pt tolerated exercise well with an average MET level of 1.7.  Continueing to encourage [facility] SPM to increase stamina overall. He is able to do come general house work, but does have a cleaner cone to help with some of the bigger tasks. His SOB has become much more manageable. Walk test today and brief  review MET's and goals. Pt tolerated exercise well with an average MET level of 1.6. When he graduates the end of the week, working on a plan for continued exercise. Gave him info today on balance and vestibular training through [facility]. He will read over and let me know   Expected Outcomes Will continue to monitor pt and progress workloads as tolerated without sign or symptom Will continue to monitor pt and progress workloads as tolerated without sign or symptom Will continue to monitor pt and progress workloads as tolerated without sign or symptom Will continue to monitor pt and progress workloads as tolerated without sign or symptom Will continue to monitor pt and progress workloads as tolerated without sign or symptom      Nutrition & Weight - Outcomes:  Pre Biometrics - 02/16/24 1400       Pre Biometrics   Waist Circumference 45 inches    Hip Circumference 47 inches    Waist to Hip Ratio 0.96 %    Triceps Skinfold 26 mm    % Body Fat 33.9 %    Grip Strength 22 kg    Flexibility 0 in    Single Leg Stand 1 seconds          Post Biometrics - 05/07/24 1651        Post  Biometrics   Height 6' (1.829 m)    Weight 105.5 kg    Waist Circumference 46 inches    Hip Circumference 46.5 inches    Waist to Hip Ratio 0.99 %    BMI (Calculated) 31.54    Triceps Skinfold 16 mm    % Body Fat 32.3 %    Grip Strength 18 kg    Flexibility 0 in    Single Leg Stand --   did not attempt. Assist Device         Nutrition:  Nutrition Therapy & Goals - 02/20/24 1542       Nutrition Therapy   Diet Heart Healthy Diet      Personal Nutrition Goals   Nutrition Goal Patient to identify strategies for reducing cardiovascular risk by attending  the [facility] education and nutrition series weekly.    Personal Goal #2 Patient to improve diet quality by using the plate method as a guide for meal planning to include lean protein/plant protein, fruits, vegetables, whole grains, nonfat dairy as part of a well-balanced diet.    Comments Patient has medical history of RBBB, hyperlipidemia, PAH, HTN, Afib, CHF, Severe MR s/p MTEER, CKD3. A1c is in a diabetic range. Lipids from 2024 and have not been re-checked since starting zetia   08/2023; previously they were not at goal. Patient will benefit from participation in intensive cardiac rehab for nutrition education, exercise, and lifestyle modification.      Intervention Plan   Intervention Prescribe, educate and counsel regarding individualized specific dietary modifications aiming towards targeted core components such as weight, hypertension, lipid management, diabetes, heart failure and other comorbidities.;Nutrition handout(s) given to patient.    Expected Outcomes Short Term Goal: Understand basic principles of dietary content, such as calories, fat, sodium, cholesterol and nutrients.;Long Term Goal: Adherence to prescribed nutrition plan.          Nutrition Discharge:  Nutrition Assessments - 05/10/24 1536       Rate Your Plate Scores   Pre Score 69    Post Score 71          Education Questionnaire Score:  Knowledge Questionnaire Score - 05/10/24 1537       Knowledge Questionnaire Score   Post Score 16/24          Goals reviewed with patient; copy given to patient.Pt graduates from  Intensive/Traditional cardiac rehab program on 05/11/24  with completion of  55 exercise and education sessions. Pt maintained good attendance and progressed nicely during his participation in rehab as evidenced by increased MET level. [name] increased his distance on his post exercise walk test by 220 feet.    Medication list reconciled. Repeat  PHQ score- 3 .  Pt has made some lifestyle changes and should be commended for his success.  [name] achieved his goals during cardiac rehab.   Pt plans to continue exercise as he can . [name] was given information on the [facility] Balance and vestibulare rehab for fall prevention.Hadassah Elpidio Quan RN BSN

## 2024-05-17 ENCOUNTER — Ambulatory Visit: Attending: Registered Nurse

## 2024-05-17 VITALS — BP 128/62

## 2024-05-17 DIAGNOSIS — R2681 Unsteadiness on feet: Secondary | ICD-10-CM | POA: Diagnosis present

## 2024-05-17 DIAGNOSIS — R2689 Other abnormalities of gait and mobility: Secondary | ICD-10-CM | POA: Diagnosis present

## 2024-05-17 DIAGNOSIS — R293 Abnormal posture: Secondary | ICD-10-CM | POA: Insufficient documentation

## 2024-05-17 DIAGNOSIS — M6281 Muscle weakness (generalized): Secondary | ICD-10-CM | POA: Insufficient documentation

## 2024-05-17 DIAGNOSIS — Z9181 History of falling: Secondary | ICD-10-CM | POA: Insufficient documentation

## 2024-05-17 NOTE — Progress Notes (Addendum)
 OUTPATIENT PHYSICAL THERAPY VESTIBULAR EVALUATION     Patient Name: [name] MRN: [id number] DOB:[birth year], 89 y.o., male Today's Date: 05/17/2024  END OF SESSION:  PT End of Session - 05/17/24 1836     Visit Number 1    Number of Visits 9    Date for Recertification  06/15/24    Authorization Type [employer] MEDICARE    PT Start Time 1316    PT Stop Time 1400    PT Time Calculation (min) 44 min    Equipment Utilized During Treatment Gait belt    Activity Tolerance Patient tolerated treatment well    Behavior During Therapy WFL for tasks assessed/performed          Past Medical History:  Diagnosis Date   (HFimpEF) heart failure with improved EF    Echo 04/2019: EF 35-40 // Echo 5/22: EF 55-60, no RWMA, GR 1 DD, moderate to severe MR, myxomatous mitral valve, similar to prior echocardiogram, mildly elevated PASP, RVSP 35.9, mild AI, mild AV sclerosis without AS, mild dilation of ascending aorta (43 mm)   A-fib (HCC)    RVR-DCCV   Abnormal PSA 01/17/2012   10.38/4/5/ PSA:2013=6.96/ PSA 07/08/2011=6.26/ PSA 2006=8.6   Ascending aorta dilation 09/16/2021   Echocardiogram March 2023: 41 mm   Blood transfusion without reported diagnosis    during vagotomy 1976   CAD (coronary artery disease)    Chronic kidney disease    nephrolithiasis right kidney   Coronary artery disease    ED (erectile dysfunction)    GERD (gastroesophageal reflux disease)    PUD S/P PARTIAL VAGOTOMY   Gynecomastia 04/21/2011   seen by Dr.Murray, no rad tx   H/O impacted cerumen    S/P ENT DR BATES   History of echocardiogram    echo 4/16:  mild LVH, EF 55-60%, Gr 1 DD, no RWMA, mild AI, mild MR, mod LAE, mild TR   Hydroureteronephrosis    right    Hyperlipidemia    Hypertension    Insomnia    Migraines    WITH AURA   Nephrolithiasis    Osteopenia    Overweight    Prostate cancer (HCC) 12/09/03 dx   Prostate ca/adenocarcinoma,gleason=3+3=6    pSA 4.6   RBBB (right bundle branch block)     Rosacea    S/P mitral valve clip implantation 12/22/2023   s/p Mitraclip NTW x 2 with a reduction from 3-4+ MR to 1 + residual MR.   Sinus bradycardia    Sleep apnea    pt unaware, no sleep study   Stroke [facility])    asymptomatic, found on head imaging   Past Surgical History:  Procedure Laterality Date   ABDOMINAL AORTIC ENDOVASCULAR STENT GRAFT Right 10/28/2021   Procedure: ABDOMINAL AORTIC ENDOVASCULAR GRAFT WITH RIGHT EPIGASTRIC ARTERY COILING;  Surgeon: Magda Debby SAILOR, MD;  Location: [facility] OR;  Service: Vascular;  Laterality: Right;   CARDIAC CATHETERIZATION     CARDIOVERSION N/A 06/19/2018   Procedure: CARDIOVERSION;  Surgeon: Rolan Ezra RAMAN, MD;  Location: [facility] ENDOSCOPY;  Service: Cardiovascular;  Laterality: N/A;   PROSTATE BIOPSY  12/09/2003   adenocarcinoma,glerason: 3+3=6   RIGHT HEART CATH AND CORONARY ANGIOGRAPHY N/A 11/14/2023   Procedure: RIGHT HEART CATH AND CORONARY ANGIOGRAPHY;  Surgeon: Wonda Sharper, MD;  Location: [facility] INVASIVE CV LAB;  Service: Cardiovascular;  Laterality: N/A;   RIGHT/LEFT HEART CATH AND CORONARY ANGIOGRAPHY N/A 04/25/2019   Procedure: RIGHT/LEFT HEART CATH AND CORONARY ANGIOGRAPHY;  Surgeon: Anner Alm ORN, MD;  Location:  [facility] INVASIVE CV LAB;  Service: Cardiovascular;  Laterality: N/A;   TEE WITHOUT CARDIOVERSION N/A 06/19/2018   Procedure: TRANSESOPHAGEAL ECHOCARDIOGRAM (TEE);  Surgeon: Rolan Ezra RAMAN, MD;  Location: [facility] ENDOSCOPY;  Service: Cardiovascular;  Laterality: N/A;   TRANSCATHETER MITRAL EDGE TO EDGE REPAIR N/A 12/21/2023   Procedure: TRANSCATHETER MITRAL EDGE TO EDGE REPAIR;  Surgeon: Wonda Sharper, MD;  Location: [facility] INVASIVE CV LAB;  Service: Cardiovascular;  Laterality: N/A;   TRANSESOPHAGEAL ECHOCARDIOGRAM (CATH LAB) N/A 10/14/2023   Procedure: TRANSESOPHAGEAL ECHOCARDIOGRAM;  Surgeon: Francyne Headland, MD;  Location: [facility] INVASIVE CV LAB;  Service: Cardiovascular;  Laterality: N/A;   TRANSESOPHAGEAL ECHOCARDIOGRAM (CATH LAB) N/A  12/21/2023   Procedure: TRANSESOPHAGEAL ECHOCARDIOGRAM;  Surgeon: Wonda Sharper, MD;  Location: [facility] INVASIVE CV LAB;  Service: Cardiovascular;  Laterality: N/A;   VAGOTOMY  07/05/1976   partial   Patient Active Problem List   Diagnosis Date Noted   S/P mitral valve clip implantation 12/22/2023   Overweight    Severe mitral insufficiency 12/21/2023   Abdominal aortic aneurysm 10/28/2021   Heart failure with improved ejection fraction (HFimpEF) (HCC) 09/16/2021   Coronary artery disease involving native coronary artery of native heart without angina pectoris 09/16/2021   Ascending aorta dilation 09/16/2021   Acute on chronic combined systolic and diastolic CHF (congestive heart failure) (HCC) 08/10/2018   Atrial fibrillation (HCC) 06/14/2018   PAH (pulmonary artery hypertension) (HCC) 06/13/2018   Essential hypertension 06/13/2018   HLD (hyperlipidemia) 07/20/2015   Diabetes mellitus without complication (HCC) 07/20/2015   RBBB 10/22/2014   Stage 3a chronic kidney disease (HCC) 10/20/2014   Hydroureteronephrosis    Hx of Prostate cancer    Abnormal PSA 01/17/2012    PCP: Onita Rush, MD REFERRING PROVIDER: Verta Izetta HERO, NP  REFERRING DIAG: R26.89 (ICD-10-CM) - Other abnormalities of gait and mobility   THERAPY DIAG:  Unsteadiness on feet  Muscle weakness (generalized)  History of falling  Abnormal posture  Other abnormalities of gait and mobility  ONSET DATE: 05/09/2024  Rationale for Evaluation and Treatment: Rehabilitation  SUBJECTIVE:   SUBJECTIVE STATEMENT: Patient ambulates into clinic with SPC. He stated he had ears cleaned out the other day. He also feels like he has stones in his shoes and blames this on neuropathy. 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 At the airbnb there are 4 floors, lots of stairs, he lives in basement, with 12 or 13 steps each floor with railings. He had a falls about 6 months ago while walking on driveway. He started  using the cane since about 6 months ago periodically after that fall because he is nervous about his balance. He really wants to get better with walking and not feeling scared of falling because he says he always trips over his feet. He has trouble hearing, especially male voices due to higher tone. 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  PRECAUTIONS: Fall and Other: osteopenia, afib  FALLS: Has patient fallen in last 6 months? Yes. Number of falls 1  LIVING ENVIRONMENT: Lives with: Rents out airbnb with 4 floors and pt lives in basement  and rents out rooms in his home Lives in: House/apartment Stairs: Yes: Internal: 64 (4 floors of steps) steps; has railings Has following equipment at home: Single point cane  PLOF: Independent  PATIENT GOALS: Wants to feel confident when he walks and wants his legs to feel stronger. He wants to be stronger going up steps and not feel like he is always going to trip over his feet.  OBJECTIVE:  Note: Objective measures were completed at Evaluation unless otherwise noted.  DIAGNOSTIC FINDINGS: No recent brain or cervical imaging available. Most recent is 10/31/2018: IMPRESSION: 1. No acute abnormality to explain syncopal episode. 2. Remote lacunar infarcts involving the right centrum semi ovale and the left basal ganglia. 3. Moderate atrophy and white matter disease likely reflects the sequela of chronic microvascular ischemia. 4. Multiple punctate foci of susceptibility  consistent with remote foci of punctate hemorrhage suggest an underlying vasculitis. No acute hemorrhage is present.  COGNITION: Overall cognitive status: Within functional limits for tasks assessed  POSTURE:  rounded shoulders, forward head, and flexed trunk   GAIT: Gait pattern: decreased step length- Right, decreased step length- Left, trunk flexed, wide BOS, poor foot clearance- Right, and poor foot clearance- Left Distance walked: clinic distance Assistive device utilized: Single point cane Level of assistance: SBA Comments: Unsteady gait, forward posture, decreased step length and wide base of support  FUNCTIONAL TESTS:  5 times sit to stand: 39.13 seconds UE support, SBA, no assistance needed Timed up and go (TUG): 19.62 seconds, SPC, SBA, no assistance needed  PATIENT SURVEYS:  ABC scale: The Activities-Specific Balance Confidence (ABC) Scale 0% 10 20 30  40 50 60 70 80 90 100% No confidence<->completely confident  "How confident are you that you will not lose your balance or become unsteady when you . . .   Date tested 05/17/2024  Walk around the house 40%  2. Walk up or down stairs 20%  3. Bend over and pick up a slipper from in front of a closet floor 30%  4. Reach for a small can off a shelf at eye level 80%  5. Stand on tip toes and reach for something above your head 50%  6. Stand on a chair and reach for something 10%  7. Sweep the floor 30%  8. Walk outside the house to a car parked in the driveway 90%  9. Get into or out of a car 100%  10. Walk across a parking lot to the mall 60%  11. Walk up or down a ramp 40%  12. Walk in a crowded mall where people rapidly walk past you 30%  13. Are bumped into by people as you walk through the mall 30%  14. Step onto or off of an escalator while you are holding onto the railing 20%  15. Step onto or off an escalator while holding onto parcels such that you cannot hold onto the railing 10%  16. Walk outside on icy  sidewalks 10%  Total: #/16 650/16 = 40.6%     GENERAL OBSERVATION: Patient ambulates into clinic with SPC, wide base of support, decreased step length, forward trunk and forward head posture and kyphosis.  TREATMENT DATE: 05/17/2024  5 times sit to stand: 39.13 seconds UE support, SBA, no assistance needed  Timed up and go (TUG): 19.62 seconds, SPC, SBA, no assistance needed  Self care: Patient educated on increased risk of falls based on ABC scale and outcome measures including sit to stand, timed up and go and gait speed. Patient educated on how PT can help both assess and treat risk of falls, LE strength and other balance deficits.  PATIENT EDUCATION: Education details: See above Person educated: Patient Education method: Explanation and Verbal cues Education comprehension: verbalized understanding and needs further education  HOME EXERCISE PROGRAM:  GOALS: Goals reviewed with patient? Yes  STG = LTG: Target date: 06/15/2024  Pt will be independent with final HEP for strength, gait, balance in order to build upon functional gains made in therapy. Baseline: TBA Goal status: INITIAL  2.  Patient will improve ABC scale from 40.6% confidence to at least 60% confidence in order to demonstrate decreased fear of falling. Baseline: 40.6%  Goal status: INITIAL  3. Pt will improve 5x sit<>stand to less than or equal to 32 sec to demonstrate improved functional strength and transfer efficiency.  Baseline: 39.13 seconds UE support Goal status: INITIAL  4. Will determine gait speed for LTG Baseline:TBA Goal status: INITIAL  5. Pt will improve TUG time less than 15 seconds in order to demo decrease fall risk. Baseline: 19.62 seconds, SPC Goal status: INITIAL    ASSESSMENT:  CLINICAL IMPRESSION: Patient is an 89 y.o. male who was seen today for physical  therapy evaluation and treatment at [facility] for abnormal gait, fear of falling, increased risk of falls and decrease LE strength. 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 Patient would like to walk around without feeling like they are always going to trip and wants to be able to go up and downstairs safely because they have a lot of steps. They also want their legs to feel stronger because they state feeling weak and unsteady even when standing. Patient stated that cardiac rehab was helpful but didn't help his leg strength or balance very much and would like to improve that. During assessment today it was determined patient is at increase risk of falls due to decreased gait speed, timed up and go and significantly increased time it takes to complete 5 times sit to stand, all markers of high risk of falls. Patient continues to benefit from skilled physical therapy in order to address above mentioned impairments, including decreased risk for falls and improving safe community ambulation.  OBJECTIVE IMPAIRMENTS: Abnormal gait, decreased activity tolerance, decreased balance, decreased cognition, decreased coordination, decreased endurance, decreased mobility, difficulty walking, decreased strength, decreased safety awareness, improper body mechanics, postural dysfunction, and pain.   ACTIVITY LIMITATIONS: carrying, lifting, bending, sitting, standing, squatting, stairs, bathing, toileting, dressing, reach over head, and locomotion level  PARTICIPATION LIMITATIONS: meal prep, cleaning, laundry,  driving, shopping, community activity, and occupation  PERSONAL FACTORS: Age, Fitness, Past/current experiences, Profession, Sex, and 3+ comorbidities: migraines, sleep apnea, stroke, mitral valve clip, osteopenia, RBBB, ascending aorta dilation, post coronary artery stent placement, afib, diabetes, mild fusiform iliac aneurysm, dementia (mentioned in 2020 notes), CAD, GERD, HF with  improved EF, hypertensive heart disease, chronic kidney disease, 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 are also affecting patient's functional outcome.   REHAB POTENTIAL: Good  CLINICAL DECISION MAKING: Evolving/moderate complexity  EVALUATION COMPLEXITY: Moderate   PLAN:  PT FREQUENCY: 2x/week  PT DURATION: 4 weeks  PLANNED INTERVENTIONS: [id number]- PT Re-evaluation, 97110-Therapeutic exercises, 97530- Therapeutic activity, 97112- Neuromuscular re-education, 97535- Self Care, [id number]- Manual therapy, [phone]- Gait training, [phone]- Orthotic Initial, [phone]- Orthotic/Prosthetic subsequent, [phone]- Canalith repositioning, Patient/Family education, Balance training, Stair training, Vestibular training, Visual/preceptual remediation/compensation, Cognitive remediation, and DME instructions  PLAN FOR NEXT SESSION: Assess gait speed, mCTSIB and FGA, determine HEP based on FGA, possibly introduce some HEP   Emmalene Sherry, Student-PT [name], PT, DPT, CBIS, CFVRS 05/17/2024, 6:53 PM

## 2024-05-21 ENCOUNTER — Ambulatory Visit

## 2024-05-21 VITALS — BP 163/85 | HR 82

## 2024-05-21 DIAGNOSIS — Z9181 History of falling: Secondary | ICD-10-CM

## 2024-05-21 DIAGNOSIS — R293 Abnormal posture: Secondary | ICD-10-CM

## 2024-05-21 DIAGNOSIS — R2681 Unsteadiness on feet: Secondary | ICD-10-CM | POA: Diagnosis not present

## 2024-05-21 DIAGNOSIS — M6281 Muscle weakness (generalized): Secondary | ICD-10-CM

## 2024-05-21 NOTE — Progress Notes (Unsigned)
 OUTPATIENT PHYSICAL THERAPY VESTIBULAR TREATMENT     Patient Name: Julion Gatt MRN: [id number] DOB:[birth year], 89 y.o., male Today's Date: 05/21/2024  END OF SESSION:  PT End of Session - 05/21/24 1608     Visit Number 2    Number of Visits 9    Date for Recertification  06/15/24    Authorization Type [employer] MEDICARE    PT Start Time 1402    PT Stop Time 1444    PT Time Calculation (min) 42 min    Equipment Utilized During Treatment Gait belt    Activity Tolerance Patient tolerated treatment well    Behavior During Therapy WFL for tasks assessed/performed           Past Medical History:  Diagnosis Date   (HFimpEF) heart failure with improved EF    Echo 04/2019: EF 35-40 // Echo 5/22: EF 55-60, no RWMA, GR 1 DD, moderate to severe MR, myxomatous mitral valve, similar to prior echocardiogram, mildly elevated PASP, RVSP 35.9, mild AI, mild AV sclerosis without AS, mild dilation of ascending aorta (43 mm)   A-fib (HCC)    RVR-DCCV   Abnormal PSA 01/17/2012   10.38/4/5/ PSA:2013=6.96/ PSA 07/08/2011=6.26/ PSA 2006=8.6   Ascending aorta dilation 09/16/2021   Echocardiogram March 2023: 41 mm   Blood transfusion without reported diagnosis    during vagotomy 1976   CAD (coronary artery disease)    Chronic kidney disease    nephrolithiasis right kidney   Coronary artery disease    ED (erectile dysfunction)    GERD (gastroesophageal reflux disease)    PUD S/P PARTIAL VAGOTOMY   Gynecomastia 04/21/2011   seen by Dr.Murray, no rad tx   H/O impacted cerumen    S/P ENT DR BATES   History of echocardiogram    echo 4/16:  mild LVH, EF 55-60%, Gr 1 DD, no RWMA, mild AI, mild MR, mod LAE, mild TR   Hydroureteronephrosis    right    Hyperlipidemia    Hypertension    Insomnia    Migraines    WITH AURA   Nephrolithiasis    Osteopenia    Overweight    Prostate cancer (HCC) 12/09/03 dx   Prostate ca/adenocarcinoma,gleason=3+3=6    pSA 4.6   RBBB (right bundle branch block)     Rosacea    S/P mitral valve clip implantation 12/22/2023   s/p Mitraclip NTW x 2 with a reduction from 3-4+ MR to 1 + residual MR.   Sinus bradycardia    Sleep apnea    pt unaware, no sleep study   Stroke [facility])    asymptomatic, found on head imaging   Past Surgical History:  Procedure Laterality Date   ABDOMINAL AORTIC ENDOVASCULAR STENT GRAFT Right 10/28/2021   Procedure: ABDOMINAL AORTIC ENDOVASCULAR GRAFT WITH RIGHT EPIGASTRIC ARTERY COILING;  Surgeon: Magda Debby SAILOR, MD;  Location: [facility] OR;  Service: Vascular;  Laterality: Right;   CARDIAC CATHETERIZATION     CARDIOVERSION N/A 06/19/2018   Procedure: CARDIOVERSION;  Surgeon: Rolan Ezra RAMAN, MD;  Location:  [facility] ENDOSCOPY;  Service: Cardiovascular;  Laterality: N/A;   PROSTATE BIOPSY  12/09/2003   adenocarcinoma,glerason: 3+3=6   RIGHT HEART CATH AND CORONARY ANGIOGRAPHY N/A 11/14/2023   Procedure: RIGHT HEART CATH AND CORONARY ANGIOGRAPHY;  Surgeon: Wonda Sharper, MD;  Location: [facility] INVASIVE CV LAB;  Service: Cardiovascular;  Laterality: N/A;   RIGHT/LEFT HEART CATH AND CORONARY ANGIOGRAPHY N/A 04/25/2019   Procedure: RIGHT/LEFT HEART CATH AND CORONARY ANGIOGRAPHY;  Surgeon: Anner Alm ORN, MD;  Location: [facility] INVASIVE CV LAB;  Service: Cardiovascular;  Laterality: N/A;   TEE WITHOUT CARDIOVERSION N/A 06/19/2018   Procedure: TRANSESOPHAGEAL ECHOCARDIOGRAM (TEE);  Surgeon: Rolan Ezra RAMAN, MD;  Location: [facility] ENDOSCOPY;  Service: Cardiovascular;  Laterality: N/A;   TRANSCATHETER MITRAL EDGE TO EDGE REPAIR N/A 12/21/2023   Procedure: TRANSCATHETER MITRAL EDGE TO EDGE REPAIR;  Surgeon: Wonda Sharper, MD;  Location: [facility] INVASIVE CV LAB;  Service: Cardiovascular;  Laterality: N/A;   TRANSESOPHAGEAL ECHOCARDIOGRAM (CATH LAB) N/A 10/14/2023   Procedure: TRANSESOPHAGEAL ECHOCARDIOGRAM;  Surgeon: Francyne Headland, MD;  Location: [facility] INVASIVE CV LAB;  Service: Cardiovascular;  Laterality: N/A;   TRANSESOPHAGEAL ECHOCARDIOGRAM (CATH LAB) N/A  12/21/2023   Procedure: TRANSESOPHAGEAL ECHOCARDIOGRAM;  Surgeon: Wonda Sharper, MD;  Location: [facility] INVASIVE CV LAB;  Service: Cardiovascular;  Laterality: N/A;   VAGOTOMY  07/05/1976   partial   Patient Active Problem List   Diagnosis Date Noted   S/P mitral valve clip implantation 12/22/2023   Overweight    Severe mitral insufficiency 12/21/2023   Abdominal aortic aneurysm 10/28/2021   Heart failure with improved ejection fraction (HFimpEF) (HCC) 09/16/2021   Coronary artery disease involving native coronary artery of native heart without angina pectoris 09/16/2021   Ascending aorta dilation 09/16/2021   Acute on chronic combined systolic and diastolic CHF (congestive heart failure) (HCC) 08/10/2018   Atrial fibrillation (HCC) 06/14/2018   PAH (pulmonary artery hypertension) (HCC) 06/13/2018   Essential hypertension 06/13/2018   HLD (hyperlipidemia) 07/20/2015   Diabetes mellitus without complication (HCC) 07/20/2015   RBBB 10/22/2014   Stage 3a chronic kidney disease (HCC) 10/20/2014   Hydroureteronephrosis    Hx of Prostate cancer    Abnormal PSA 01/17/2012    PCP: Onita Rush, MD REFERRING PROVIDER: Verta Izetta HERO, NP  REFERRING DIAG: R26.89 (ICD-10-CM) - Other abnormalities of gait and mobility   THERAPY DIAG:  Unsteadiness on feet  Muscle weakness (generalized)  Abnormal posture  History of falling  ONSET DATE: 05/09/2024  Rationale for Evaluation and Treatment: Rehabilitation  SUBJECTIVE:   SUBJECTIVE STATEMENT:  Patient ambulates into clinic with SPC. He states he feels fine since last session. Stated he was in bed all day and continues to feel weak. Patient denies falls since last session. 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  PRECAUTIONS: Fall and Other: osteopenia, afib  FALLS: Has patient fallen in last 6 months? Yes. Number of falls 1  LIVING ENVIRONMENT: Lives with: Rents out airbnb with 4 floors and pt lives in basement and rents out rooms in his home Lives in: House/apartment Stairs: Yes: Internal: 64 (4 floors of steps) steps; has railings Has following equipment at home: Single point cane  PLOF: Independent  PATIENT GOALS: Wants to feel confident when he walks and wants his legs to feel stronger. He wants to be stronger going up steps and not feel like he is always going to trip over his feet.  OBJECTIVE:  Note: Objective measures were completed at Evaluation unless otherwise noted.  DIAGNOSTIC FINDINGS: No recent brain or cervical imaging available. Most recent is 10/31/2018: IMPRESSION: 1. No acute abnormality to explain  syncopal episode. 2. Remote lacunar infarcts involving the right centrum semi ovale and the left basal ganglia. 3. Moderate atrophy and white matter disease likely reflects the sequela of chronic microvascular ischemia. 4. Multiple punctate foci of susceptibility consistent with remote foci of punctate hemorrhage suggest an underlying vasculitis. No acute hemorrhage is present.  COGNITION: Overall cognitive status: Within functional limits for tasks assessed  POSTURE:  rounded shoulders, forward head, and flexed trunk   GAIT: Gait pattern: decreased step length- Right, decreased step length- Left, trunk flexed, wide BOS, poor foot clearance- Right, and poor foot clearance- Left Distance walked: clinic distance Assistive device utilized: Single point cane Level of assistance: SBA Comments: Unsteady gait, forward  posture, decreased step length and wide base of support  FUNCTIONAL TESTS:  5 times sit to stand: 39.13 seconds UE support, SBA, no assistance needed Timed up and go (TUG): 19.62 seconds, SPC, SBA, no assistance needed  PATIENT SURVEYS:  ABC scale: The Activities-Specific Balance Confidence (ABC) Scale 0% 10 20 30  40 50 60 70 80 90 100% No confidence<->completely confident  "How confident are you that you will not lose your balance or become unsteady when you . . .   Date tested 05/17/2024  Walk around the house 40%  2. Walk up or down stairs 20%  3. Bend over and pick up a slipper from in front of a closet floor 30%  4. Reach for a small can off a shelf at eye level 80%  5. Stand on tip toes and reach for something above your head 50%  6. Stand on a chair and reach for something 10%  7. Sweep the floor 30%  8. Walk outside the house to a car parked in the driveway 90%  9. Get into or out of a car 100%  10. Walk across a parking lot to the mall 60%  11. Walk up or down a ramp 40%  12. Walk in a crowded mall where people rapidly walk past you 30%  13. Are bumped into by people as you walk through the mall 30%  14. Step onto or off of an escalator while you are holding onto the railing 20%  15. Step onto or off an escalator while holding onto parcels such that you cannot hold onto the railing 10%  16. Walk outside on icy sidewalks 10%  Total: #/16 650/16 = 40.6%     GENERAL OBSERVATION: Patient ambulates into clinic with SPC, wide base of support, decreased step length, forward trunk and forward head posture and kyphosis.                                                                                                                      TREATMENT DATE: 05/21/2024  Therapeutic activity: - Patient educated on how to perform HEP safely at home. Patient provided with HEP. Education on more assessments completed today and their role with determining balance. Education of continued  use - Gait speed: 14.93 seconds, 2.2 ft/second using SPC  and SBA  - Clinic ambulation no AD, SBA and CGA, CGA after balance exercise and more fatigue - FGA: 15/30, performed with SPC, high risk of falls, SBA - Standing calf raises at bar x 10 reps, SBA - Standing single leg stance at bar x accumulate 30 seconds each side, SBA - Sit to stand with minimal arm push off (1 arm push off practiced in clinic) x 5-8 reps  NMR: - mCTSIB: 30 seconds, 20 seconds, 30 seconds, 5 seconds, CGA, min assist needed due to LOB - Rhomberg on foam EC, 2 sets x accumulate 20 seconds, Min assist needed due to loss of balance  *Seated rest breaks used throughout due to fatigue   [facility] PT Assessment - 05/21/24 0001       Functional Gait  Assessment   Gait assessed  Yes    Gait Level Surface Walks 20 ft, slow speed, abnormal gait pattern, evidence for imbalance or deviates 10-15 in outside of the 12 in walkway width. Requires more than 7 sec to ambulate 20 ft.    Change in Gait Speed Able to change speed, demonstrates mild gait deviations, deviates 6-10 in outside of the 12 in walkway width, or no gait deviations, unable to achieve a major change in velocity, or uses a change in velocity, or uses an assistive device.    Gait with Horizontal Head Turns Performs head turns smoothly with slight change in gait velocity (eg, minor disruption to smooth gait path), deviates 6-10 in outside 12 in walkway width, or uses an assistive device.    Gait with Vertical Head Turns Performs task with slight change in gait velocity (eg, minor disruption to smooth gait path), deviates 6 - 10 in outside 12 in walkway width or uses assistive device    Gait and Pivot Turn Pivot turns safely in greater than 3 sec and stops with no loss of balance, or pivot turns safely within 3 sec and stops with mild imbalance, requires small steps to catch balance.    Step Over Obstacle Is able to step over one shoe box (4.5 in total height) but must slow  down and adjust steps to clear box safely. May require verbal cueing.    Gait with Narrow Base of Support Ambulates less than 4 steps heel to toe or cannot perform without assistance.   Needed use of SPC to complete obstacle   Gait with Eyes Closed Walks 20 ft, slow speed, abnormal gait pattern, evidence for imbalance, deviates 10-15 in outside 12 in walkway width. Requires more than 9 sec to ambulate 20 ft.    Ambulating Backwards Walks 20 ft, uses assistive device, slower speed, mild gait deviations, deviates 6-10 in outside 12 in walkway width.    Steps Alternating feet, must use rail.    Total Score 15           PATIENT EDUCATION: Education details: See above Person educated: Patient Education method: Explanation and Verbal cues Education comprehension: verbalized understanding and needs further education  HOME EXERCISE PROGRAM:  HEP access code: [phone]   - Standing calf raises at counter x 10 reps - Standing single leg stance at counter x accumulate 30 seconds each side - Sit to stand with minimal arm push off (1 arm push off practiced in clinic) x 5-8 reps    GOALS: Goals reviewed with patient? Yes  STG = LTG: Target date: 06/15/2024  Pt will be independent with final HEP for strength, gait, balance in order to build upon functional  gains made in therapy. Baseline: TBA Goal status: INITIAL  2.  Patient will improve ABC scale from 40.6% confidence to at least 60% confidence in order to demonstrate decreased fear of falling. Baseline: 40.6%  Goal status: INITIAL  3. Pt will improve 5x sit<>stand to less than or equal to 32 sec to demonstrate improved functional strength and transfer efficiency.  Baseline: 39.13 seconds UE support Goal status: INITIAL  4. Pt will improve gait speed from 2.2 ft/sec with SPC to at least 2.5 ft/sec with LRAD. Baseline: 14.93 seconds, 2.2 ft/second using SPC and SBA Goal status: INITIAL  5. Pt will improve TUG time less than 15  seconds in order to demo decrease fall risk. Baseline: 19.62 seconds, SPC Goal status: INITIAL  6. Pt will improve FGA from 15/30 to at least 20/30 with LRAD in order to demo decrease fall risk. Baseline: 15/30, SPC Goal status: INITIAL   ASSESSMENT:  CLINICAL IMPRESSION: Patient presents to skilled physical therapy today with focus on continuation of balance, gait assessment and providing patient with HEP. Patient tolerated treatment well and found all balance and LE strengthening challenging. Gait speed assessed today demonstrates that patent is at increased risk of falls and FGA assessment displays high risk of falls. Patient demonstrated safe HEP exercise completion within the clinic today. Patient also continues to be challenged by walking backwards, turning, gait with narrow base of support and stepping over an obstacle. Patient has deficits with mCTSIB as it was assessed and conditions with eyes closed and especially with a compliant surface were challenging for the patient. Rest breaks used throughout due to fatigue. Patient continues to deny dizziness and light headedness. Pt should continue with LE strengthening and dynamic balance tasks especially with EC conditions. Patient continues to benefit from skilled physical therapy in order to address above mentioned impairments, including decreased risk for falls and improving safe community ambulation.  OBJECTIVE IMPAIRMENTS: Abnormal gait, decreased activity tolerance, decreased balance, decreased cognition, decreased coordination, decreased endurance, decreased mobility, difficulty walking, decreased strength, decreased safety awareness, improper body mechanics, postural dysfunction, and pain.   ACTIVITY LIMITATIONS: carrying, lifting, bending, sitting, standing, squatting, stairs, bathing, toileting, dressing, reach over head, and locomotion level  PARTICIPATION LIMITATIONS: meal prep, cleaning, laundry, driving, shopping, community  activity, and occupation  PERSONAL FACTORS: Age, Fitness, Past/current experiences, Profession, Sex, and 3+ comorbidities: migraines, sleep apnea, stroke, mitral valve clip, osteopenia, RBBB, ascending aorta dilation, post coronary artery stent placement, afib, diabetes, mild fusiform iliac aneurysm, dementia (mentioned in 2020 notes), CAD, GERD, HF with improved EF, hypertensive heart disease, chronic kidney disease, 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 are also affecting patient's functional outcome.   REHAB POTENTIAL: Good  CLINICAL DECISION MAKING: Evolving/moderate complexity  EVALUATION COMPLEXITY: Moderate   PLAN:  PT FREQUENCY: 2x/week  PT DURATION: 4 weeks  PLANNED INTERVENTIONS: [id number]- PT Re-evaluation, 97110-Therapeutic exercises, 97530- Therapeutic activity, [id number]- Neuromuscular re-education, 97535- Self Care, [id number]- Manual therapy, [id number]- Gait training, [phone]- Orthotic Initial, [phone]- Orthotic/Prosthetic subsequent, [phone]- Canalith repositioning, Patient/Family education, Balance training, Stair training, Vestibular training, Visual/preceptual remediation/compensation, Cognitive remediation, and DME instructions  PLAN FOR NEXT SESSION:  Pt should continue with LE strengthening and dynamic balance tasks especially with EC conditions. Sit to stand with foam, ball slam with foam, banded sit to stand, banded walking with perturbations, foam balance and blazepod hits, taps to steps with ankle weights.  Add to HEP I.e. standing marching and standing hip abduction  Emmalene Sherry, Student-PT 05/21/2024, 4:09 PM

## 2024-05-21 NOTE — Addendum Note (Signed)
 Addended by: BEAUTY NEST A on: 05/21/2024 07:40 AM   Modules accepted: Orders

## 2024-05-23 ENCOUNTER — Ambulatory Visit

## 2024-05-23 VITALS — BP 131/88 | HR 67

## 2024-05-23 DIAGNOSIS — R2681 Unsteadiness on feet: Secondary | ICD-10-CM | POA: Diagnosis not present

## 2024-05-23 DIAGNOSIS — R2689 Other abnormalities of gait and mobility: Secondary | ICD-10-CM

## 2024-05-23 DIAGNOSIS — R293 Abnormal posture: Secondary | ICD-10-CM

## 2024-05-23 DIAGNOSIS — M6281 Muscle weakness (generalized): Secondary | ICD-10-CM

## 2024-05-23 NOTE — Therapy (Unsigned)
 OUTPATIENT PHYSICAL THERAPY VESTIBULAR TREATMENT         Patient Name: [name] MRN: [id number] DOB:09-03-1934, 89 y.o., male Today's Date: 05/21/2024   END OF SESSION:  PT End of Session - 05/23/24 1451     Visit Number 3    Number of Visits 9    Date for Recertification  06/15/24    Authorization Type [employer] MEDICARE    PT Start Time 1447    PT Stop Time 1527    PT Time Calculation (min) 40 min    Equipment Utilized During Treatment Gait belt    Activity Tolerance Patient tolerated treatment well    Behavior During Therapy WFL for tasks assessed/performed                 Past Medical History:  Diagnosis Date   (HFimpEF) heart failure with improved EF      Echo 04/2019: EF 35-40 // Echo 5/22: EF 55-60, no RWMA, GR 1 DD, moderate to severe MR, myxomatous mitral valve, similar to prior echocardiogram, mildly elevated PASP, RVSP 35.9, mild AI, mild AV sclerosis without AS, mild dilation of ascending aorta (43 mm)   A-fib (HCC)      RVR-DCCV   Abnormal PSA 01/17/2012    10.38/4/5/ PSA:2013=6.96/ PSA 07/08/2011=6.26/ PSA 2006=8.6   Ascending aorta dilation 09/16/2021    Echocardiogram March 2023: 41 mm   Blood transfusion without reported diagnosis      during vagotomy 1976   CAD (coronary artery disease)     Chronic kidney disease      nephrolithiasis right kidney   Coronary artery disease     ED (erectile dysfunction)     GERD (gastroesophageal reflux disease)      PUD S/P PARTIAL VAGOTOMY   Gynecomastia 04/21/2011    seen by Dr.Murray, no rad tx   H/O impacted cerumen      S/P ENT DR BATES   History of echocardiogram      echo 4/16:  mild LVH, EF 55-60%, Gr 1 DD, no RWMA, mild AI, mild MR, mod LAE, mild TR   Hydroureteronephrosis      right    Hyperlipidemia     Hypertension     Insomnia     Migraines      WITH AURA   Nephrolithiasis     Osteopenia     Overweight     Prostate cancer (HCC) 12/09/03 dx    Prostate ca/adenocarcinoma,gleason=3+3=6    pSA 4.6    RBBB (right bundle branch block)     Rosacea     S/P mitral valve clip implantation 12/22/2023    s/p Mitraclip NTW x 2 with a reduction from 3-4+ MR to 1 + residual MR.   Sinus bradycardia     Sleep apnea      pt unaware, no sleep study   Stroke [facility])      asymptomatic, found on head imaging             Past Surgical History:  Procedure Laterality Date   ABDOMINAL AORTIC ENDOVASCULAR STENT GRAFT Right 10/28/2021    Procedure: ABDOMINAL AORTIC ENDOVASCULAR GRAFT WITH RIGHT EPIGASTRIC ARTERY COILING;  Surgeon: Magda Debby SAILOR, MD;  Location: [facility] OR;  Service: Vascular;  Laterality: Right;   CARDIAC CATHETERIZATION       CARDIOVERSION N/A 06/19/2018    Procedure: CARDIOVERSION;  Surgeon: Rolan Ezra RAMAN, MD;  Location: [facility] ENDOSCOPY;  Service: Cardiovascular;  Laterality: N/A;   PROSTATE BIOPSY   12/09/2003  adenocarcinoma,glerason: 3+3=6   RIGHT HEART CATH AND CORONARY ANGIOGRAPHY N/A 11/14/2023    Procedure: RIGHT HEART CATH AND CORONARY ANGIOGRAPHY;  Surgeon: Wonda Sharper, MD;  Location: [facility] INVASIVE CV LAB;  Service: Cardiovascular;  Laterality: N/A;   RIGHT/LEFT HEART CATH AND CORONARY ANGIOGRAPHY N/A 04/25/2019    Procedure: RIGHT/LEFT HEART CATH AND CORONARY ANGIOGRAPHY;  Surgeon: Anner Alm ORN, MD;  Location: [facility] INVASIVE CV LAB;  Service: Cardiovascular;  Laterality: N/A;   TEE WITHOUT CARDIOVERSION N/A 06/19/2018    Procedure: TRANSESOPHAGEAL ECHOCARDIOGRAM (TEE);  Surgeon: Rolan Ezra RAMAN, MD;  Location: [facility] ENDOSCOPY;  Service: Cardiovascular;  Laterality: N/A;   TRANSCATHETER MITRAL EDGE TO EDGE REPAIR N/A 12/21/2023    Procedure: TRANSCATHETER MITRAL EDGE TO EDGE REPAIR;  Surgeon: Wonda Sharper, MD;  Location: [facility] INVASIVE CV LAB;  Service: Cardiovascular;  Laterality: N/A;   TRANSESOPHAGEAL ECHOCARDIOGRAM (CATH LAB) N/A 10/14/2023    Procedure: TRANSESOPHAGEAL ECHOCARDIOGRAM;  Surgeon: Francyne Headland, MD;  Location: [facility] INVASIVE CV LAB;  Service: Cardiovascular;   Laterality: N/A;   TRANSESOPHAGEAL ECHOCARDIOGRAM (CATH LAB) N/A 12/21/2023    Procedure: TRANSESOPHAGEAL ECHOCARDIOGRAM;  Surgeon: Wonda Sharper, MD;  Location: [facility] INVASIVE CV LAB;  Service: Cardiovascular;  Laterality: N/A;   VAGOTOMY   07/05/1976    partial            Patient Active Problem List    Diagnosis Date Noted   S/P mitral valve clip implantation 12/22/2023   Overweight     Severe mitral insufficiency 12/21/2023   Abdominal aortic aneurysm 10/28/2021   Heart failure with improved ejection fraction (HFimpEF) (HCC) 09/16/2021   Coronary artery disease involving native coronary artery of native heart without angina pectoris 09/16/2021   Ascending aorta dilation 09/16/2021   Acute on chronic combined systolic and diastolic CHF (congestive heart failure) (HCC) 08/10/2018   Atrial fibrillation (HCC) 06/14/2018   PAH (pulmonary artery hypertension) (HCC) 06/13/2018   Essential hypertension 06/13/2018   HLD (hyperlipidemia) 07/20/2015   Diabetes mellitus without complication (HCC) 07/20/2015   RBBB 10/22/2014   Stage 3a chronic kidney disease (HCC) 10/20/2014   Hydroureteronephrosis     Hx of Prostate cancer     Abnormal PSA 01/17/2012      PCP: Onita Rush, MD REFERRING PROVIDER: Verta Izetta HERO, NP   REFERRING DIAG: R26.89 (ICD-10-CM) - Other abnormalities of gait and mobility    THERAPY DIAG:  Unsteadiness on feet   Muscle weakness (generalized)   Abnormal posture   History of falling   ONSET DATE: 05/09/2024   Rationale for Evaluation and Treatment: Rehabilitation   SUBJECTIVE:    SUBJECTIVE STATEMENT: Patient arrives to clinic alone, using SPC. Denies falls. Does endorse low energy.  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   PRECAUTIONS: Fall and Other: osteopenia, afib   PATIENT GOALS: Wants to feel confident when he walks and wants his legs to feel stronger. He wants to be stronger going up steps and not feel like he is always going to trip over his feet.   OBJECTIVE:  Note: Objective measures were completed at Evaluation unless otherwise noted.  DIAGNOSTIC FINDINGS: No recent brain or cervical imaging available. Most recent is 10/31/2018: IMPRESSION: 1. No acute abnormality to explain syncopal episode. 2. Remote lacunar infarcts involving the right centrum semi ovale and the left basal ganglia. 3. Moderate atrophy and white matter disease likely reflects the sequela of chronic microvascular ischemia. 4. Multiple punctate foci of susceptibility consistent with remote foci of punctate hemorrhage suggest an underlying vasculitis. No acute hemorrhage is present.                                                                                                                      TREATMENT NMR: -scifit hills level 5 x10 mins B UE/LE for neural priming  -sit <> stand on airex   -attempted progression to holding med ball, however unable due to reliance on BUE for sit <> stand  -standing on airex chops with 1kg ball  -ant/post step on/off airex with U UE support  -lateral step off Airex L LE  -lateral stepping on foam balance beam, B UE support progressing to U UE support    PATIENT EDUCATION: Education details: continue HEP  Person educated: Patient Education method: Explanation and Verbal cues Education comprehension: verbalized understanding and needs further education   HOME EXERCISE PROGRAM:   HEP access code: [phone]    - Standing calf  raises at counter x 10 reps - Standing single leg stance at counter x accumulate 30 seconds each side - Sit to stand with minimal arm push off (1 arm push off practiced in clinic) x 5-8 reps       GOALS: Goals reviewed with patient? Yes   STG = LTG: Target date: 06/15/2024   Pt will be independent with final HEP for strength, gait, balance in order to build upon functional gains made in therapy. Baseline: TBA Goal status: INITIAL   2.  Patient will improve ABC scale from 40.6% confidence to at least 60% confidence in order to demonstrate decreased fear of falling. Baseline: 40.6%  Goal status: INITIAL   3. Pt will improve 5x sit<>stand to less than or equal to 32 sec to demonstrate improved functional strength and transfer efficiency.  Baseline: 39.13 seconds UE support Goal status: INITIAL   4. Pt will improve gait speed from 2.2 ft/sec with SPC to at least 2.5 ft/sec with LRAD. Baseline: 14.93 seconds, 2.2 ft/second using SPC and SBA Goal status: INITIAL   5. Pt will improve TUG time less than 15 seconds in order to demo decrease fall risk. Baseline: 19.62 seconds, SPC Goal status: INITIAL   6. Pt will improve FGA from 15/30 to at least 20/30 with LRAD in order to demo decrease fall risk. Baseline: 15/30, SPC Goal status: INITIAL     ASSESSMENT:   CLINICAL IMPRESSION: Patient seen for skilled PT session with emphasis on general activity tolerance and functional strengthening. Somewhat limited in tolerance to prolonged dynamic activities due to fatigue, but did improve as session progressed. He demonstrates poor proximal LE strength with relative inability to  stand without substantial use of B UE. Continue POC.   OBJECTIVE IMPAIRMENTS: Abnormal gait, decreased activity tolerance, decreased balance, decreased cognition, decreased coordination, decreased endurance, decreased mobility, difficulty walking, decreased strength, decreased safety awareness, improper body mechanics,  postural dysfunction, and pain.    ACTIVITY LIMITATIONS: carrying, lifting, bending, sitting, standing, squatting, stairs, bathing, toileting, dressing, reach over head, and locomotion level   PARTICIPATION LIMITATIONS: meal prep, cleaning, laundry, driving, shopping, community activity, and occupation   PERSONAL FACTORS: Age, Fitness, Past/current experiences, Profession, Sex, and 3+ comorbidities: migraines, sleep apnea, stroke, mitral valve clip, osteopenia, RBBB, ascending aorta dilation, post coronary artery stent placement, afib, diabetes, mild fusiform iliac aneurysm, dementia (mentioned in 2020 notes), CAD, GERD, HF with improved EF, hypertensive heart disease, chronic kidney disease, 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 are also affecting patient's functional outcome.    REHAB POTENTIAL: Good   CLINICAL DECISION MAKING: Evolving/moderate complexity   EVALUATION COMPLEXITY: Moderate     PLAN:   PT FREQUENCY: 2x/week   PT DURATION: 4 weeks   PLANNED INTERVENTIONS: [id number]- PT Re-evaluation, 97110-Therapeutic exercises, 97530- Therapeutic activity, [id number]- Neuromuscular re-education, 97535- Self Care, [id number]- Manual therapy, [id number]- Gait training, [phone]- Orthotic Initial, [phone]- Orthotic/Prosthetic subsequent, [phone]- Canalith repositioning, Patient/Family education, Balance training, Stair training, Vestibular training, Visual/preceptual remediation/compensation, Cognitive remediation, and DME instructions   PLAN FOR NEXT SESSION:  Pt should continue with LE strengthening and dynamic balance tasks especially with EC conditions; any proximal strengthening  Sit to stand with foam, ball slam with foam, banded sit to stand, banded walking with perturbations, foam balance and blazepod hits, taps to steps with ankle weights.   Add to HEP I.e. standing marching and standing hip abduction      Delon DELENA Pop, PT, DPT, CBIS, CFVRS 05/24/24 [phone]

## 2024-05-30 ENCOUNTER — Ambulatory Visit: Admitting: Physical Therapy

## 2024-05-30 VITALS — BP 138/93 | HR 78

## 2024-05-30 DIAGNOSIS — R2681 Unsteadiness on feet: Secondary | ICD-10-CM | POA: Diagnosis not present

## 2024-05-30 DIAGNOSIS — R293 Abnormal posture: Secondary | ICD-10-CM

## 2024-05-30 DIAGNOSIS — R2689 Other abnormalities of gait and mobility: Secondary | ICD-10-CM

## 2024-05-30 NOTE — Therapy (Signed)
 OUTPATIENT PHYSICAL THERAPY VESTIBULAR TREATMENT         Patient Name: Dominyck Reser MRN: [id number] DOB:1935-04-27, 89 y.o., male Today's Date: 05/21/2024   END OF SESSION:  PT End of Session - 05/30/24 1401     Visit Number 4    Number of Visits 9    Date for Recertification  06/15/24    Authorization Type [employer] MEDICARE    PT Start Time 1318    PT Stop Time 1400    PT Time Calculation (min) 42 min    Equipment Utilized During Treatment Gait belt    Activity Tolerance Patient tolerated treatment well    Behavior During Therapy WFL for tasks assessed/performed                  Past Medical History:  Diagnosis Date   (HFimpEF) heart failure with improved EF      Echo 04/2019: EF 35-40 // Echo 5/22: EF 55-60, no RWMA, GR 1 DD, moderate to severe MR, myxomatous mitral valve, similar to prior echocardiogram, mildly elevated PASP, RVSP 35.9, mild AI, mild AV sclerosis without AS, mild dilation of ascending aorta (43 mm)   A-fib (HCC)      RVR-DCCV   Abnormal PSA 01/17/2012    10.38/4/5/ PSA:2013=6.96/ PSA 07/08/2011=6.26/ PSA 2006=8.6   Ascending aorta dilation 09/16/2021    Echocardiogram March 2023: 41 mm   Blood transfusion without reported diagnosis      during vagotomy 1976   CAD (coronary artery disease)     Chronic kidney disease      nephrolithiasis right kidney   Coronary artery disease     ED (erectile dysfunction)     GERD (gastroesophageal reflux disease)      PUD S/P PARTIAL VAGOTOMY   Gynecomastia 04/21/2011    seen by Dr.Murray, no rad tx   H/O impacted cerumen      S/P ENT DR BATES   History of echocardiogram      echo 4/16:  mild LVH, EF 55-60%, Gr 1 DD, no RWMA, mild AI, mild MR, mod LAE, mild TR   Hydroureteronephrosis      right    Hyperlipidemia     Hypertension     Insomnia     Migraines      WITH AURA   Nephrolithiasis     Osteopenia     Overweight     Prostate cancer (HCC) 12/09/03 dx    Prostate ca/adenocarcinoma,gleason=3+3=6    pSA  4.6   RBBB (right bundle branch block)     Rosacea     S/P mitral valve clip implantation 12/22/2023    s/p Mitraclip NTW x 2 with a reduction from 3-4+ MR to 1 + residual MR.   Sinus bradycardia     Sleep apnea      pt unaware, no sleep study   Stroke [facility])      asymptomatic, found on head imaging             Past Surgical History:  Procedure Laterality Date   ABDOMINAL AORTIC ENDOVASCULAR STENT GRAFT Right 10/28/2021    Procedure: ABDOMINAL AORTIC ENDOVASCULAR GRAFT WITH RIGHT EPIGASTRIC ARTERY COILING;  Surgeon: Magda Debby SAILOR, MD;  Location: [facility] OR;  Service: Vascular;  Laterality: Right;   CARDIAC CATHETERIZATION       CARDIOVERSION N/A 06/19/2018    Procedure: CARDIOVERSION;  Surgeon: Rolan Ezra RAMAN, MD;  Location: [facility] ENDOSCOPY;  Service: Cardiovascular;  Laterality: N/A;   PROSTATE BIOPSY   12/09/2003  adenocarcinoma,glerason: 3+3=6   RIGHT HEART CATH AND CORONARY ANGIOGRAPHY N/A 11/14/2023    Procedure: RIGHT HEART CATH AND CORONARY ANGIOGRAPHY;  Surgeon: Wonda Sharper, MD;  Location: [facility] INVASIVE CV LAB;  Service: Cardiovascular;  Laterality: N/A;   RIGHT/LEFT HEART CATH AND CORONARY ANGIOGRAPHY N/A 04/25/2019    Procedure: RIGHT/LEFT HEART CATH AND CORONARY ANGIOGRAPHY;  Surgeon: Anner Alm ORN, MD;  Location: [facility] INVASIVE CV LAB;  Service: Cardiovascular;  Laterality: N/A;   TEE WITHOUT CARDIOVERSION N/A 06/19/2018    Procedure: TRANSESOPHAGEAL ECHOCARDIOGRAM (TEE);  Surgeon: Rolan Ezra RAMAN, MD;  Location: [facility] ENDOSCOPY;  Service: Cardiovascular;  Laterality: N/A;   TRANSCATHETER MITRAL EDGE TO EDGE REPAIR N/A 12/21/2023    Procedure: TRANSCATHETER MITRAL EDGE TO EDGE REPAIR;  Surgeon: Wonda Sharper, MD;  Location: [facility] INVASIVE CV LAB;  Service: Cardiovascular;  Laterality: N/A;   TRANSESOPHAGEAL ECHOCARDIOGRAM (CATH LAB) N/A 10/14/2023    Procedure: TRANSESOPHAGEAL ECHOCARDIOGRAM;  Surgeon: Francyne Headland, MD;  Location: [facility] INVASIVE CV LAB;  Service:  Cardiovascular;  Laterality: N/A;   TRANSESOPHAGEAL ECHOCARDIOGRAM (CATH LAB) N/A 12/21/2023    Procedure: TRANSESOPHAGEAL ECHOCARDIOGRAM;  Surgeon: Wonda Sharper, MD;  Location: [facility] INVASIVE CV LAB;  Service: Cardiovascular;  Laterality: N/A;   VAGOTOMY   07/05/1976    partial            Patient Active Problem List    Diagnosis Date Noted   S/P mitral valve clip implantation 12/22/2023   Overweight     Severe mitral insufficiency 12/21/2023   Abdominal aortic aneurysm 10/28/2021   Heart failure with improved ejection fraction (HFimpEF) (HCC) 09/16/2021   Coronary artery disease involving native coronary artery of native heart without angina pectoris 09/16/2021   Ascending aorta dilation 09/16/2021   Acute on chronic combined systolic and diastolic CHF (congestive heart failure) (HCC) 08/10/2018   Atrial fibrillation (HCC) 06/14/2018   PAH (pulmonary artery hypertension) (HCC) 06/13/2018   Essential hypertension 06/13/2018   HLD (hyperlipidemia) 07/20/2015   Diabetes mellitus without complication (HCC) 07/20/2015   RBBB 10/22/2014   Stage 3a chronic kidney disease (HCC) 10/20/2014   Hydroureteronephrosis     Hx of Prostate cancer     Abnormal PSA 01/17/2012      PCP: Onita Rush, MD REFERRING PROVIDER: Verta Izetta HERO, NP   REFERRING DIAG: R26.89 (ICD-10-CM) - Other abnormalities of gait and mobility    THERAPY DIAG:  Unsteadiness on feet   Muscle weakness (generalized)   Abnormal posture   History of falling   ONSET DATE: 05/09/2024   Rationale for Evaluation and Treatment: Rehabilitation   SUBJECTIVE:    SUBJECTIVE STATEMENT: Patient ambulates into clinic using SPC. He states feeling good overall. He will be going to [place] for thanksgiving with family. He states it's hard right now because he is trying to decide to move to [facility] or not with his daughter but he knows Elroy from living here for a while and doesn't like the town his daughter is living  in. He states he is working a lot lately with the [employer]. Patient denies falls.  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   PRECAUTIONS: Fall and Other: osteopenia, afib   PATIENT GOALS: Wants to feel confident when he walks and wants his legs to feel stronger. He wants to be stronger going up steps and not feel like he is always going to trip over his feet.   OBJECTIVE:  Note: Objective measures were completed at Evaluation unless otherwise noted.   DIAGNOSTIC FINDINGS: No recent brain or cervical imaging available. Most recent is 10/31/2018: IMPRESSION: 1. No acute abnormality to explain syncopal episode. 2. Remote lacunar infarcts involving the right centrum semi ovale and the left basal ganglia. 3. Moderate atrophy and white matter disease likely reflects the sequela of chronic microvascular ischemia. 4. Multiple punctate foci of susceptibility consistent with remote foci of punctate hemorrhage suggest an underlying vasculitis. No acute hemorrhage is present.                                                                                                                      TREATMENT  Vitals:   05/30/24 1326  BP: (!) 138/93  Pulse: 78    Self Care: Education on taking meds consistently due to having history of heart complications. He verbally agrees and states he gets his pills ready each week and takes them accordingly every day. Vitals were within normal range today for physical therapy and  patient denies any fatigue, denies shortness of breath and denies dizziness.  NMR: Sit to stand on foam w/6# ballslam, elevated mat table 2 sets x 10 reps, no UE push off, mat table elevated, challenging due to needing to reach far and use hip strategy to recovery balance, CGA, min assist needed, improved catching with bounce with target on floor with repetitions Sit to stand with eyes closed on elevated mat table, 2 sets x 5 reps, UE push off, mat table elevated, challenging, CGA, min assist due to some LOB and cueing needed for hip extension with standing tall  Therapeutic Activity: Sit to stand on mat table elevated x 5 reps, CGA, no assist needed, UE push off, cueing needed for hip extension Toe taps to 6 or 12 inch step (at stairs) standing on foam, no UE support, CGA, min assist needed due to some loss of balance which improved with repetition Set 1 x 12 total reps, no foam, pt tapping on color circle 2 colors on 6 inch step and 2 colors on 12 inch step Sets 2-3, on foam, pt tapping on color circle 2 colors on 6 inch step and 2 colors on 12 inch step, 16 total steps each set,  therapist calling out random color for tapping, challenging as patient is not used to not holding on to rails, found foam difficult and was surprised with his ability, completed all tasks successfully *Clinic distances ambulated without AD, CGA and SBA based on level of fatigue. Seated rest breaks throughout due to fatigue, increased time to fatigue this session   PATIENT EDUCATION: Education details: See above Person educated: Patient Education method: Explanation and Verbal cues  Education comprehension: verbalized understanding and needs further education   HOME EXERCISE PROGRAM:   HEP access code: [phone]    - Standing calf raises at counter x 10 reps - Standing single leg stance at counter x accumulate 30 seconds each side - Sit to stand with minimal arm push off (1 arm push off practiced in clinic) x 5-8  reps       GOALS: Goals reviewed with patient? Yes   STG = LTG: Target date: 06/15/2024   Pt will be independent with final HEP for strength, gait, balance in order to build upon functional gains made in therapy. Baseline: TBA Goal status: INITIAL   2.  Patient will improve ABC scale from 40.6% confidence to at least 60% confidence in order to demonstrate decreased fear of falling. Baseline: 40.6%  Goal status: INITIAL   3. Pt will improve 5x sit<>stand to less than or equal to 32 sec to demonstrate improved functional strength and transfer efficiency.  Baseline: 39.13 seconds UE support Goal status: INITIAL   4. Pt will improve gait speed from 2.2 ft/sec with SPC to at least 2.5 ft/sec with LRAD. Baseline: 14.93 seconds, 2.2 ft/second using SPC and SBA Goal status: INITIAL   5. Pt will improve TUG time less than 15 seconds in order to demo decrease fall risk. Baseline: 19.62 seconds, SPC Goal status: INITIAL   6. Pt will improve FGA from 15/30 to at least 20/30 with LRAD in order to demo decrease fall risk. Baseline: 15/30, SPC Goal status: INITIAL     ASSESSMENT:   CLINICAL IMPRESSION: Patient seen for skilled PT session with emphasis on LE strengthening and dynamic balance for improved functional ambulation and decreasing risk of falls. Noted increase in activity tolerance today with increased time to fatigue and less rest breaks needed throughout. Patient ambulates in clinic without AD and SBA and CGA depending on level of fatigue or exercise performed. Patient continues to be challenged by LE strengthening with complaint surface and single leg stance tasks for improving dynamic balance such as toe taps to steps. All intervention was well tolerated with seated rest breaks throughout as needed. Patient continues to benefit from skilled PT to address impairments as noted and progress towards long term goals. Will continue per POC.  OBJECTIVE IMPAIRMENTS: Abnormal gait,  decreased activity tolerance, decreased balance, decreased cognition, decreased coordination, decreased endurance, decreased mobility, difficulty walking, decreased strength, decreased safety awareness, improper body mechanics, postural dysfunction, and pain.    ACTIVITY LIMITATIONS: carrying, lifting, bending, sitting, standing, squatting, stairs, bathing, toileting, dressing, reach over head, and locomotion level   PARTICIPATION LIMITATIONS: meal prep, cleaning, laundry, driving, shopping, community activity, and occupation   PERSONAL FACTORS: Age, Fitness, Past/current experiences, Profession, Sex, and 3+ comorbidities: migraines, sleep apnea, stroke, mitral valve clip, osteopenia, RBBB, ascending aorta dilation, post coronary artery stent placement, afib, diabetes, mild fusiform iliac aneurysm, dementia (mentioned in 2020 notes), CAD, GERD, HF with improved EF, hypertensive heart disease, chronic kidney disease, 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 are also affecting patient's functional outcome.    REHAB POTENTIAL: Good   CLINICAL DECISION MAKING: Evolving/moderate complexity   EVALUATION COMPLEXITY: Moderate     PLAN:   PT FREQUENCY: 2x/week   PT DURATION: 4 weeks   PLANNED INTERVENTIONS: [id number]- PT Re-evaluation, 97110-Therapeutic exercises, 97530- Therapeutic activity, [id number]- Neuromuscular re-education, 97535- Self Care, [id number]- Manual therapy, [id number]- Gait training, [id number]- Orthotic Initial, [id number]- Orthotic/Prosthetic subsequent, [phone]- Canalith repositioning,  Patient/Family education, Location manager, Stair training, Vestibular training, Visual/preceptual remediation/compensation, Cognitive remediation, and DME instructions   PLAN FOR NEXT SESSION:  Pt should continue with LE strengthening and dynamic balance tasks especially with EC conditions; any proximal strengthening  Sit to stand  with foam, ball slam with foam, banded sit to stand, banded walking with perturbations, foam balance and blazepod hits, taps to steps with ankle weights.   Add to HEP I.e. standing marching and standing hip abduction  Emmalene Sherry, SPT

## 2024-06-05 ENCOUNTER — Other Ambulatory Visit: Payer: Self-pay | Admitting: Cardiovascular Disease

## 2024-06-06 ENCOUNTER — Ambulatory Visit: Admitting: Physical Therapy

## 2024-06-06 VITALS — BP 129/71 | HR 79

## 2024-06-06 DIAGNOSIS — R293 Abnormal posture: Secondary | ICD-10-CM | POA: Insufficient documentation

## 2024-06-06 DIAGNOSIS — R2681 Unsteadiness on feet: Secondary | ICD-10-CM | POA: Insufficient documentation

## 2024-06-06 DIAGNOSIS — M6281 Muscle weakness (generalized): Secondary | ICD-10-CM | POA: Insufficient documentation

## 2024-06-06 DIAGNOSIS — R2689 Other abnormalities of gait and mobility: Secondary | ICD-10-CM | POA: Insufficient documentation

## 2024-06-06 DIAGNOSIS — Z9181 History of falling: Secondary | ICD-10-CM | POA: Insufficient documentation

## 2024-06-06 NOTE — Therapy (Signed)
 OUTPATIENT PHYSICAL THERAPY VESTIBULAR TREATMENT         Patient Name: [name] MRN: [id number] DOB:1935-02-27, 89 y.o., male Today's Date: 05/21/2024   END OF SESSION:  PT End of Session - 06/06/24 1408     Visit Number 5    Number of Visits 9    Date for Recertification  06/15/24    Authorization Type [employer] MEDICARE    PT Start Time 1406    PT Stop Time 1445    PT Time Calculation (min) 39 min    Equipment Utilized During Treatment Gait belt    Activity Tolerance Patient tolerated treatment well    Behavior During Therapy WFL for tasks assessed/performed                   Past Medical History:  Diagnosis Date   (HFimpEF) heart failure with improved EF      Echo 04/2019: EF 35-40 // Echo 5/22: EF 55-60, no RWMA, GR 1 DD, moderate to severe MR, myxomatous mitral valve, similar to prior echocardiogram, mildly elevated PASP, RVSP 35.9, mild AI, mild AV sclerosis without AS, mild dilation of ascending aorta (43 mm)   A-fib (HCC)      RVR-DCCV   Abnormal PSA 01/17/2012    10.38/4/5/ PSA:2013=6.96/ PSA 07/08/2011=6.26/ PSA 2006=8.6   Ascending aorta dilation 09/16/2021    Echocardiogram March 2023: 41 mm   Blood transfusion without reported diagnosis      during vagotomy 1976   CAD (coronary artery disease)     Chronic kidney disease      nephrolithiasis right kidney   Coronary artery disease     ED (erectile dysfunction)     GERD (gastroesophageal reflux disease)      PUD S/P PARTIAL VAGOTOMY   Gynecomastia 04/21/2011    seen by Dr.Murray, no rad tx   H/O impacted cerumen      S/P ENT DR BATES   History of echocardiogram      echo 4/16:  mild LVH, EF 55-60%, Gr 1 DD, no RWMA, mild AI, mild MR, mod LAE, mild TR   Hydroureteronephrosis      right    Hyperlipidemia     Hypertension     Insomnia     Migraines      WITH AURA   Nephrolithiasis     Osteopenia     Overweight     Prostate cancer (HCC) 12/09/03 dx    Prostate ca/adenocarcinoma,gleason=3+3=6    pSA  4.6   RBBB (right bundle branch block)     Rosacea     S/P mitral valve clip implantation 12/22/2023    s/p Mitraclip NTW x 2 with a reduction from 3-4+ MR to 1 + residual MR.   Sinus bradycardia     Sleep apnea      pt unaware, no sleep study   Stroke [facility])      asymptomatic, found on head imaging             Past Surgical History:  Procedure Laterality Date   ABDOMINAL AORTIC ENDOVASCULAR STENT GRAFT Right 10/28/2021    Procedure: ABDOMINAL AORTIC ENDOVASCULAR GRAFT WITH RIGHT EPIGASTRIC ARTERY COILING;  Surgeon: Magda Debby SAILOR, MD;  Location: [facility] OR;  Service: Vascular;  Laterality: Right;   CARDIAC CATHETERIZATION       CARDIOVERSION N/A 06/19/2018    Procedure: CARDIOVERSION;  Surgeon: Rolan Ezra RAMAN, MD;  Location: [facility] ENDOSCOPY;  Service: Cardiovascular;  Laterality: N/A;   PROSTATE BIOPSY  12/09/2003    adenocarcinoma,glerason: 3+3=6   RIGHT HEART CATH AND CORONARY ANGIOGRAPHY N/A 11/14/2023    Procedure: RIGHT HEART CATH AND CORONARY ANGIOGRAPHY;  Surgeon: Wonda Sharper, MD;  Location: [facility] INVASIVE CV LAB;  Service: Cardiovascular;  Laterality: N/A;   RIGHT/LEFT HEART CATH AND CORONARY ANGIOGRAPHY N/A 04/25/2019    Procedure: RIGHT/LEFT HEART CATH AND CORONARY ANGIOGRAPHY;  Surgeon: Anner Alm ORN, MD;  Location: [facility] INVASIVE CV LAB;  Service: Cardiovascular;  Laterality: N/A;   TEE WITHOUT CARDIOVERSION N/A 06/19/2018    Procedure: TRANSESOPHAGEAL ECHOCARDIOGRAM (TEE);  Surgeon: Rolan Ezra RAMAN, MD;  Location: [facility] ENDOSCOPY;  Service: Cardiovascular;  Laterality: N/A;   TRANSCATHETER MITRAL EDGE TO EDGE REPAIR N/A 12/21/2023    Procedure: TRANSCATHETER MITRAL EDGE TO EDGE REPAIR;  Surgeon: Wonda Sharper, MD;  Location: [facility] INVASIVE CV LAB;  Service: Cardiovascular;  Laterality: N/A;   TRANSESOPHAGEAL ECHOCARDIOGRAM (CATH LAB) N/A 10/14/2023    Procedure: TRANSESOPHAGEAL ECHOCARDIOGRAM;  Surgeon: Francyne Headland, MD;  Location: [facility] INVASIVE CV LAB;  Service:  Cardiovascular;  Laterality: N/A;   TRANSESOPHAGEAL ECHOCARDIOGRAM (CATH LAB) N/A 12/21/2023    Procedure: TRANSESOPHAGEAL ECHOCARDIOGRAM;  Surgeon: Wonda Sharper, MD;  Location: [facility] INVASIVE CV LAB;  Service: Cardiovascular;  Laterality: N/A;   VAGOTOMY   07/05/1976    partial            Patient Active Problem List    Diagnosis Date Noted   S/P mitral valve clip implantation 12/22/2023   Overweight     Severe mitral insufficiency 12/21/2023   Abdominal aortic aneurysm 10/28/2021   Heart failure with improved ejection fraction (HFimpEF) (HCC) 09/16/2021   Coronary artery disease involving native coronary artery of native heart without angina pectoris 09/16/2021   Ascending aorta dilation 09/16/2021   Acute on chronic combined systolic and diastolic CHF (congestive heart failure) (HCC) 08/10/2018   Atrial fibrillation (HCC) 06/14/2018   PAH (pulmonary artery hypertension) (HCC) 06/13/2018   Essential hypertension 06/13/2018   HLD (hyperlipidemia) 07/20/2015   Diabetes mellitus without complication (HCC) 07/20/2015   RBBB 10/22/2014   Stage 3a chronic kidney disease (HCC) 10/20/2014   Hydroureteronephrosis     Hx of Prostate cancer     Abnormal PSA 01/17/2012      PCP: Onita Rush, MD REFERRING PROVIDER: Verta Izetta HERO, NP   REFERRING DIAG: R26.89 (ICD-10-CM) - Other abnormalities of gait and mobility    THERAPY DIAG:  Unsteadiness on feet   Muscle weakness (generalized)   Abnormal posture   History of falling   ONSET DATE: 05/09/2024   Rationale for Evaluation and Treatment: Rehabilitation   SUBJECTIVE:    SUBJECTIVE STATEMENT:  Patient ambulates into clinic using SPC. He states feeling more tired these days but that this has been going on for the last year after the heart stuff started. He only had eggs today and says that time is his biggest barrier to not eating enough throughout the day because he stays in bed until late and doesn't have time to eat. He said  he will have a proper meal for dinner. He went to [facility] for thanksgiving and stated he didn't have fun because the found out the house he will be moving to with his daughter doesn't have railings and that his room is in the basement which doesn't have railings either. He said that his daughter is going to add railings to the home. Patient denies falls.   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   PRECAUTIONS: Fall and Other: osteopenia, afib   PATIENT GOALS: Wants to feel confident when he walks and wants his legs to feel stronger. He wants to be stronger going up steps and not feel like he is always going to trip over his feet.   OBJECTIVE:  Note: Objective measures were completed at Evaluation unless otherwise noted.   DIAGNOSTIC FINDINGS: No recent brain or cervical imaging available. Most recent is 10/31/2018: IMPRESSION: 1. No acute abnormality to explain syncopal episode. 2. Remote lacunar infarcts involving the right centrum semi ovale and the left basal ganglia. 3. Moderate atrophy and white matter disease likely reflects the sequela of chronic microvascular ischemia. 4. Multiple punctate foci of susceptibility consistent with remote foci of punctate hemorrhage suggest an underlying vasculitis. No acute hemorrhage is present.                                                                                                                      TREATMENT  Vitals:   06/06/24 1410  BP: 129/71  Pulse: 79      Self Care: Education on eating throughout the day and including a variety of foods, I.e. not only protein because other foods like fruits and pastas are needed for energy and a well rounded diet. Vitals were within normal range today for physical therapy. He states that getting up late and not having enough time to eat is the biggest barrier to eating enough in a day. Patient advised to keep us  updated on his fatigue and he v/a to do so.  NMR: In parallel bars: Foam balance beam side stepping, down and back x 4 rounds, CGA, min assisted needed for some LOB Foam balance beam side stepping with ball throw, down and back x 4 rounds, challenging, use of UE to catch himself, need constant cueing to not use UE, cueing for big steps, CGA, min assisted needed for some LOB Foam balance beam tandem walking, down and back x 3 rounds, CGA, min assisted needed for some LOB Lateral hurdles, challenging, unable to perform sufficient R hip and knee flexion to clear hurdle, needed cueing and used anterior circumduction to complete hurdle with R lateral stepping 2 sets x 12 total reps, CGA, min assisted needed for some LOB  *Clinic distances ambulated without AD, CGA and SBA based on level of fatigue. Seated rest breaks throughout due to fatigue     PATIENT EDUCATION: Education details: See above education Person educated: Patient Education method: Explanation and Verbal cues Education comprehension: verbalized understanding and needs further education   HOME EXERCISE PROGRAM:   HEP access code: [email or web address]    - Standing calf  raises at counter x 10 reps - Standing single leg stance at counter x accumulate 30 seconds each side - Sit to stand with minimal arm push off (1 arm push off practiced in clinic) x 5-8 reps       GOALS: Goals reviewed with patient? Yes   STG = LTG: Target date: 06/15/2024   Pt will be independent with final HEP for strength, gait, balance in order to build upon functional  gains made in therapy. Baseline: TBA Goal status: INITIAL   2.  Patient will improve ABC scale from 40.6% confidence to at least 60% confidence in order to demonstrate decreased fear of falling. Baseline: 40.6%  Goal status: INITIAL   3. Pt will improve 5x sit<>stand to less than or equal to 32 sec to demonstrate improved functional strength and transfer efficiency.  Baseline: 39.13 seconds UE support Goal status: INITIAL   4. Pt will improve gait speed from 2.2 ft/sec with SPC to at least 2.5 ft/sec with LRAD. Baseline: 14.93 seconds, 2.2 ft/second using SPC and SBA Goal status: INITIAL   5. Pt will improve TUG time less than 15 seconds in order to demo decrease fall risk. Baseline: 19.62 seconds, SPC Goal status: INITIAL   6. Pt will improve FGA from 15/30 to at least 20/30 with LRAD in order to demo decrease fall risk. Baseline: 15/30, SPC Goal status: INITIAL     ASSESSMENT:   CLINICAL IMPRESSION: Patient seen for skilled PT session with emphasis on dynamic balance and neuromuscular reeducation to reduce risk of falls and improve LE function. Patient reported more fatigue today and states he has been feeling this way since about a year ago when all the heart stuff started. Vitals were WNL and no other signs or symptoms present, he denies dizziness. Patient was challenged today with foam balance beam side stepping and tandem walking. Ball throwing was used to encourage less use of UE during tasks today. Pt had a hard time releasing hands from bar which he used for balance. He was challenged by side stepping over lateral hurdles and displayed decreased hip flexion and right leg circumduction anteriorly to clear hurdle instead of increasing right hip and knee flexion. Patient was given cues throughout but needs continuous cueing to implement less UE assistance and clearing obstacles. All intervention was well tolerated with seated rest breaks throughout as needed. Patient continues to  benefit from skilled PT to address impairments as noted and progress towards long term goals. Will continue per POC.  OBJECTIVE IMPAIRMENTS: Abnormal gait, decreased activity tolerance, decreased balance, decreased cognition, decreased coordination, decreased endurance, decreased mobility, difficulty walking, decreased strength, decreased safety awareness, improper body mechanics, postural dysfunction, and pain.    ACTIVITY LIMITATIONS: carrying, lifting, bending, sitting, standing, squatting, stairs, bathing, toileting, dressing, reach over head, and locomotion level   PARTICIPATION LIMITATIONS: meal prep, cleaning, laundry, driving, shopping, community activity, and occupation   PERSONAL FACTORS: Age, Fitness, Past/current experiences, Profession, Sex, and 3+ comorbidities: migraines, sleep apnea, stroke, mitral valve clip, osteopenia, RBBB, ascending aorta dilation, post coronary artery stent placement, afib, diabetes, mild fusiform iliac aneurysm, dementia (mentioned in 2020 notes), CAD, GERD, HF with improved EF, hypertensive heart disease, chronic kidney disease, 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 are also affecting patient's functional outcome.    REHAB POTENTIAL: Good   CLINICAL DECISION MAKING: Evolving/moderate complexity   EVALUATION COMPLEXITY: Moderate     PLAN:   PT FREQUENCY: 2x/week  PT DURATION: 4 weeks   PLANNED INTERVENTIONS: [id number]- PT Re-evaluation, 97110-Therapeutic exercises, 97530- Therapeutic activity, [id number]- Neuromuscular re-education, 97535- Self Care, [id number]- Manual therapy, [phone]- Gait training, [phone]- Orthotic Initial, [phone]- Orthotic/Prosthetic subsequent, [phone]- Canalith repositioning, Patient/Family education, Balance training, Stair training, Vestibular training, Visual/preceptual remediation/compensation, Cognitive remediation, and DME instructions   PLAN  FOR NEXT SESSION:  Pt should continue with LE strengthening and dynamic balance tasks especially with EC conditions; any proximal strengthening  Sit to stand with foam, ball slam with foam, banded sit to stand, banded walking with perturbations, foam balance and blazepod hits, taps to steps with ankle weights.   Add to HEP I.e. standing marching and standing hip abduction  Continue balance beam with ball throw and hurdles  Emmalene Sherry, SPT

## 2024-06-08 ENCOUNTER — Ambulatory Visit

## 2024-06-08 VITALS — BP 157/76 | HR 78

## 2024-06-08 DIAGNOSIS — M6281 Muscle weakness (generalized): Secondary | ICD-10-CM

## 2024-06-08 DIAGNOSIS — R2681 Unsteadiness on feet: Secondary | ICD-10-CM

## 2024-06-08 DIAGNOSIS — R293 Abnormal posture: Secondary | ICD-10-CM

## 2024-06-08 DIAGNOSIS — R2689 Other abnormalities of gait and mobility: Secondary | ICD-10-CM

## 2024-06-08 NOTE — Therapy (Signed)
 OUTPATIENT PHYSICAL THERAPY VESTIBULAR TREATMENT         Patient Name: [name] MRN: [id number] DOB:02/12/1935, 89 y.o., male Today's Date: 05/21/2024   END OF SESSION:  PT End of Session - 06/08/24 1359     Visit Number 6    Number of Visits 9    Date for Recertification  06/15/24    Authorization Type [employer] MEDICARE    PT Start Time 1400    PT Stop Time 1443    PT Time Calculation (min) 43 min    Equipment Utilized During Treatment Gait belt    Activity Tolerance Patient tolerated treatment well    Behavior During Therapy WFL for tasks assessed/performed                   Past Medical History:  Diagnosis Date   (HFimpEF) heart failure with improved EF      Echo 04/2019: EF 35-40 // Echo 5/22: EF 55-60, no RWMA, GR 1 DD, moderate to severe MR, myxomatous mitral valve, similar to prior echocardiogram, mildly elevated PASP, RVSP 35.9, mild AI, mild AV sclerosis without AS, mild dilation of ascending aorta (43 mm)   A-fib (HCC)      RVR-DCCV   Abnormal PSA 01/17/2012    10.38/4/5/ PSA:2013=6.96/ PSA 07/08/2011=6.26/ PSA 2006=8.6   Ascending aorta dilation 09/16/2021    Echocardiogram March 2023: 41 mm   Blood transfusion without reported diagnosis      during vagotomy 1976   CAD (coronary artery disease)     Chronic kidney disease      nephrolithiasis right kidney   Coronary artery disease     ED (erectile dysfunction)     GERD (gastroesophageal reflux disease)      PUD S/P PARTIAL VAGOTOMY   Gynecomastia 04/21/2011    seen by Dr.Murray, no rad tx   H/O impacted cerumen      S/P ENT DR BATES   History of echocardiogram      echo 4/16:  mild LVH, EF 55-60%, Gr 1 DD, no RWMA, mild AI, mild MR, mod LAE, mild TR   Hydroureteronephrosis      right    Hyperlipidemia     Hypertension     Insomnia     Migraines      WITH AURA   Nephrolithiasis     Osteopenia     Overweight     Prostate cancer (HCC) 12/09/03 dx    Prostate ca/adenocarcinoma,gleason=3+3=6    pSA  4.6   RBBB (right bundle branch block)     Rosacea     S/P mitral valve clip implantation 12/22/2023    s/p Mitraclip NTW x 2 with a reduction from 3-4+ MR to 1 + residual MR.   Sinus bradycardia     Sleep apnea      pt unaware, no sleep study   Stroke [facility])      asymptomatic, found on head imaging             Past Surgical History:  Procedure Laterality Date   ABDOMINAL AORTIC ENDOVASCULAR STENT GRAFT Right 10/28/2021    Procedure: ABDOMINAL AORTIC ENDOVASCULAR GRAFT WITH RIGHT EPIGASTRIC ARTERY COILING;  Surgeon: Magda Debby SAILOR, MD;  Location: [facility] OR;  Service: Vascular;  Laterality: Right;   CARDIAC CATHETERIZATION       CARDIOVERSION N/A 06/19/2018    Procedure: CARDIOVERSION;  Surgeon: Rolan Ezra RAMAN, MD;  Location: [facility] ENDOSCOPY;  Service: Cardiovascular;  Laterality: N/A;   PROSTATE BIOPSY  12/09/2003    adenocarcinoma,glerason: 3+3=6   RIGHT HEART CATH AND CORONARY ANGIOGRAPHY N/A 11/14/2023    Procedure: RIGHT HEART CATH AND CORONARY ANGIOGRAPHY;  Surgeon: Wonda Sharper, MD;  Location: [facility] INVASIVE CV LAB;  Service: Cardiovascular;  Laterality: N/A;   RIGHT/LEFT HEART CATH AND CORONARY ANGIOGRAPHY N/A 04/25/2019    Procedure: RIGHT/LEFT HEART CATH AND CORONARY ANGIOGRAPHY;  Surgeon: Anner Alm ORN, MD;  Location: [facility] INVASIVE CV LAB;  Service: Cardiovascular;  Laterality: N/A;   TEE WITHOUT CARDIOVERSION N/A 06/19/2018    Procedure: TRANSESOPHAGEAL ECHOCARDIOGRAM (TEE);  Surgeon: Rolan Ezra RAMAN, MD;  Location: [facility] ENDOSCOPY;  Service: Cardiovascular;  Laterality: N/A;   TRANSCATHETER MITRAL EDGE TO EDGE REPAIR N/A 12/21/2023    Procedure: TRANSCATHETER MITRAL EDGE TO EDGE REPAIR;  Surgeon: Wonda Sharper, MD;  Location: [facility] INVASIVE CV LAB;  Service: Cardiovascular;  Laterality: N/A;   TRANSESOPHAGEAL ECHOCARDIOGRAM (CATH LAB) N/A 10/14/2023    Procedure: TRANSESOPHAGEAL ECHOCARDIOGRAM;  Surgeon: Francyne Headland, MD;  Location: [facility] INVASIVE CV LAB;  Service:  Cardiovascular;  Laterality: N/A;   TRANSESOPHAGEAL ECHOCARDIOGRAM (CATH LAB) N/A 12/21/2023    Procedure: TRANSESOPHAGEAL ECHOCARDIOGRAM;  Surgeon: Wonda Sharper, MD;  Location: [facility] INVASIVE CV LAB;  Service: Cardiovascular;  Laterality: N/A;   VAGOTOMY   07/05/1976    partial            Patient Active Problem List    Diagnosis Date Noted   S/P mitral valve clip implantation 12/22/2023   Overweight     Severe mitral insufficiency 12/21/2023   Abdominal aortic aneurysm 10/28/2021   Heart failure with improved ejection fraction (HFimpEF) (HCC) 09/16/2021   Coronary artery disease involving native coronary artery of native heart without angina pectoris 09/16/2021   Ascending aorta dilation 09/16/2021   Acute on chronic combined systolic and diastolic CHF (congestive heart failure) (HCC) 08/10/2018   Atrial fibrillation (HCC) 06/14/2018   PAH (pulmonary artery hypertension) (HCC) 06/13/2018   Essential hypertension 06/13/2018   HLD (hyperlipidemia) 07/20/2015   Diabetes mellitus without complication (HCC) 07/20/2015   RBBB 10/22/2014   Stage 3a chronic kidney disease (HCC) 10/20/2014   Hydroureteronephrosis     Hx of Prostate cancer     Abnormal PSA 01/17/2012      PCP: Onita Rush, MD REFERRING PROVIDER: Verta Izetta HERO, NP   REFERRING DIAG: R26.89 (ICD-10-CM) - Other abnormalities of gait and mobility    THERAPY DIAG:  Unsteadiness on feet   Muscle weakness (generalized)   Abnormal posture   History of falling   ONSET DATE: 05/09/2024   Rationale for Evaluation and Treatment: Rehabilitation   SUBJECTIVE:    SUBJECTIVE STATEMENT:  Patient ambulates into clinic using SPC. He states feeling more tired these days but that this has been going on for the last year after the heart stuff started. He only had eggs today and says that time is his biggest barrier to not eating enough throughout the day because he stays in bed until late and doesn't have time to eat. He said  he will have a proper meal for dinner. He went to [facility] for thanksgiving and stated he didn't have fun because the found out the house he will be moving to with his daughter doesn't have railings and that his room is in the basement which doesn't have railings either. He said that his daughter is going to add railings to the home. Patient denies falls.   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   PRECAUTIONS: Fall and Other: osteopenia, afib   PATIENT GOALS: Wants to feel confident when he walks and wants his legs to feel stronger. He wants to be stronger going up steps and not feel like he is always going to trip over his feet.   OBJECTIVE:  Note: Objective measures were completed at Evaluation unless otherwise noted.   DIAGNOSTIC FINDINGS: No recent brain or cervical imaging available. Most recent is 10/31/2018: IMPRESSION: 1. No acute abnormality to explain syncopal episode. 2. Remote lacunar infarcts involving the right centrum semi ovale and the left basal ganglia. 3. Moderate atrophy and white matter disease likely reflects the sequela of chronic microvascular ischemia. 4. Multiple punctate foci of susceptibility consistent with remote foci of punctate hemorrhage suggest an underlying vasculitis. No acute hemorrhage is present.                                                                                                                      TREATMENT  Vitals:   06/08/24 1407 06/08/24 1429  BP: (!)  167/94 (!) 157/76  Pulse: 81 78   Theract: -standing on foam balance beam in front of ballet bar blaze pods   -4 on mirror, 1 on floor  -3x 1.39mins   -consistent U UE support on bars needed  -standing trunk rotation pulling subsequent hula hoop forward + step into hula hop  -sit <> stand zoom ball for incr UE power + immediate standing balance  -115' carrying surge with mild truncal instability noted  -needed CGA for steadying     PATIENT EDUCATION: Education details: Continue HEP  Person educated: Patient Education method: Explanation and Verbal cues Education comprehension: verbalized understanding and needs further education   HOME EXERCISE PROGRAM:   HEP access code: [phone]    - Standing calf raises at counter x 10 reps - Standing single leg stance at counter x accumulate 30 seconds each side - Sit to stand with minimal arm push off (1 arm push off practiced in clinic) x 5-8 reps       GOALS: Goals reviewed with patient? Yes   STG = LTG: Target date: 06/15/2024   Pt will be independent with final HEP for strength, gait, balance in order to build upon functional gains made in therapy. Baseline: TBA Goal status: INITIAL   2.  Patient will improve ABC scale from 40.6% confidence to at least 60% confidence in order to demonstrate decreased fear of falling. Baseline: 40.6%  Goal status: INITIAL   3. Pt will improve 5x sit<>stand to less than or equal to 32 sec to demonstrate improved functional strength and transfer efficiency.  Baseline: 39.13  seconds UE support Goal status: INITIAL   4. Pt will improve gait speed from 2.2 ft/sec with SPC to at least 2.5 ft/sec with LRAD. Baseline: 14.93 seconds, 2.2 ft/second using SPC and SBA Goal status: INITIAL   5. Pt will improve TUG time less than 15 seconds in order to demo decrease fall risk. Baseline: 19.62 seconds, SPC Goal status: INITIAL   6. Pt will improve FGA from 15/30 to at least 20/30 with LRAD in order to  demo decrease fall risk. Baseline: 15/30, SPC Goal status: INITIAL     ASSESSMENT:   CLINICAL IMPRESSION: Patient seen for skilled PT session with emphasis on progressing balance challenges. Notes difficulty with basic ADLs and IADLs within his home. Hula Hoop task targeting trunk twist with NBOS + stepping over low threshold. Notes difficulty with folding laundry while standing (large sheets) and carrying basket upstairs. Mild instability with Surge, but able to self correct with only CGA for steadying. Continue POC.   OBJECTIVE IMPAIRMENTS: Abnormal gait, decreased activity tolerance, decreased balance, decreased cognition, decreased coordination, decreased endurance, decreased mobility, difficulty walking, decreased strength, decreased safety awareness, improper body mechanics, postural dysfunction, and pain.    ACTIVITY LIMITATIONS: carrying, lifting, bending, sitting, standing, squatting, stairs, bathing, toileting, dressing, reach over head, and locomotion level   PARTICIPATION LIMITATIONS: meal prep, cleaning, laundry, driving, shopping, community activity, and occupation   PERSONAL FACTORS: Age, Fitness, Past/current experiences, Profession, Sex, and 3+ comorbidities: migraines, sleep apnea, stroke, mitral valve clip, osteopenia, RBBB, ascending aorta dilation, post coronary artery stent placement, afib, diabetes, mild fusiform iliac aneurysm, dementia (mentioned in 2020 notes), CAD, GERD, HF with improved EF, hypertensive heart disease, chronic kidney disease, 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 are also affecting patient's functional outcome.    REHAB POTENTIAL: Good   CLINICAL DECISION MAKING: Evolving/moderate complexity   EVALUATION COMPLEXITY: Moderate     PLAN:   PT FREQUENCY: 2x/week   PT DURATION: 4 weeks   PLANNED INTERVENTIONS: [id number]- PT Re-evaluation,  97110-Therapeutic exercises, 97530- Therapeutic activity, 97112- Neuromuscular re-education, 97535- Self Care, [id number]- Manual therapy, [phone]- Gait training, [phone]- Orthotic Initial, [phone]- Orthotic/Prosthetic subsequent, [phone]- Canalith repositioning, Patient/Family education, Balance training, Stair training, Vestibular training, Visual/preceptual remediation/compensation, Cognitive remediation, and DME instructions   PLAN FOR NEXT SESSION:  Pt should continue with LE strengthening and dynamic balance tasks especially with EC conditions; any proximal strengthening  Sit to stand with foam, ball slam with foam, banded sit to stand, banded walking with perturbations, foam balance and blazepod hits, taps to steps with ankle weights.  -wants to practice tasks simulating bringing trash cans up from curb (I'm thinking dragging Stedy behind him with mild weight added)   Delon DELENA Pop, PT, DPT, CBIS, CFVRS

## 2024-06-11 ENCOUNTER — Other Ambulatory Visit: Payer: Self-pay

## 2024-06-11 ENCOUNTER — Ambulatory Visit: Attending: Cardiology | Admitting: Cardiovascular Disease

## 2024-06-11 ENCOUNTER — Encounter: Payer: Self-pay | Admitting: Cardiovascular Disease

## 2024-06-11 VITALS — BP 155/93 | HR 75 | Ht 72.0 in | Wt 241.8 lb

## 2024-06-11 DIAGNOSIS — I5022 Chronic systolic (congestive) heart failure: Secondary | ICD-10-CM | POA: Diagnosis not present

## 2024-06-11 DIAGNOSIS — Z95818 Presence of other cardiac implants and grafts: Secondary | ICD-10-CM

## 2024-06-11 DIAGNOSIS — I480 Paroxysmal atrial fibrillation: Secondary | ICD-10-CM | POA: Diagnosis not present

## 2024-06-11 DIAGNOSIS — I2510 Atherosclerotic heart disease of native coronary artery without angina pectoris: Secondary | ICD-10-CM

## 2024-06-11 DIAGNOSIS — I10 Essential (primary) hypertension: Secondary | ICD-10-CM

## 2024-06-11 DIAGNOSIS — Z9889 Other specified postprocedural states: Secondary | ICD-10-CM

## 2024-06-11 MED ORDER — CARVEDILOL 3.125 MG PO TABS: 3.1250 mg | ORAL_TABLET | Freq: Two times a day (BID) | ORAL | 3 refills | Status: AC

## 2024-06-11 MED ORDER — APIXABAN 5 MG PO TABS: 5.0000 mg | ORAL_TABLET | Freq: Two times a day (BID) | ORAL | 1 refills | Status: AC

## 2024-06-11 MED ORDER — SACUBITRIL-VALSARTAN 24-26 MG PO TABS: 1.0000 | ORAL_TABLET | Freq: Two times a day (BID) | ORAL | 3 refills | Status: AC

## 2024-06-11 MED ORDER — FUROSEMIDE 20 MG PO TABS: 40.0000 mg | ORAL_TABLET | Freq: Every day | ORAL | 3 refills | Status: DC

## 2024-06-11 NOTE — Assessment & Plan Note (Signed)
 No angina. Continue carvedilol .

## 2024-06-11 NOTE — Assessment & Plan Note (Addendum)
 Echo as above, mild residual MR, mean gradient 4 mmHg.  Should have an echocardiogram when he is 1 year out from this procedure (June 2026).

## 2024-06-11 NOTE — Progress Notes (Signed)
 Cardiology Office Note:    Date:  06/11/2024   ID:  [name], DOB 22-Oct-1934, MRN [id number]  PCP:  Onita Rush, MD   [facility] Providers Cardiologist:  Ozell Fell, MD Cardiology APP:  Lelon Glendia DASEN, PA-C  Electrophysiologist:  Lynwood Rakers, MD (Inactive)  Structural Heart:  Ozell Fell, MD    Referring MD: Onita Rush, MD   Chief Complaint  Patient presents with   Follow-up    Mitral Regurgitation    History of Present Illness:    [name] is a 89 y.o. 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 Most recent echo 02/02/2024 showed LVEF 40-50% and mild residual MR with a mean transmitral gradient of 4 mmHg.   The patient is here alone today. He reports that his energy level is very poor. He is moving in with his daughter in [place], [place] in the near future. He denies shortness of breath or chest pain. No lightheadedness or syncope. He mainly reports fatigue and lack of energy, feeling like he can remain in bed all day.    Current Medications: Current Meds  Medication Sig   amoxicillin  (AMOXIL ) 500 MG tablet Take 4 tablets (2,000 mg total) by mouth as directed. 1 hour prior to dental work including cleanings   ascorbic acid (VITAMIN C) 500 MG tablet Take 500 mg by mouth daily.   Carboxymethylcellulose Sodium (LUBRICANT EYE DROPS OP) Place 1 drop into both eyes daily as needed (dry eyes).   Cholecalciferol (VITAMIN D) 50 MCG (2000 UT) tablet Take 2,000 Units by mouth daily.   Cyanocobalamin (B-12) 2500 MCG TABS Take 2,500 mcg by mouth daily.   [DISCONTINUED] apixaban  (ELIQUIS ) 5 MG TABS tablet Take 1 tablet (5 mg total) by mouth 2 (two) times daily.    [DISCONTINUED] carvedilol  (COREG ) 3.125 MG tablet Take 1 tablet (3.125 mg total) by mouth 2 (two) times daily with a meal.   [DISCONTINUED] furosemide  (LASIX ) 20 MG tablet Take 2 tablets (40 mg total) by mouth daily.   [DISCONTINUED] sacubitril -valsartan  (ENTRESTO ) 24-26 MG TAKE 1 TABLET BY MOUTH TWICE DAILY     Allergies:   Aspirin    ROS:   Please see the history of present illness.    All other systems reviewed and are negative.  EKGs/Labs/Other Studies Reviewed:    The following studies were reviewed today: Cardiac Studies & Procedures   ______________________________________________________________________________________________ CARDIAC CATHETERIZATION  CARDIAC CATHETERIZATION 11/14/2023  Conclusion   Ost LM to Mid LM lesion is 20% stenosed.   Dist LAD lesion is 70% stenosed with 60% stenosed side branch in 2nd Diag.   Ost Cx lesion is 30% stenosed.   Prox Cx lesion is 50% stenosed.   Prox Cx to Mid Cx lesion is 35% stenosed.   1st Mrg lesion is 60% stenosed.   RPDA lesion is 60% stenosed.   Ost LAD to Prox LAD lesion is 50% stenosed.  1.  Moderate three-vessel coronary artery disease with diffuse calcification and nonobstructive stenosis of a large, dominant RCA, patency of the left main with mild nonobstructive plaque, diffusely calcified LAD with moderate proximal and mid vessel stenoses in the range of 50 to 60%, and mild to moderate circumflex/OM stenoses up to 60% 2.  Right  heart catheterization data: RA mean 7 mmHg PA pressure 51/6 with a mean of 24 mmHg Wedge pressure A-wave 24, V wave 27, mean 20 mmHg Cardiac output 4.87 L/min with cardiac index 2.1 L/min/m Transpulmonary gradient 4 mmHg with PVR of 0.8 Wood units  Plan: medical therapy for diffuse CAD, continue evaluation for mitral TEER  Findings Coronary Findings Diagnostic  Dominance: Right  Left Main The left main is patent with moderate calcification and mild nonobstructive stenosis Ost LM to Mid LM  lesion is 20% stenosed. The lesion is concentric. The lesion is mildly calcified.  Left Anterior Descending There is mild diffuse disease throughout the vessel. The vessel is calcified. Proximal third of the vessel is mild to moderately calcified The LAD is diffusely diseased with severe proximal calcification.  The ostium and proximal portions of the LAD have 50% stenosis.  The mid LAD has 60 to 70% stenosis at the second diagonal which is also diseased with moderate 60% stenosis. Ost LAD to Prox LAD lesion is 50% stenosed. Dist LAD lesion is 70% stenosed with 60% stenosed side branch in 2nd Diag. The lesion is located at the major branch and eccentric. The downstream LAD beyond this appeared to be relatively small in diameter.  Lesion does not appear to be flow-limiting  First Diagonal Branch Vessel is small in size.  Second Diagonal Branch Vessel is small in size. There is mild disease in the vessel.  Second Septal Branch Vessel is small in size.  Left Circumflex There is mild diffuse disease throughout the vessel. The circumflex is patent with moderate diffuse calcification.  The first OM has 60% diffuse stenosis.  The AV circumflex has 50% stenosis in its proximal portion just beyond the origin of the high first OM. Ost Cx lesion is 30% stenosed. Ostial The lesion is calcified. Prox Cx lesion is 50% stenosed. The lesion is focal. Prox Cx to Mid Cx lesion is 35% stenosed. The lesion is irregular.  First Obtuse Marginal Branch Vessel is small in size. There is mild disease in the vessel. 1st Mrg lesion is 60% stenosed.  Left Posterior Atrioventricular Artery Vessel is small in size.  Right Coronary Artery Vessel is large. Actually very large There is mild diffuse disease throughout the vessel. Large, dominant vessel with diffuse calcification and mild ectasia.  The PDA has scattered 50% stenoses but no severe or high-grade obstruction.  The PLA is patent.  Acute Marginal  Branch Vessel is small in size.  Right Posterior Descending Artery Vessel is large in size. Wraparound PDA RPDA lesion is 60% stenosed.  First Right Posterolateral Branch Vessel is small in size.  Intervention  No interventions have been documented.   CARDIAC CATHETERIZATION  CARDIAC CATHETERIZATION 04/25/2019  Conclusion  Hemodynamic findings consistent with mild pulmonary hypertension - mean PAP 28 mmHg  LV end diastolic pressure is mildly elevated. - LVEDP 12-15 mmHg  Cardiac output-index 7.44-3.27.  ---------------------------------  Ost LM to Mid LM lesion is 20% stenosed. Ost Cx lesion is 30% stenosed. -- calcified  1st Mrg lesion is 60% stenosed.  Dist LAD lesion is 55% stenosed with 55% stenosed side branch in 2nd Diag. - the downstream LAD is relatively the same diameter as noted in the stenosis.  Otherwise minimal to mild disease throughout.  SUMMARY  Diffuse mild to moderate CAD: Most notable 60% proximal OM1, 50 to 60% mid LAD at D2.  No obvious culprit lesion to explain significantly reduced EF.  Mild proximal LM and ostial LCx-calcified lesions.  Mild  pulmonary hypertension with mean PA pressure of 28 mmHg.  LVEDP and PCWP from 12 to 15 mmHg.  Borderline CO-CI  RECOMMENDATIONS  Return to nursing unit for ongoing care, TR band removal.  Would continue aggressive risk factor modification and heart failure medications.  Appears to be at relatively well diuresed, would titrate up afterload reduction  If he has any concerning symptoms for possible angina, would consider noninvasive evaluation with Myoview , or perhaps coronary CTA to determine presence of either anterior or lateral ischemia suggesting LAD or OM lesions are more significant than they appear angiographically.    Alm Clay, M.D., M.S. Interventional Cardiologist  Pager # [phone] Phone # [phone] [address] [place], [place] [address]  Findings Coronary  Findings Diagnostic  Dominance: Right  Left Main Ost LM to Mid LM lesion is 20% stenosed. The lesion is concentric. The lesion is mildly calcified.  Left Anterior Descending There is mild diffuse disease throughout the vessel. The vessel is calcified. Proximal third of the vessel is mild to moderately calcified Dist LAD lesion is 55% stenosed with 55% stenosed side branch in 2nd Diag. The lesion is located at the major branch and eccentric. The downstream LAD beyond this appeared to be relatively small in diameter.  Lesion does not appear to be flow-limiting  First Diagonal Branch Vessel is small in size.  Second Diagonal Branch Vessel is small in size. There is mild disease in the vessel.  Second Septal Branch Vessel is small in size.  Left Circumflex There is mild diffuse disease throughout the vessel. Ost Cx lesion is 30% stenosed. Ostial The lesion is calcified. Prox Cx lesion is 25% stenosed. The lesion is focal. Prox Cx to Mid Cx lesion is 35% stenosed. The lesion is irregular.  First Obtuse Marginal Branch Vessel is small in size. There is mild disease in the vessel. 1st Mrg lesion is 60% stenosed.  Left Posterior Atrioventricular Artery Vessel is small in size.  Right Coronary Artery Vessel is large. Actually very large The vessel exhibits minimal luminal irregularities.  Acute Marginal Branch Vessel is small in size.  Right Posterior Descending Artery Vessel is large in size. Wraparound PDA  First Right Posterolateral Branch Vessel is small in size.  Intervention  No interventions have been documented.   STRESS TESTS  MYOCARDIAL PERFUSION IMAGING 10/01/2021  Interpretation Summary   Findings are consistent with prior myocardial infarction and no peri-infarct ischemia. The study is intermediate risk due to depresesd ejection fraction.   No ST deviation was noted.   Left ventricular function is abnormal. Global function is moderately reduced. Nuclear  stress EF: 41 %. The left ventricular ejection fraction is moderately decreased (30-44%). End diastolic cavity size is moderately enlarged.   Prior study not available for comparison.   ECHOCARDIOGRAM  ECHOCARDIOGRAM COMPLETE 02/02/2024  Narrative ECHOCARDIOGRAM REPORT    Patient Name:   VIRAAJ VORNDRAN  Date of Exam: 02/02/2024 Medical Rec #:  [id number]     Height:       72.0 in Accession #:    [id number]    Weight:       240.2 lb Date of Birth:  [birth year]     BSA:          2.303 m Patient Age:    88 years      BP:           148/64 mmHg Patient Gender: M             HR:  83 bpm. Exam Location:  [address]  Procedure: 2D Echo (Both Spectral and Color Flow Doppler were utilized during procedure).  Indications:    Z98.80 mitral valve clip implantation ; I34.0 Nonrheumatic mitral (valve) insufficiency  History:        Patient has prior history of Echocardiogram examinations, most recent 12/22/2023. CHF, Stroke, Arrythmias:Atrial Fibrillation and RBBB; Risk Factors:Sleep Apnea, Hypertension, Diabetes and Dyslipidemia. NICM. Dilated ascending aorta. Pulmonary hypertension.  Sonographer:    Jon Hacker RCS Referring Phys: [id number] KATHRYN R THOMPSON  IMPRESSIONS   1. LVEF variable due to arrhythmia, 40-50%. The left ventricle has mildly decreased function. The left ventricle demonstrates global hypokinesis. The left ventricular internal cavity size was mildly dilated. There is mild concentric left ventricular hypertrophy. Left ventricular diastolic function could not be evaluated. 2. Right ventricular systolic function is normal. The right ventricular size is normal. 3. The mitral valve has been repaired/replaced. Mild mitral valve regurgitation. No evidence of mitral stenosis. The mean mitral valve gradient is 4.0 mmHg. 4. The aortic valve is tricuspid. There is mild calcification of the aortic valve. Aortic valve regurgitation is mild. No aortic stenosis is present. 5.  The inferior vena cava is normal in size with greater than 50% respiratory variability, suggesting right atrial pressure of 3 mmHg.  FINDINGS Left Ventricle: LVEF variable due to arrhythmia, 40-50%. The left ventricle has mildly decreased function. The left ventricle demonstrates global hypokinesis. The left ventricular internal cavity size was mildly dilated. There is mild concentric left ventricular hypertrophy. Left ventricular diastolic function could not be evaluated due to mitral valve repair. Left ventricular diastolic function could not be evaluated.  Right Ventricle: The right ventricular size is normal. No increase in right ventricular wall thickness. Right ventricular systolic function is normal.  Left Atrium: Left atrial size was normal in size.  Right Atrium: Right atrial size was normal in size.  Pericardium: There is no evidence of pericardial effusion.  Mitral Valve: The mitral valve has been repaired/replaced. Mild mitral valve regurgitation. There is a Mitra-Clip present in the mitral position. No evidence of mitral valve stenosis. MV peak gradient, 5.9 mmHg. The mean mitral valve gradient is 4.0 mmHg.  Tricuspid Valve: The tricuspid valve is normal in structure. Tricuspid valve regurgitation is mild . No evidence of tricuspid stenosis.  Aortic Valve: The aortic valve is tricuspid. There is mild calcification of the aortic valve. Aortic valve regurgitation is mild. No aortic stenosis is present.  Pulmonic Valve: The pulmonic valve was normal in structure. Pulmonic valve regurgitation is not visualized. No evidence of pulmonic stenosis.  Aorta: The aortic root and ascending aorta are structurally normal, with no evidence of dilitation.  Venous: The inferior vena cava is normal in size with greater than 50% respiratory variability, suggesting right atrial pressure of 3 mmHg.  IAS/Shunts: No atrial level shunt detected by color flow Doppler.   LEFT VENTRICLE PLAX  2D LVIDd:         6.23 cm LVIDs:         4.97 cm LV PW:         1.21 cm LV IVS:        1.30 cm LVOT diam:     2.00 cm LV SV:         52 LV SV Index:   22 LVOT Area:     3.14 cm   RIGHT VENTRICLE RV Basal diam:  2.60 cm RV S prime:     13.50 cm/s TAPSE (M-mode): 1.6 cm  LEFT ATRIUM             Index        RIGHT ATRIUM           Index LA diam:        5.60 cm 2.43 cm/m   RA Area:     13.90 cm LA Vol (A2C):   81.1 ml 35.21 ml/m  RA Volume:   26.60 ml  11.55 ml/m LA Vol (A4C):   51.6 ml 22.41 ml/m LA Biplane Vol: 67.1 ml 29.14 ml/m AORTIC VALVE LVOT Vmax:   88.40 cm/s LVOT Vmean:  55.900 cm/s LVOT VTI:    0.164 m  AORTA Ao Root diam: 3.80 cm Ao Asc diam:  3.90 cm  MITRAL VALVE                TRICUSPID VALVE MV Area (PHT): 3.17 cm     TR Peak grad:   25.0 mmHg MV Area VTI:   1.74 cm     TR Vmax:        250.00 cm/s MV Peak grad:  5.9 mmHg MV Mean grad:  4.0 mmHg     SHUNTS MV Vmax:       1.21 m/s     Systemic VTI:  0.16 m MV Vmean:      77.8 cm/s    Systemic Diam: 2.00 cm MV Decel Time: 239 msec MV E velocity: 85.90 cm/s MV A velocity: 121.00 cm/s MV E/A ratio:  0.71  Morene Brownie Electronically signed by Morene Brownie Signature Date/Time: 02/02/2024/3:49:36 PM    Final   TEE  ECHO TEE 12/21/2023  Narrative TRANSESOPHOGEAL ECHO REPORT    Patient Name:   KERNEY HOPFENSPERGER Date of Exam: 12/21/2023 Medical Rec #:  [id number]    Height:       72.0 in Accession #:    [id number]   Weight:       239.2 lb Date of Birth:  [birth year]    BSA:          2.299 m Patient Age:    88 years     BP:           170/105 mmHg Patient Gender: M            HR:           79 bpm. Exam Location:  Inpatient  Procedure: Transesophageal Echo, 3D Echo, Color Doppler and Cardiac Doppler (Both Spectral and Color Flow Doppler were utilized during procedure).  Indications:     Mitral Regurgitation i34.0  History:         Patient has prior history of Echocardiogram examinations,  most recent 10/14/2023. Pulmonary HTN, Arrythmias:Atrial Fibrillation and RBBB; Risk Factors:Hypertension, Diabetes, Dyslipidemia and Sleep Apnea.  Sonographer:     Damien Senior RDCS Referring Phys:  [phone] Arantxa Piercey Diagnosing Phys: Maude Emmer MD   Sonographer Comments: MitraClip   PROCEDURE: After discussion of the risks and benefits of a TEE, an informed consent was obtained from the patient. The transesophogeal probe was passed without difficulty through the esophogus of the patient. Sedation performed by different physician. The patient was monitored while under deep sedation. The patient developed no complications during the procedure.  IMPRESSIONS   1. Extensive 3D imaging of the mitral valve performed to guide placement of two NT clips for mTEER. 2. Left ventricular ejection fraction, by estimation, is 55 to 60%. The left ventricle has normal function. 3. Right ventricular systolic function is normal. The right ventricular size  is normal. 4. Left atrial size was severely dilated. No left atrial/left atrial appendage thrombus was detected. 5. Right atrial size was severely dilated. 6. Pre mTEER: sever eccentric primary MR directed anteriorly. Flail P1 segment near commisural. ERO 0.59 cm2 and RV 108 cc  Post TEER: First NT clip placed laterally near commissure. Decreased MR from grade 4 to grade 2. A second NT clip was then placed medial to the first with further reduction in MR to grade one. Mean diastolic gradient after 2 nd clip only 3 mmHg. The mitral valve has been repaired/replaced. Mild mitral valve regurgitation. 7. Tricuspid valve regurgitation is mild to moderate. 8. The aortic valve is tricuspid. There is moderate calcification of the aortic valve. There is moderate thickening of the aortic valve. Aortic valve regurgitation is mild. Aortic valve sclerosis is present, with no evidence of aortic valve stenosis. 9. Aortic dilatation noted. There is mild dilatation of  the aortic root and of the ascending aorta, measuring 39 mm. 10. 3D performed of the mitral valve and demonstrates 3D mitral valve to guide mTEER. 11. Only left to right shunt post trans septal puncture.  FINDINGS Left Ventricle: Left ventricular ejection fraction, by estimation, is 55 to 60%. The left ventricle has normal function. The left ventricular internal cavity size was normal in size.  Right Ventricle: The right ventricular size is normal. Right vetricular wall thickness was not assessed. Right ventricular systolic function is normal.  Left Atrium: Left atrial size was severely dilated. No left atrial/left atrial appendage thrombus was detected.  Right Atrium: Right atrial size was severely dilated.  Pericardium: Trivial pericardial effusion is present. The pericardial effusion is anterior to the right ventricle.  Mitral Valve: Pre mTEER: sever eccentric primary MR directed anteriorly. Flail P1 segment near commisural. ERO 0.59 cm2 and RV 108 cc  Post TEER: First NT clip placed laterally near commissure. Decreased MR from grade 4 to grade 2. A second NT clip was then placed medial to the first with further reduction in MR to grade one. Mean diastolic gradient after 2 nd clip only 3 mmHg. The mitral valve has been repaired/replaced. Mild mitral valve regurgitation. MV peak gradient, 1.7 mmHg. The mean mitral valve gradient is 1.0 mmHg.  Tricuspid Valve: The tricuspid valve is normal in structure. Tricuspid valve regurgitation is mild to moderate.  Aortic Valve: The aortic valve is tricuspid. There is moderate calcification of the aortic valve. There is moderate thickening of the aortic valve. Aortic valve regurgitation is mild. Aortic valve sclerosis is present, with no evidence of aortic valve stenosis.  Pulmonic Valve: The pulmonic valve was normal in structure. Pulmonic valve regurgitation is mild.  Aorta: Aortic dilatation noted. There is mild dilatation of the aortic root and  of the ascending aorta, measuring 39 mm.  IAS/Shunts: Only left to right shunt post trans septal puncture.  Additional Comments: Extensive 3D imaging of the mitral valve performed to guide placement of two NT clips for mTEER. 3D was performed not requiring image post processing on an independent workstation and was abnormal. Spectral Doppler performed.  MITRAL VALVE MV Peak grad: 1.7 mmHg MV Mean grad: 1.0 mmHg MV Vmax:      0.64 m/s MV Vmean:     39.1 cm/s MR Peak grad:    96.8 mmHg MR Mean grad:    59.0 mmHg MR Vmax:         492.00 cm/s MR Vmean:        365.0 cm/s MR PISA:  7.60 cm MR PISA Eff ROA: 59 mm MR PISA Radius:  1.10 cm  Maude Emmer MD Electronically signed by Maude Emmer MD Signature Date/Time: 12/21/2023/5:55:26 PM    Final  MONITORS  LONG TERM MONITOR (3-14 DAYS) 07/09/2020  Narrative Patch Wear Time:  3 days and 1 hours (2021-12-22T17:11:14-0500 to 2021-12-25T18:51:40-0500)  Patient had a min HR of 45 bpm, max HR of 182 bpm, and avg HR of 68 bpm. Predominant underlying rhythm was Sinus Rhythm. Bundle Branch Block/IVCD was present. 1 run of Ventricular Tachycardia occurred lasting 4 beats with a max rate of 130 bpm (avg 114 bpm). 14 Supraventricular Tachycardia runs occurred, the run with the fastest interval lasting 5 beats with a max rate of 182 bpm, the longest lasting 20 beats with an avg rate of 105 bpm. Idioventricular Rhythm was present. Isolated SVEs were occasional (2.9%, 8418), SVE Couplets were rare (<1.0%, 765), and SVE Triplets were rare (<1.0%, 152). Isolated VEs were occasional (1.8%, 5298), VE Couplets were rare (<1.0%, 103), and VE Triplets were rare (<1.0%, 9). Ventricular Bigeminy and Trigeminy were Present.  Study reviewed: sinus rhythm with average HR 68 bpm, occasional PVC's, rare supraventricular beats. There are rare supraventricular runs, but HR only mildly increased at 105 bpm during the longest run.        ______________________________________________________________________________________________      EKG:        Recent Labs: 12/13/2023: ALT 9; NT-Pro BNP 841 12/22/2023: Hemoglobin 13.1; Platelets 146 12/29/2023: BNP 391.7 01/16/2024: BUN 20; Creatinine, Ser 1.20; Potassium 4.6; Sodium 142  Recent Lipid Panel    Component Value Date/Time   CHOL 213 (H) 11/15/2022 1329   TRIG 215 (H) 11/15/2022 1329   HDL 42 11/15/2022 1329   CHOLHDL 5.1 (H) 11/15/2022 1329   CHOLHDL 3.8 04/22/2019 1759   VLDL 24 04/22/2019 1759   LDLCALC 133 (H) 11/15/2022 1329     Risk Assessment/Calculations:    CHA2DS2-VASc Score = 7   This indicates a 11.2% annual risk of stroke. The patient's score is based upon: CHF History: 1 HTN History: 1 Diabetes History: 1 Stroke History: 2 Vascular Disease History: 0 Age Score: 2 Gender Score: 0            Physical Exam:    VS:  BP (!) 155/93   Pulse 75   Ht 6' (1.829 m)   Wt 241 lb 12.8 oz (109.7 kg)   SpO2 96%   BMI 32.79 kg/m     Wt Readings from Last 3 Encounters:  06/11/24 241 lb 12.8 oz (109.7 kg)  05/07/24 232 lb 9.4 oz (105.5 kg)  02/16/24 235 lb 3.7 oz (106.7 kg)     GEN:  Well nourished, well developed in no acute distress HEENT: Normal NECK: No JVD; No carotid bruits LYMPHATICS: No lymphadenopathy CARDIAC: Irregular, no murmurs, rubs, gallops RESPIRATORY:  Clear to auscultation without rales, wheezing or rhonchi  ABDOMEN: Soft, non-tender, non-distended MUSCULOSKELETAL:  No edema; No deformity  SKIN: Warm and dry NEUROLOGIC:  Alert and oriented x 3 PSYCHIATRIC:  Normal affect   Assessment & Plan S/P mitral valve clip implantation Echo as above, mild residual MR, mean gradient 4 mmHg.  Should have an echocardiogram when he is 1 year out from this procedure (June 2026). Chronic heart failure with mildly reduced ejection fraction (HFmrEF, 41-49%) (HCC) Continue carvedilol  and Entresto  at current doses.  Continue  furosemide .  No evidence of volume overload on his exam. Paroxysmal atrial fibrillation (HCC) Continue apixaban  for oral anticoagulation.  Essential hypertension BP elevated today in the office, but hesitant to increase his medical therapy as his primary complaint is fatigue/weakness. Reviewed recent office notes with readings ranging from 128-163/62-93. At his advanced age will continue current Rx. States he took meds just a few minutes ago.  Coronary artery disease involving native coronary artery of native heart without angina pectoris No angina. Continue carvedilol .   The patient is moving to [place], [place] .  Advised that if he needs anything in the future, he should not hesitate to contact me.  I'd be happy to help him however I can.  I wrote refills so that he has medication for the next year while he is transitioning.          Medication Adjustments/Labs and Tests Ordered: Current medicines are reviewed at length with the patient today.  Concerns regarding medicines are outlined above.  No orders of the defined types were placed in this encounter.  Meds ordered this encounter  Medications   carvedilol  (COREG ) 3.125 MG tablet    Sig: Take 1 tablet (3.125 mg total) by mouth 2 (two) times daily with a meal.    Dispense:  180 tablet    Refill:  3   furosemide  (LASIX ) 20 MG tablet    Sig: Take 2 tablets (40 mg total) by mouth daily.    Dispense:  90 tablet    Refill:  3   sacubitril -valsartan  (ENTRESTO ) 24-26 MG    Sig: Take 1 tablet by mouth 2 (two) times daily.    Dispense:  180 tablet    Refill:  3    Patient Instructions  Medication Instructions:  Your physician recommends that you continue on your current medications as directed. Please refer to the Current Medication list given to you today.  *If you need a refill on your cardiac medications before your next appointment, please call your pharmacy*   Follow-Up: At [facility], you and your health  needs are our priority.  As part of our continuing mission to provide you with exceptional heart care, our providers are all part of one team.  This team includes your primary Cardiologist (physician) and Advanced Practice Providers or APPs (Physician Assistants and Nurse Practitioners) who all work together to provide you with the care you need, when you need it.  Your next appointment:    As needed   Provider:   Ozell Fell, MD     Signed, Ozell Fell, MD  06/11/2024 1:05 PM    [facility]

## 2024-06-11 NOTE — Assessment & Plan Note (Signed)
 BP elevated today in the office, but hesitant to increase his medical therapy as his primary complaint is fatigue/weakness. Reviewed recent office notes with readings ranging from 128-163/62-93. At his advanced age will continue current Rx. States he took meds just a few minutes ago.

## 2024-06-11 NOTE — Patient Instructions (Signed)
 Medication Instructions:  Your physician recommends that you continue on your current medications as directed. Please refer to the Current Medication list given to you today.  *If you need a refill on your cardiac medications before your next appointment, please call your pharmacy*   Follow-Up: At [facility], you and your health needs are our priority.  As part of our continuing mission to provide you with exceptional heart care, our providers are all part of one team.  This team includes your primary Cardiologist (physician) and Advanced Practice Providers or APPs (Physician Assistants and Nurse Practitioners) who all work together to provide you with the care you need, when you need it.  Your next appointment:    As needed   Provider:   Ozell Fell, MD

## 2024-06-11 NOTE — Assessment & Plan Note (Addendum)
 Continue apixaban  for oral anticoagulation.

## 2024-06-12 ENCOUNTER — Ambulatory Visit: Admitting: Physical Therapy

## 2024-06-12 ENCOUNTER — Encounter: Payer: Self-pay | Admitting: Physical Therapy

## 2024-06-12 VITALS — BP 142/97 | HR 74

## 2024-06-12 DIAGNOSIS — M6281 Muscle weakness (generalized): Secondary | ICD-10-CM

## 2024-06-12 DIAGNOSIS — R2681 Unsteadiness on feet: Secondary | ICD-10-CM

## 2024-06-12 DIAGNOSIS — R2689 Other abnormalities of gait and mobility: Secondary | ICD-10-CM

## 2024-06-12 DIAGNOSIS — R293 Abnormal posture: Secondary | ICD-10-CM

## 2024-06-12 NOTE — Therapy (Signed)
 OUTPATIENT PHYSICAL THERAPY VESTIBULAR TREATMENT/DISCHARGE SUMMARY         Patient Name: [name] MRN: [id number] DOB:07-Oct-1934, 89 y.o., male Today's Date: 05/21/2024   PHYSICAL THERAPY DISCHARGE SUMMARY  Visits from Start of Care: 7  Current functional level related to goals / functional outcomes: See LTGs/Clinical Assessment Statement   Remaining deficits: Decr strength, impaired balance, decr endurance/activity tolerance, postural abnormalities    Education / Equipment: HEP   Patient agrees to discharge. Patient goals were partially met. Patient is being discharged due to being pleased with the current functional level. And pt moving to [facility] in the near future to live with his daughter    END OF SESSION:  PT End of Session - 06/12/24 1408     Visit Number 7    Number of Visits 9    Date for Recertification  06/15/24    Authorization Type [employer] MEDICARE    PT Start Time 1405   pt late to session   PT Stop Time 1450    PT Time Calculation (min) 45 min    Equipment Utilized During Treatment Gait belt    Activity Tolerance Patient tolerated treatment well    Behavior During Therapy WFL for tasks assessed/performed                   Past Medical History:  Diagnosis Date   (HFimpEF) heart failure with improved EF      Echo 04/2019: EF 35-40 // Echo 5/22: EF 55-60, no RWMA, GR 1 DD, moderate to severe MR, myxomatous mitral valve, similar to prior echocardiogram, mildly elevated PASP, RVSP 35.9, mild AI, mild AV sclerosis without AS, mild dilation of ascending aorta (43 mm)   A-fib (HCC)      RVR-DCCV   Abnormal PSA 01/17/2012    10.38/4/5/ PSA:2013=6.96/ PSA 07/08/2011=6.26/ PSA 2006=8.6   Ascending aorta dilation 09/16/2021    Echocardiogram March 2023: 41 mm   Blood transfusion without reported diagnosis      during vagotomy 1976   CAD (coronary artery disease)     Chronic kidney disease      nephrolithiasis right kidney   Coronary artery disease      ED (erectile dysfunction)     GERD (gastroesophageal reflux disease)      PUD S/P PARTIAL VAGOTOMY   Gynecomastia 04/21/2011    seen by Dr.Murray, no rad tx   H/O impacted cerumen      S/P ENT DR BATES   History of echocardiogram      echo 4/16:  mild LVH, EF 55-60%, Gr 1 DD, no RWMA, mild AI, mild MR, mod LAE, mild TR   Hydroureteronephrosis      right    Hyperlipidemia     Hypertension     Insomnia     Migraines      WITH AURA   Nephrolithiasis     Osteopenia     Overweight     Prostate cancer (HCC) 12/09/03 dx    Prostate ca/adenocarcinoma,gleason=3+3=6    pSA 4.6   RBBB (right bundle branch block)     Rosacea     S/P mitral valve clip implantation 12/22/2023    s/p Mitraclip NTW x 2 with a reduction from 3-4+ MR to 1 + residual MR.   Sinus bradycardia     Sleep apnea      pt unaware, no sleep study   Stroke [facility])      asymptomatic, found on head imaging  Past Surgical History:  Procedure Laterality Date   ABDOMINAL AORTIC ENDOVASCULAR STENT GRAFT Right 10/28/2021    Procedure: ABDOMINAL AORTIC ENDOVASCULAR GRAFT WITH RIGHT EPIGASTRIC ARTERY COILING;  Surgeon: Magda Debby SAILOR, MD;  Location: [facility] OR;  Service: Vascular;  Laterality: Right;   CARDIAC CATHETERIZATION       CARDIOVERSION N/A 06/19/2018    Procedure: CARDIOVERSION;  Surgeon: Rolan Ezra RAMAN, MD;  Location: [facility] ENDOSCOPY;  Service: Cardiovascular;  Laterality: N/A;   PROSTATE BIOPSY   12/09/2003    adenocarcinoma,glerason: 3+3=6   RIGHT HEART CATH AND CORONARY ANGIOGRAPHY N/A 11/14/2023    Procedure: RIGHT HEART CATH AND CORONARY ANGIOGRAPHY;  Surgeon: Wonda Sharper, MD;  Location: [facility] INVASIVE CV LAB;  Service: Cardiovascular;  Laterality: N/A;   RIGHT/LEFT HEART CATH AND CORONARY ANGIOGRAPHY N/A 04/25/2019    Procedure: RIGHT/LEFT HEART CATH AND CORONARY ANGIOGRAPHY;  Surgeon: Anner Alm ORN, MD;  Location: [facility] INVASIVE CV LAB;  Service: Cardiovascular;  Laterality: N/A;   TEE WITHOUT  CARDIOVERSION N/A 06/19/2018    Procedure: TRANSESOPHAGEAL ECHOCARDIOGRAM (TEE);  Surgeon: Rolan Ezra RAMAN, MD;  Location: [facility] ENDOSCOPY;  Service: Cardiovascular;  Laterality: N/A;   TRANSCATHETER MITRAL EDGE TO EDGE REPAIR N/A 12/21/2023    Procedure: TRANSCATHETER MITRAL EDGE TO EDGE REPAIR;  Surgeon: Wonda Sharper, MD;  Location: [facility] INVASIVE CV LAB;  Service: Cardiovascular;  Laterality: N/A;   TRANSESOPHAGEAL ECHOCARDIOGRAM (CATH LAB) N/A 10/14/2023    Procedure: TRANSESOPHAGEAL ECHOCARDIOGRAM;  Surgeon: Francyne Headland, MD;  Location: [facility] INVASIVE CV LAB;  Service: Cardiovascular;  Laterality: N/A;   TRANSESOPHAGEAL ECHOCARDIOGRAM (CATH LAB) N/A 12/21/2023    Procedure: TRANSESOPHAGEAL ECHOCARDIOGRAM;  Surgeon: Wonda Sharper, MD;  Location: [facility] INVASIVE CV LAB;  Service: Cardiovascular;  Laterality: N/A;   VAGOTOMY   07/05/1976    partial            Patient Active Problem List    Diagnosis Date Noted   S/P mitral valve clip implantation 12/22/2023   Overweight     Severe mitral insufficiency 12/21/2023   Abdominal aortic aneurysm 10/28/2021   Heart failure with improved ejection fraction (HFimpEF) (HCC) 09/16/2021   Coronary artery disease involving native coronary artery of native heart without angina pectoris 09/16/2021   Ascending aorta dilation 09/16/2021   Acute on chronic combined systolic and diastolic CHF (congestive heart failure) (HCC) 08/10/2018   Atrial fibrillation (HCC) 06/14/2018   PAH (pulmonary artery hypertension) (HCC) 06/13/2018   Essential hypertension 06/13/2018   HLD (hyperlipidemia) 07/20/2015   Diabetes mellitus without complication (HCC) 07/20/2015   RBBB 10/22/2014   Stage 3a chronic kidney disease (HCC) 10/20/2014   Hydroureteronephrosis     Hx of Prostate cancer     Abnormal PSA 01/17/2012      PCP: Onita Rush, MD REFERRING PROVIDER: Verta Izetta HERO, NP   REFERRING DIAG: R26.89 (ICD-10-CM) - Other abnormalities of gait and mobility     THERAPY DIAG:  Unsteadiness on feet   Muscle weakness (generalized)   Abnormal posture   History of falling   ONSET DATE: 05/09/2024   Rationale for Evaluation and Treatment: Rehabilitation   SUBJECTIVE:    SUBJECTIVE STATEMENT: Pt reports feeling more low energy today and feels like he could be in bed all day. Saw the cardiologist yesterday and mentioned this but they did not address that. Seeing his PCP at the end of the month and also planning to address ask him about this. Thinks he is moving sometime to [place] in January. Has been trying to do his exercises  at home, but notes he does not have energy for that. Wants to simulate some tasks with bringing trash cans in. Has not been able to go to the pharmacy to pick up his Entresto .   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   PRECAUTIONS: Fall and Other: osteopenia, afib   PATIENT GOALS: Wants to feel confident when he walks and wants his legs to feel stronger. He wants to be stronger going up steps and not feel like he is always going to trip over his feet.   OBJECTIVE:  Note: Objective measures were completed at Evaluation unless otherwise noted.   DIAGNOSTIC FINDINGS: No recent brain or cervical imaging available. Most recent is 10/31/2018: IMPRESSION: 1. No acute abnormality to explain syncopal episode. 2. Remote lacunar infarcts involving the right centrum semi ovale and the left basal ganglia. 3. Moderate atrophy and white matter disease likely  reflects the sequela of chronic microvascular ischemia. 4. Multiple punctate foci of susceptibility consistent with remote foci of punctate hemorrhage suggest an underlying vasculitis. No acute hemorrhage is present.                                                                                                                      TREATMENT:  Therapeutic Activity:   Vitals:   06/12/24 1419  BP: (!) 142/97  Pulse: 74   Pt reports that he needs to pick up his Entresto  medication, has not taken it since last Thursday. Pt planning on going to his pharmacy after this appt   Discussed POC going forwards - the therapist that pt was going to see on Friday no longer works at [employer], discussed pt can get that re-scheduled or D/C today with pt wanting to D/C at this time   Goal Assessment: ABC Scale: 820 = 51.25% confidence Gait speed: 13.4 seconds with no AD = 2.45 ft/sec TUG: 16.3 seconds with no AD 5x sit <> stand with BUE support: 32.4 seconds 1st attempt, 27 seconds 2nd attempt  Access Code: [email or web address] URL: [email or web address] Date: 06/12/2024 Prepared by: Sheffield Senate  Reviewed exercises below and added bolded exercises to HEP:   Exercises - Standing Single Leg Stance with Counter Support  - 1 x daily - 5 x weekly - 3 sets - 30s hold - Heel Raises with Counter Support  - 1 x daily - 5 x weekly - 3 sets - 10-12 reps - Sit to Stand with Armchair  - 2 x daily - 5 x weekly - 2 sets - 5 reps - Standing Balance with Eyes Closed  - 1 x daily - 5  x weekly - 3 sets - 30 hold - Standing Hip Abduction with Counter Support  - 1 x daily - 5 x weekly - 2 sets - 10 reps  Self-Care: At end of session, pt reporting that he has been feeling more down and depressed regarding things he can no longer do because of his age/health conditions. PT provided therapeutic listening and mentioned reaching out to PCP in regards to a referral to a therapist/counselor to further discuss  these concerns. Pt moves to [place] soon to live with daughter, so discussed try to find someone once he moves  PATIENT EDUCATION: Education details: D/C from PT, continue HEP, results of goals  Person educated: Patient Education method: Explanation and Verbal cues, Handouts Education comprehension: verbalized understanding and needs further education   HOME EXERCISE PROGRAM:   Access Code: [email or web address] URL: [email or web address] Date: 06/12/2024 Prepared by: Sheffield Senate  Exercises - Standing Single Leg Stance with Counter Support  - 1 x daily - 5 x weekly - 3 sets - 30s hold - Heel Raises with Counter Support  - 1 x daily - 5 x weekly - 3 sets - 10-12 reps - Sit to Stand with Armchair  - 2 x daily - 5 x weekly - 2 sets - 5 reps - Standing Balance with Eyes Closed  - 1 x daily - 5 x weekly - 3 sets - 30 hold - Standing Hip Abduction with Counter Support  - 1 x daily - 5 x weekly - 2 sets - 10 reps     GOALS: Goals reviewed with patient? Yes   STG = LTG: Target date: 06/15/2024   Pt will be independent with final HEP for strength, gait, balance in order to build upon functional gains made in therapy. Baseline: provided final HEP on 12/9 Goal status: MET   2.  Patient will improve ABC scale from 40.6% confidence to at least 60% confidence in order to demonstrate decreased fear of falling. Baseline: 40.6%   51.25% confidence Goal status: NOT MET   3. Pt will improve 5x sit<>stand to less than or equal to 32 sec to demonstrate improved functional strength and transfer efficiency.  Baseline: 39.13 seconds UE support  27 seconds with UE support (12/9) Goal status: MET   4. Pt will improve gait speed from 2.2 ft/sec with SPC to at least 2.5 ft/sec with LRAD. Baseline: 14.93 seconds, 2.2 ft/second using SPC and SBA  13.4 seconds with no AD = 2.45 ft/sec (12/9) Goal status: MET   5. Pt will improve TUG time less than 15 seconds in order to demo decrease fall  risk. Baseline: 19.62 seconds, SPC  16.3 seconds with no AD (12/9) Goal status: NOT MET  6. Pt will improve FGA from 15/30 to at least 20/30 with LRAD in order to demo decrease fall risk. Baseline: 15/30, SPC  Did not get to assess on 12/9 due to time constraints  Goal status: INITIAL     ASSESSMENT:   CLINICAL IMPRESSION: Pt had one more visit after today, but decided to D/C from PT today as Friday's appt would have needed to be re-scheduled and pt is moving to [place] next month to live with his daughter. Pt's BP is elevated and pt reports he has to pick up one of his heart/BP medications from the pharmacy (pt has not taken it since last Thursday). Pt planning on going to the pharmacy after session today. Today's session focused on assessing LTGs. Pt met goals for improved gait speed with  no AD and 5x sit <> stand with BUE support. Pt did improve time on TUG and score on ABC scale indicating improved confidence with gait, but did not meet goal levels. Focused on reviewing HEP with no further questions at this time. Encouraged pt to follow-up with PCP regarding feeling down and feeling like he has less energy. Pt to see his current PCP at the end of the month. Pt will be discharged from PT at this time and is in agreement with plan.   OBJECTIVE IMPAIRMENTS: Abnormal gait, decreased activity tolerance, decreased balance, decreased cognition, decreased coordination, decreased endurance, decreased mobility, difficulty walking, decreased strength, decreased safety awareness, improper body mechanics, postural dysfunction, and pain.    ACTIVITY LIMITATIONS: carrying, lifting, bending, sitting, standing, squatting, stairs, bathing, toileting, dressing, reach over head, and locomotion level   PARTICIPATION LIMITATIONS: meal prep, cleaning, laundry, driving, shopping, community activity, and occupation   PERSONAL FACTORS: Age, Fitness, Past/current experiences, Profession, Sex, and 3+  comorbidities: migraines, sleep apnea, stroke, mitral valve clip, osteopenia, RBBB, ascending aorta dilation, post coronary artery stent placement, afib, diabetes, mild fusiform iliac aneurysm, dementia (mentioned in 2020 notes), CAD, GERD, HF with improved EF, hypertensive heart disease, chronic kidney disease, 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 are also affecting patient's functional outcome.    REHAB POTENTIAL: Good   CLINICAL DECISION MAKING: Evolving/moderate complexity   EVALUATION COMPLEXITY: Moderate     PLAN:   PT FREQUENCY: 2x/week   PT DURATION: 4 weeks   PLANNED INTERVENTIONS: [id number]- PT Re-evaluation, 97110-Therapeutic exercises, 97530- Therapeutic activity, [id number]- Neuromuscular re-education, 97535- Self Care, [id number]- Manual therapy, [id number]- Gait training, [phone]- Orthotic Initial, [phone]- Orthotic/Prosthetic subsequent, [phone]- Canalith repositioning, Patient/Family education, Balance training, Stair training, Vestibular training, Visual/preceptual remediation/compensation, Cognitive remediation, and DME instructions   PLAN FOR NEXT SESSION:  D/C    Sheffield Senate, PT, DPT 06/12/24 3:02 PM

## 2024-06-15 ENCOUNTER — Ambulatory Visit

## 2024-08-07 ENCOUNTER — Other Ambulatory Visit: Payer: Self-pay | Admitting: Physician Assistant

## 2024-08-10 NOTE — Telephone Encounter (Signed)
 In accordance with refill protocols, please review and address the following requirements before this medication refill can be authorized:  Labs  Pt needs labs within 180 days within normal range

## 2024-12-20 ENCOUNTER — Other Ambulatory Visit (HOSPITAL_COMMUNITY)

## 2024-12-20 ENCOUNTER — Ambulatory Visit: Admitting: Physician Assistant
